# Patient Record
Sex: Male | Born: 1935
Health system: Southern US, Community
[De-identification: ages and names within clinical notes are randomized; demographics above are authoritative.]

## PROBLEM LIST (undated history)

## (undated) DIAGNOSIS — E785 Hyperlipidemia, unspecified: Secondary | ICD-10-CM

## (undated) DIAGNOSIS — Z87891 Personal history of nicotine dependence: Secondary | ICD-10-CM

## (undated) DIAGNOSIS — I10 Essential (primary) hypertension: Secondary | ICD-10-CM

## (undated) DIAGNOSIS — I2510 Atherosclerotic heart disease of native coronary artery without angina pectoris: Secondary | ICD-10-CM

## (undated) DIAGNOSIS — Z8719 Personal history of other diseases of the digestive system: Secondary | ICD-10-CM

## (undated) DIAGNOSIS — E663 Overweight: Secondary | ICD-10-CM

## (undated) HISTORY — DX: Hyperlipidemia, unspecified: E78.5

## (undated) HISTORY — DX: Personal history of nicotine dependence: Z87.891

## (undated) HISTORY — PX: CATARACT EXTRACTION: SUR2

## (undated) HISTORY — DX: Atherosclerotic heart disease of native coronary artery without angina pectoris: I25.10

## (undated) HISTORY — DX: Essential (primary) hypertension: I10

## (undated) HISTORY — DX: Personal history of other diseases of the digestive system: Z87.19

## (undated) HISTORY — DX: Overweight: E66.3

---

## 1989-06-23 HISTORY — PX: TENDON REPAIR: SHX5111

## 2002-09-12 HISTORY — PX: OTHER SURGICAL HISTORY: SHX169

## 2006-06-23 DIAGNOSIS — Z8719 Personal history of other diseases of the digestive system: Secondary | ICD-10-CM

## 2006-06-23 HISTORY — DX: Personal history of other diseases of the digestive system: Z87.19

## 2007-04-28 ENCOUNTER — Observation Stay (HOSPITAL_COMMUNITY): Admission: EM | Admit: 2007-04-28 | Discharge: 2007-04-29 | Payer: Self-pay | Admitting: Emergency Medicine

## 2007-04-30 ENCOUNTER — Ambulatory Visit: Payer: Self-pay | Admitting: Internal Medicine

## 2007-06-11 ENCOUNTER — Ambulatory Visit: Payer: Self-pay | Admitting: Cardiology

## 2008-06-23 HISTORY — PX: CAROTID ENDARTERECTOMY: SUR193

## 2008-07-04 ENCOUNTER — Ambulatory Visit: Payer: Self-pay | Admitting: Cardiology

## 2008-08-08 ENCOUNTER — Ambulatory Visit: Payer: Self-pay | Admitting: Vascular Surgery

## 2008-08-16 ENCOUNTER — Ambulatory Visit: Payer: Self-pay | Admitting: Vascular Surgery

## 2008-08-16 ENCOUNTER — Ambulatory Visit: Payer: Self-pay | Admitting: Internal Medicine

## 2008-08-23 ENCOUNTER — Inpatient Hospital Stay: Payer: Self-pay | Admitting: Vascular Surgery

## 2011-04-01 LAB — DIFFERENTIAL
Basophils Absolute: 0.2 — ABNORMAL HIGH
Basophils Relative: 2 — ABNORMAL HIGH
Eosinophils Relative: 0
Lymphocytes Relative: 5 — ABNORMAL LOW
Lymphs Abs: 0.6 — ABNORMAL LOW
Monocytes Absolute: 0.3
Monocytes Relative: 3

## 2011-04-01 LAB — COMPREHENSIVE METABOLIC PANEL
AST: 24
Alkaline Phosphatase: 49
Chloride: 109
GFR calc Af Amer: >60
Sodium: 142
Total Protein: 5.9 — ABNORMAL LOW

## 2011-04-01 LAB — TYPE AND SCREEN
ABO/RH(D): A NEG
Antibody Screen: NEGATIVE

## 2011-04-01 LAB — CBC
HCT: 32.7 — ABNORMAL LOW
HCT: 39.4
Hemoglobin: 11.4 — ABNORMAL LOW
Hemoglobin: 13.6
MCHC: 34.4
MCHC: 34.9
Platelets: 232
RDW: 12.4
WBC: 12.3 — ABNORMAL HIGH
WBC: 9.2

## 2011-04-01 LAB — HEMOGLOBIN AND HEMATOCRIT, BLOOD
HCT: 31.6 — ABNORMAL LOW
HCT: 37.1 — ABNORMAL LOW
Hemoglobin: 11.1 — ABNORMAL LOW
Hemoglobin: 12.8 — ABNORMAL LOW

## 2011-04-01 LAB — PROTIME-INR: INR: 1.2

## 2014-06-12 DIAGNOSIS — Z9889 Other specified postprocedural states: Secondary | ICD-10-CM | POA: Insufficient documentation

## 2014-07-06 DIAGNOSIS — Z961 Presence of intraocular lens: Secondary | ICD-10-CM | POA: Diagnosis not present

## 2014-08-21 DIAGNOSIS — H3532 Exudative age-related macular degeneration: Secondary | ICD-10-CM | POA: Diagnosis not present

## 2014-11-27 DIAGNOSIS — H3532 Exudative age-related macular degeneration: Secondary | ICD-10-CM | POA: Diagnosis not present

## 2015-01-05 DIAGNOSIS — H3532 Exudative age-related macular degeneration: Secondary | ICD-10-CM | POA: Diagnosis not present

## 2015-01-29 DIAGNOSIS — E663 Overweight: Secondary | ICD-10-CM | POA: Insufficient documentation

## 2015-01-29 DIAGNOSIS — I10 Essential (primary) hypertension: Secondary | ICD-10-CM | POA: Insufficient documentation

## 2015-01-29 DIAGNOSIS — I2510 Atherosclerotic heart disease of native coronary artery without angina pectoris: Secondary | ICD-10-CM | POA: Insufficient documentation

## 2015-02-12 ENCOUNTER — Encounter: Payer: Self-pay | Admitting: Family Medicine

## 2015-02-12 ENCOUNTER — Ambulatory Visit (INDEPENDENT_AMBULATORY_CARE_PROVIDER_SITE_OTHER): Payer: Medicare Other | Admitting: Family Medicine

## 2015-02-12 VITALS — BP 166/81 | HR 59 | Temp 97.1°F | Ht 69.5 in | Wt 175.0 lb

## 2015-02-12 DIAGNOSIS — Z7189 Other specified counseling: Secondary | ICD-10-CM | POA: Insufficient documentation

## 2015-02-12 DIAGNOSIS — W57XXXA Bitten or stung by nonvenomous insect and other nonvenomous arthropods, initial encounter: Secondary | ICD-10-CM

## 2015-02-12 DIAGNOSIS — Z5181 Encounter for therapeutic drug level monitoring: Secondary | ICD-10-CM | POA: Insufficient documentation

## 2015-02-12 DIAGNOSIS — T148 Other injury of unspecified body region: Secondary | ICD-10-CM | POA: Diagnosis not present

## 2015-02-12 DIAGNOSIS — J301 Allergic rhinitis due to pollen: Secondary | ICD-10-CM | POA: Diagnosis not present

## 2015-02-12 DIAGNOSIS — I2510 Atherosclerotic heart disease of native coronary artery without angina pectoris: Secondary | ICD-10-CM

## 2015-02-12 DIAGNOSIS — I10 Essential (primary) hypertension: Secondary | ICD-10-CM

## 2015-02-12 NOTE — Assessment & Plan Note (Signed)
Patient had stent placed in 2004, one artery blocked; on statin and aspirin; pulse today 59; sees cardiologist yearly, Dr. Darrold Junker

## 2015-02-12 NOTE — Patient Instructions (Addendum)
Keep up the good work that you are doing  We'll check some labs today and let you know those results  Try to use PLAIN allergy medicine without the decongestant if needed Avoid: phenylephrine, phenylpropanolamine, and pseudoephredine  Try the DASH guidelines  Return in 6 months for follow-up DASH Eating Plan DASH stands for "Dietary Approaches to Stop Hypertension." The DASH eating plan is a healthy eating plan that has been shown to reduce high blood pressure (hypertension). Additional health benefits may include reducing the risk of type 2 diabetes mellitus, heart disease, and stroke. The DASH eating plan may also help with weight loss. WHAT DO I NEED TO KNOW ABOUT THE DASH EATING PLAN? For the DASH eating plan, you will follow these general guidelines:  Choose foods with a percent daily value for sodium of less than 5% (as listed on the food label).  Use salt-free seasonings or herbs instead of table salt or sea salt.  Check with your health care provider or pharmacist before using salt substitutes.  Eat lower-sodium products, often labeled as "lower sodium" or "no salt added."  Eat fresh foods.  Eat more vegetables, fruits, and low-fat dairy products.  Choose whole grains. Look for the word "whole" as the first word in the ingredient list.  Choose fish and skinless chicken or [place] more often than red meat. Limit fish, poultry, and meat to 6 oz (170 g) each day.  Limit sweets, desserts, sugars, and sugary drinks.  Choose heart-healthy fats.  Limit cheese to 1 oz (28 g) per day.  Eat more home-cooked food and less restaurant, buffet, and fast food.  Limit fried foods.  Cook foods using methods other than frying.  Limit canned vegetables. If you do use them, rinse them well to decrease the sodium.  When eating at a restaurant, ask that your food be prepared with less salt, or no salt if possible. WHAT FOODS CAN I EAT? Seek help from a dietitian for individual calorie  needs. Grains Whole grain or whole wheat bread. Brown rice. Whole grain or whole wheat pasta. Quinoa, bulgur, and whole grain cereals. Low-sodium cereals. Corn or whole wheat flour tortillas. Whole grain cornbread. Whole grain crackers. Low-sodium crackers. Vegetables Fresh or frozen vegetables (raw, steamed, roasted, or grilled). Low-sodium or reduced-sodium tomato and vegetable juices. Low-sodium or reduced-sodium tomato sauce and paste. Low-sodium or reduced-sodium canned vegetables.  Fruits All fresh, canned (in natural juice), or frozen fruits. Meat and Other Protein Products Ground beef (85% or leaner), grass-fed beef, or beef trimmed of fat. Skinless chicken or [place]. Ground chicken or [place]. Pork trimmed of fat. All fish and seafood. Eggs. Dried beans, peas, or lentils. Unsalted nuts and seeds. Unsalted canned beans. Dairy Low-fat dairy products, such as skim or 1% milk, 2% or reduced-fat cheeses, low-fat ricotta or cottage cheese, or plain low-fat yogurt. Low-sodium or reduced-sodium cheeses. Fats and Oils Tub margarines without trans fats. Light or reduced-fat mayonnaise and salad dressings (reduced sodium). Avocado. Safflower, olive, or canola oils. Natural peanut or almond butter. Other Unsalted popcorn and pretzels. The items listed above may not be a complete list of recommended foods or beverages. Contact your dietitian for more options. WHAT FOODS ARE NOT RECOMMENDED? Grains White bread. White pasta. White rice. Refined cornbread. Bagels and croissants. Crackers that contain trans fat. Vegetables Creamed or fried vegetables. Vegetables in a cheese sauce. Regular canned vegetables. Regular canned tomato sauce and paste. Regular tomato and vegetable juices. Fruits Dried fruits. Canned fruit in light or heavy syrup.  Fruit juice. Meat and Other Protein Products Fatty cuts of meat. Ribs, chicken wings, bacon, sausage, bologna, salami, chitterlings, fatback, hot dogs, bratwurst,  and packaged luncheon meats. Salted nuts and seeds. Canned beans with salt. Dairy Whole or 2% milk, cream, half-and-half, and cream cheese. Whole-fat or sweetened yogurt. Full-fat cheeses or blue cheese. Nondairy creamers and whipped toppings. Processed cheese, cheese spreads, or cheese curds. Condiments Onion and garlic salt, seasoned salt, table salt, and sea salt. Canned and packaged gravies. Worcestershire sauce. Tartar sauce. Barbecue sauce. Teriyaki sauce. Soy sauce, including reduced sodium. Steak sauce. Fish sauce. Oyster sauce. Cocktail sauce. Horseradish. Ketchup and mustard. Meat flavorings and tenderizers. Bouillon cubes. Hot sauce. Tabasco sauce. Marinades. Taco seasonings. Relishes. Fats and Oils Butter, stick margarine, lard, shortening, ghee, and bacon fat. Coconut, palm kernel, or palm oils. Regular salad dressings. Other Pickles and olives. Salted popcorn and pretzels. The items listed above may not be a complete list of foods and beverages to avoid. Contact your dietitian for more information. WHERE CAN I FIND MORE INFORMATION? National Heart, Lung, and Blood Institute: travelstabloid.com Document Released: 05/29/2011 Document Revised: 10/24/2013 Document Reviewed: 04/13/2013 [facility] Patient Information 2015 [place], [place]. This information is not intended to replace advice given to you by your health care provider. Make sure you discuss any questions you have with your health care provider.

## 2015-02-12 NOTE — Assessment & Plan Note (Addendum)
Well-controlled at home; continue current plan; monitor BP away from doctor's office, goes up at the doctor; limit salt; follow DASH guidelines

## 2015-02-12 NOTE — Progress Notes (Signed)
BP 166/81 mmHg  Pulse 59  Temp(Src) 97.1 F (36.2 C)  Ht 5' 9.5" (1.765 m)  Wt 175 lb (79.379 kg)  BMI 25.48 kg/m2  SpO2 99%   Subjective:    Patient ID: Nicholas Gross, male    DOB: 01-18-1928, 81 y.o.   MRN: [id number]  HPI: Nicholas Gross is a 81 y.o. male  Chief Complaint  Patient presents with  . Hyperlipidemia  . Hypertension   He has allergies; doesn't take anything; allergic to pollen, [place]  High blood pressure; he checks it at home every morning; 124/68 on Saturday; usually in the 110's over 60's; he feels like it is well-controlled; does push-ups and squats every morning; he puts light salt on his food; on nothing for blood pressure  High cholesterol; on statin; eating sausage in the mornings, only meat he eats except for a steak biscuit; eating good vegetables, "about all I eat" are veggies  Coronary artery disease; sees Dr. Darrold Junker yearly; on statin, fish oil, aspirin; no chest pain; no leg edema  He has lost some muscle mass; some occasional arthritis in hips; grandfather had crippling arthritis; father had arthritis in knees and hips; sister has had both knees operated on; another sister had a knee that is worn out  Relevant past medical, surgical, family and social history reviewed and updated as indicated. Interim medical history since our last visit reviewed. Allergies and medications reviewed and updated.  Review of Systems  Constitutional: Negative for fever.  HENT: Positive for postnasal drip and rhinorrhea. Negative for nosebleeds.   Respiratory: Negative for wheezing.   Cardiovascular: Negative for chest pain and leg swelling.  Gastrointestinal: Negative for blood in stool.  Endocrine: Negative for polydipsia.  Genitourinary: Negative for hematuria.  Musculoskeletal: Positive for arthralgias.  Skin:       Bites on his arms yesterday, got into something  Neurological: Negative for tremors.  Hematological: Bruises/bleeds easily  (on aspirin and fish oil).  Psychiatric/Behavioral: Negative for sleep disturbance.   Per HPI unless specifically indicated above     Objective:    BP 166/81 mmHg  Pulse 59  Temp(Src) 97.1 F (36.2 C)  Ht 5' 9.5" (1.765 m)  Wt 175 lb (79.379 kg)  BMI 25.48 kg/m2  SpO2 99%  Wt Readings from Last 3 Encounters:  02/12/15 175 lb (79.379 kg)  06/19/14 179 lb (81.194 kg)    Physical Exam  Constitutional: He appears well-developed and well-nourished. No distress.  HENT:  Head: Normocephalic and atraumatic.  Eyes: EOM are normal. No scleral icterus.  Neck: No thyromegaly present.  Cardiovascular: Normal rate and regular rhythm.   Pulmonary/Chest: Effort normal and breath sounds normal.  Abdominal: Soft. Bowel sounds are normal. He exhibits no distension.  Musculoskeletal: He exhibits no edema.  Neurological: Coordination normal.  Skin: Skin is warm and dry. No pallor.  Multiple tiny pinpoint erythematous lesions on the arms consistent with insect bites; no drainage  Psychiatric: He has a normal mood and affect. His behavior is normal. Judgment and thought content normal.      Assessment & Plan:   Problem List Items Addressed This Visit      Cardiovascular and Mediastinum   Hypertension - Primary    Well-controlled at home; continue current plan; monitor BP away from doctor's office, goes up at the doctor; limit salt; follow DASH guidelines      CAD (coronary artery disease)    Patient had stent placed in 2004, one artery blocked;  on statin and aspirin; pulse today 59; sees cardiologist yearly, Dr. Darrold Junker      Relevant Orders   Lipid Panel w/o Chol/HDL Ratio     Respiratory   Allergic rhinitis due to pollen    Not using anything; if he does need something, avoid decongestants which can raise BP        Other   Medication monitoring encounter    Check SGPT on statin      Relevant Orders   Comprehensive metabolic panel    Other Visit Diagnoses    Multiple  insect bites        recommended insect repellent; since he [place] the woods and is outdoors, repel ticks and mosquitos       Follow up plan: Return in about 6 months (around 08/15/2015) for high cholesterol, blood pressure.  Orders Placed This Encounter  Procedures  . Comprehensive metabolic panel  . Lipid Panel w/o Chol/HDL Ratio

## 2015-02-12 NOTE — Assessment & Plan Note (Signed)
Check SGPT on statin 

## 2015-02-12 NOTE — Assessment & Plan Note (Signed)
Not using anything; if he does need something, avoid decongestants which can raise BP

## 2015-02-13 LAB — COMPREHENSIVE METABOLIC PANEL
ALT: 18 IU/L (ref 0–44)
AST: 23 IU/L (ref 0–40)
Albumin/Globulin Ratio: 2.8 — ABNORMAL HIGH (ref 1.1–2.5)
Albumin: 4.5 g/dL (ref 3.5–4.7)
Alkaline Phosphatase: 61 IU/L (ref 39–117)
BILIRUBIN TOTAL: 0.8 mg/dL (ref 0.0–1.2)
BUN/Creatinine Ratio: 9 — ABNORMAL LOW (ref 10–22)
BUN: 7 mg/dL — AB (ref 8–27)
CHLORIDE: 95 mmol/L — AB (ref 97–108)
CO2: 24 mmol/L (ref 18–29)
Calcium: 8.8 mg/dL (ref 8.6–10.2)
Creatinine, Ser: 0.8 mg/dL (ref 0.76–1.27)
GFR calc non Af Amer: 81 mL/min/{1.73_m2} (ref >59)
GFR, EST AFRICAN AMERICAN: 94 mL/min/{1.73_m2} (ref >59)
GLUCOSE: 96 mg/dL (ref 65–99)
Globulin, Total: 1.6 g/dL (ref 1.5–4.5)
Potassium: 4.3 mmol/L (ref 3.5–5.2)
Sodium: 134 mmol/L (ref 134–144)
TOTAL PROTEIN: 6.1 g/dL (ref 6.0–8.5)

## 2015-02-13 LAB — LIPID PANEL W/O CHOL/HDL RATIO
CHOLESTEROL TOTAL: 116 mg/dL (ref 100–199)
HDL: 44 mg/dL (ref >39)
LDL Calculated: 55 mg/dL (ref 0–99)
TRIGLYCERIDES: 87 mg/dL (ref 0–149)
VLDL CHOLESTEROL CAL: 17 mg/dL (ref 5–40)

## 2015-02-19 ENCOUNTER — Encounter: Payer: Self-pay | Admitting: Family Medicine

## 2015-04-16 DIAGNOSIS — H353222 Exudative age-related macular degeneration, left eye, with inactive choroidal neovascularization: Secondary | ICD-10-CM | POA: Diagnosis not present

## 2015-04-18 DIAGNOSIS — Z23 Encounter for immunization: Secondary | ICD-10-CM | POA: Diagnosis not present

## 2015-05-29 DIAGNOSIS — E7800 Pure hypercholesterolemia, unspecified: Secondary | ICD-10-CM | POA: Diagnosis not present

## 2015-05-29 DIAGNOSIS — I10 Essential (primary) hypertension: Secondary | ICD-10-CM | POA: Diagnosis not present

## 2015-05-29 DIAGNOSIS — I2510 Atherosclerotic heart disease of native coronary artery without angina pectoris: Secondary | ICD-10-CM | POA: Diagnosis not present

## 2015-05-29 DIAGNOSIS — Z9889 Other specified postprocedural states: Secondary | ICD-10-CM | POA: Diagnosis not present

## 2015-08-15 ENCOUNTER — Encounter: Payer: Self-pay | Admitting: Family Medicine

## 2015-08-15 ENCOUNTER — Ambulatory Visit (INDEPENDENT_AMBULATORY_CARE_PROVIDER_SITE_OTHER): Payer: Medicare Other | Admitting: Family Medicine

## 2015-08-15 ENCOUNTER — Telehealth: Payer: Self-pay | Admitting: Family Medicine

## 2015-08-15 VITALS — BP 149/79 | HR 60 | Temp 97.2°F | Ht 69.5 in | Wt 181.6 lb

## 2015-08-15 DIAGNOSIS — I6529 Occlusion and stenosis of unspecified carotid artery: Secondary | ICD-10-CM

## 2015-08-15 DIAGNOSIS — E663 Overweight: Secondary | ICD-10-CM | POA: Diagnosis not present

## 2015-08-15 DIAGNOSIS — I10 Essential (primary) hypertension: Secondary | ICD-10-CM

## 2015-08-15 DIAGNOSIS — I2510 Atherosclerotic heart disease of native coronary artery without angina pectoris: Secondary | ICD-10-CM | POA: Diagnosis not present

## 2015-08-15 DIAGNOSIS — H3530 Unspecified macular degeneration: Secondary | ICD-10-CM | POA: Diagnosis not present

## 2015-08-15 DIAGNOSIS — J301 Allergic rhinitis due to pollen: Secondary | ICD-10-CM | POA: Diagnosis not present

## 2015-08-15 NOTE — Assessment & Plan Note (Addendum)
Controlled today, goal systolic under 150 mmHg on no antihypertensives; pulse is 60 so not adding beta-blocker; follows with cardiologist

## 2015-08-15 NOTE — Assessment & Plan Note (Signed)
Typical seasonal allergies, trees and flowers already blooming discussed; no change in therapy

## 2015-08-15 NOTE — Patient Instructions (Addendum)
Try to limit saturated fats in your diet (bologna, hot dogs, barbeque, cheeseburgers, hamburgers, steak, bacon, sausage, cheese, etc.) and get more fresh fruits, vegetables, and whole grains We'll see you back in late August for fasting labs and a visit, and either Gabriel Cirri, NP or Dr. Olevia Perches will be happy to see you

## 2015-08-15 NOTE — Progress Notes (Signed)
BP 149/79 mmHg  Pulse 60  Temp(Src) 97.2 F (36.2 C)  Ht 5' 9.5" (1.765 m)  Wt 181 lb 9.6 oz (82.373 kg)  BMI 26.44 kg/m2  SpO2 100%   Subjective:    Patient ID: Nicholas Gross, male    DOB: 1927/12/01, 82 y.o.   MRN: [id number]  HPI: Nicholas Gross is a 82 y.o. male  Chief Complaint  Patient presents with  . Follow-up    Patient stated he doesn't have any problems besides his eyes  . Eye Problem    Occular Degeneration   Patient is here for follow-up No complaints today He would prefer to not get any bloodwork today; last set of labs reviewed and cholesterol panel was wonderful; Dr. Darrold Junker reviewed labs and agreed, things look great  High cholesterol; on statin; no problems; reviewed by his cardiologist in December who was pleased; Rx comes from cardiologist; he does eat eggs, heard they were good, once a day was recommended by doctor on TV Lab Results  Component Value Date   CHOL 116 02/12/2015   Lab Results  Component Value Date   HDL 44 02/12/2015   Lab Results  Component Value Date   LDLCALC 55 02/12/2015   Lab Results  Component Value Date   TRIG 87 02/12/2015   No results found for: CHOLHDL No results found for: LDLDIRECT  Macular degeneration; sees eye doctor next week; he does not drive to appointments, friend drives him; that is really his only limiting health problem  Hx of left carotid endarterectomy; on aspirin and statin  He's gained a little weight, eating too many sweets at times  Relevant past medical, surgical, family and social history reviewed and updated as indicated Past Medical History  Diagnosis Date  . Hypertension   . CAD (coronary artery disease)   . Hyperlipidemia   . Overweight   . History of tobacco use     17 pack years, quit around 1970  . History of GI bleed 2008  tells me a story of how he almost died at age 77 from urinary blockage  Past Surgical History  Procedure Laterality Date  . Cataract extraction     . Tendon repair  1991    finger and arm  . Carotid endarterectomy Left 2010  . Cypher stent  09/12/02    s/p cypher stent mid-LAD   Family History  Problem Relation Age of Onset  . Heart disease Father     possibly  . Cancer Sister     breast  . AAA (abdominal aortic aneurysm) Brother   . Cancer Sister     lung   Social History  Substance Use Topics  . Smoking status: Former Smoker -- 1.00 packs/day for 17 years    Types: Cigarettes    Quit date: 06/23/1968  . Smokeless tobacco: Never Used  . Alcohol Use: No   Interim medical history since our last visit reviewed. Saw cardiologist in December; Dr. Darrold Junker; put in his coronary artery stent back in 2004  Allergies and medications reviewed and updated.  Review of Systems  Constitutional: Positive for unexpected weight change (little bit of gain; not sure why, too many sweets).  Eyes: Positive for visual disturbance (can't see like he used to; Dr. Duke Salvia).  Respiratory: Positive for cough (just little bit with this time of year with sneezing and runny nose, allergies; has always had this).   Cardiovascular: Negative for chest pain and leg swelling.  Gastrointestinal: Negative for blood in  stool.  Genitourinary: Negative for hematuria.  Musculoskeletal: Positive for arthralgias (arthritis).  Hematological: Bruises/bleeds easily (bruises easily from meds; no easy bleeding).   Per HPI unless specifically indicated above     Objective:    BP 149/79 mmHg  Pulse 60  Temp(Src) 97.2 F (36.2 C)  Ht 5' 9.5" (1.765 m)  Wt 181 lb 9.6 oz (82.373 kg)  BMI 26.44 kg/m2  SpO2 100%  Wt Readings from Last 3 Encounters:  08/15/15 181 lb 9.6 oz (82.373 kg)  02/12/15 175 lb (79.379 kg)  06/19/14 179 lb (81.194 kg)    Physical Exam  Constitutional: He appears well-developed and well-nourished. No distress.  Weight gain 6+ pounds over last 6 months  HENT:  Head: Normocephalic and atraumatic.  Eyes: EOM are normal. No scleral  icterus.  Neck: No JVD present. Carotid bruit is not present. No thyromegaly present.  Cardiovascular: Normal rate and regular rhythm.   Pulmonary/Chest: Effort normal and breath sounds normal.  Abdominal: Soft. He exhibits no distension.  Musculoskeletal: He exhibits no edema.  Neurological: Coordination normal.  Skin: Skin is warm and dry. No bruising and no ecchymosis noted. No pallor.  Psychiatric: He has a normal mood and affect. His behavior is normal. Judgment and thought content normal.   Results for orders placed or performed in visit on 02/12/15  Comprehensive metabolic panel  Result Value Ref Range   Glucose 96 65 - 99 mg/dL   BUN 7 (L) 8 - 27 mg/dL   Creatinine, Ser 0.98 0.76 - 1.27 mg/dL   GFR calc non Af Amer 81 >59 mL/min/1.73   GFR calc Af Amer 94 >59 mL/min/1.73   BUN/Creatinine Ratio 9 (L) 10 - 22   Sodium 134 134 - 144 mmol/L   Potassium 4.3 3.5 - 5.2 mmol/L   Chloride 95 (L) 97 - 108 mmol/L   CO2 24 18 - 29 mmol/L   Calcium 8.8 8.6 - 10.2 mg/dL   Total Protein 6.1 6.0 - 8.5 g/dL   Albumin 4.5 3.5 - 4.7 g/dL   Globulin, Total 1.6 1.5 - 4.5 g/dL   Albumin/Globulin Ratio 2.8 (H) 1.1 - 2.5   Bilirubin Total 0.8 0.0 - 1.2 mg/dL   Alkaline Phosphatase 61 39 - 117 IU/L   AST 23 0 - 40 IU/L   ALT 18 0 - 44 IU/L  Lipid Panel w/o Chol/HDL Ratio  Result Value Ref Range   Cholesterol, Total 116 100 - 199 mg/dL   Triglycerides 87 0 - 149 mg/dL   HDL 44 >11 mg/dL   VLDL Cholesterol Cal 17 5 - 40 mg/dL   LDL Calculated 55 0 - 99 mg/dL      Assessment & Plan:   Problem List Items Addressed This Visit      Cardiovascular and Mediastinum   Hypertension - Primary    Controlled today, goal systolic under 150 mmHg on no antihypertensives; pulse is 60 so not adding beta-blocker; follows with cardiologist      CAD (coronary artery disease)    Last seen by Dr. Darrold Junker 2 months ago; reviewed last LDL, 55 (excellent control); continue aspirin; asymptomatic       Carotid atherosclerosis    Left CEA in 2010; no bruits heard today but will order carotid US for monitoring      Relevant Orders   US Carotid Duplex Bilateral     Respiratory   Allergic rhinitis due to pollen    Typical seasonal allergies, trees and flowers already blooming discussed; no  change in therapy        Other   Overweight    Very modest weight gain; not enough to suspect thyroid dysfunction; previous weights reviewed and he appears to gain a little in the winter and lose a little in the summer      Macular degeneration    Continue f/u with eye doctor (next week); on ocular vitamins; nonsmoker         Follow up plan: Return in about 6 months (around 02/13/2016) for thirty minute follow-up with fasting labs.   Orders Placed This Encounter  Procedures  . US Carotid Duplex Bilateral   An after-visit summary was printed and given to the patient at check-out.  Please see the patient instructions which may contain other information and recommendations beyond what is mentioned above in the assessment and plan.

## 2015-08-15 NOTE — Assessment & Plan Note (Signed)
Very modest weight gain; not enough to suspect thyroid dysfunction; previous weights reviewed and he appears to gain a little in the winter and lose a little in the summer

## 2015-08-15 NOTE — Telephone Encounter (Signed)
Please let patient know that I'd like to get an ultrasound of his carotids for monitoring to make sure there are no blockages coming back on the left or developing on the right Thank you

## 2015-08-15 NOTE — Assessment & Plan Note (Signed)
Continue f/u with eye doctor (next week); on ocular vitamins; nonsmoker

## 2015-08-15 NOTE — Assessment & Plan Note (Signed)
Left CEA in 2010; no bruits heard today but will order carotid US for monitoring

## 2015-08-15 NOTE — Assessment & Plan Note (Signed)
Last seen by Dr. Darrold Junker 2 months ago; reviewed last LDL, 55 (excellent control); continue aspirin; asymptomatic

## 2015-08-16 NOTE — Telephone Encounter (Signed)
Patient states that he doesn't think he needs one, he states that he follows with Dr.Dew and will wait for Dr.Dew to order it

## 2015-08-16 NOTE — Telephone Encounter (Signed)
That's fine; I'll work with Laurelyn Sickle to cancel the order I added Dr. Wyn Quaker to the care team and will add info to overview in problem list

## 2015-08-20 DIAGNOSIS — H353222 Exudative age-related macular degeneration, left eye, with inactive choroidal neovascularization: Secondary | ICD-10-CM | POA: Diagnosis not present

## 2015-11-23 DIAGNOSIS — H353222 Exudative age-related macular degeneration, left eye, with inactive choroidal neovascularization: Secondary | ICD-10-CM | POA: Diagnosis not present

## 2016-02-13 ENCOUNTER — Ambulatory Visit (INDEPENDENT_AMBULATORY_CARE_PROVIDER_SITE_OTHER): Payer: Medicare Other | Admitting: Family Medicine

## 2016-02-13 ENCOUNTER — Encounter: Payer: Self-pay | Admitting: Family Medicine

## 2016-02-13 VITALS — BP 135/63 | HR 56 | Temp 97.9°F | Wt 180.0 lb

## 2016-02-13 DIAGNOSIS — E785 Hyperlipidemia, unspecified: Secondary | ICD-10-CM

## 2016-02-13 DIAGNOSIS — E663 Overweight: Secondary | ICD-10-CM | POA: Diagnosis not present

## 2016-02-13 DIAGNOSIS — I10 Essential (primary) hypertension: Secondary | ICD-10-CM

## 2016-02-13 DIAGNOSIS — I2510 Atherosclerotic heart disease of native coronary artery without angina pectoris: Secondary | ICD-10-CM

## 2016-02-13 LAB — LIPID PANEL PICCOLO, WAIVED
Chol/HDL Ratio Piccolo,Waive: 3 mg/dL
Cholesterol Piccolo, Waived: 123 mg/dL (ref <200)
HDL CHOL PICCOLO, WAIVED: 41 mg/dL — AB (ref >59)
LDL CHOL CALC PICCOLO WAIVED: 59 mg/dL (ref <100)
Triglycerides Piccolo,Waived: 114 mg/dL (ref <150)
VLDL CHOL CALC PICCOLO,WAIVE: 23 mg/dL (ref <30)

## 2016-02-13 LAB — MICROALBUMIN, URINE WAIVED
Creatinine, Urine Waived: 10 mg/dL (ref 10–300)
MICROALB, UR WAIVED: 10 mg/L (ref 0–19)

## 2016-02-13 LAB — BAYER DCA HB A1C WAIVED: HB A1C (BAYER DCA - WAIVED): 5.4 % (ref <7.0)

## 2016-02-13 MED ORDER — SIMVASTATIN 20 MG PO TABS: 10.0000 mg | ORAL_TABLET | Freq: Every day | ORAL | 3 refills | Status: DC

## 2016-02-13 MED ORDER — ASPIRIN EC 81 MG PO TBEC: 81.0000 mg | DELAYED_RELEASE_TABLET | Freq: Every day | ORAL | 12 refills | Status: DC

## 2016-02-13 NOTE — Assessment & Plan Note (Signed)
Under good control on recheck Continue current regimen. Continue to monitor. Recheck 6 months at physical.

## 2016-02-13 NOTE — Assessment & Plan Note (Signed)
Checking labs today. Await results.  

## 2016-02-13 NOTE — Progress Notes (Signed)
BP 135/63   Pulse (!) 56   Temp 97.9 F (36.6 C)   Wt 180 lb (81.6 kg)   SpO2 97%   BMI 26.20 kg/m    Subjective:    Patient ID: Nicholas Gross, male    DOB: 1928-02-15, 82 y.o.   MRN: [id number]  HPI: Nicholas Gross is a 82 y.o. male  Chief Complaint  Patient presents with  . Hyperlipidemia   HYPERLIPIDEMIA Hyperlipidemia status: excellent compliance Satisfied with current treatment?  yes Side effects:  no Medication compliance: excellent compliance Past cholesterol meds: simvastatin Supplements: fish oil Aspirin:  yes Chest pain:  no Coronary artery disease:  yes Family history CAD: yes  Relevant past medical, surgical, family and social history reviewed and updated as indicated. Interim medical history since our last visit reviewed. Allergies and medications reviewed and updated.  Review of Systems  Constitutional: Negative.   Respiratory: Negative.   Cardiovascular: Negative.   Musculoskeletal: Positive for myalgias (tight neck and shoulder with some tingling in arm).  Psychiatric/Behavioral: Negative.     Per HPI unless specifically indicated above     Objective:    BP 135/63   Pulse (!) 56   Temp 97.9 F (36.6 C)   Wt 180 lb (81.6 kg)   SpO2 97%   BMI 26.20 kg/m   Wt Readings from Last 3 Encounters:  02/13/16 180 lb (81.6 kg)  08/15/15 181 lb 9.6 oz (82.4 kg)  02/12/15 175 lb (79.4 kg)    Physical Exam  Constitutional: He is oriented to person, place, and time. He appears well-developed and well-nourished. No distress.  HENT:  Head: Normocephalic and atraumatic.  Right Ear: Hearing and external ear normal.  Left Ear: Hearing and external ear normal.  Nose: Nose normal.  Eyes: Conjunctivae and lids are normal. Right eye exhibits no discharge. Left eye exhibits no discharge. No scleral icterus.  Cardiovascular: Normal rate, regular rhythm, normal heart sounds and intact distal pulses.  Exam reveals no gallop and no friction rub.   No  murmur heard. Pulmonary/Chest: Effort normal and breath sounds normal. No respiratory distress. He has no wheezes. He has no rales. He exhibits no tenderness.  Musculoskeletal: Normal range of motion.  + Spurlings on the L, trap spasm on the L  Neurological: He is alert and oriented to person, place, and time.  Skin: Skin is warm, dry and intact. No rash noted. He is not diaphoretic. No erythema. No pallor.  Psychiatric: He has a normal mood and affect. His speech is normal and behavior is normal. Judgment and thought content normal. Cognition and memory are normal.  Nursing note and vitals reviewed.   Results for orders placed or performed in visit on 02/12/15  Comprehensive metabolic panel  Result Value Ref Range   Glucose 96 65 - 99 mg/dL   BUN 7 (L) 8 - 27 mg/dL   Creatinine, Ser 1.190.80 0.76 - 1.27 mg/dL   GFR calc non Af Amer 81 >59 mL/min/1.73   GFR calc Af Amer 94 >59 mL/min/1.73   BUN/Creatinine Ratio 9 (L) 10 - 22   Sodium 134 134 - 144 mmol/L   Potassium 4.3 3.5 - 5.2 mmol/L   Chloride 95 (L) 97 - 108 mmol/L   CO2 24 18 - 29 mmol/L   Calcium 8.8 8.6 - 10.2 mg/dL   Total Protein 6.1 6.0 - 8.5 g/dL   Albumin 4.5 3.5 - 4.7 g/dL   Globulin, Total 1.6 1.5 - 4.5 g/dL  Albumin/Globulin Ratio 2.8 (H) 1.1 - 2.5   Bilirubin Total 0.8 0.0 - 1.2 mg/dL   Alkaline Phosphatase 61 39 - 117 IU/L   AST 23 0 - 40 IU/L   ALT 18 0 - 44 IU/L  Lipid Panel w/o Chol/HDL Ratio  Result Value Ref Range   Cholesterol, Total 116 100 - 199 mg/dL   Triglycerides 87 0 - 149 mg/dL   HDL 44 >16>39 mg/dL   VLDL Cholesterol Cal 17 5 - 40 mg/dL   LDL Calculated 55 0 - 99 mg/dL      Assessment & Plan:   Problem List Items Addressed This Visit      Cardiovascular and Mediastinum   Hypertension - Primary    Under good control on recheck Continue current regimen. Continue to monitor. Recheck 6 months at physical.       Relevant Medications   simvastatin (ZOCOR) 20 MG tablet   aspirin EC 81 MG tablet     Other Relevant Orders   Comprehensive metabolic panel   Microalbumin, Urine Waived   CAD (coronary artery disease)    No pain. Follows with Dr. Cassie FreerParachos. Continue to follow with him. Continue current regimen.       Relevant Medications   simvastatin (ZOCOR) 20 MG tablet   aspirin EC 81 MG tablet   Other Relevant Orders   Bayer DCA Hb A1c Waived   Comprehensive metabolic panel   Lipid Panel Piccolo, Waived     Other   Overweight    Checking labs today. Await results.       Relevant Orders   Bayer DCA Hb A1c Waived   Comprehensive metabolic panel   TSH   Hyperlipemia    Under good control. Continue current regimen. Continue to monitor. Recheck 6 months at physical.       Relevant Medications   simvastatin (ZOCOR) 20 MG tablet   aspirin EC 81 MG tablet    Other Visit Diagnoses   None.      Follow up plan: Return in about 6 months (around 08/15/2016) for Wellness.

## 2016-02-13 NOTE — Assessment & Plan Note (Signed)
No pain. Follows with Dr. Cassie FreerParachos. Continue to follow with him. Continue current regimen.

## 2016-02-13 NOTE — Assessment & Plan Note (Signed)
Under good control. Continue current regimen. Continue to monitor. Recheck 6 months at physical. 

## 2016-02-14 ENCOUNTER — Encounter: Payer: Self-pay | Admitting: Family Medicine

## 2016-02-14 LAB — COMPREHENSIVE METABOLIC PANEL
A/G RATIO: 2.2 (ref 1.2–2.2)
ALBUMIN: 4.3 g/dL (ref 3.5–4.7)
ALT: 16 IU/L (ref 0–44)
AST: 26 IU/L (ref 0–40)
Alkaline Phosphatase: 57 IU/L (ref 39–117)
BILIRUBIN TOTAL: 1 mg/dL (ref 0.0–1.2)
BUN / CREAT RATIO: 7 — AB (ref 10–24)
BUN: 6 mg/dL — ABNORMAL LOW (ref 8–27)
CHLORIDE: 97 mmol/L (ref 96–106)
CO2: 22 mmol/L (ref 18–29)
Calcium: 9.2 mg/dL (ref 8.6–10.2)
Creatinine, Ser: 0.87 mg/dL (ref 0.76–1.27)
GFR calc non Af Amer: 78 mL/min/{1.73_m2} (ref >59)
GFR, EST AFRICAN AMERICAN: 90 mL/min/{1.73_m2} (ref >59)
Globulin, Total: 2 g/dL (ref 1.5–4.5)
Glucose: 93 mg/dL (ref 65–99)
POTASSIUM: 4.5 mmol/L (ref 3.5–5.2)
Sodium: 133 mmol/L — ABNORMAL LOW (ref 134–144)
TOTAL PROTEIN: 6.3 g/dL (ref 6.0–8.5)

## 2016-02-14 LAB — TSH: TSH: 2.37 u[IU]/mL (ref 0.450–4.500)

## 2016-03-28 DIAGNOSIS — H353114 Nonexudative age-related macular degeneration, right eye, advanced atrophic with subfoveal involvement: Secondary | ICD-10-CM | POA: Diagnosis not present

## 2016-05-29 DIAGNOSIS — I10 Essential (primary) hypertension: Secondary | ICD-10-CM | POA: Diagnosis not present

## 2016-05-29 DIAGNOSIS — E7800 Pure hypercholesterolemia, unspecified: Secondary | ICD-10-CM | POA: Diagnosis not present

## 2016-05-29 DIAGNOSIS — Z9889 Other specified postprocedural states: Secondary | ICD-10-CM | POA: Diagnosis not present

## 2016-08-15 ENCOUNTER — Encounter: Payer: Self-pay | Admitting: Family Medicine

## 2016-08-21 ENCOUNTER — Telehealth: Payer: Self-pay | Admitting: Family Medicine

## 2016-08-21 ENCOUNTER — Encounter: Payer: Self-pay | Admitting: Family Medicine

## 2016-08-21 ENCOUNTER — Ambulatory Visit (INDEPENDENT_AMBULATORY_CARE_PROVIDER_SITE_OTHER): Payer: Medicare Other | Admitting: Family Medicine

## 2016-08-21 VITALS — BP 138/68 | HR 57 | Temp 98.6°F | Resp 17 | Ht 69.1 in | Wt 176.0 lb

## 2016-08-21 DIAGNOSIS — E782 Mixed hyperlipidemia: Secondary | ICD-10-CM | POA: Diagnosis not present

## 2016-08-21 DIAGNOSIS — Z0000 Encounter for general adult medical examination without abnormal findings: Secondary | ICD-10-CM

## 2016-08-21 DIAGNOSIS — I2510 Atherosclerotic heart disease of native coronary artery without angina pectoris: Secondary | ICD-10-CM | POA: Diagnosis not present

## 2016-08-21 DIAGNOSIS — H3530 Unspecified macular degeneration: Secondary | ICD-10-CM | POA: Diagnosis not present

## 2016-08-21 DIAGNOSIS — I10 Essential (primary) hypertension: Secondary | ICD-10-CM

## 2016-08-21 MED ORDER — SIMVASTATIN 20 MG PO TABS: 10.0000 mg | ORAL_TABLET | Freq: Every day | ORAL | 3 refills | Status: DC

## 2016-08-21 NOTE — Assessment & Plan Note (Signed)
Continue to follow with opthalmology. Call with any concerns.  

## 2016-08-21 NOTE — Assessment & Plan Note (Signed)
Better on recheck. Risks of falls outweigh benefits. Call with any concerns. Recheck 3 months.  

## 2016-08-21 NOTE — Telephone Encounter (Signed)
[facility] pharmacist needs clarification on the simvastatin.  He just needs to make sure that he should be taking the 10mg  tab daily instead of 20mg  daily  Homero FellersFrank  [phone]  Thanks

## 2016-08-21 NOTE — Assessment & Plan Note (Signed)
Rechecking levels today. Continue every other day simvastatin. Call with any concerns.

## 2016-08-21 NOTE — Assessment & Plan Note (Signed)
Continue to follow with cardiology. Stable. Call with any concerns.  ?

## 2016-08-21 NOTE — Progress Notes (Signed)
BP 138/68   Pulse (!) 57   Temp 98.6 F (37 C) (Oral)   Resp 17   Ht 5' 9.1" (1.755 m)   Wt 176 lb (79.8 kg)   SpO2 100%   BMI 25.92 kg/m    Subjective:    Patient ID: Nicholas Gross, male    DOB: 11-18-1927, 83 y.o.   MRN: [id number]  HPI: Nicholas Gross is a 83 y.o. male presenting on 08/21/2016 for comprehensive medical examination. Current medical complaints include:  HYPERTENSION / HYPERLIPIDEMIA Satisfied with current treatment? yes Duration of hypertension: chronic BP monitoring frequency: not checking BP medication side effects: no Duration of hyperlipidemia: chronic Cholesterol medication side effects: yes- every other day Cholesterol supplements: fish oil Past cholesterol medications: simvastatin Medication compliance: excellent compliance Aspirin: yes Recent stressors: no Recurrent headaches: no Visual changes: no Palpitations: no Dyspnea: no Chest pain: no Lower extremity edema: no Dizzy/lightheaded: no  He currently lives with: alone Interim Problems from his last visit: no  Functional Status Survey: Is the patient deaf or have difficulty hearing?: Yes Does the patient have difficulty seeing, even when wearing glasses/contacts?: Yes Does the patient have difficulty concentrating, remembering, or making decisions?: Yes Does the patient have difficulty walking or climbing stairs?: No Does the patient have difficulty dressing or bathing?: No Does the patient have difficulty doing errands alone such as visiting a doctor's office or shopping?: Yes  FALL RISK: Fall Risk  08/21/2016 08/15/2015  Falls in the past year? No No    Depression Screen Depression screen [facility] 2/9 08/21/2016 08/15/2015  Decreased Interest 0 0  Down, Depressed, Hopeless 0 0  PHQ - 2 Score 0 0   Advanced Directives See appropriate area of the chart  Past Medical History:  Past Medical History:  Diagnosis Date  . CAD (coronary artery disease)   . History of GI bleed 2008  .  History of tobacco use    17 pack years, quit around 1970  . Hyperlipidemia   . Hypertension   . Overweight     Surgical History:  Past Surgical History:  Procedure Laterality Date  . CAROTID ENDARTERECTOMY Left 2010  . CATARACT EXTRACTION    . cypher stent  09/12/02   s/p cypher stent mid-LAD  . TENDON REPAIR  1991   finger and arm    Medications:  Current Outpatient Prescriptions on File Prior to Visit  Medication Sig  . aspirin EC 81 MG tablet Take 1 tablet (81 mg total) by mouth daily.  . Multiple Vitamins-Minerals (PRESERVISION AREDS PO) Take by mouth 2 (two) times daily.  . Omega-3 Fatty Acids (FISH OIL PO) Take by mouth daily.   No current facility-administered medications on file prior to visit.     Allergies:  Allergies  Allergen Reactions  . Other     Patient states that he is allergic to something that they place in the IV before they work on you    Social History:  Social History   Social History  . Marital status: Widowed    Spouse name: N/A  . Number of children: N/A  . Years of education: N/A   Occupational History  . Not on file.   Social History Main Topics  . Smoking status: Former Smoker    Packs/day: 1.00    Years: 17.00    Types: Cigarettes    Quit date: 06/23/1968  . Smokeless tobacco: Never Used  . Alcohol use No  . Drug use: No  . Sexual  activity: Not on file   Other Topics Concern  . Not on file   Social History Narrative  . No narrative on file   History  Smoking Status  . Former Smoker  . Packs/day: 1.00  . Years: 17.00  . Types: Cigarettes  . Quit date: 06/23/1968  Smokeless Tobacco  . Never Used   History  Alcohol Use No    Family History:  Family History  Problem Relation Age of Onset  . Heart disease Father     possibly  . Cancer Sister     breast  . AAA (abdominal aortic aneurysm) Brother   . Cancer Sister     lung    Past medical history, surgical history, medications, allergies, family history and  social history reviewed with patient today and changes made to appropriate areas of the chart.   Review of Systems  Constitutional: Negative.   HENT: Negative.   Eyes: Positive for blurred vision. Negative for double vision, photophobia, pain, discharge and redness.  Respiratory: Negative.   Cardiovascular: Negative.   Gastrointestinal: Negative.   Genitourinary: Negative.   Musculoskeletal: Positive for myalgias. Negative for back pain, falls, joint pain and neck pain.  Skin: Negative.   Neurological: Negative.   Endo/Heme/Allergies: Negative for environmental allergies and polydipsia. Bruises/bleeds easily.  Psychiatric/Behavioral: Negative.     All other ROS negative except what is listed above and in the HPI.      Objective:    BP 138/68   Pulse (!) 57   Temp 98.6 F (37 C) (Oral)   Resp 17   Ht 5' 9.1" (1.755 m)   Wt 176 lb (79.8 kg)   SpO2 100%   BMI 25.92 kg/m   Wt Readings from Last 3 Encounters:  08/21/16 176 lb (79.8 kg)  02/13/16 180 lb (81.6 kg)  08/15/15 181 lb 9.6 oz (82.4 kg)     Hearing Screening   125Hz  250Hz  500Hz  1000Hz  2000Hz  3000Hz  4000Hz  6000Hz  8000Hz   Right ear:   40 40   40    Left ear:   40 40 40  40    Vision Screening Comments: Done at Opthalmologist  Physical Exam  Constitutional: He is oriented to person, place, and time. He appears well-developed and well-nourished. No distress.  HENT:  Head: Normocephalic and atraumatic.  Right Ear: Hearing, tympanic membrane, external ear and ear canal normal.  Left Ear: Hearing, tympanic membrane, external ear and ear canal normal.  Nose: Nose normal.  Mouth/Throat: Uvula is midline, oropharynx is clear and moist and mucous membranes are normal. No oropharyngeal exudate.  Eyes: Conjunctivae, EOM and lids are normal. Pupils are equal, round, and reactive to light. Right eye exhibits no discharge. Left eye exhibits no discharge. No scleral icterus.  Neck: Normal range of motion. Neck supple. No JVD  present. No tracheal deviation present. No thyromegaly present.  Cardiovascular: Normal rate, regular rhythm, normal heart sounds and intact distal pulses.  Exam reveals no gallop and no friction rub.   No murmur heard. Pulmonary/Chest: Effort normal and breath sounds normal. No stridor. No respiratory distress. He has no wheezes. He has no rales. He exhibits no tenderness.  Abdominal: Soft. Bowel sounds are normal. He exhibits no distension and no mass. There is no tenderness. There is no rebound and no guarding.  Genitourinary:  Genitourinary Comments: Deferred with shared decision making  Musculoskeletal: Normal range of motion. He exhibits no edema, tenderness or deformity.  Lymphadenopathy:    He has no cervical adenopathy.  Neurological: He is alert and oriented to person, place, and time. He has normal reflexes. He displays normal reflexes. No cranial nerve deficit. He exhibits normal muscle tone. Coordination normal.  Skin: Skin is warm, dry and intact. No rash noted. He is not diaphoretic. No erythema. No pallor.  Psychiatric: He has a normal mood and affect. His speech is normal and behavior is normal. Judgment and thought content normal. Cognition and memory are normal.  Nursing note and vitals reviewed.   6CIT Screen 08/21/2016  What Year? 0 points  What month? 0 points  What time? 0 points  Count back from 20 0 points  Months in reverse 0 points  Repeat phrase 2 points  Total Score 2    Results for orders placed or performed in visit on 02/13/16  Bayer DCA Hb A1c Waived  Result Value Ref Range   Bayer DCA Hb A1c Waived 5.4 <7.0 %  Comprehensive metabolic panel  Result Value Ref Range   Glucose 93 65 - 99 mg/dL   BUN 6 (L) 8 - 27 mg/dL   Creatinine, Ser 3.08 0.76 - 1.27 mg/dL   GFR calc non Af Amer 78 >59 mL/min/1.73   GFR calc Af Amer 90 >59 mL/min/1.73   BUN/Creatinine Ratio 7 (L) 10 - 24   Sodium 133 (L) 134 - 144 mmol/L   Potassium 4.5 3.5 - 5.2 mmol/L   Chloride  97 96 - 106 mmol/L   CO2 22 18 - 29 mmol/L   Calcium 9.2 8.6 - 10.2 mg/dL   Total Protein 6.3 6.0 - 8.5 g/dL   Albumin 4.3 3.5 - 4.7 g/dL   Globulin, Total 2.0 1.5 - 4.5 g/dL   Albumin/Globulin Ratio 2.2 1.2 - 2.2   Bilirubin Total 1.0 0.0 - 1.2 mg/dL   Alkaline Phosphatase 57 39 - 117 IU/L   AST 26 0 - 40 IU/L   ALT 16 0 - 44 IU/L  Lipid Panel Piccolo, Waived  Result Value Ref Range   Cholesterol Piccolo, Waived 123 <200 mg/dL   HDL Chol Piccolo, Waived 41 (L) >59 mg/dL   Triglycerides Piccolo,Waived 114 <150 mg/dL   Chol/HDL Ratio Piccolo,Waive 3.0 mg/dL   LDL Chol Calc Piccolo Waived 59 <100 mg/dL   VLDL Chol Calc Piccolo,Waive 23 <30 mg/dL  Microalbumin, Urine Waived  Result Value Ref Range   Microalb, Ur Waived 10 0 - 19 mg/L   Creatinine, Urine Waived 10 10 - 300 mg/dL   Microalb/Creat Ratio <30 <30 mg/g  TSH  Result Value Ref Range   TSH 2.370 0.450 - 4.500 uIU/mL      Assessment & Plan:   Problem List Items Addressed This Visit      Cardiovascular and Mediastinum   Hypertension    Better on recheck. Risks of falls outweigh benefits. Call with any concerns. Recheck 3 months.       Relevant Medications   simvastatin (ZOCOR) 20 MG tablet   Other Relevant Orders   Comprehensive metabolic panel   CAD (coronary artery disease)    Continue to follow with cardiology. Stable. Call with any concerns.       Relevant Medications   simvastatin (ZOCOR) 20 MG tablet   Other Relevant Orders   Comprehensive metabolic panel     Other   Macular degeneration    Continue to follow with opthalmology. Call with any concerns.       Hyperlipemia    Rechecking levels today. Continue every other day simvastatin. Call with any concerns.  Relevant Medications   simvastatin (ZOCOR) 20 MG tablet   Other Relevant Orders   Comprehensive metabolic panel   Lipid Panel w/o Chol/HDL Ratio    Other Visit Diagnoses    Wellness examination    -  Primary   Preventative care  discussed as below.      Preventative Services:  Health Risk Assessment and Personalized Prevention Plan: Done today Bone Mass Measurements: N/A CVD Screening: Done today Colon Cancer Screening: N/A Depression Screening: Done today Diabetes Screening: Done today  Glaucoma Screening: See your eye doctor Hepatitis B vaccine: N/A Hepatitis C screening: N/A HIV Screening: N/A Flu Vaccine: Up to date Lung cancer Screening: N/A Obesity Screening: Done today Pneumonia Vaccines (2): Due for prevnar- declined STI Screening: N/A PSA screening: N/A  Discussed aspirin prophylaxis for myocardial infarction prevention and decision was made to continue ASA  LABORATORY TESTING:  Health maintenance labs ordered today as discussed above.   The natural history of prostate cancer and ongoing controversy regarding screening and potential treatment outcomes of prostate cancer has been discussed with the patient. The meaning of a false positive PSA and a false negative PSA has been discussed. He indicates understanding of the limitations of this screening test and wishes not to proceed with screening PSA testing.   IMMUNIZATIONS:   - Tdap: Tetanus vaccination status reviewed: Refused. - Influenza: Up to date - Pneumovax: Up to date - Prevnar: Refused - Zostavax vaccine: Not applicable  SCREENING: - Colonoscopy: Not applicable   PATIENT COUNSELING:    Sexuality: Discussed sexually transmitted diseases, partner selection, use of condoms, avoidance of unintended pregnancy  and contraceptive alternatives.   Advised to avoid cigarette smoking.  I discussed with the patient that most people either abstain from alcohol or drink within safe limits (<=14/week and <=4 drinks/occasion for males, <=7/weeks and <= 3 drinks/occasion for females) and that the risk for alcohol disorders and other health effects rises proportionally with the number of drinks per week and how often a drinker exceeds daily  limits.  Discussed cessation/primary prevention of drug use and availability of treatment for abuse.   Diet: Encouraged to adjust caloric intake to maintain  or achieve ideal body weight, to reduce intake of dietary saturated fat and total fat, to limit sodium intake by avoiding high sodium foods and not adding table salt, and to maintain adequate dietary potassium and calcium preferably from fresh fruits, vegetables, and low-fat dairy products.    stressed the importance of regular exercise  Injury prevention: Discussed safety belts, safety helmets, smoke detector, smoking near bedding or upholstery.   Dental health: Discussed importance of regular tooth brushing, flossing, and dental visits.   Follow up plan: NEXT PREVENTATIVE PHYSICAL DUE IN 1 YEAR. Return in about 3 months (around 11/21/2016) for Follow up.

## 2016-08-21 NOTE — Patient Instructions (Addendum)
Preventative Services:  Health Risk Assessment and Personalized Prevention Plan: Done today Bone Mass Measurements: N/A CVD Screening: Done today Colon Cancer Screening: N/A Depression Screening: Done today Diabetes Screening: Done today  Glaucoma Screening: See your eye doctor Hepatitis B vaccine: N/A Hepatitis C screening: N/A HIV Screening: N/A Flu Vaccine: Up to date Lung cancer Screening: N/A Obesity Screening: Done today Pneumonia Vaccines (2): Due for prevnar- declined STI Screening: N/A PSA screening: N/A   Health Maintenance, Male A healthy lifestyle and preventive care is important for your health and wellness. Ask your health care provider about what schedule of regular examinations is right for you. What should I know about weight and diet?  Eat a Healthy Diet  Eat plenty of vegetables, fruits, whole grains, low-fat dairy products, and lean protein.  Do not eat a lot of foods high in solid fats, added sugars, or salt. Maintain a Healthy Weight  Regular exercise can help you achieve or maintain a healthy weight. You should:  Do at least 150 minutes of exercise each week. The exercise should increase your heart rate and make you sweat (moderate-intensity exercise).  Do strength-training exercises at least twice a week. Watch Your Levels of Cholesterol and Blood Lipids  Have your blood tested for lipids and cholesterol every 5 years starting at 81 years of age. If you are at high risk for heart disease, you should start having your blood tested when you are 81 years old. You may need to have your cholesterol levels checked more often if:  Your lipid or cholesterol levels are high.  You are older than 81 years of age.  You are at high risk for heart disease. What should I know about cancer screening? Many types of cancers can be detected early and may often be prevented. Lung Cancer  You should be screened every year for lung cancer if:  You are a current  smoker who has smoked for at least 30 years.  You are a former smoker who has quit within the past 15 years.  Talk to your health care provider about your screening options, when you should start screening, and how often you should be screened. Colorectal Cancer  Routine colorectal cancer screening usually begins at 81 years of age and should be repeated every 5-10 years until you are 81 years old. You may need to be screened more often if early forms of precancerous polyps or small growths are found. Your health care provider may recommend screening at an earlier age if you have risk factors for colon cancer.  Your health care provider may recommend using home test kits to check for hidden blood in the stool.  A small camera at the end of a tube can be used to examine your colon (sigmoidoscopy or colonoscopy). This checks for the earliest forms of colorectal cancer. Prostate and Testicular Cancer  Depending on your age and overall health, your health care provider may do certain tests to screen for prostate and testicular cancer.  Talk to your health care provider about any symptoms or concerns you have about testicular or prostate cancer. Skin Cancer  Check your skin from head to toe regularly.  Tell your health care provider about any new moles or changes in moles, especially if:  There is a change in a mole's size, shape, or color.  You have a mole that is larger than a pencil eraser.  Always use sunscreen. Apply sunscreen liberally and repeat throughout the day.  Protect yourself by wearing  long sleeves, pants, a wide-brimmed hat, and sunglasses when outside. What should I know about heart disease, diabetes, and high blood pressure?  If you are 3518-81 years of age, have your blood pressure checked every 3-5 years. If you are 81 years of age or older, have your blood pressure checked every year. You should have your blood pressure measured twice-once when you are at a hospital or  clinic, and once when you are not at a hospital or clinic. Record the average of the two measurements. To check your blood pressure when you are not at a hospital or clinic, you can use:  An automated blood pressure machine at a pharmacy.  A home blood pressure monitor.  Talk to your health care provider about your target blood pressure.  If you are between 4545-81 years old, ask your health care provider if you should take aspirin to prevent heart disease.  Have regular diabetes screenings by checking your fasting blood sugar level.  If you are at a normal weight and have a low risk for diabetes, have this test once every three years after the age of 81.  If you are overweight and have a high risk for diabetes, consider being tested at a younger age or more often.  A one-time screening for abdominal aortic aneurysm (AAA) by ultrasound is recommended for men aged 65-75 years who are current or former smokers. What should I know about preventing infection? Hepatitis B  If you have a higher risk for hepatitis B, you should be screened for this virus. Talk with your health care provider to find out if you are at risk for hepatitis B infection. Hepatitis C  Blood testing is recommended for:  Everyone born from [id number] through 1963.  Anyone with known risk factors for hepatitis C. Sexually Transmitted Diseases (STDs)  You should be screened each year for STDs including gonorrhea and chlamydia if:  You are sexually active and are younger than 81 years of age.  You are older than 81 years of age and your health care provider tells you that you are at risk for this type of infection.  Your sexual activity has changed since you were last screened and you are at an increased risk for chlamydia or gonorrhea. Ask your health care provider if you are at risk.  Talk with your health care provider about whether you are at high risk of being infected with HIV. Your health care provider may recommend a  prescription medicine to help prevent HIV infection. What else can I do?  Schedule regular health, dental, and eye exams.  Stay current with your vaccines (immunizations).  Do not use any tobacco products, such as cigarettes, chewing tobacco, and e-cigarettes. If you need help quitting, ask your health care provider.  Limit alcohol intake to no more than 2 drinks per day. One drink equals 12 ounces of beer, 5 ounces of wine, or 1 ounces of hard liquor.  Do not use street drugs.  Do not share needles.  Ask your health care provider for help if you need support or information about quitting drugs.  Tell your health care provider if you often feel depressed.  Tell your health care provider if you have ever been abused or do not feel safe at home. This information is not intended to replace advice given to you by your health care provider. Make sure you discuss any questions you have with your health care provider. Document Released: 12/06/2007 Document Revised: 02/06/2016 Document Reviewed: 03/13/2015  Elsevier Interactive Patient Education  2017 [employer]  

## 2016-08-22 ENCOUNTER — Telehealth: Payer: Self-pay | Admitting: Family Medicine

## 2016-08-22 ENCOUNTER — Other Ambulatory Visit: Payer: Self-pay | Admitting: *Deleted

## 2016-08-22 LAB — COMPREHENSIVE METABOLIC PANEL
A/G RATIO: 2.6 — AB (ref 1.2–2.2)
ALK PHOS: 58 IU/L (ref 39–117)
ALT: 20 IU/L (ref 0–44)
AST: 22 IU/L (ref 0–40)
Albumin: 4.2 g/dL (ref 3.5–4.7)
BILIRUBIN TOTAL: 0.4 mg/dL (ref 0.0–1.2)
BUN / CREAT RATIO: 10 (ref 10–24)
BUN: 7 mg/dL — ABNORMAL LOW (ref 8–27)
CO2: 25 mmol/L (ref 18–29)
Calcium: 8.8 mg/dL (ref 8.6–10.2)
Chloride: 103 mmol/L (ref 96–106)
Creatinine, Ser: 0.68 mg/dL — ABNORMAL LOW (ref 0.76–1.27)
GFR calc Af Amer: 99 mL/min/{1.73_m2} (ref >59)
GFR calc non Af Amer: 86 mL/min/{1.73_m2} (ref >59)
GLOBULIN, TOTAL: 1.6 g/dL (ref 1.5–4.5)
Glucose: 84 mg/dL (ref 65–99)
POTASSIUM: 3.8 mmol/L (ref 3.5–5.2)
SODIUM: 143 mmol/L (ref 134–144)
Total Protein: 5.8 g/dL — ABNORMAL LOW (ref 6.0–8.5)

## 2016-08-22 LAB — LIPID PANEL W/O CHOL/HDL RATIO
CHOLESTEROL TOTAL: 124 mg/dL (ref 100–199)
HDL: 41 mg/dL (ref >39)
LDL Calculated: 43 mg/dL (ref 0–99)
TRIGLYCERIDES: 202 mg/dL — AB (ref 0–149)
VLDL Cholesterol Cal: 40 mg/dL (ref 5–40)

## 2016-08-22 MED ORDER — SIMVASTATIN 10 MG PO TABS: 10.0000 mg | ORAL_TABLET | Freq: Every day | ORAL | 3 refills | Status: DC

## 2016-08-22 MED ORDER — SIMVASTATIN 10 MG PO TABS: 10.0000 mg | ORAL_TABLET | ORAL | 1 refills | Status: DC

## 2016-08-22 NOTE — Telephone Encounter (Signed)
Please let him know that his labs came back nice and normal. Thanks! 

## 2016-08-22 NOTE — Addendum Note (Signed)
Addended by: Dorcas CarrowJOHNSON, MEGAN P on: 08/22/2016 09:11 AM   Modules accepted: Orders

## 2016-08-22 NOTE — Telephone Encounter (Signed)
Spoke with Dr. Laural BenesJohnson about simvastatin dosage. She clarified patient should be taking 10 mg every other day. Reordered medication and spoke with pharmacist to clarify new dosage.

## 2016-08-22 NOTE — Telephone Encounter (Signed)
Informed patient of normal lab work and that Dr. Laural BenesJohnson changed the Simvastatin from 20 mg to 10 so that he can take a whole pill every other day  Instead of having to cut them in half.

## 2016-09-26 DIAGNOSIS — H353222 Exudative age-related macular degeneration, left eye, with inactive choroidal neovascularization: Secondary | ICD-10-CM | POA: Diagnosis not present

## 2016-11-25 ENCOUNTER — Ambulatory Visit (INDEPENDENT_AMBULATORY_CARE_PROVIDER_SITE_OTHER): Payer: Medicare Other | Admitting: Family Medicine

## 2016-11-25 ENCOUNTER — Encounter: Payer: Self-pay | Admitting: Family Medicine

## 2016-11-25 VITALS — BP 146/70 | HR 50 | Temp 97.8°F | Wt 180.2 lb

## 2016-11-25 DIAGNOSIS — I10 Essential (primary) hypertension: Secondary | ICD-10-CM | POA: Diagnosis not present

## 2016-11-25 DIAGNOSIS — E782 Mixed hyperlipidemia: Secondary | ICD-10-CM

## 2016-11-25 LAB — MICROALBUMIN, URINE WAIVED
Creatinine, Urine Waived: 10 mg/dL (ref 10–300)
Microalb, Ur Waived: 10 mg/L (ref 0–19)
Microalb/Creat Ratio: <30 mg/g (ref <30)

## 2016-11-25 NOTE — Assessment & Plan Note (Signed)
Better on recheck. Risks of falls outweigh benefits. Call with any concerns. Recheck 3 months.  

## 2016-11-25 NOTE — Progress Notes (Signed)
BP (!) 146/70   Pulse (!) 50   Temp 97.8 F (36.6 C)   Wt 180 lb 3.2 oz (81.7 kg)   SpO2 99%   BMI 26.53 kg/m    Subjective:    Patient ID: Nicholas Gross, male    DOB: Mar 25, 1928, 83 y.o.   MRN: [id number]  HPI: Nicholas Gross is a 83 y.o. male  Chief Complaint  Patient presents with  . Hyperlipidemia  . Hypertension   HYPERTENSION / HYPERLIPIDEMIA Satisfied with current treatment? yes Duration of hypertension: chronic BP monitoring frequency: every morning BP medication side effects: no Duration of hyperlipidemia: chronic Cholesterol medication side effects: yes- has been aching Cholesterol supplements: none Past cholesterol medications: simvastatin Medication compliance: good compliance Aspirin: yes Recent stressors: no Recurrent headaches: no Visual changes: yes Palpitations: no Dyspnea: no Chest pain: no Lower extremity edema: no Dizzy/lightheaded: no   Relevant past medical, surgical, family and social history reviewed and updated as indicated. Interim medical history since our last visit reviewed. Allergies and medications reviewed and updated.  Review of Systems  Constitutional: Negative.   Respiratory: Negative.   Cardiovascular: Negative.   Psychiatric/Behavioral: Negative.     Per HPI unless specifically indicated above     Objective:    BP (!) 146/70   Pulse (!) 50   Temp 97.8 F (36.6 C)   Wt 180 lb 3.2 oz (81.7 kg)   SpO2 99%   BMI 26.53 kg/m   Wt Readings from Last 3 Encounters:  11/25/16 180 lb 3.2 oz (81.7 kg)  08/21/16 176 lb (79.8 kg)  02/13/16 180 lb (81.6 kg)    Physical Exam  Constitutional: He is oriented to person, place, and time. He appears well-developed and well-nourished. No distress.  HENT:  Head: Normocephalic and atraumatic.  Right Ear: Hearing normal.  Left Ear: Hearing normal.  Nose: Nose normal.  Eyes: Conjunctivae and lids are normal. Right eye exhibits no discharge. Left eye exhibits no  discharge. No scleral icterus.  Cardiovascular: Normal rate, regular rhythm, normal heart sounds and intact distal pulses.  Exam reveals no gallop and no friction rub.   No murmur heard. Pulmonary/Chest: Effort normal and breath sounds normal. No respiratory distress. He has no wheezes. He has no rales. He exhibits no tenderness.  Musculoskeletal: Normal range of motion.  Neurological: He is alert and oriented to person, place, and time.  Skin: Skin is warm, dry and intact. No rash noted. He is not diaphoretic. No erythema. No pallor.  Psychiatric: He has a normal mood and affect. His speech is normal and behavior is normal. Judgment and thought content normal. Cognition and memory are normal.  Nursing note and vitals reviewed.   Results for orders placed or performed in visit on 08/21/16  Comprehensive metabolic panel  Result Value Ref Range   Glucose 84 65 - 99 mg/dL   BUN 7 (L) 8 - 27 mg/dL   Creatinine, Ser 7.820.68 (L) 0.76 - 1.27 mg/dL   GFR calc non Af Amer 86 >59 mL/min/1.73   GFR calc Af Amer 99 >59 mL/min/1.73   BUN/Creatinine Ratio 10 10 - 24   Sodium 143 134 - 144 mmol/L   Potassium 3.8 3.5 - 5.2 mmol/L   Chloride 103 96 - 106 mmol/L   CO2 25 18 - 29 mmol/L   Calcium 8.8 8.6 - 10.2 mg/dL   Total Protein 5.8 (L) 6.0 - 8.5 g/dL   Albumin 4.2 3.5 - 4.7 g/dL   Globulin, Total 1.6  1.5 - 4.5 g/dL   Albumin/Globulin Ratio 2.6 (H) 1.2 - 2.2   Bilirubin Total 0.4 0.0 - 1.2 mg/dL   Alkaline Phosphatase 58 39 - 117 IU/L   AST 22 0 - 40 IU/L   ALT 20 0 - 44 IU/L  Lipid Panel w/o Chol/HDL Ratio  Result Value Ref Range   Cholesterol, Total 124 100 - 199 mg/dL   Triglycerides 086 (H) 0 - 149 mg/dL   HDL 41 >57 mg/dL   VLDL Cholesterol Cal 40 5 - 40 mg/dL   LDL Calculated 43 0 - 99 mg/dL      Assessment & Plan:   Problem List Items Addressed This Visit      Cardiovascular and Mediastinum   Hypertension - Primary    Better on recheck. Risks of falls outweigh benefits. Call with  any concerns. Recheck 3 months.       Relevant Orders   Comprehensive metabolic panel   Microalbumin, Urine Waived     Other   Hyperlipemia    Rechecking levels today. Continue every other day simvastatin. Call with any concerns.       Relevant Orders   Comprehensive metabolic panel   Lipid Panel w/o Chol/HDL Ratio       Follow up plan: Return in about 3 months (around 02/25/2017).

## 2016-11-25 NOTE — Assessment & Plan Note (Signed)
Rechecking levels today. Continue every other day simvastatin. Call with any concerns.

## 2016-11-26 ENCOUNTER — Encounter: Payer: Self-pay | Admitting: Family Medicine

## 2016-11-26 LAB — COMPREHENSIVE METABOLIC PANEL
A/G RATIO: 2 (ref 1.2–2.2)
ALT: 17 IU/L (ref 0–44)
AST: 22 IU/L (ref 0–40)
Albumin: 4.3 g/dL (ref 3.5–4.7)
Alkaline Phosphatase: 60 IU/L (ref 39–117)
BILIRUBIN TOTAL: 0.8 mg/dL (ref 0.0–1.2)
BUN/Creatinine Ratio: 7 — ABNORMAL LOW (ref 10–24)
BUN: 5 mg/dL — ABNORMAL LOW (ref 8–27)
CHLORIDE: 101 mmol/L (ref 96–106)
CO2: 24 mmol/L (ref 18–29)
Calcium: 8.9 mg/dL (ref 8.6–10.2)
Creatinine, Ser: 0.68 mg/dL — ABNORMAL LOW (ref 0.76–1.27)
GFR calc non Af Amer: 86 mL/min/{1.73_m2} (ref >59)
GFR, EST AFRICAN AMERICAN: 99 mL/min/{1.73_m2} (ref >59)
GLOBULIN, TOTAL: 2.2 g/dL (ref 1.5–4.5)
Glucose: 106 mg/dL — ABNORMAL HIGH (ref 65–99)
POTASSIUM: 3.9 mmol/L (ref 3.5–5.2)
SODIUM: 136 mmol/L (ref 134–144)
Total Protein: 6.5 g/dL (ref 6.0–8.5)

## 2016-11-26 LAB — LIPID PANEL W/O CHOL/HDL RATIO
Cholesterol, Total: 136 mg/dL (ref 100–199)
HDL: 41 mg/dL (ref >39)
LDL Calculated: 66 mg/dL (ref 0–99)
TRIGLYCERIDES: 147 mg/dL (ref 0–149)
VLDL Cholesterol Cal: 29 mg/dL (ref 5–40)

## 2017-02-25 ENCOUNTER — Ambulatory Visit (INDEPENDENT_AMBULATORY_CARE_PROVIDER_SITE_OTHER): Payer: Medicare Other | Admitting: Family Medicine

## 2017-02-25 ENCOUNTER — Encounter: Payer: Self-pay | Admitting: Family Medicine

## 2017-02-25 VITALS — BP 122/60 | HR 54 | Temp 98.3°F | Wt 180.6 lb

## 2017-02-25 DIAGNOSIS — E782 Mixed hyperlipidemia: Secondary | ICD-10-CM | POA: Diagnosis not present

## 2017-02-25 DIAGNOSIS — I10 Essential (primary) hypertension: Secondary | ICD-10-CM

## 2017-02-25 MED ORDER — SIMVASTATIN 10 MG PO TABS: 10.0000 mg | ORAL_TABLET | ORAL | 1 refills | Status: DC

## 2017-02-25 NOTE — Assessment & Plan Note (Signed)
Having myalgias on his simvastatin. Offered to change to crestor. He's not interested until he talks to his cardiologist. Will discuss it with him in December.

## 2017-02-25 NOTE — Progress Notes (Signed)
BP 122/60   Pulse (!) 54   Temp 98.3 F (36.8 C)   Wt 180 lb 9 oz (81.9 kg)   SpO2 99%   BMI 26.59 kg/m    Subjective:    Patient ID: Nicholas Gross, male    DOB: 1927-08-30, 83 y.o.   MRN: [id number]  HPI: Nicholas Gross is a 83 y.o. male  Chief Complaint  Patient presents with  . Hypertension   HYPERTENSION Hypertension status: stable  Satisfied with current treatment? yes Duration of hypertension: chronic BP monitoring frequency:  daily BP range: 140s/70s BP medication side effects:  not on anything Aspirin: no Recurrent headaches: no Visual changes: no Palpitations: no Dyspnea: no Chest pain: no Lower extremity edema: no Dizzy/lightheaded: no   Relevant past medical, surgical, family and social history reviewed and updated as indicated. Interim medical history since our last visit reviewed. Allergies and medications reviewed and updated.  Review of Systems  Constitutional: Negative.   Respiratory: Negative.   Cardiovascular: Negative.   Musculoskeletal: Positive for arthralgias and myalgias. Negative for back pain, gait problem, joint swelling, neck pain and neck stiffness.  Psychiatric/Behavioral: Negative.     Per HPI unless specifically indicated above     Objective:    BP 122/60   Pulse (!) 54   Temp 98.3 F (36.8 C)   Wt 180 lb 9 oz (81.9 kg)   SpO2 99%   BMI 26.59 kg/m   Wt Readings from Last 3 Encounters:  02/25/17 180 lb 9 oz (81.9 kg)  11/25/16 180 lb 3.2 oz (81.7 kg)  08/21/16 176 lb (79.8 kg)    Physical Exam  Constitutional: He is oriented to person, place, and time. He appears well-developed and well-nourished. No distress.  HENT:  Head: Normocephalic and atraumatic.  Right Ear: Hearing normal.  Left Ear: Hearing normal.  Nose: Nose normal.  Eyes: Conjunctivae and lids are normal. Right eye exhibits no discharge. Left eye exhibits no discharge. No scleral icterus.  Cardiovascular: Normal rate, regular rhythm, normal  heart sounds and intact distal pulses.  Exam reveals no gallop and no friction rub.   No murmur heard. Pulmonary/Chest: Effort normal and breath sounds normal. No respiratory distress. He has no wheezes. He has no rales. He exhibits no tenderness.  Musculoskeletal: Normal range of motion.  Neurological: He is alert and oriented to person, place, and time.  Skin: Skin is warm, dry and intact. No rash noted. He is not diaphoretic. No erythema. No pallor.  Psychiatric: He has a normal mood and affect. His speech is normal and behavior is normal. Judgment and thought content normal. Cognition and memory are normal.  Nursing note and vitals reviewed.   Results for orders placed or performed in visit on 11/25/16  Comprehensive metabolic panel  Result Value Ref Range   Glucose 106 (H) 65 - 99 mg/dL   BUN 5 (L) 8 - 27 mg/dL   Creatinine, Ser 4.78 (L) 0.76 - 1.27 mg/dL   GFR calc non Af Amer 86 >59 mL/min/1.73   GFR calc Af Amer 99 >59 mL/min/1.73   BUN/Creatinine Ratio 7 (L) 10 - 24   Sodium 136 134 - 144 mmol/L   Potassium 3.9 3.5 - 5.2 mmol/L   Chloride 101 96 - 106 mmol/L   CO2 24 18 - 29 mmol/L   Calcium 8.9 8.6 - 10.2 mg/dL   Total Protein 6.5 6.0 - 8.5 g/dL   Albumin 4.3 3.5 - 4.7 g/dL   Globulin, Total 2.2  1.5 - 4.5 g/dL   Albumin/Globulin Ratio 2.0 1.2 - 2.2   Bilirubin Total 0.8 0.0 - 1.2 mg/dL   Alkaline Phosphatase 60 39 - 117 IU/L   AST 22 0 - 40 IU/L   ALT 17 0 - 44 IU/L  Lipid Panel w/o Chol/HDL Ratio  Result Value Ref Range   Cholesterol, Total 136 100 - 199 mg/dL   Triglycerides 188147 0 - 149 mg/dL   HDL 41 >41>39 mg/dL   VLDL Cholesterol Cal 29 5 - 40 mg/dL   LDL Calculated 66 0 - 99 mg/dL  Microalbumin, Urine Waived  Result Value Ref Range   Microalb, Ur Waived 10 0 - 19 mg/L   Creatinine, Urine Waived 10 10 - 300 mg/dL   Microalb/Creat Ratio <30 <30 mg/g      Assessment & Plan:   Problem List Items Addressed This Visit      Cardiovascular and Mediastinum    Hypertension - Primary    Better on recheck. Risks of falls outweigh benefits. Call with any concerns. Recheck 3 months.       Relevant Medications   simvastatin (ZOCOR) 10 MG tablet     Other   Hyperlipemia    Having myalgias on his simvastatin. Offered to change to crestor. He's not interested until he talks to his cardiologist. Will discuss it with him in December.       Relevant Medications   simvastatin (ZOCOR) 10 MG tablet       Follow up plan: Return in about 3 months (around 05/27/2017) for Follow up.

## 2017-02-25 NOTE — Assessment & Plan Note (Signed)
Better on recheck. Risks of falls outweigh benefits. Call with any concerns. Recheck 3 months.

## 2017-03-26 DIAGNOSIS — H353114 Nonexudative age-related macular degeneration, right eye, advanced atrophic with subfoveal involvement: Secondary | ICD-10-CM | POA: Diagnosis not present

## 2017-05-27 ENCOUNTER — Encounter: Payer: Self-pay | Admitting: Family Medicine

## 2017-05-27 ENCOUNTER — Ambulatory Visit (INDEPENDENT_AMBULATORY_CARE_PROVIDER_SITE_OTHER): Payer: Medicare Other | Admitting: Family Medicine

## 2017-05-27 VITALS — BP 158/63 | HR 54 | Wt 178.3 lb

## 2017-05-27 DIAGNOSIS — M791 Myalgia, unspecified site: Secondary | ICD-10-CM

## 2017-05-27 DIAGNOSIS — T466X5A Adverse effect of antihyperlipidemic and antiarteriosclerotic drugs, initial encounter: Secondary | ICD-10-CM | POA: Diagnosis not present

## 2017-05-27 DIAGNOSIS — I10 Essential (primary) hypertension: Secondary | ICD-10-CM

## 2017-05-27 DIAGNOSIS — E782 Mixed hyperlipidemia: Secondary | ICD-10-CM

## 2017-05-27 MED ORDER — ROSUVASTATIN CALCIUM 10 MG PO TABS: 10.0000 mg | ORAL_TABLET | Freq: Every day | ORAL | 1 refills | Status: DC

## 2017-05-27 NOTE — Assessment & Plan Note (Addendum)
Better on recheck. Stable at home. Risks of falls outweigh the benefits. Call with any concerns. Recheck 3 months.

## 2017-05-27 NOTE — Assessment & Plan Note (Signed)
Bad myalgias on simvastatin. Will change to crestor and recheck in 3 months. Call with any concerns.

## 2017-05-27 NOTE — Progress Notes (Signed)
BP (!) 158/63 (BP Location: Left Arm, Cuff Size: Normal)   Pulse (!) 54   Wt 178 lb 5 oz (80.9 kg)   SpO2 99%   BMI 26.26 kg/m    Subjective:    Patient ID: Nicholas Gross, male    DOB: 1927/10/15, 83 y.o.   MRN: [id number]  HPI: Nicholas Gross is a 83 y.o. male  Chief Complaint  Patient presents with  . Hyperlipidemia   HYPERTENSION / HYPERLIPIDEMIA Satisfied with current treatment? yes Duration of hypertension: chronic BP monitoring frequency: a few times a week BP range: 140s/70s BP medication side effects: Not on anything Past BP meds: none Duration of hyperlipidemia: chronic Cholesterol medication side effects: yes- severe myalgias on simvastatin. Stopped it in October. Would like to try crestor Cholesterol supplements: fish oil Past cholesterol medications: simvastatin (zocor) Medication compliance: poor compliance Aspirin: yes Recent stressors: no Recurrent headaches: no Visual changes: no Palpitations: no Dyspnea: no Chest pain: no Lower extremity edema: no Dizzy/lightheaded: no  Relevant past medical, surgical, family and social history reviewed and updated as indicated. Interim medical history since our last visit reviewed. Allergies and medications reviewed and updated.  Review of Systems  Constitutional: Negative.   Respiratory: Negative.   Cardiovascular: Negative.   Musculoskeletal: Negative for arthralgias, back pain, gait problem, joint swelling, myalgias, neck pain and neck stiffness.  Psychiatric/Behavioral: Negative.     Per HPI unless specifically indicated above     Objective:    BP (!) 158/63 (BP Location: Left Arm, Cuff Size: Normal)   Pulse (!) 54   Wt 178 lb 5 oz (80.9 kg)   SpO2 99%   BMI 26.26 kg/m   Wt Readings from Last 3 Encounters:  05/27/17 178 lb 5 oz (80.9 kg)  02/25/17 180 lb 9 oz (81.9 kg)  11/25/16 180 lb 3.2 oz (81.7 kg)    Physical Exam  Constitutional: He is oriented to person, place, and time. He  appears well-developed and well-nourished. No distress.  HENT:  Head: Normocephalic and atraumatic.  Right Ear: Hearing normal.  Left Ear: Hearing normal.  Nose: Nose normal.  Eyes: Conjunctivae and lids are normal. Right eye exhibits no discharge. Left eye exhibits no discharge. No scleral icterus.  Pulmonary/Chest: Effort normal. No respiratory distress.  Musculoskeletal: Normal range of motion.  Neurological: He is alert and oriented to person, place, and time.  Skin: Skin is intact. No rash noted.  Psychiatric: He has a normal mood and affect. His speech is normal and behavior is normal. Judgment and thought content normal. Cognition and memory are normal.    Results for orders placed or performed in visit on 11/25/16  Comprehensive metabolic panel  Result Value Ref Range   Glucose 106 (H) 65 - 99 mg/dL   BUN 5 (L) 8 - 27 mg/dL   Creatinine, Ser 4.090.68 (L) 0.76 - 1.27 mg/dL   GFR calc non Af Amer 86 >59 mL/min/1.73   GFR calc Af Amer 99 >59 mL/min/1.73   BUN/Creatinine Ratio 7 (L) 10 - 24   Sodium 136 134 - 144 mmol/L   Potassium 3.9 3.5 - 5.2 mmol/L   Chloride 101 96 - 106 mmol/L   CO2 24 18 - 29 mmol/L   Calcium 8.9 8.6 - 10.2 mg/dL   Total Protein 6.5 6.0 - 8.5 g/dL   Albumin 4.3 3.5 - 4.7 g/dL   Globulin, Total 2.2 1.5 - 4.5 g/dL   Albumin/Globulin Ratio 2.0 1.2 - 2.2   Bilirubin Total  0.8 0.0 - 1.2 mg/dL   Alkaline Phosphatase 60 39 - 117 IU/L   AST 22 0 - 40 IU/L   ALT 17 0 - 44 IU/L  Lipid Panel w/o Chol/HDL Ratio  Result Value Ref Range   Cholesterol, Total 136 100 - 199 mg/dL   Triglycerides 960147 0 - 149 mg/dL   HDL 41 >45>39 mg/dL   VLDL Cholesterol Cal 29 5 - 40 mg/dL   LDL Calculated 66 0 - 99 mg/dL  Microalbumin, Urine Waived  Result Value Ref Range   Microalb, Ur Waived 10 0 - 19 mg/L   Creatinine, Urine Waived 10 10 - 300 mg/dL   Microalb/Creat Ratio <30 <30 mg/g      Assessment & Plan:   Problem List Items Addressed This Visit      Cardiovascular  and Mediastinum   Hypertension    Better on recheck. Stable at home. Risks of falls outweigh the benefits. Call with any concerns. Recheck 3 months.       Relevant Medications   rosuvastatin (CRESTOR) 10 MG tablet   Other Relevant Orders   Comprehensive metabolic panel     Other   Hyperlipemia - Primary    Bad myalgias on simvastatin. Will change to crestor and recheck in 3 months. Call with any concerns.       Relevant Medications   rosuvastatin (CRESTOR) 10 MG tablet   Other Relevant Orders   Comprehensive metabolic panel   Lipid Panel w/o Chol/HDL Ratio    Other Visit Diagnoses    Myalgia due to statin       Changing from simvastatin to crestor. Will check vitamin D and replete if needed given recent studys linking this with statin-induced myalgias.    Relevant Orders   VITAMIN D 25 Hydroxy (Vit-D Deficiency, Fractures)       Follow up plan: Return in about 3 months (around 08/25/2017) for wellness/physical.

## 2017-05-27 NOTE — Patient Instructions (Addendum)
Protein Content in Foods Generally, most healthy people need around 50 grams of protein each day. Depending on your overall health, you may need more or less protein in your diet. Talk to your health care provider or dietitian about how much protein you need. See the following list for the protein content of some common foods. High-protein foods High-protein foods contain 4 grams (4 g) or more of protein per serving. They include:  Beef, ground sirloin (cooked) - 3 oz have 24 g of protein.  Cheese (hard) - 1 oz has 7 g of protein.  Chicken breast, boneless and skinless (cooked) - 3 oz have 13.4 g of protein.  Cottage cheese - 1/2 cup has 13.4 g of protein.  Egg - 1 egg has 6 g of protein.  Fish, filet (cooked) - 1 oz has 6-7 g of protein.  Garbanzo beans (canned or cooked) - 1/2 cup has 6-7 g of protein.  Kidney beans (canned or cooked) - 1/2 cup has 6-7 g of protein.  Lamb (cooked) - 3 oz has 24 g of protein.  Milk - 1 cup (8 oz) has 8 g of protein.  Nuts (peanuts, pistachios, almonds) - 1 oz has 6 g of protein.  Peanut butter - 1 oz has 7-8 g of protein.  Pork tenderloin (cooked) - 3 oz has 18.4 g of protein.  Pumpkin seeds - 1 oz has 8.5 g of protein.  Soybeans (roasted) - 1 oz has 8 g of protein.  Soybeans (cooked) - 1/2 cup has 11 g of protein.  Soy milk - 1 cup (8 oz) has 5-10 g of protein.  Soy or vegetable patty - 1 patty has 11 g of protein.  Sunflower seeds - 1 oz has 5.5 g of protein.  Tofu (firm) - 1/2 cup has 20 g of protein.  Tuna (canned in water) - 3 oz has 20 g of protein.  Yogurt - 6 oz has 8 g of protein.  Low-protein foods Low-protein foods contain 3 grams (3 g) or less of protein per serving. They include:  Beets (raw or cooked) - 1/2 cup has 1.5 g of protein.  Bran cereal - 1/2 cup has 2-3 g of protein.  Bread - 1 slice has 2.5 g of protein.  Broccoli (raw or cooked) - 1/2 cup has 2 g of protein.  Collard greens (raw or cooked) - 1/2  cup has 2 g of protein.  Corn (fresh or cooked) - 1/2 cup has 2 g of protein.  Cream cheese - 1 oz has 2 g of protein.  Creamer (half-and-half) - 1 oz has 1 g of protein.  Flour tortilla - 1 tortilla has 2.5 g of protein  Frozen yogurt - 1/2 cup has 3 g of protein.  Fruit or vegetable juice - 1/2 cup has 1 g of protein.  Green beans (raw or cooked) - 1/2 cup has 1 g of protein.  Green peas (canned) - 1/2 cup has 3.5 g of protein.  Muffins - 1 small muffin (2 oz) has 3 g of protein.  Oatmeal (cooked) - 1/2 cup has 3 g of protein.  Potato (baked with skin) - 1 medium potato has 3 g of protein.  Rice (cooked) - 1/2 cup has 2.5-3.5 g of protein.  Sour cream - 1/2 cup has 2.5 g of protein.  Spinach (cooked) - 1/2 cup has 3 g of protein.  Squash (cooked) - 1/2 cup has 1.5 g of protein.  Actual amounts of protein   may be different depending on processing. Talk with your health care provider or dietitian about what foods are recommended for you. This information is not intended to replace advice given to you by your health care provider. Make sure you discuss any questions you have with your health care provider. Document Released: 09/08/2015 Document Revised: 02/18/2016 Document Reviewed: 02/18/2016 Elsevier Interactive Patient Education  2018 [employer]  

## 2017-05-28 LAB — COMPREHENSIVE METABOLIC PANEL
A/G RATIO: 2.2 (ref 1.2–2.2)
ALBUMIN: 4.3 g/dL (ref 3.5–4.7)
ALK PHOS: 64 IU/L (ref 39–117)
ALT: 23 IU/L (ref 0–44)
AST: 34 IU/L (ref 0–40)
BILIRUBIN TOTAL: 0.7 mg/dL (ref 0.0–1.2)
BUN / CREAT RATIO: 9 — AB (ref 10–24)
BUN: 7 mg/dL — AB (ref 8–27)
CHLORIDE: 100 mmol/L (ref 96–106)
CO2: 25 mmol/L (ref 20–29)
Calcium: 9 mg/dL (ref 8.6–10.2)
Creatinine, Ser: 0.8 mg/dL (ref 0.76–1.27)
GFR, EST AFRICAN AMERICAN: 93 mL/min/{1.73_m2} (ref >59)
GFR, EST NON AFRICAN AMERICAN: 80 mL/min/{1.73_m2} (ref >59)
GLOBULIN, TOTAL: 2 g/dL (ref 1.5–4.5)
Glucose: 87 mg/dL (ref 65–99)
Potassium: 4.4 mmol/L (ref 3.5–5.2)
Sodium: 139 mmol/L (ref 134–144)
TOTAL PROTEIN: 6.3 g/dL (ref 6.0–8.5)

## 2017-05-28 LAB — LIPID PANEL W/O CHOL/HDL RATIO
Cholesterol, Total: 161 mg/dL (ref 100–199)
HDL: 40 mg/dL (ref >39)
LDL Calculated: 90 mg/dL (ref 0–99)
Triglycerides: 157 mg/dL — ABNORMAL HIGH (ref 0–149)
VLDL CHOLESTEROL CAL: 31 mg/dL (ref 5–40)

## 2017-05-28 LAB — VITAMIN D 25 HYDROXY (VIT D DEFICIENCY, FRACTURES): Vit D, 25-Hydroxy: 19.5 ng/mL — ABNORMAL LOW (ref 30.0–100.0)

## 2017-05-29 ENCOUNTER — Telehealth: Payer: Self-pay | Admitting: Family Medicine

## 2017-05-29 MED ORDER — VITAMIN D (ERGOCALCIFEROL) 1.25 MG (50000 UNIT) PO CAPS: 50000.0000 [IU] | ORAL_CAPSULE | ORAL | 0 refills | Status: DC

## 2017-05-29 NOTE — Telephone Encounter (Signed)
Patient notified

## 2017-05-29 NOTE — Telephone Encounter (Signed)
Please let him know that his labs came back good, but his vitamin D is really low, and this may be making him hurt more with the statin. So I'm going to send him through a vitamin he takes 1x a week for 3 months and that should get him back where he needs to be. Thanks!

## 2017-06-22 DIAGNOSIS — I2510 Atherosclerotic heart disease of native coronary artery without angina pectoris: Secondary | ICD-10-CM | POA: Diagnosis not present

## 2017-06-22 DIAGNOSIS — E7800 Pure hypercholesterolemia, unspecified: Secondary | ICD-10-CM | POA: Diagnosis not present

## 2017-06-22 DIAGNOSIS — I10 Essential (primary) hypertension: Secondary | ICD-10-CM | POA: Diagnosis not present

## 2017-06-22 DIAGNOSIS — Z9889 Other specified postprocedural states: Secondary | ICD-10-CM | POA: Diagnosis not present

## 2017-08-26 ENCOUNTER — Encounter: Payer: Self-pay | Admitting: Family Medicine

## 2017-08-26 ENCOUNTER — Ambulatory Visit (INDEPENDENT_AMBULATORY_CARE_PROVIDER_SITE_OTHER): Payer: Medicare Other

## 2017-08-26 ENCOUNTER — Ambulatory Visit (INDEPENDENT_AMBULATORY_CARE_PROVIDER_SITE_OTHER): Payer: Medicare Other | Admitting: Family Medicine

## 2017-08-26 VITALS — BP 189/76 | HR 55 | Temp 97.8°F | Resp 15 | Ht 70.0 in | Wt 180.7 lb

## 2017-08-26 VITALS — BP 165/60 | HR 55 | Ht 70.0 in | Wt 180.4 lb

## 2017-08-26 DIAGNOSIS — L298 Other pruritus: Secondary | ICD-10-CM

## 2017-08-26 DIAGNOSIS — R202 Paresthesia of skin: Secondary | ICD-10-CM

## 2017-08-26 DIAGNOSIS — Z0000 Encounter for general adult medical examination without abnormal findings: Secondary | ICD-10-CM

## 2017-08-26 DIAGNOSIS — E782 Mixed hyperlipidemia: Secondary | ICD-10-CM | POA: Diagnosis not present

## 2017-08-26 DIAGNOSIS — I10 Essential (primary) hypertension: Secondary | ICD-10-CM

## 2017-08-26 DIAGNOSIS — Z0001 Encounter for general adult medical examination with abnormal findings: Secondary | ICD-10-CM | POA: Diagnosis not present

## 2017-08-26 DIAGNOSIS — H3530 Unspecified macular degeneration: Secondary | ICD-10-CM

## 2017-08-26 DIAGNOSIS — I6529 Occlusion and stenosis of unspecified carotid artery: Secondary | ICD-10-CM

## 2017-08-26 DIAGNOSIS — I2510 Atherosclerotic heart disease of native coronary artery without angina pectoris: Secondary | ICD-10-CM

## 2017-08-26 DIAGNOSIS — B356 Tinea cruris: Secondary | ICD-10-CM

## 2017-08-26 LAB — MICROALBUMIN, URINE WAIVED
CREATININE, URINE WAIVED: 50 mg/dL (ref 10–300)
MICROALB, UR WAIVED: 10 mg/L (ref 0–19)

## 2017-08-26 LAB — MICROSCOPIC EXAMINATION: BACTERIA UA: NONE SEEN

## 2017-08-26 LAB — UA/M W/RFLX CULTURE, ROUTINE
BILIRUBIN UA: NEGATIVE
Glucose, UA: NEGATIVE
Ketones, UA: NEGATIVE
LEUKOCYTES UA: NEGATIVE
Nitrite, UA: NEGATIVE
PROTEIN UA: NEGATIVE
Specific Gravity, UA: 1.01 (ref 1.005–1.030)
Urobilinogen, Ur: 0.2 mg/dL (ref 0.2–1.0)
pH, UA: 7.5 (ref 5.0–7.5)

## 2017-08-26 MED ORDER — ASPIRIN EC 81 MG PO TBEC: 81.0000 mg | DELAYED_RELEASE_TABLET | Freq: Every day | ORAL | 12 refills | Status: DC

## 2017-08-26 MED ORDER — CLOTRIMAZOLE-BETAMETHASONE 1-0.05 % EX CREA: 1.0000 "application " | TOPICAL_CREAM | Freq: Two times a day (BID) | CUTANEOUS | 1 refills | Status: DC

## 2017-08-26 MED ORDER — ROSUVASTATIN CALCIUM 10 MG PO TABS: 10.0000 mg | ORAL_TABLET | Freq: Every day | ORAL | 1 refills | Status: DC

## 2017-08-26 NOTE — Assessment & Plan Note (Signed)
Stable. Continue current regimen. Continue to monitor. Call with any concerns.  

## 2017-08-26 NOTE — Assessment & Plan Note (Signed)
Will keep cholesterol under control. Refuses BP medicine. Denies any chest pain. Call with any concerns. Continue to follow with vascular.

## 2017-08-26 NOTE — Assessment & Plan Note (Signed)
Will keep cholesterol under control. Refuses BP medicine. Denies any chest pain. Call with any concerns.

## 2017-08-26 NOTE — Patient Instructions (Addendum)
[name] , Thank you for taking time to come for your Medicare Wellness Visit. I appreciate your ongoing commitment to your health goals. Please review the following plan we discussed and let me know if I can assist you in the future.   Screening recommendations/referrals: Colonoscopy: no longer required Recommended yearly ophthalmology/optometry visit for glaucoma screening and checkup Recommended yearly dental visit for hygiene and checkup  Vaccinations: Influenza vaccine: up to date Pneumococcal vaccine: declined Tdap vaccine: declined Shingles vaccine: not indicated  Advanced directives:Advance directive discussed with you today. Even though you declined this today please call our office should you change your mind and we can give you the proper paperwork for you to fill out.  Conditions/risks identified: Recommend drinking at least 6-8 glasses of water a day  Next appointment: Follow up in one year for your annual wellness exam.   Preventive Care 65 Years and Older, Male Preventive care refers to lifestyle choices and visits with your health care provider that can promote health and wellness. What does preventive care include?  A yearly physical exam. This is also called an annual well check.  Dental exams once or twice a year.  Routine eye exams. Ask your health care provider how often you should have your eyes checked.  Personal lifestyle choices, including:  Daily care of your teeth and gums.  Regular physical activity.  Eating a healthy diet.  Avoiding tobacco and drug use.  Limiting alcohol use.  Practicing safe sex.  Taking low doses of aspirin every day.  Taking vitamin and mineral supplements as recommended by your health care provider. What happens during an annual well check? The services and screenings done by your health care provider during your annual well check will depend on your age, overall health, lifestyle risk factors, and family history  of disease. Counseling  Your health care provider may ask you questions about your:  Alcohol use.  Tobacco use.  Drug use.  Emotional well-being.  Home and relationship well-being.  Sexual activity.  Eating habits.  History of falls.  Memory and ability to understand (cognition).  Work and work Astronome[employer]vironment. Screening  You may have the following tests or measurements:  Height, weight, and BMI.  Blood pressure.  Lipid and cholesterol levels. These may be checked every 5 years, or more frequently if you are over 82 years old.  Skin check.  Lung cancer screening. You may have this screening every year starting at age 82 if you have a 30-pack-year history of smoking and currently smoke or have quit within the past 15 years.  Fecal occult blood test (FOBT) of the stool. You may have this test every year starting at age 82.  Flexible sigmoidoscopy or colonoscopy. You may have a sigmoidoscopy every 5 years or a colonoscopy every 10 years starting at age 82.  Prostate cancer screening. Recommendations will vary depending on your family history and other risks.  Hepatitis C blood test.  Hepatitis B blood test.  Sexually transmitted disease (STD) testing.  Diabetes screening. This is done by checking your blood sugar (glucose) after you have not eaten for a while (fasting). You may have this done every 1-3 years.  Abdominal aortic aneurysm (AAA) screening. You may need this if you are a current or former smoker.  Osteoporosis. You may be screened starting at age 82 if you are at high risk. Talk with your health care provider about your test results, treatment options, and if necessary, the need for more tests. Vaccines  Your health care provider may recommend certain vaccines, such as:  Influenza vaccine. This is recommended every year.  Tetanus, diphtheria, and acellular pertussis (Tdap, Td) vaccine. You may need a Td booster every 10 years.  Zoster vaccine. You may  need this after age 54.  Pneumococcal 13-valent conjugate (PCV13) vaccine. One dose is recommended after age 45.  Pneumococcal polysaccharide (PPSV23) vaccine. One dose is recommended after age 101. Talk to your health care provider about which screenings and vaccines you need and how often you need them. This information is not intended to replace advice given to you by your health care provider. Make sure you discuss any questions you have with your health care provider. Document Released: 07/06/2015 Document Revised: 02/27/2016 Document Reviewed: 04/10/2015 Elsevier Interactive Patient Education  2017 [place] Prevention in the Home Falls can cause injuries. They can happen to people of all ages. There are many things you can do to make your home safe and to help prevent falls. What can I do on the outside of my home?  Regularly fix the edges of walkways and driveways and fix any cracks.  Remove anything that might make you trip as you walk through a door, such as a raised step or threshold.  Trim any bushes or trees on the path to your home.  Use bright outdoor lighting.  Clear any walking paths of anything that might make someone trip, such as rocks or tools.  Regularly check to see if handrails are loose or broken. Make sure that both sides of any steps have handrails.  Any raised decks and porches should have guardrails on the edges.  Have any leaves, snow, or ice cleared regularly.  Use sand or salt on walking paths during winter.  Clean up any spills in your garage right away. This includes oil or grease spills. What can I do in the bathroom?  Use night lights.  Install grab bars by the toilet and in the tub and shower. Do not use towel bars as grab bars.  Use non-skid mats or decals in the tub or shower.  If you need to sit down in the shower, use a plastic, non-slip stool.  Keep the floor dry. Clean up any water that spills on the floor as soon as it  happens.  Remove soap buildup in the tub or shower regularly.  Attach bath mats securely with double-sided non-slip rug tape.  Do not have throw rugs and other things on the floor that can make you trip. What can I do in the bedroom?  Use night lights.  Make sure that you have a light by your bed that is easy to reach.  Do not use any sheets or blankets that are too big for your bed. They should not hang down onto the floor.  Have a firm chair that has side arms. You can use this for support while you get dressed.  Do not have throw rugs and other things on the floor that can make you trip. What can I do in the kitchen?  Clean up any spills right away.  Avoid walking on wet floors.  Keep items that you use a lot in easy-to-reach places.  If you need to reach something above you, use a strong step stool that has a grab bar.  Keep electrical cords out of the way.  Do not use floor polish or wax that makes floors slippery. If you must use wax, use non-skid floor wax.  Do  not have throw rugs and other things on the floor that can make you trip. What can I do with my stairs?  Do not leave any items on the stairs.  Make sure that there are handrails on both sides of the stairs and use them. Fix handrails that are broken or loose. Make sure that handrails are as long as the stairways.  Check any carpeting to make sure that it is firmly attached to the stairs. Fix any carpet that is loose or worn.  Avoid having throw rugs at the top or bottom of the stairs. If you do have throw rugs, attach them to the floor with carpet tape.  Make sure that you have a light switch at the top of the stairs and the bottom of the stairs. If you do not have them, ask someone to add them for you. What else can I do to help prevent falls?  Wear shoes that:  Do not have high heels.  Have rubber bottoms.  Are comfortable and fit you well.  Are closed at the toe. Do not wear sandals.  If you  use a stepladder:  Make sure that it is fully opened. Do not climb a closed stepladder.  Make sure that both sides of the stepladder are locked into place.  Ask someone to hold it for you, if possible.  Clearly mark and make sure that you can see:  Any grab bars or handrails.  First and last steps.  Where the edge of each step is.  Use tools that help you move around (mobility aids) if they are needed. These include:  Canes.  Walkers.  Scooters.  Crutches.  Turn on the lights when you go into a dark area. Replace any light bulbs as soon as they burn out.  Set up your furniture so you have a clear path. Avoid moving your furniture around.  If any of your floors are uneven, fix them.  If there are any pets around you, be aware of where they are.  Review your medicines with your doctor. Some medicines can make you feel dizzy. This can increase your chance of falling. Ask your doctor what other things that you can do to help prevent falls. This information is not intended to replace advice given to you by your health care provider. Make sure you discuss any questions you have with your health care provider. Document Released: 04/05/2009 Document Revised: 11/15/2015 Document Reviewed: 07/14/2014 Elsevier Interactive Patient Education  2017 [employer].

## 2017-08-26 NOTE — Progress Notes (Signed)
BP (!) 165/60 (BP Location: Left Arm, Cuff Size: Normal)   Pulse (!) 55   Ht 5\' 10"  (1.778 m)   Wt 180 lb 7 oz (81.8 kg)   BMI 25.89 kg/m    Subjective:    Patient ID: ORSON RHO, male    DOB: 11/10/1927, 84 y.o.   MRN: [id number]  HPI: Nicholas Gross is a 84 y.o. male presenting on 08/26/2017 for comprehensive medical examination. Current medical complaints include:  HYPERTENSION / HYPERLIPIDEMIA Satisfied with current treatment? yes Duration of hypertension: chronic BP monitoring frequency: not checking BP medication side effects: Not on anything Past BP meds: not on anything Duration of hyperlipidemia: chronic Cholesterol medication side effects: no Cholesterol supplements: fish oil Past cholesterol medications: rosuvastatin (crestor) Medication compliance: excellent compliance Aspirin: yes Recent stressors: no Recurrent headaches: no Visual changes: no Palpitations: no Dyspnea: no Chest pain: no Lower extremity edema: no Dizzy/lightheaded: no  He currently lives with: alone Interim Problems from his last visit: no  Depression Screen done today and results listed below:  Depression screen [facility] 2/9 08/26/2017 08/21/2016 08/15/2015  Decreased Interest 0 0 0  Down, Depressed, Hopeless 0 0 0  PHQ - 2 Score 0 0 0  Altered sleeping 0 - -  Tired, decreased energy 0 - -  Change in appetite 0 - -  Feeling bad or failure about yourself  0 - -  Trouble concentrating 1 - -  Moving slowly or fidgety/restless 0 - -  Suicidal thoughts 0 - -  PHQ-9 Score 1 - -  Difficult doing work/chores Not difficult at all - -   Past Medical History:  Past Medical History:  Diagnosis Date  . CAD (coronary artery disease)   . History of GI bleed 2008  . History of tobacco use    17 pack years, quit around 1970  . Hyperlipidemia   . Hypertension   . Overweight     Surgical History:  Past Surgical History:  Procedure Laterality Date  . CAROTID ENDARTERECTOMY Left 2010  .  CATARACT EXTRACTION    . cypher stent  09/12/02   s/p cypher stent mid-LAD  . TENDON REPAIR  1991   finger and arm    Medications:  Current Outpatient Medications on File Prior to Visit  Medication Sig  . Multiple Vitamins-Minerals (PRESERVISION AREDS PO) Take by mouth 2 (two) times daily.  . Omega-3 Fatty Acids (FISH OIL PO) Take by mouth daily.  . Vitamin D, Ergocalciferol, (DRISDOL) 50000 units CAPS capsule Take 1 capsule (50,000 Units total) by mouth every 7 (seven) days.   No current facility-administered medications on file prior to visit.     Allergies:  Allergies  Allergen Reactions  . Other     Patient states that he is allergic to something that they place in the IV before they work on you  . Simvastatin Other (See Comments)    Myalgia     Social History:  Social History   Socioeconomic History  . Marital status: Widowed    Spouse name: Not on file  . Number of children: Not on file  . Years of education: Not on file  . Highest education level: Not on file  Social Needs  . Financial resource strain: Not hard at all  . Food insecurity - worry: Never true  . Food insecurity - inability: Never true  . Transportation needs - medical: No  . Transportation needs - non-medical: No  Occupational History  . Not on file  Tobacco Use  . Smoking status: Former Smoker    Packs/day: 1.00    Years: 17.00    Pack years: 17.00    Types: Cigarettes    Last attempt to quit: 06/23/1968    Years since quitting: 49.2  . Smokeless tobacco: Never Used  Substance and Sexual Activity  . Alcohol use: No  . Drug use: No  . Sexual activity: Not on file  Other Topics Concern  . Not on file  Social History Narrative  . Not on file   Social History   Tobacco Use  Smoking Status Former Smoker  . Packs/day: 1.00  . Years: 17.00  . Pack years: 17.00  . Types: Cigarettes  . Last attempt to quit: 06/23/1968  . Years since quitting: 49.2  Smokeless Tobacco Never Used   Social  History   Substance and Sexual Activity  Alcohol Use No    Family History:  Family History  Problem Relation Age of Onset  . Heart disease Father        possibly  . Cancer Sister        breast  . AAA (abdominal aortic aneurysm) Brother   . Cancer Sister        lung    Past medical history, surgical history, medications, allergies, family history and social history reviewed with patient today and changes made to appropriate areas of the chart.   Review of Systems  Constitutional: Negative.   HENT: Negative.   Eyes: Negative for blurred vision, double vision, photophobia, pain, discharge and redness.       Trouble with spots due to his macular degeneration  Respiratory: Negative.   Cardiovascular: Negative.   Gastrointestinal: Positive for heartburn (better with tums). Negative for abdominal pain, blood in stool, constipation, diarrhea, melena, nausea and vomiting.  Genitourinary: Negative.   Musculoskeletal: Negative.   Skin: Positive for rash. Negative for itching.  Neurological: Positive for dizziness and tingling. Negative for tremors, sensory change, speech change, focal weakness, seizures, loss of consciousness and headaches.  Endo/Heme/Allergies: Positive for environmental allergies. Negative for polydipsia. Bruises/bleeds easily.  Psychiatric/Behavioral: Negative.     All other ROS negative except what is listed above and in the HPI.      Objective:    BP (!) 165/60 (BP Location: Left Arm, Cuff Size: Normal)   Pulse (!) 55   Ht 5\' 10"  (1.778 m)   Wt 180 lb 7 oz (81.8 kg)   BMI 25.89 kg/m   Wt Readings from Last 3 Encounters:  08/26/17 180 lb 7 oz (81.8 kg)  08/26/17 180 lb 11.2 oz (82 kg)  05/27/17 178 lb 5 oz (80.9 kg)    Physical Exam  Constitutional: He is oriented to person, place, and time. He appears well-developed and well-nourished. No distress.  HENT:  Head: Normocephalic and atraumatic.  Right Ear: Hearing, tympanic membrane, external ear and  ear canal normal.  Left Ear: Hearing, tympanic membrane, external ear and ear canal normal.  Nose: Nose normal.  Mouth/Throat: Uvula is midline, oropharynx is clear and moist and mucous membranes are normal. No oropharyngeal exudate.  Eyes: Conjunctivae, EOM and lids are normal. Pupils are equal, round, and reactive to light. Right eye exhibits no discharge. Left eye exhibits no discharge. No scleral icterus.  Neck: Normal range of motion. Neck supple. No JVD present. No tracheal deviation present. No thyromegaly present.  Cardiovascular: Normal rate, regular rhythm, normal heart sounds and intact distal pulses. Exam reveals no gallop and no friction rub.  No murmur heard. Pulmonary/Chest: Effort normal and breath sounds normal. No stridor. No respiratory distress. He has no wheezes. He has no rales. He exhibits no tenderness.  Abdominal: Soft. Bowel sounds are normal. He exhibits no distension and no mass. There is no tenderness. There is no rebound and no guarding.  Musculoskeletal: Normal range of motion. He exhibits no edema, tenderness or deformity.  Lymphadenopathy:    He has no cervical adenopathy.  Neurological: He is alert and oriented to person, place, and time. He has normal reflexes. He displays normal reflexes. No cranial nerve deficit. He exhibits normal muscle tone. Coordination normal.  Skin: Skin is warm, dry and intact. Rash (jock itch on inner thighs) noted. He is not diaphoretic. No erythema. No pallor.  Psychiatric: He has a normal mood and affect. His speech is normal and behavior is normal. Judgment and thought content normal. Cognition and memory are normal.  Nursing note and vitals reviewed.   Results for orders placed or performed in visit on 05/27/17  VITAMIN D 25 Hydroxy (Vit-D Deficiency, Fractures)  Result Value Ref Range   Vit D, 25-Hydroxy 19.5 (L) 30.0 - 100.0 ng/mL  Comprehensive metabolic panel  Result Value Ref Range   Glucose 87 65 - 99 mg/dL   BUN 7 (L)  8 - 27 mg/dL   Creatinine, Ser 9.60 0.76 - 1.27 mg/dL   GFR calc non Af Amer 80 >59 mL/min/1.73   GFR calc Af Amer 93 >59 mL/min/1.73   BUN/Creatinine Ratio 9 (L) 10 - 24   Sodium 139 134 - 144 mmol/L   Potassium 4.4 3.5 - 5.2 mmol/L   Chloride 100 96 - 106 mmol/L   CO2 25 20 - 29 mmol/L   Calcium 9.0 8.6 - 10.2 mg/dL   Total Protein 6.3 6.0 - 8.5 g/dL   Albumin 4.3 3.5 - 4.7 g/dL   Globulin, Total 2.0 1.5 - 4.5 g/dL   Albumin/Globulin Ratio 2.2 1.2 - 2.2   Bilirubin Total 0.7 0.0 - 1.2 mg/dL   Alkaline Phosphatase 64 39 - 117 IU/L   AST 34 0 - 40 IU/L   ALT 23 0 - 44 IU/L  Lipid Panel w/o Chol/HDL Ratio  Result Value Ref Range   Cholesterol, Total 161 100 - 199 mg/dL   Triglycerides 454 (H) 0 - 149 mg/dL   HDL 40 >09 mg/dL   VLDL Cholesterol Cal 31 5 - 40 mg/dL   LDL Calculated 90 0 - 99 mg/dL      Assessment & Plan:   Problem List Items Addressed This Visit      Cardiovascular and Mediastinum   Hypertension    Not under good control. Discussed low dose lisinopril. Patient is not interested. He is aware of the risks, but doesn't want to take it.       Relevant Medications   rosuvastatin (CRESTOR) 10 MG tablet   aspirin EC 81 MG tablet   Other Relevant Orders   CBC with Differential/Platelet   Comprehensive metabolic panel   Microalbumin, Urine Waived   UA/M w/rflx Culture, Routine   CAD (coronary artery disease)    Will keep cholesterol under control. Refuses BP medicine. Denies any chest pain. Call with any concerns.       Relevant Medications   rosuvastatin (CRESTOR) 10 MG tablet   aspirin EC 81 MG tablet   Carotid atherosclerosis    Will keep cholesterol under control. Refuses BP medicine. Denies any chest pain. Call with any concerns. Continue to follow with  vascular.      Relevant Medications   rosuvastatin (CRESTOR) 10 MG tablet   aspirin EC 81 MG tablet     Other   Macular degeneration    Continue to follow with opthalmology. Call with any  concerns.       Hyperlipemia    Stable. Continue current regimen. Continue to monitor. Call with any concerns.       Relevant Medications   rosuvastatin (CRESTOR) 10 MG tablet   aspirin EC 81 MG tablet   Other Relevant Orders   CBC with Differential/Platelet   Comprehensive metabolic panel   Lipid Panel w/o Chol/HDL Ratio   UA/M w/rflx Culture, Routine    Other Visit Diagnoses    Routine general medical examination at a health care facility    -  Primary   Vaccines declined/up to date. Continue diet and exercise. Screening labs checked today. Call with any concerns.    Paresthesias       Likely due to neck arthritis. Would not like to further work up. Continue to monitor.    Relevant Orders   CBC with Differential/Platelet   Comprehensive metabolic panel   TSH   UA/M w/rflx Culture, Routine   Jock itch       Will treat with lotrisone. Call with any concerns or if not getting better or getting worse.        Discussed aspirin prophylaxis for myocardial infarction prevention and decision was made to continue ASA  LABORATORY TESTING:  Health maintenance labs ordered today as discussed above.   IMMUNIZATIONS:   - Tdap: Tetanus vaccination status reviewed: Refused. - Influenza: Up to date - Pneumovax: Up to date - Prevnar: Refused  SCREENING: - Colonoscopy: Not applicable  Discussed with patient purpose of the colonoscopy is to detect colon cancer at curable precancerous or early stages   PATIENT COUNSELING:    Sexuality: Discussed sexually transmitted diseases, partner selection, use of condoms, avoidance of unintended pregnancy  and contraceptive alternatives.   Advised to avoid cigarette smoking.  I discussed with the patient that most people either abstain from alcohol or drink within safe limits (<=14/week and <=4 drinks/occasion for males, <=7/weeks and <= 3 drinks/occasion for females) and that the risk for alcohol disorders and other health effects rises  proportionally with the number of drinks per week and how often a drinker exceeds daily limits.  Discussed cessation/primary prevention of drug use and availability of treatment for abuse.   Diet: Encouraged to adjust caloric intake to maintain  or achieve ideal body weight, to reduce intake of dietary saturated fat and total fat, to limit sodium intake by avoiding high sodium foods and not adding table salt, and to maintain adequate dietary potassium and calcium preferably from fresh fruits, vegetables, and low-fat dairy products.    stressed the importance of regular exercise  Injury prevention: Discussed safety belts, safety helmets, smoke detector, smoking near bedding or upholstery.   Dental health: Discussed importance of regular tooth brushing, flossing, and dental visits.   Follow up plan: NEXT PREVENTATIVE PHYSICAL DUE IN 1 YEAR. Return in about 6 months (around 02/26/2018).

## 2017-08-26 NOTE — Patient Instructions (Addendum)
Cervical Strain and Sprain Rehab Ask your health care provider which exercises are safe for you. Do exercises exactly as told by your health care provider and adjust them as directed. It is normal to feel mild stretching, pulling, tightness, or discomfort as you do these exercises, but you should stop right away if you feel sudden pain or your pain gets worse.Do not begin these exercises until told by your health care provider. Stretching and range of motion exercises These exercises warm up your muscles and joints and improve the movement and flexibility of your neck. These exercises also help to relieve pain, numbness, and tingling. Exercise A: Cervical side bend  1. Using good posture, sit on a stable chair or stand up. 2. Without moving your shoulders, slowly tilt your left / right ear to your shoulder until you feel a stretch in your neck muscles. You should be looking straight ahead. 3. Hold for __________ seconds. 4. Repeat with the other side of your neck. Repeat __________ times. Complete this exercise __________ times a day. Exercise B: Cervical rotation  1. Using good posture, sit on a stable chair or stand up. 2. Slowly turn your head to the side as if you are looking over your left / right shoulder. ? Keep your eyes level with the ground. ? Stop when you feel a stretch along the side and the back of your neck. 3. Hold for __________ seconds. 4. Repeat this by turning to your other side. Repeat __________ times. Complete this exercise __________ times a day. Exercise C: Thoracic extension and pectoral stretch 1. Roll a towel or a small blanket so it is about 4 inches (10 cm) in diameter. 2. Lie down on your back on a firm surface. 3. Put the towel lengthwise, under your spine in the middle of your back. It should not be not under your shoulder blades. The towel should line up with your spine from your middle back to your lower back. 4. Put your hands behind your head and let your  elbows fall out to your sides. 5. Hold for __________ seconds. Repeat __________ times. Complete this exercise __________ times a day. Strengthening exercises These exercises build strength and endurance in your neck. Endurance is the ability to use your muscles for a long time, even after your muscles get tired. Exercise D: Upper cervical flexion, isometric 1. Lie on your back with a thin pillow behind your head and a small rolled-up towel under your neck. 2. Gently tuck your chin toward your chest and nod your head down to look toward your feet. Do not lift your head off the pillow. 3. Hold for __________ seconds. 4. Release the tension slowly. Relax your neck muscles completely before you repeat this exercise. Repeat __________ times. Complete this exercise __________ times a day. Exercise E: Cervical extension, isometric  1. Stand about 6 inches (15 cm) away from a wall, with your back facing the wall. 2. Place a soft object, about 6-8 inches (15-20 cm) in diameter, between the back of your head and the wall. A soft object could be a small pillow, a ball, or a folded towel. 3. Gently tilt your head back and press into the soft object. Keep your jaw and forehead relaxed. 4. Hold for __________ seconds. 5. Release the tension slowly. Relax your neck muscles completely before you repeat this exercise. Repeat __________ times. Complete this exercise __________ times a day. Posture and body mechanics  Body mechanics refers to the movements and positions of   your body while you do your daily activities. Posture is part of body mechanics. Good posture and healthy body mechanics can help to relieve stress in your body's tissues and joints. Good posture means that your spine is in its natural S-curve position (your spine is neutral), your shoulders are pulled back slightly, and your head is not tipped forward. The following are general guidelines for applying improved posture and body mechanics to  your everyday activities. Standing  When standing, keep your spine neutral and keep your feet about hip-width apart. Keep a slight bend in your knees. Your ears, shoulders, and hips should line up.  When you do a task in which you stand in one place for a long time, place one foot up on a stable object that is 2-4 inches (5-10 cm) high, such as a footstool. This helps keep your spine neutral. Sitting   When sitting, keep your spine neutral and your keep feet flat on the floor. Use a footrest, if necessary, and keep your thighs parallel to the floor. Avoid rounding your shoulders, and avoid tilting your head forward.  When working at a desk or a computer, keep your desk at a height where your hands are slightly lower than your elbows. Slide your chair under your desk so you are close enough to maintain good posture.  When working at a computer, place your monitor at a height where you are looking straight ahead and you do not have to tilt your head forward or downward to look at the screen. Resting When lying down and resting, avoid positions that are most painful for you. Try to support your neck in a neutral position. You can use a contour pillow or a small rolled-up towel. Your pillow should support your neck but not push on it. This information is not intended to replace advice given to you by your health care provider. Make sure you discuss any questions you have with your health care provider. Document Released: 06/09/2005 Document Revised: 02/14/2016 Document Reviewed: 05/16/2015 Elsevier Interactive Patient Education  2018 Elsevier Inc.  

## 2017-08-26 NOTE — Assessment & Plan Note (Signed)
Continue to follow with opthalmology. Call with any concerns.  

## 2017-08-26 NOTE — Progress Notes (Signed)
Subjective:   Nicholas Gross is a 84 y.o. male who presents for Medicare Annual/Subsequent preventive examination.  Review of Systems:   Cardiac Risk Factors include: male gender;hypertension;advanced age (>4men, >43 women);dyslipidemia     Objective:    Vitals: BP (!) 189/76 (BP Location: Left Arm, Patient Position: Sitting)   Pulse (!) 55   Temp 97.8 F (36.6 C) (Temporal)   Resp 15   Ht 5\' 10"  (1.778 m)   Wt 180 lb 11.2 oz (82 kg)   BMI 25.93 kg/m   Body mass index is 25.93 kg/m.  Advanced Directives 08/26/2017  Does Patient Have a Medical Advance Directive? No  Would patient like information on creating a medical advance directive? No - Patient declined    Tobacco Social History   Tobacco Use  Smoking Status Former Smoker  . Packs/day: 1.00  . Years: 17.00  . Pack years: 17.00  . Types: Cigarettes  . Last attempt to quit: 06/23/1968  . Years since quitting: 49.2  Smokeless Tobacco Never Used     Counseling given: Not Answered   Clinical Intake:  Pre-visit preparation completed: Yes  Pain : No/denies pain     Nutritional Status: BMI 25 -29 Overweight Nutritional Risks: None Diabetes: No  How often do you need to have someone help you when you read instructions, pamphlets, or other written materials from your doctor or pharmacy?: 1 - Never What is the last grade level you completed in school?: high school, some college   Interpreter Needed?: No  Information entered by :: Nicholas Hill,LPN   Past Medical History:  Diagnosis Date  . CAD (coronary artery disease)   . History of GI bleed 2008  . History of tobacco use    17 pack years, quit around 1970  . Hyperlipidemia   . Hypertension   . Overweight    Past Surgical History:  Procedure Laterality Date  . CAROTID ENDARTERECTOMY Left 2010  . CATARACT EXTRACTION    . cypher stent  09/12/02   s/p cypher stent mid-LAD  . TENDON REPAIR  1991   finger and arm   Family History  Problem  Relation Age of Onset  . Heart disease Father        possibly  . Cancer Sister        breast  . AAA (abdominal aortic aneurysm) Brother   . Cancer Sister        lung   Social History   Socioeconomic History  . Marital status: Widowed    Spouse name: None  . Number of children: None  . Years of education: None  . Highest education level: None  Social Needs  . Financial resource strain: Not hard at all  . Food insecurity - worry: Never true  . Food insecurity - inability: Never true  . Transportation needs - medical: No  . Transportation needs - non-medical: No  Occupational History  . None  Tobacco Use  . Smoking status: Former Smoker    Packs/day: 1.00    Years: 17.00    Pack years: 17.00    Types: Cigarettes    Last attempt to quit: 06/23/1968    Years since quitting: 49.2  . Smokeless tobacco: Never Used  Substance and Sexual Activity  . Alcohol use: No  . Drug use: No  . Sexual activity: None  Other Topics Concern  . None  Social History Narrative  . None    Outpatient Encounter Medications as of 08/26/2017  Medication Sig  .  aspirin EC 81 MG tablet Take 1 tablet (81 mg total) by mouth daily.  . Multiple Vitamins-Minerals (PRESERVISION AREDS PO) Take by mouth 2 (two) times daily.  . Omega-3 Fatty Acids (FISH OIL PO) Take by mouth daily.  . rosuvastatin (CRESTOR) 10 MG tablet Take 1 tablet (10 mg total) by mouth daily.  . Vitamin D, Ergocalciferol, (DRISDOL) 50000 units CAPS capsule Take 1 capsule (50,000 Units total) by mouth every 7 (seven) days.   No facility-administered encounter medications on file as of 08/26/2017.     Activities of Daily Living In your present state of health, do you have any difficulty performing the following activities: 08/26/2017  Hearing? N  Vision? Y  Comment macular degeneration  Difficulty concentrating or making decisions? Y  Walking or climbing stairs? Y  Dressing or bathing? N  Doing errands, shopping? Y  Comment has  assistance with Facilities managertransportation   Preparing Food and eating ? N  Using the Toilet? N  In the past six months, have you accidently leaked urine? N  Do you have problems with loss of bowel control? N  Managing your Medications? N  Managing your Finances? N  Housekeeping or managing your Housekeeping? N  Some recent data might be hidden    Patient Care Team: Dorcas CarrowJohnson, Megan P, DO as PCP - General (Family Medicine) Wyn Quakerew, Marlow BaarsJason S, MD as Referring Physician (Vascular Surgery)   Assessment:   This is a routine wellness examination for Nicholas FavreOscar.  Exercise Activities and Dietary recommendations Current Exercise Habits: Home exercise routine, Type of exercise: walking, Time (Minutes): 20, Frequency (Times/Week): 6, Weekly Exercise (Minutes/Week): 120, Intensity: Mild, Exercise limited by: None identified  Goals    . DIET - INCREASE WATER INTAKE     Recommend drinking at least 6-8 glasses of water a day       Fall Risk Fall Risk  08/26/2017 08/21/2016 08/15/2015  Falls in the past year? No No No   Is the patient's home free of loose throw rugs in walkways, pet beds, electrical cords, etc?   yes      Grab bars in the bathroom? no      Handrails on the stairs?   yes      Adequate lighting?   yes  Timed Get Up and Go Performed: Completed in 8 seconds with no use of assistive devices, steady gait. No intervention needed at this time.   Depression Screen PHQ 2/9 Scores 08/26/2017 08/21/2016 08/15/2015  PHQ - 2 Score 0 0 0  PHQ- 9 Score 1 - -    Cognitive Function     6CIT Screen 08/26/2017 08/21/2016  What Year? 0 points 0 points  What month? 0 points 0 points  What time? 0 points 0 points  Count back from 20 0 points 0 points  Months in reverse 0 points 0 points  Repeat phrase 4 points 2 points  Total Score 4 2    Immunization History  Administered Date(s) Administered  . Influenza-Unspecified 04/18/2015, 04/10/2016, 04/21/2017  . Pneumococcal Polysaccharide-23 06/23/1998    Qualifies for  Shingles Vaccine? no  Screening Tests Health Maintenance  Topic Date Due  . TETANUS/TDAP  07/10/1948  . PNA vac Low Risk Adult (2 of 2 - PCV13) 06/24/1999  . INFLUENZA VACCINE  Completed   Cancer Screenings: Lung: Low Dose CT Chest recommended if Age 39-80 years, 30 pack-year currently smoking OR have quit w/in 15years. Patient does not qualify. Colorectal: no longer required  Additional Screenings:  Hepatitis B/HIV/Syphillis: not indicated  Hepatitis C Screening: not indicated    Plan:    I have personally reviewed and addressed the Medicare Annual Wellness questionnaire and have noted the following in the patient's chart:  A. Medical and social history B. Use of alcohol, tobacco or illicit drugs  C. Current medications and supplements D. Functional ability and status E.  Nutritional status F.  Physical activity G. Advance directives H. List of other physicians I.  Hospitalizations, surgeries, and ER visits in previous 12 months J.  Vitals K. Screenings such as hearing and vision if needed, cognitive and depression L. Referrals and appointments   In addition, I have reviewed and discussed with patient certain preventive protocols, quality metrics, and best practice recommendations. A written personalized care plan for preventive services as well as general preventive health recommendations were provided to patient.   Signed,  Marin Roberts, LPN Nurse Health Advisor   Nurse Notes:none

## 2017-08-26 NOTE — Assessment & Plan Note (Signed)
Not under good control. Discussed low dose lisinopril. Patient is not interested. He is aware of the risks, but doesn't want to take it.

## 2017-08-27 ENCOUNTER — Encounter: Payer: Self-pay | Admitting: Family Medicine

## 2017-08-27 LAB — CBC WITH DIFFERENTIAL/PLATELET
BASOS ABS: 0 10*3/uL (ref 0.0–0.2)
Basos: 1 %
EOS (ABSOLUTE): 0.1 10*3/uL (ref 0.0–0.4)
EOS: 2 %
HEMATOCRIT: 43.7 % (ref 37.5–51.0)
Hemoglobin: 15.1 g/dL (ref 13.0–17.7)
IMMATURE GRANULOCYTES: 0 %
Immature Grans (Abs): 0 10*3/uL (ref 0.0–0.1)
Lymphocytes Absolute: 1.4 10*3/uL (ref 0.7–3.1)
Lymphs: 25 %
MCH: 32.5 pg (ref 26.6–33.0)
MCHC: 34.6 g/dL (ref 31.5–35.7)
MCV: 94 fL (ref 79–97)
Monocytes Absolute: 0.4 10*3/uL (ref 0.1–0.9)
Monocytes: 7 %
NEUTROS PCT: 65 %
Neutrophils Absolute: 3.7 10*3/uL (ref 1.4–7.0)
PLATELETS: 221 10*3/uL (ref 150–379)
RBC: 4.64 x10E6/uL (ref 4.14–5.80)
RDW: 12.9 % (ref 12.3–15.4)
WBC: 5.7 10*3/uL (ref 3.4–10.8)

## 2017-08-27 LAB — COMPREHENSIVE METABOLIC PANEL
ALK PHOS: 57 IU/L (ref 39–117)
ALT: 29 IU/L (ref 0–44)
AST: 31 IU/L (ref 0–40)
Albumin/Globulin Ratio: 2.4 — ABNORMAL HIGH (ref 1.2–2.2)
Albumin: 4.4 g/dL (ref 3.5–4.7)
BUN/Creatinine Ratio: 8 — ABNORMAL LOW (ref 10–24)
BUN: 7 mg/dL — ABNORMAL LOW (ref 8–27)
Bilirubin Total: 0.5 mg/dL (ref 0.0–1.2)
CALCIUM: 9.1 mg/dL (ref 8.6–10.2)
CO2: 24 mmol/L (ref 20–29)
CREATININE: 0.83 mg/dL (ref 0.76–1.27)
Chloride: 102 mmol/L (ref 96–106)
GFR calc Af Amer: 91 mL/min/{1.73_m2} (ref >59)
GFR calc non Af Amer: 79 mL/min/{1.73_m2} (ref >59)
GLUCOSE: 94 mg/dL (ref 65–99)
Globulin, Total: 1.8 g/dL (ref 1.5–4.5)
Potassium: 4.1 mmol/L (ref 3.5–5.2)
Sodium: 141 mmol/L (ref 134–144)
Total Protein: 6.2 g/dL (ref 6.0–8.5)

## 2017-08-27 LAB — LIPID PANEL W/O CHOL/HDL RATIO
CHOLESTEROL TOTAL: 116 mg/dL (ref 100–199)
HDL: 44 mg/dL (ref >39)
LDL Calculated: 41 mg/dL (ref 0–99)
TRIGLYCERIDES: 154 mg/dL — AB (ref 0–149)
VLDL Cholesterol Cal: 31 mg/dL (ref 5–40)

## 2017-08-27 LAB — TSH: TSH: 3.45 u[IU]/mL (ref 0.450–4.500)

## 2017-09-22 DIAGNOSIS — H353222 Exudative age-related macular degeneration, left eye, with inactive choroidal neovascularization: Secondary | ICD-10-CM | POA: Diagnosis not present

## 2017-09-22 DIAGNOSIS — H353114 Nonexudative age-related macular degeneration, right eye, advanced atrophic with subfoveal involvement: Secondary | ICD-10-CM | POA: Diagnosis not present

## 2018-02-26 ENCOUNTER — Ambulatory Visit: Payer: Medicare Other | Admitting: Family Medicine

## 2018-02-26 ENCOUNTER — Encounter: Payer: Self-pay | Admitting: Family Medicine

## 2018-02-26 ENCOUNTER — Other Ambulatory Visit: Payer: Self-pay

## 2018-02-26 VITALS — BP 164/71 | HR 58 | Temp 97.8°F | Ht 70.0 in | Wt 175.0 lb

## 2018-02-26 DIAGNOSIS — I2510 Atherosclerotic heart disease of native coronary artery without angina pectoris: Secondary | ICD-10-CM | POA: Diagnosis not present

## 2018-02-26 DIAGNOSIS — E782 Mixed hyperlipidemia: Secondary | ICD-10-CM | POA: Diagnosis not present

## 2018-02-26 DIAGNOSIS — Z23 Encounter for immunization: Secondary | ICD-10-CM

## 2018-02-26 DIAGNOSIS — I10 Essential (primary) hypertension: Secondary | ICD-10-CM | POA: Diagnosis not present

## 2018-02-26 NOTE — Progress Notes (Signed)
BP (!) 164/71   Pulse (!) 58   Temp 97.8 F (36.6 C) (Oral)   Ht 5\' 10"  (1.778 m)   Wt 175 lb (79.4 kg)   SpO2 98%   BMI 25.11 kg/m    Subjective:    Patient ID: Nicholas Gross, male    DOB: 1927/11/05, 84 y.o.   MRN: [id number]  HPI: Nicholas Gross is a 84 y.o. male  Chief Complaint  Patient presents with  . Hypertension    33m f/u  . Hyperlipidemia   HYPERTENSION / HYPERLIPIDEMIA Satisfied with current treatment? yes Duration of hypertension: chronic BP monitoring frequency: not checking BP medication side effects: Refuses BP medicine Past BP meds: none Duration of hyperlipidemia: chronic Cholesterol medication side effects: no Cholesterol supplements: fish oil Past cholesterol medications: crestor Medication compliance: excellent compliance Aspirin: yes Recent stressors: no Recurrent headaches: no Visual changes: no Palpitations: no Dyspnea: no Chest pain: no Lower extremity edema: no Dizzy/lightheaded: no  Relevant past medical, surgical, family and social history reviewed and updated as indicated. Interim medical history since our last visit reviewed. Allergies and medications reviewed and updated.  Review of Systems  Constitutional: Negative.   Respiratory: Negative.   Cardiovascular: Negative.   Musculoskeletal: Negative.   Psychiatric/Behavioral: Negative.     Per HPI unless specifically indicated above     Objective:    BP (!) 164/71   Pulse (!) 58   Temp 97.8 F (36.6 C) (Oral)   Ht 5\' 10"  (1.778 m)   Wt 175 lb (79.4 kg)   SpO2 98%   BMI 25.11 kg/m   Wt Readings from Last 3 Encounters:  02/26/18 175 lb (79.4 kg)  08/26/17 180 lb 7 oz (81.8 kg)  08/26/17 180 lb 11.2 oz (82 kg)    Physical Exam  Constitutional: He is oriented to person, place, and time. He appears well-developed and well-nourished. No distress.  HENT:  Head: Normocephalic and atraumatic.  Right Ear: Hearing normal.  Left Ear: Hearing normal.  Nose: Nose  normal.  Eyes: Conjunctivae and lids are normal. Right eye exhibits no discharge. Left eye exhibits no discharge. No scleral icterus.  Cardiovascular: Normal rate, regular rhythm, normal heart sounds and intact distal pulses. Exam reveals no gallop and no friction rub.  No murmur heard. Pulmonary/Chest: Effort normal and breath sounds normal. No stridor. No respiratory distress. He has no wheezes. He has no rales. He exhibits no tenderness.  Musculoskeletal: Normal range of motion.  Neurological: He is alert and oriented to person, place, and time.  Skin: Skin is warm, dry and intact. Capillary refill takes less than 2 seconds. No rash noted. He is not diaphoretic. No erythema. No pallor.  Psychiatric: He has a normal mood and affect. His speech is normal and behavior is normal. Judgment and thought content normal. Cognition and memory are normal.  Nursing note and vitals reviewed.   Results for orders placed or performed in visit on 08/26/17  Microscopic Examination  Result Value Ref Range   WBC, UA 0-5 0 - 5 /hpf   RBC, UA 0-2 0 - 2 /hpf   Epithelial Cells (non renal) CANCELED    Bacteria, UA None seen None seen/Few  CBC with Differential/Platelet  Result Value Ref Range   WBC 5.7 3.4 - 10.8 x10E3/uL   RBC 4.64 4.14 - 5.80 x10E6/uL   Hemoglobin 15.1 13.0 - 17.7 g/dL   Hematocrit 40.9 81.1 - 51.0 %   MCV 94 79 - 97 fL  MCH 32.5 26.6 - 33.0 pg   MCHC 34.6 31.5 - 35.7 g/dL   RDW 09.6 04.5 - 40.9 %   Platelets 221 150 - 379 x10E3/uL   Neutrophils 65 Not Estab. %   Lymphs 25 Not Estab. %   Monocytes 7 Not Estab. %   Eos 2 Not Estab. %   Basos 1 Not Estab. %   Neutrophils Absolute 3.7 1.4 - 7.0 x10E3/uL   Lymphocytes Absolute 1.4 0.7 - 3.1 x10E3/uL   Monocytes Absolute 0.4 0.1 - 0.9 x10E3/uL   EOS (ABSOLUTE) 0.1 0.0 - 0.4 x10E3/uL   Basophils Absolute 0.0 0.0 - 0.2 x10E3/uL   Immature Granulocytes 0 Not Estab. %   Immature Grans (Abs) 0.0 0.0 - 0.1 x10E3/uL  Comprehensive  metabolic panel  Result Value Ref Range   Glucose 94 65 - 99 mg/dL   BUN 7 (L) 8 - 27 mg/dL   Creatinine, Ser 8.11 0.76 - 1.27 mg/dL   GFR calc non Af Amer 79 >59 mL/min/1.73   GFR calc Af Amer 91 >59 mL/min/1.73   BUN/Creatinine Ratio 8 (L) 10 - 24   Sodium 141 134 - 144 mmol/L   Potassium 4.1 3.5 - 5.2 mmol/L   Chloride 102 96 - 106 mmol/L   CO2 24 20 - 29 mmol/L   Calcium 9.1 8.6 - 10.2 mg/dL   Total Protein 6.2 6.0 - 8.5 g/dL   Albumin 4.4 3.5 - 4.7 g/dL   Globulin, Total 1.8 1.5 - 4.5 g/dL   Albumin/Globulin Ratio 2.4 (H) 1.2 - 2.2   Bilirubin Total 0.5 0.0 - 1.2 mg/dL   Alkaline Phosphatase 57 39 - 117 IU/L   AST 31 0 - 40 IU/L   ALT 29 0 - 44 IU/L  Lipid Panel w/o Chol/HDL Ratio  Result Value Ref Range   Cholesterol, Total 116 100 - 199 mg/dL   Triglycerides 914 (H) 0 - 149 mg/dL   HDL 44 >78 mg/dL   VLDL Cholesterol Cal 31 5 - 40 mg/dL   LDL Calculated 41 0 - 99 mg/dL  Microalbumin, Urine Waived  Result Value Ref Range   Microalb, Ur Waived 10 0 - 19 mg/L   Creatinine, Urine Waived 50 10 - 300 mg/dL   Microalb/Creat Ratio 30-300 (H) <30 mg/g  TSH  Result Value Ref Range   TSH 3.450 0.450 - 4.500 uIU/mL  UA/M w/rflx Culture, Routine  Result Value Ref Range   Specific Gravity, UA 1.010 1.005 - 1.030   pH, UA 7.5 5.0 - 7.5   Color, UA Yellow Yellow   Appearance Ur Clear Clear   Leukocytes, UA Negative Negative   Protein, UA Negative Negative/Trace   Glucose, UA Negative Negative   Ketones, UA Negative Negative   RBC, UA Trace (A) Negative   Bilirubin, UA Negative Negative   Urobilinogen, Ur 0.2 0.2 - 1.0 mg/dL   Nitrite, UA Negative Negative   Microscopic Examination See below:       Assessment & Plan:   Problem List Items Addressed This Visit      Cardiovascular and Mediastinum   Hypertension - Primary    Refused blood pressure medicine. Will keep cholesterol under good control. He is aware of the risks.      Relevant Orders   CBC with  Differential/Platelet   Comprehensive metabolic panel   CAD (coronary artery disease)    Refused blood pressure medicine. Will keep cholesterol under good control. He is aware of the risks.  Relevant Orders   CBC with Differential/Platelet   Comprehensive metabolic panel     Other   Hyperlipemia    Under good control on current regimen. Continue current regimen. Continue to monitor. Call with any concerns. Refills given.        Relevant Orders   CBC with Differential/Platelet   Comprehensive metabolic panel   Lipid Panel w/o Chol/HDL Ratio    Other Visit Diagnoses    Flu vaccine need       Flu shot given today.   Relevant Orders   Flu vaccine HIGH DOSE PF (Completed)       Follow up plan: Return in about 6 months (around 08/27/2018) for Physical and wellness.

## 2018-02-26 NOTE — Assessment & Plan Note (Signed)
Under good control on current regimen. Continue current regimen. Continue to monitor. Call with any concerns. Refills given.   

## 2018-02-26 NOTE — Assessment & Plan Note (Signed)
Refused blood pressure medicine. Will keep cholesterol under good control. He is aware of the risks. 

## 2018-02-26 NOTE — Assessment & Plan Note (Signed)
Refused blood pressure medicine. Will keep cholesterol under good control. He is aware of the risks.

## 2018-02-27 LAB — COMPREHENSIVE METABOLIC PANEL
ALBUMIN: 4.4 g/dL (ref 3.5–4.7)
ALT: 25 IU/L (ref 0–44)
AST: 31 IU/L (ref 0–40)
Albumin/Globulin Ratio: 2.6 — ABNORMAL HIGH (ref 1.2–2.2)
Alkaline Phosphatase: 59 IU/L (ref 39–117)
BUN/Creatinine Ratio: 9 — ABNORMAL LOW (ref 10–24)
BUN: 7 mg/dL — AB (ref 8–27)
Bilirubin Total: 0.7 mg/dL (ref 0.0–1.2)
CO2: 24 mmol/L (ref 20–29)
CREATININE: 0.8 mg/dL (ref 0.76–1.27)
Calcium: 9 mg/dL (ref 8.6–10.2)
Chloride: 101 mmol/L (ref 96–106)
GFR calc non Af Amer: 80 mL/min/{1.73_m2} (ref >59)
GFR, EST AFRICAN AMERICAN: 92 mL/min/{1.73_m2} (ref >59)
Globulin, Total: 1.7 g/dL (ref 1.5–4.5)
Glucose: 90 mg/dL (ref 65–99)
Potassium: 4.5 mmol/L (ref 3.5–5.2)
Sodium: 139 mmol/L (ref 134–144)
TOTAL PROTEIN: 6.1 g/dL (ref 6.0–8.5)

## 2018-02-27 LAB — CBC WITH DIFFERENTIAL/PLATELET
BASOS ABS: 0.1 10*3/uL (ref 0.0–0.2)
Basos: 1 %
EOS (ABSOLUTE): 0.2 10*3/uL (ref 0.0–0.4)
Eos: 3 %
Hematocrit: 40.5 % (ref 37.5–51.0)
Hemoglobin: 14 g/dL (ref 13.0–17.7)
IMMATURE GRANS (ABS): 0 10*3/uL (ref 0.0–0.1)
Immature Granulocytes: 1 %
LYMPHS ABS: 1.4 10*3/uL (ref 0.7–3.1)
LYMPHS: 22 %
MCH: 31.9 pg (ref 26.6–33.0)
MCHC: 34.6 g/dL (ref 31.5–35.7)
MCV: 92 fL (ref 79–97)
MONOS ABS: 0.6 10*3/uL (ref 0.1–0.9)
Monocytes: 9 %
NEUTROS ABS: 4.2 10*3/uL (ref 1.4–7.0)
Neutrophils: 64 %
PLATELETS: 233 10*3/uL (ref 150–450)
RBC: 4.39 x10E6/uL (ref 4.14–5.80)
RDW: 12.4 % (ref 12.3–15.4)
WBC: 6.6 10*3/uL (ref 3.4–10.8)

## 2018-02-27 LAB — LIPID PANEL W/O CHOL/HDL RATIO
Cholesterol, Total: 112 mg/dL (ref 100–199)
HDL: 47 mg/dL (ref >39)
LDL CALC: 46 mg/dL (ref 0–99)
Triglycerides: 93 mg/dL (ref 0–149)
VLDL CHOLESTEROL CAL: 19 mg/dL (ref 5–40)

## 2018-03-01 ENCOUNTER — Encounter: Payer: Self-pay | Admitting: Family Medicine

## 2018-03-23 DIAGNOSIS — H353114 Nonexudative age-related macular degeneration, right eye, advanced atrophic with subfoveal involvement: Secondary | ICD-10-CM | POA: Diagnosis not present

## 2018-05-22 ENCOUNTER — Other Ambulatory Visit: Payer: Self-pay | Admitting: Family Medicine

## 2018-05-24 NOTE — Telephone Encounter (Signed)
Requested medication (s) are due for refill today: yes  Requested medication (s) are on the active medication list: yes  Last refill:  08/26/17 for 90 and 1 refill  Future visit scheduled: yes  Notes to clinic:  none  Requested Prescriptions  Pending Prescriptions Disp Refills   rosuvastatin (CRESTOR) 10 MG tablet [Pharmacy Med Name: ROSUVASTATIN CALCIUM 10 MG TAB] 90 tablet 1    Sig: TAKE 1 TABLET BY MOUTH ONCE DAILY.     Cardiovascular:  Antilipid - Statins Passed - 05/22/2018  8:45 AM      Passed - Total Cholesterol in normal range and within 360 days    Cholesterol, Total  Date Value Ref Range Status  02/26/2018 112 100 - 199 mg/dL Final   Cholesterol Piccolo, Waived  Date Value Ref Range Status  02/13/2016 123 <200 mg/dL Final    Comment:                            Desirable                <200                         Borderline High      200- 239                         High                     >239          Passed - LDL in normal range and within 360 days    LDL Calculated  Date Value Ref Range Status  02/26/2018 46 0 - 99 mg/dL Final         Passed - HDL in normal range and within 360 days    HDL  Date Value Ref Range Status  02/26/2018 47 >39 mg/dL Final         Passed - Triglycerides in normal range and within 360 days    Triglycerides  Date Value Ref Range Status  02/26/2018 93 0 - 149 mg/dL Final   Triglycerides Piccolo,Waived  Date Value Ref Range Status  02/13/2016 114 <150 mg/dL Final    Comment:                            Normal                   <150                         Borderline High     150 - 199                         High                200 - 499                         Very High                >499          Passed - Patient is not pregnant      Passed - Valid encounter within last 12 months    Recent Outpatient Visits  2 months ago Essential hypertension   [facility] [place], Megan P, DO   9 months  ago Routine general medical examination at a health care facility   [facility], [place], [place]   12 months ago Mixed hyperlipidemia   [facility] [place], [place]ley, DO   1 year ago Essential hypertension   [facility] [place], [place], DO   1 year ago Essential hypertension   [facility] [place], Oralia Rud, DO      Future Appointments            In 3 months Laural Benes, Oralia Rud, DO [facility]   In 3 months  [facility], [facility]

## 2018-08-29 NOTE — Progress Notes (Signed)
BP (!) 173/77 (BP Location: Left Arm, Cuff Size: Normal)   Pulse (!) 56   Temp 98 F (36.7 C) (Oral)   Ht 5' 9.7" (1.77 m)   Wt 183 lb 8 oz (83.2 kg)   SpO2 100%   BMI 26.56 kg/m    Subjective:    Patient ID: Nicholas Gross, male    DOB: 26-Nov-1927, 85 y.o.   MRN: [id number]  HPI: Nicholas Gross is a 85 y.o. male presenting on 08/30/2018 for comprehensive medical examination. Current medical complaints include:  HYPERTENSION / HYPERLIPIDEMIA Satisfied with current treatment? yes Duration of hypertension: chronic BP monitoring frequency: not checking BP medication side effects: not on anything Past BP meds: none- refuses Duration of hyperlipidemia: chronic Cholesterol medication side effects: no Cholesterol supplements: fish oil Past cholesterol medications: crestor Medication compliance: excellent compliance Aspirin: yes Recent stressors: no Recurrent headaches: no Visual changes: no Palpitations: no Dyspnea: no Chest pain: no Lower extremity edema: no Dizzy/lightheaded: no  Interim Problems from his last visit: no  Depression Screen done today and results listed below:  Depression screen [facility] 2/9 08/30/2018 08/26/2017 08/21/2016 08/15/2015  Decreased Interest 0 0 0 0  Down, Depressed, Hopeless 0 0 0 0  PHQ - 2 Score 0 0 0 0  Altered sleeping 0 0 - -  Tired, decreased energy 0 0 - -  Change in appetite 0 0 - -  Feeling bad or failure about yourself  0 0 - -  Trouble concentrating 0 1 - -  Moving slowly or fidgety/restless 0 0 - -  Suicidal thoughts 0 0 - -  PHQ-9 Score 0 1 - -  Difficult doing work/chores Not difficult at all Not difficult at all - -    Past Medical History:  Past Medical History:  Diagnosis Date  . CAD (coronary artery disease)   . History of GI bleed 2008  . History of tobacco use    17 pack years, quit around 1970  . Hyperlipidemia   . Hypertension   . Overweight     Surgical History:  Past Surgical History:  Procedure Laterality  Date  . CAROTID ENDARTERECTOMY Left 2010  . CATARACT EXTRACTION    . cypher stent  09/12/02   s/p cypher stent mid-LAD  . TENDON REPAIR  1991   finger and arm    Medications:  Current Outpatient Medications on File Prior to Visit  Medication Sig  . Multiple Vitamins-Minerals (PRESERVISION AREDS PO) Take by mouth 2 (two) times daily.  . Omega-3 Fatty Acids (FISH OIL PO) Take by mouth daily.   No current facility-administered medications on file prior to visit.     Allergies:  Allergies  Allergen Reactions  . Other     Patient states that he is allergic to something that they place in the IV before they work on you  . Simvastatin Other (See Comments)    Myalgia     Social History:  Social History   Socioeconomic History  . Marital status: Widowed    Spouse name: Not on file  . Number of children: Not on file  . Years of education: some college in [employer]   . Highest education level: High school graduate  Occupational History  . Occupation: retired   Engineer, production  . Financial resource strain: Not hard at all  . Food insecurity:    Worry: Never true    Inability: Never true  . Transportation needs:    Medical: No  Non-medical: No  Tobacco Use  . Smoking status: Former Smoker    Packs/day: 1.00    Years: 17.00    Pack years: 17.00    Types: Cigarettes    Last attempt to quit: 06/23/1968    Years since quitting: 50.2  . Smokeless tobacco: Never Used  Substance and Sexual Activity  . Alcohol use: No  . Drug use: No  . Sexual activity: Not on file  Lifestyle  . Physical activity:    Days per week: 0 days    Minutes per session: 0 min  . Stress: Not at all  Relationships  . Social connections:    Talks on phone: Once a week    Gets together: Once a week    Attends religious service: More than 4 times per year    Active member of club or organization: No    Attends meetings of clubs or organizations: Never    Relationship status: Widowed  . Intimate partner  violence:    Fear of current or ex partner: No    Emotionally abused: No    Physically abused: No    Forced sexual activity: No  Other Topics Concern  . Not on file  Social History Narrative   Attends church, neighbor take him    Social History   Tobacco Use  Smoking Status Former Smoker  . Packs/day: 1.00  . Years: 17.00  . Pack years: 17.00  . Types: Cigarettes  . Last attempt to quit: 06/23/1968  . Years since quitting: 50.2  Smokeless Tobacco Never Used   Social History   Substance and Sexual Activity  Alcohol Use No    Family History:  Family History  Problem Relation Age of Onset  . Heart disease Father        possibly  . Cancer Sister        breast  . AAA (abdominal aortic aneurysm) Brother   . Cancer Sister        lung    Past medical history, surgical history, medications, allergies, family history and social history reviewed with patient today and changes made to appropriate areas of the chart.   Review of Systems  Constitutional: Negative.   HENT: Negative.   Eyes: Positive for blurred vision. Negative for double vision, photophobia, pain, discharge and redness.  Respiratory: Negative.   Cardiovascular: Negative.   Gastrointestinal: Negative.   Genitourinary: Negative.   Musculoskeletal: Positive for joint pain. Negative for back pain, falls, myalgias and neck pain.  Skin: Positive for rash. Negative for itching.  Neurological: Negative.   Endo/Heme/Allergies: Negative.   Psychiatric/Behavioral: Negative.     All other ROS negative except what is listed above and in the HPI.      Objective:    BP (!) 173/77 (BP Location: Left Arm, Cuff Size: Normal)   Pulse (!) 56   Temp 98 F (36.7 C) (Oral)   Ht 5' 9.7" (1.77 m)   Wt 183 lb 8 oz (83.2 kg)   SpO2 100%   BMI 26.56 kg/m   Wt Readings from Last 3 Encounters:  08/30/18 183 lb 12.8 oz (83.4 kg)  08/30/18 183 lb 8 oz (83.2 kg)  02/26/18 175 lb (79.4 kg)    Physical Exam Vitals signs and  nursing note reviewed.  Constitutional:      General: He is not in acute distress.    Appearance: Normal appearance. He is obese. He is not ill-appearing, toxic-appearing or diaphoretic.  HENT:     Head:  Normocephalic and atraumatic.     Right Ear: Tympanic membrane, ear canal and external ear normal. There is no impacted cerumen.     Left Ear: Tympanic membrane, ear canal and external ear normal. There is no impacted cerumen.     Nose: Nose normal. No congestion or rhinorrhea.     Mouth/Throat:     Mouth: Mucous membranes are moist.     Pharynx: Oropharynx is clear. No oropharyngeal exudate or posterior oropharyngeal erythema.  Eyes:     General: No scleral icterus.       Right eye: No discharge.        Left eye: No discharge.     Extraocular Movements: Extraocular movements intact.     Conjunctiva/sclera: Conjunctivae normal.     Pupils: Pupils are equal, round, and reactive to light.  Neck:     Musculoskeletal: Normal range of motion and neck supple. No neck rigidity or muscular tenderness.     Vascular: No carotid bruit.  Cardiovascular:     Rate and Rhythm: Normal rate and regular rhythm.     Pulses: Normal pulses.     Heart sounds: No murmur. No friction rub. No gallop.   Pulmonary:     Effort: Pulmonary effort is normal. No respiratory distress.     Breath sounds: Normal breath sounds. No stridor. No wheezing, rhonchi or rales.  Chest:     Chest wall: No tenderness.  Abdominal:     General: Abdomen is flat. Bowel sounds are normal. There is no distension.     Palpations: Abdomen is soft. There is no mass.     Tenderness: There is no abdominal tenderness. There is no right CVA tenderness, left CVA tenderness, guarding or rebound.     Hernia: No hernia is present.  Genitourinary:    Comments: Genital exam deferred with shared decision making Musculoskeletal:        General: No swelling, tenderness, deformity or signs of injury.     Right lower leg: No edema.     Left  lower leg: No edema.  Lymphadenopathy:     Cervical: No cervical adenopathy.  Skin:    General: Skin is warm and dry.     Capillary Refill: Capillary refill takes less than 2 seconds.     Coloration: Skin is not jaundiced or pale.     Findings: No bruising, erythema, lesion or rash.  Neurological:     General: No focal deficit present.     Mental Status: He is alert and oriented to person, place, and time.     Cranial Nerves: No cranial nerve deficit.     Sensory: No sensory deficit.     Motor: No weakness.     Coordination: Coordination normal.     Gait: Gait normal.     Deep Tendon Reflexes: Reflexes normal.  Psychiatric:        Mood and Affect: Mood normal.        Behavior: Behavior normal.        Thought Content: Thought content normal.        Judgment: Judgment normal.     Results for orders placed or performed in visit on 02/26/18  CBC with Differential/Platelet  Result Value Ref Range   WBC 6.6 3.4 - 10.8 x10E3/uL   RBC 4.39 4.14 - 5.80 x10E6/uL   Hemoglobin 14.0 13.0 - 17.7 g/dL   Hematocrit 11.9 14.7 - 51.0 %   MCV 92 79 - 97 fL   MCH 31.9 26.6 -  33.0 pg   MCHC 34.6 31.5 - 35.7 g/dL   RDW 16.112.4 09.612.3 - 04.515.4 %   Platelets 233 150 - 450 x10E3/uL   Neutrophils 64 Not Estab. %   Lymphs 22 Not Estab. %   Monocytes 9 Not Estab. %   Eos 3 Not Estab. %   Basos 1 Not Estab. %   Neutrophils Absolute 4.2 1.4 - 7.0 x10E3/uL   Lymphocytes Absolute 1.4 0.7 - 3.1 x10E3/uL   Monocytes Absolute 0.6 0.1 - 0.9 x10E3/uL   EOS (ABSOLUTE) 0.2 0.0 - 0.4 x10E3/uL   Basophils Absolute 0.1 0.0 - 0.2 x10E3/uL   Immature Granulocytes 1 Not Estab. %   Immature Grans (Abs) 0.0 0.0 - 0.1 x10E3/uL  Comprehensive metabolic panel  Result Value Ref Range   Glucose 90 65 - 99 mg/dL   BUN 7 (L) 8 - 27 mg/dL   Creatinine, Ser 4.090.80 0.76 - 1.27 mg/dL   GFR calc non Af Amer 80 >59 mL/min/1.73   GFR calc Af Amer 92 >59 mL/min/1.73   BUN/Creatinine Ratio 9 (L) 10 - 24   Sodium 139 134 - 144  mmol/L   Potassium 4.5 3.5 - 5.2 mmol/L   Chloride 101 96 - 106 mmol/L   CO2 24 20 - 29 mmol/L   Calcium 9.0 8.6 - 10.2 mg/dL   Total Protein 6.1 6.0 - 8.5 g/dL   Albumin 4.4 3.5 - 4.7 g/dL   Globulin, Total 1.7 1.5 - 4.5 g/dL   Albumin/Globulin Ratio 2.6 (H) 1.2 - 2.2   Bilirubin Total 0.7 0.0 - 1.2 mg/dL   Alkaline Phosphatase 59 39 - 117 IU/L   AST 31 0 - 40 IU/L   ALT 25 0 - 44 IU/L  Lipid Panel w/o Chol/HDL Ratio  Result Value Ref Range   Cholesterol, Total 112 100 - 199 mg/dL   Triglycerides 93 0 - 149 mg/dL   HDL 47 >81>39 mg/dL   VLDL Cholesterol Cal 19 5 - 40 mg/dL   LDL Calculated 46 0 - 99 mg/dL      Assessment & Plan:   Problem List Items Addressed This Visit      Cardiovascular and Mediastinum   Hypertension    Not under good control. Refuses BP medicine. Will continue aspirin and keep cholesterol under good control. He is aware of the risks.       Relevant Medications   aspirin EC 81 MG tablet   rosuvastatin (CRESTOR) 10 MG tablet   Other Relevant Orders   CBC with Differential/Platelet   Comprehensive metabolic panel   Microalbumin, Urine Waived   TSH   UA/M w/rflx Culture, Routine   CAD (coronary artery disease)    Will continue aspirin and keep cholesterol under good control. Continue to follow with vascular as needed. Call with any concerns.       Relevant Medications   aspirin EC 81 MG tablet   rosuvastatin (CRESTOR) 10 MG tablet   Other Relevant Orders   CBC with Differential/Platelet   Comprehensive metabolic panel   TSH   UA/M w/rflx Culture, Routine   Carotid atherosclerosis    Will continue aspirin and keep cholesterol under good control. Continue to follow with vascular as needed. Call with any concerns.       Relevant Medications   aspirin EC 81 MG tablet   rosuvastatin (CRESTOR) 10 MG tablet   Other Relevant Orders   CBC with Differential/Platelet   Comprehensive metabolic panel   TSH   UA/M w/rflx Culture,  Routine     Other    Macular degeneration    Continue to follow with ophthalmology. Call with any concerns.       Relevant Orders   CBC with Differential/Platelet   Comprehensive metabolic panel   TSH   UA/M w/rflx Culture, Routine   Hyperlipemia    Under good control on current regimen. Continue current regimen. Continue to monitor. Call with any concerns. Refills given. Labs drawn today.       Relevant Medications   aspirin EC 81 MG tablet   rosuvastatin (CRESTOR) 10 MG tablet   Other Relevant Orders   CBC with Differential/Platelet   Comprehensive metabolic panel   Lipid Panel w/o Chol/HDL Ratio   TSH   UA/M w/rflx Culture, Routine    Other Visit Diagnoses    Routine general medical examination at a health care facility    -  Primary   Refuses vaccines. Screening labs checked today. Colonoscopy N/A. Continue diet and exercise. Call with any concerns.        Discussed aspirin prophylaxis for myocardial infarction prevention and decision was made to continue ASA  LABORATORY TESTING:  Health maintenance labs ordered today as discussed above.    IMMUNIZATIONS:   - Tdap: Tetanus vaccination status reviewed: Refused. - Influenza: Up to date - Pneumovax: Refused - Prevnar: Refused  SCREENING: - Colonoscopy: Not applicable   PATIENT COUNSELING:    Sexuality: Discussed sexually transmitted diseases, partner selection, use of condoms, avoidance of unintended pregnancy  and contraceptive alternatives.   Advised to avoid cigarette smoking.  I discussed with the patient that most people either abstain from alcohol or drink within safe limits (<=14/week and <=4 drinks/occasion for males, <=7/weeks and <= 3 drinks/occasion for females) and that the risk for alcohol disorders and other health effects rises proportionally with the number of drinks per week and how often a drinker exceeds daily limits.  Discussed cessation/primary prevention of drug use and availability of treatment for abuse.    Diet: Encouraged to adjust caloric intake to maintain  or achieve ideal body weight, to reduce intake of dietary saturated fat and total fat, to limit sodium intake by avoiding high sodium foods and not adding table salt, and to maintain adequate dietary potassium and calcium preferably from fresh fruits, vegetables, and low-fat dairy products.    stressed the importance of regular exercise  Injury prevention: Discussed safety belts, safety helmets, smoke detector, smoking near bedding or upholstery.   Dental health: Discussed importance of regular tooth brushing, flossing, and dental visits.   Follow up plan: NEXT PREVENTATIVE PHYSICAL DUE IN 1 YEAR. Return in about 6 months (around 03/02/2019) for 6 month follow up.

## 2018-08-30 ENCOUNTER — Encounter: Payer: Self-pay | Admitting: Family Medicine

## 2018-08-30 ENCOUNTER — Ambulatory Visit (INDEPENDENT_AMBULATORY_CARE_PROVIDER_SITE_OTHER): Payer: Medicare Other | Admitting: Family Medicine

## 2018-08-30 ENCOUNTER — Ambulatory Visit: Payer: Self-pay

## 2018-08-30 ENCOUNTER — Ambulatory Visit (INDEPENDENT_AMBULATORY_CARE_PROVIDER_SITE_OTHER): Payer: Medicare Other

## 2018-08-30 VITALS — BP 173/77 | HR 56 | Temp 98.0°F | Ht 69.7 in | Wt 183.5 lb

## 2018-08-30 VITALS — BP 173/77 | HR 56 | Temp 98.0°F | Resp 16 | Ht 69.7 in | Wt 183.8 lb

## 2018-08-30 DIAGNOSIS — I10 Essential (primary) hypertension: Secondary | ICD-10-CM

## 2018-08-30 DIAGNOSIS — H3530 Unspecified macular degeneration: Secondary | ICD-10-CM

## 2018-08-30 DIAGNOSIS — I2510 Atherosclerotic heart disease of native coronary artery without angina pectoris: Secondary | ICD-10-CM | POA: Diagnosis not present

## 2018-08-30 DIAGNOSIS — I6529 Occlusion and stenosis of unspecified carotid artery: Secondary | ICD-10-CM | POA: Diagnosis not present

## 2018-08-30 DIAGNOSIS — Z0000 Encounter for general adult medical examination without abnormal findings: Secondary | ICD-10-CM

## 2018-08-30 DIAGNOSIS — E782 Mixed hyperlipidemia: Secondary | ICD-10-CM | POA: Diagnosis not present

## 2018-08-30 LAB — MICROALBUMIN, URINE WAIVED
Creatinine, Urine Waived: 50 mg/dL (ref 10–300)
Microalb, Ur Waived: 10 mg/L (ref 0–19)
Microalb/Creat Ratio: <30 mg/g (ref <30)

## 2018-08-30 LAB — UA/M W/RFLX CULTURE, ROUTINE
Bilirubin, UA: NEGATIVE
Glucose, UA: NEGATIVE
Ketones, UA: NEGATIVE
Leukocytes, UA: NEGATIVE
NITRITE UA: NEGATIVE
Protein, UA: NEGATIVE
RBC, UA: NEGATIVE
Specific Gravity, UA: 1.01 (ref 1.005–1.030)
Urobilinogen, Ur: 0.2 mg/dL (ref 0.2–1.0)
pH, UA: 7 (ref 5.0–7.5)

## 2018-08-30 MED ORDER — ROSUVASTATIN CALCIUM 10 MG PO TABS: 10.0000 mg | ORAL_TABLET | Freq: Every day | ORAL | 1 refills | Status: DC

## 2018-08-30 MED ORDER — ASPIRIN EC 81 MG PO TBEC: 81.0000 mg | DELAYED_RELEASE_TABLET | Freq: Every day | ORAL | 12 refills | Status: DC

## 2018-08-30 NOTE — Progress Notes (Signed)
Subjective:   Nicholas HewsOscar C Gross is a 85 y.o. male who presents for Medicare Annual/Subsequent preventive examination.  Review of Systems:    Cardiac Risk Factors include: advanced age (>5755men, 2>65 women);hypertension;male gender;dyslipidemia     Objective:    Vitals: BP (!) 173/77 (BP Location: Left Arm, Patient Position: Sitting, Cuff Size: Normal)   Pulse (!) 56   Temp 98 F (36.7 C) (Oral)   Resp 16   Ht 5' 9.7" (1.77 m)   Wt 183 lb 12.8 oz (83.4 kg)   SpO2 100%   BMI 26.60 kg/m   Body mass index is 26.6 kg/m.  Advanced Directives 08/30/2018 08/26/2017  Does Patient Have a Medical Advance Directive? No No  Would patient like information on creating a medical advance directive? No - Patient declined No - Patient declined    Tobacco Social History   Tobacco Use  Smoking Status Former Smoker  . Packs/day: 1.00  . Years: 17.00  . Pack years: 17.00  . Types: Cigarettes  . Last attempt to quit: 06/23/1968  . Years since quitting: 50.2  Smokeless Tobacco Never Used     Counseling given: Not Answered   Clinical Intake:  Pre-visit preparation completed: Yes  Pain : No/denies pain     Nutritional Status: BMI 25 -29 Overweight Nutritional Risks: None Diabetes: No  How often do you need to have someone help you when you read instructions, pamphlets, or other written materials from your doctor or pharmacy?: 1 - Never What is the last grade level you completed in school?: high school   Interpreter Needed?: No  Information entered by :: Nicholas Hill,LPN   Past Medical History:  Diagnosis Date  . CAD (coronary artery disease)   . History of GI bleed 2008  . History of tobacco use    17 pack years, quit around 1970  . Hyperlipidemia   . Hypertension   . Overweight    Past Surgical History:  Procedure Laterality Date  . CAROTID ENDARTERECTOMY Left 2010  . CATARACT EXTRACTION    . cypher stent  09/12/02   s/p cypher stent mid-LAD  . TENDON REPAIR  1991   finger and arm   Family History  Problem Relation Age of Onset  . Heart disease Father        possibly  . Cancer Sister        breast  . AAA (abdominal aortic aneurysm) Brother   . Cancer Sister        lung   Social History   Socioeconomic History  . Marital status: Widowed    Spouse name: Not on file  . Number of children: Not on file  . Years of education: some college in [place]   . Highest education level: High school graduate  Occupational History  . Occupation: retired   Engineer, productionocial Needs  . Financial resource strain: Not hard at all  . Food insecurity:    Worry: Never true    Inability: Never true  . Transportation needs:    Medical: No    Non-medical: No  Tobacco Use  . Smoking status: Former Smoker    Packs/day: 1.00    Years: 17.00    Pack years: 17.00    Types: Cigarettes    Last attempt to quit: 06/23/1968    Years since quitting: 50.2  . Smokeless tobacco: Never Used  Substance and Sexual Activity  . Alcohol use: No  . Drug use: No  . Sexual activity: Not on file  Lifestyle  . Physical activity:    Days per week: 0 days    Minutes per session: 0 min  . Stress: Not at all  Relationships  . Social connections:    Talks on phone: Once a week    Gets together: Once a week    Attends religious service: More than 4 times per year    Active member of club or organization: No    Attends meetings of clubs or organizations: Never    Relationship status: Widowed  Other Topics Concern  . Not on file  Social History Narrative   Attends church, neighbor take him     Outpatient Encounter Medications as of 08/30/2018  Medication Sig  . Multiple Vitamins-Minerals (PRESERVISION AREDS PO) Take by mouth 2 (two) times daily.  . Omega-3 Fatty Acids (FISH OIL PO) Take by mouth daily.  . [DISCONTINUED] aspirin EC 81 MG tablet Take 1 tablet (81 mg total) by mouth daily.  . [DISCONTINUED] rosuvastatin (CRESTOR) 10 MG tablet TAKE 1 TABLET BY MOUTH ONCE DAILY.   No  facility-administered encounter medications on file as of 08/30/2018.     Activities of Daily Living In your present state of health, do you have any difficulty performing the following activities: 08/30/2018 08/30/2018  Hearing? N N  Vision? Y Y  Comment macular degeneration  -  Difficulty concentrating or making decisions? Malvin Johns  Comment eventually comes back  -  Walking or climbing stairs? N N  Dressing or bathing? N N  Doing errands, shopping? N N  Preparing Food and eating ? N -  Using the Toilet? N -  In the past six months, have you accidently leaked urine? N -  Do you have problems with loss of bowel control? N -  Managing your Medications? N -  Managing your Finances? N -  Housekeeping or managing your Housekeeping? N -  Some recent data might be hidden    Patient Care Team: Dorcas Carrow, DO as PCP - General (Family Medicine) Wyn Quaker Marlow Baars, MD as Referring Physician (Vascular Surgery)   Assessment:   This is a routine wellness examination for Nicholas Gross.  Exercise Activities and Dietary recommendations Current Exercise Habits: Home exercise routine, Type of exercise: walking;strength training/weights(walk when weather permits,40  push ups daily ), Time (Minutes): 60, Frequency (Times/Week): 3, Weekly Exercise (Minutes/Week): 180, Intensity: Mild, Exercise limited by: None identified  Goals    . DIET - INCREASE WATER INTAKE     Recommend drinking at least 6-8 glasses of water a day       Fall Risk: Fall Risk  08/30/2018 08/26/2017 08/21/2016 08/15/2015  Falls in the past year? 0 No No No  Number falls in past yr: 0 - - -  Injury with Fall? 0 - - -  Follow up Falls evaluation completed - - -    FALL RISK PREVENTION PERTAINING TO THE HOME:  Any stairs in or around the home? Yes  If so, are there any without handrails? No   Home free of loose throw rugs in walkways, pet beds, electrical cords, etc? Yes  Adequate lighting in your home to reduce risk of falls? Yes   ASSISTIVE  DEVICES UTILIZED TO PREVENT FALLS:  Life alert? No  Use of a cane, walker or w/c? No  Grab bars in the bathroom? No  Shower chair or bench in shower? No  Elevated toilet seat or a handicapped toilet? No   DME ORDERS:  DME order needed?  No  TIMED UP AND GO:  Was the test performed? Yes .   Length of time to ambulate 10 feet: 11 sec.   GAIT:  Appearance of gait: Gait steady and fast without the use of an assistive device.   Education: Fall risk prevention has been discussed.  Intervention(s) required? No    Depression Screen PHQ 2/9 Scores 08/30/2018 08/26/2017 08/21/2016 08/15/2015  PHQ - 2 Score 0 0 0 0  PHQ- 9 Score 0 1 - -    Cognitive Function     6CIT Screen 08/30/2018 08/26/2017 08/21/2016  What Year? 0 points 0 points 0 points  What month? 0 points 0 points 0 points  What time? 0 points 0 points 0 points  Count back from 20 0 points 0 points 0 points  Months in reverse 0 points 0 points 0 points  Repeat phrase 2 points 4 points 2 points  Total Score Immunization History  Administered Date(s) Administered  . Influenza, High Dose Seasonal PF 02/26/2018, 04/16/2018  . Influenza-Unspecified 04/18/2015, 04/10/2016, 04/21/2017  . Pneumococcal Polysaccharide-23 06/23/1998    Qualifies for Shingles Vaccine? Yes  Zostavax completed n/a. Due for Shingrix. Education has been provided regarding the importance of this vaccine. Pt has been advised to call insurance company to determine out of pocket expense. Advised may also receive vaccine at local pharmacy or Health Dept. Verbalized acceptance and understanding.  Tdap: Although this vaccine is not a covered service during a Wellness Exam, does the patient still wish to receive this vaccine today?  No .  Education has been provided regarding the importance of this vaccine. Advised may receive this vaccine at local pharmacy or Health Dept. Aware to provide a copy of the vaccination record if obtained from local pharmacy or  Health Dept. Verbalized acceptance and understanding.  Flu Vaccine: Dup to date   Pneumococcal Vaccine: Due for Pneumococcal vaccine. Does the patient want to receive this vaccine today?  No . Education has been provided regarding the importance of this vaccine but still declined. Advised may receive this vaccine at local pharmacy or Health Dept. Aware to provide a copy of the vaccination record if obtained from local pharmacy or Health Dept. Verbalized acceptance and understanding.   Screening Tests Health Maintenance  Topic Date Due  . TETANUS/TDAP  08/30/2019 (Originally 07/10/1946)  . PNA vac Low Risk Adult (2 of 2 - PCV13) 08/30/2019 (Originally 06/24/1999)  . INFLUENZA VACCINE  Completed   Cancer Screenings:  Colorectal Screening: no longer required  Lung Cancer Screening: (Low Dose CT Chest recommended if Age 73-80 years, 30 pack-year currently smoking OR have quit w/in 15years.) does not qualify.     Additional Screening:  Hepatitis C Screening: does not qualify  Vision Screening: Recommended annual ophthalmology exams for early detection of glaucoma and other disorders of the eye. Is the patient up to date with their annual eye exam?  Yes  Who is the provider or what is the name of the office in which the pt attends annual eye exams? [facility]    Dental Screening: Recommended annual dental exams for proper oral hygiene  Community Resource Referral:  CRR required this visit?  No        Plan:  I have personally reviewed and addressed the Medicare Annual Wellness questionnaire and have noted the following in the patient's chart:  A. Medical and social history B. Use of alcohol, tobacco or illicit drugs  C. Current medications and supplements D. Functional  ability and status E.  Nutritional status F.  Physical activity G. Advance directives H. List of other physicians I.  Hospitalizations, surgeries, and ER visits in previous 12 months J.   Vitals K. Screenings such as hearing and vision if needed, cognitive and depression  L. Referrals and appointments   In addition, I have reviewed and discussed with patient certain preventive protocols, quality metrics, and best practice recommendations. A written personalized care plan for preventive services as well as general preventive health recommendations were provided to patient.   Signed,   Collene Schlichter, LPN  01/23/5052 Nurse Health Advisor   Nurse Notes: none

## 2018-08-30 NOTE — Assessment & Plan Note (Signed)
Will continue aspirin and keep cholesterol under good control. Continue to follow with vascular as needed. Call with any concerns.

## 2018-08-30 NOTE — Patient Instructions (Signed)
Mr. Paladino , Thank you for taking time to come for your Medicare Wellness Visit. I appreciate your ongoing commitment to your health goals. Please review the following plan we discussed and let me know if I can assist you in the future.   Screening recommendations/referrals: Colonoscopy: no longer required Recommended yearly ophthalmology/optometry visit for glaucoma screening and checkup Recommended yearly dental visit for hygiene and checkup  Vaccinations: Influenza vaccine: up to date  Pneumococcal vaccine: declined Tdap vaccine: declined Shingles vaccine: shingrix eligible, check with your insurance company for coverage   Advanced directives: Advance directive discussed with you today. Even though you declined this today please call our office should you change your mind and we can give you the proper paperwork for you to fill out.  Conditions/risks identified: keep an eye on your blood pressure at home.   Next appointment: Follow up in one year for your annual wellness exam.   Preventive Care 65 Years and Older, Male Preventive care refers to lifestyle choices and visits with your health care provider that can promote health and wellness. What does preventive care include?  A yearly physical exam. This is also called an annual well check.  Dental exams once or twice a year.  Routine eye exams. Ask your health care provider how often you should have your eyes checked.  Personal lifestyle choices, including:  Daily care of your teeth and gums.  Regular physical activity.  Eating a healthy diet.  Avoiding tobacco and drug use.  Limiting alcohol use.  Practicing safe sex.  Taking low doses of aspirin every day.  Taking vitamin and mineral supplements as recommended by your health care provider. What happens during an annual well check? The services and screenings done by your health care provider during your annual well check will depend on your age, overall  health, lifestyle risk factors, and family history of disease. Counseling  Your health care provider may ask you questions about your:  Alcohol use.  Tobacco use.  Drug use.  Emotional well-being.  Home and relationship well-being.  Sexual activity.  Eating habits.  History of falls.  Memory and ability to understand (cognition).  Work and work Astronomer. Screening  You may have the following tests or measurements:  Height, weight, and BMI.  Blood pressure.  Lipid and cholesterol levels. These may be checked every 5 years, or more frequently if you are over 9 years old.  Skin check.  Lung cancer screening. You may have this screening every year starting at age 42 if you have a 30-pack-year history of smoking and currently smoke or have quit within the past 15 years.  Fecal occult blood test (FOBT) of the stool. You may have this test every year starting at age 10.  Flexible sigmoidoscopy or colonoscopy. You may have a sigmoidoscopy every 5 years or a colonoscopy every 10 years starting at age 23.  Prostate cancer screening. Recommendations will vary depending on your family history and other risks.  Hepatitis C blood test.  Hepatitis B blood test.  Sexually transmitted disease (STD) testing.  Diabetes screening. This is done by checking your blood sugar (glucose) after you have not eaten for a while (fasting). You may have this done every 1-3 years.  Abdominal aortic aneurysm (AAA) screening. You may need this if you are a current or former smoker.  Osteoporosis. You may be screened starting at age 52 if you are at high risk. Talk with your health care provider about your test results, treatment options,  and if necessary, the need for more tests. Vaccines  Your health care provider may recommend certain vaccines, such as:  Influenza vaccine. This is recommended every year.  Tetanus, diphtheria, and acellular pertussis (Tdap, Td) vaccine. You may need a Td  booster every 10 years.  Zoster vaccine. You may need this after age 66.  Pneumococcal 13-valent conjugate (PCV13) vaccine. One dose is recommended after age 55.  Pneumococcal polysaccharide (PPSV23) vaccine. One dose is recommended after age 13. Talk to your health care provider about which screenings and vaccines you need and how often you need them. This information is not intended to replace advice given to you by your health care provider. Make sure you discuss any questions you have with your health care provider. Document Released: 07/06/2015 Document Revised: 02/27/2016 Document Reviewed: 04/10/2015 Elsevier Interactive Patient Education  2017 [place] Prevention in the Home Falls can cause injuries. They can happen to people of all ages. There are many things you can do to make your home safe and to help prevent falls. What can I do on the outside of my home?  Regularly fix the edges of walkways and driveways and fix any cracks.  Remove anything that might make you trip as you walk through a door, such as a raised step or threshold.  Trim any bushes or trees on the path to your home.  Use bright outdoor lighting.  Clear any walking paths of anything that might make someone trip, such as rocks or tools.  Regularly check to see if handrails are loose or broken. Make sure that both sides of any steps have handrails.  Any raised decks and porches should have guardrails on the edges.  Have any leaves, snow, or ice cleared regularly.  Use sand or salt on walking paths during winter.  Clean up any spills in your garage right away. This includes oil or grease spills. What can I do in the bathroom?  Use night lights.  Install grab bars by the toilet and in the tub and shower. Do not use towel bars as grab bars.  Use non-skid mats or decals in the tub or shower.  If you need to sit down in the shower, use a plastic, non-slip stool.  Keep the floor dry. Clean up  any water that spills on the floor as soon as it happens.  Remove soap buildup in the tub or shower regularly.  Attach bath mats securely with double-sided non-slip rug tape.  Do not have throw rugs and other things on the floor that can make you trip. What can I do in the bedroom?  Use night lights.  Make sure that you have a light by your bed that is easy to reach.  Do not use any sheets or blankets that are too big for your bed. They should not hang down onto the floor.  Have a firm chair that has side arms. You can use this for support while you get dressed.  Do not have throw rugs and other things on the floor that can make you trip. What can I do in the kitchen?  Clean up any spills right away.  Avoid walking on wet floors.  Keep items that you use a lot in easy-to-reach places.  If you need to reach something above you, use a strong step stool that has a grab bar.  Keep electrical cords out of the way.  Do not use floor polish or wax that makes floors slippery. If  you must use wax, use non-skid floor wax.  Do not have throw rugs and other things on the floor that can make you trip. What can I do with my stairs?  Do not leave any items on the stairs.  Make sure that there are handrails on both sides of the stairs and use them. Fix handrails that are broken or loose. Make sure that handrails are as long as the stairways.  Check any carpeting to make sure that it is firmly attached to the stairs. Fix any carpet that is loose or worn.  Avoid having throw rugs at the top or bottom of the stairs. If you do have throw rugs, attach them to the floor with carpet tape.  Make sure that you have a light switch at the top of the stairs and the bottom of the stairs. If you do not have them, ask someone to add them for you. What else can I do to help prevent falls?  Wear shoes that:  Do not have high heels.  Have rubber bottoms.  Are comfortable and fit you well.  Are  closed at the toe. Do not wear sandals.  If you use a stepladder:  Make sure that it is fully opened. Do not climb a closed stepladder.  Make sure that both sides of the stepladder are locked into place.  Ask someone to hold it for you, if possible.  Clearly mark and make sure that you can see:  Any grab bars or handrails.  First and last steps.  Where the edge of each step is.  Use tools that help you move around (mobility aids) if they are needed. These include:  Canes.  Walkers.  Scooters.  Crutches.  Turn on the lights when you go into a dark area. Replace any light bulbs as soon as they burn out.  Set up your furniture so you have a clear path. Avoid moving your furniture around.  If any of your floors are uneven, fix them.  If there are any pets around you, be aware of where they are.  Review your medicines with your doctor. Some medicines can make you feel dizzy. This can increase your chance of falling. Ask your doctor what other things that you can do to help prevent falls. This information is not intended to replace advice given to you by your health care provider. Make sure you discuss any questions you have with your health care provider. Document Released: 04/05/2009 Document Revised: 11/15/2015 Document Reviewed: 07/14/2014 Elsevier Interactive Patient Education  2017 [employer].

## 2018-08-30 NOTE — Assessment & Plan Note (Signed)
Will continue aspirin and keep cholesterol under good control. Continue to follow with vascular as needed. Call with any concerns.  

## 2018-08-30 NOTE — Patient Instructions (Signed)
Health Maintenance After Age 83 After age 83, you are at a higher risk for certain long-term diseases and infections as well as injuries from falls. Falls are a major cause of broken bones and head injuries in people who are older than age 83. Getting regular preventive care can help to keep you healthy and well. Preventive care includes getting regular testing and making lifestyle changes as recommended by your health care provider. Talk with your health care provider about:  Which screenings and tests you should have. A screening is a test that checks for a disease when you have no symptoms.  A diet and exercise plan that is right for you. What should I know about screenings and tests to prevent falls? Screening and testing are the best ways to find a health problem early. Early diagnosis and treatment give you the best chance of managing medical conditions that are common after age 83. Certain conditions and lifestyle choices may make you more likely to have a fall. Your health care provider may recommend:  Regular vision checks. Poor vision and conditions such as cataracts can make you more likely to have a fall. If you wear glasses, make sure to get your prescription updated if your vision changes.  Medicine review. Work with your health care provider to regularly review all of the medicines you are taking, including over-the-counter medicines. Ask your health care provider about any side effects that may make you more likely to have a fall. Tell your health care provider if any medicines that you take make you feel dizzy or sleepy.  Osteoporosis screening. Osteoporosis is a condition that causes the bones to get weaker. This can make the bones weak and cause them to break more easily.  Blood pressure screening. Blood pressure changes and medicines to control blood pressure can make you feel dizzy.  Strength and balance checks. Your health care provider may recommend certain tests to check your  strength and balance while standing, walking, or changing positions.  Foot health exam. Foot pain and numbness, as well as not wearing proper footwear, can make you more likely to have a fall.  Depression screening. You may be more likely to have a fall if you have a fear of falling, feel emotionally low, or feel unable to do activities that you used to do.  Alcohol use screening. Using too much alcohol can affect your balance and may make you more likely to have a fall. What actions can I take to lower my risk of falls? General instructions  Talk with your health care provider about your risks for falling. Tell your health care provider if: ? You fall. Be sure to tell your health care provider about all falls, even ones that seem minor. ? You feel dizzy, sleepy, or off-balance.  Take over-the-counter and prescription medicines only as told by your health care provider. These include any supplements.  Eat a healthy diet and maintain a healthy weight. A healthy diet includes low-fat dairy products, low-fat (lean) meats, and fiber from whole grains, beans, and lots of fruits and vegetables. Home safety  Remove any tripping hazards, such as rugs, cords, and clutter.  Install safety equipment such as grab bars in bathrooms and safety rails on stairs.  Keep rooms and walkways well-lit. Activity   Follow a regular exercise program to stay fit. This will help you maintain your balance. Ask your health care provider what types of exercise are appropriate for you.  If you need a cane or   walker, use it as recommended by your health care provider.  Wear supportive shoes that have nonskid soles. Lifestyle  Do not drink alcohol if your health care provider tells you not to drink.  If you drink alcohol, limit how much you have: ? 0-1 drink a day for women. ? 0-2 drinks a day for men.  Be aware of how much alcohol is in your drink. In the [place] one drink equals one typical bottle of beer (12  oz), one-half glass of wine (5 oz), or one shot of hard liquor (1 oz).  Do not use any products that contain nicotine or tobacco, such as cigarettes and e-cigarettes. If you need help quitting, ask your health care provider. Summary  Having a healthy lifestyle and getting preventive care can help to protect your health and wellness after age 83.  Screening and testing are the best way to find a health problem early and help you avoid having a fall. Early diagnosis and treatment give you the best chance for managing medical conditions that are more common for people who are older than age 83.  Falls are a major cause of broken bones and head injuries in people who are older than age 83. Take precautions to prevent a fall at home.  Work with your health care provider to learn what changes you can make to improve your health and wellness and to prevent falls. This information is not intended to replace advice given to you by your health care provider. Make sure you discuss any questions you have with your health care provider. Document Released: 04/22/2017 Document Revised: 04/22/2017 Document Reviewed: 04/22/2017 Elsevier Interactive Patient Education  2019 [employer]  

## 2018-08-30 NOTE — Assessment & Plan Note (Signed)
Under good control on current regimen. Continue current regimen. Continue to monitor. Call with any concerns. Refills given. Labs drawn today.   

## 2018-08-30 NOTE — Assessment & Plan Note (Signed)
Continue to follow with ophthalmology. Call with any concerns.  

## 2018-08-30 NOTE — Assessment & Plan Note (Signed)
Not under good control. Refuses BP medicine. Will continue aspirin and keep cholesterol under good control. He is aware of the risks.

## 2018-08-31 ENCOUNTER — Encounter: Payer: Self-pay | Admitting: Family Medicine

## 2018-08-31 LAB — COMPREHENSIVE METABOLIC PANEL
ALT: 19 IU/L (ref 0–44)
AST: 26 IU/L (ref 0–40)
Albumin/Globulin Ratio: 2 (ref 1.2–2.2)
Albumin: 4.2 g/dL (ref 3.6–4.6)
Alkaline Phosphatase: 72 IU/L (ref 39–117)
BUN/Creatinine Ratio: 14 (ref 10–24)
BUN: 11 mg/dL (ref 8–27)
Bilirubin Total: 0.5 mg/dL (ref 0.0–1.2)
CHLORIDE: 101 mmol/L (ref 96–106)
CO2: 24 mmol/L (ref 20–29)
Calcium: 9 mg/dL (ref 8.6–10.2)
Creatinine, Ser: 0.81 mg/dL (ref 0.76–1.27)
GFR calc Af Amer: 91 mL/min/{1.73_m2} (ref >59)
GFR calc non Af Amer: 79 mL/min/{1.73_m2} (ref >59)
GLOBULIN, TOTAL: 2.1 g/dL (ref 1.5–4.5)
Glucose: 101 mg/dL — ABNORMAL HIGH (ref 65–99)
Potassium: 4.3 mmol/L (ref 3.5–5.2)
SODIUM: 139 mmol/L (ref 134–144)
Total Protein: 6.3 g/dL (ref 6.0–8.5)

## 2018-08-31 LAB — CBC WITH DIFFERENTIAL/PLATELET
BASOS ABS: 0.1 10*3/uL (ref 0.0–0.2)
Basos: 1 %
EOS (ABSOLUTE): 0.2 10*3/uL (ref 0.0–0.4)
Eos: 3 %
HEMOGLOBIN: 14.2 g/dL (ref 13.0–17.7)
Hematocrit: 41.7 % (ref 37.5–51.0)
IMMATURE GRANULOCYTES: 1 %
Immature Grans (Abs): 0 10*3/uL (ref 0.0–0.1)
LYMPHS ABS: 1.4 10*3/uL (ref 0.7–3.1)
Lymphs: 23 %
MCH: 31.7 pg (ref 26.6–33.0)
MCHC: 34.1 g/dL (ref 31.5–35.7)
MCV: 93 fL (ref 79–97)
MONOCYTES: 10 %
Monocytes Absolute: 0.6 10*3/uL (ref 0.1–0.9)
NEUTROS PCT: 62 %
Neutrophils Absolute: 3.8 10*3/uL (ref 1.4–7.0)
Platelets: 227 10*3/uL (ref 150–450)
RBC: 4.48 x10E6/uL (ref 4.14–5.80)
RDW: 12.3 % (ref 11.6–15.4)
WBC: 6.2 10*3/uL (ref 3.4–10.8)

## 2018-08-31 LAB — TSH: TSH: 2.82 u[IU]/mL (ref 0.450–4.500)

## 2018-08-31 LAB — LIPID PANEL W/O CHOL/HDL RATIO
Cholesterol, Total: 141 mg/dL (ref 100–199)
HDL: 45 mg/dL (ref >39)
LDL Calculated: 64 mg/dL (ref 0–99)
Triglycerides: 162 mg/dL — ABNORMAL HIGH (ref 0–149)
VLDL Cholesterol Cal: 32 mg/dL (ref 5–40)

## 2019-03-02 ENCOUNTER — Encounter: Payer: Self-pay | Admitting: Family Medicine

## 2019-03-02 ENCOUNTER — Other Ambulatory Visit: Payer: Self-pay

## 2019-03-02 ENCOUNTER — Ambulatory Visit (INDEPENDENT_AMBULATORY_CARE_PROVIDER_SITE_OTHER): Payer: Medicare Other | Admitting: Family Medicine

## 2019-03-02 VITALS — BP 125/62 | HR 69

## 2019-03-02 DIAGNOSIS — L089 Local infection of the skin and subcutaneous tissue, unspecified: Secondary | ICD-10-CM

## 2019-03-02 DIAGNOSIS — E782 Mixed hyperlipidemia: Secondary | ICD-10-CM

## 2019-03-02 DIAGNOSIS — I10 Essential (primary) hypertension: Secondary | ICD-10-CM | POA: Diagnosis not present

## 2019-03-02 DIAGNOSIS — S80812A Abrasion, left lower leg, initial encounter: Secondary | ICD-10-CM

## 2019-03-02 MED ORDER — ROSUVASTATIN CALCIUM 10 MG PO TABS: 10.0000 mg | ORAL_TABLET | Freq: Every day | ORAL | 1 refills | Status: DC

## 2019-03-02 NOTE — Assessment & Plan Note (Signed)
Under good control off medicine. Continue to monitor. Call with any concerns.  

## 2019-03-02 NOTE — Progress Notes (Signed)
BP (!) 179/75   Pulse 61   Temp 98.4 F (36.9 C)   SpO2 99%    Subjective:    Patient ID: Nicholas Gross, male    DOB: 1928/01/25, 85 y.o.   MRN: [id number]  HPI: Nicholas Gross is a 85 y.o. male  Chief Complaint  Patient presents with  . Edema   SKIN INFECTION Duration: couple of weeks Location: R foot and leg History of trauma in area: no Pain: yes Quality: aching Severity: moderateH3 Redness: yes Swelling: yes Oozing: no Pus: no Fevers: no Nausea/vomiting: no Status: worse Treatments attempted:none  Tetanus: Refused  Relevant past medical, surgical, family and social history reviewed and updated as indicated. Interim medical history since our last visit reviewed. Allergies and medications reviewed and updated.  Review of Systems  Constitutional: Negative.   HENT: Negative.   Respiratory: Negative.   Cardiovascular: Positive for leg swelling. Negative for chest pain and palpitations.  Gastrointestinal: Negative.   Skin: Positive for color change. Negative for pallor, rash and wound.  Psychiatric/Behavioral: Negative.     Per HPI unless specifically indicated above     Objective:    BP (!) 179/75   Pulse 61   Temp 98.4 F (36.9 C)   SpO2 99%   Wt Readings from Last 3 Encounters:  08/30/18 183 lb 12.8 oz (83.4 kg)  08/30/18 183 lb 8 oz (83.2 kg)  02/26/18 175 lb (79.4 kg)    Physical Exam Vitals signs and nursing note reviewed.  Constitutional:      General: He is not in acute distress.    Appearance: Normal appearance. He is not ill-appearing, toxic-appearing or diaphoretic.  HENT:     Head: Normocephalic and atraumatic.     Right Ear: External ear normal.     Left Ear: External ear normal.     Nose: Nose normal.     Mouth/Throat:     Mouth: Mucous membranes are moist.     Pharynx: Oropharynx is clear.  Eyes:     General: No scleral icterus.       Right eye: No discharge.        Left eye: No discharge.     Extraocular Movements:  Extraocular movements intact.     Conjunctiva/sclera: Conjunctivae normal.     Pupils: Pupils are equal, round, and reactive to light.  Neck:     Musculoskeletal: Normal range of motion and neck supple.  Cardiovascular:     Rate and Rhythm: Normal rate and regular rhythm.     Pulses: Normal pulses.     Heart sounds: Normal heart sounds. No murmur. No friction rub. No gallop.   Pulmonary:     Effort: Pulmonary effort is normal. No respiratory distress.     Breath sounds: Normal breath sounds. No stridor. No wheezing, rhonchi or rales.  Chest:     Chest wall: No tenderness.  Musculoskeletal: Normal range of motion.     Right lower leg: Edema (3+ edema R leg) present.  Skin:    General: Skin is warm and dry.     Capillary Refill: Capillary refill takes less than 2 seconds.     Coloration: Skin is not jaundiced or pale.     Findings: Erythema (redness to the ankle on his foot with heat) present. No bruising, lesion or rash.  Neurological:     General: No focal deficit present.     Mental Status: He is alert and oriented to person, place, and time. Mental status  is at baseline.  Psychiatric:        Mood and Affect: Mood normal.        Behavior: Behavior normal.        Thought Content: Thought content normal.        Judgment: Judgment normal.     Results for orders placed or performed in visit on 08/30/18  CBC with Differential/Platelet  Result Value Ref Range   WBC 6.2 3.4 - 10.8 x10E3/uL   RBC 4.48 4.14 - 5.80 x10E6/uL   Hemoglobin 14.2 13.0 - 17.7 g/dL   Hematocrit 41.7 37.5 - 51.0 %   MCV 93 79 - 97 fL   MCH 31.7 26.6 - 33.0 pg   MCHC 34.1 31.5 - 35.7 g/dL   RDW 12.3 11.6 - 15.4 %   Platelets 227 150 - 450 x10E3/uL   Neutrophils 62 Not Estab. %   Lymphs 23 Not Estab. %   Monocytes 10 Not Estab. %   Eos 3 Not Estab. %   Basos 1 Not Estab. %   Neutrophils Absolute 3.8 1.4 - 7.0 x10E3/uL   Lymphocytes Absolute 1.4 0.7 - 3.1 x10E3/uL   Monocytes Absolute 0.6 0.1 - 0.9  x10E3/uL   EOS (ABSOLUTE) 0.2 0.0 - 0.4 x10E3/uL   Basophils Absolute 0.1 0.0 - 0.2 x10E3/uL   Immature Granulocytes 1 Not Estab. %   Immature Grans (Abs) 0.0 0.0 - 0.1 x10E3/uL  Comprehensive metabolic panel  Result Value Ref Range   Glucose 101 (H) 65 - 99 mg/dL   BUN 11 8 - 27 mg/dL   Creatinine, Ser 0.81 0.76 - 1.27 mg/dL   GFR calc non Af Amer 79 >59 mL/min/1.73   GFR calc Af Amer 91 >59 mL/min/1.73   BUN/Creatinine Ratio 14 10 - 24   Sodium 139 134 - 144 mmol/L   Potassium 4.3 3.5 - 5.2 mmol/L   Chloride 101 96 - 106 mmol/L   CO2 24 20 - 29 mmol/L   Calcium 9.0 8.6 - 10.2 mg/dL   Total Protein 6.3 6.0 - 8.5 g/dL   Albumin 4.2 3.6 - 4.6 g/dL   Globulin, Total 2.1 1.5 - 4.5 g/dL   Albumin/Globulin Ratio 2.0 1.2 - 2.2   Bilirubin Total 0.5 0.0 - 1.2 mg/dL   Alkaline Phosphatase 72 39 - 117 IU/L   AST 26 0 - 40 IU/L   ALT 19 0 - 44 IU/L  Lipid Panel w/o Chol/HDL Ratio  Result Value Ref Range   Cholesterol, Total 141 100 - 199 mg/dL   Triglycerides 162 (H) 0 - 149 mg/dL   HDL 45 >39 mg/dL   VLDL Cholesterol Cal 32 5 - 40 mg/dL   LDL Calculated 64 0 - 99 mg/dL  Microalbumin, Urine Waived  Result Value Ref Range   Microalb, Ur Waived 10 0 - 19 mg/L   Creatinine, Urine Waived 50 10 - 300 mg/dL   Microalb/Creat Ratio <30 <30 mg/g  TSH  Result Value Ref Range   TSH 2.820 0.450 - 4.500 uIU/mL  UA/M w/rflx Culture, Routine   Specimen: Urine   URINE  Result Value Ref Range   Specific Gravity, UA 1.010 1.005 - 1.030   pH, UA 7.0 5.0 - 7.5   Color, UA Yellow Yellow   Appearance Ur Clear Clear   Leukocytes, UA Negative Negative   Protein, UA Negative Negative/Trace   Glucose, UA Negative Negative   Ketones, UA Negative Negative   RBC, UA Negative Negative   Bilirubin, UA Negative Negative  Urobilinogen, Ur 0.2 0.2 - 1.0 mg/dL   Nitrite, UA Negative Negative      Assessment & Plan:   Problem List Items Addressed This Visit      Cardiovascular and Mediastinum    Hypertension    Due for labs today. Await results.       Relevant Medications   furosemide (LASIX) 20 MG tablet     Other   Hyperlipemia    Due for labs today. Await results.       Relevant Medications   furosemide (LASIX) 20 MG tablet    Other Visit Diagnoses    Cellulitis of right foot    -  Primary   Will treat with bactrim. Recheck with a week. Call with any concerns.    Peripheral edema       Refuses ultrasound. Will start lasix and recheck in 1 week. Call with any concerns.        Follow up plan: Return within a week.

## 2019-03-02 NOTE — Progress Notes (Signed)
BP 125/62   Pulse 69    Subjective:    Patient ID: Nicholas Gross, male    DOB: [birth year], 85 y.o.   MRN: [id number]  HPI: Nicholas Gross is a 85 y.o. male  Chief Complaint  Patient presents with  . Hyperlipidemia   HYPERTENSION / HYPERLIPIDEMIA Satisfied with current treatment? yes Duration of hypertension: chronic BP monitoring frequency: occasionally BP medication side effects: not on any Duration of hyperlipidemia: chronic Cholesterol medication side effects: no Cholesterol supplements: none Past cholesterol medications: crestor Medication compliance: excellent compliance Aspirin: yes Recent stressors: no Recurrent headaches: no Visual changes: no Palpitations: no Dyspnea: no Chest pain: no Lower extremity edema: no Dizzy/lightheaded: no   Relevant past medical, surgical, family and social history reviewed and updated as indicated. Interim medical history since our last visit reviewed. Allergies and medications reviewed and updated.  Review of Systems  Constitutional: Negative.   Respiratory: Negative.   Cardiovascular: Negative.   Skin: Positive for rash and wound. Negative for color change and pallor.  Neurological: Negative.   Psychiatric/Behavioral: Negative.     Per HPI unless specifically indicated above     Objective:    BP 125/62   Pulse 69   Wt Readings from Last 3 Encounters:  08/30/18 183 lb 12.8 oz (83.4 kg)  08/30/18 183 lb 8 oz (83.2 kg)  02/26/18 175 lb (79.4 kg)    Physical Exam Vitals signs and nursing note reviewed.  Constitutional:      General: He is not in acute distress.    Appearance: Normal appearance. He is not ill-appearing, toxic-appearing or diaphoretic.  HENT:     Head: Normocephalic and atraumatic.     Right Ear: External ear normal.     Left Ear: External ear normal.     Nose: Nose normal.     Mouth/Throat:     Mouth: Mucous membranes are moist.     Pharynx: Oropharynx is clear.  Eyes:     General: No  scleral icterus.       Right eye: No discharge.        Left eye: No discharge.     Conjunctiva/sclera: Conjunctivae normal.     Pupils: Pupils are equal, round, and reactive to light.  Neck:     Musculoskeletal: Normal range of motion.  Pulmonary:     Effort: Pulmonary effort is normal. No respiratory distress.     Comments: Speaking in full sentences Musculoskeletal: Normal range of motion.  Skin:    Coloration: Skin is not jaundiced or pale.     Findings: No bruising, erythema, lesion or rash.  Neurological:     Mental Status: He is alert and oriented to person, place, and time. Mental status is at baseline.  Psychiatric:        Mood and Affect: Mood normal.        Behavior: Behavior normal.        Thought Content: Thought content normal.        Judgment: Judgment normal.     Results for orders placed or performed in visit on 08/30/18  CBC with Differential/Platelet  Result Value Ref Range   WBC 6.2 3.4 - 10.8 x10E3/uL   RBC 4.48 4.14 - 5.80 x10E6/uL   Hemoglobin 14.2 13.0 - 17.7 g/dL   Hematocrit 41.7 37.5 - 51.0 %   MCV 93 79 - 97 fL   MCH 31.7 26.6 - 33.0 pg   MCHC 34.1 31.5 - 35.7 g/dL   RDW 12.3  11.6 - 15.4 %   Platelets 227 150 - 450 x10E3/uL   Neutrophils 62 Not Estab. %   Lymphs 23 Not Estab. %   Monocytes 10 Not Estab. %   Eos 3 Not Estab. %   Basos 1 Not Estab. %   Neutrophils Absolute 3.8 1.4 - 7.0 x10E3/uL   Lymphocytes Absolute 1.4 0.7 - 3.1 x10E3/uL   Monocytes Absolute 0.6 0.1 - 0.9 x10E3/uL   EOS (ABSOLUTE) 0.2 0.0 - 0.4 x10E3/uL   Basophils Absolute 0.1 0.0 - 0.2 x10E3/uL   Immature Granulocytes 1 Not Estab. %   Immature Grans (Abs) 0.0 0.0 - 0.1 x10E3/uL  Comprehensive metabolic panel  Result Value Ref Range   Glucose 101 (H) 65 - 99 mg/dL   BUN 11 8 - 27 mg/dL   Creatinine, Ser 3.82 0.76 - 1.27 mg/dL   GFR calc non Af Amer 79 >59 mL/min/1.73   GFR calc Af Amer 91 >59 mL/min/1.73   BUN/Creatinine Ratio 14 10 - 24   Sodium 139 134 - 144  mmol/L   Potassium 4.3 3.5 - 5.2 mmol/L   Chloride 101 96 - 106 mmol/L   CO2 24 20 - 29 mmol/L   Calcium 9.0 8.6 - 10.2 mg/dL   Total Protein 6.3 6.0 - 8.5 g/dL   Albumin 4.2 3.6 - 4.6 g/dL   Globulin, Total 2.1 1.5 - 4.5 g/dL   Albumin/Globulin Ratio 2.0 1.2 - 2.2   Bilirubin Total 0.5 0.0 - 1.2 mg/dL   Alkaline Phosphatase 72 39 - 117 IU/L   AST 26 0 - 40 IU/L   ALT 19 0 - 44 IU/L  Lipid Panel w/o Chol/HDL Ratio  Result Value Ref Range   Cholesterol, Total 141 100 - 199 mg/dL   Triglycerides 505 (H) 0 - 149 mg/dL   HDL 45 >39 mg/dL   VLDL Cholesterol Cal 32 5 - 40 mg/dL   LDL Calculated 64 0 - 99 mg/dL  Microalbumin, Urine Waived  Result Value Ref Range   Microalb, Ur Waived 10 0 - 19 mg/L   Creatinine, Urine Waived 50 10 - 300 mg/dL   Microalb/Creat Ratio <30 <30 mg/g  TSH  Result Value Ref Range   TSH 2.820 0.450 - 4.500 uIU/mL  UA/M w/rflx Culture, Routine   Specimen: Urine   URINE  Result Value Ref Range   Specific Gravity, UA 1.010 1.005 - 1.030   pH, UA 7.0 5.0 - 7.5   Color, UA Yellow Yellow   Appearance Ur Clear Clear   Leukocytes, UA Negative Negative   Protein, UA Negative Negative/Trace   Glucose, UA Negative Negative   Ketones, UA Negative Negative   RBC, UA Negative Negative   Bilirubin, UA Negative Negative   Urobilinogen, Ur 0.2 0.2 - 1.0 mg/dL   Nitrite, UA Negative Negative      Assessment & Plan:   Problem List Items Addressed This Visit      Cardiovascular and Mediastinum   Hypertension - Primary    Under good control off medicine. Continue to monitor. Call with any concerns.       Relevant Medications   rosuvastatin (CRESTOR) 10 MG tablet   Other Relevant Orders   Comprehensive metabolic panel     Other   Hyperlipemia    Under good control on current regimen. Continue current regimen. Continue to monitor. Call with any concerns. Refills given. Labs to be drawn today.       Relevant Medications   rosuvastatin (CRESTOR) 10 MG  tablet   Other Relevant Orders   Comprehensive metabolic panel   Lipid Panel w/o Chol/HDL Ratio    Other Visit Diagnoses    Abrasion of left lower leg with infection, initial encounter       States his leg is broken out and weeping- will get him in tomorrow for in person evaluation.       Follow up plan: Return Tomorrow- see leg.    . This visit was completed via telephone due to the restrictions of the COVID-19 pandemic. All issues as above were discussed and addressed but no physical exam was performed. If it was felt that the patient should be evaluated in the office, they were directed there. The patient verbally consented to this visit. Patient was unable to complete an audio/visual visit due to Lack of equipment. Due to the catastrophic nature of the COVID-19 pandemic, this visit was done through audio contact only. . Location of the patient: home . Location of the provider: home . Those involved with this call:  . Provider: Olevia PerchesMegan Johnson, DO . CMA: Tiffany Reel, CMA . Front Desk/Registration: Adela Portshristan Williamson  . Time spent on call: 25 minutes with patient face to face via video conference. More than 50% of this time was spent in counseling and coordination of care. 40 minutes total spent in review of patient's record and preparation of their chart.

## 2019-03-02 NOTE — Assessment & Plan Note (Signed)
Under good control on current regimen. Continue current regimen. Continue to monitor. Call with any concerns. Refills given. Labs to be drawn today.  

## 2019-03-03 ENCOUNTER — Encounter: Payer: Self-pay | Admitting: Family Medicine

## 2019-03-03 ENCOUNTER — Ambulatory Visit (INDEPENDENT_AMBULATORY_CARE_PROVIDER_SITE_OTHER): Payer: Medicare Other | Admitting: Family Medicine

## 2019-03-03 VITALS — BP 179/75 | HR 61 | Temp 98.4°F

## 2019-03-03 DIAGNOSIS — L03115 Cellulitis of right lower limb: Secondary | ICD-10-CM | POA: Diagnosis not present

## 2019-03-03 DIAGNOSIS — I10 Essential (primary) hypertension: Secondary | ICD-10-CM | POA: Diagnosis not present

## 2019-03-03 DIAGNOSIS — R609 Edema, unspecified: Secondary | ICD-10-CM | POA: Diagnosis not present

## 2019-03-03 DIAGNOSIS — E782 Mixed hyperlipidemia: Secondary | ICD-10-CM | POA: Diagnosis not present

## 2019-03-03 MED ORDER — SULFAMETHOXAZOLE-TRIMETHOPRIM 800-160 MG PO TABS: 1.0000 | ORAL_TABLET | Freq: Two times a day (BID) | ORAL | 0 refills | Status: DC

## 2019-03-03 MED ORDER — FUROSEMIDE 20 MG PO TABS: 20.0000 mg | ORAL_TABLET | Freq: Every day | ORAL | 3 refills | Status: DC

## 2019-03-03 NOTE — Assessment & Plan Note (Signed)
Due for labs today. Await results.  

## 2019-03-03 NOTE — Assessment & Plan Note (Signed)
Due for labs today. Await results.

## 2019-03-04 LAB — COMPREHENSIVE METABOLIC PANEL
ALT: 19 IU/L (ref 0–44)
AST: 24 IU/L (ref 0–40)
Albumin/Globulin Ratio: 2 (ref 1.2–2.2)
Albumin: 4.2 g/dL (ref 3.6–4.6)
Alkaline Phosphatase: 56 IU/L (ref 39–117)
BUN/Creatinine Ratio: 9 — ABNORMAL LOW (ref 10–24)
BUN: 7 mg/dL — ABNORMAL LOW (ref 8–27)
Bilirubin Total: 0.7 mg/dL (ref 0.0–1.2)
CO2: 22 mmol/L (ref 20–29)
Calcium: 9 mg/dL (ref 8.6–10.2)
Chloride: 103 mmol/L (ref 96–106)
Creatinine, Ser: 0.75 mg/dL — ABNORMAL LOW (ref 0.76–1.27)
GFR calc Af Amer: 94 mL/min/{1.73_m2} (ref >59)
GFR calc non Af Amer: 81 mL/min/{1.73_m2} (ref >59)
Globulin, Total: 2.1 g/dL (ref 1.5–4.5)
Glucose: 88 mg/dL (ref 65–99)
Potassium: 3.9 mmol/L (ref 3.5–5.2)
Sodium: 139 mmol/L (ref 134–144)
Total Protein: 6.3 g/dL (ref 6.0–8.5)

## 2019-03-04 LAB — LIPID PANEL W/O CHOL/HDL RATIO
Cholesterol, Total: 99 mg/dL — ABNORMAL LOW (ref 100–199)
HDL: 47 mg/dL (ref >39)
LDL Chol Calc (NIH): 36 mg/dL (ref 0–99)
Triglycerides: 74 mg/dL (ref 0–149)
VLDL Cholesterol Cal: 16 mg/dL (ref 5–40)

## 2019-03-06 ENCOUNTER — Encounter: Payer: Self-pay | Admitting: Family Medicine

## 2019-03-10 ENCOUNTER — Other Ambulatory Visit: Payer: Self-pay

## 2019-03-10 ENCOUNTER — Encounter: Payer: Self-pay | Admitting: Family Medicine

## 2019-03-10 ENCOUNTER — Ambulatory Visit (INDEPENDENT_AMBULATORY_CARE_PROVIDER_SITE_OTHER): Payer: Medicare Other | Admitting: Family Medicine

## 2019-03-10 VITALS — BP 164/71 | HR 61 | Temp 98.5°F

## 2019-03-10 DIAGNOSIS — L03115 Cellulitis of right lower limb: Secondary | ICD-10-CM

## 2019-03-10 DIAGNOSIS — R609 Edema, unspecified: Secondary | ICD-10-CM

## 2019-03-10 MED ORDER — SULFAMETHOXAZOLE-TRIMETHOPRIM 800-160 MG PO TABS: 1.0000 | ORAL_TABLET | Freq: Two times a day (BID) | ORAL | 0 refills | Status: DC

## 2019-03-10 NOTE — Progress Notes (Signed)
BP (!) 164/71   Pulse 61   Temp 98.5 F (36.9 C)   SpO2 98%    Subjective:    Patient ID: Nicholas Gross, male    DOB: 03/27/1928, 85 y.o.   MRN: [id number]  HPI: Nicholas Gross is a 85 y.o. male  Chief Complaint  Patient presents with  . Cellulitis   Anvay notes that he is feeling well. He is tolerating the medication well. He has not been dizzy. No fevers. No SOB. Continues with swelling in his R leg and with redness and some discomfort in his R foot. He is otherwise doing well with no other concerns or complaints at this time.   Relevant past medical, surgical, family and social history reviewed and updated as indicated. Interim medical history since our last visit reviewed. Allergies and medications reviewed and updated.  Review of Systems  Constitutional: Negative.   Respiratory: Negative.   Cardiovascular: Positive for leg swelling. Negative for chest pain and palpitations.  Musculoskeletal: Negative.   Skin: Positive for color change and rash. Negative for pallor and wound.  Neurological: Negative.   Psychiatric/Behavioral: Negative.     Per HPI unless specifically indicated above     Objective:    BP (!) 164/71   Pulse 61   Temp 98.5 F (36.9 C)   SpO2 98%   Wt Readings from Last 3 Encounters:  08/30/18 183 lb 12.8 oz (83.4 kg)  08/30/18 183 lb 8 oz (83.2 kg)  02/26/18 175 lb (79.4 kg)    Physical Exam Vitals signs and nursing note reviewed.  Constitutional:      General: He is not in acute distress.    Appearance: Normal appearance. He is not ill-appearing, toxic-appearing or diaphoretic.  HENT:     Head: Normocephalic and atraumatic.     Right Ear: External ear normal.     Left Ear: External ear normal.     Nose: Nose normal.     Mouth/Throat:     Mouth: Mucous membranes are moist.     Pharynx: Oropharynx is clear.  Eyes:     General: No scleral icterus.       Right eye: No discharge.        Left eye: No discharge.     Extraocular  Movements: Extraocular movements intact.     Conjunctiva/sclera: Conjunctivae normal.     Pupils: Pupils are equal, round, and reactive to light.  Neck:     Musculoskeletal: Normal range of motion and neck supple.  Cardiovascular:     Rate and Rhythm: Normal rate and regular rhythm.     Pulses: Normal pulses.     Heart sounds: Normal heart sounds. No murmur. No friction rub. No gallop.   Pulmonary:     Effort: Pulmonary effort is normal. No respiratory distress.     Breath sounds: Normal breath sounds. No stridor. No wheezing, rhonchi or rales.  Chest:     Chest wall: No tenderness.  Musculoskeletal: Normal range of motion.  Skin:    General: Skin is warm and dry.     Capillary Refill: Capillary refill takes less than 2 seconds.     Coloration: Skin is not jaundiced or pale.     Findings: No bruising, erythema, lesion or rash.  Neurological:     General: No focal deficit present.     Mental Status: He is alert and oriented to person, place, and time. Mental status is at baseline.  Psychiatric:  Mood and Affect: Mood normal.        Behavior: Behavior normal.        Thought Content: Thought content normal.        Judgment: Judgment normal.     Results for orders placed or performed in visit on 03/03/19  Lipid Panel w/o Chol/HDL Ratio  Result Value Ref Range   Cholesterol, Total 99 (L) 100 - 199 mg/dL   Triglycerides 74 0 - 149 mg/dL   HDL 47 >16>39 mg/dL   VLDL Cholesterol Cal 16 5 - 40 mg/dL   LDL Chol Calc (NIH) 36 0 - 99 mg/dL  Comprehensive metabolic panel  Result Value Ref Range   Glucose 88 65 - 99 mg/dL   BUN 7 (L) 8 - 27 mg/dL   Creatinine, Ser 1.090.75 (L) 0.76 - 1.27 mg/dL   GFR calc non Af Amer 81 >59 mL/min/1.73   GFR calc Af Amer 94 >59 mL/min/1.73   BUN/Creatinine Ratio 9 (L) 10 - 24   Sodium 139 134 - 144 mmol/L   Potassium 3.9 3.5 - 5.2 mmol/L   Chloride 103 96 - 106 mmol/L   CO2 22 20 - 29 mmol/L   Calcium 9.0 8.6 - 10.2 mg/dL   Total Protein 6.3 6.0  - 8.5 g/dL   Albumin 4.2 3.6 - 4.6 g/dL   Globulin, Total 2.1 1.5 - 4.5 g/dL   Albumin/Globulin Ratio 2.0 1.2 - 2.2   Bilirubin Total 0.7 0.0 - 1.2 mg/dL   Alkaline Phosphatase 56 39 - 117 IU/L   AST 24 0 - 40 IU/L   ALT 19 0 - 44 IU/L      Assessment & Plan:   Problem List Items Addressed This Visit    None    Visit Diagnoses    Cellulitis of right foot    -  Primary   Continues with redness and heat of foot- but improved. We will do another week of bactrim and recheck in 1 week.    Peripheral edema       Continues with 3+ edema. Continues to refuse US- will continue lasix, check BMP and recheck 1 week.    Relevant Orders   Basic metabolic panel       Follow up plan: Return in about 1 week (around 03/17/2019).

## 2019-03-11 ENCOUNTER — Encounter: Payer: Self-pay | Admitting: Family Medicine

## 2019-03-11 LAB — BASIC METABOLIC PANEL
BUN/Creatinine Ratio: 9 — ABNORMAL LOW (ref 10–24)
BUN: 9 mg/dL (ref 8–27)
CO2: 23 mmol/L (ref 20–29)
Calcium: 9 mg/dL (ref 8.6–10.2)
Chloride: 98 mmol/L (ref 96–106)
Creatinine, Ser: 1 mg/dL (ref 0.76–1.27)
GFR calc Af Amer: 77 mL/min/{1.73_m2} (ref >59)
GFR calc non Af Amer: 66 mL/min/{1.73_m2} (ref >59)
Glucose: 98 mg/dL (ref 65–99)
Potassium: 4 mmol/L (ref 3.5–5.2)
Sodium: 134 mmol/L (ref 134–144)

## 2019-03-18 ENCOUNTER — Encounter: Payer: Self-pay | Admitting: Family Medicine

## 2019-03-18 ENCOUNTER — Ambulatory Visit (INDEPENDENT_AMBULATORY_CARE_PROVIDER_SITE_OTHER): Payer: Medicare Other | Admitting: Family Medicine

## 2019-03-18 ENCOUNTER — Other Ambulatory Visit: Payer: Self-pay

## 2019-03-18 ENCOUNTER — Ambulatory Visit: Payer: Self-pay | Admitting: *Deleted

## 2019-03-18 ENCOUNTER — Other Ambulatory Visit (INDEPENDENT_AMBULATORY_CARE_PROVIDER_SITE_OTHER): Payer: Self-pay | Admitting: Nurse Practitioner

## 2019-03-18 VITALS — BP 154/77 | HR 64 | Temp 98.2°F | Wt 175.0 lb

## 2019-03-18 DIAGNOSIS — Z23 Encounter for immunization: Secondary | ICD-10-CM

## 2019-03-18 DIAGNOSIS — M7989 Other specified soft tissue disorders: Secondary | ICD-10-CM

## 2019-03-18 NOTE — Progress Notes (Signed)
BP (!) 154/77   Pulse 64   Temp 98.2 F (36.8 C) (Oral)   Wt 175 lb (79.4 kg)   SpO2 98%   BMI 25.33 kg/m    Subjective:    Patient ID: Nicholas Gross, male    DOB: 04-30-1928, 85 y.o.   MRN: [id number]  HPI: Nicholas Gross is a 85 y.o. male  Chief Complaint  Patient presents with  . Cellulitis    1w f/u    Redness is gone. No more pain, continues with significant swelling of R leg. No pain, No numbness or tingling. Otherwise doing well. No other concerns.   Relevant past medical, surgical, family and social history reviewed and updated as indicated. Interim medical history since our last visit reviewed. Allergies and medications reviewed and updated.  Review of Systems  Constitutional: Negative.   Respiratory: Negative.   Cardiovascular: Positive for leg swelling. Negative for chest pain and palpitations.  Gastrointestinal: Negative.   Musculoskeletal: Negative.   Psychiatric/Behavioral: Negative.     Per HPI unless specifically indicated above     Objective:    BP (!) 154/77   Pulse 64   Temp 98.2 F (36.8 C) (Oral)   Wt 175 lb (79.4 kg)   SpO2 98%   BMI 25.33 kg/m   Wt Readings from Last 3 Encounters:  03/18/19 175 lb (79.4 kg)  08/30/18 183 lb 12.8 oz (83.4 kg)  08/30/18 183 lb 8 oz (83.2 kg)    Physical Exam Vitals signs and nursing note reviewed.  Constitutional:      General: He is not in acute distress.    Appearance: Normal appearance. He is not ill-appearing, toxic-appearing or diaphoretic.  HENT:     Head: Normocephalic and atraumatic.     Right Ear: External ear normal.     Left Ear: External ear normal.     Nose: Nose normal.     Mouth/Throat:     Mouth: Mucous membranes are moist.     Pharynx: Oropharynx is clear.  Eyes:     General: No scleral icterus.       Right eye: No discharge.        Left eye: No discharge.     Extraocular Movements: Extraocular movements intact.     Conjunctiva/sclera: Conjunctivae normal.   Pupils: Pupils are equal, round, and reactive to light.  Neck:     Musculoskeletal: Normal range of motion and neck supple.  Cardiovascular:     Rate and Rhythm: Normal rate and regular rhythm.     Pulses: Normal pulses.     Heart sounds: Normal heart sounds. No murmur. No friction rub. No gallop.   Pulmonary:     Effort: Pulmonary effort is normal. No respiratory distress.     Breath sounds: Normal breath sounds. No stridor. No wheezing, rhonchi or rales.  Chest:     Chest wall: No tenderness.  Musculoskeletal: Normal range of motion.     Right lower leg: Edema (3++ edema, no redness ) present.  Skin:    General: Skin is warm and dry.     Capillary Refill: Capillary refill takes less than 2 seconds.     Coloration: Skin is not jaundiced or pale.     Findings: No bruising, erythema, lesion or rash.  Neurological:     General: No focal deficit present.     Mental Status: He is alert and oriented to person, place, and time. Mental status is at baseline.  Psychiatric:  Mood and Affect: Mood normal.        Behavior: Behavior normal.        Thought Content: Thought content normal.        Judgment: Judgment normal.     Results for orders placed or performed in visit on 03/10/19  Basic metabolic panel  Result Value Ref Range   Glucose 98 65 - 99 mg/dL   BUN 9 8 - 27 mg/dL   Creatinine, Ser 8.56 0.76 - 1.27 mg/dL   GFR calc non Af Amer 66 >59 mL/min/1.73   GFR calc Af Amer 77 >59 mL/min/1.73   BUN/Creatinine Ratio 9 (L) 10 - 24   Sodium 134 134 - 144 mmol/L   Potassium 4.0 3.5 - 5.2 mmol/L   Chloride 98 96 - 106 mmol/L   CO2 23 20 - 29 mmol/L   Calcium 9.0 8.6 - 10.2 mg/dL      Assessment & Plan:   Problem List Items Addressed This Visit    None    Visit Diagnoses    Right leg swelling    -  Primary   Declines [place]- significant concern for clot. Cellulitis resolved. Will get urgent referral to vascular. Call with any concerns. Continue to monitor.    Relevant Orders    Ambulatory referral to Vascular Surgery   Referral to Chronic Care Management Services   Flu vaccine need       Flu shot given today.   Relevant Orders   Flu Vaccine QUAD High Dose(Fluad) (Completed)       Follow up plan: Return in about 3 months (around 06/17/2019).

## 2019-03-18 NOTE — Chronic Care Management (AMB) (Signed)
Chronic Care Management   Initial Visit Note  03/18/2019 Name: Nicholas Gross MRN: [id number] DOB: 1927-11-18  Referred by: [name], DO Reason for referral : Chronic Care Management (Cellulitis ) and Care Coordination (Vein appt. )   Nicholas Gross is a 85 y.o. year old male who is a primary care patient of [name], DO. The CCM team was consulted for assistance with chronic disease management and care coordination needs.   Review of patient status, including review of consultants reports, relevant laboratory and other test results, and collaboration with appropriate care team members and the patient's provider was performed as part of comprehensive patient evaluation and provision of chronic care management services.    SDOH (Social Determinants of Health) screening performed today: Transportation. See Care Plan for related entries.   Advanced Directives Status: N See Care Plan and Vynca application for related entries.   Medications: Outpatient Encounter Medications as of 03/18/2019  Medication Sig  . aspirin EC 81 MG tablet Take 1 tablet (81 mg total) by mouth daily.  . furosemide (LASIX) 20 MG tablet Take 1 tablet (20 mg total) by mouth daily.  . Multiple Vitamins-Minerals (PRESERVISION AREDS PO) Take by mouth 2 (two) times daily.  . rosuvastatin (CRESTOR) 10 MG tablet Take 1 tablet (10 mg total) by mouth daily.   No facility-administered encounter medications on file as of 03/18/2019.      Objective:   Goals Addressed            This Visit's Progress   . RN-I need help with my Vein doctor appoinment (pt-stated)       Current Barriers:  [name] Knowledge Deficits related to obtaining vein and vascular appointment for swelling of RLE  . Lacks caregiver support.  . Transportation barriers . Low-vision/unable to write appts down related to Macular degeneration  Nurse Case Manager Clinical Goal(s):  [name] Over the next 90 days, patient will work with [facility] to  address needs related to needed appointments for swelling to right lower extremity.   Interventions:  . Evaluation of current treatment plan related to RLE swelling and patient's adherence to plan as established by provider. Nash Dimmer with PCP and [facility] regarding patient's need for an appointment to evaluate swelling to RLE . Discussed plans with patient for ongoing care management follow up and provided patient with direct contact information for care management team . Reviewed scheduled/upcoming provider appointments including: Monday 9/28 @ 1pm with [facility] for RLE Ultra-sound.  [name] Spoke with patient and patient's neighbor Lynford Humphrey to discuss patient's need for V&V Appt. Neighbor Hassan Rowan willing to assist in getting patient to appt.  Aliene Beams once appt was set and made her aware of appt date and time and address of the office. [name] [phone] . Ms. Hassell Done stated please let her know if patient had any further needs.   Patient Self Care Activities:  . Currently UNABLE TO independently write down appts or transport self to appts related to low vision.   Initial goal documentation         Mr. Mackert was given information about Chronic Care Management services today including:  1. CCM service includes personalized support from designated clinical staff supervised by his physician, including individualized plan of care and coordination with other care providers 2. 24/7 contact phone numbers for assistance for urgent and routine care needs. 3. Service will only be billed when office clinical staff spend 20 minutes or more in  a month to coordinate care. 4. Only one practitioner may furnish and bill the service in a calendar month. 5. The patient may stop CCM services at any time (effective at the end of the month) by phone call to the office staff. 6. The patient will be responsible for cost sharing (co-pay) of up to 20% of the service fee (after  annual deductible is met).  Patient agreed to services and verbal consent obtained.   Plan:   The care management team will reach out to the patient again over the next 30 days.  The patient has been provided with contact information for the care management team and has been advised to call with any health related questions or concerns.   [name] Ladarrian Asencio RN, BSN Nurse Case Editor, commissioning Family Practice/[facility] Care Management  [phone]) Business Mobile

## 2019-03-20 NOTE — Patient Instructions (Signed)
Thank you allowing the Chronic Care Management Team to be a part of your care! It was a pleasure speaking with you today!   CCM (Chronic Care Management) Team   Mkayla Steele RN, BSN Nurse Care Coordinator  [phone]  Catie [facility] PharmD  Clinical Pharmacist  [phone]  Eula Fried LCSW Clinical Social Worker [phone]  Goals Addressed            This Visit's Progress   . RN-I need help with my Vein doctor appoinment (pt-stated)       Current Barriers:  [name] Knowledge Deficits related to obtaining vein and vascular appointment for swelling of RLE  . Lacks caregiver support.  . Transportation barriers . Low-vision/unable to write appts down related to Macular degeneration  Nurse Case Manager Clinical Goal(s):  [name] Over the next 90 days, patient will work with [facility] to address needs related to needed appointments for swelling to right lower extremity.   Interventions:  . Evaluation of current treatment plan related to RLE swelling and patient's adherence to plan as established by provider. Nash Dimmer with PCP and [facility] regarding patient's need for an appointment to evaluate swelling to RLE . Discussed plans with patient for ongoing care management follow up and provided patient with direct contact information for care management team . Reviewed scheduled/upcoming provider appointments including: Monday 9/28 @ 1pm with [facility] for RLE Ultra-sound.  [name] Spoke with patient and patient's neighbor Lynford Humphrey to discuss patient's need for V&V Appt. Neighbor Hassan Rowan willing to assist in getting patient to appt.  Aliene Beams once appt was set and made her aware of appt date and time and address of the office. [name] [phone] . Ms. Hassell Done stated please let her know if patient had any further needs.   Patient Self Care Activities:  . Currently UNABLE TO independently write down appts or transport self to appts related to low vision.    Initial goal documentation        The patient verbalized understanding of instructions provided today and declined a print copy of patient instruction materials.   The patient has been provided with contact information for the care management team and has been advised to call with any health related questions or concerns.

## 2019-03-21 ENCOUNTER — Other Ambulatory Visit: Payer: Self-pay

## 2019-03-21 ENCOUNTER — Other Ambulatory Visit (INDEPENDENT_AMBULATORY_CARE_PROVIDER_SITE_OTHER): Payer: Self-pay | Admitting: Family Medicine

## 2019-03-21 ENCOUNTER — Ambulatory Visit (INDEPENDENT_AMBULATORY_CARE_PROVIDER_SITE_OTHER): Payer: Medicare Other

## 2019-03-21 ENCOUNTER — Other Ambulatory Visit (INDEPENDENT_AMBULATORY_CARE_PROVIDER_SITE_OTHER): Payer: Self-pay | Admitting: Nurse Practitioner

## 2019-03-21 DIAGNOSIS — M7989 Other specified soft tissue disorders: Secondary | ICD-10-CM

## 2019-03-21 DIAGNOSIS — R600 Localized edema: Secondary | ICD-10-CM

## 2019-03-21 DIAGNOSIS — M79661 Pain in right lower leg: Secondary | ICD-10-CM

## 2019-03-22 ENCOUNTER — Ambulatory Visit: Payer: Self-pay | Admitting: *Deleted

## 2019-03-22 DIAGNOSIS — M7989 Other specified soft tissue disorders: Secondary | ICD-10-CM

## 2019-03-22 DIAGNOSIS — R609 Edema, unspecified: Secondary | ICD-10-CM

## 2019-03-22 NOTE — Chronic Care Management (AMB) (Signed)
Chronic Care Management   Follow Up Note   03/22/2019 Name: Nicholas Gross MRN: [id number] DOB: [birth year]  Referred by: Dorcas Carrow, DO Reason for referral : No chief complaint on file.   Nicholas Gross is a 85 y.o. year old male who is a primary care patient of Dorcas Carrow, DO. The CCM team was consulted for assistance with chronic disease management and care coordination needs.    Review of patient status, including review of consultants reports, relevant laboratory and other test results, and collaboration with appropriate care team members and the patient's provider was performed as part of comprehensive patient evaluation and provision of chronic care management services.    SDOH (Social Determinants of Health) screening performed today: Transportation. See Care Plan for related entries.   Advanced Directives Status: N See Care Plan and Vynca application for related entries.  Outpatient Encounter Medications as of 03/22/2019  Medication Sig  . aspirin EC 81 MG tablet Take 1 tablet (81 mg total) by mouth daily.  . furosemide (LASIX) 20 MG tablet Take 1 tablet (20 mg total) by mouth daily.  . Multiple Vitamins-Minerals (PRESERVISION AREDS PO) Take by mouth 2 (two) times daily.  . rosuvastatin (CRESTOR) 10 MG tablet Take 1 tablet (10 mg total) by mouth daily.   No facility-administered encounter medications on file as of 03/22/2019.      Goals Addressed            This Visit's Progress   . RN-I need help with my Vein doctor appoinment (pt-stated)       Current Barriers:  [name] Knowledge Deficits related to obtaining vein and vascular appointment for swelling of RLE  . Lacks caregiver support.  . Transportation barriers . Low-vision/unable to write appts down related to Macular degeneration  Nurse Case Manager Clinical Goal(s):  [name] Over the next 90 days, patient will work with [facility] to address needs related to needed appointments for swelling to right lower  extremity.   Interventions:  . Evaluation of current treatment plan related to RLE swelling and patient's adherence to plan as established by provider. . Reviewed medications with patient and discussed need to continue fluid medication daily-Neighbor reported patient had skipped fluid pill x2 days related to he did not want to be running to the bathroom during church or while at MD appt yessterday.  [name] with PCP  regarding patient's needs for follow up. PCP recommends f/u with Dr. Wyn Quaker and a 3 month f/u with PCP . Discussed plans with patient for ongoing care management follow up and provided patient with direct contact information for care management team- Ms Caryn Section given [facility] contact number also.  [name] Spoke with patient and patient's neighbor Holley Raring to discuss patient's need for V&V Appt. Neighbor Gavin Pound willing to assist in getting patient an appt. And providing transportation. Holley Raring [phone] Dew 10/2 . Ms. Caryn Section stated please let her know if patient had any further needs.  . Requested front office clinical staff make a 3 month appt for patient and mail the information to his home.   Patient Self Care Activities:  . Currently UNABLE TO independently write down appts or transport self to appts related to low vision.   Initial goal documentation         The care management team will reach out to the patient again over the next 30 days.  The patient has been provided with contact information for the care management team and has been advised to  call with any health related questions or concerns.   [name] Annamarie Yamaguchi RN, BSN Nurse Case Editor, commissioning Family Practice/[facility] Care Management  [phone]) Business Mobile

## 2019-03-22 NOTE — Patient Instructions (Signed)
Thank you allowing the Chronic Care Management Team to be a part of your care! It was a pleasure speaking with you today!  CCM (Chronic Care Management) Team   Pricila Bridge RN, BSN Nurse Care Coordinator  [phone]  Catie [facility] PharmD  Clinical Pharmacist  [phone]  Eula Fried LCSW Clinical Social Worker [phone]  Goals Addressed            This Visit's Progress   . RN-I need help with my Vein doctor appoinment (pt-stated)       Current Barriers:  [name] Knowledge Deficits related to obtaining vein and vascular appointment for swelling of RLE  . Lacks caregiver support.  . Transportation barriers . Low-vision/unable to write appts down related to Macular degeneration  Nurse Case Manager Clinical Goal(s):  [name] Over the next 90 days, patient will work with [facility] to address needs related to needed appointments for swelling to right lower extremity.   Interventions:  . Evaluation of current treatment plan related to RLE swelling and patient's adherence to plan as established by provider. . Reviewed medications with patient and discussed need to continue fluid medication daily-Neighbor reported patient had skipped fluid pill x2 days related to he did not want to be running to the bathroom during church or while at MD appt yessterday.  Nash Dimmer with PCP  regarding patient's needs for follow up. PCP recommends f/u with [name] and a 3 month f/u with PCP . Discussed plans with patient for ongoing care management follow up and provided patient with direct contact information for care management team- Ms Hassell Done given [facility] contact number also.  [name] Spoke with patient and patient's neighbor [name] to discuss patient's need for V&V Appt. Neighbor [name] willing to assist in getting patient an appt. And providing transportation. [name] [phone] Dew 10/2 . Ms. Hassell Done stated please let her know if patient had any further needs.  . Requested front office clinical staff  make a 3 month appt for patient and mail the information to his home.   Patient Self Care Activities:  . Currently UNABLE TO independently write down appts or transport self to appts related to low vision.   Initial goal documentation        The patient verbalized understanding of instructions provided today and declined a print copy of patient instruction materials.   The patient has been provided with contact information for the care management team and has been advised to call with any health related questions or concerns.

## 2019-03-22 NOTE — Patient Instructions (Signed)
Thank you allowing the Chronic Care Management Team to be a part of your care! It was a pleasure speaking with you today!   CCM (Chronic Care Management) Team   Alanis Clift RN, BSN Nurse Care Coordinator  [phone]  Catie [facility] PharmD  Clinical Pharmacist  [phone]  Eula Fried LCSW Clinical Social Worker [phone]  Goals Addressed            This Visit's Progress   . RN-I need help with my Vein doctor appoinment (pt-stated)       Current Barriers:  [name] Knowledge Deficits related to obtaining vein and vascular appointment for swelling of RLE  . Lacks caregiver support.  . Transportation barriers . Low-vision/unable to write appts down related to Macular degeneration  Nurse Case Manager Clinical Goal(s):  [name] Over the next 90 days, patient will work with [facility] to address needs related to needed appointments for swelling to right lower extremity.   Interventions:  . Evaluation of current treatment plan related to RLE swelling and patient's adherence to plan as established by provider. . Reviewed medications with patient and discussed need to continue fluid medication daily-Neighbor reported patient had skipped fluid pill x2 days related to he did not want to be running to the bathroom during church or while at MD appt yessterday.  Nash Dimmer with PCP  regarding patient's needs for follow up. PCP recommends f/u with [name] and a 3 month f/u with PCP . Discussed plans with patient for ongoing care management follow up and provided patient with direct contact information for care management team- Ms Hassell Done given [facility] contact number also.  [name] Spoke with patient and patient's neighbor [name] to discuss patient's need for V&V Appt. Neighbor [name] willing to assist in getting patient an appt. And providing transportation. [name] [phone] . Ms. Hassell Done stated please let her know if patient had any further needs.  . Requested front office clinical staff make a 3  month appt for patient and mail the information to his home.   Patient Self Care Activities:  . Currently UNABLE TO independently write down appts or transport self to appts related to low vision.   Initial goal documentation        The patient verbalized understanding of instructions provided today and declined a print copy of patient instruction materials.   The patient has been provided with contact information for the care management team and has been advised to call with any health related questions or concerns.

## 2019-03-22 NOTE — Chronic Care Management (AMB) (Signed)
Chronic Care Management   Follow Up Note   03/22/2019 Name: Nicholas Gross MRN: [id number] DOB: 1927-12-10  Referred by: [name], DO Reason for referral : No chief complaint on file.   Nicholas Gross is a 85 y.o. year old male who is a primary care patient of [name], DO. The CCM team was consulted for assistance with chronic disease management and care coordination needs.    Review of patient status, including review of consultants reports, relevant laboratory and other test results, and collaboration with appropriate care team members and the patient's provider was performed as part of comprehensive patient evaluation and provision of chronic care management services.    SDOH (Social Determinants of Health) screening performed today: Transportation. See Care Plan for related entries.   Advanced Directives Status: N See Care Plan and Vynca application for related entries.  Outpatient Encounter Medications as of 03/22/2019  Medication Sig  . aspirin EC 81 MG tablet Take 1 tablet (81 mg total) by mouth daily.  . furosemide (LASIX) 20 MG tablet Take 1 tablet (20 mg total) by mouth daily.  . Multiple Vitamins-Minerals (PRESERVISION AREDS PO) Take by mouth 2 (two) times daily.  . rosuvastatin (CRESTOR) 10 MG tablet Take 1 tablet (10 mg total) by mouth daily.   No facility-administered encounter medications on file as of 03/22/2019.      Goals Addressed            This Visit's Progress   . RN-I need help with my Vein doctor appoinment (pt-stated)       Current Barriers:  [name] Knowledge Deficits related to obtaining vein and vascular appointment for swelling of RLE  . Lacks caregiver support.  . Transportation barriers . Low-vision/unable to write appts down related to Macular degeneration  Nurse Case Manager Clinical Goal(s):  [name] Over the next 90 days, patient will work with [facility] to address needs related to needed appointments for swelling to right lower  extremity.   Interventions:  . Evaluation of current treatment plan related to RLE swelling and patient's adherence to plan as established by provider. . Reviewed medications with patient and discussed need to continue fluid medication daily-Neighbor reported patient had skipped fluid pill x2 days related to he did not want to be running to the bathroom during church or while at MD appt yessterday.  Nash Dimmer with PCP  regarding patient's needs for follow up. PCP recommends f/u with [name] and a 3 month f/u with PCP . Discussed plans with patient for ongoing care management follow up and provided patient with direct contact information for care management team- Ms Hassell Done given [facility] contact number also.  [name] Spoke with patient and patient's neighbor [name] to discuss patient's need for V&V Appt. Neighbor [name] willing to assist in getting patient an appt. And providing transportation. [name] [phone] . Ms. Hassell Done stated please let her know if patient had any further needs.  . Requested front office clinical staff make a 3 month appt for patient and mail the information to his home.   Patient Self Care Activities:  . Currently UNABLE TO independently write down appts or transport self to appts related to low vision.   Initial goal documentation         The care management team will reach out to the patient again over the next 30 days.  The patient has been provided with contact information for the care management team and has been advised to call with  any health related questions or concerns.    [name] [name] RN, BSN Nurse Case Editor, commissioning Family Practice/[facility] Care Management  [phone]) Business Mobile

## 2019-03-25 ENCOUNTER — Ambulatory Visit (INDEPENDENT_AMBULATORY_CARE_PROVIDER_SITE_OTHER): Payer: Medicare Other | Admitting: Vascular Surgery

## 2019-03-25 ENCOUNTER — Encounter (INDEPENDENT_AMBULATORY_CARE_PROVIDER_SITE_OTHER): Payer: Self-pay | Admitting: Vascular Surgery

## 2019-03-25 ENCOUNTER — Other Ambulatory Visit: Payer: Self-pay

## 2019-03-25 VITALS — BP 163/73 | HR 58 | Resp 16 | Wt 175.0 lb

## 2019-03-25 DIAGNOSIS — M7989 Other specified soft tissue disorders: Secondary | ICD-10-CM | POA: Insufficient documentation

## 2019-03-25 DIAGNOSIS — I6529 Occlusion and stenosis of unspecified carotid artery: Secondary | ICD-10-CM

## 2019-03-25 DIAGNOSIS — I10 Essential (primary) hypertension: Secondary | ICD-10-CM

## 2019-03-25 NOTE — Assessment & Plan Note (Signed)
he underwent a duplex which showed no evidence of DVT or superficial thrombophlebitis.  Some reflux was seen in the right popliteal vein. I suspect he has a significant component of lymphedema from chronic scarring and lymphatic channels.  I prescribed him compression stockings and recommend he wear these daily.  We will see him back in 4 to 6 weeks and follow-up to see his response to conservative therapy and determine whether or not a lymphedema pump would be a good adjuvant therapy.

## 2019-03-25 NOTE — Progress Notes (Signed)
MRN : [id number]  Nicholas Gross is a 85 y.o. (02-01-1928) male who presents with chief complaint of  Chief Complaint  Patient presents with  . Follow-up    ultrasound results  .  History of Present Illness: Patient returns today after not being seen in many years for other vascular issues.  He has been having pain and swelling in the right leg.  He got a 2-week course of antibiotics for presumed cellulitis.  This is helped the swelling pain somewhat but not resolved.  He is beginning to get some swelling and discomfort in the left leg.  He also has cramps many nights that wake him from sleep in both legs.  He is many years status post an endarterectomy for carotid disease and has not had this checked in many years.  The antibiotics improve the redness but the swelling in the right leg persists.  Earlier this week, he underwent a duplex which showed no evidence of DVT or superficial thrombophlebitis.  Some reflux was seen in the right popliteal vein.  Current Outpatient Medications  Medication Sig Dispense Refill  . aspirin EC 81 MG tablet Take 1 tablet (81 mg total) by mouth daily. 100 tablet 12  . furosemide (LASIX) 20 MG tablet Take 1 tablet (20 mg total) by mouth daily. 30 tablet 3  . Multiple Vitamins-Minerals (PRESERVISION AREDS PO) Take by mouth 2 (two) times daily.    . rosuvastatin (CRESTOR) 10 MG tablet Take 1 tablet (10 mg total) by mouth daily. 90 tablet 1   No current facility-administered medications for this visit.     Past Medical History:  Diagnosis Date  . CAD (coronary artery disease)   . History of GI bleed 2008  . History of tobacco use    17 pack years, quit around 1970  . Hyperlipidemia   . Hypertension   . Overweight     Past Surgical History:  Procedure Laterality Date  . CAROTID ENDARTERECTOMY Left 2010  . CATARACT EXTRACTION    . cypher stent  09/12/02   s/p cypher stent mid-LAD  . TENDON REPAIR  1991   finger and arm    Social History  Social History   Tobacco Use  . Smoking status: Former Smoker    Packs/day: 1.00    Years: 17.00    Pack years: 17.00    Types: Cigarettes    Quit date: 06/23/1968    Years since quitting: 50.7  . Smokeless tobacco: Never Used  Substance Use Topics  . Alcohol use: No  . Drug use: No     Family History Family History  Problem Relation Age of Onset  . Heart disease Father        possibly  . Cancer Sister        breast  . AAA (abdominal aortic aneurysm) Brother   . Cancer Sister        lung     Allergies  Allergen Reactions  . Other     Patient states that he is allergic to something that they place in the IV before they work on you  . Simvastatin Other (See Comments)    Myalgia      REVIEW OF SYSTEMS (Negative unless checked)  Constitutional: [] Weight loss  [] Fever  [] Chills Cardiac: [] Chest pain   [] Chest pressure   [] Palpitations   [] Shortness of breath when laying flat   [] Shortness of breath at rest   [] Shortness of breath with exertion. Vascular:  [] Pain in legs  with walking   Pain in legs at rest   Pain in legs when laying flat   Claudication   Pain in feet when walking  Pain in feet at rest  Pain in feet when laying flat   History of DVT   Phlebitis   Swelling in legs   Varicose veins   Non-healing ulcers Pulmonary:   Uses home oxygen   Productive cough   Hemoptysis   Wheeze  COPD   Asthma Neurologic:  Dizziness  Blackouts   Seizures   History of stroke   History of TIA  Aphasia   Temporary blindness   Dysphagia   Weakness or numbness in arms   Weakness or numbness in legs Musculoskeletal:  Arthritis   Joint swelling   Joint pain   Low back pain Hematologic:  Easy bruising  Easy bleeding   Hypercoagulable state   Anemic   Gastrointestinal:  Blood in stool   Vomiting blood  Gastroesophageal reflux/heartburn   Abdominal pain Genitourinary:  Chronic kidney disease   Difficult  urination  Frequent urination  Burning with urination   Hematuria Skin:  Rashes   Ulcers   Wounds Psychological:  History of anxiety    History of major depression.  Physical Examination  BP (!) 163/73 (BP Location: Left Arm)   Pulse (!) 58   Resp 16   Wt 175 lb (79.4 kg)   BMI 25.33 kg/m  Gen:  WD/WN, NAD. Appears younger than stated age.  Head: [place]/AT, No temporalis wasting. Ear/Nose/Throat: Hearing grossly intact, nares w/o erythema or drainage Eyes: Conjunctiva clear. Sclera non-icteric Neck: Supple.  Trachea midline Pulmonary:  Good air movement, no use of accessory muscles.  Cardiac: RRR, no JVD Vascular:  Vessel Right Left  Radial Palpable Palpable                          PT  trace palpable  1+ palpable  DP Palpable Palpable   Gastrointestinal: soft, non-tender/non-distended. No guarding/reflex.  Musculoskeletal: M/S 5/5 throughout.  No deformity or atrophy.  1-2+ right lower extremity edema, trace left lower extremity edema.  Stasis changes are present bilaterally worse on the right than the left with some mild erythema Neurologic: Sensation grossly intact in extremities.  Symmetrical.  Speech is fluent.  Psychiatric: Judgment intact, Mood & affect appropriate for pt's clinical situation. Dermatologic: No rashes or ulcers noted.  No cellulitis or open wounds.       Labs Recent Results (from the past 2160 hour(s))  Lipid Panel w/o Chol/HDL Ratio     Status: Abnormal   Collection Time: 03/03/19  9:25 AM  Result Value Ref Range   Cholesterol, Total 99 (L) 100 - 199 mg/dL   Triglycerides 74 0 - 149 mg/dL   HDL 47 >41 mg/dL   VLDL Cholesterol Cal 16 5 - 40 mg/dL   LDL Chol Calc (NIH) 36 0 - 99 mg/dL  Comprehensive metabolic panel     Status: Abnormal   Collection Time: 03/03/19  9:25 AM  Result Value Ref Range   Glucose 88 65 - 99 mg/dL   BUN 7 (L) 8 - 27 mg/dL   Creatinine, Ser 9.37 (L) 0.76 - 1.27 mg/dL   GFR calc non Af Amer 81 >59  mL/min/1.73   GFR calc Af Amer 94 >59 mL/min/1.73   BUN/Creatinine Ratio 9 (L) 10 - 24   Sodium 139 134 - 144 mmol/L   Potassium 3.9 3.5 - 5.2 mmol/L  Chloride 103 96 - 106 mmol/L   CO2 22 20 - 29 mmol/L   Calcium 9.0 8.6 - 10.2 mg/dL   Total Protein 6.3 6.0 - 8.5 g/dL   Albumin 4.2 3.6 - 4.6 g/dL   Globulin, Total 2.1 1.5 - 4.5 g/dL   Albumin/Globulin Ratio 2.0 1.2 - 2.2   Bilirubin Total 0.7 0.0 - 1.2 mg/dL   Alkaline Phosphatase 56 39 - 117 IU/L   AST 24 0 - 40 IU/L   ALT 19 0 - 44 IU/L  Basic metabolic panel     Status: Abnormal   Collection Time: 03/10/19  9:07 AM  Result Value Ref Range   Glucose 98 65 - 99 mg/dL   BUN 9 8 - 27 mg/dL   Creatinine, Ser 1.00 0.76 - 1.27 mg/dL   GFR calc non Af Amer 66 >59 mL/min/1.73   GFR calc Af Amer 77 >59 mL/min/1.73   BUN/Creatinine Ratio 9 (L) 10 - 24   Sodium 134 134 - 144 mmol/L   Potassium 4.0 3.5 - 5.2 mmol/L   Chloride 98 96 - 106 mmol/L   CO2 23 20 - 29 mmol/L   Calcium 9.0 8.6 - 10.2 mg/dL    Radiology Vas [place] Lower Extremity Venous (dvt)  Result Date: 03/21/2019  Lower Venous Study Performing Technologist: Charlane Ferretti RT (R)(VS)  Examination Guidelines: A complete evaluation includes B-mode imaging, spectral Doppler, color Doppler, and power Doppler as needed of all accessible portions of each vessel. Bilateral testing is considered an integral part of a complete examination. Limited examinations for reoccurring indications may be performed as noted.  +---------+---------------+---------+-----------+----------+--------------+ RIGHT    CompressibilityPhasicitySpontaneityPropertiesThrombus Aging +---------+---------------+---------+-----------+----------+--------------+ CFV      Full                                                        +---------+---------------+---------+-----------+----------+--------------+ SFJ      Full                                                         +---------+---------------+---------+-----------+----------+--------------+ FV Prox  Full                                                        +---------+---------------+---------+-----------+----------+--------------+ FV Mid   Full                                                        +---------+---------------+---------+-----------+----------+--------------+ FV DistalFull                                                        +---------+---------------+---------+-----------+----------+--------------+ PFV      Full                                                        +---------+---------------+---------+-----------+----------+--------------+  POP      Full                                                        +---------+---------------+---------+-----------+----------+--------------+ PTV      Full                                                        +---------+---------------+---------+-----------+----------+--------------+ PERO     Full                                                        +---------+---------------+---------+-----------+----------+--------------+ Gastroc  Full                                                        +---------+---------------+---------+-----------+----------+--------------+ GSV      Full                                                        +---------+---------------+---------+-----------+----------+--------------+ SSV      Full                                                        +---------+---------------+---------+-----------+----------+--------------+ Mild reflux in the right popliteal vein.  Summary: Right: There is no evidence of deep vein thrombosis in the lower extremity.There is no evidence of superficial venous thrombosis.  *See table(s) above for measurements and observations. Electronically signed by Levora Dredge MD on 03/21/2019 at 5:05:34 PM.    Final     Assessment/Plan  Carotid  atherosclerosis Has not been seen in several years and at some point should have a carotid duplex.  His endarterectomy was about 10 years ago.  Hypertension blood pressure control important in reducing the progression of atherosclerotic disease. On appropriate oral medications.   Swelling of limb he underwent a duplex which showed no evidence of DVT or superficial thrombophlebitis.  Some reflux was seen in the right popliteal vein. I suspect he has a significant component of lymphedema from chronic scarring and lymphatic channels.  I prescribed him compression stockings and recommend he wear these daily.  We will see him back in 4 to 6 weeks and follow-up to see his response to conservative therapy and determine whether or not a lymphedema pump would be a good adjuvant therapy.    Festus Barren, MD  03/25/2019 11:59 AM    This note was created with Dragon medical transcription system.  Any errors from dictation are purely unintentional

## 2019-03-25 NOTE — Assessment & Plan Note (Signed)
Has not been seen in several years and at some point should have a carotid duplex.  His endarterectomy was about 10 years ago.

## 2019-03-25 NOTE — Patient Instructions (Signed)

## 2019-03-25 NOTE — Assessment & Plan Note (Signed)
blood pressure control important in reducing the progression of atherosclerotic disease. On appropriate oral medications.  

## 2019-04-13 ENCOUNTER — Telehealth: Payer: Self-pay

## 2019-04-13 ENCOUNTER — Ambulatory Visit: Payer: Self-pay | Admitting: *Deleted

## 2019-04-13 DIAGNOSIS — M7989 Other specified soft tissue disorders: Secondary | ICD-10-CM

## 2019-04-13 NOTE — Chronic Care Management (AMB) (Signed)
  Chronic Care Management   Outreach Note  04/13/2019 Name: Nicholas Gross MRN: [id number] DOB: December 17, 1927  Referred by: [name], DO Reason for referral : Chronic Care Management (Unsuccessful outreah x1 )   An unsuccessful telephone outreach was attempted today. The patient was referred to the case management team by for assistance with care management and care coordination.  Call placed to patient and neighbor.   Follow Up Plan: A HIPPA compliant phone message was left for the patient providing contact information and requesting a return call.   [name] Chloe Miyoshi RN, BSN Nurse Case Editor, [facility]  [phone]) Business Mobile

## 2019-05-06 ENCOUNTER — Ambulatory Visit (INDEPENDENT_AMBULATORY_CARE_PROVIDER_SITE_OTHER): Payer: Medicare Other | Admitting: Vascular Surgery

## 2019-05-06 ENCOUNTER — Other Ambulatory Visit: Payer: Self-pay

## 2019-05-06 ENCOUNTER — Encounter (INDEPENDENT_AMBULATORY_CARE_PROVIDER_SITE_OTHER): Payer: Self-pay | Admitting: Vascular Surgery

## 2019-05-06 VITALS — BP 200/83 | HR 55 | Resp 16 | Wt 175.0 lb

## 2019-05-06 DIAGNOSIS — I890 Lymphedema, not elsewhere classified: Secondary | ICD-10-CM | POA: Insufficient documentation

## 2019-05-06 DIAGNOSIS — R600 Localized edema: Secondary | ICD-10-CM

## 2019-05-06 DIAGNOSIS — I10 Essential (primary) hypertension: Secondary | ICD-10-CM | POA: Diagnosis not present

## 2019-05-06 DIAGNOSIS — M7989 Other specified soft tissue disorders: Secondary | ICD-10-CM

## 2019-05-06 NOTE — Patient Instructions (Signed)

## 2019-05-06 NOTE — Assessment & Plan Note (Signed)
Symptoms not a lot improved with conservative measures.  Would recommend adding a lymphedema pump at this point.

## 2019-05-06 NOTE — Assessment & Plan Note (Signed)
blood pressure control important in reducing the progression of atherosclerotic disease. On appropriate oral medications.  

## 2019-05-06 NOTE — Progress Notes (Signed)
MRN : [id number]  Nicholas Gross is a 85 y.o. (08-Sep-1927) male who presents with chief complaint of  Chief Complaint  Patient presents with  . Follow-up    6week follow up  .  History of Present Illness: Patient returns today in follow up of his leg swelling.  Compression stockings did little to improve his right leg swelling and he says they actually hurt more than they help.  He has been trying to elevate them as much as possible as well as walk more and increase his activity.  This has resulted in minimal improvement in his right leg which remains fairly swollen and significant stasis dermatitis changes are present in the right lower leg.  The skin is very dry and scaly.  No open wounds or infections currently.  No fevers or chills.  Current Outpatient Medications  Medication Sig Dispense Refill  . aspirin EC 81 MG tablet Take 1 tablet (81 mg total) by mouth daily. 100 tablet 12  . furosemide (LASIX) 20 MG tablet Take 1 tablet (20 mg total) by mouth daily. 30 tablet 3  . Multiple Vitamins-Minerals (PRESERVISION AREDS PO) Take by mouth 2 (two) times daily.    . rosuvastatin (CRESTOR) 10 MG tablet Take 1 tablet (10 mg total) by mouth daily. 90 tablet 1   No current facility-administered medications for this visit.     Past Medical History:  Diagnosis Date  . CAD (coronary artery disease)   . History of GI bleed 2008  . History of tobacco use    17 pack years, quit around 1970  . Hyperlipidemia   . Hypertension   . Overweight     Past Surgical History:  Procedure Laterality Date  . CAROTID ENDARTERECTOMY Left 2010  . CATARACT EXTRACTION    . cypher stent  09/12/02   s/p cypher stent mid-LAD  . TENDON REPAIR  1991   finger and arm     Social History   Tobacco Use  . Smoking status: Former Smoker    Packs/day: 1.00    Years: 17.00    Pack years: 17.00    Types: Cigarettes    Quit date: 06/23/1968    Years since quitting: 50.9  . Smokeless tobacco: Never Used   Substance Use Topics  . Alcohol use: No  . Drug use: No    Family History  Problem Relation Age of Onset  . Heart disease Father        possibly  . Cancer Sister        breast  . AAA (abdominal aortic aneurysm) Brother   . Cancer Sister        lung     Allergies  Allergen Reactions  . Other     Patient states that he is allergic to something that they place in the IV before they work on you  . Simvastatin Other (See Comments)    Myalgia      REVIEW OF SYSTEMS (Negative unless checked)  Constitutional: [] Weight loss  [] Fever  [] Chills Cardiac: [] Chest pain   [] Chest pressure   [] Palpitations   [] Shortness of breath when laying flat   [] Shortness of breath at rest   [] Shortness of breath with exertion. Vascular:  [] Pain in legs with walking   [] Pain in legs at rest   [] Pain in legs when laying flat   [] Claudication   [] Pain in feet when walking  [] Pain in feet at rest  [] Pain in feet when laying flat   [] History  of DVT   [] Phlebitis   [] Swelling in legs   [] Varicose veins   [] Non-healing ulcers Pulmonary:   [] Uses home oxygen   [] Productive cough   [] Hemoptysis   [] Wheeze  [] COPD   [] Asthma Neurologic:  [] Dizziness  [] Blackouts   [] Seizures   [] History of stroke   [] History of TIA  [] Aphasia   [] Temporary blindness   [] Dysphagia   [] Weakness or numbness in arms   [] Weakness or numbness in legs Musculoskeletal:  [] Arthritis   [] Joint swelling   [] Joint pain   [] Low back pain Hematologic:  [] Easy bruising  [] Easy bleeding   [] Hypercoagulable state   [] Anemic   Gastrointestinal:  [] Blood in stool   [] Vomiting blood  [] Gastroesophageal reflux/heartburn   [] Abdominal pain Genitourinary:  [] Chronic kidney disease   [] Difficult urination  [] Frequent urination  [] Burning with urination   [] Hematuria Skin:  [] Rashes   [] Ulcers   [] Wounds Psychological:  [] History of anxiety   []  History of major depression.  Physical Examination  BP (!) 200/83 (BP Location: Left Arm)   Pulse (!) 55    Resp 16   Wt 175 lb (79.4 kg)   BMI 25.33 kg/m  Gen:  WD/WN, NAD. Appears younger than stated age. Head: [place]/AT, No temporalis wasting. Ear/Nose/Throat: Hearing grossly intact, nares w/o erythema or drainage Eyes: Conjunctiva clear. Sclera non-icteric Neck: Supple.  Trachea midline Pulmonary:  Good air movement, no use of accessory muscles.  Cardiac: RRR, no JVD Vascular:  Vessel Right Left  Radial Palpable Palpable                          PT Trace Palpable 1+ Palpable  DP 1+ Palpable 2+ Palpable    Musculoskeletal: M/S 5/5 throughout.  No deformity or atrophy. 1-2+ RLE edema. Neurologic: Sensation grossly intact in extremities.  Symmetrical.  Speech is fluent.  Psychiatric: Judgment intact, Mood & affect appropriate for pt's clinical situation. Dermatologic: No rashes or ulcers noted.  No cellulitis or open wounds.       Labs Recent Results (from the past 2160 hour(s))  Lipid Panel w/o Chol/HDL Ratio     Status: Abnormal   Collection Time: 03/03/19  9:25 AM  Result Value Ref Range   Cholesterol, Total 99 (L) 100 - 199 mg/dL   Triglycerides 74 0 - 149 mg/dL   HDL 47 >16>39 mg/dL   VLDL Cholesterol Cal 16 5 - 40 mg/dL   LDL Chol Calc (NIH) 36 0 - 99 mg/dL  Comprehensive metabolic panel     Status: Abnormal   Collection Time: 03/03/19  9:25 AM  Result Value Ref Range   Glucose 88 65 - 99 mg/dL   BUN 7 (L) 8 - 27 mg/dL   Creatinine, Ser 1.090.75 (L) 0.76 - 1.27 mg/dL   GFR calc non Af Amer 81 >59 mL/min/1.73   GFR calc Af Amer 94 >59 mL/min/1.73   BUN/Creatinine Ratio 9 (L) 10 - 24   Sodium 139 134 - 144 mmol/L   Potassium 3.9 3.5 - 5.2 mmol/L   Chloride 103 96 - 106 mmol/L   CO2 22 20 - 29 mmol/L   Calcium 9.0 8.6 - 10.2 mg/dL   Total Protein 6.3 6.0 - 8.5 g/dL   Albumin 4.2 3.6 - 4.6 g/dL   Globulin, Total 2.1 1.5 - 4.5 g/dL   Albumin/Globulin Ratio 2.0 1.2 - 2.2   Bilirubin Total 0.7 0.0 - 1.2 mg/dL   Alkaline Phosphatase 56 39 - 117 IU/L  AST 24 0 - 40  IU/L   ALT 19 0 - 44 IU/L  Basic metabolic panel     Status: Abnormal   Collection Time: 03/10/19  9:07 AM  Result Value Ref Range   Glucose 98 65 - 99 mg/dL   BUN 9 8 - 27 mg/dL   Creatinine, Ser 1.00 0.76 - 1.27 mg/dL   GFR calc non Af Amer 66 >59 mL/min/1.73   GFR calc Af Amer 77 >59 mL/min/1.73   BUN/Creatinine Ratio 9 (L) 10 - 24   Sodium 134 134 - 144 mmol/L   Potassium 4.0 3.5 - 5.2 mmol/L   Chloride 98 96 - 106 mmol/L   CO2 23 20 - 29 mmol/L   Calcium 9.0 8.6 - 10.2 mg/dL    Radiology No results found.  Assessment/Plan  Hypertension blood pressure control important in reducing the progression of atherosclerotic disease. On appropriate oral medications.   Swelling of limb Symptoms not a lot improved with conservative measures.  Would recommend adding a lymphedema pump at this point.  Lymphedema At this point, the patient would have stage II lymphedema with tissue changes present including edema refractory to elevation, dermal thickening and some scaling skin.  The addition of a lymphedema pump would be an excellent adjuvant therapy at this point.  We will try to obtain this and see the patient back in several months to assess his response to conservative therapy with the addition of a lymphedema pump.    Leotis Pain, MD  05/06/2019 11:03 AM    This note was created with Dragon medical transcription system.  Any errors from dictation are purely unintentional

## 2019-05-06 NOTE — Assessment & Plan Note (Signed)
At this point, the patient would have stage II lymphedema with tissue changes present including edema refractory to elevation, dermal thickening and some scaling skin.  The addition of a lymphedema pump would be an excellent adjuvant therapy at this point.  We will try to obtain this and see the patient back in several months to assess his response to conservative therapy with the addition of a lymphedema pump.

## 2019-05-16 ENCOUNTER — Telehealth (INDEPENDENT_AMBULATORY_CARE_PROVIDER_SITE_OTHER): Payer: Self-pay

## 2019-05-16 NOTE — Telephone Encounter (Signed)
  This was received via email from [place] [facility] [place]  Patient MRN [id number] Nicholas Gross all is well. I wanted to let you know that we saw this patient this morning at his home regarding his pump. He stated he did not need nor want the pump. Just wanted to make you aware. Thank you.   [place]

## 2019-05-18 ENCOUNTER — Other Ambulatory Visit: Payer: Self-pay

## 2019-05-18 DIAGNOSIS — Z20822 Contact with and (suspected) exposure to covid-19: Secondary | ICD-10-CM

## 2019-05-20 LAB — NOVEL CORONAVIRUS, NAA: SARS-CoV-2, NAA: NOT DETECTED

## 2019-05-22 ENCOUNTER — Telehealth: Payer: Self-pay | Admitting: Family Medicine

## 2019-05-22 NOTE — Telephone Encounter (Signed)
Covid results given, negative 

## 2019-05-24 ENCOUNTER — Telehealth: Payer: Self-pay

## 2019-06-09 ENCOUNTER — Other Ambulatory Visit: Payer: Self-pay | Admitting: Family Medicine

## 2019-07-12 ENCOUNTER — Telehealth: Payer: Self-pay

## 2019-08-09 ENCOUNTER — Ambulatory Visit (INDEPENDENT_AMBULATORY_CARE_PROVIDER_SITE_OTHER): Payer: Medicare Other | Admitting: General Practice

## 2019-08-09 ENCOUNTER — Telehealth: Payer: Self-pay | Admitting: General Practice

## 2019-08-09 DIAGNOSIS — I2510 Atherosclerotic heart disease of native coronary artery without angina pectoris: Secondary | ICD-10-CM | POA: Diagnosis not present

## 2019-08-09 DIAGNOSIS — E782 Mixed hyperlipidemia: Secondary | ICD-10-CM | POA: Diagnosis not present

## 2019-08-09 DIAGNOSIS — M7989 Other specified soft tissue disorders: Secondary | ICD-10-CM

## 2019-08-09 DIAGNOSIS — I890 Lymphedema, not elsewhere classified: Secondary | ICD-10-CM

## 2019-08-09 DIAGNOSIS — I10 Essential (primary) hypertension: Secondary | ICD-10-CM | POA: Diagnosis not present

## 2019-08-09 NOTE — Patient Instructions (Signed)
Visit Information  Goals Addressed            This Visit's Progress    RN-Swelling is about the same but I am doing good (pt-stated)       Current Barriers:   Knowledge Deficits related to  swelling of RLE   Lacks caregiver support.   Transportation barriers  Low-vision/unable to write appts down related to Macular degeneration  Nurse Case Manager Clinical Goal(s):   Over the next 90 days, patient will work with [facility] to address needs related to needed appointments for follow up of Chronic health conditions including swelling of his right leg  Interventions:   Evaluation of current treatment plan related to RLE swelling and patient's adherence to plan as established by provider.  Reviewed medications with patient and discussed need to continue fluid medication daily-the patient states he no longer takes the Lasix. Denies any issues related to the swelling  Collaborated with PCP  regarding patient's needs for follow up. PCP recommends f/u with Dr. Wyn Quaker and a 3 month f/u with PCP- message to front office staff to get an appointment for the patient to come in to be seen  Discussed plans with patient for ongoing care management follow up and provided patient with direct contact information for care management team  Requested front office clinical staff make an appt for patient to come in to be seen, last visit in September of 2020  Patient Self Care Activities:   Currently UNABLE TO independently write down appts or transport self to appts related to low vision.   Please see past updates related to this goal by clicking on the "Past Updates" button in the selected goal       RNCM: I have a rash pretty much all over       Current Barriers:   Knowledge Deficits related to cause of the rash he has had "for a while" and what to do for it  Lacks caregiver support.   Transportation barriers  Vision issues- low vision due to macular degeneration   Nurse Case Manager Clinical  Goal(s):   Over the next 90 days, patient will work with [facility] and pcp to address needs related to rash the patient has had for some time now  Over the next 90 days, patient will attend all scheduled medical appointments: front office staff to call and get an appointment for the patient to see the pcp  Over the next 90 days, patient will demonstrate improved adherence to prescribed treatment plan for rash as evidenced byfollow up with pcp and recommendations for treatment of the rash  Interventions:   Evaluation of current treatment plan related to rash and patient's adherence to plan as established by provider.  Provided education to patient re: related to coming into the office to be seen concerning the rash and need for follow up with pcp  Reviewed medications with patient and discussed compliance- the patient verbalized he is no longer taking the Lasix  Collaborated with front office staff regarding securing an appointment for the patient to come in to be evaluated by pcp for rash and elevated blood pressures  Patient Self Care Activities:   Patient verbalizes understanding of plan to have the office staff call and set up an appointment to see pcp concerning rash and elevated blood pressures  Attends all scheduled provider appointments  Calls provider office for new concerns or questions  Unable to independently manage care related to rash the patient has had for a while  and elevated blood pressures  Lacks social connections  Initial goal documentation      RNCM: I have cut back on my salt intake       Current Barriers:   Chronic Disease Management support, education, and care coordination needs related to CAD, HTN, HLD, and Lymphedema   Clinical Goal(s) related to CAD, HTN, HLD, and Lymphedema :  Over the next 90 days, patient will:   Work with the care management team to address educational, disease management, and care coordination needs   Begin or continue self  health monitoring activities as directed today Measure and record blood pressure 5 times per week  Call provider office for new or worsened signs and symptoms Blood pressure findings outside established parameters and New or worsened symptom related to chronic health conditions  Call care management team with questions or concerns  Verbalize basic understanding of patient centered plan of care established today  Interventions related to CAD, HTN, HLD, and Lymphedema  :   Evaluation of current treatment plans and patient's adherence to plan as established by provider  Discussed [place] healthy diet- the patient is cutting back on salt.  Advised the patient to rinse canned vegetables and try to stay away from processed foods. The patient cooks for himself and sometimes eats food that his neighbor prepares for him.  Assessed patient understanding of disease states  Assessed patient's education and care coordination needs  Provided disease specific education to patient   Collaborated with appropriate clinical care team members regarding patient needs  Contacted the front desk to secure an appointment for the patient to come in and be evaluated by the pcp for rash and elevated  BP  Patient Self Care Activities related to CAD, HTN, HLD, and Lymphedema :   Patient is unable to independently self-manage chronic health conditions  Initial goal documentation        The patient verbalized understanding of instructions provided today and declined a print copy of patient instruction materials.   The care management team will reach out to the patient again over the next 30 to 60 days.    Noreene Larsson RN, MSN, [facility] Mobile: [phone]

## 2019-08-09 NOTE — Chronic Care Management (AMB) (Signed)
Chronic Care Management   Follow Up Note   08/09/2019 Name: Nicholas Gross MRN: [id number] DOB: 05/28/1928  Referred by: [name], DO Reason for referral : Chronic Care Management (F/U Right leg swelling/HTN/HLD/ Lymphedema )   Nicholas Gross is a 86 y.o. year old male who is a primary care patient of [name], DO. The CCM team was consulted for assistance with chronic disease management and care coordination needs.    Review of patient status, including review of consultants reports, relevant laboratory and other test results, and collaboration with appropriate care team members and the patient's provider was performed as part of comprehensive patient evaluation and provision of chronic care management services.    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 See Care Plan for related entries.   Outpatient Encounter Medications as of 08/09/2019  Medication Sig   aspirin EC 81 MG tablet Take 1 tablet (81 mg total) by mouth daily.   Multiple Vitamins-Minerals (PRESERVISION AREDS PO) Take by mouth 2 (two) times daily.   rosuvastatin (CRESTOR) 10 MG tablet TAKE 1 TABLET BY MOUTH ONCE DAILY   furosemide (LASIX) 20 MG tablet Take 1 tablet (20 mg total) by mouth daily. (Patient not taking: Reported on 08/09/2019)   No facility-administered encounter medications on file as of 08/09/2019.     Objective:  BP Readings from Last 3 Encounters:  08/05/19 (!) 145/75  05/06/19 (!) 200/83  03/25/19 (!) 163/73    Goals Addressed            This Visit's Progress    RN-Swelling is about the same but I am doing good (pt-stated)       Current Barriers:   Knowledge Deficits related to  swelling of RLE   Lacks caregiver support.   Transportation barriers  Low-vision/unable to write appts down related to Macular degeneration  Nurse Case  Manager Clinical Goal(s):   Over the next 90 days, patient will work with [facility] to address needs related to needed appointments for follow up of Chronic health conditions including swelling of his right leg  Interventions:   Evaluation of current treatment plan related to RLE swelling and patient's adherence to plan as established by provider.  Reviewed medications with patient and discussed need to continue fluid medication daily-the patient states he no longer takes the Lasix. Denies any issues related to the swelling  Collaborated with PCP  regarding patient's needs for follow up. PCP recommends f/u with [name] and a 3 month f/u with PCP- message to front office staff to get an appointment for the patient to come in to be seen  Discussed plans with patient for ongoing care management follow up and provided patient with direct contact information for care management team  Requested front office clinical staff make an appt for patient to come in to be seen, last visit in September of 2020  Patient Self Care Activities:   Currently UNABLE TO independently write down appts or transport self to appts related to low vision.   Please see past updates related to this goal by clicking on the "Past Updates" button in the selected goal       RNCM: I have a rash pretty much all over       Current Barriers:   Knowledge Deficits related to cause of the rash he has had "for a while" and what to do for it  Lacks caregiver support.   Transportation barriers  Vision issues- low vision due to macular degeneration   Nurse Case Manager Clinical Goal(s):   Over the next 90 days, patient will work with [name] and pcp to address needs related to rash the patient has had for some time now  Over the next 90 days, patient will attend all scheduled medical appointments: front office staff to call and get an appointment for the patient to see the pcp  Over the next 90 days, patient will demonstrate  improved adherence to prescribed treatment plan for rash as evidenced byfollow up with pcp and recommendations for treatment of the rash  Interventions:   Evaluation of current treatment plan related to rash and patient's adherence to plan as established by provider.  Provided education to patient re: related to coming into the office to be seen concerning the rash and need for follow up with pcp  Reviewed medications with patient and discussed compliance- the patient verbalized he is no longer taking the Lasix  Collaborated with front office staff regarding securing an appointment for the patient to come in to be evaluated by pcp for rash and elevated blood pressures  Patient Self Care Activities:   Patient verbalizes understanding of plan to have the office staff call and set up an appointment to see pcp concerning rash and elevated blood pressures  Attends all scheduled provider appointments  Calls provider office for new concerns or questions  Unable to independently manage care related to rash the patient has had for a while and elevated blood pressures  Lacks social connections  Initial goal documentation      RNCM: I have cut back on my salt intake       Current Barriers:   Chronic Disease Management support, education, and care coordination needs related to CAD, HTN, HLD, and Lymphedema   Clinical Goal(s) related to CAD, HTN, HLD, and Lymphedema :  Over the next 90 days, patient will:   Work with the care management team to address educational, disease management, and care coordination needs   Begin or continue self health monitoring activities as directed today Measure and record blood pressure 5 times per week  Call provider office for new or worsened signs and symptoms Blood pressure findings outside established parameters and New or worsened symptom related to chronic health conditions  Call care management team with questions or concerns  Verbalize basic  understanding of patient centered plan of care established today  Interventions related to CAD, HTN, HLD, and Lymphedema  :   Evaluation of current treatment plans and patient's adherence to plan as established by provider  Discussed [place] healthy diet- the patient is cutting back on salt.  Advised the patient to rinse canned vegetables and try to stay away from processed foods. The patient cooks for himself and sometimes eats food that his neighbor prepares for him.  Assessed patient understanding of disease states  Assessed patient's education and care coordination needs  Provided disease specific education to patient   Collaborated with appropriate clinical care team members regarding patient needs  Contacted the front desk to secure an appointment for the patient to come in and be evaluated by the pcp for rash and elevated  BP  Patient Self Care Activities related to CAD, HTN, HLD, and Lymphedema :   Patient is unable to independently self-manage chronic health conditions  Initial goal documentation         Plan:   The care management team will reach  out to the patient again over the next 30 to 60 days.    [name] RN, MSN, CCM Community Care Coordinator [place]   [facility] Mobile: [phone]

## 2019-09-30 ENCOUNTER — Ambulatory Visit (INDEPENDENT_AMBULATORY_CARE_PROVIDER_SITE_OTHER): Payer: Medicare Other | Admitting: General Practice

## 2019-09-30 ENCOUNTER — Telehealth: Payer: Self-pay | Admitting: General Practice

## 2019-09-30 DIAGNOSIS — I2510 Atherosclerotic heart disease of native coronary artery without angina pectoris: Secondary | ICD-10-CM

## 2019-09-30 DIAGNOSIS — R21 Rash and other nonspecific skin eruption: Secondary | ICD-10-CM

## 2019-09-30 DIAGNOSIS — I10 Essential (primary) hypertension: Secondary | ICD-10-CM

## 2019-09-30 DIAGNOSIS — E782 Mixed hyperlipidemia: Secondary | ICD-10-CM

## 2019-09-30 NOTE — Chronic Care Management (AMB) (Signed)
Chronic Care Management   Follow Up Note   09/30/2019 Name: Nicholas Gross MRN: [id number] DOB: 16-Jun-1928  Referred by: Dorcas Carrow, DO Reason for referral : Chronic Care Management (Follow up HTN/HLD/CAD- follow up on rash/Low sodium diet)   Nicholas Gross is a 86 y.o. year old male who is a primary care patient of Dorcas Carrow, DO. The CCM team was consulted for assistance with chronic disease management and care coordination needs.    Review of patient status, including review of consultants reports, relevant laboratory and other test results, and collaboration with appropriate care team members and the patient's provider was performed as part of comprehensive patient evaluation and provision of chronic care management services.    SDOH (Social Determinants of Health) assessments performed: Yes- transportation- the patient does not drive due to his vision; however he has neighbors that help him See Care Plan activities for detailed interventions related to [name])     Outpatient Encounter Medications as of 09/30/2019  Medication Sig  . aspirin EC 81 MG tablet Take 1 tablet (81 mg total) by mouth daily.  . furosemide (LASIX) 20 MG tablet Take 1 tablet (20 mg total) by mouth daily. (Patient not taking: Reported on 08/09/2019)  . Multiple Vitamins-Minerals (PRESERVISION AREDS PO) Take by mouth 2 (two) times daily.  . rosuvastatin (CRESTOR) 10 MG tablet TAKE 1 TABLET BY MOUTH ONCE DAILY   No facility-administered encounter medications on file as of 09/30/2019.     Objective:  BP Readings from Last 3 Encounters:  09/29/19 133/63  08/05/19 (!) 145/75  05/06/19 (!) 200/83    Goals Addressed            This Visit's Progress   . COMPLETED: RN-Swelling is about the same but I am doing good (pt-stated)       Current Barriers:  [name] Knowledge Deficits related to  swelling of RLE  . Lacks caregiver support.  . Transportation barriers . Low-vision/unable to write appts down  related to Macular degeneration  Nurse Case Manager Clinical Goal(s):  [name] Over the next 90 days, patient will work with [name] to address needs related to needed appointments for follow up of Chronic health conditions including swelling of his right leg  Interventions:  . Evaluation of current treatment plan related to RLE swelling and patient's adherence to plan as established by provider. . Reviewed medications with patient and discussed need to continue fluid medication daily-the patient states he no longer takes the Lasix. Denies any issues related to the swelling . Collaborated with PCP  regarding patient's needs for follow up. PCP recommends f/u with Dr. Wyn Quaker and a 3 month f/u with PCP- message to front office staff to get an appointment for the patient to come in to be seen . Discussed plans with patient for ongoing care management follow up and provided patient with direct contact information for care management team . Requested front office clinical staff make an appt for patient to come in to be seen, last visit in September of 2020  Patient Self Care Activities:  . Currently UNABLE TO independently write down appts or transport self to appts related to low vision.   Please see past updates related to this goal by clicking on the "Past Updates" button in the selected goal      . RNCM: I have a rash pretty much all over       Current Barriers:  [name] Knowledge Deficits related to cause of the rash he  has had "for a while" and what to do for it . Lacks caregiver support.  . Transportation barriers . Vision issues- low vision due to macular degeneration   Nurse Case Manager Clinical Goal(s):  [name] Over the next 90 days, patient will work with [name] and pcp to address needs related to rash the patient has had for some time now . Over the next 90 days, patient will attend all scheduled medical appointments: front office staff to call and get an appointment for the patient to see the pcp- reminder  sent today 09/30/2019 . Over the next 90 days, patient will demonstrate improved adherence to prescribed treatment plan for rash as evidenced byfollow up with pcp and recommendations for treatment of the rash- per the patient the rash has gone and the patient is only having itching "every now and then"  Interventions:  . Evaluation of current treatment plan related to rash and patient's adherence to plan as established by provider. . Provided education to patient re: related to coming into the office to be seen concerning the rash and need for follow up with pcp.  Per the patient he feels the rash came about because of the Covid 19 vaccination. He had the second one on 2/11.  States the rash is "almost" completely gone now. He does experience itching at times but it is not often at all.  . Reviewed medications with patient and discussed compliance- the patient verbalized he is no longer taking the Lasix . Collaborated with front office staff regarding securing an appointment for the patient to come in to be evaluated by pcp for post rash and elevated blood pressures.  Blood pressures have stabilized.   Patient Self Care Activities:  . Patient verbalizes understanding of plan to have the office staff call and set up an appointment to see pcp concerning post rash and period of elevated blood pressures . Attends all scheduled provider appointments . Calls provider office for new concerns or questions . Unable to independently manage care related to rash the patient has had for a while and elevated blood pressures . Lacks social connections  Please see past updates related to this goal by clicking on the "Past Updates" button in the selected goal      . RNCM: I have cut back on my salt intake       Current Barriers:  . Chronic Disease Management support, education, and care coordination needs related to CAD, HTN, HLD, and Lymphedema   Clinical Goal(s) related to CAD, HTN, HLD, and Lymphedema :  Over  the next 90 days, patient will:  . Work with the care management team to address educational, disease management, and care coordination needs  . Begin or continue self health monitoring activities as directed today Measure and record blood pressure 5 times per week . Call provider office for new or worsened signs and symptoms Blood pressure findings outside established parameters and New or worsened symptom related to chronic health conditions . Call care management team with questions or concerns . Verbalize basic understanding of patient centered plan of care established today  Interventions related to CAD, HTN, HLD, and Lymphedema  :  . Evaluation of current treatment plans and patient's adherence to plan as established by provider . Discussed [place] healthy diet- the patient is cutting back on salt.  Advised the patient to rinse canned vegetables and try to stay away from processed foods. The patient cooks for himself and sometimes eats food that his neighbor prepares for  him. The patient verbalized that he is doing well with watching his sodium intake and has lost weight because he has noted that his pants are "too big".  He has a scales but does not feel they are accurate because they are very old.  . Assessed patient understanding of disease states . Assessed patient's education and care coordination needs.  The patient has not seen the pcp since September of 2020.  Agrees to have office staff come call and set up an appointment  . Provided disease specific education to patient. Praised for watching sodium intake in diet and improved blood pressures. Education and support . Collaborated with appropriate clinical care team members regarding patient needs . Contacted the front desk to secure an appointment for the patient to come in and be evaluated by the pcp for post rash and elevated  BP  Patient Self Care Activities related to CAD, HTN, HLD, and Lymphedema :  . Patient is unable to  independently self-manage chronic health conditions  Please see past updates related to this goal by clicking on the "Past Updates" button in the selected goal          Plan:   The care management team will reach out to the patient again over the next 60 to 90 days.    [name] RN, MSN, CCM Community Care Coordinator [place]  [facility] Mobile: [phone]

## 2019-09-30 NOTE — Patient Instructions (Signed)
Visit Information  Goals Addressed            This Visit's Progress    COMPLETED: RN-Swelling is about the same but I am doing good (pt-stated)       Current Barriers:   Knowledge Deficits related to  swelling of RLE   Lacks caregiver support.   Transportation barriers  Low-vision/unable to write appts down related to Macular degeneration  Nurse Case Manager Clinical Goal(s):   Over the next 90 days, patient will work with [facility] to address needs related to needed appointments for follow up of Chronic health conditions including swelling of his right leg  Interventions:   Evaluation of current treatment plan related to RLE swelling and patient's adherence to plan as established by provider.  Reviewed medications with patient and discussed need to continue fluid medication daily-the patient states he no longer takes the Lasix. Denies any issues related to the swelling  Collaborated with PCP  regarding patient's needs for follow up. PCP recommends f/u with Dr. Wyn Quaker and a 3 month f/u with PCP- message to front office staff to get an appointment for the patient to come in to be seen  Discussed plans with patient for ongoing care management follow up and provided patient with direct contact information for care management team  Requested front office clinical staff make an appt for patient to come in to be seen, last visit in September of 2020  Patient Self Care Activities:   Currently UNABLE TO independently write down appts or transport self to appts related to low vision.   Please see past updates related to this goal by clicking on the "Past Updates" button in the selected goal       RNCM: I have a rash pretty much all over       Current Barriers:   Knowledge Deficits related to cause of the rash he has had "for a while" and what to do for it  Lacks caregiver support.   Transportation barriers  Vision issues- low vision due to macular degeneration   Nurse Case Manager  Clinical Goal(s):   Over the next 90 days, patient will work with [facility] and pcp to address needs related to rash the patient has had for some time now  Over the next 90 days, patient will attend all scheduled medical appointments: front office staff to call and get an appointment for the patient to see the pcp- reminder sent today 09/30/2019  Over the next 90 days, patient will demonstrate improved adherence to prescribed treatment plan for rash as evidenced byfollow up with pcp and recommendations for treatment of the rash- per the patient the rash has gone and the patient is only having itching "every now and then"  Interventions:   Evaluation of current treatment plan related to rash and patient's adherence to plan as established by provider.  Provided education to patient re: related to coming into the office to be seen concerning the rash and need for follow up with pcp.  Per the patient he feels the rash came about because of the Covid 19 vaccination. He had the second one on 2/11.  States the rash is "almost" completely gone now. He does experience itching at times but it is not often at all.   Reviewed medications with patient and discussed compliance- the patient verbalized he is no longer taking the Lasix  Collaborated with front office staff regarding securing an appointment for the patient to come in to be evaluated by pcp  for post rash and elevated blood pressures.  Blood pressures have stabilized.   Patient Self Care Activities:   Patient verbalizes understanding of plan to have the office staff call and set up an appointment to see pcp concerning post rash and period of elevated blood pressures  Attends all scheduled provider appointments  Calls provider office for new concerns or questions  Unable to independently manage care related to rash the patient has had for a while and elevated blood pressures  Lacks social connections  Please see past updates related to this goal  by clicking on the "Past Updates" button in the selected goal       RNCM: I have cut back on my salt intake       Current Barriers:   Chronic Disease Management support, education, and care coordination needs related to CAD, HTN, HLD, and Lymphedema   Clinical Goal(s) related to CAD, HTN, HLD, and Lymphedema :  Over the next 90 days, patient will:   Work with the care management team to address educational, disease management, and care coordination needs   Begin or continue self health monitoring activities as directed today Measure and record blood pressure 5 times per week  Call provider office for new or worsened signs and symptoms Blood pressure findings outside established parameters and New or worsened symptom related to chronic health conditions  Call care management team with questions or concerns  Verbalize basic understanding of patient centered plan of care established today  Interventions related to CAD, HTN, HLD, and Lymphedema  :   Evaluation of current treatment plans and patient's adherence to plan as established by provider  Discussed [place] healthy diet- the patient is cutting back on salt.  Advised the patient to rinse canned vegetables and try to stay away from processed foods. The patient cooks for himself and sometimes eats food that his neighbor prepares for him. The patient verbalized that he is doing well with watching his sodium intake and has lost weight because he has noted that his pants are "too big".  He has a scales but does not feel they are accurate because they are very old.   Assessed patient understanding of disease states  Assessed patient's education and care coordination needs.  The patient has not seen the pcp since September of 2020.  Agrees to have office staff come call and set up an appointment   Provided disease specific education to patient. Praised for watching sodium intake in diet and improved blood pressures. Education and  support  Collaborated with appropriate clinical care team members regarding patient needs  Contacted the front desk to secure an appointment for the patient to come in and be evaluated by the pcp for post rash and elevated  BP  Patient Self Care Activities related to CAD, HTN, HLD, and Lymphedema :   Patient is unable to independently self-manage chronic health conditions  Please see past updates related to this goal by clicking on the "Past Updates" button in the selected goal         Patient verbalizes understanding of instructions provided today.   The care management team will reach out to the patient again over the next 60 to 90 days.   Noreene Larsson RN, MSN, [facility] Mobile: [phone]

## 2019-10-03 ENCOUNTER — Telehealth: Payer: Self-pay | Admitting: Family Medicine

## 2019-10-03 NOTE — Telephone Encounter (Signed)
-----   Message from Marlowe Sax sent at 09/30/2019  9:14 AM EDT ----- Claudie Revering patient has not seen [name] since Sept of 2020.  Could you please call him and set up an appointment? Nothing urgent but he has experienced a rash that is almost gone and some blood pressure elevations.Thanks,Pam

## 2019-10-03 NOTE — Telephone Encounter (Signed)
Pt has apt on 11/03/2019.

## 2019-10-19 ENCOUNTER — Ambulatory Visit (INDEPENDENT_AMBULATORY_CARE_PROVIDER_SITE_OTHER): Payer: Medicare Other

## 2019-10-19 DIAGNOSIS — Z0000 Encounter for general adult medical examination without abnormal findings: Secondary | ICD-10-CM | POA: Diagnosis not present

## 2019-10-19 NOTE — Patient Instructions (Signed)
Nicholas Gross , Thank you for taking time to come for your Medicare Wellness Visit. I appreciate your ongoing commitment to your health goals. Please review the following plan we discussed and let me know if I can assist you in the future.   Screening recommendations/referrals: Colonoscopy: no longer required  Recommended yearly ophthalmology/optometry visit for glaucoma screening and checkup Recommended yearly dental visit for hygiene and checkup  Vaccinations: Influenza vaccine: due 02/2020 Pneumococcal vaccine: up to date, eligible for booster  Tdap vaccine: due now  Shingles vaccine: shingrix eligible    Covid-19: completed   Advanced directives: Advance directive discussed with you today.    Conditions/risks identified: none   Next appointment: Follow up in one year for your annual wellness visit   Preventive Care 65 Years and Older, Male Preventive care refers to lifestyle choices and visits with your health care provider that can promote health and wellness. What does preventive care include?  A yearly physical exam. This is also called an annual well check.  Dental exams once or twice a year.  Routine eye exams. Ask your health care provider how often you should have your eyes checked.  Personal lifestyle choices, including:  Daily care of your teeth and gums.  Regular physical activity.  Eating a healthy diet.  Avoiding tobacco and drug use.  Limiting alcohol use.  Practicing safe sex.  Taking low doses of aspirin every day.  Taking vitamin and mineral supplements as recommended by your health care provider. What happens during an annual well check? The services and screenings done by your health care provider during your annual well check will depend on your age, overall health, lifestyle risk factors, and family history of disease. Counseling  Your health care provider may ask you questions about your:  Alcohol use.  Tobacco use.  Drug  use.  Emotional well-being.  Home and relationship well-being.  Sexual activity.  Eating habits.  History of falls.  Memory and ability to understand (cognition).  Work and work Astronomer. Screening  You may have the following tests or measurements:  Height, weight, and BMI.  Blood pressure.  Lipid and cholesterol levels. These may be checked every 5 years, or more frequently if you are over 62 years old.  Skin check.  Lung cancer screening. You may have this screening every year starting at age 4 if you have a 30-pack-year history of smoking and currently smoke or have quit within the past 15 years.  Fecal occult blood test (FOBT) of the stool. You may have this test every year starting at age 81.  Flexible sigmoidoscopy or colonoscopy. You may have a sigmoidoscopy every 5 years or a colonoscopy every 10 years starting at age 52.  Prostate cancer screening. Recommendations will vary depending on your family history and other risks.  Hepatitis C blood test.  Hepatitis B blood test.  Sexually transmitted disease (STD) testing.  Diabetes screening. This is done by checking your blood sugar (glucose) after you have not eaten for a while (fasting). You may have this done every 1-3 years.  Abdominal aortic aneurysm (AAA) screening. You may need this if you are a current or former smoker.  Osteoporosis. You may be screened starting at age 74 if you are at high risk. Talk with your health care provider about your test results, treatment options, and if necessary, the need for more tests. Vaccines  Your health care provider may recommend certain vaccines, such as:  Influenza vaccine. This is recommended every year.  Tetanus, diphtheria, and acellular pertussis (Tdap, Td) vaccine. You may need a Td booster every 10 years.  Zoster vaccine. You may need this after age 11.  Pneumococcal 13-valent conjugate (PCV13) vaccine. One dose is recommended after age  6.  Pneumococcal polysaccharide (PPSV23) vaccine. One dose is recommended after age 69. Talk to your health care provider about which screenings and vaccines you need and how often you need them. This information is not intended to replace advice given to you by your health care provider. Make sure you discuss any questions you have with your health care provider. Document Released: 07/06/2015 Document Revised: 02/27/2016 Document Reviewed: 04/10/2015 Elsevier Interactive Patient Education  2017 [place] Prevention in the Home Falls can cause injuries. They can happen to people of all ages. There are many things you can do to make your home safe and to help prevent falls. What can I do on the outside of my home?  Regularly fix the edges of walkways and driveways and fix any cracks.  Remove anything that might make you trip as you walk through a door, such as a raised step or threshold.  Trim any bushes or trees on the path to your home.  Use bright outdoor lighting.  Clear any walking paths of anything that might make someone trip, such as rocks or tools.  Regularly check to see if handrails are loose or broken. Make sure that both sides of any steps have handrails.  Any raised decks and porches should have guardrails on the edges.  Have any leaves, snow, or ice cleared regularly.  Use sand or salt on walking paths during winter.  Clean up any spills in your garage right away. This includes oil or grease spills. What can I do in the bathroom?  Use night lights.  Install grab bars by the toilet and in the tub and shower. Do not use towel bars as grab bars.  Use non-skid mats or decals in the tub or shower.  If you need to sit down in the shower, use a plastic, non-slip stool.  Keep the floor dry. Clean up any water that spills on the floor as soon as it happens.  Remove soap buildup in the tub or shower regularly.  Attach bath mats securely with double-sided  non-slip rug tape.  Do not have throw rugs and other things on the floor that can make you trip. What can I do in the bedroom?  Use night lights.  Make sure that you have a light by your bed that is easy to reach.  Do not use any sheets or blankets that are too big for your bed. They should not hang down onto the floor.  Have a firm chair that has side arms. You can use this for support while you get dressed.  Do not have throw rugs and other things on the floor that can make you trip. What can I do in the kitchen?  Clean up any spills right away.  Avoid walking on wet floors.  Keep items that you use a lot in easy-to-reach places.  If you need to reach something above you, use a strong step stool that has a grab bar.  Keep electrical cords out of the way.  Do not use floor polish or wax that makes floors slippery. If you must use wax, use non-skid floor wax.  Do not have throw rugs and other things on the floor that can make you trip. What can I do  with my stairs?  Do not leave any items on the stairs.  Make sure that there are handrails on both sides of the stairs and use them. Fix handrails that are broken or loose. Make sure that handrails are as long as the stairways.  Check any carpeting to make sure that it is firmly attached to the stairs. Fix any carpet that is loose or worn.  Avoid having throw rugs at the top or bottom of the stairs. If you do have throw rugs, attach them to the floor with carpet tape.  Make sure that you have a light switch at the top of the stairs and the bottom of the stairs. If you do not have them, ask someone to add them for you. What else can I do to help prevent falls?  Wear shoes that:  Do not have high heels.  Have rubber bottoms.  Are comfortable and fit you well.  Are closed at the toe. Do not wear sandals.  If you use a stepladder:  Make sure that it is fully opened. Do not climb a closed stepladder.  Make sure that both  sides of the stepladder are locked into place.  Ask someone to hold it for you, if possible.  Clearly mark and make sure that you can see:  Any grab bars or handrails.  First and last steps.  Where the edge of each step is.  Use tools that help you move around (mobility aids) if they are needed. These include:  Canes.  Walkers.  Scooters.  Crutches.  Turn on the lights when you go into a dark area. Replace any light bulbs as soon as they burn out.  Set up your furniture so you have a clear path. Avoid moving your furniture around.  If any of your floors are uneven, fix them.  If there are any pets around you, be aware of where they are.  Review your medicines with your doctor. Some medicines can make you feel dizzy. This can increase your chance of falling. Ask your doctor what other things that you can do to help prevent falls. This information is not intended to replace advice given to you by your health care provider. Make sure you discuss any questions you have with your health care provider. Document Released: 04/05/2009 Document Revised: 11/15/2015 Document Reviewed: 07/14/2014 Elsevier Interactive Patient Education  2017 [employer].

## 2019-10-19 NOTE — Progress Notes (Signed)
Subjective:   Nicholas Gross is a 86 y.o. male who presents for Medicare Annual/Subsequent preventive examination.  This visit is being conducted via phone call  - after an attmept to do on video chat - due to the COVID-19 pandemic. This patient has given me verbal consent via phone to conduct this visit, patient states they are participating from their home address. Some vital signs may be absent or patient reported.   Patient identification: identified by name, DOB, and current address.    Review of Systems:  Cardiac Risk Factors include: advanced age (>86men, >7 women);male gender;hypertension     Objective:    Vitals: There were no vitals taken for this visit.  There is no height or weight on file to calculate BMI.  Advanced Directives 10/19/2019 08/30/2018 08/26/2017  Does Patient Have a Medical Advance Directive? No No No  Would patient like information on creating a medical advance directive? - No - Patient declined No - Patient declined    Tobacco Social History   Tobacco Use  Smoking Status Former Smoker  . Packs/day: 1.00  . Years: 17.00  . Pack years: 17.00  . Types: Cigarettes  . Quit date: 06/23/1968  . Years since quitting: 51.3  Smokeless Tobacco Never Used     Counseling given: Not Answered   Clinical Intake:  Pre-visit preparation completed: Yes  Pain : No/denies pain     Nutritional Risks: None Diabetes: No  How often do you need to have someone help you when you read instructions, pamphlets, or other written materials from your doctor or pharmacy?: 1 - Never  Interpreter Needed?: No  Information entered by :: Jakeia Carreras,LPN  Past Medical History:  Diagnosis Date  . CAD (coronary artery disease)   . History of GI bleed 2008  . History of tobacco use    17 pack years, quit around 1970  . Hyperlipidemia   . Hypertension   . Overweight    Past Surgical History:  Procedure Laterality Date  . CAROTID ENDARTERECTOMY Left 2010  .  CATARACT EXTRACTION    . cypher stent  09/12/02   s/p cypher stent mid-LAD  . TENDON REPAIR  1991   finger and arm   Family History  Problem Relation Age of Onset  . Heart disease Father        possibly  . Cancer Sister        breast  . AAA (abdominal aortic aneurysm) Brother   . Cancer Sister        lung   Social History   Socioeconomic History  . Marital status: Widowed    Spouse name: Not on file  . Number of children: Not on file  . Years of education: some college in [employer]   . Highest education level: High school graduate  Occupational History  . Occupation: retired   Tobacco Use  . Smoking status: Former Smoker    Packs/day: 1.00    Years: 17.00    Pack years: 17.00    Types: Cigarettes    Quit date: 06/23/1968    Years since quitting: 51.3  . Smokeless tobacco: Never Used  Substance and Sexual Activity  . Alcohol use: No  . Drug use: No  . Sexual activity: Not on file  Other Topics Concern  . Not on file  Social History Narrative   Attends church, neighbor take him    Social Determinants of Health   Financial Resource Strain: Low Risk   . Difficulty of  Paying Living Expenses: Not hard at all  Food Insecurity: No Food Insecurity  . Worried About Charity fundraiser in the Last Year: Never true  . Ran Out of Food in the Last Year: Never true  Transportation Needs: No Transportation Needs  . Lack of Transportation (Medical): No  . Lack of Transportation (Non-Medical): No  Physical Activity: Insufficiently Active  . Days of Exercise per Week: 5 days  . Minutes of Exercise per Session: 20 min  Stress: No Stress Concern Present  . Feeling of Stress : Not at all  Social Connections: Moderately Isolated  . Frequency of Communication with Friends and Family: Once a week  . Frequency of Social Gatherings with Friends and Family: Once a week  . Attends Religious Services: More than 4 times per year  . Active Member of Clubs or Organizations: No  . Attends English as a second language teacher Meetings: Never  . Marital Status: Widowed    Outpatient Encounter Medications as of 10/19/2019  Medication Sig  . aspirin EC 81 MG tablet Take 1 tablet (81 mg total) by mouth daily.  . Multiple Vitamins-Minerals (PRESERVISION AREDS PO) Take by mouth 2 (two) times daily.  . rosuvastatin (CRESTOR) 10 MG tablet TAKE 1 TABLET BY MOUTH ONCE DAILY  . [DISCONTINUED] furosemide (LASIX) 20 MG tablet Take 1 tablet (20 mg total) by mouth daily. (Patient not taking: Reported on 08/09/2019)   No facility-administered encounter medications on file as of 10/19/2019.    Activities of Daily Living In your present state of health, do you have any difficulty performing the following activities: 10/19/2019  Hearing? N  Comment no hearing aids  Vision? Y  Comment eyeglasses, [facility]. macular degeneration  Difficulty concentrating or making decisions? N  Walking or climbing stairs? N  Dressing or bathing? N  Doing errands, shopping? Y  Comment friend helps with Copywriter, advertising and eating ? N  Using the Toilet? N  In the past six months, have you accidently leaked urine? N  Do you have problems with loss of bowel control? N  Managing your Medications? N  Managing your Finances? N  Housekeeping or managing your Housekeeping? N  Some recent data might be hidden    Patient Care Team: [name], DO as PCP - General (Family Medicine) Lucky Cowboy, Erskine Squibb, MD as Referring Physician (Vascular Surgery) Vanita Ingles, RN as Case Manager (General Practice)   Assessment:   This is a routine wellness examination for Nicholas Gross.  Exercise Activities and Dietary recommendations Current Exercise Habits: Home exercise routine, Type of exercise: walking;strength training/weights(push ups, walking), Time (Minutes): 35, Frequency (Times/Week): 6, Weekly Exercise (Minutes/Week): 210, Intensity: Mild, Exercise limited by: None identified  Goals Addressed   None     Fall  Risk: Fall Risk  10/19/2019 08/30/2018 08/26/2017 08/21/2016 08/15/2015  Falls in the past year? 0 0 No No No  Number falls in past yr: 0 0 - - -  Injury with Fall? 0 0 - - -  Follow up - Falls evaluation completed - - -    FALL RISK PREVENTION PERTAINING TO THE HOME:  Any stairs in or around the home? No  If so, are there any without handrails? No   Home free of loose throw rugs in walkways, pet beds, electrical cords, etc? Yes  Adequate lighting in your home to reduce risk of falls? Yes   ASSISTIVE DEVICES UTILIZED TO PREVENT FALLS:  Life alert? No  Use of a cane, walker  or w/c? No  Grab bars in the bathroom? No  Shower chair or bench in shower? No  Elevated toilet seat or a handicapped toilet? No   TIMED UP AND GO:  Unable to perform   Depression Screen PHQ 2/9 Scores 10/19/2019 08/09/2019 08/30/2018 08/26/2017  PHQ - 2 Score 0 0 0 0  PHQ- 9 Score - - 0 1    Cognitive Function     6CIT Screen 08/30/2018 08/26/2017 08/21/2016  What Year? 0 points 0 points 0 points  What month? 0 points 0 points 0 points  What time? 0 points 0 points 0 points  Count back from 20 0 points 0 points 0 points  Months in reverse 0 points 0 points 0 points  Repeat phrase 2 points 4 points 2 points  Total Score 2 4 2     Immunization History  Administered Date(s) Administered  . Fluad Quad(high Dose 65+) 03/18/2019  . Influenza, High Dose Seasonal PF 02/26/2018, 04/16/2018  . Influenza-Unspecified 04/18/2015, 04/10/2016, 04/21/2017  . PFIZER SARS-COV-2 Vaccination 07/14/2019, 08/04/2019  . Pneumococcal Polysaccharide-23 06/23/1998    Qualifies for Shingles Vaccine? Yes  Zostavax completed n/a Due for Shingrix. Education has been provided regarding the importance of this vaccine. Pt has been advised to call insurance company to determine out of pocket expense. Advised may also receive vaccine at local pharmacy or Health Dept. Verbalized acceptance and understanding.  Tdap: Discussed need for TD/TDAP  vaccine, patient verbalized understanding that this is not covered as a preventative with there insurance and to call the office if he develops any new skin injuries, ie: cuts, scrapes, bug bites, or open wounds.  Flu Vaccine: up to date   Pneumococcal Vaccine: completed, declined booster   Covid-19 Vaccine:  Completed vaccines  Screening Tests Health Maintenance  Topic Date Due  . TETANUS/TDAP  10/18/2020 (Originally 07/10/1946)  . PNA vac Low Risk Adult (2 of 2 - PCV13) 10/18/2020 (Originally 06/24/1999)  . INFLUENZA VACCINE  01/22/2020  . COVID-19 Vaccine  Completed   Cancer Screenings:  Colorectal Screening: no longer required   Lung Cancer Screening: (Low Dose CT Chest recommended if Age 32-80 years, 30 pack-year currently smoking OR have quit w/in 15years.) does not qualify.     Additional Screening:  Hepatitis C Screening: does not qualify  Vision Screening: Recommended annual ophthalmology exams for early detection of glaucoma and other disorders of the eye. Is the patient up to date with their annual eye exam?  Yes  Who is the provider or what is the name of the office in which the pt attends annual eye exams? [facility]   Dental Screening: Recommended annual dental exams for proper oral hygiene  Community Resource Referral:  CRR required this visit?  No        Plan:  I have personally reviewed and addressed the Medicare Annual Wellness questionnaire and have noted the following in the patient's chart:  A. Medical and social history B. Use of alcohol, tobacco or illicit drugs  C. Current medications and supplements D. Functional ability and status E.  Nutritional status F.  Physical activity G. Advance directives H. List of other physicians I.  Hospitalizations, surgeries, and ER visits in previous 12 months J.  Vitals K. Screenings such as hearing and vision if needed, cognitive and depression L. Referrals and appointments   In addition, I have  reviewed and discussed with patient certain preventive protocols, quality metrics, and best practice recommendations. A written personalized care plan for preventive services  as well as general preventive health recommendations were provided to patient.   Signed,   Collene Schlichter, LPN  [id number] Nurse Health Advisor   Nurse Notes: none

## 2019-11-03 ENCOUNTER — Ambulatory Visit (INDEPENDENT_AMBULATORY_CARE_PROVIDER_SITE_OTHER): Payer: Medicare Other | Admitting: Family Medicine

## 2019-11-03 ENCOUNTER — Other Ambulatory Visit: Payer: Self-pay

## 2019-11-03 ENCOUNTER — Encounter: Payer: Self-pay | Admitting: Family Medicine

## 2019-11-03 VITALS — BP 166/66 | HR 55 | Temp 97.9°F | Ht 69.19 in | Wt 175.6 lb

## 2019-11-03 DIAGNOSIS — E782 Mixed hyperlipidemia: Secondary | ICD-10-CM | POA: Diagnosis not present

## 2019-11-03 DIAGNOSIS — I10 Essential (primary) hypertension: Secondary | ICD-10-CM

## 2019-11-03 DIAGNOSIS — R21 Rash and other nonspecific skin eruption: Secondary | ICD-10-CM | POA: Diagnosis not present

## 2019-11-03 LAB — MICROALBUMIN, URINE WAIVED
Creatinine, Urine Waived: 10 mg/dL (ref 10–300)
Microalb, Ur Waived: 10 mg/L (ref 0–19)
Microalb/Creat Ratio: <30 mg/g (ref <30)

## 2019-11-03 MED ORDER — LISINOPRIL 5 MG PO TABS: 5.0000 mg | ORAL_TABLET | Freq: Every day | ORAL | 3 refills | Status: DC

## 2019-11-03 NOTE — Progress Notes (Signed)
BP (!) 166/66 (BP Location: Left Arm, Patient Position: Sitting, Cuff Size: Normal)   Pulse (!) 55   Temp 97.9 F (36.6 C) (Oral)   Ht 5' 9.19" (1.757 m)   Wt 175 lb 9.6 oz (79.7 kg)   SpO2 99%   BMI 25.79 kg/m    Subjective:    Patient ID: Nicholas Gross, male    DOB: 09/21/1927, 86 y.o.   MRN: [id number]  HPI: Nicholas Gross is a 86 y.o. male  Chief Complaint  Patient presents with  . Rash  . Hypertension   HYPERTENSION Hypertension status: uncontrolled  Satisfied with current treatment? yes Duration of hypertension: chronic BP monitoring frequency:  not checking BP range: 160s/90 BP medication side effects:  no Medication compliance: not on anything Previous BP meds: none Aspirin: no Recurrent headaches: no Visual changes: no Palpitations: no Dyspnea: no Chest pain: no Lower extremity edema: no Dizzy/lightheaded: no  HYPERLIPIDEMIA Hyperlipidemia status: excellent compliance Satisfied with current treatment?  yes Side effects:  no Medication compliance: good compliance Past cholesterol meds: crestor Supplements: none Aspirin:  no The ASCVD Risk score Denman George DC Jr., et al., 2013) failed to calculate for the following reasons:   The 2013 ASCVD risk score is only valid for ages 37 to 59 Chest pain:  no  RASH Duration:  chronic  Location: R leg   Itching: no Burning: no Redness: yes Oozing: no Scaling: no Blisters: no Painful: no Fevers: no Change in detergents/soaps/personal care products: no Recent illness: no Recent travel:no History of same: yes Context: stable Alleviating factors: hydrocortisone cream, benadryl and lotion/moisturizer Treatments attempted:hydrocortisone cream, benadryl, OTC anit-fungal and lotion/moisturizer Shortness of breath: no  Throat/tongue swelling: no Myalgias/arthralgias: no  Relevant past medical, surgical, family and social history reviewed and updated as indicated. Interim medical history since our last  visit reviewed. Allergies and medications reviewed and updated.  Review of Systems  Constitutional: Negative.   Respiratory: Negative.   Cardiovascular: Negative.   Gastrointestinal: Negative.   Musculoskeletal: Negative.   Psychiatric/Behavioral: Negative.     Per HPI unless specifically indicated above     Objective:    BP (!) 166/66 (BP Location: Left Arm, Patient Position: Sitting, Cuff Size: Normal)   Pulse (!) 55   Temp 97.9 F (36.6 C) (Oral)   Ht 5' 9.19" (1.757 m)   Wt 175 lb 9.6 oz (79.7 kg)   SpO2 99%   BMI 25.79 kg/m   Wt Readings from Last 3 Encounters:  11/03/19 175 lb 9.6 oz (79.7 kg)  05/06/19 175 lb (79.4 kg)  03/25/19 175 lb (79.4 kg)    Physical Exam Vitals and nursing note reviewed.  Constitutional:      General: He is not in acute distress.    Appearance: Normal appearance. He is not ill-appearing, toxic-appearing or diaphoretic.  HENT:     Head: Normocephalic and atraumatic.     Right Ear: External ear normal.     Left Ear: External ear normal.     Nose: Nose normal.     Mouth/Throat:     Mouth: Mucous membranes are moist.     Pharynx: Oropharynx is clear.  Eyes:     General: No scleral icterus.       Right eye: No discharge.        Left eye: No discharge.     Extraocular Movements: Extraocular movements intact.     Conjunctiva/sclera: Conjunctivae normal.     Pupils: Pupils are equal, round, and reactive to  light.  Cardiovascular:     Rate and Rhythm: Normal rate and regular rhythm.     Pulses: Normal pulses.     Heart sounds: Normal heart sounds. No murmur. No friction rub. No gallop.   Pulmonary:     Effort: Pulmonary effort is normal. No respiratory distress.     Breath sounds: Normal breath sounds. No stridor. No wheezing, rhonchi or rales.  Chest:     Chest wall: No tenderness.  Musculoskeletal:        General: Normal range of motion.     Cervical back: Normal range of motion and neck supple.  Skin:    General: Skin is warm  and dry.     Capillary Refill: Capillary refill takes less than 2 seconds.     Coloration: Skin is not jaundiced or pale.     Findings: Erythema (erythematous pearly skin on R ankle) present. No bruising, lesion or rash.  Neurological:     General: No focal deficit present.     Mental Status: He is alert and oriented to person, place, and time. Mental status is at baseline.  Psychiatric:        Mood and Affect: Mood normal.        Behavior: Behavior normal.        Thought Content: Thought content normal.        Judgment: Judgment normal.     Results for orders placed or performed in visit on 11/03/19  CBC with Differential/Platelet  Result Value Ref Range   WBC 6.2 3.4 - 10.8 x10E3/uL   RBC 4.39 4.14 - 5.80 x10E6/uL   Hemoglobin 13.9 13.0 - 17.7 g/dL   Hematocrit 79.0 24.0 - 51.0 %   MCV 96 79 - 97 fL   MCH 31.7 26.6 - 33.0 pg   MCHC 33.1 31.5 - 35.7 g/dL   RDW 97.3 53.2 - 99.2 %   Platelets 212 150 - 450 x10E3/uL   Neutrophils 66 Not Estab. %   Lymphs 22 Not Estab. %   Monocytes 9 Not Estab. %   Eos 2 Not Estab. %   Basos 1 Not Estab. %   Neutrophils Absolute 4.1 1.4 - 7.0 x10E3/uL   Lymphocytes Absolute 1.4 0.7 - 3.1 x10E3/uL   Monocytes Absolute 0.6 0.1 - 0.9 x10E3/uL   EOS (ABSOLUTE) 0.2 0.0 - 0.4 x10E3/uL   Basophils Absolute 0.1 0.0 - 0.2 x10E3/uL   Immature Granulocytes 0 Not Estab. %   Immature Grans (Abs) 0.0 0.0 - 0.1 x10E3/uL  Comprehensive metabolic panel  Result Value Ref Range   Glucose 98 65 - 99 mg/dL   BUN 10 10 - 36 mg/dL   Creatinine, Ser 4.26 0.76 - 1.27 mg/dL   GFR calc non Af Amer 76 >59 mL/min/1.73   GFR calc Af Amer 88 >59 mL/min/1.73   BUN/Creatinine Ratio 12 10 - 24   Sodium 138 134 - 144 mmol/L   Potassium 4.1 3.5 - 5.2 mmol/L   Chloride 102 96 - 106 mmol/L   CO2 24 20 - 29 mmol/L   Calcium 9.3 8.6 - 10.2 mg/dL   Total Protein 6.1 6.0 - 8.5 g/dL   Albumin 4.5 3.5 - 4.6 g/dL   Globulin, Total 1.6 1.5 - 4.5 g/dL   Albumin/Globulin Ratio  2.8 (H) 1.2 - 2.2   Bilirubin Total 0.5 0.0 - 1.2 mg/dL   Alkaline Phosphatase 63 39 - 117 IU/L   AST 27 0 - 40 IU/L   ALT 19 0 -  44 IU/L  Lipid Panel w/o Chol/HDL Ratio  Result Value Ref Range   Cholesterol, Total 108 100 - 199 mg/dL   Triglycerides 82 0 - 149 mg/dL   HDL 49 >39 mg/dL   VLDL Cholesterol Cal 16 5 - 40 mg/dL   LDL Chol Calc (NIH) 43 0 - 99 mg/dL  Microalbumin, Urine Waived  Result Value Ref Range   Microalb, Ur Waived 10 0 - 19 mg/L   Creatinine, Urine Waived 10 10 - 300 mg/dL   Microalb/Creat Ratio <30 <30 mg/g      Assessment & Plan:   Problem List Items Addressed This Visit      Cardiovascular and Mediastinum   Hypertension    BP continues to run high. He is not interested in medication. Discussed that I'll send him through very low dose lisinopril and he will decide if he would like to start it or not. Labs drawn today. Continue to monitor.       Relevant Medications   lisinopril (ZESTRIL) 5 MG tablet   Other Relevant Orders   CBC with Differential/Platelet (Completed)   Comprehensive metabolic panel (Completed)   Microalbumin, Urine Waived (Completed)     Other   Hyperlipemia - Primary    Under good control on current regimen. Continue current regimen. Continue to monitor. Call with any concerns. Refills given. Labs drawn today.       Relevant Medications   lisinopril (ZESTRIL) 5 MG tablet   Other Relevant Orders   Comprehensive metabolic panel (Completed)   Lipid Panel w/o Chol/HDL Ratio (Completed)    Other Visit Diagnoses    Rash       Healing well. Continue to monitor.        Follow up plan: Return in about 3 months (around 02/03/2020).

## 2019-11-04 ENCOUNTER — Encounter: Payer: Self-pay | Admitting: Family Medicine

## 2019-11-04 ENCOUNTER — Ambulatory Visit (INDEPENDENT_AMBULATORY_CARE_PROVIDER_SITE_OTHER): Payer: Medicare Other | Admitting: Vascular Surgery

## 2019-11-04 LAB — CBC WITH DIFFERENTIAL/PLATELET
Basophils Absolute: 0.1 10*3/uL (ref 0.0–0.2)
Basos: 1 %
EOS (ABSOLUTE): 0.2 10*3/uL (ref 0.0–0.4)
Eos: 2 %
Hematocrit: 42 % (ref 37.5–51.0)
Hemoglobin: 13.9 g/dL (ref 13.0–17.7)
Immature Grans (Abs): 0 10*3/uL (ref 0.0–0.1)
Immature Granulocytes: 0 %
Lymphocytes Absolute: 1.4 10*3/uL (ref 0.7–3.1)
Lymphs: 22 %
MCH: 31.7 pg (ref 26.6–33.0)
MCHC: 33.1 g/dL (ref 31.5–35.7)
MCV: 96 fL (ref 79–97)
Monocytes Absolute: 0.6 10*3/uL (ref 0.1–0.9)
Monocytes: 9 %
Neutrophils Absolute: 4.1 10*3/uL (ref 1.4–7.0)
Neutrophils: 66 %
Platelets: 212 10*3/uL (ref 150–450)
RBC: 4.39 x10E6/uL (ref 4.14–5.80)
RDW: 11.9 % (ref 11.6–15.4)
WBC: 6.2 10*3/uL (ref 3.4–10.8)

## 2019-11-04 LAB — LIPID PANEL W/O CHOL/HDL RATIO
Cholesterol, Total: 108 mg/dL (ref 100–199)
HDL: 49 mg/dL (ref >39)
LDL Chol Calc (NIH): 43 mg/dL (ref 0–99)
Triglycerides: 82 mg/dL (ref 0–149)
VLDL Cholesterol Cal: 16 mg/dL (ref 5–40)

## 2019-11-04 LAB — COMPREHENSIVE METABOLIC PANEL
ALT: 19 IU/L (ref 0–44)
AST: 27 IU/L (ref 0–40)
Albumin/Globulin Ratio: 2.8 — ABNORMAL HIGH (ref 1.2–2.2)
Albumin: 4.5 g/dL (ref 3.5–4.6)
Alkaline Phosphatase: 63 IU/L (ref 39–117)
BUN/Creatinine Ratio: 12 (ref 10–24)
BUN: 10 mg/dL (ref 10–36)
Bilirubin Total: 0.5 mg/dL (ref 0.0–1.2)
CO2: 24 mmol/L (ref 20–29)
Calcium: 9.3 mg/dL (ref 8.6–10.2)
Chloride: 102 mmol/L (ref 96–106)
Creatinine, Ser: 0.86 mg/dL (ref 0.76–1.27)
GFR calc Af Amer: 88 mL/min/{1.73_m2} (ref >59)
GFR calc non Af Amer: 76 mL/min/{1.73_m2} (ref >59)
Globulin, Total: 1.6 g/dL (ref 1.5–4.5)
Glucose: 98 mg/dL (ref 65–99)
Potassium: 4.1 mmol/L (ref 3.5–5.2)
Sodium: 138 mmol/L (ref 134–144)
Total Protein: 6.1 g/dL (ref 6.0–8.5)

## 2019-11-06 ENCOUNTER — Encounter: Payer: Self-pay | Admitting: Family Medicine

## 2019-11-06 NOTE — Assessment & Plan Note (Signed)
BP continues to run high. He is not interested in medication. Discussed that I'll send him through very low dose lisinopril and he will decide if he would like to start it or not. Labs drawn today. Continue to monitor.

## 2019-11-06 NOTE — Assessment & Plan Note (Signed)
Under good control on current regimen. Continue current regimen. Continue to monitor. Call with any concerns. Refills given. Labs drawn today.   

## 2019-12-08 ENCOUNTER — Other Ambulatory Visit: Payer: Self-pay | Admitting: Family Medicine

## 2019-12-14 ENCOUNTER — Ambulatory Visit (INDEPENDENT_AMBULATORY_CARE_PROVIDER_SITE_OTHER): Payer: Medicare Other | Admitting: General Practice

## 2019-12-14 ENCOUNTER — Telehealth: Payer: Self-pay | Admitting: General Practice

## 2019-12-14 DIAGNOSIS — I2510 Atherosclerotic heart disease of native coronary artery without angina pectoris: Secondary | ICD-10-CM | POA: Diagnosis not present

## 2019-12-14 DIAGNOSIS — I10 Essential (primary) hypertension: Secondary | ICD-10-CM

## 2019-12-14 DIAGNOSIS — E782 Mixed hyperlipidemia: Secondary | ICD-10-CM | POA: Diagnosis not present

## 2019-12-14 DIAGNOSIS — R21 Rash and other nonspecific skin eruption: Secondary | ICD-10-CM

## 2019-12-14 NOTE — Patient Instructions (Signed)
Visit Information  Goals Addressed            This Visit's Progress   . COMPLETED: RNCM: I have a rash pretty much all over       Current Barriers:  [name] Knowledge Deficits related to cause of the rash he has had "for a while" and what to do for it . Lacks caregiver support.  . Transportation barriers . Vision issues- low vision due to macular degeneration   Nurse Case Manager Clinical Goal(s):  [name] Over the next 90 days, patient will work with [facility] and pcp to address needs related to rash the patient has had for some time now . Over the next 90 days, patient will attend all scheduled medical appointments: front office staff to call and get an appointment for the patient to see the pcp- reminder sent today 09/30/2019 . Over the next 90 days, patient will demonstrate improved adherence to prescribed treatment plan for rash as evidenced byfollow up with pcp and recommendations for treatment of the rash- per the patient the rash has gone and the patient is only having itching "every now and then"  Interventions:  . Evaluation of current treatment plan related to rash and patient's adherence to plan as established by provider. . Provided education to patient re: related to coming into the office to be seen concerning the rash and need for follow up with pcp.  Per the patient he feels the rash came about because of the Covid 19 vaccination. He had the second one on 2/11.  States the rash is "almost" completely gone now. He does experience itching at times but it is not often at all.  . Reviewed medications with patient and discussed compliance- the patient verbalized he is no longer taking the Lasix . Collaborated with front office staff regarding securing an appointment for the patient to come in to be evaluated by pcp for post rash and elevated blood pressures.  Blood pressures have stabilized.  . The patient believes the rash came from the [place] vaccines he had. He states the rash is completely  resolved now.   Patient Self Care Activities:  . Patient verbalizes understanding of plan to have the office staff call and set up an appointment to see pcp concerning post rash and period of elevated blood pressures . Attends all scheduled provider appointments . Calls provider office for new concerns or questions . Unable to independently manage care related to rash the patient has had for a while and elevated blood pressures . Lacks social connections  Please see past updates related to this goal by clicking on the "Past Updates" button in the selected goal      . RNCM: I have cut back on my salt intake       Current Barriers:  . Chronic Disease Management support, education, and care coordination needs related to CAD, HTN, HLD, and Lymphedema   Clinical Goal(s) related to CAD, HTN, HLD, and Lymphedema :  Over the next 90 days, patient will:  . Work with the care management team to address educational, disease management, and care coordination needs  . Begin or continue self health monitoring activities as directed today Measure and record blood pressure 5 times per week . Call provider office for new or worsened signs and symptoms Blood pressure findings outside established parameters and New or worsened symptom related to chronic health conditions . Call care management team with questions or concerns . Verbalize basic understanding of patient centered plan of  care established today  Interventions related to CAD, HTN, HLD, and Lymphedema  :  . Evaluation of current treatment plans and patient's adherence to plan as established by provider . Discussed [place] healthy diet- the patient is cutting back on salt.  Advised the patient to rinse canned vegetables and try to stay away from processed foods. The patient cooks for himself and sometimes eats food that his neighbor prepares for him. The patient verbalized that he is doing well with watching his sodium intake and has lost weight  because he has noted that his pants are "too big".  He has a scales but does not feel they are accurate because they are very old. 12-14-2019: The patient endorses a heart healthy diet. Is eating fresh vegetables and fruits. Denies any concerns with dietary restrictions.  . Assessed patient understanding of disease states.  The patient has a good working knowledge of his chronic conditions  . Assessed patient's education and care coordination needs.  The patient has not seen the pcp since September of 2020.  Agrees to have office staff come call and set up an appointment.  12-14-2019: the patient saw the pcp on 11-03-2019 and will have a follow up on 02-03-2020. [name] Provided disease specific education to patient. Praised for watching sodium intake in diet and improved blood pressures. Education and support given . Collaborated with appropriate clinical care team members regarding patient needs . Contacted the front desk to secure an appointment for the patient to come in and be evaluated by the pcp for post rash and elevated  BP- completed . Evaluation of blood pressure readings: The patient states his blood pressure has been doing well. Yesterday his blood pressure was 123/65.  Will continue to monitor at home. The patient has also increased his activity level. He is walking 2 times a day 15 mins each time and doing 40 push ups almost daily. Praise the patient for increasing activity level.   Patient Self Care Activities related to CAD, HTN, HLD, and Lymphedema :  . Patient is unable to independently self-manage chronic health conditions  Please see past updates related to this goal by clicking on the "Past Updates" button in the selected goal         Patient verbalizes understanding of instructions provided today.   The care management team will reach out to the patient again over the next 60 to 90 days.   [name] RN, MSN, CCM Community Care Coordinator [place]  [facility] Mobile: [phone]

## 2019-12-14 NOTE — Chronic Care Management (AMB) (Signed)
Chronic Care Management   Follow Up Note   12/14/2019 Name: [name] MRN: [id number] DOB: [birth year]  Referred by: Dorcas Carrow, DO Reason for referral : Chronic Care Management (Follow up: RNCM Chronic Disease Management and Care Coordination)   Nicholas Gross is a 86 y.o. year old male who is a primary care patient of Dorcas Carrow, DO. The CCM team was consulted for assistance with chronic disease management and care coordination needs.    Review of patient status, including review of consultants reports, relevant laboratory and other test results, and collaboration with appropriate care team members and the patient's provider was performed as part of comprehensive patient evaluation and provision of chronic care management services.    SDOH (Social Determinants of Health) assessments performed: Yes See Care Plan activities for detailed interventions related to [facility])     Outpatient Encounter Medications as of 12/14/2019  Medication Sig  . aspirin EC 81 MG tablet Take 1 tablet (81 mg total) by mouth daily.  [name] lisinopril (ZESTRIL) 5 MG tablet Take 1 tablet (5 mg total) by mouth daily.  . Multiple Vitamins-Minerals (PRESERVISION AREDS PO) Take by mouth 2 (two) times daily.  . rosuvastatin (CRESTOR) 10 MG tablet TAKE 1 TABLET BY MOUTH ONCE DAILY   No facility-administered encounter medications on file as of 12/14/2019.     Objective:  BP Readings from Last 3 Encounters:  12/13/19 123/65  11/03/19 (!) 166/66  09/29/19 133/63    Goals Addressed            This Visit's Progress   . COMPLETED: RNCM: I have a rash pretty much all over       Current Barriers:  [name] Knowledge Deficits related to cause of the rash he has had "for a while" and what to do for it . Lacks caregiver support.  . Transportation barriers . Vision issues- low vision due to macular degeneration   Nurse Case Manager Clinical Goal(s):  [name] Over the next 90 days, patient will work with [facility]  and pcp to address needs related to rash the patient has had for some time now . Over the next 90 days, patient will attend all scheduled medical appointments: front office staff to call and get an appointment for the patient to see the pcp- reminder sent today 09/30/2019 . Over the next 90 days, patient will demonstrate improved adherence to prescribed treatment plan for rash as evidenced byfollow up with pcp and recommendations for treatment of the rash- per the patient the rash has gone and the patient is only having itching "every now and then"  Interventions:  . Evaluation of current treatment plan related to rash and patient's adherence to plan as established by provider. . Provided education to patient re: related to coming into the office to be seen concerning the rash and need for follow up with pcp.  Per the patient he feels the rash came about because of the Covid 19 vaccination. He had the second one on 2/11.  States the rash is "almost" completely gone now. He does experience itching at times but it is not often at all.  . Reviewed medications with patient and discussed compliance- the patient verbalized he is no longer taking the Lasix . Collaborated with front office staff regarding securing an appointment for the patient to come in to be evaluated by pcp for post rash and elevated blood pressures.  Blood pressures have stabilized.  . The patient believes the rash came from the  COVID19 vaccines he had. He states the rash is completely resolved now.   Patient Self Care Activities:  . Patient verbalizes understanding of plan to have the office staff call and set up an appointment to see pcp concerning post rash and period of elevated blood pressures . Attends all scheduled provider appointments . Calls provider office for new concerns or questions . Unable to independently manage care related to rash the patient has had for a while and elevated blood pressures . Lacks social  connections  Please see past updates related to this goal by clicking on the "Past Updates" button in the selected goal      . RNCM: I have cut back on my salt intake       Current Barriers:  . Chronic Disease Management support, education, and care coordination needs related to CAD, HTN, HLD, and Lymphedema   Clinical Goal(s) related to CAD, HTN, HLD, and Lymphedema :  Over the next 90 days, patient will:  . Work with the care management team to address educational, disease management, and care coordination needs  . Begin or continue self health monitoring activities as directed today Measure and record blood pressure 5 times per week . Call provider office for new or worsened signs and symptoms Blood pressure findings outside established parameters and New or worsened symptom related to chronic health conditions . Call care management team with questions or concerns . Verbalize basic understanding of patient centered plan of care established today  Interventions related to CAD, HTN, HLD, and Lymphedema  :  . Evaluation of current treatment plans and patient's adherence to plan as established by provider . Discussed [place] healthy diet- the patient is cutting back on salt.  Advised the patient to rinse canned vegetables and try to stay away from processed foods. The patient cooks for himself and sometimes eats food that his neighbor prepares for him. The patient verbalized that he is doing well with watching his sodium intake and has lost weight because he has noted that his pants are "too big".  He has a scales but does not feel they are accurate because they are very old. 12-14-2019: The patient endorses a heart healthy diet. Is eating fresh vegetables and fruits. Denies any concerns with dietary restrictions.  . Assessed patient understanding of disease states.  The patient has a good working knowledge of his chronic conditions  . Assessed patient's education and care coordination needs.  The  patient has not seen the pcp since September of 2020.  Agrees to have office staff come call and set up an appointment.  12-14-2019: the patient saw the pcp on 11-03-2019 and will have a follow up on 02-03-2020. [name] Provided disease specific education to patient. Praised for watching sodium intake in diet and improved blood pressures. Education and support given . Collaborated with appropriate clinical care team members regarding patient needs . Contacted the front desk to secure an appointment for the patient to come in and be evaluated by the pcp for post rash and elevated  BP- completed . Evaluation of blood pressure readings: The patient states his blood pressure has been doing well. Yesterday his blood pressure was 123/65.  Will continue to monitor at home. The patient has also increased his activity level. He is walking 2 times a day 15 mins each time and doing 40 push ups almost daily. Praise the patient for increasing activity level.   Patient Self Care Activities related to CAD, HTN, HLD, and Lymphedema :  .  Patient is unable to independently self-manage chronic health conditions  Please see past updates related to this goal by clicking on the "Past Updates" button in the selected goal          Plan:   The care management team will reach out to the patient again over the next 60 to 90 days.    [name] RN, MSN, CCM Community Care Coordinator [place]  [facility] Mobile: [phone]

## 2020-02-03 ENCOUNTER — Ambulatory Visit (INDEPENDENT_AMBULATORY_CARE_PROVIDER_SITE_OTHER): Payer: Medicare Other | Admitting: Family Medicine

## 2020-02-03 ENCOUNTER — Other Ambulatory Visit: Payer: Self-pay

## 2020-02-03 ENCOUNTER — Encounter: Payer: Self-pay | Admitting: Family Medicine

## 2020-02-03 VITALS — BP 170/66 | HR 50 | Temp 98.0°F | Wt 173.4 lb

## 2020-02-03 DIAGNOSIS — E782 Mixed hyperlipidemia: Secondary | ICD-10-CM | POA: Diagnosis not present

## 2020-02-03 DIAGNOSIS — I10 Essential (primary) hypertension: Secondary | ICD-10-CM

## 2020-02-03 NOTE — Progress Notes (Signed)
BP (!) 170/66 (BP Location: Left Arm, Cuff Size: Normal)   Pulse (!) 50   Temp 98 F (36.7 C) (Oral)   Wt 173 lb 6.4 oz (78.7 kg)   SpO2 98%   BMI 25.46 kg/m    Subjective:    Patient ID: Nicholas Gross, male    DOB: Mar 15, 1928, 86 y.o.   MRN: [id number]  HPI: ALVEY BROCKEL is a 86 y.o. male  Chief Complaint  Patient presents with  . Hypertension  . Hyperlipidemia   HYPERTENSION / HYPERLIPIDEMIA Satisfied with current treatment? yes Duration of hypertension: chronic BP monitoring frequency: a few times a week BP range: 130s/60s BP medication side effects: no Past BP meds: lisinopril- but stopped it Duration of hyperlipidemia: chronic Cholesterol medication side effects: no Cholesterol supplements: none Past cholesterol medications: crestor Medication compliance: good compliance Aspirin: yes Recent stressors: no Recurrent headaches: no Visual changes: no Palpitations: no Dyspnea: no Chest pain: no Lower extremity edema: no Dizzy/lightheaded: no  Relevant past medical, surgical, family and social history reviewed and updated as indicated. Interim medical history since our last visit reviewed. Allergies and medications reviewed and updated.  Review of Systems  Constitutional: Negative.   Respiratory: Negative.   Cardiovascular: Negative.   Gastrointestinal: Negative.   Psychiatric/Behavioral: Negative.     Per HPI unless specifically indicated above     Objective:    BP (!) 170/66 (BP Location: Left Arm, Cuff Size: Normal)   Pulse (!) 50   Temp 98 F (36.7 C) (Oral)   Wt 173 lb 6.4 oz (78.7 kg)   SpO2 98%   BMI 25.46 kg/m   Wt Readings from Last 3 Encounters:  02/03/20 173 lb 6.4 oz (78.7 kg)  11/03/19 175 lb 9.6 oz (79.7 kg)  05/06/19 175 lb (79.4 kg)    Physical Exam Vitals and nursing note reviewed.  Constitutional:      General: He is not in acute distress.    Appearance: Normal appearance. He is not ill-appearing, toxic-appearing  or diaphoretic.  HENT:     Head: Normocephalic and atraumatic.     Right Ear: External ear normal.     Left Ear: External ear normal.     Nose: Nose normal.     Mouth/Throat:     Mouth: Mucous membranes are moist.     Pharynx: Oropharynx is clear.  Eyes:     General: No scleral icterus.       Right eye: No discharge.        Left eye: No discharge.     Extraocular Movements: Extraocular movements intact.     Conjunctiva/sclera: Conjunctivae normal.     Pupils: Pupils are equal, round, and reactive to light.  Cardiovascular:     Rate and Rhythm: Normal rate and regular rhythm.     Pulses: Normal pulses.     Heart sounds: Normal heart sounds. No murmur heard.  No friction rub. No gallop.   Pulmonary:     Effort: Pulmonary effort is normal. No respiratory distress.     Breath sounds: Normal breath sounds. No stridor. No wheezing, rhonchi or rales.  Chest:     Chest wall: No tenderness.  Musculoskeletal:        General: Normal range of motion.     Cervical back: Normal range of motion and neck supple.  Skin:    General: Skin is warm and dry.     Capillary Refill: Capillary refill takes less than 2 seconds.  Coloration: Skin is not jaundiced or pale.     Findings: No bruising, erythema, lesion or rash.  Neurological:     General: No focal deficit present.     Mental Status: He is alert and oriented to person, place, and time. Mental status is at baseline.  Psychiatric:        Mood and Affect: Mood normal.        Behavior: Behavior normal.        Thought Content: Thought content normal.        Judgment: Judgment normal.     Results for orders placed or performed in visit on 02/03/20  Comprehensive metabolic panel  Result Value Ref Range   Glucose 105 (H) 65 - 99 mg/dL   BUN 7 (L) 10 - 36 mg/dL   Creatinine, Ser 1.51 0.76 - 1.27 mg/dL   GFR calc non Af Amer 79 >59 mL/min/1.73   GFR calc Af Amer 91 >59 mL/min/1.73   BUN/Creatinine Ratio 9 (L) 10 - 24   Sodium 134 134 -  144 mmol/L   Potassium 4.3 3.5 - 5.2 mmol/L   Chloride 98 96 - 106 mmol/L   CO2 22 20 - 29 mmol/L   Calcium 9.1 8.6 - 10.2 mg/dL   Total Protein 6.4 6.0 - 8.5 g/dL   Albumin 4.4 3.5 - 4.6 g/dL   Globulin, Total 2.0 1.5 - 4.5 g/dL   Albumin/Globulin Ratio 2.2 1.2 - 2.2   Bilirubin Total 0.7 0.0 - 1.2 mg/dL   Alkaline Phosphatase 63 48 - 121 IU/L   AST 26 0 - 40 IU/L   ALT 18 0 - 44 IU/L  Lipid Panel w/o Chol/HDL Ratio  Result Value Ref Range   Cholesterol, Total 105 100 - 199 mg/dL   Triglycerides 761 0 - 149 mg/dL   HDL 45 >60 mg/dL   VLDL Cholesterol Cal 21 5 - 40 mg/dL   LDL Chol Calc (NIH) 39 0 - 99 mg/dL      Assessment & Plan:   Problem List Items Addressed This Visit      Cardiovascular and Mediastinum   Hypertension - Primary    Running better at home, still high in the office. Refuses medication. Continue to monitor. Call with any concerns.       Relevant Orders   Comprehensive metabolic panel (Completed)     Other   Hyperlipemia    Under good control on current regimen. Continue current regimen. Continue to monitor. Call with any concerns. Refills given. Labs drawn today.       Relevant Orders   Comprehensive metabolic panel (Completed)   Lipid Panel w/o Chol/HDL Ratio (Completed)       Follow up plan: No follow-ups on file.

## 2020-02-04 ENCOUNTER — Encounter: Payer: Self-pay | Admitting: Family Medicine

## 2020-02-04 LAB — LIPID PANEL W/O CHOL/HDL RATIO
Cholesterol, Total: 105 mg/dL (ref 100–199)
HDL: 45 mg/dL (ref >39)
LDL Chol Calc (NIH): 39 mg/dL (ref 0–99)
Triglycerides: 118 mg/dL (ref 0–149)
VLDL Cholesterol Cal: 21 mg/dL (ref 5–40)

## 2020-02-04 LAB — COMPREHENSIVE METABOLIC PANEL
ALT: 18 IU/L (ref 0–44)
AST: 26 IU/L (ref 0–40)
Albumin/Globulin Ratio: 2.2 (ref 1.2–2.2)
Albumin: 4.4 g/dL (ref 3.5–4.6)
Alkaline Phosphatase: 63 IU/L (ref 48–121)
BUN/Creatinine Ratio: 9 — ABNORMAL LOW (ref 10–24)
BUN: 7 mg/dL — ABNORMAL LOW (ref 10–36)
Bilirubin Total: 0.7 mg/dL (ref 0.0–1.2)
CO2: 22 mmol/L (ref 20–29)
Calcium: 9.1 mg/dL (ref 8.6–10.2)
Chloride: 98 mmol/L (ref 96–106)
Creatinine, Ser: 0.8 mg/dL (ref 0.76–1.27)
GFR calc Af Amer: 91 mL/min/{1.73_m2} (ref >59)
GFR calc non Af Amer: 79 mL/min/{1.73_m2} (ref >59)
Globulin, Total: 2 g/dL (ref 1.5–4.5)
Glucose: 105 mg/dL — ABNORMAL HIGH (ref 65–99)
Potassium: 4.3 mmol/L (ref 3.5–5.2)
Sodium: 134 mmol/L (ref 134–144)
Total Protein: 6.4 g/dL (ref 6.0–8.5)

## 2020-02-04 NOTE — Assessment & Plan Note (Signed)
Under good control on current regimen. Continue current regimen. Continue to monitor. Call with any concerns. Refills given. Labs drawn today.   

## 2020-02-04 NOTE — Assessment & Plan Note (Signed)
Running better at home, still high in the office. Refuses medication. Continue to monitor. Call with any concerns.

## 2020-02-07 ENCOUNTER — Encounter: Payer: Self-pay | Admitting: Family Medicine

## 2020-02-22 ENCOUNTER — Ambulatory Visit: Payer: Self-pay | Admitting: General Practice

## 2020-02-22 ENCOUNTER — Telehealth: Payer: Self-pay | Admitting: General Practice

## 2020-02-22 DIAGNOSIS — I890 Lymphedema, not elsewhere classified: Secondary | ICD-10-CM

## 2020-02-22 DIAGNOSIS — I2510 Atherosclerotic heart disease of native coronary artery without angina pectoris: Secondary | ICD-10-CM

## 2020-02-22 DIAGNOSIS — H3530 Unspecified macular degeneration: Secondary | ICD-10-CM

## 2020-02-22 DIAGNOSIS — I10 Essential (primary) hypertension: Secondary | ICD-10-CM

## 2020-02-22 DIAGNOSIS — E782 Mixed hyperlipidemia: Secondary | ICD-10-CM

## 2020-02-22 NOTE — Chronic Care Management (AMB) (Signed)
Chronic Care Management   Follow Up Note   02/22/2020 Name: Nicholas Gross MRN: [id number] DOB: 1927/12/20  Referred by: Dorcas Carrow, DO Reason for referral : Chronic Care Management (RNCM Follow up for Chronic Disease Management and Care Coordination  Needs )   Nicholas Gross is a 86 y.o. year old male who is a primary care patient of Dorcas Carrow, DO. The CCM team was consulted for assistance with chronic disease management and care coordination needs.    Review of patient status, including review of consultants reports, relevant laboratory and other test results, and collaboration with appropriate care team members and the patient's provider was performed as part of comprehensive patient evaluation and provision of chronic care management services.    SDOH (Social Determinants of Health) assessments performed: Yes See Care Plan activities for detailed interventions related to [facility])     Outpatient Encounter Medications as of 02/22/2020  Medication Sig   aspirin EC 81 MG tablet Take 1 tablet (81 mg total) by mouth daily.   lisinopril (ZESTRIL) 5 MG tablet Take 1 tablet (5 mg total) by mouth daily. (Patient not taking: Reported on 02/03/2020)   Multiple Vitamins-Minerals (PRESERVISION AREDS PO) Take by mouth 2 (two) times daily.   rosuvastatin (CRESTOR) 10 MG tablet TAKE 1 TABLET BY MOUTH ONCE DAILY   No facility-administered encounter medications on file as of 02/22/2020.     Objective:   Goals Addressed            This Visit's Progress    RNCM: I have cut back on my salt intake       Current Barriers:   Chronic Disease Management support, education, and care coordination needs related to CAD, HTN, HLD, and Lymphedema   Clinical Goal(s) related to CAD, HTN, HLD, and Lymphedema :  Over the next 90 days, patient will:   Work with the care management team to address educational, disease management, and care coordination needs   Begin or continue self health  monitoring activities as directed today Measure and record blood pressure 5 times per week  Call provider office for new or worsened signs and symptoms Blood pressure findings outside established parameters and New or worsened symptom related to chronic health conditions  Call care management team with questions or concerns  Verbalize basic understanding of patient centered plan of care established today  Interventions related to CAD, HTN, HLD, and Lymphedema  :   Evaluation of current treatment plans and patient's adherence to plan as established by provider.  02-22-2020: The patient verbalized that he is doing well. Blood pressure was a little high in the office at last visit but states its always like that. At home it is lower.   Discussed [place] healthy diet- the patient is cutting back on salt.  Advised the patient to rinse canned vegetables and try to stay away from processed foods. The patient cooks for himself and sometimes eats food that his neighbor prepares for him. The patient verbalized that he is doing well with watching his sodium intake and has lost weight because he has noted that his pants are "too big".  He has a scales but does not feel they are accurate because they are very old. 02-22-2020: The patient endorses a heart healthy diet. Is eating fresh vegetables and fruits. Denies any concerns with dietary restrictions.   Assessed patient understanding of disease states.  The patient has a good working knowledge of his chronic conditions   Assessed patient's education  and care coordination needs.  The patient has not seen the pcp since September of 2020.  Agrees to have office staff come call and set up an appointment.  12-14-2019: the patient saw the pcp on 11-03-2019 and will have a follow up on 02-03-2020. 02-22-2020 saw the pcp on 02-03-2020- next appointment on 05-08-2020 at 11 am. The RNCM will attempt to have a face to face visit with the patient on the same day.   Provided disease  specific education to patient. Praised for watching sodium intake in diet and improved blood pressures. Education and support given. 02-22-2020: The patient is eating fresh vegetables and has a good appetite. Is monitoring his sodium intake.   Collaborated with appropriate clinical care team members regarding patient needs.  Knows about LCSW and pharmacist. Denies any needs at this time.   Evaluation of blood pressure readings: The patient states his blood pressure has been doing well. Yesterday his blood pressure was 123/65.  Will continue to monitor at home. The patient has also increased his activity level. He is walking 2 times a day 15 mins each time and doing 40 push ups almost daily. Praise the patient for increasing activity level. 02-22-2020: States his pressures at home are good. Last reading was Monday and his blood pressure was 139/69. o BP Readings from Last 3 Encounters: o  02/20/20 o 139/69 o  02/03/20 o (!) 170/66 o  12/13/19 o 123/65 o     Evaluation of macular degeneration. The patient states he has gotten to where he can not read and that is a little frustrating. The patient enjoys reading his bible but even finds that hard to do now as his vision is declining. He states that he has a good memory from reading over the years. He also listens to preachers on tv and the radio and gets a lot out of this. The patient talked about his strong faith and how he feels blessed.   Patient Self Care Activities related to CAD, HTN, HLD, and Lymphedema :   Patient is unable to independently self-manage chronic health conditions  Please see past updates related to this goal by clicking on the "Past Updates" button in the selected goal          Plan:   Face to Face appointment with care management team member scheduled for:  05-08-2020 at 10:15. Plan on having face to face visit when the patient comes in to see the provider for his 3 month follow up visit.    [name] RN, MSN, CCM Community  Care Coordinator [place]   [facility] Mobile: [phone]

## 2020-02-22 NOTE — Patient Instructions (Signed)
Visit Information  Goals Addressed            This Visit's Progress    RNCM: I have cut back on my salt intake       Current Barriers:   Chronic Disease Management support, education, and care coordination needs related to CAD, HTN, HLD, and Lymphedema   Clinical Goal(s) related to CAD, HTN, HLD, and Lymphedema :  Over the next 90 days, patient will:   Work with the care management team to address educational, disease management, and care coordination needs   Begin or continue self health monitoring activities as directed today Measure and record blood pressure 5 times per week  Call provider office for new or worsened signs and symptoms Blood pressure findings outside established parameters and New or worsened symptom related to chronic health conditions  Call care management team with questions or concerns  Verbalize basic understanding of patient centered plan of care established today  Interventions related to CAD, HTN, HLD, and Lymphedema  :   Evaluation of current treatment plans and patient's adherence to plan as established by provider.  02-22-2020: The patient verbalized that he is doing well. Blood pressure was a little high in the office at last visit but states its always like that. At home it is lower.   Discussed [place] healthy diet- the patient is cutting back on salt.  Advised the patient to rinse canned vegetables and try to stay away from processed foods. The patient cooks for himself and sometimes eats food that his neighbor prepares for him. The patient verbalized that he is doing well with watching his sodium intake and has lost weight because he has noted that his pants are "too big".  He has a scales but does not feel they are accurate because they are very old. 02-22-2020: The patient endorses a heart healthy diet. Is eating fresh vegetables and fruits. Denies any concerns with dietary restrictions.   Assessed patient understanding of disease states.  The patient  has a good working knowledge of his chronic conditions   Assessed patient's education and care coordination needs.  The patient has not seen the pcp since September of 2020.  Agrees to have office staff come call and set up an appointment.  12-14-2019: the patient saw the pcp on 11-03-2019 and will have a follow up on 02-03-2020. 02-22-2020 saw the pcp on 02-03-2020- next appointment on 05-08-2020 at 11 am. The RNCM will attempt to have a face to face visit with the patient on the same day.   Provided disease specific education to patient. Praised for watching sodium intake in diet and improved blood pressures. Education and support given. 02-22-2020: The patient is eating fresh vegetables and has a good appetite. Is monitoring his sodium intake.   Collaborated with appropriate clinical care team members regarding patient needs.  Knows about LCSW and pharmacist. Denies any needs at this time.   Evaluation of blood pressure readings: The patient states his blood pressure has been doing well. Yesterday his blood pressure was 123/65.  Will continue to monitor at home. The patient has also increased his activity level. He is walking 2 times a day 15 mins each time and doing 40 push ups almost daily. Praise the patient for increasing activity level. 02-22-2020: States his pressures at home are good. Last reading was Monday and his blood pressure was 139/69. o BP Readings from Last 3 Encounters: o  02/20/20 o 139/69 o  02/03/20 o (!) 170/66 o  12/13/19  o 123/65 o     Evaluation of macular degeneration. The patient states he has gotten to where he can not read and that is a little frustrating. The patient enjoys reading his bible but even finds that hard to do now as his vision is declining. He states that he has a good memory from reading over the years. He also listens to preachers on tv and the radio and gets a lot out of this. The patient talked about his strong faith and how he feels blessed.   Patient Self  Care Activities related to CAD, HTN, HLD, and Lymphedema :   Patient is unable to independently self-manage chronic health conditions  Please see past updates related to this goal by clicking on the "Past Updates" button in the selected goal         Patient verbalizes understanding of instructions provided today.   Face to Face appointment with care management team member scheduled for: 05-08-2020 at 10:15 am  [name] RN, MSN, CCM Community Care Coordinator [place]   [facility] Mobile: [phone]

## 2020-05-08 ENCOUNTER — Ambulatory Visit: Payer: Self-pay | Admitting: General Practice

## 2020-05-08 ENCOUNTER — Other Ambulatory Visit: Payer: Self-pay

## 2020-05-08 ENCOUNTER — Encounter: Payer: Self-pay | Admitting: Family Medicine

## 2020-05-08 ENCOUNTER — Ambulatory Visit (INDEPENDENT_AMBULATORY_CARE_PROVIDER_SITE_OTHER): Payer: Medicare Other | Admitting: Family Medicine

## 2020-05-08 ENCOUNTER — Telehealth: Payer: Self-pay

## 2020-05-08 VITALS — BP 170/76 | HR 55 | Temp 98.7°F | Wt 173.6 lb

## 2020-05-08 DIAGNOSIS — H3530 Unspecified macular degeneration: Secondary | ICD-10-CM

## 2020-05-08 DIAGNOSIS — I890 Lymphedema, not elsewhere classified: Secondary | ICD-10-CM

## 2020-05-08 DIAGNOSIS — I2510 Atherosclerotic heart disease of native coronary artery without angina pectoris: Secondary | ICD-10-CM

## 2020-05-08 DIAGNOSIS — E782 Mixed hyperlipidemia: Secondary | ICD-10-CM

## 2020-05-08 DIAGNOSIS — Z23 Encounter for immunization: Secondary | ICD-10-CM

## 2020-05-08 DIAGNOSIS — I10 Essential (primary) hypertension: Secondary | ICD-10-CM | POA: Diagnosis not present

## 2020-05-08 MED ORDER — LISINOPRIL 5 MG PO TABS
5.0000 mg | ORAL_TABLET | Freq: Every day | ORAL | 1 refills | Status: DC
Start: 2020-05-08 — End: 2020-07-16

## 2020-05-08 MED ORDER — ROSUVASTATIN CALCIUM 10 MG PO TABS
10.0000 mg | ORAL_TABLET | Freq: Every day | ORAL | 1 refills | Status: DC
Start: 2020-05-08 — End: 2020-11-05

## 2020-05-08 NOTE — Assessment & Plan Note (Signed)
Running high today, but under good control at home. Encouraged him to take his lisinopril for kidney protection. He is not sure if he'll pick it up. No other concerns or complaints at this time.

## 2020-05-08 NOTE — Progress Notes (Signed)
BP (!) 170/76   Pulse (!) 55   Temp 98.7 F (37.1 C) (Oral)   Wt 173 lb 9.6 oz (78.7 kg)   SpO2 100%   BMI 25.49 kg/m    Subjective:    Patient ID: Nicholas Gross, male    DOB: 08/03/1927, 86 y.o.   MRN: [id number]  HPI: Nicholas Gross is a 86 y.o. male  Chief Complaint  Patient presents with  . Hypertension  . Pruritis    B/L legs since after  booster covid vacc.   HYPERTENSION Hypertension status: stable- doing well at home Satisfied with current treatment? yes Duration of hypertension: chronic BP monitoring frequency:  a few times a week BP range: 130s/60s, 125/70s BP medication side effects:  no Medication compliance: fair compliance Previous BP meds:lisinopril Aspirin: no Recurrent headaches: no Visual changes: no Palpitations: no Dyspnea: no Chest pain: no Lower extremity edema: no Dizzy/lightheaded: no   Relevant past medical, surgical, family and social history reviewed and updated as indicated. Interim medical history since our last visit reviewed. Allergies and medications reviewed and updated.  Review of Systems  Constitutional: Negative.   Respiratory: Negative.   Cardiovascular: Negative.   Gastrointestinal: Negative.   Genitourinary: Negative.   Musculoskeletal: Negative.   Neurological: Negative.   Psychiatric/Behavioral: Negative.     Per HPI unless specifically indicated above     Objective:    BP (!) 170/76   Pulse (!) 55   Temp 98.7 F (37.1 C) (Oral)   Wt 173 lb 9.6 oz (78.7 kg)   SpO2 100%   BMI 25.49 kg/m   Wt Readings from Last 3 Encounters:  05/08/20 173 lb 9.6 oz (78.7 kg)  02/03/20 173 lb 6.4 oz (78.7 kg)  11/03/19 175 lb 9.6 oz (79.7 kg)    Physical Exam Vitals and nursing note reviewed.  Constitutional:      General: He is not in acute distress.    Appearance: Normal appearance. He is not ill-appearing, toxic-appearing or diaphoretic.  HENT:     Head: Normocephalic and atraumatic.     Right Ear:  External ear normal.     Left Ear: External ear normal.     Nose: Nose normal.     Mouth/Throat:     Mouth: Mucous membranes are moist.     Pharynx: Oropharynx is clear.  Eyes:     General: No scleral icterus.       Right eye: No discharge.        Left eye: No discharge.     Extraocular Movements: Extraocular movements intact.     Conjunctiva/sclera: Conjunctivae normal.     Pupils: Pupils are equal, round, and reactive to light.  Cardiovascular:     Rate and Rhythm: Normal rate and regular rhythm.     Pulses: Normal pulses.     Heart sounds: Normal heart sounds. No murmur heard.  No friction rub. No gallop.   Pulmonary:     Effort: Pulmonary effort is normal. No respiratory distress.     Breath sounds: Normal breath sounds. No stridor. No wheezing, rhonchi or rales.  Chest:     Chest wall: No tenderness.  Musculoskeletal:        General: Normal range of motion.     Cervical back: Normal range of motion and neck supple.  Skin:    General: Skin is warm and dry.     Capillary Refill: Capillary refill takes less than 2 seconds.     Coloration: Skin is  not jaundiced or pale.     Findings: No bruising, erythema, lesion or rash.  Neurological:     General: No focal deficit present.     Mental Status: He is alert and oriented to person, place, and time. Mental status is at baseline.  Psychiatric:        Mood and Affect: Mood normal.        Behavior: Behavior normal.        Thought Content: Thought content normal.        Judgment: Judgment normal.     Results for orders placed or performed in visit on 02/03/20  Comprehensive metabolic panel  Result Value Ref Range   Glucose 105 (H) 65 - 99 mg/dL   BUN 7 (L) 10 - 36 mg/dL   Creatinine, Ser 8.67 0.76 - 1.27 mg/dL   GFR calc non Af Amer 79 >59 mL/min/1.73   GFR calc Af Amer 91 >59 mL/min/1.73   BUN/Creatinine Ratio 9 (L) 10 - 24   Sodium 134 134 - 144 mmol/L   Potassium 4.3 3.5 - 5.2 mmol/L   Chloride 98 96 - 106 mmol/L    CO2 22 20 - 29 mmol/L   Calcium 9.1 8.6 - 10.2 mg/dL   Total Protein 6.4 6.0 - 8.5 g/dL   Albumin 4.4 3.5 - 4.6 g/dL   Globulin, Total 2.0 1.5 - 4.5 g/dL   Albumin/Globulin Ratio 2.2 1.2 - 2.2   Bilirubin Total 0.7 0.0 - 1.2 mg/dL   Alkaline Phosphatase 63 48 - 121 IU/L   AST 26 0 - 40 IU/L   ALT 18 0 - 44 IU/L  Lipid Panel w/o Chol/HDL Ratio  Result Value Ref Range   Cholesterol, Total 105 100 - 199 mg/dL   Triglycerides 544 0 - 149 mg/dL   HDL 45 >92 mg/dL   VLDL Cholesterol Cal 21 5 - 40 mg/dL   LDL Chol Calc (NIH) 39 0 - 99 mg/dL      Assessment & Plan:   Problem List Items Addressed This Visit      Cardiovascular and Mediastinum   Hypertension    Running high today, but under good control at home. Encouraged him to take his lisinopril for kidney protection. He is not sure if he'll pick it up. No other concerns or complaints at this time.       Relevant Medications   rosuvastatin (CRESTOR) 10 MG tablet   lisinopril (ZESTRIL) 5 MG tablet    Other Visit Diagnoses    Need for influenza vaccination    -  Primary   Flu shot given today.    Relevant Orders   Flu Vaccine QUAD High Dose(Fluad) (Completed)       Follow up plan: Return in about 6 months (around 11/05/2020).

## 2020-05-08 NOTE — Patient Instructions (Signed)
Visit Information  Goals Addressed            This Visit's Progress   . RNCM: I have cut back on my salt intake       Current Barriers:  . Chronic Disease Management support, education, and care coordination needs related to CAD, HTN, HLD, and Lymphedema   Clinical Goal(s) related to CAD, HTN, HLD, and Lymphedema :  Over the next 90 days, patient will:  . Work with the care management team to address educational, disease management, and care coordination needs  . Begin or continue self health monitoring activities as directed today Measure and record blood pressure 5 times per week . Call provider office for new or worsened signs and symptoms Blood pressure findings outside established parameters and New or worsened symptom related to chronic health conditions . Call care management team with questions or concerns . Verbalize basic understanding of patient centered plan of care established today  Interventions related to CAD, HTN, HLD, and Lymphedema  :  . Evaluation of current treatment plans and patient's adherence to plan as established by provider.  05-08-2020: The patient verbalized that he is doing well. Blood pressure was a little high in the office, 165/77 and 170/76, but he states at home it is usually around 125/60.  He denies any issues with headache or any other abnormal sx/sx. . Discussed [place] healthy diet- the patient is cutting back on salt.  Advised the patient to rinse canned vegetables and try to stay away from processed foods. The patient cooks for himself and sometimes eats food that his neighbor prepares for him. The patient verbalized that he is doing well with watching his sodium intake and has lost weight because he has noted that his pants are "too big".  He has a scales but does not feel they are accurate because they are very old. 05-08-2020: The patient endorses a heart healthy diet. Is eating fresh vegetables and fruits. Denies any concerns with dietary  restrictions. States he eats very little salt. States his father eat a lot of salt and lived til he was 84.  Education and support given. Denies any issues with following sodium restricted diet.  . Assessed patient understanding of disease states.  The patient has a good working knowledge of his chronic conditions  . Assessed patient's education and care coordination needs.  SDOH reviewed. The patient has no needs at this time. Saw pcp today on 05-08-2020. Next appointment with the pcp on 11-06-2019.  Will continue to monitor for changes.  . Provided disease specific education to patient. Praised for watching sodium intake in diet and improved blood pressures. Education and support given. 05-08-2020: The patient is eating fresh vegetables and has a good appetite. Is monitoring his sodium intake. Review of exercise regimen. The patient walks a mile a day and does anywhere from 25 to 50 pushups. The patient states he doesn't look, act, or feel 86 years old. Nicholas Gross with appropriate clinical care team members regarding patient needs.  Knows about LCSW and pharmacist. Denies any needs at this time.  . Evaluation of blood pressure readings: The patient states his blood pressure has been doing well. Yesterday his blood pressure was 125/60  Will continue to monitor at home. The patient has also increased his activity level.  o BP Readings from Last 3 Encounters: o  05/08/20 o (!) 170/76 o  02/20/20 o 139/69 o  02/03/20 o (!) 170/66 o     . Evaluation of macular  degeneration. The patient states he has gotten to where he can not read and that is a little frustrating. The patient enjoys reading his bible but even finds that hard to do now as his vision is declining. He states that he has a good memory from reading over the years. He also listens to preachers on tv and the radio and gets a lot out of this. The patient talked about his strong faith and how he feels blessed.   Patient Self Care Activities  related to CAD, HTN, HLD, and Lymphedema :  . Patient is unable to independently self-manage chronic health conditions  Please see past updates related to this goal by clicking on the "Past Updates" button in the selected goal         The patient verbalized understanding of instructions, educational materials, and care plan provided today and declined offer to receive copy of patient instructions, educational materials, and care plan.   Telephone follow up appointment with care management team member scheduled for: 07-04-2020 at 11:30 am  [name] RN, MSN, CCM Community Care Coordinator [place]  [facility] Mobile: [phone]

## 2020-05-08 NOTE — Chronic Care Management (AMB) (Signed)
Chronic Care Management   Follow Up Note   05/08/2020 Name: Nicholas Gross MRN: [id number] DOB: 1927/11/15  Referred by: Dorcas Carrow, DO Reason for referral : Chronic Care Management (RNCM Face to Face Appointment with the patient. for Chronic Disease Management and Care Coordination Needs)   Nicholas Gross is a 86 y.o. year old male who is a primary care patient of Dorcas Carrow, DO. The CCM team was consulted for assistance with chronic disease management and care coordination needs.    Review of patient status, including review of consultants reports, relevant laboratory and other test results, and collaboration with appropriate care team members and the patient's provider was performed as part of comprehensive patient evaluation and provision of chronic care management services.    SDOH (Social Determinants of Health) assessments performed: Yes See Care Plan activities for detailed interventions related to [name])     Outpatient Encounter Medications as of 05/08/2020  Medication Sig  . aspirin EC 81 MG tablet Take 1 tablet (81 mg total) by mouth daily.  [name] lisinopril (ZESTRIL) 5 MG tablet Take 1 tablet (5 mg total) by mouth daily.  . Multiple Vitamins-Minerals (PRESERVISION AREDS PO) Take by mouth 2 (two) times daily.  . rosuvastatin (CRESTOR) 10 MG tablet Take 1 tablet (10 mg total) by mouth daily.   No facility-administered encounter medications on file as of 05/08/2020.     Objective:   Goals Addressed            This Visit's Progress   . RNCM: I have cut back on my salt intake       Current Barriers:  . Chronic Disease Management support, education, and care coordination needs related to CAD, HTN, HLD, and Lymphedema   Clinical Goal(s) related to CAD, HTN, HLD, and Lymphedema :  Over the next 90 days, patient will:  . Work with the care management team to address educational, disease management, and care coordination needs  . Begin or continue self  health monitoring activities as directed today Measure and record blood pressure 5 times per week . Call provider office for new or worsened signs and symptoms Blood pressure findings outside established parameters and New or worsened symptom related to chronic health conditions . Call care management team with questions or concerns . Verbalize basic understanding of patient centered plan of care established today  Interventions related to CAD, HTN, HLD, and Lymphedema  :  . Evaluation of current treatment plans and patient's adherence to plan as established by provider.  05-08-2020: The patient verbalized that he is doing well. Blood pressure was a little high in the office, 165/77 and 170/76, but he states at home it is usually around 125/60.  He denies any issues with headache or any other abnormal sx/sx. . Discussed [place] healthy diet- the patient is cutting back on salt.  Advised the patient to rinse canned vegetables and try to stay away from processed foods. The patient cooks for himself and sometimes eats food that his neighbor prepares for him. The patient verbalized that he is doing well with watching his sodium intake and has lost weight because he has noted that his pants are "too big".  He has a scales but does not feel they are accurate because they are very old. 05-08-2020: The patient endorses a heart healthy diet. Is eating fresh vegetables and fruits. Denies any concerns with dietary restrictions. States he eats very little salt. States his father eat a lot of salt and lived  til he was 62.  Education and support given. Denies any issues with following sodium restricted diet.  . Assessed patient understanding of disease states.  The patient has a good working knowledge of his chronic conditions  . Assessed patient's education and care coordination needs.  SDOH reviewed. The patient has no needs at this time. Saw pcp today on 05-08-2020. Next appointment with the pcp on 11-06-2019.  Will  continue to monitor for changes.  . Provided disease specific education to patient. Praised for watching sodium intake in diet and improved blood pressures. Education and support given. 05-08-2020: The patient is eating fresh vegetables and has a good appetite. Is monitoring his sodium intake. Review of exercise regimen. The patient walks a mile a day and does anywhere from 25 to 50 pushups. The patient states he doesn't look, act, or feel 86 years old. [name] with appropriate clinical care team members regarding patient needs.  Knows about LCSW and pharmacist. Denies any needs at this time.  . Evaluation of blood pressure readings: The patient states his blood pressure has been doing well. Yesterday his blood pressure was 125/60  Will continue to monitor at home. The patient has also increased his activity level.  o BP Readings from Last 3 Encounters: o  05/08/20 o (!) 170/76 o  02/20/20 o 139/69 o  02/03/20 o (!) 170/66 o     . Evaluation of macular degeneration. The patient states he has gotten to where he can not read and that is a little frustrating. The patient enjoys reading his bible but even finds that hard to do now as his vision is declining. He states that he has a good memory from reading over the years. He also listens to preachers on tv and the radio and gets a lot out of this. The patient talked about his strong faith and how he feels blessed.   Patient Self Care Activities related to CAD, HTN, HLD, and Lymphedema :  . Patient is unable to independently self-manage chronic health conditions  Please see past updates related to this goal by clicking on the "Past Updates" button in the selected goal          Plan:   Telephone follow up appointment with care management team member scheduled for: 07-04-2020 at 11:30 am   [name] RN, MSN, CCM Community Care Coordinator [place]  [facility] Mobile: [phone]

## 2020-07-04 ENCOUNTER — Telehealth: Payer: Self-pay | Admitting: General Practice

## 2020-07-04 ENCOUNTER — Telehealth: Payer: Self-pay

## 2020-07-04 NOTE — Telephone Encounter (Signed)
  Chronic Care Management   Outreach Note  07/04/2020 Name: Nicholas Gross MRN: [id number] DOB: March 22, 1928  Referred by: Dorcas Carrow, DO Reason for referral : Appointment (RNCM: Follow up Call for Chronic Disease Management and Care Coordination Needs)   An unsuccessful telephone outreach was attempted today. The patient was referred to the case management team for assistance with care management and care coordination.   Follow Up Plan: A HIPAA compliant phone message was left for the patient providing contact information and requesting a return call.   [name] RN, MSN, CCM Community Care Coordinator   [facility] Mobile: [phone]

## 2020-07-15 DIAGNOSIS — I739 Peripheral vascular disease, unspecified: Secondary | ICD-10-CM | POA: Insufficient documentation

## 2020-07-15 NOTE — Progress Notes (Signed)
Cardiology Office Note  Date:  07/16/2020   ID:  Nicholas Gross, DOB 1927-11-06, MRN [id number]  PCP:  Dorcas Carrow, DO   Chief Complaint  Patient presents with  . New Patient (Initial Visit)    Self referral - Patient c.o SOB and HTN. Meds reviewed verbally with patient.     HPI:  Nicholas Gross is a 87 y.o.male patient with PMH of   1. Cypher stent mid LAD 09/12/2002, "95 percent blocked" 2. Essential hypertension 3. Hyperlipidemia 4. Left carotid endarterectomy 07/10/2008 5. Former smoker 1952 - 11/69 Who presents for new patient evaluation for CAD, PAD  [facility]  Active Tries to walk 1 mile a day, has a dog, walks to the [employer] care of his property No chest pain, no angina, denies SOB  No recent carotid u/s, or LE arterial u/s Denies claudication  Itching after vaccine booster (shot x3) Might be dry skin  Lab Results  Component Value Date   CHOL 105 02/03/2020   HDL 45 02/03/2020   LDLCALC 39 02/03/2020   TRIG 118 02/03/2020     PMH:   has a past medical history of CAD (coronary artery disease), History of GI bleed (2008), History of tobacco use, Hyperlipidemia, Hypertension, and Overweight.  PSH:    Past Surgical History:  Procedure Laterality Date  . CAROTID ENDARTERECTOMY Left 2010  . CATARACT EXTRACTION    . cypher stent  09/12/02   s/p cypher stent mid-LAD  . TENDON REPAIR  1991   finger and arm    Current Outpatient Medications  Medication Sig Dispense Refill  . aspirin EC 81 MG tablet Take 1 tablet (81 mg total) by mouth daily. 100 tablet 12  . Multiple Vitamins-Minerals (PRESERVISION AREDS PO) Take by mouth 2 (two) times daily.    . rosuvastatin (CRESTOR) 10 MG tablet Take 1 tablet (10 mg total) by mouth daily. 90 tablet 1   No current facility-administered medications for this visit.     Allergies:   Other and Simvastatin   Social History:  The patient  reports that he quit smoking about 52  years ago. His smoking use included cigarettes. He has a 17.00 pack-year smoking history. He has never used smokeless tobacco. He reports that he does not drink alcohol and does not use drugs.   Family History:   family history includes AAA (abdominal aortic aneurysm) in his brother; Cancer in his sister and sister; Heart disease in his father.    Review of Systems: Review of Systems  Constitutional: Negative.   HENT: Negative.   Respiratory: Negative.   Cardiovascular: Negative.   Gastrointestinal: Negative.   Musculoskeletal: Negative.   Neurological: Negative.   Psychiatric/Behavioral: Negative.   All other systems reviewed and are negative.    PHYSICAL EXAM: VS:  BP 140/88 (BP Location: Right Arm, Patient Position: Sitting, Cuff Size: Normal)   Pulse (!) 58   Ht 5\' 9"  (1.753 m)   Wt 178 lb (80.7 kg)   SpO2 97%   BMI 26.29 kg/m  , BMI Body mass index is 26.29 kg/m. GEN: Well nourished, well developed, in no acute distress HEENT: normal Neck: no JVD, minimal carotid bruit on the left carotid bruits Cardiac: RRR; no murmurs, rubs, or gallops,no edema  Decreased but appreciable pulses bilateral lower extremities PT Respiratory:  clear to auscultation bilaterally, normal work of breathing GI: soft, nontender, nondistended, + BS MS: no deformity or atrophy Skin: warm and dry, no rash Neuro:  Strength and sensation are intact Psych: euthymic mood, full affect   Recent Labs: 11/03/2019: Hemoglobin 13.9; Platelets 212 02/03/2020: ALT 18; BUN 7; Creatinine, Ser 0.80; Potassium 4.3; Sodium 134    Lipid Panel Lab Results  Component Value Date   CHOL 105 02/03/2020   HDL 45 02/03/2020   LDLCALC 39 02/03/2020   TRIG 118 02/03/2020      Wt Readings from Last 3 Encounters:  07/16/20 178 lb (80.7 kg)  05/08/20 173 lb 9.6 oz (78.7 kg)  02/03/20 173 lb 6.4 oz (78.7 kg)       ASSESSMENT AND PLAN:  Problem List Items Addressed This Visit      Cardiology Problems    PAD (peripheral artery disease) (HCC)   Hypertension   Relevant Orders   EKG 12-Lead   Hyperlipemia   Carotid atherosclerosis   CAD (coronary artery disease) - Primary   Relevant Orders   EKG 12-Lead     Other   Lymphedema     Coronary disease with stable angina Currently with no symptoms of angina. No further workup at this time. Continue current medication regimen. Declining any testing  PAD/carotid stenosis Prior carotid endarterectomy on the left Cholesterol at goal, non-smoker, no diabetes Does not want ultrasound -Unclear if he has worsening arterial disease, declining lower extremity ABIs and ultrasound  Leg swelling Likely component of dependent edema Minimal on exam, recommended rest socks Previously declined lymphedema, do not think this is needed as symptoms are minimal  Hyperlipidemia Numbers at goal Recommend he stay on the Crestor  Skin itching Suspect dry skin, heat rash, recommended moisturizer, humidifier   Disposition:   F/U  12 months   Total encounter time more than 45 minutes  Greater than 50% was spent in counseling and coordination of care with the patient    Signed, Dossie Arbour, M.D., Ph.D. [facility] [place], [place] [phone]

## 2020-07-16 ENCOUNTER — Ambulatory Visit: Payer: Medicare Other | Admitting: Cardiovascular Disease

## 2020-07-16 ENCOUNTER — Other Ambulatory Visit: Payer: Self-pay

## 2020-07-16 ENCOUNTER — Encounter: Payer: Self-pay | Admitting: Cardiovascular Disease

## 2020-07-16 VITALS — BP 140/88 | HR 58 | Ht 69.0 in | Wt 178.0 lb

## 2020-07-16 DIAGNOSIS — I25118 Atherosclerotic heart disease of native coronary artery with other forms of angina pectoris: Secondary | ICD-10-CM

## 2020-07-16 DIAGNOSIS — I6529 Occlusion and stenosis of unspecified carotid artery: Secondary | ICD-10-CM | POA: Diagnosis not present

## 2020-07-16 DIAGNOSIS — I890 Lymphedema, not elsewhere classified: Secondary | ICD-10-CM | POA: Diagnosis not present

## 2020-07-16 DIAGNOSIS — I10 Essential (primary) hypertension: Secondary | ICD-10-CM | POA: Diagnosis not present

## 2020-07-16 DIAGNOSIS — E782 Mixed hyperlipidemia: Secondary | ICD-10-CM

## 2020-07-16 DIAGNOSIS — I739 Peripheral vascular disease, unspecified: Secondary | ICD-10-CM

## 2020-07-16 NOTE — Patient Instructions (Signed)
Medication Instructions:  No changes  If you need a refill on your cardiac medications before your next appointment, please call your pharmacy.    Lab work: No new labs needed   If you have labs (blood work) drawn today and your tests are completely normal, you will receive your results only by: . MyChart Message (if you have MyChart) OR . A paper copy in the mail If you have any lab test that is abnormal or we need to change your treatment, we will call you to review the results.   Testing/Procedures: No new testing needed   Follow-Up: At [facility], you and your health needs are our priority.  As part of our continuing mission to provide you with exceptional heart care, we have created designated Provider Care Teams.  These Care Teams include your primary Cardiologist (physician) and Advanced Practice Providers (APPs -  Physician Assistants and Nurse Practitioners) who all work together to provide you with the care you need, when you need it.  . You will need a follow up appointment in 12 months  . Providers on your designated Care Team:   . Christopher Berge, NP . Ryan Dunn, PA-C . Jacquelyn Visser, PA-C  Any Other Special Instructions Will Be Listed Below (If Applicable).  COVID-19 Vaccine Information can be found at: [email or web address] For questions related to vaccine distribution or appointments, please email [email or web address] or call [phone].     

## 2020-07-30 NOTE — Telephone Encounter (Signed)
Patient has been rescheduled.

## 2020-09-04 ENCOUNTER — Telehealth: Payer: Medicare Other

## 2020-09-04 ENCOUNTER — Telehealth: Payer: Self-pay

## 2020-09-04 NOTE — Chronic Care Management (AMB) (Signed)
  Care Management   Note  09/04/2020 Name: Nicholas Gross MRN: [id number] DOB: [birth year]  Nicholas Gross is a 87 y.o. year old male who is a primary care patient of Dorcas Carrow, DO and is actively engaged with the care management team. I reached out to [name] by phone today to assist with re-scheduling a follow up visit with the RN Case Manager  Follow up plan: Unsuccessful telephone outreach attempt made. A HIPAA compliant phone message was left for the patient providing contact information and requesting a return call.  The care management team will reach out to the patient again over the next 7 days.  If patient returns call to provider office, please advise to call Embedded Care Management Care Guide Penne Lash  at [phone]  Penne Lash, RMA Care Guide, Embedded Care Coordination [facility]  [place], [place] [address] Direct Dial: [phone] [email or web address]: [email or web address]

## 2020-09-19 NOTE — Chronic Care Management (AMB) (Signed)
  Care Management   Note  09/19/2020 Name: YUJI WALTH MRN: [id number] DOB: June 02, 1928  RAHN LACUESTA is a 87 y.o. year old male who is a primary care patient of Dorcas Carrow, DO and is actively engaged with the care management team. I reached out to [name] by phone today to assist with re-scheduling a follow up visit with the RN Case Manager  Follow up plan: Telephone appointment with care management team member scheduled for:10/30/2020  Penne Lash, RMA Care Guide, Embedded Care Coordination [facility]  [place], [place] [address] Direct Dial: [phone] [email or web address]: [email or web address]

## 2020-10-22 ENCOUNTER — Ambulatory Visit: Payer: Medicare Other

## 2020-10-22 ENCOUNTER — Telehealth: Payer: Self-pay

## 2020-10-22 NOTE — Telephone Encounter (Signed)
This nurse attempted to call patient in order to perform scheduled telephonic AWV. Message was left that I would call back in five minutes. In the mean time I received a message from the office that the patient had arrived in house for his visit. He is unable to get home in order to have his visit and does not appear to have a cell phone.

## 2020-10-30 ENCOUNTER — Ambulatory Visit (INDEPENDENT_AMBULATORY_CARE_PROVIDER_SITE_OTHER): Payer: Medicare Other | Admitting: General Practice

## 2020-10-30 ENCOUNTER — Telehealth: Payer: Medicare Other | Admitting: General Practice

## 2020-10-30 DIAGNOSIS — I10 Essential (primary) hypertension: Secondary | ICD-10-CM | POA: Diagnosis not present

## 2020-10-30 DIAGNOSIS — E782 Mixed hyperlipidemia: Secondary | ICD-10-CM

## 2020-10-30 DIAGNOSIS — I2510 Atherosclerotic heart disease of native coronary artery without angina pectoris: Secondary | ICD-10-CM

## 2020-10-30 NOTE — Chronic Care Management (AMB) (Signed)
Chronic Care Management   CCM RN Visit Note  10/30/2020 Name: Nicholas Gross MRN: [id number] DOB: [birth year]  Subjective: Nicholas Gross is a 87 y.o. year old male who is a primary care patient of [name], DO. The care management team was consulted for assistance with disease management and care coordination needs.    Engaged with patient by telephone for follow up visit in response to provider referral for case management and/or care coordination services.   Consent to Services:  The patient was given information about Chronic Care Management services, agreed to services, and gave verbal consent prior to initiation of services.  Please see initial visit note for detailed documentation.   Patient agreed to services and verbal consent obtained.   Assessment: Review of patient past medical history, allergies, medications, health status, including review of consultants reports, laboratory and other test data, was performed as part of comprehensive evaluation and provision of chronic care management services.   SDOH (Social Determinants of Health) assessments and interventions performed:  SDOH Interventions   Flowsheet Row Most Recent Value  SDOH Interventions   Social Connections Interventions Other (Comment)  [has good support system and neighbors that check on him on a regular basis]       CCM Care Plan  Allergies  Allergen Reactions  . Other     Patient states that he is allergic to something that they place in the IV before they work on you  . Simvastatin Other (See Comments)    Myalgia     Outpatient Encounter Medications as of 10/30/2020  Medication Sig  . aspirin EC 81 MG tablet Take 1 tablet (81 mg total) by mouth daily.  . Multiple Vitamins-Minerals (PRESERVISION AREDS PO) Take by mouth 2 (two) times daily.  . rosuvastatin (CRESTOR) 10 MG tablet Take 1 tablet (10 mg total) by mouth daily.   No facility-administered encounter medications on file as of  10/30/2020.    Patient Active Problem List   Diagnosis Date Noted  . PAD (peripheral artery disease) ([place]) 07/15/2020  . Lymphedema 05/06/2019  . Swelling of limb 03/25/2019  . Hyperlipemia 02/13/2016  . Macular degeneration 08/15/2015  . Carotid atherosclerosis 08/15/2015  . Allergic rhinitis due to pollen 02/12/2015  . Hypertension   . CAD (coronary artery disease)   . Overweight   . H/O cardiac catheterization 06/12/2014  . History of left-sided carotid endarterectomy 06/12/2014    Conditions to be addressed/monitored:CAD, HTN and HLD  Care Plan : RNCM: Hypertension (Adult)  Updates made by Vanita Ingles since 10/30/2020 12:00 AM    Problem: RNCM; Hypertension (Hypertension)   Priority: Medium    Long-Range Goal: RNCM: Hypertension Monitored   Priority: Medium  Note:   Objective:  . Last practice recorded BP readings:  BP Readings from Last 3 Encounters:  07/16/20 140/88  05/08/20 (!) 170/76  02/20/20 139/69 .   [name] Most recent eGFR/CrCl: No results found for: EGFR  No components found for: CRCL Current Barriers:  [name] Knowledge Deficits related to basic understanding of hypertension pathophysiology and self care management . Knowledge Deficits related to understanding of medications prescribed for management of hypertension . Limited Social Support . Lacks social connections . Does not contact provider office for questions/concerns Case Manager Clinical Goal(s):  . patient will verbalize understanding of plan for hypertension management . patient will attend all scheduled medical appointments: 11-05-2020 at 1020 am . patient will demonstrate improved adherence to prescribed treatment plan for hypertension as evidenced by taking  all medications as prescribed, monitoring and recording blood pressure as directed, adhering to low sodium/DASH diet . patient will demonstrate improved health management independence as evidenced by checking blood pressure as directed and  notifying PCP if SBP>160 or DBP > 90, taking all medications as prescribe, and adhering to a low sodium diet as discussed. . patient will verbalize basic understanding of hypertension disease process and self health management plan as evidenced by compliance with medications, compliance with heart healthy diet, and working with the CCM team to manage  health and well being.  Interventions:  . Collaboration with [name], DO regarding development and update of comprehensive plan of care as evidenced by provider attestation and co-signature . Inter-disciplinary care team collaboration (see longitudinal plan of care) . Evaluation of current treatment plan related to hypertension self management and patient's adherence to plan as established by provider. . Provided education to patient re: stroke prevention, s/s of heart attack and stroke, DASH diet, complications of uncontrolled blood pressure . Reviewed medications with patient and discussed importance of compliance . Discussed plans with patient for ongoing care management follow up and provided patient with direct contact information for care management team . Advised patient, providing education and rationale, to monitor blood pressure daily and record, calling PCP for findings outside established parameters.  . Reviewed scheduled/upcoming provider appointments including: 11-05-2020 at 1020 am Self-Care Activities: - Self administers medications as prescribed Attends all scheduled provider appointments Calls provider office for new concerns, questions, or BP outside discussed parameters Checks BP and records as discussed Follows a low sodium diet/DASH diet Patient Goals: - check blood pressure weekly - choose a place to take my blood pressure (home, clinic or office, retail store) - write blood pressure results in a log or diary - agree on reward when goals are met - agree to work together to make changes - ask questions to  understand - have a family meeting to talk about healthy habits - learn about high blood pressure - blood pressure trends reviewed - depression screen reviewed - home or ambulatory blood pressure monitoring encouraged Follow Up Plan: Telephone follow up appointment with care management team member scheduled for: 01-01-2021 at 0900 am   Task: RNCM; Identify and Monitor Blood Pressure Elevation   Note:   Care Management Activities:    - blood pressure trends reviewed - depression screen reviewed - home or ambulatory blood pressure monitoring encouraged       Care Plan : RNCM; Coronary Artery Disease (Adult) and HLD  Updates made by Vanita Ingles since 10/30/2020 12:00 AM    Problem: RNCM: Disease Progression (Coronary Artery Disease) and HLD   Priority: Medium    Long-Range Goal: RNCM: Disease Progression Prevented or Minimized and HLD   Priority: Medium  Note:   Current Barriers:  . Poorly controlled hyperlipidemia, complicated by HTN . Current antihyperlipidemic regimen: crestor 10 mg QD . Most recent lipid panel:     Component Value Date/Time   CHOL 105 02/03/2020 1341   CHOL 123 02/13/2016 0834   TRIG 118 02/03/2020 1341   TRIG 114 02/13/2016 0834   HDL 45 02/03/2020 1341   VLDL 23 02/13/2016 0834   LDLCALC 39 02/03/2020 1341 .   [name] ASCVD risk enhancing conditions: age >91, HTN,  former smoker . Lacks Microbiologist Clinical Goal(s):  . patient will work with Consulting civil engineer, providers, and care team towards execution of optimized self-health management plan . patient will verbalize understanding  of plan for effective management of HLD and CAD . patient will work with [facility] and pcp to address needs related to management of HLD and CAD . patient will attend all scheduled medical appointments: 11-05-2020 at 10:20 am Interventions: . Collaboration with [name], DO regarding development and update of comprehensive plan of care as evidenced by  provider attestation and co-signature . Inter-disciplinary care team collaboration (see longitudinal plan of care) . Medication review performed; medication list updated in electronic medical record.  Bertram Savin care team collaboration (see longitudinal plan of care) . Referred to pharmacy team for assistance with HLD medication management . Evaluation of current treatment plan related to HLD and CAD and patient's adherence to plan as established by provider. . Advised patient to call the office for changes or questions, also reminded the patient of his appointment on 11-05-2020 at 10:20 am with Dr. Wynetta Emery.  . Provided education to patient re: to continue with heart healthy diet, current exercise regimen, and healthy habits.  . Reviewed scheduled/upcoming provider appointments including: 11-05-2020 at 10:20 am . Discussed plans with patient for ongoing care management follow up and provided patient with direct contact information for care management team Patient Goals/Self-Care Activities: - call for medicine refill 2 or 3 days before it runs out - call if I am sick and can't take my medicine - keep a list of all the medicines I take; vitamins and herbals too - learn to read medicine labels - use a pillbox to sort medicine - use an alarm clock or phone to remind me to take my medicine - change to whole grain breads, cereal, pasta - drink 6 to 8 glasses of water each day - eat 3 to 5 servings of fruits and vegetables each day - eat 5 or 6 small meals each day - eat fish at least once per week - fill half the plate with nonstarchy vegetables - limit fast food meals to no more than 1 per week - manage portion size - prepare main meal at home 3 to 5 days each week - read food labels for fat, fiber, carbohydrates and portion size - be open to making changes - I can manage, know and watch for signs of a heart attack - if I have chest pain, call for help - learn about small changes  that will make a big difference - learn my personal risk factors  - barriers to treatment adherence reviewed and addressed - functional limitation screening reviewed - healthy lifestyle promoted - medication-adherence assessment completed - rescue (action) plan developed - response to pharmacologic therapy monitored - self-awareness of signs/symptoms of worsening disease encouraged Follow Up Plan:  Telephone follow up appointment with care management team member scheduled for: 01-01-2021 at 0900 am      Task: RNCM: Alleviate Barriers to Coronary Artery Disease Therapy and HLD   Note:   Care Management Activities:    - barriers to treatment adherence reviewed and addressed - functional limitation screening reviewed - healthy lifestyle promoted - medication-adherence assessment completed - rescue (action) plan developed - response to pharmacologic therapy monitored - self-awareness of signs/symptoms of worsening disease encouraged          Plan:Telephone follow up appointment with care management team member scheduled for:  01-01-2021 at 0900 am  Noreene Larsson RN, MSN, [facility] Mobile: [phone]

## 2020-10-30 NOTE — Patient Instructions (Signed)
Visit Information  PATIENT GOALS: Goals Addressed            This Visit's Progress   . COMPLETED: RNCM: I have cut back on my salt intake       Current Barriers: Closing this goal and opening new ELS . Chronic Disease Management support, education, and care coordination needs related to CAD, HTN, HLD, and Lymphedema   Clinical Goal(s) related to CAD, HTN, HLD, and Lymphedema :  Over the next 90 days, patient will:  . Work with the care management team to address educational, disease management, and care coordination needs  . Begin or continue self health monitoring activities as directed today Measure and record blood pressure 5 times per week . Call provider office for new or worsened signs and symptoms Blood pressure findings outside established parameters and New or worsened symptom related to chronic health conditions . Call care management team with questions or concerns . Verbalize basic understanding of patient centered plan of care established today  Interventions related to CAD, HTN, HLD, and Lymphedema  :  . Evaluation of current treatment plans and patient's adherence to plan as established by provider.  05-08-2020: The patient verbalized that he is doing well. Blood pressure was a little high in the office, 165/77 and 170/76, but he states at home it is usually around 125/60.  He denies any issues with headache or any other abnormal sx/sx. . Discussed [place] healthy diet- the patient is cutting back on salt.  Advised the patient to rinse canned vegetables and try to stay away from processed foods. The patient cooks for himself and sometimes eats food that his neighbor prepares for him. The patient verbalized that he is doing well with watching his sodium intake and has lost weight because he has noted that his pants are "too big".  He has a scales but does not feel they are accurate because they are very old. 05-08-2020: The patient endorses a heart healthy diet. Is eating fresh  vegetables and fruits. Denies any concerns with dietary restrictions. States he eats very little salt. States his father eat a lot of salt and lived til he was 71.  Education and support given. Denies any issues with following sodium restricted diet.  . Assessed patient understanding of disease states.  The patient has a good working knowledge of his chronic conditions  . Assessed patient's education and care coordination needs.  SDOH reviewed. The patient has no needs at this time. Saw pcp today on 05-08-2020. Next appointment with the pcp on 11-06-2019.  Will continue to monitor for changes.  . Provided disease specific education to patient. Praised for watching sodium intake in diet and improved blood pressures. Education and support given. 05-08-2020: The patient is eating fresh vegetables and has a good appetite. Is monitoring his sodium intake. Review of exercise regimen. The patient walks a mile a day and does anywhere from 25 to 50 pushups. The patient states he doesn't look, act, or feel 87 years old. Nash Dimmer with appropriate clinical care team members regarding patient needs.  Knows about LCSW and pharmacist. Denies any needs at this time.  . Evaluation of blood pressure readings: The patient states his blood pressure has been doing well. Yesterday his blood pressure was 125/60  Will continue to monitor at home. The patient has also increased his activity level.  o BP Readings from Last 3 Encounters: o  05/08/20 o (!) 170/76 o  02/20/20 o 139/69 o  02/03/20 o (!) 170/66  o     . Evaluation of macular degeneration. The patient states he has gotten to where he can not read and that is a little frustrating. The patient enjoys reading his bible but even finds that hard to do now as his vision is declining. He states that he has a good memory from reading over the years. He also listens to preachers on tv and the radio and gets a lot out of this. The patient talked about his strong faith and how  he feels blessed.   Patient Self Care Activities related to CAD, HTN, HLD, and Lymphedema :  . Patient is unable to independently self-manage chronic health conditions  Please see past updates related to this goal by clicking on the "Past Updates" button in the selected goal        Patient Care Plan: RNCM: Hypertension (Adult)    Problem Identified: RNCM; Hypertension (Hypertension)   Priority: Medium    Long-Range Goal: RNCM: Hypertension Monitored   Priority: Medium  Note:   Objective:  . Last practice recorded BP readings:  BP Readings from Last 3 Encounters:  07/16/20 140/88  05/08/20 (!) 170/76  02/20/20 139/69 .   [name] Most recent eGFR/CrCl: No results found for: EGFR  No components found for: CRCL Current Barriers:  [name] Knowledge Deficits related to basic understanding of hypertension pathophysiology and self care management . Knowledge Deficits related to understanding of medications prescribed for management of hypertension . Limited Social Support . Lacks social connections . Does not contact provider office for questions/concerns Case Manager Clinical Goal(s):  . patient will verbalize understanding of plan for hypertension management . patient will attend all scheduled medical appointments: 11-05-2020 at 1020 am . patient will demonstrate improved adherence to prescribed treatment plan for hypertension as evidenced by taking all medications as prescribed, monitoring and recording blood pressure as directed, adhering to low sodium/DASH diet . patient will demonstrate improved health management independence as evidenced by checking blood pressure as directed and notifying PCP if SBP>160 or DBP > 90, taking all medications as prescribe, and adhering to a low sodium diet as discussed. . patient will verbalize basic understanding of hypertension disease process and self health management plan as evidenced by compliance with medications, compliance with heart healthy diet, and  working with the CCM team to manage  health and well being.  Interventions:  . Collaboration with [name], DO regarding development and update of comprehensive plan of care as evidenced by provider attestation and co-signature . Inter-disciplinary care team collaboration (see longitudinal plan of care) . Evaluation of current treatment plan related to hypertension self management and patient's adherence to plan as established by provider. . Provided education to patient re: stroke prevention, s/s of heart attack and stroke, DASH diet, complications of uncontrolled blood pressure . Reviewed medications with patient and discussed importance of compliance . Discussed plans with patient for ongoing care management follow up and provided patient with direct contact information for care management team . Advised patient, providing education and rationale, to monitor blood pressure daily and record, calling PCP for findings outside established parameters.  . Reviewed scheduled/upcoming provider appointments including: 11-05-2020 at 1020 am Self-Care Activities: - Self administers medications as prescribed Attends all scheduled provider appointments Calls provider office for new concerns, questions, or BP outside discussed parameters Checks BP and records as discussed Follows a low sodium diet/DASH diet Patient Goals: - check blood pressure weekly - choose a place to take my blood pressure (home, clinic or  office, retail store) - write blood pressure results in a log or diary - agree on reward when goals are met - agree to work together to make changes - ask questions to understand - have a family meeting to talk about healthy habits - learn about high blood pressure - blood pressure trends reviewed - depression screen reviewed - home or ambulatory blood pressure monitoring encouraged Follow Up Plan: Telephone follow up appointment with care management team member scheduled for: 01-01-2021  at 0900 am   Task: RNCM; Identify and Monitor Blood Pressure Elevation   Note:   Care Management Activities:    - blood pressure trends reviewed - depression screen reviewed - home or ambulatory blood pressure monitoring encouraged       Patient Care Plan: RNCM; Coronary Artery Disease (Adult) and HLD    Problem Identified: RNCM: Disease Progression (Coronary Artery Disease) and HLD   Priority: Medium    Long-Range Goal: RNCM: Disease Progression Prevented or Minimized and HLD   Priority: Medium  Note:   Current Barriers:  . Poorly controlled hyperlipidemia, complicated by HTN . Current antihyperlipidemic regimen: crestor 10 mg QD . Most recent lipid panel:     Component Value Date/Time   CHOL 105 02/03/2020 1341   CHOL 123 02/13/2016 0834   TRIG 118 02/03/2020 1341   TRIG 114 02/13/2016 0834   HDL 45 02/03/2020 1341   VLDL 23 02/13/2016 0834   LDLCALC 39 02/03/2020 1341 .   [name] ASCVD risk enhancing conditions: age >58, HTN,  former smoker . Lacks Microbiologist Clinical Goal(s):  . patient will work with Consulting civil engineer, providers, and care team towards execution of optimized self-health management plan . patient will verbalize understanding of plan for effective management of HLD and CAD . patient will work with [facility] and pcp to address needs related to management of HLD and CAD . patient will attend all scheduled medical appointments: 11-05-2020 at 10:20 am Interventions: . Collaboration with [name], DO regarding development and update of comprehensive plan of care as evidenced by provider attestation and co-signature . Inter-disciplinary care team collaboration (see longitudinal plan of care) . Medication review performed; medication list updated in electronic medical record.  Bertram Savin care team collaboration (see longitudinal plan of care) . Referred to pharmacy team for assistance with HLD medication management . Evaluation of  current treatment plan related to HLD and CAD and patient's adherence to plan as established by provider. . Advised patient to call the office for changes or questions, also reminded the patient of his appointment on 11-05-2020 at 10:20 am with Dr. Wynetta Emery.  . Provided education to patient re: to continue with heart healthy diet, current exercise regimen, and healthy habits.  . Reviewed scheduled/upcoming provider appointments including: 11-05-2020 at 10:20 am . Discussed plans with patient for ongoing care management follow up and provided patient with direct contact information for care management team Patient Goals/Self-Care Activities: - call for medicine refill 2 or 3 days before it runs out - call if I am sick and can't take my medicine - keep a list of all the medicines I take; vitamins and herbals too - learn to read medicine labels - use a pillbox to sort medicine - use an alarm clock or phone to remind me to take my medicine - change to whole grain breads, cereal, pasta - drink 6 to 8 glasses of water each day - eat 3 to 5 servings of fruits and vegetables each  day - eat 5 or 6 small meals each day - eat fish at least once per week - fill half the plate with nonstarchy vegetables - limit fast food meals to no more than 1 per week - manage portion size - prepare main meal at home 3 to 5 days each week - read food labels for fat, fiber, carbohydrates and portion size - be open to making changes - I can manage, know and watch for signs of a heart attack - if I have chest pain, call for help - learn about small changes that will make a big difference - learn my personal risk factors  - barriers to treatment adherence reviewed and addressed - functional limitation screening reviewed - healthy lifestyle promoted - medication-adherence assessment completed - rescue (action) plan developed - response to pharmacologic therapy monitored - self-awareness of signs/symptoms of worsening  disease encouraged Follow Up Plan:  Telephone follow up appointment with care management team member scheduled for: 01-01-2021 at 0900 am      Task: RNCM: Alleviate Barriers to Coronary Artery Disease Therapy and HLD   Note:   Care Management Activities:    - barriers to treatment adherence reviewed and addressed - functional limitation screening reviewed - healthy lifestyle promoted - medication-adherence assessment completed - rescue (action) plan developed - response to pharmacologic therapy monitored - self-awareness of signs/symptoms of worsening disease encouraged          The patient verbalized understanding of instructions, educational materials, and care plan provided today and declined offer to receive copy of patient instructions, educational materials, and care plan.   Telephone follow up appointment with care management team member scheduled for: 01-01-2021 at 0900 am  Noreene Larsson RN, MSN, [facility] Mobile: [phone]

## 2020-11-05 ENCOUNTER — Encounter: Payer: Self-pay | Admitting: Family Medicine

## 2020-11-05 ENCOUNTER — Other Ambulatory Visit: Payer: Self-pay

## 2020-11-05 ENCOUNTER — Ambulatory Visit (INDEPENDENT_AMBULATORY_CARE_PROVIDER_SITE_OTHER): Payer: Medicare Other | Admitting: Family Medicine

## 2020-11-05 VITALS — BP 169/77 | HR 57 | Temp 98.1°F | Wt 173.0 lb

## 2020-11-05 DIAGNOSIS — Z0000 Encounter for general adult medical examination without abnormal findings: Secondary | ICD-10-CM | POA: Diagnosis not present

## 2020-11-05 DIAGNOSIS — E782 Mixed hyperlipidemia: Secondary | ICD-10-CM | POA: Diagnosis not present

## 2020-11-05 DIAGNOSIS — I10 Essential (primary) hypertension: Secondary | ICD-10-CM

## 2020-11-05 LAB — MICROALBUMIN, URINE WAIVED
Creatinine, Urine Waived: 50 mg/dL (ref 10–300)
Microalb, Ur Waived: 10 mg/L (ref 0–19)
Microalb/Creat Ratio: <30 mg/g (ref <30)

## 2020-11-05 MED ORDER — ROSUVASTATIN CALCIUM 10 MG PO TABS
10.0000 mg | ORAL_TABLET | Freq: Every day | ORAL | 1 refills | Status: DC
Start: 2020-11-05 — End: 2021-05-10

## 2020-11-05 MED ORDER — ASPIRIN EC 81 MG PO TBEC
81.0000 mg | DELAYED_RELEASE_TABLET | Freq: Every day | ORAL | 12 refills | Status: DC
Start: 2020-11-05 — End: 2021-11-08

## 2020-11-05 NOTE — Assessment & Plan Note (Signed)
Runs good at home. Declines medication. Continue to monitor.

## 2020-11-05 NOTE — Assessment & Plan Note (Signed)
Under good control on current regimen. Continue current regimen. Continue to monitor. Call with any concerns. Refills given. Labs drawn today.   

## 2020-11-05 NOTE — Progress Notes (Signed)
BP (!) 169/77 (BP Location: Right Arm, Cuff Size: Normal)   Pulse (!) 57   Temp 98.1 F (36.7 C) (Oral)   Wt 173 lb (78.5 kg)   SpO2 99%   BMI 25.55 kg/m    Subjective:    Patient ID: Nicholas Gross, male    DOB: 07/29/1927, 87 y.o.   MRN: [id number]  HPI: Nicholas Gross is a 87 y.o. male presenting on 11/05/2020 for comprehensive medical examination. Current medical complaints include:  HYPERTENSION / HYPERLIPIDEMIA Satisfied with current treatment? yes Duration of hypertension: chronic BP monitoring frequency: not checking BP medication side effects: declines medication Past BP meds: lisinopril Duration of hyperlipidemia: chronic Cholesterol medication side effects: no Cholesterol supplements: none Past cholesterol medications: crestor Medication compliance: excellent compliance Aspirin: yes Recent stressors: no Recurrent headaches: no Visual changes: no Palpitations: no Dyspnea: no Chest pain: no Lower extremity edema: no Dizzy/lightheaded: no  He currently lives with: alone Interim Problems from his last visit: no  Depression Screen done today and results listed below:  Depression screen [facility] 2/9 10/30/2020 10/19/2019 08/09/2019 08/30/2018 08/26/2017  Decreased Interest 0 0 0 0 0  Down, Depressed, Hopeless 0 0 0 0 0  PHQ - 2 Score 0 0 0 0 0  Altered sleeping - - - 0 0  Tired, decreased energy - - - 0 0  Change in appetite - - - 0 0  Feeling bad or failure about yourself  - - - 0 0  Trouble concentrating - - - 0 1  Moving slowly or fidgety/restless - - - 0 0  Suicidal thoughts - - - 0 0  PHQ-9 Score - - - 0 1  Difficult doing work/chores - - - Not difficult at all Not difficult at all    Past Medical History:  Past Medical History:  Diagnosis Date  . CAD (coronary artery disease)   . History of GI bleed 2008  . History of tobacco use    17 pack years, quit around 1970  . Hyperlipidemia   . Hypertension   . Overweight     Surgical History:  Past  Surgical History:  Procedure Laterality Date  . CAROTID ENDARTERECTOMY Left 2010  . CATARACT EXTRACTION    . cypher stent  09/12/02   s/p cypher stent mid-LAD  . TENDON REPAIR  1991   finger and arm    Medications:  Current Outpatient Medications on File Prior to Visit  Medication Sig  . Multiple Vitamins-Minerals (PRESERVISION AREDS PO) Take by mouth 2 (two) times daily.   No current facility-administered medications on file prior to visit.    Allergies:  Allergies  Allergen Reactions  . Other     Patient states that he is allergic to something that they place in the IV before they work on you  . Simvastatin Other (See Comments)    Myalgia     Social History:  Social History   Socioeconomic History  . Marital status: Widowed    Spouse name: Not on file  . Number of children: Not on file  . Years of education: some college in [employer]   . Highest education level: High school graduate  Occupational History  . Occupation: retired   Tobacco Use  . Smoking status: Former Smoker    Packs/day: 1.00    Years: 17.00    Pack years: 17.00    Types: Cigarettes    Quit date: 06/23/1968    Years since quitting: 52.4  . Smokeless tobacco:  Never Used  Vaping Use  . Vaping Use: Never used  Substance and Sexual Activity  . Alcohol use: No  . Drug use: No  . Sexual activity: Not on file  Other Topics Concern  . Not on file  Social History Narrative   Attends church, neighbor take him    Social Determinants of Health   Financial Resource Strain: Low Risk   . Difficulty of Paying Living Expenses: Not hard at all  Food Insecurity: No Food Insecurity  . Worried About Programme researcher, broadcasting/film/video in the Last Year: Never true  . Ran Out of Food in the Last Year: Never true  Transportation Needs: No Transportation Needs  . Lack of Transportation (Medical): No  . Lack of Transportation (Non-Medical): No  Physical Activity: Sufficiently Active  . Days of Exercise per Week: 6 days  .  Minutes of Exercise per Session: 40 min  Stress: No Stress Concern Present  . Feeling of Stress : Not at all  Social Connections: Socially Isolated  . Frequency of Communication with Friends and Family: Once a week  . Frequency of Social Gatherings with Friends and Family: Once a week  . Attends Religious Services: More than 4 times per year  . Active Member of Clubs or Organizations: No  . Attends Banker Meetings: Never  . Marital Status: Widowed  Intimate Partner Violence: Not At Risk  . Fear of Current or Ex-Partner: No  . Emotionally Abused: No  . Physically Abused: No  . Sexually Abused: No   Social History   Tobacco Use  Smoking Status Former Smoker  . Packs/day: 1.00  . Years: 17.00  . Pack years: 17.00  . Types: Cigarettes  . Quit date: 06/23/1968  . Years since quitting: 52.4  Smokeless Tobacco Never Used   Social History   Substance and Sexual Activity  Alcohol Use No    Family History:  Family History  Problem Relation Age of Onset  . Heart disease Father        possibly  . Cancer Sister        breast  . AAA (abdominal aortic aneurysm) Brother   . Cancer Sister        lung    Past medical history, surgical history, medications, allergies, family history and social history reviewed with patient today and changes made to appropriate areas of the chart.   Review of Systems  Constitutional: Negative.   HENT: Negative.   Eyes: Positive for blurred vision. Negative for double vision, photophobia, pain, discharge and redness.  Respiratory: Negative.   Cardiovascular: Negative.   Gastrointestinal: Negative.   Genitourinary: Negative.   Musculoskeletal: Negative.   Skin: Negative.   Neurological: Negative.   Endo/Heme/Allergies: Positive for environmental allergies. Negative for polydipsia. Does not bruise/bleed easily.  Psychiatric/Behavioral: Negative.    All other ROS negative except what is listed above and in the HPI.      Objective:     BP (!) 169/77 (BP Location: Right Arm, Cuff Size: Normal)   Pulse (!) 57   Temp 98.1 F (36.7 C) (Oral)   Wt 173 lb (78.5 kg)   SpO2 99%   BMI 25.55 kg/m   Wt Readings from Last 3 Encounters:  11/05/20 173 lb (78.5 kg)  07/16/20 178 lb (80.7 kg)  05/08/20 173 lb 9.6 oz (78.7 kg)    Physical Exam Vitals and nursing note reviewed.  Constitutional:      General: He is not in acute distress.  Appearance: Normal appearance. He is obese. He is not ill-appearing, toxic-appearing or diaphoretic.  HENT:     Head: Normocephalic and atraumatic.     Right Ear: Tympanic membrane, ear canal and external ear normal. There is no impacted cerumen.     Left Ear: Tympanic membrane, ear canal and external ear normal. There is no impacted cerumen.     Nose: Nose normal. No congestion or rhinorrhea.     Mouth/Throat:     Mouth: Mucous membranes are moist.     Pharynx: Oropharynx is clear. No oropharyngeal exudate or posterior oropharyngeal erythema.  Eyes:     General: No scleral icterus.       Right eye: No discharge.        Left eye: No discharge.     Extraocular Movements: Extraocular movements intact.     Conjunctiva/sclera: Conjunctivae normal.     Pupils: Pupils are equal, round, and reactive to light.  Neck:     Vascular: No carotid bruit.  Cardiovascular:     Rate and Rhythm: Normal rate and regular rhythm.     Pulses: Normal pulses.     Heart sounds: No murmur heard. No friction rub. No gallop.   Pulmonary:     Effort: Pulmonary effort is normal. No respiratory distress.     Breath sounds: Normal breath sounds. No stridor. No wheezing, rhonchi or rales.  Chest:     Chest wall: No tenderness.  Abdominal:     General: Abdomen is flat. Bowel sounds are normal. There is no distension.     Palpations: Abdomen is soft. There is no mass.     Tenderness: There is no abdominal tenderness. There is no right CVA tenderness, left CVA tenderness, guarding or rebound.     Hernia: No  hernia is present.  Genitourinary:    Comments: Genital exam deferred with shared decision making Musculoskeletal:        General: No swelling, tenderness, deformity or signs of injury.     Cervical back: Normal range of motion and neck supple. No rigidity. No muscular tenderness.     Right lower leg: No edema.     Left lower leg: No edema.  Lymphadenopathy:     Cervical: No cervical adenopathy.  Skin:    General: Skin is warm and dry.     Capillary Refill: Capillary refill takes less than 2 seconds.     Coloration: Skin is not jaundiced or pale.     Findings: No bruising, erythema, lesion or rash.  Neurological:     General: No focal deficit present.     Mental Status: He is alert and oriented to person, place, and time.     Cranial Nerves: No cranial nerve deficit.     Sensory: No sensory deficit.     Motor: No weakness.     Coordination: Coordination normal.     Gait: Gait normal.     Deep Tendon Reflexes: Reflexes normal.  Psychiatric:        Mood and Affect: Mood normal.        Behavior: Behavior normal.        Thought Content: Thought content normal.        Judgment: Judgment normal.     Results for orders placed or performed in visit on 02/03/20  Comprehensive metabolic panel  Result Value Ref Range   Glucose 105 (H) 65 - 99 mg/dL   BUN 7 (L) 10 - 36 mg/dL   Creatinine, Ser 1.61 0.76 -  1.27 mg/dL   GFR calc non Af Amer 79 >59 mL/min/1.73   GFR calc Af Amer 91 >59 mL/min/1.73   BUN/Creatinine Ratio 9 (L) 10 - 24   Sodium 134 134 - 144 mmol/L   Potassium 4.3 3.5 - 5.2 mmol/L   Chloride 98 96 - 106 mmol/L   CO2 22 20 - 29 mmol/L   Calcium 9.1 8.6 - 10.2 mg/dL   Total Protein 6.4 6.0 - 8.5 g/dL   Albumin 4.4 3.5 - 4.6 g/dL   Globulin, Total 2.0 1.5 - 4.5 g/dL   Albumin/Globulin Ratio 2.2 1.2 - 2.2   Bilirubin Total 0.7 0.0 - 1.2 mg/dL   Alkaline Phosphatase 63 48 - 121 IU/L   AST 26 0 - 40 IU/L   ALT 18 0 - 44 IU/L  Lipid Panel w/o Chol/HDL Ratio  Result  Value Ref Range   Cholesterol, Total 105 100 - 199 mg/dL   Triglycerides 854 0 - 149 mg/dL   HDL 45 >62 mg/dL   VLDL Cholesterol Cal 21 5 - 40 mg/dL   LDL Chol Calc (NIH) 39 0 - 99 mg/dL      Assessment & Plan:   Problem List Items Addressed This Visit      Cardiovascular and Mediastinum   Hypertension    Runs good at home. Declines medication. Continue to monitor.       Relevant Medications   rosuvastatin (CRESTOR) 10 MG tablet   aspirin EC 81 MG tablet   Other Relevant Orders   Comprehensive metabolic panel   CBC with Differential/Platelet   Microalbumin, Urine Waived   TSH     Other   Hyperlipemia    Under good control on current regimen. Continue current regimen. Continue to monitor. Call with any concerns. Refills given. Labs drawn today.       Relevant Medications   rosuvastatin (CRESTOR) 10 MG tablet   aspirin EC 81 MG tablet   Other Relevant Orders   Comprehensive metabolic panel   Lipid Panel w/o Chol/HDL Ratio    Other Visit Diagnoses    Routine general medical examination at a health care facility    -  Primary   Vaccines declined. Screening labs checked today. Continue diet and exercise. Call with any concerns.        Discussed aspirin prophylaxis for myocardial infarction prevention and decision was made to continue ASA  LABORATORY TESTING:  Health maintenance labs ordered today as discussed above.   IMMUNIZATIONS:   - Tdap: Tetanus vaccination status reviewed: Declined. - Influenza: Postponed to flu season - Pneumovax: Refused - Prevnar: Refused - COVID: Up to date  PATIENT COUNSELING:    Sexuality: Discussed sexually transmitted diseases, partner selection, use of condoms, avoidance of unintended pregnancy  and contraceptive alternatives.   Advised to avoid cigarette smoking.  I discussed with the patient that most people either abstain from alcohol or drink within safe limits (<=14/week and <=4 drinks/occasion for males, <=7/weeks and <=  3 drinks/occasion for females) and that the risk for alcohol disorders and other health effects rises proportionally with the number of drinks per week and how often a drinker exceeds daily limits.  Discussed cessation/primary prevention of drug use and availability of treatment for abuse.   Diet: Encouraged to adjust caloric intake to maintain  or achieve ideal body weight, to reduce intake of dietary saturated fat and total fat, to limit sodium intake by avoiding high sodium foods and not adding table salt, and to maintain adequate dietary  potassium and calcium preferably from fresh fruits, vegetables, and low-fat dairy products.    stressed the importance of regular exercise  Injury prevention: Discussed safety belts, safety helmets, smoke detector, smoking near bedding or upholstery.   Dental health: Discussed importance of regular tooth brushing, flossing, and dental visits.   Follow up plan: NEXT PREVENTATIVE PHYSICAL DUE IN 1 YEAR. Return in about 6 months (around 05/08/2021).

## 2020-11-06 LAB — CBC WITH DIFFERENTIAL/PLATELET
Basophils Absolute: 0.1 10*3/uL (ref 0.0–0.2)
Basos: 1 %
EOS (ABSOLUTE): 0.1 10*3/uL (ref 0.0–0.4)
Eos: 2 %
Hematocrit: 40.4 % (ref 37.5–51.0)
Hemoglobin: 13.7 g/dL (ref 13.0–17.7)
Immature Grans (Abs): 0 10*3/uL (ref 0.0–0.1)
Immature Granulocytes: 0 %
Lymphocytes Absolute: 1.4 10*3/uL (ref 0.7–3.1)
Lymphs: 21 %
MCH: 32.2 pg (ref 26.6–33.0)
MCHC: 33.9 g/dL (ref 31.5–35.7)
MCV: 95 fL (ref 79–97)
Monocytes Absolute: 0.6 10*3/uL (ref 0.1–0.9)
Monocytes: 9 %
Neutrophils Absolute: 4.5 10*3/uL (ref 1.4–7.0)
Neutrophils: 67 %
Platelets: 187 10*3/uL (ref 150–450)
RBC: 4.26 x10E6/uL (ref 4.14–5.80)
RDW: 12.6 % (ref 11.6–15.4)
WBC: 6.7 10*3/uL (ref 3.4–10.8)

## 2020-11-06 LAB — COMPREHENSIVE METABOLIC PANEL
ALT: 17 IU/L (ref 0–44)
AST: 21 IU/L (ref 0–40)
Albumin/Globulin Ratio: 3 — ABNORMAL HIGH (ref 1.2–2.2)
Albumin: 4.2 g/dL (ref 3.5–4.6)
Alkaline Phosphatase: 55 IU/L (ref 44–121)
BUN/Creatinine Ratio: 8 — ABNORMAL LOW (ref 10–24)
BUN: 7 mg/dL — ABNORMAL LOW (ref 10–36)
Bilirubin Total: 0.7 mg/dL (ref 0.0–1.2)
CO2: 17 mmol/L — ABNORMAL LOW (ref 20–29)
Calcium: 8.6 mg/dL (ref 8.6–10.2)
Chloride: 100 mmol/L (ref 96–106)
Creatinine, Ser: 0.84 mg/dL (ref 0.76–1.27)
Globulin, Total: 1.4 g/dL — ABNORMAL LOW (ref 1.5–4.5)
Glucose: 94 mg/dL (ref 65–99)
Potassium: 4.1 mmol/L (ref 3.5–5.2)
Sodium: 136 mmol/L (ref 134–144)
Total Protein: 5.6 g/dL — ABNORMAL LOW (ref 6.0–8.5)
eGFR: 82 mL/min/{1.73_m2} (ref >59)

## 2020-11-06 LAB — LIPID PANEL W/O CHOL/HDL RATIO
Cholesterol, Total: 107 mg/dL (ref 100–199)
HDL: 44 mg/dL (ref >39)
LDL Chol Calc (NIH): 42 mg/dL (ref 0–99)
Triglycerides: 119 mg/dL (ref 0–149)
VLDL Cholesterol Cal: 21 mg/dL (ref 5–40)

## 2020-11-06 LAB — TSH: TSH: 2.14 u[IU]/mL (ref 0.450–4.500)

## 2020-11-09 ENCOUNTER — Encounter: Payer: Self-pay | Admitting: Family Medicine

## 2021-01-01 ENCOUNTER — Telehealth: Payer: Self-pay | Admitting: General Practice

## 2021-01-01 ENCOUNTER — Ambulatory Visit (INDEPENDENT_AMBULATORY_CARE_PROVIDER_SITE_OTHER): Payer: Medicare Other | Admitting: General Practice

## 2021-01-01 DIAGNOSIS — I10 Essential (primary) hypertension: Secondary | ICD-10-CM | POA: Diagnosis not present

## 2021-01-01 DIAGNOSIS — E782 Mixed hyperlipidemia: Secondary | ICD-10-CM | POA: Diagnosis not present

## 2021-01-01 DIAGNOSIS — I2510 Atherosclerotic heart disease of native coronary artery without angina pectoris: Secondary | ICD-10-CM

## 2021-01-01 NOTE — Chronic Care Management (AMB) (Signed)
Chronic Care Management   CCM RN Visit Note  01/01/2021 Name: Nicholas Gross MRN: [id number] DOB: April 26, 1928  Subjective: Nicholas Gross is a 87 y.o. year old male who is a primary care patient of [name], DO. The care management team was consulted for assistance with disease management and care coordination needs.    Engaged with patient by telephone for follow up visit in response to provider referral for case management and/or care coordination services.   Consent to Services:  The patient was given information about Chronic Care Management services, agreed to services, and gave verbal consent prior to initiation of services.  Please see initial visit note for detailed documentation.   Patient agreed to services and verbal consent obtained.   Assessment: Review of patient past medical history, allergies, medications, health status, including review of consultants reports, laboratory and other test data, was performed as part of comprehensive evaluation and provision of chronic care management services.   SDOH (Social Determinants of Health) assessments and interventions performed:    CCM Care Plan  Allergies  Allergen Reactions   Other     Patient states that he is allergic to something that they place in the IV before they work on you   Simvastatin Other (See Comments)    Myalgia     Outpatient Encounter Medications as of 01/01/2021  Medication Sig   aspirin EC 81 MG tablet Take 1 tablet (81 mg total) by mouth daily.   Multiple Vitamins-Minerals (PRESERVISION AREDS PO) Take by mouth 2 (two) times daily.   rosuvastatin (CRESTOR) 10 MG tablet Take 1 tablet (10 mg total) by mouth daily.   No facility-administered encounter medications on file as of 01/01/2021.    Patient Active Problem List   Diagnosis Date Noted   PAD (peripheral artery disease) ([place]) 07/15/2020   Lymphedema 05/06/2019   Hyperlipemia 02/13/2016   Macular degeneration 08/15/2015   Carotid  atherosclerosis 08/15/2015   Allergic rhinitis due to pollen 02/12/2015   Hypertension    CAD (coronary artery disease)    H/O cardiac catheterization 06/12/2014   History of left-sided carotid endarterectomy 06/12/2014    Conditions to be addressed/monitored:CAD, HTN, and HLD  Care Plan : RNCM: Hypertension (Adult)  Updates made by Vanita Ingles since 01/01/2021 12:00 AM     Problem: RNCM; Hypertension (Hypertension)   Priority: Medium     Long-Range Goal: RNCM: Hypertension Monitored   Start Date: 10/30/2020  Expected End Date: 10/28/2021  This Visit's Progress: On track  Priority: High  Note:   Objective:  Last practice recorded BP readings:  BP Readings from Last 3 Encounters:  11/05/20 (!) 169/77  07/16/20 140/88  05/08/20 (!) 170/76    Most recent eGFR/CrCl: No results found for: EGFR  No components found for: CRCL Current Barriers:  Knowledge Deficits related to basic understanding of hypertension pathophysiology and self care management Knowledge Deficits related to understanding of medications prescribed for management of hypertension Limited Social Support Lacks social connections Does not contact provider office for questions/concerns Case Manager Clinical Goal(s):  patient will verbalize understanding of plan for hypertension management patient will attend all scheduled medical appointments: 05-08-2021 patient will demonstrate improved adherence to prescribed treatment plan for hypertension as evidenced by taking all medications as prescribed, monitoring and recording blood pressure as directed, adhering to low sodium/DASH diet patient will demonstrate improved health management independence as evidenced by checking blood pressure as directed and notifying PCP if SBP>160 or DBP > 90, taking all medications  as prescribe, and adhering to a low sodium diet as discussed. patient will verbalize basic understanding of hypertension disease process and self health  management plan as evidenced by compliance with medications, compliance with heart healthy diet, and working with the CCM team to manage  health and well being.  Interventions:  Collaboration with [name], DO regarding development and update of comprehensive plan of care as evidenced by provider attestation and co-signature Inter-disciplinary care team collaboration (see longitudinal plan of care) Evaluation of current treatment plan related to hypertension self management and patient's adherence to plan as established by provider.01-01-2021: The patients pressure was elevated in the office but the patient states that his pressures are always elevated in the office. He takes it at home and it is normal range. He denies any issues with his blood pressure and does not take medications for it. Does exercise regularly. Walks several miles a day and does 40 push ups daily. Had already completed his push ups at the time of the call.  Provided education to patient re: stroke prevention, s/s of heart attack and stroke, DASH diet, complications of uncontrolled blood pressure Reviewed medications with patient and discussed importance of compliance. 01-01-2021: Takes no medications for blood pressure but is compliant with medications Discussed plans with patient for ongoing care management follow up and provided patient with direct contact information for care management team Advised patient, providing education and rationale, to monitor blood pressure daily and record, calling PCP for findings outside established parameters.  Reviewed scheduled/upcoming provider appointments including: 05-08-2021 Self-Care Activities: - Self administers medications as prescribed Attends all scheduled provider appointments Calls provider office for new concerns, questions, or BP outside discussed parameters Checks BP and records as discussed Follows a low sodium diet/DASH diet Patient Goals: - check blood pressure  weekly - choose a place to take my blood pressure (home, clinic or office, retail store) - write blood pressure results in a log or diary - agree on reward when goals are met - agree to work together to make changes - ask questions to understand - have a family meeting to talk about healthy habits - learn about high blood pressure - blood pressure trends reviewed - depression screen reviewed - home or ambulatory blood pressure monitoring encouraged Follow Up Plan: Telephone follow up appointment with care management team member scheduled for: 03-12-2021 at 0945 am    Task: [facility]; Identify and Monitor Blood Pressure Elevation Completed 01/01/2021  Outcome: Positive  Note:   Care Management Activities:    - blood pressure trends reviewed - depression screen reviewed - home or ambulatory blood pressure monitoring encouraged        Care Plan : RNCM; Coronary Artery Disease (Adult) and HLD  Updates made by Vanita Ingles since 01/01/2021 12:00 AM     Problem: RNCM: Disease Progression (Coronary Artery Disease) and HLD   Priority: Medium     Long-Range Goal: RNCM: Disease Progression Prevented or Minimized and HLD   Start Date: 10/30/2020  Expected End Date: 10/28/2021  This Visit's Progress: On track  Priority: Medium  Note:   Current Barriers:  Poorly controlled hyperlipidemia, complicated by HTN Current antihyperlipidemic regimen: crestor 10 mg QD Most recent lipid panel:     Component Value Date/Time   CHOL 105 02/03/2020 1341   CHOL 123 02/13/2016 0834   TRIG 118 02/03/2020 1341   TRIG 114 02/13/2016 0834   HDL 45 02/03/2020 1341   VLDL 23 02/13/2016 0834   LDLCALC 39 02/03/2020  1341   ASCVD risk enhancing conditions: age >60, HTN,  former smoker Lacks Microbiologist Clinical Goal(s):  patient will work with Consulting civil engineer, providers, and care team towards execution of optimized self-health management plan patient will verbalize understanding of  plan for effective management of HLD and CAD patient will work with [facility] and pcp to address needs related to management of HLD and CAD patient will attend all scheduled medical appointments: 05-08-2021 Interventions: Collaboration with [name], DO regarding development and update of comprehensive plan of care as evidenced by provider attestation and co-signature Inter-disciplinary care team collaboration (see longitudinal plan of care) Medication review performed; medication list updated in electronic medical record.  Inter-disciplinary care team collaboration (see longitudinal plan of care) Referred to pharmacy team for assistance with HLD medication management Evaluation of current treatment plan related to HLD and CAD and patient's adherence to plan as established by provider. 01-01-2021: The patient is adherent to a heart healthy diet, is mindful of sodium in foods and gets the canned vegetables with no salt added. States he feels great. Says he is working on improving his memory. He can remember things about his childhood but he cannot remember things current.  He states he is not forgetting to take his medications but he feels his memory should be better. Reflective listening and support given. Only sleeps about 4 hours a night but naps during the day. Denies any new concerns at this time.  Advised patient to call the office for changes or questions, also reminded the patient of his appointment on 05-08-2021 with Dr. Wynetta Emery.  Provided education to patient re: to continue with heart healthy diet, current exercise regimen, and healthy habits.  Reviewed scheduled/upcoming provider appointments including: 05-08-2021 Discussed plans with patient for ongoing care management follow up and provided patient with direct contact information for care management team Patient Goals/Self-Care Activities: - call for medicine refill 2 or 3 days before it runs out - call if I am sick and can't take my  medicine - keep a list of all the medicines I take; vitamins and herbals too - learn to read medicine labels - use a pillbox to sort medicine - use an alarm clock or phone to remind me to take my medicine - change to whole grain breads, cereal, pasta - drink 6 to 8 glasses of water each day - eat 3 to 5 servings of fruits and vegetables each day - eat 5 or 6 small meals each day - eat fish at least once per week - fill half the plate with nonstarchy vegetables - limit fast food meals to no more than 1 per week - manage portion size - prepare main meal at home 3 to 5 days each week - read food labels for fat, fiber, carbohydrates and portion size - be open to making changes - I can manage, know and watch for signs of a heart attack - if I have chest pain, call for help - learn about small changes that will make a big difference - learn my personal risk factors  - barriers to treatment adherence reviewed and addressed - functional limitation screening reviewed - healthy lifestyle promoted - medication-adherence assessment completed - rescue (action) plan developed - response to pharmacologic therapy monitored - self-awareness of signs/symptoms of worsening disease encouraged Follow Up Plan:  Telephone follow up appointment with care management team member scheduled for: 03-12-2021 at 0945 am       Task: RNCM: Alleviate Barriers  to Coronary Artery Disease Therapy and HLD Completed 01/01/2021  Outcome: Positive  Note:   Care Management Activities:    - barriers to treatment adherence reviewed and addressed - functional limitation screening reviewed - healthy lifestyle promoted - medication-adherence assessment completed - rescue (action) plan developed - response to pharmacologic therapy monitored - self-awareness of signs/symptoms of worsening disease encouraged          Plan:Telephone follow up appointment with care management team member scheduled for:  03-12-2021 at [place]  am  [place], MSN, [facility] Mobile: [phone]

## 2021-01-01 NOTE — Patient Instructions (Signed)
Visit Information  PATIENT GOALS:  Goals Addressed             This Visit's Progress    RNCM: Improve My Heart Health-Coronary Artery Disease       Timeframe:  Long-Range Goal Priority:  Medium Start Date:    01-01-2021                         Expected End Date:    01-01-2022                   Follow Up Date 03-12-2021   - be open to making changes - I can manage, know and watch for signs of a heart attack - if I have chest pain, call for help - learn about small changes that will make a big difference - learn my personal risk factors    Why is this important?   Lifestyle changes are key to improving the blood flow to your heart. Think about the things you can change and set a goal to live healthy.  Remember, when the blood vessels to your heart start to get clogged you may not have any symptoms.  Over time, they can get worse.  Don't ignore the signs, like chest pain, and get help right away.     Notes: Monitor fats and sodium in diet, follow a heart healthy diet, does well with reading labels. The patient walks daily and does push ups. Had done 40 push ups at the time of the call this am.       RNCM: Lifestyle Change-Hypertension       Timeframe:  Long-Range Goal Priority:  Medium Start Date:   01-01-2021                          Expected End Date:   01-01-2022                    Follow Up Date 9-/20/2022    - agree on reward when goals are met - agree to work together to make changes - ask questions to understand - have a family meeting to talk about healthy habits - learn about high blood pressure    Why is this important?   The changes that you are asked to make may be hard to do.  This is especially true when the changes are life-long.  Knowing why it is important to you is the first step.  Working on the change with your family or support person helps you not feel alone.  Reward yourself and family or support person when goals are met. This can be an activity you  choose like bowling, hiking, biking, swimming or shooting hoops.     Notes: takes blood pressures at home, states he always has high blood pressures in the office, talked about white coat syndrome. The patient takes his blood pressures at home and states they are WNL.          The patient verbalized understanding of instructions, educational materials, and care plan provided today and declined offer to receive copy of patient instructions, educational materials, and care plan.   Telephone follow up appointment with care management team member scheduled for: 03-12-2021  Noreene Larsson RN, MSN, [facility] Mobile: [phone]

## 2021-01-01 NOTE — Telephone Encounter (Signed)
  Chronic Care Management   Note  01/01/2021 Name: Nicholas Gross MRN: [id number] DOB: 01-17-1928  Called the patient back and completed new encounter. See new encounter for details.   Follow up plan: Telephone follow up appointment with care management team member scheduled for: 03-12-2021 at 0945 am  [name] RN, MSN, CCM Community Care Coordinator [place]  [facility] Mobile: [phone]

## 2021-01-03 ENCOUNTER — Other Ambulatory Visit: Payer: Self-pay | Admitting: Family Medicine

## 2021-03-12 ENCOUNTER — Ambulatory Visit (INDEPENDENT_AMBULATORY_CARE_PROVIDER_SITE_OTHER): Payer: Medicare Other

## 2021-03-12 ENCOUNTER — Telehealth: Payer: Medicare Other | Admitting: General Practice

## 2021-03-12 DIAGNOSIS — I10 Essential (primary) hypertension: Secondary | ICD-10-CM

## 2021-03-12 DIAGNOSIS — R413 Other amnesia: Secondary | ICD-10-CM

## 2021-03-12 DIAGNOSIS — E782 Mixed hyperlipidemia: Secondary | ICD-10-CM

## 2021-03-12 NOTE — Chronic Care Management (AMB) (Signed)
Chronic Care Management   CCM RN Visit Note  03/12/2021 Name: Nicholas Gross MRN: [id number] DOB: 1928-06-01  Subjective: MAANAV KASSABIAN is a 87 y.o. year old male who is a primary care patient of [name], DO. The care management team was consulted for assistance with disease management and care coordination needs.    Engaged with patient by telephone for follow up visit in response to provider referral for case management and/or care coordination services.   Consent to Services:  The patient was given information about Chronic Care Management services, agreed to services, and gave verbal consent prior to initiation of services.  Please see initial visit note for detailed documentation.   Patient agreed to services and verbal consent obtained.   Assessment: Review of patient past medical history, allergies, medications, health status, including review of consultants reports, laboratory and other test data, was performed as part of comprehensive evaluation and provision of chronic care management services.   SDOH (Social Determinants of Health) assessments and interventions performed:    CCM Care Plan  Allergies  Allergen Reactions   Other     Patient states that he is allergic to something that they place in the IV before they work on you   Simvastatin Other (See Comments)    Myalgia     Outpatient Encounter Medications as of 03/12/2021  Medication Sig   aspirin EC 81 MG tablet Take 1 tablet (81 mg total) by mouth daily.   Multiple Vitamins-Minerals (PRESERVISION AREDS PO) Take by mouth 2 (two) times daily.   rosuvastatin (CRESTOR) 10 MG tablet Take 1 tablet (10 mg total) by mouth daily.   No facility-administered encounter medications on file as of 03/12/2021.    Patient Active Problem List   Diagnosis Date Noted   PAD (peripheral artery disease) ([place]) 07/15/2020   Lymphedema 05/06/2019   Hyperlipemia 02/13/2016   Macular degeneration 08/15/2015   Carotid  atherosclerosis 08/15/2015   Allergic rhinitis due to pollen 02/12/2015   Hypertension    CAD (coronary artery disease)    H/O cardiac catheterization 06/12/2014   History of left-sided carotid endarterectomy 06/12/2014    Conditions to be addressed/monitored:HTN, HLD, and Memory changes   Care Plan : RNCM: Hypertension (Adult)  Updates made by Vanita Ingles, RN since 03/12/2021 12:00 AM     Problem: RNCM; Hypertension (Hypertension)   Priority: Medium     Long-Range Goal: RNCM: Hypertension Monitored   Start Date: 10/30/2020  Expected End Date: 10/28/2021  This Visit's Progress: On track  Recent Progress: On track  Priority: High  Note:   Objective:  Last practice recorded BP readings:  BP Readings from Last 3 Encounters:  11/05/20 (!) 169/77  07/16/20 140/88  05/08/20 (!) 170/76    Most recent eGFR/CrCl: No results found for: EGFR  No components found for: CRCL Current Barriers:  Knowledge Deficits related to basic understanding of hypertension pathophysiology and self care management Knowledge Deficits related to understanding of medications prescribed for management of hypertension Limited Social Support Lacks social connections Does not contact provider office for questions/concerns Case Manager Clinical Goal(s):  patient will verbalize understanding of plan for hypertension management patient will attend all scheduled medical appointments: 05-08-2021 at 0920 am patient will demonstrate improved adherence to prescribed treatment plan for hypertension as evidenced by taking all medications as prescribed, monitoring and recording blood pressure as directed, adhering to low sodium/DASH diet patient will demonstrate improved health management independence as evidenced by checking blood pressure as directed and  notifying PCP if SBP>160 or DBP > 90, taking all medications as prescribe, and adhering to a low sodium diet as discussed. patient will verbalize basic understanding  of hypertension disease process and self health management plan as evidenced by compliance with medications, compliance with heart healthy diet, and working with the CCM team to manage  health and well being.  Interventions:  Collaboration with [name], DO regarding development and update of comprehensive plan of care as evidenced by provider attestation and co-signature Inter-disciplinary care team collaboration (see longitudinal plan of care) Evaluation of current treatment plan related to hypertension self management and patient's adherence to plan as established by provider.01-01-2021: The patients pressure was elevated in the office but the patient states that his pressures are always elevated in the office. He takes it at home and it is normal range. He denies any issues with his blood pressure and does not take medications for it. Does exercise regularly. Walks several miles a day and does 40 push ups daily. Had already completed his push ups at the time of the call. 03-12-2021: The patient continues to do well. Has walked a mile today and done 45 push ups today. States he will walk more today and feels great, he is having a good day. Denies any new concerns with his cardiac health. States that he is taking a "memory pill" but does not see where that is helping with his memory. The patient states he can remember a lot from when he was younger but not so much now. He could recall dates of service in the [employer] and other things like that. He denies any acute distress.  Provided education to patient re: stroke prevention, s/s of heart attack and stroke, DASH diet, complications of uncontrolled blood pressure Reviewed medications with patient and discussed importance of compliance. 03-12-2021: Takes no medications for blood pressure but is compliant with medications Discussed plans with patient for ongoing care management follow up and provided patient with direct contact information for care  management team Advised patient, providing education and rationale, to monitor blood pressure daily and record, calling PCP for findings outside established parameters.  Reviewed scheduled/upcoming provider appointments including: 05-08-2021 0920 am  Self-Care Activities: - Self administers medications as prescribed Attends all scheduled provider appointments Calls provider office for new concerns, questions, or BP outside discussed parameters Checks BP and records as discussed Follows a low sodium diet/DASH diet Patient Goals: - check blood pressure weekly - choose a place to take my blood pressure (home, clinic or office, retail store) - write blood pressure results in a log or diary - agree on reward when goals are met - agree to work together to make changes - ask questions to understand - have a family meeting to talk about healthy habits - learn about high blood pressure - blood pressure trends reviewed - depression screen reviewed - home or ambulatory blood pressure monitoring encouraged Follow Up Plan: Telephone follow up appointment with care management team member scheduled for: 05-14-2021 at 1030 am    Care Plan : [facility]; Coronary Artery Disease (Adult) and HLD  Updates made by Vanita Ingles, RN since 03/12/2021 12:00 AM     Problem: RNCM: Disease Progression (Coronary Artery Disease) and HLD   Priority: Medium     Long-Range Goal: RNCM: Disease Progression Prevented or Minimized and HLD   Start Date: 10/30/2020  Expected End Date: 10/28/2021  This Visit's Progress: On track  Recent Progress: On track  Priority: Medium  Note:  Current Barriers:  Poorly controlled hyperlipidemia, complicated by HTN Current antihyperlipidemic regimen: crestor 10 mg QD Most recent lipid panel:     Component Value Date/Time   CHOL 107 11/05/2020 1010   CHOL 123 02/13/2016 0834   TRIG 119 11/05/2020 1010   TRIG 114 02/13/2016 0834   HDL 44 11/05/2020 1010   VLDL 23 02/13/2016 0834    LDLCALC 42 11/05/2020 1010   ASCVD risk enhancing conditions: age >73, HTN,  former smoker Lacks Microbiologist Clinical Goal(s):  patient will work with Consulting civil engineer, providers, and care team towards execution of optimized self-health management plan patient will verbalize understanding of plan for effective management of HLD and CAD patient will work with [facility] and pcp to address needs related to management of HLD and CAD patient will attend all scheduled medical appointments: 05-08-2021 at 0920 am Interventions: Collaboration with [name], DO regarding development and update of comprehensive plan of care as evidenced by provider attestation and co-signature Inter-disciplinary care team collaboration (see longitudinal plan of care) Medication review performed; medication list updated in electronic medical record.  Inter-disciplinary care team collaboration (see longitudinal plan of care) Referred to pharmacy team for assistance with HLD medication management Evaluation of current treatment plan related to HLD and CAD and patient's adherence to plan as established by provider. 03-12-2021: The patient is adherent to a heart healthy diet, is mindful of sodium in foods and gets the canned vegetables with no salt added. States he feels great. Says he is working on improving his memory. He can remember things about his childhood but he cannot remember things current.  He states he is not forgetting to take his medications but he feels his memory should be better. Reflective listening and support given. Only sleeps about 4 hours a night but naps during the day. Denies any new concerns at this time. Usual bedtime is 0930 and gets up at 330 am each morning. Breakfast is usually about 4 am, lunch at 11 am, and supper about 4 pm. States he is doing well and denies any new concerns. Will continue to monitor.  Advised patient to call the office for changes or questions, also  reminded the patient of his appointment on 05-08-2021 with Dr. Wynetta Emery.  Provided education to patient re: to continue with heart healthy diet, current exercise regimen, and healthy habits.  Reviewed scheduled/upcoming provider appointments including: 05-08-2021 at 0920 am Discussed plans with patient for ongoing care management follow up and provided patient with direct contact information for care management team Patient Goals/Self-Care Activities: - call for medicine refill 2 or 3 days before it runs out - call if I am sick and can't take my medicine - keep a list of all the medicines I take; vitamins and herbals too - learn to read medicine labels - use a pillbox to sort medicine - use an alarm clock or phone to remind me to take my medicine - change to whole grain breads, cereal, pasta - drink 6 to 8 glasses of water each day - eat 3 to 5 servings of fruits and vegetables each day - eat 5 or 6 small meals each day - eat fish at least once per week - fill half the plate with nonstarchy vegetables - limit fast food meals to no more than 1 per week - manage portion size - prepare main meal at home 3 to 5 days each week - read food labels for fat, fiber, carbohydrates and portion size -  be open to making changes - I can manage, know and watch for signs of a heart attack - if I have chest pain, call for help - learn about small changes that will make a big difference - learn my personal risk factors  - barriers to treatment adherence reviewed and addressed - functional limitation screening reviewed - healthy lifestyle promoted - medication-adherence assessment completed - rescue (action) plan developed - response to pharmacologic therapy monitored - self-awareness of signs/symptoms of worsening disease encouraged Follow Up Plan:  Telephone follow up appointment with care management team member scheduled for: 05-14-2021 at 1030 am        Plan:Telephone follow up appointment with  care management team member scheduled for:  05-14-2021 at 29 am  [place], MSN, [facility] Mobile: [phone]

## 2021-03-12 NOTE — Patient Instructions (Signed)
Visit Information  PATIENT GOALS:  Goals Addressed             This Visit's Progress    RNCM: Improve My Heart Health-Coronary Artery Disease       Timeframe:  Long-Range Goal Priority:  Medium Start Date:    01-01-2021                         Expected End Date:    01-01-2022                   Follow Up Date 05-14-2021   - be open to making changes - I can manage, know and watch for signs of a heart attack - if I have chest pain, call for help - learn about small changes that will make a big difference - learn my personal risk factors    Why is this important?   Lifestyle changes are key to improving the blood flow to your heart. Think about the things you can change and set a goal to live healthy.  Remember, when the blood vessels to your heart start to get clogged you may not have any symptoms.  Over time, they can get worse.  Don't ignore the signs, like chest pain, and get help right away.     Notes: Monitor fats and sodium in diet, follow a heart healthy diet, does well with reading labels. The patient walks daily and does push ups. Had done 40 push ups at the time of the call this am. 03-12-2021: The patient is doing well. States that has done his push ups and already got in a mile. The patient states that he is feeling good and doing well.      RNCM: Lifestyle Change-Hypertension       Timeframe:  Long-Range Goal Priority:  Medium Start Date:   01-01-2021                          Expected End Date:   01-01-2022                    Follow Up Date 05-14-2021    - agree on reward when goals are met - agree to work together to make changes - ask questions to understand - have a family meeting to talk about healthy habits - learn about high blood pressure    Why is this important?   The changes that you are asked to make may be hard to do.  This is especially true when the changes are life-long.  Knowing why it is important to you is the first step.  Working on the  change with your family or support person helps you not feel alone.  Reward yourself and family or support person when goals are met. This can be an activity you choose like bowling, hiking, biking, swimming or shooting hoops.     Notes: takes blood pressures at home, states he always has high blood pressures in the office, talked about white coat syndrome. The patient takes his blood pressures at home and states they are WNL. 03-12-2021: The patient is doing well. States his blood pressures are good at home. Denies any acute distress. Will continue to monitor.         Patient verbalizes understanding of instructions provided today and agrees to view in [place].   Telephone follow up appointment with care management team member scheduled for:  05-14-2021 at 1030 am  [place], MSN, [facility] Mobile: [phone]

## 2021-03-22 DIAGNOSIS — I10 Essential (primary) hypertension: Secondary | ICD-10-CM

## 2021-03-22 DIAGNOSIS — E782 Mixed hyperlipidemia: Secondary | ICD-10-CM

## 2021-05-08 ENCOUNTER — Ambulatory Visit: Payer: Medicare Other | Admitting: Family Medicine

## 2021-05-10 ENCOUNTER — Ambulatory Visit (INDEPENDENT_AMBULATORY_CARE_PROVIDER_SITE_OTHER): Payer: Medicare Other | Admitting: Family Medicine

## 2021-05-10 ENCOUNTER — Encounter: Payer: Self-pay | Admitting: Family Medicine

## 2021-05-10 ENCOUNTER — Other Ambulatory Visit: Payer: Self-pay

## 2021-05-10 VITALS — BP 168/72 | HR 58 | Ht 69.0 in | Wt 166.0 lb

## 2021-05-10 DIAGNOSIS — I10 Essential (primary) hypertension: Secondary | ICD-10-CM

## 2021-05-10 DIAGNOSIS — E782 Mixed hyperlipidemia: Secondary | ICD-10-CM | POA: Diagnosis not present

## 2021-05-10 MED ORDER — LISINOPRIL 5 MG PO TABS: 5.0000 mg | ORAL_TABLET | Freq: Every day | ORAL | 1 refills | Status: DC

## 2021-05-10 MED ORDER — ROSUVASTATIN CALCIUM 10 MG PO TABS: 10.0000 mg | ORAL_TABLET | Freq: Every day | ORAL | 1 refills | Status: DC

## 2021-05-10 NOTE — Assessment & Plan Note (Signed)
Known white coat htn. Tolerating 5mg  lisinopril at home. Continue current regimen. Continue to monitor. Call with any concerns.

## 2021-05-10 NOTE — Assessment & Plan Note (Signed)
Under good control on current regimen. Continue current regimen. Continue to monitor. Call with any concerns. Refills given. Labs drawn today.   

## 2021-05-10 NOTE — Progress Notes (Signed)
BP (!) 168/72   Pulse (!) 58   Ht _0  (1.753 m)   Wt 166 lb (75.3 kg)   SpO2 100%   BMI 24.51 kg/m    Subjective:    Patient ID: Nicholas Gross, male    DOB: [birth year], 87 y.o.   MRN: [id number]  HPI: Nicholas Gross is a 87 y.o. male  Chief Complaint  Patient presents with   Hypertension   Hyperlipidemia   HYPERTENSION / [place] Satisfied with current treatment? yes Duration of hypertension: chronic BP monitoring frequency: a few times a month BP range: 120s/70s at home BP medication side effects: no Past BP meds: lisinopril Duration of hyperlipidemia: chronic Cholesterol medication side effects: no Cholesterol supplements: none Past cholesterol medications: crestor Medication compliance: excellent compliance Aspirin: no Recent stressors: no Recurrent headaches: no Visual changes: no Palpitations: no Dyspnea: no Chest pain: no Lower extremity edema: no Dizzy/lightheaded: yes  Relevant past medical, surgical, family and social history reviewed and updated as indicated. Interim medical history since our last visit reviewed. Allergies and medications reviewed and updated.  Review of Systems  Constitutional: Negative.   Respiratory: Negative.    Cardiovascular: Negative.   Gastrointestinal: Negative.   Musculoskeletal: Negative.   Psychiatric/Behavioral: Negative.     Per HPI unless specifically indicated above     Objective:    BP (!) 168/72   Pulse (!) 58   Ht _1  (1.753 m)   Wt 166 lb (75.3 kg)   SpO2 100%   BMI 24.51 kg/m   Wt Readings from Last 3 Encounters:  05/10/21 166 lb (75.3 kg)  11/05/20 173 lb (78.5 kg)  07/16/20 178 lb (80.7 kg)    Physical Exam Vitals and nursing note reviewed.  Constitutional:      General: He is not in acute distress.    Appearance: Normal appearance. He is not ill-appearing, toxic-appearing or diaphoretic.  HENT:     Head: Normocephalic and atraumatic.     Right Ear: External ear normal.      Left Ear: External ear normal.     Nose: Nose normal.     Mouth/Throat:     Mouth: Mucous membranes are moist.     Pharynx: Oropharynx is clear.  Eyes:     General: No scleral icterus.       Right eye: No discharge.        Left eye: No discharge.     Extraocular Movements: Extraocular movements intact.     Conjunctiva/sclera: Conjunctivae normal.     Pupils: Pupils are equal, round, and reactive to light.  Cardiovascular:     Rate and Rhythm: Normal rate and regular rhythm.     Pulses: Normal pulses.     Heart sounds: Normal heart sounds. No murmur heard.   No friction rub. No gallop.  Pulmonary:     Effort: Pulmonary effort is normal. No respiratory distress.     Breath sounds: Normal breath sounds. No stridor. No wheezing, rhonchi or rales.  Chest:     Chest wall: No tenderness.  Musculoskeletal:        General: Normal range of motion.     Cervical back: Normal range of motion and neck supple.  Skin:    General: Skin is warm and dry.     Capillary Refill: Capillary refill takes less than 2 seconds.     Coloration: Skin is not jaundiced or pale.     Findings: No bruising, erythema, lesion or rash.  Neurological:  General: No focal deficit present.     Mental Status: He is alert and oriented to person, place, and time. Mental status is at baseline.  Psychiatric:        Mood and Affect: Mood normal.        Behavior: Behavior normal.        Thought Content: Thought content normal.        Judgment: Judgment normal.    Results for orders placed or performed in visit on 11/05/20  Comprehensive metabolic panel  Result Value Ref Range   Glucose 94 65 - 99 mg/dL   BUN 7 (L) 10 - 36 mg/dL   Creatinine, Ser 0.84 0.76 - 1.27 mg/dL   eGFR 82 >59 mL/min/1.73   BUN/Creatinine Ratio 8 (L) 10 - 24   Sodium 136 134 - 144 mmol/L   Potassium 4.1 3.5 - 5.2 mmol/L   Chloride 100 96 - 106 mmol/L   CO2 17 (L) 20 - 29 mmol/L   Calcium 8.6 8.6 - 10.2 mg/dL   Total Protein 5.6 (L) 6.0  - 8.5 g/dL   Albumin 4.2 3.5 - 4.6 g/dL   Globulin, Total 1.4 (L) 1.5 - 4.5 g/dL   Albumin/Globulin Ratio 3.0 (H) 1.2 - 2.2   Bilirubin Total 0.7 0.0 - 1.2 mg/dL   Alkaline Phosphatase 55 44 - 121 IU/L   AST 21 0 - 40 IU/L   ALT 17 0 - 44 IU/L  CBC with Differential/Platelet  Result Value Ref Range   WBC 6.7 3.4 - 10.8 x10E3/uL   RBC 4.26 4.14 - 5.80 x10E6/uL   Hemoglobin 13.7 13.0 - 17.7 g/dL   Hematocrit 40.4 37.5 - 51.0 %   MCV 95 79 - 97 fL   MCH 32.2 26.6 - 33.0 pg   MCHC 33.9 31.5 - 35.7 g/dL   RDW 12.6 11.6 - 15.4 %   Platelets 187 150 - 450 x10E3/uL   Neutrophils 67 Not Estab. %   Lymphs 21 Not Estab. %   Monocytes 9 Not Estab. %   Eos 2 Not Estab. %   Basos 1 Not Estab. %   Neutrophils Absolute 4.5 1.4 - 7.0 x10E3/uL   Lymphocytes Absolute 1.4 0.7 - 3.1 x10E3/uL   Monocytes Absolute 0.6 0.1 - 0.9 x10E3/uL   EOS (ABSOLUTE) 0.1 0.0 - 0.4 x10E3/uL   Basophils Absolute 0.1 0.0 - 0.2 x10E3/uL   Immature Granulocytes 0 Not Estab. %   Immature Grans (Abs) 0.0 0.0 - 0.1 x10E3/uL  Lipid Panel w/o Chol/HDL Ratio  Result Value Ref Range   Cholesterol, Total 107 100 - 199 mg/dL   Triglycerides 119 0 - 149 mg/dL   HDL 44 >39 mg/dL   VLDL Cholesterol Cal 21 5 - 40 mg/dL   LDL Chol Calc (NIH) 42 0 - 99 mg/dL  Microalbumin, Urine Waived  Result Value Ref Range   Microalb, Ur Waived 10 0 - 19 mg/L   Creatinine, Urine Waived 50 10 - 300 mg/dL   Microalb/Creat Ratio <30 <30 mg/g  TSH  Result Value Ref Range   TSH 2.140 0.450 - 4.500 uIU/mL      Assessment & Plan:   Problem List Items Addressed This Visit       Cardiovascular and Mediastinum   Hypertension    Known white coat htn. Tolerating 86m lisinopril at home. Continue current regimen. Continue to monitor. Call with any concerns.       Relevant Medications   lisinopril (ZESTRIL) 5 MG tablet  rosuvastatin (CRESTOR) 10 MG tablet   Other Relevant Orders   Comprehensive metabolic panel     Other   Hyperlipemia  - Primary    Under good control on current regimen. Continue current regimen. Continue to monitor. Call with any concerns. Refills given. Labs drawn today.       Relevant Medications   lisinopril (ZESTRIL) 5 MG tablet   rosuvastatin (CRESTOR) 10 MG tablet   Other Relevant Orders   Comprehensive metabolic panel   Lipid Panel w/o Chol/HDL Ratio     Follow up plan: Return in about 6 months (around 11/07/2021), or physical.

## 2021-05-11 ENCOUNTER — Encounter: Payer: Self-pay | Admitting: Family Medicine

## 2021-05-11 LAB — COMPREHENSIVE METABOLIC PANEL WITH GFR
ALT: 21 IU/L (ref 0–44)
AST: 31 IU/L (ref 0–40)
Albumin/Globulin Ratio: 2.5 — ABNORMAL HIGH (ref 1.2–2.2)
Albumin: 4.7 g/dL — ABNORMAL HIGH (ref 3.5–4.6)
Alkaline Phosphatase: 64 IU/L (ref 44–121)
BUN/Creatinine Ratio: 12 (ref 10–24)
BUN: 9 mg/dL — ABNORMAL LOW (ref 10–36)
Bilirubin Total: 0.7 mg/dL (ref 0.0–1.2)
CO2: 24 mmol/L (ref 20–29)
Calcium: 9.2 mg/dL (ref 8.6–10.2)
Chloride: 99 mmol/L (ref 96–106)
Creatinine, Ser: 0.78 mg/dL (ref 0.76–1.27)
Globulin, Total: 1.9 g/dL (ref 1.5–4.5)
Glucose: 99 mg/dL (ref 70–99)
Potassium: 4.1 mmol/L (ref 3.5–5.2)
Sodium: 137 mmol/L (ref 134–144)
Total Protein: 6.6 g/dL (ref 6.0–8.5)
eGFR: 84 mL/min/1.73

## 2021-05-11 LAB — LIPID PANEL W/O CHOL/HDL RATIO
Cholesterol, Total: 116 mg/dL (ref 100–199)
HDL: 54 mg/dL
LDL Chol Calc (NIH): 46 mg/dL (ref 0–99)
Triglycerides: 79 mg/dL (ref 0–149)
VLDL Cholesterol Cal: 16 mg/dL (ref 5–40)

## 2021-05-13 ENCOUNTER — Ambulatory Visit (INDEPENDENT_AMBULATORY_CARE_PROVIDER_SITE_OTHER): Payer: Medicare Other | Admitting: *Deleted

## 2021-05-13 DIAGNOSIS — Z0000 Encounter for general adult medical examination without abnormal findings: Secondary | ICD-10-CM

## 2021-05-13 NOTE — Patient Instructions (Signed)
Nicholas Gross , Thank you for taking time to come for your Medicare Wellness Visit. I appreciate your ongoing commitment to your health goals. Please review the following plan we discussed and let me know if I can assist you in the future.   Screening recommendations/referrals: Colonoscopy: no longer indicated Recommended yearly ophthalmology/optometry visit for glaucoma screening and checkup Recommended yearly dental visit for hygiene and checkup  Vaccinations: Influenza vaccine: up to date Pneumococcal vaccine: Education provided Tdap vaccine: Education provided Shingles vaccine: Education provided    Advanced directives: Education provided  Conditions/risks identified:   Next appointment: Case manager telephone 05-14-2021     11-08-2021 @ 10:00 [facility]  Preventive Care 85 Years and Older, Male Preventive care refers to lifestyle choices and visits with your health care provider that can promote health and wellness. What does preventive care include? A yearly physical exam. This is also called an annual well check. Dental exams once or twice a year. Routine eye exams. Ask your health care provider how often you should have your eyes checked. Personal lifestyle choices, including: Daily care of your teeth and gums. Regular physical activity. Eating a healthy diet. Avoiding tobacco and drug use. Limiting alcohol use. Practicing safe sex. Taking low doses of aspirin every day. Taking vitamin and mineral supplements as recommended by your health care provider. What happens during an annual well check? The services and screenings done by your health care provider during your annual well check will depend on your age, overall health, lifestyle risk factors, and family history of disease. Counseling  Your health care provider may ask you questions about your: Alcohol use. Tobacco use. Drug use. Emotional well-being. Home and relationship well-being. Sexual activity. Eating  habits. History of falls. Memory and ability to understand (cognition). Work and work Astronomer. Screening  You may have the following tests or measurements: Height, weight, and BMI. Blood pressure. Lipid and cholesterol levels. These may be checked every 5 years, or more frequently if you are over 85 years old. Skin check. Lung cancer screening. You may have this screening every year starting at age 85 if you have a 30-pack-year history of smoking and currently smoke or have quit within the past 15 years. Fecal occult blood test (FOBT) of the stool. You may have this test every year starting at age 85. Flexible sigmoidoscopy or colonoscopy. You may have a sigmoidoscopy every 5 years or a colonoscopy every 10 years starting at age 85. Prostate cancer screening. Recommendations will vary depending on your family history and other risks. Hepatitis C blood test. Hepatitis B blood test. Sexually transmitted disease (STD) testing. Diabetes screening. This is done by checking your blood sugar (glucose) after you have not eaten for a while (fasting). You may have this done every 1-3 years. Abdominal aortic aneurysm (AAA) screening. You may need this if you are a current or former smoker. Osteoporosis. You may be screened starting at age 85 if you are at high risk. Talk with your health care provider about your test results, treatment options, and if necessary, the need for more tests. Vaccines  Your health care provider may recommend certain vaccines, such as: Influenza vaccine. This is recommended every year. Tetanus, diphtheria, and acellular pertussis (Tdap, Td) vaccine. You may need a Td booster every 10 years. Zoster vaccine. You may need this after age 85. Pneumococcal 13-valent conjugate (PCV13) vaccine. One dose is recommended after age 85. Pneumococcal polysaccharide (PPSV23) vaccine. One dose is recommended after age 85. Talk to your health care  provider about which screenings and  vaccines you need and how often you need them. This information is not intended to replace advice given to you by your health care provider. Make sure you discuss any questions you have with your health care provider. Document Released: 07/06/2015 Document Revised: 02/27/2016 Document Reviewed: 04/10/2015 Elsevier Interactive Patient Education  2017 [place] Prevention in the Home Falls can cause injuries. They can happen to people of all ages. There are many things you can do to make your home safe and to help prevent falls. What can I do on the outside of my home? Regularly fix the edges of walkways and driveways and fix any cracks. Remove anything that might make you trip as you walk through a door, such as a raised step or threshold. Trim any bushes or trees on the path to your home. Use bright outdoor lighting. Clear any walking paths of anything that might make someone trip, such as rocks or tools. Regularly check to see if handrails are loose or broken. Make sure that both sides of any steps have handrails. Any raised decks and porches should have guardrails on the edges. Have any leaves, snow, or ice cleared regularly. Use sand or salt on walking paths during winter. Clean up any spills in your garage right away. This includes oil or grease spills. What can I do in the bathroom? Use night lights. Install grab bars by the toilet and in the tub and shower. Do not use towel bars as grab bars. Use non-skid mats or decals in the tub or shower. If you need to sit down in the shower, use a plastic, non-slip stool. Keep the floor dry. Clean up any water that spills on the floor as soon as it happens. Remove soap buildup in the tub or shower regularly. Attach bath mats securely with double-sided non-slip rug tape. Do not have throw rugs and other things on the floor that can make you trip. What can I do in the bedroom? Use night lights. Make sure that you have a light by your  bed that is easy to reach. Do not use any sheets or blankets that are too big for your bed. They should not hang down onto the floor. Have a firm chair that has side arms. You can use this for support while you get dressed. Do not have throw rugs and other things on the floor that can make you trip. What can I do in the kitchen? Clean up any spills right away. Avoid walking on wet floors. Keep items that you use a lot in easy-to-reach places. If you need to reach something above you, use a strong step stool that has a grab bar. Keep electrical cords out of the way. Do not use floor polish or wax that makes floors slippery. If you must use wax, use non-skid floor wax. Do not have throw rugs and other things on the floor that can make you trip. What can I do with my stairs? Do not leave any items on the stairs. Make sure that there are handrails on both sides of the stairs and use them. Fix handrails that are broken or loose. Make sure that handrails are as long as the stairways. Check any carpeting to make sure that it is firmly attached to the stairs. Fix any carpet that is loose or worn. Avoid having throw rugs at the top or bottom of the stairs. If you do have throw rugs, attach them to the  floor with carpet tape. Make sure that you have a light switch at the top of the stairs and the bottom of the stairs. If you do not have them, ask someone to add them for you. What else can I do to help prevent falls? Wear shoes that: Do not have high heels. Have rubber bottoms. Are comfortable and fit you well. Are closed at the toe. Do not wear sandals. If you use a stepladder: Make sure that it is fully opened. Do not climb a closed stepladder. Make sure that both sides of the stepladder are locked into place. Ask someone to hold it for you, if possible. Clearly mark and make sure that you can see: Any grab bars or handrails. First and last steps. Where the edge of each step is. Use tools that  help you move around (mobility aids) if they are needed. These include: Canes. Walkers. Scooters. Crutches. Turn on the lights when you go into a dark area. Replace any light bulbs as soon as they burn out. Set up your furniture so you have a clear path. Avoid moving your furniture around. If any of your floors are uneven, fix them. If there are any pets around you, be aware of where they are. Review your medicines with your doctor. Some medicines can make you feel dizzy. This can increase your chance of falling. Ask your doctor what other things that you can do to help prevent falls. This information is not intended to replace advice given to you by your health care provider. Make sure you discuss any questions you have with your health care provider. Document Released: 04/05/2009 Document Revised: 11/15/2015 Document Reviewed: 07/14/2014 Elsevier Interactive Patient Education  2017 [employer].

## 2021-05-13 NOTE — Progress Notes (Signed)
Subjective:   Nicholas Gross is a 87 y.o. male who presents for Medicare Annual/Subsequent preventive examination.  I connected with  Nicholas Gross on 05/13/21 by a telephone enabled telemedicine application and verified that I am speaking with the correct person using two identifiers.   I discussed the limitations of evaluation and management by telemedicine. The patient expressed understanding and agreed to proceed.  Patient location: home  Provider location: Tele- Health not in office    Review of Systems     Cardiac Risk Factors include: advanced age (>32men, >59 women);hypertension;male gender     Objective:    Today's Vitals   05/13/21 0951  PainSc: 2    There is no height or weight on file to calculate BMI.  Advanced Directives 05/13/2021 10/19/2019 08/30/2018 08/26/2017  Does Patient Have a Medical Advance Directive? No No No No  Would patient like information on creating a medical advance directive? No - Patient declined - No - Patient declined No - Patient declined    Current Medications (verified) Outpatient Encounter Medications as of 05/13/2021  Medication Sig   aspirin EC 81 MG tablet Take 1 tablet (81 mg total) by mouth daily.   lisinopril (ZESTRIL) 5 MG tablet Take 1 tablet (5 mg total) by mouth daily.   Multiple Vitamins-Minerals (PRESERVISION AREDS PO) Take by mouth 2 (two) times daily.   rosuvastatin (CRESTOR) 10 MG tablet Take 1 tablet (10 mg total) by mouth daily.   No facility-administered encounter medications on file as of 05/13/2021.    Allergies (verified) Other and Simvastatin   History: Past Medical History:  Diagnosis Date   CAD (coronary artery disease)    History of GI bleed 2008   History of tobacco use    17 pack years, quit around 1970   Hyperlipidemia    Hypertension    Overweight    Past Surgical History:  Procedure Laterality Date   CAROTID ENDARTERECTOMY Left 2010   CATARACT EXTRACTION     cypher stent  09/12/02    s/p cypher stent mid-LAD   TENDON REPAIR  1991   finger and arm   Family History  Problem Relation Age of Onset   Heart disease Father        possibly   Cancer Sister        breast   AAA (abdominal aortic aneurysm) Brother    Cancer Sister        lung   Social History   Socioeconomic History   Marital status: Widowed    Spouse name: Not on file   Number of children: Not on file   Years of education: some college in [employer]    Highest education level: High school graduate  Occupational History   Occupation: retired   Tobacco Use   Smoking status: Former    Packs/day: 1.00    Years: 17.00    Pack years: 17.00    Types: Cigarettes    Quit date: 06/23/1968    Years since quitting: 52.9   Smokeless tobacco: Never  Vaping Use   Vaping Use: Never used  Substance and Sexual Activity   Alcohol use: No   Drug use: No   Sexual activity: Not Currently  Other Topics Concern   Not on file  Social History Narrative   Attends church, neighbor take him    Social Determinants of Health   Financial Resource Strain: Low Risk    Difficulty of Paying Living Expenses: Not hard at all  Food  Insecurity: No Food Insecurity   Worried About Programme researcher, broadcasting/film/video in the Last Year: Never true   Ran Out of Food in the Last Year: Never true  Transportation Needs: No Transportation Needs   Lack of Transportation (Medical): No   Lack of Transportation (Non-Medical): No  Physical Activity: Insufficiently Active   Days of Exercise per Week: 2 days   Minutes of Exercise per Session: 40 min  Stress: No Stress Concern Present   Feeling of Stress : Not at all  Social Connections: Moderately Isolated   Frequency of Communication with Friends and Family: Once a week   Frequency of Social Gatherings with Friends and Family: Twice a week   Attends Religious Services: More than 4 times per year   Active Member of [employer] or Organizations: No   Attends Banker Meetings: Never   Marital Status:  Widowed    Tobacco Counseling Counseling given: Not Answered   Clinical Intake:  Pre-visit preparation completed: Yes  Pain : 0-10 Pain Score: 2  Pain Type: Chronic pain Pain Location: Back Pain Descriptors / Indicators: Burning, Aching Pain Onset: More than a month ago     Nutritional Risks: None Diabetes: No  How often do you need to have someone help you when you read instructions, pamphlets, or other written materials from your doctor or pharmacy?: 1 - Never  Diabetic?no  Interpreter Needed?: No  Information entered by :: Remi Haggard LPN   Activities of Daily Living In your present state of health, do you have any difficulty performing the following activities: 05/13/2021 05/10/2021  Hearing? N N  Vision? Y N  Difficulty concentrating or making decisions? N N  Walking or climbing stairs? N N  Dressing or bathing? N N  Doing errands, shopping? Y N  Comment nees rides -  Quarry manager and eating ? N -  Using the Toilet? N -  In the past six months, have you accidently leaked urine? N -  Do you have problems with loss of bowel control? N -  Managing your Medications? N -  Managing your Finances? N -  Housekeeping or managing your Housekeeping? N -  Some recent data might be hidden    Patient Care Team: Dorcas Carrow, DO as PCP - General (Family Medicine) Wyn Quaker, Marlow Baars, MD as Referring Physician (Vascular Surgery) Marlowe Sax, RN as Case Manager (General Practice)  Indicate any recent Medical Services you may have received from other than [facility] providers in the past year (date may be approximate).     Assessment:   This is a routine wellness examination for Nicholas Gross.  Hearing/Vision screen Hearing Screening - Comments:: No trouble hearing Vision Screening - Comments:: Not up to date [facility]  Dietary issues and exercise activities discussed: Current Exercise Habits: Home exercise routine, Type of exercise: strength training/weights;walking  (push ups), Time (Minutes): 20, Frequency (Times/Week): 3, Weekly Exercise (Minutes/Week): 60, Intensity: Mild   Goals Addressed             This Visit's Progress    Patient Stated       No goals        Depression Screen PHQ 2/9 Scores 05/13/2021 10/30/2020 10/19/2019 08/09/2019 08/30/2018 08/26/2017 08/21/2016  PHQ - 2 Score 0 0 0 0 0 0 0  PHQ- 9 Score - - - - 0 1 -    Fall Risk Fall Risk  05/13/2021 05/10/2021 10/19/2019 08/30/2018 08/26/2017  Falls in the past year? 0 0 0 0  No  Number falls in past yr: 0 0 0 0 -  Injury with Fall? 0 0 0 0 -  Risk for fall due to : - No Fall Risks - - -  Follow up Falls evaluation completed;Falls prevention discussed Falls evaluation completed - Falls evaluation completed -    FALL RISK PREVENTION PERTAINING TO THE HOME:  Any stairs in or around the home? No  If so, are there any without handrails? No  Home free of loose throw rugs in walkways, pet beds, electrical cords, etc? Yes  Adequate lighting in your home to reduce risk of falls? Yes   ASSISTIVE DEVICES UTILIZED TO PREVENT FALLS:  Life alert? No  Use of a cane, walker or w/c? No  Grab bars in the bathroom? No  Shower chair or bench in shower? No  Elevated toilet seat or a handicapped toilet? No   TIMED UP AND GO:  Was the test performed? No .    Cognitive Function:     6CIT Screen 05/13/2021 08/30/2018 08/26/2017 08/21/2016  What Year? 0 points 0 points 0 points 0 points  What month? 0 points 0 points 0 points 0 points  What time? 0 points 0 points 0 points 0 points  Count back from 20 0 points 0 points 0 points 0 points  Months in reverse 2 points 0 points 0 points 0 points  Repeat phrase 2 points 2 points 4 points 2 points  Total Score 4 2 4 2     Immunizations Immunization History  Administered Date(s) Administered   Fluad Quad(high Dose 65+) 03/18/2019, 05/08/2020   Influenza, High Dose Seasonal PF 02/26/2018, 04/16/2018   Influenza-Unspecified 04/18/2015, 04/10/2016,  04/21/2017, 05/02/2021   PFIZER(Purple Top)SARS-COV-2 Vaccination 07/14/2019, 08/04/2019, 04/02/2020   Pneumococcal Polysaccharide-23 06/23/1998    TDAP status: Due, Education has been provided regarding the importance of this vaccine. Advised may receive this vaccine at local pharmacy or Health Dept. Aware to provide a copy of the vaccination record if obtained from local pharmacy or Health Dept. Verbalized acceptance and understanding.  Flu Vaccine status: Up to date  Pneumococcal vaccine status: Declined,  Education has been provided regarding the importance of this vaccine but patient still declined. Advised may receive this vaccine at local pharmacy or Health Dept. Aware to provide a copy of the vaccination record if obtained from local pharmacy or Health Dept. Verbalized acceptance and understanding.   Covid-19 vaccine status: Information provided on how to obtain vaccines.   Qualifies for Shingles Vaccine? Yes   Zostavax completed No   Shingrix Completed?: No.    Education has been provided regarding the importance of this vaccine. Patient has been advised to call insurance company to determine out of pocket expense if they have not yet received this vaccine. Advised may also receive vaccine at local pharmacy or Health Dept. Verbalized acceptance and understanding.  Screening Tests Health Maintenance  Topic Date Due   COVID-19 Vaccine (4 - Booster for Pfizer series) 05/28/2020   Zoster Vaccines- Shingrix (1 of 2) 08/13/2021 (Originally 07/11/1977)   TETANUS/TDAP  11/05/2021 (Originally 07/10/1946)   Pneumonia Vaccine 81+ Years old (2 - PCV) 05/10/2022 (Originally 06/24/1999)   INFLUENZA VACCINE  Completed   HPV VACCINES  Aged Out    Health Maintenance  Health Maintenance Due  Topic Date Due   COVID-19 Vaccine (4 - Booster for Pfizer series) 05/28/2020    Colorectal cancer screening: No longer required.   Lung Cancer Screening: (Low Dose CT Chest recommended if Age 46-80 years,  30 pack-year currently smoking OR have quit w/in 15years.) does not qualify.   Lung Cancer Screening Referral:   Additional Screening:  Hepatitis C Screening: does not qualify;   Vision Screening: Recommended annual ophthalmology exams for early detection of glaucoma and other disorders of the eye. Is the patient up to date with their annual eye exam?  No  Who is the provider or what is the name of the office in which the patient attends annual eye exams? [facility] If pt is not established with a provider, would they like to be referred to a provider to establish care? No .   Dental Screening: Recommended annual dental exams for proper oral hygiene  Community Resource Referral / Chronic Care Management: CRR required this visit?  No   CCM required this visit?  No      Plan:     I have personally reviewed and noted the following in the patient's chart:   Medical and social history Use of alcohol, tobacco or illicit drugs  Current medications and supplements including opioid prescriptions. Patient is not currently taking opioid prescriptions. Functional ability and status Nutritional status Physical activity Advanced directives List of other physicians Hospitalizations, surgeries, and ER visits in previous 12 months Vitals Screenings to include cognitive, depression, and falls Referrals and appointments  In addition, I have reviewed and discussed with patient certain preventive protocols, quality metrics, and best practice recommendations. A written personalized care plan for preventive services as well as general preventive health recommendations were provided to patient.     Remi Haggard, LPN   [id number]   Nurse Notes:

## 2021-05-14 ENCOUNTER — Ambulatory Visit (INDEPENDENT_AMBULATORY_CARE_PROVIDER_SITE_OTHER): Payer: Medicare Other

## 2021-05-14 ENCOUNTER — Telehealth: Payer: Self-pay

## 2021-05-14 ENCOUNTER — Telehealth: Payer: Medicare Other

## 2021-05-14 DIAGNOSIS — I2510 Atherosclerotic heart disease of native coronary artery without angina pectoris: Secondary | ICD-10-CM

## 2021-05-14 DIAGNOSIS — E782 Mixed hyperlipidemia: Secondary | ICD-10-CM

## 2021-05-14 DIAGNOSIS — I10 Essential (primary) hypertension: Secondary | ICD-10-CM

## 2021-05-14 NOTE — Patient Instructions (Signed)
Visit Information  Thank you for taking time to visit with me today. Please don't hesitate to contact me if I can be of assistance to you before our next scheduled telephone appointment.  Following are the goals we discussed today:  RNCM Clinical Goal(s):  Patient will verbalize basic understanding of CAD, HTN, and HLD disease process and self health management plan as evidenced by keeping appointments, following plan of care, and working with the CCM team to effectively manage health and well being.  take all medications exactly as prescribed and will call provider for medication related questions as evidenced by taking medications as prescribed and calling for refills before running out of medications     attend all scheduled medical appointments: 11-08-2021 at 10 am as evidenced by keeping appointments and calling the office for any schedule change needs         demonstrate improved and ongoing adherence to prescribed treatment plan for CAD, HTN, and HLD as evidenced by stable conditions, no acute changes in health and well being, and working with the CCM team for new needs or concerns demonstrate ongoing self health care management ability for effective management of chronic conditions as evidenced by working with the CCM team through collaboration with Consulting civil engineer, provider, and care team.    Interventions: 1:1 collaboration with primary care provider regarding development and update of comprehensive plan of care as evidenced by provider attestation and co-signature Inter-disciplinary care team collaboration (see longitudinal plan of care) Evaluation of current treatment plan related to  self management and patient's adherence to plan as established by provider     CAD  (Status: Goal on Track (progressing): YES.) Long Term Goal  Assessed understanding of CAD diagnosis Medications reviewed including medications utilized in CAD treatment plan Provided education on importance of blood  pressure control in management of CAD; Provided education on Importance of limiting foods high in cholesterol; Counseled on importance of regular laboratory monitoring as prescribed; Counseled on the importance of exercise goals with target of 150 minutes per week Reviewed Importance of taking all medications as prescribed Reviewed Importance of attending all scheduled provider appointments Advised to report any changes in symptoms or exercise tolerance Screening for signs and symptoms of depression related to chronic disease state;  Assessed social determinant of health barriers;    Hyperlipidemia:  (Status: Goal on Track (progressing): YES.) Long Term Goal       Lab Results  Component Value Date    CHOL 116 05/10/2021    HDL 54 05/10/2021    LDLCALC 46 05/10/2021    TRIG 79 05/10/2021      Medication review performed; medication list updated in electronic medical record.  Provider established cholesterol goals reviewed; Counseled on importance of regular laboratory monitoring as prescribed; Provided HLD educational materials; Reviewed role and benefits of statin for ASCVD risk reduction; Discussed strategies to manage statin-induced myalgias; Reviewed importance of limiting foods high in cholesterol; Reviewed exercise goals and target of 150 minutes per week;   Hypertension: (Status: Goal on Track (progressing): YES.) Last practice recorded BP readings:     BP Readings from Last 3 Encounters:  05/10/21 (!) 168/72  11/05/20 (!) 169/77  07/16/20 140/88  Most recent eGFR/CrCl:       Lab Results  Component Value Date    EGFR 84 05/10/2021    No components found for: CRCL   Evaluation of current treatment plan related to hypertension self management and patient's adherence to plan as established by provider.  05-14-2021: The patient is doing well. Saw pcp recently. The patient has usual elevated blood pressure readings in the office. The patient states that he is still very  active and does his push-ups and walking daily. The patient states he does not sleep a lot at night but he goes to bed early and sometimes gets up and sleeps in the recliner the rest of the night, sometimes takes naps during the day. Is looking forward to being with his family for Thanksgiving. States they will come and get him and take him to their house and then bring him back home;   Provided education to patient re: stroke prevention, s/s of heart attack and stroke; Reviewed prescribed diet heart healthy Reviewed medications with patient and discussed importance of compliance;  Discussed plans with patient for ongoing care management follow up and provided patient with direct contact information for care management team; Advised patient, providing education and rationale, to monitor blood pressure daily and record, calling PCP for findings outside established parameters;  Advised patient to discuss blood pressure trends with provider; Provided education on prescribed diet heart healthy;  Discussed complications of poorly controlled blood pressure such as heart disease, stroke, circulatory complications, vision complications, kidney impairment, sexual dysfunction;  Evaluation of white coat syndrome, the patient has known history of white coat syndrome. Denies any issues with blood pressures at home.    Patient Goals/Self-Care Activities: Take medications as prescribed   Attend all scheduled provider appointments Call pharmacy for medication refills 3-7 days in advance of running out of medications Attend church or other social activities Perform all self care activities independently  Perform IADL's (shopping, preparing meals, housekeeping, managing finances) independently Call provider office for new concerns or questions  Work with the social worker to address care coordination needs and will continue to work with the clinical team to address health care and disease management related  needs call 911 call the Suicide and Crisis Lifeline: 988 call the [place] National Suicide Prevention Lifeline: (973)140-2302 call 1-800-273-TALK (toll free, 24 hour hotline) if experiencing a Mental Health or [place]  check blood pressure weekly choose a place to take my blood pressure (home, clinic or office, retail store) write blood pressure results in a log or diary learn about high blood pressure keep a blood pressure log take blood pressure log to all doctor appointments call doctor for signs and symptoms of high blood pressure develop an action plan for high blood pressure keep all doctor appointments take medications for blood pressure exactly as prescribed report new symptoms to your doctor eat more whole grains, fruits and vegetables, lean meats and healthy fats - call for medicine refill 2 or 3 days before it runs out - take all medications exactly as prescribed - call doctor with any symptoms you believe are related to your medicine - call doctor when you experience any new symptoms - go to all doctor appointments as scheduled - adhere to prescribed diet: heart healthy diet       Our next appointment is by telephone on 07-09-2021 at 1030 am   Please call the care guide team at [phone] if you need to cancel or reschedule your appointment.   Please call 911 call the Suicide and Crisis Lifeline: 988 call the [place] National Suicide Prevention Lifeline: (267)338-0497 call 1-800-273-TALK (toll free, 24 hour hotline) if you are experiencing a Mental Health or [place] or need someone to talk to.  The patient verbalized understanding of instructions, educational materials,  and care plan provided today and declined offer to receive copy of patient instructions, educational materials, and care plan.   Noreene Larsson RN, MSN, [facility] Mobile: [phone]

## 2021-05-14 NOTE — Telephone Encounter (Signed)
  Care Management   Follow Up Note   05/14/2021 Name: Nicholas Gross MRN: [id number] DOB: 1927/08/15   Referred by: Dorcas Carrow, DO Reason for referral : Chronic Care Management (RNCM: Follow up for Chronic Disease Management and Care Coordination Needs )   The RNCM called the patient back and the call was completed. See new encounter for information.   Follow Up Plan: Telephone follow up appointment with care management team member scheduled for: 07-09-2021 at 1030 am  [name] RN, MSN, CCM Community Care Coordinator [place]  [facility] Mobile: [phone]

## 2021-05-14 NOTE — Chronic Care Management (AMB) (Signed)
Chronic Care Management   CCM RN Visit Note  05/14/2021 Name: Nicholas Gross MRN: [id number] DOB: 04/28/1928  Subjective: Nicholas Gross is a 87 y.o. year old male who is a primary care patient of [name], DO. The care management team was consulted for assistance with disease management and care coordination needs.    Engaged with patient by telephone for follow up visit in response to provider referral for case management and/or care coordination services.   Consent to Services:  The patient was given information about Chronic Care Management services, agreed to services, and gave verbal consent prior to initiation of services.  Please see initial visit note for detailed documentation.   Patient agreed to services and verbal consent obtained.   Assessment: Review of patient past medical history, allergies, medications, health status, including review of consultants reports, laboratory and other test data, was performed as part of comprehensive evaluation and provision of chronic care management services.   SDOH (Social Determinants of Health) assessments and interventions performed:    CCM Care Plan  Allergies  Allergen Reactions   Other     Patient states that he is allergic to something that they place in the IV before they work on you   Simvastatin Other (See Comments)    Myalgia     Outpatient Encounter Medications as of 05/14/2021  Medication Sig   aspirin EC 81 MG tablet Take 1 tablet (81 mg total) by mouth daily.   lisinopril (ZESTRIL) 5 MG tablet Take 1 tablet (5 mg total) by mouth daily.   Multiple Vitamins-Minerals (PRESERVISION AREDS PO) Take by mouth 2 (two) times daily.   rosuvastatin (CRESTOR) 10 MG tablet Take 1 tablet (10 mg total) by mouth daily.   No facility-administered encounter medications on file as of 05/14/2021.    Patient Active Problem List   Diagnosis Date Noted   PAD (peripheral artery disease) ([place]) 07/15/2020   Lymphedema  05/06/2019   Hyperlipemia 02/13/2016   Macular degeneration 08/15/2015   Carotid atherosclerosis 08/15/2015   Allergic rhinitis due to pollen 02/12/2015   Hypertension    CAD (coronary artery disease)    H/O cardiac catheterization 06/12/2014   History of left-sided carotid endarterectomy 06/12/2014    Conditions to be addressed/monitored:CAD, HTN, and HLD  Care Plan : RNCM: Hypertension (Adult)  Updates made by Vanita Ingles, RN since 05/14/2021 12:00 AM  Completed 05/14/2021   Problem: RNCM; Hypertension (Hypertension) Resolved 05/14/2021  Priority: Medium     Long-Range Goal: RNCM: Hypertension Monitored Completed 05/14/2021  Start Date: 10/30/2020  Expected End Date: 10/28/2021  Recent Progress: On track  Priority: High  Note:   Objective: Resolving, duplicate goal Last practice recorded BP readings:  BP Readings from Last 3 Encounters:  11/05/20 (!) 169/77  07/16/20 140/88  05/08/20 (!) 170/76    Most recent eGFR/CrCl: No results found for: EGFR  No components found for: CRCL Current Barriers:  Knowledge Deficits related to basic understanding of hypertension pathophysiology and self care management Knowledge Deficits related to understanding of medications prescribed for management of hypertension Limited Social Support Lacks social connections Does not contact provider office for questions/concerns Case Manager Clinical Goal(s):  patient will verbalize understanding of plan for hypertension management patient will attend all scheduled medical appointments: 05-08-2021 at 0920 am patient will demonstrate improved adherence to prescribed treatment plan for hypertension as evidenced by taking all medications as prescribed, monitoring and recording blood pressure as directed, adhering to low sodium/DASH diet patient will demonstrate  improved health management independence as evidenced by checking blood pressure as directed and notifying PCP if SBP>160 or DBP > 90, taking  all medications as prescribe, and adhering to a low sodium diet as discussed. patient will verbalize basic understanding of hypertension disease process and self health management plan as evidenced by compliance with medications, compliance with heart healthy diet, and working with the CCM team to manage  health and well being.  Interventions:  Collaboration with [name], DO regarding development and update of comprehensive plan of care as evidenced by provider attestation and co-signature Inter-disciplinary care team collaboration (see longitudinal plan of care) Evaluation of current treatment plan related to hypertension self management and patient's adherence to plan as established by provider.01-01-2021: The patients pressure was elevated in the office but the patient states that his pressures are always elevated in the office. He takes it at home and it is normal range. He denies any issues with his blood pressure and does not take medications for it. Does exercise regularly. Walks several miles a day and does 40 push ups daily. Had already completed his push ups at the time of the call. 03-12-2021: The patient continues to do well. Has walked a mile today and done 45 push ups today. States he will walk more today and feels great, he is having a good day. Denies any new concerns with his cardiac health. States that he is taking a "memory pill" but does not see where that is helping with his memory. The patient states he can remember a lot from when he was younger but not so much now. He could recall dates of service in the [employer] and other things like that. He denies any acute distress.  Provided education to patient re: stroke prevention, s/s of heart attack and stroke, DASH diet, complications of uncontrolled blood pressure Reviewed medications with patient and discussed importance of compliance. 03-12-2021: Takes no medications for blood pressure but is compliant with medications Discussed plans  with patient for ongoing care management follow up and provided patient with direct contact information for care management team Advised patient, providing education and rationale, to monitor blood pressure daily and record, calling PCP for findings outside established parameters.  Reviewed scheduled/upcoming provider appointments including: 05-08-2021 0920 am  Self-Care Activities: - Self administers medications as prescribed Attends all scheduled provider appointments Calls provider office for new concerns, questions, or BP outside discussed parameters Checks BP and records as discussed Follows a low sodium diet/DASH diet Patient Goals: - check blood pressure weekly - choose a place to take my blood pressure (home, clinic or office, retail store) - write blood pressure results in a log or diary - agree on reward when goals are met - agree to work together to make changes - ask questions to understand - have a family meeting to talk about healthy habits - learn about high blood pressure - blood pressure trends reviewed - depression screen reviewed - home or ambulatory blood pressure monitoring encouraged Follow Up Plan: Telephone follow up appointment with care management team member scheduled for: 05-14-2021 at 1030 am    Care Plan : [facility]; Coronary Artery Disease (Adult) and HLD  Updates made by Vanita Ingles, RN since 05/14/2021 12:00 AM  Completed 05/14/2021   Problem: RNCM: Disease Progression (Coronary Artery Disease) and HLD Resolved 05/14/2021  Priority: Medium     Long-Range Goal: RNCM: Disease Progression Prevented or Minimized and HLD Completed 05/14/2021  Start Date: 10/30/2020  Expected End Date: 10/28/2021  Recent Progress: On track  Priority: Medium  Note:   Current Barriers: Resolving, duplicate goal  Poorly controlled hyperlipidemia, complicated by HTN Current antihyperlipidemic regimen: crestor 10 mg QD Most recent lipid panel:     Component Value Date/Time    CHOL 107 11/05/2020 1010   CHOL 123 02/13/2016 0834   TRIG 119 11/05/2020 1010   TRIG 114 02/13/2016 0834   HDL 44 11/05/2020 1010   VLDL 23 02/13/2016 0834   LDLCALC 42 11/05/2020 1010   ASCVD risk enhancing conditions: age >61, HTN,  former smoker Lacks Microbiologist Clinical Goal(s):  patient will work with Consulting civil engineer, providers, and care team towards execution of optimized self-health management plan patient will verbalize understanding of plan for effective management of HLD and CAD patient will work with [facility] and pcp to address needs related to management of HLD and CAD patient will attend all scheduled medical appointments: 05-08-2021 at 0920 am Interventions: Collaboration with [name], DO regarding development and update of comprehensive plan of care as evidenced by provider attestation and co-signature Inter-disciplinary care team collaboration (see longitudinal plan of care) Medication review performed; medication list updated in electronic medical record.  Inter-disciplinary care team collaboration (see longitudinal plan of care) Referred to pharmacy team for assistance with HLD medication management Evaluation of current treatment plan related to HLD and CAD and patient's adherence to plan as established by provider. 03-12-2021: The patient is adherent to a heart healthy diet, is mindful of sodium in foods and gets the canned vegetables with no salt added. States he feels great. Says he is working on improving his memory. He can remember things about his childhood but he cannot remember things current.  He states he is not forgetting to take his medications but he feels his memory should be better. Reflective listening and support given. Only sleeps about 4 hours a night but naps during the day. Denies any new concerns at this time. Usual bedtime is 0930 and gets up at 330 am each morning. Breakfast is usually about 4 am, lunch at 11 am, and  supper about 4 pm. States he is doing well and denies any new concerns. Will continue to monitor.  Advised patient to call the office for changes or questions, also reminded the patient of his appointment on 05-08-2021 with Dr. Wynetta Emery.  Provided education to patient re: to continue with heart healthy diet, current exercise regimen, and healthy habits.  Reviewed scheduled/upcoming provider appointments including: 05-08-2021 at 0920 am Discussed plans with patient for ongoing care management follow up and provided patient with direct contact information for care management team Patient Goals/Self-Care Activities: - call for medicine refill 2 or 3 days before it runs out - call if I am sick and can't take my medicine - keep a list of all the medicines I take; vitamins and herbals too - learn to read medicine labels - use a pillbox to sort medicine - use an alarm clock or phone to remind me to take my medicine - change to whole grain breads, cereal, pasta - drink 6 to 8 glasses of water each day - eat 3 to 5 servings of fruits and vegetables each day - eat 5 or 6 small meals each day - eat fish at least once per week - fill half the plate with nonstarchy vegetables - limit fast food meals to no more than 1 per week - manage portion size - prepare main meal at home 3 to 5  days each week - read food labels for fat, fiber, carbohydrates and portion size - be open to making changes - I can manage, know and watch for signs of a heart attack - if I have chest pain, call for help - learn about small changes that will make a big difference - learn my personal risk factors  - barriers to treatment adherence reviewed and addressed - functional limitation screening reviewed - healthy lifestyle promoted - medication-adherence assessment completed - rescue (action) plan developed - response to pharmacologic therapy monitored - self-awareness of signs/symptoms of worsening disease encouraged Follow  Up Plan:  Telephone follow up appointment with care management team member scheduled for: 05-14-2021 at 81 am       Care Plan : RNCM: General Plan of Care (Adult) for Chronic Disease Management and Care Coordination Needs  Updates made by Vanita Ingles, RN since 05/14/2021 12:00 AM     Problem: RNCM: Development of Plan of Care for Chronic Disease Management (HTN, HLD, CAD)   Priority: High     Long-Range Goal: RNCM: Effective Management  of Plan of Care for Chronic Disease Management (HTN, HLD, CAD)   Start Date: 05/14/2021  Expected End Date: 05/14/2022  Priority: High  Note:   Current Barriers:  Knowledge Deficits related to plan of care for management of CAD, HTN, and HLD  Chronic Disease Management support and education needs related to CAD, HTN, and HLD  RNCM Clinical Goal(s):  Patient will verbalize basic understanding of CAD, HTN, and HLD disease process and self health management plan as evidenced by keeping appointments, following plan of care, and working with the CCM team to effectively manage health and well being.  take all medications exactly as prescribed and will call provider for medication related questions as evidenced by taking medications as prescribed and calling for refills before running out of medications     attend all scheduled medical appointments: 11-08-2021 at 10 am as evidenced by keeping appointments and calling the office for any schedule change needs         demonstrate improved and ongoing adherence to prescribed treatment plan for CAD, HTN, and HLD as evidenced by stable conditions, no acute changes in health and well being, and working with the CCM team for new needs or concerns demonstrate ongoing self health care management ability for effective management of chronic conditions as evidenced by working with the CCM team through collaboration with Consulting civil engineer, provider, and care team.   Interventions: 1:1 collaboration with primary care  provider regarding development and update of comprehensive plan of care as evidenced by provider attestation and co-signature Inter-disciplinary care team collaboration (see longitudinal plan of care) Evaluation of current treatment plan related to  self management and patient's adherence to plan as established by provider   CAD  (Status: Goal on Track (progressing): YES.) Long Term Goal  Assessed understanding of CAD diagnosis Medications reviewed including medications utilized in CAD treatment plan Provided education on importance of blood pressure control in management of CAD; Provided education on Importance of limiting foods high in cholesterol; Counseled on importance of regular laboratory monitoring as prescribed; Counseled on the importance of exercise goals with target of 150 minutes per week Reviewed Importance of taking all medications as prescribed Reviewed Importance of attending all scheduled provider appointments Advised to report any changes in symptoms or exercise tolerance Screening for signs and symptoms of depression related to chronic disease state;  Assessed social determinant of health barriers;   Hyperlipidemia:  (  Status: Goal on Track (progressing): YES.) Long Term Goal  Lab Results  Component Value Date   CHOL 116 05/10/2021   HDL 54 05/10/2021   LDLCALC 46 05/10/2021   TRIG 79 05/10/2021     Medication review performed; medication list updated in electronic medical record.  Provider established cholesterol goals reviewed; Counseled on importance of regular laboratory monitoring as prescribed; Provided HLD educational materials; Reviewed role and benefits of statin for ASCVD risk reduction; Discussed strategies to manage statin-induced myalgias; Reviewed importance of limiting foods high in cholesterol; Reviewed exercise goals and target of 150 minutes per week;  Hypertension: (Status: Goal on Track (progressing): YES.) Last practice recorded BP  readings:  BP Readings from Last 3 Encounters:  05/10/21 (!) 168/72  11/05/20 (!) 169/77  07/16/20 140/88  Most recent eGFR/CrCl:  Lab Results  Component Value Date   EGFR 84 05/10/2021    No components found for: CRCL  Evaluation of current treatment plan related to hypertension self management and patient's adherence to plan as established by provider.  05-14-2021: The patient is doing well. Saw pcp recently. The patient has usual elevated blood pressure readings in the office. The patient states that he is still very active and does his push-ups and walking daily. The patient states he does not sleep a lot at night but he goes to bed early and sometimes gets up and sleeps in the recliner the rest of the night, sometimes takes naps during the day. Is looking forward to being with his family for Thanksgiving. States they will come and get him and take him to their house and then bring him back home;   Provided education to patient re: stroke prevention, s/s of heart attack and stroke; Reviewed prescribed diet heart healthy Reviewed medications with patient and discussed importance of compliance;  Discussed plans with patient for ongoing care management follow up and provided patient with direct contact information for care management team; Advised patient, providing education and rationale, to monitor blood pressure daily and record, calling PCP for findings outside established parameters;  Advised patient to discuss blood pressure trends with provider; Provided education on prescribed diet heart healthy;  Discussed complications of poorly controlled blood pressure such as heart disease, stroke, circulatory complications, vision complications, kidney impairment, sexual dysfunction;  Evaluation of white coat syndrome, the patient has known history of white coat syndrome. Denies any issues with blood pressures at home.   Patient Goals/Self-Care Activities: Take medications as prescribed    Attend all scheduled provider appointments Call pharmacy for medication refills 3-7 days in advance of running out of medications Attend church or other social activities Perform all self care activities independently  Perform IADL's (shopping, preparing meals, housekeeping, managing finances) independently Call provider office for new concerns or questions  Work with the social worker to address care coordination needs and will continue to work with the clinical team to address health care and disease management related needs call 911 call the Suicide and Crisis Lifeline: 988 call the [place] National Suicide Prevention Lifeline: (831)149-4234 call 1-800-273-TALK (toll free, 24 hour hotline) if experiencing a Mental Health or [place]  check blood pressure weekly choose a place to take my blood pressure (home, clinic or office, retail store) write blood pressure results in a log or diary learn about high blood pressure keep a blood pressure log take blood pressure log to all doctor appointments call doctor for signs and symptoms of high blood pressure develop an action plan for high blood  pressure keep all doctor appointments take medications for blood pressure exactly as prescribed report new symptoms to your doctor eat more whole grains, fruits and vegetables, lean meats and healthy fats - call for medicine refill 2 or 3 days before it runs out - take all medications exactly as prescribed - call doctor with any symptoms you believe are related to your medicine - call doctor when you experience any new symptoms - go to all doctor appointments as scheduled - adhere to prescribed diet: heart healthy diet        Plan:Telephone follow up appointment with care management team member scheduled for:  07-09-2021 at 9 am  [place], MSN, [facility] Mobile: [phone]

## 2021-05-22 DIAGNOSIS — I2510 Atherosclerotic heart disease of native coronary artery without angina pectoris: Secondary | ICD-10-CM | POA: Diagnosis not present

## 2021-05-22 DIAGNOSIS — E782 Mixed hyperlipidemia: Secondary | ICD-10-CM

## 2021-05-22 DIAGNOSIS — I10 Essential (primary) hypertension: Secondary | ICD-10-CM

## 2021-07-09 ENCOUNTER — Telehealth: Payer: Self-pay

## 2021-07-09 ENCOUNTER — Telehealth: Payer: Medicare Other

## 2021-07-09 NOTE — Telephone Encounter (Signed)
°  Care Management   Follow Up Note   07/09/2021 Name: Nicholas Gross MRN: [id number] DOB: September 01, 1927   Referred by: Dorcas Carrow, DO Reason for referral : Chronic Care Management (RNCM: Follow up for Chronic Disease Management and Care Coordination Needs )   An unsuccessful telephone outreach was attempted today. The patient was referred to the case management team for assistance with care management and care coordination.   Follow Up Plan: A HIPPA compliant phone message was left for the patient providing contact information and requesting a return call.   [name] RN, MSN, CCM Community Care Coordinator [place]   [facility] Mobile: [phone]

## 2021-07-10 ENCOUNTER — Telehealth: Payer: Self-pay

## 2021-07-10 ENCOUNTER — Ambulatory Visit (INDEPENDENT_AMBULATORY_CARE_PROVIDER_SITE_OTHER): Payer: Medicare Other

## 2021-07-10 ENCOUNTER — Telehealth: Payer: Medicare Other

## 2021-07-10 DIAGNOSIS — E782 Mixed hyperlipidemia: Secondary | ICD-10-CM

## 2021-07-10 DIAGNOSIS — I2510 Atherosclerotic heart disease of native coronary artery without angina pectoris: Secondary | ICD-10-CM

## 2021-07-10 DIAGNOSIS — Z9181 History of falling: Secondary | ICD-10-CM

## 2021-07-10 DIAGNOSIS — I10 Essential (primary) hypertension: Secondary | ICD-10-CM

## 2021-07-10 NOTE — Telephone Encounter (Signed)
°  Care Management   Follow Up Note   07/10/2021 Name: Nicholas Gross MRN: [id number] DOB: 08-08-1927   Referred by: Dorcas Carrow, DO Reason for referral : Chronic Care Management (RNCM: Follow up for Chronic Disease Management and Care Coordination Needs)   RNCM made a call back to the patient and completed the call. See new encounter.   Follow Up Plan: Telephone follow up appointment with care management team member scheduled for: 08-28-2021 at 0900 am  [name] RN, MSN, CCM Community Care Coordinator [place]   [facility] Mobile: [phone]

## 2021-07-10 NOTE — Chronic Care Management (AMB) (Signed)
Chronic Care Management   CCM RN Visit Note  07/10/2021 Name: Nicholas Gross MRN: [id number] DOB: 05-25-1928  Subjective: Nicholas Gross is a 88 y.o. year old male who is a primary care patient of [name], DO. The care management team was consulted for assistance with disease management and care coordination needs.    Engaged with patient by telephone for follow up visit in response to provider referral for case management and/or care coordination services.   Consent to Services:  The patient was given information about Chronic Care Management services, agreed to services, and gave verbal consent prior to initiation of services.  Please see initial visit note for detailed documentation.   Patient agreed to services and verbal consent obtained.   Assessment: Review of patient past medical history, allergies, medications, health status, including review of consultants reports, laboratory and other test data, was performed as part of comprehensive evaluation and provision of chronic care management services.   SDOH (Social Determinants of Health) assessments and interventions performed:    CCM Care Plan  Allergies  Allergen Reactions   Other     Patient states that he is allergic to something that they place in the IV before they work on you   Simvastatin Other (See Comments)    Myalgia     Outpatient Encounter Medications as of 07/10/2021  Medication Sig   aspirin EC 81 MG tablet Take 1 tablet (81 mg total) by mouth daily.   lisinopril (ZESTRIL) 5 MG tablet Take 1 tablet (5 mg total) by mouth daily.   Multiple Vitamins-Minerals (PRESERVISION AREDS PO) Take by mouth 2 (two) times daily.   rosuvastatin (CRESTOR) 10 MG tablet Take 1 tablet (10 mg total) by mouth daily.   No facility-administered encounter medications on file as of 07/10/2021.    Patient Active Problem List   Diagnosis Date Noted   PAD (peripheral artery disease) ([place]) 07/15/2020   Lymphedema  05/06/2019   Hyperlipemia 02/13/2016   Macular degeneration 08/15/2015   Carotid atherosclerosis 08/15/2015   Allergic rhinitis due to pollen 02/12/2015   Hypertension    CAD (coronary artery disease)    H/O cardiac catheterization 06/12/2014   History of left-sided carotid endarterectomy 06/12/2014    Conditions to be addressed/monitored:CAD, HTN, HLD, and Falls  Care Plan : RNCM: General Plan of Care (Adult) for Chronic Disease Management and Care Coordination Needs  Updates made by Vanita Ingles, RN since 07/10/2021 12:00 AM     Problem: RNCM: Development of Plan of Care for Chronic Disease Management (HTN, HLD, CAD, Falls)   Priority: High     Long-Range Goal: RNCM: Effective Management  of Plan of Care for Chronic Disease Management (HTN, HLD, CAD, Falls)   Start Date: 05/14/2021  Expected End Date: 05/14/2022  Priority: High  Note:   Current Barriers:  Knowledge Deficits related to plan of care for management of CAD, HTN, and HLD, Falls  Chronic Disease Management support and education needs related to CAD, HTN, and HLD, Falls  RNCM Clinical Goal(s):  Patient will verbalize basic understanding of CAD, HTN, and HLD, Falls,  disease process and self health management plan as evidenced by keeping appointments, following plan of care, and working with the CCM team to effectively manage health and well being.  take all medications exactly as prescribed and will call provider for medication related questions as evidenced by taking medications as prescribed and calling for refills before running out of medications     attend  all scheduled medical appointments: 11-08-2021 at 10 am as evidenced by keeping appointments and calling the office for any schedule change needs         demonstrate improved and ongoing adherence to prescribed treatment plan for CAD, HTN, and HLD, falls, as evidenced by stable conditions, no acute changes in health and well being, and working with the CCM team  for new needs or concerns demonstrate ongoing self health care management ability for effective management of chronic conditions as evidenced by working with the CCM team through collaboration with Consulting civil engineer, provider, and care team.   Interventions: 1:1 collaboration with primary care provider regarding development and update of comprehensive plan of care as evidenced by provider attestation and co-signature Inter-disciplinary care team collaboration (see longitudinal plan of care) Evaluation of current treatment plan related to  self management and patient's adherence to plan as established by provider   CAD  (Status: Goal on Track (progressing): YES.) Long Term Goal  Assessed understanding of CAD diagnosis Medications reviewed including medications utilized in CAD treatment plan Provided education on importance of blood pressure control in management of CAD; Provided education on Importance of limiting foods high in cholesterol. 07-10-2021: The patient is doing well and eating good.  Counseled on importance of regular laboratory monitoring as prescribed; Counseled on the importance of exercise goals with target of 150 minutes per week Reviewed Importance of taking all medications as prescribed Reviewed Importance of attending all scheduled provider appointments. Next appointment in May of 2023 Advised to report any changes in symptoms or exercise tolerance Screening for signs and symptoms of depression related to chronic disease state;  Assessed social determinant of health barriers;   Hyperlipidemia:  (Status: Goal on Track (progressing): YES.) Long Term Goal  Lab Results  Component Value Date   CHOL 116 05/10/2021   HDL 54 05/10/2021   LDLCALC 46 05/10/2021   TRIG 79 05/10/2021     Medication review performed; medication list updated in electronic medical record.  Provider established cholesterol goals reviewed. 07-10-2021: The patient within goals. Praised for good results.   Counseled on importance of regular laboratory monitoring as prescribed. 07-10-2021: The patient gets regular lab testing  Provided HLD educational materials; Reviewed role and benefits of statin for ASCVD risk reduction; Discussed strategies to manage statin-induced myalgias; Reviewed importance of limiting foods high in cholesterol; Reviewed exercise goals and target of 150 minutes per week;  Hypertension: (Status: Goal on Track (progressing): YES.) Last practice recorded BP readings:  BP Readings from Last 3 Encounters:  05/10/21 (!) 168/72  11/05/20 (!) 169/77  07/16/20 140/88  Known white coat syndrome Most recent eGFR/CrCl:  Lab Results  Component Value Date   EGFR 84 05/10/2021    No components found for: CRCL  Evaluation of current treatment plan related to hypertension self management and patient's adherence to plan as established by provider.  05-14-2021: The patient is doing well. Saw pcp recently. The patient has usual elevated blood pressure readings in the office. The patient states that he is still very active and does his push-ups and walking daily. The patient states he does not sleep a lot at night but he goes to bed early and sometimes gets up and sleeps in the recliner the rest of the night, sometimes takes naps during the day. Is looking forward to being with his family for Thanksgiving. States they will come and get him and take him to their house and then bring him back home;  07-10-2021: The patient  states he is doing well. He is celebrating his 89nd birthday today. He denies any issues with his blood pressures. Did loose his balance day before yesterday and had a fall. Denies dizziness, light headedness or headaches. Will continue to monitor for changes.  Provided education to patient re: stroke prevention, s/s of heart attack and stroke; Reviewed prescribed diet heart healthy Reviewed medications with patient and discussed importance of compliance. 07-10-2021: The  patient states that he is compliant with medications;  Discussed plans with patient for ongoing care management follow up and provided patient with direct contact information for care management team; Advised patient, providing education and rationale, to monitor blood pressure daily and record, calling PCP for findings outside established parameters;  Advised patient to discuss blood pressure trends with provider; Provided education on prescribed diet heart healthy;  Discussed complications of poorly controlled blood pressure such as heart disease, stroke, circulatory complications, vision complications, kidney impairment, sexual dysfunction;  Evaluation of white coat syndrome, the patient has known history of white coat syndrome. Denies any issues with blood pressures at home.    Falls:  (Status: Goal on Track (progressing): YES.) Long Term Goal  Provided written and verbal education re: potential causes of falls and Fall prevention strategies Reviewed medications and discussed potential side effects of medications such as dizziness and frequent urination Advised patient of importance of notifying provider of falls. 07-10-2021: The patient had a fall day before yesterday and landed on his right hip. The patient states that it was a little sore but not bad. He denies hitting his head. Education on falls prevention and safety. States he lost his balance and fell.  Assessed for signs and symptoms of orthostatic hypotension Assessed for falls since last encounter. 07-10-2021: Last fall on 07-08-2021. States he was "feeding Lily" and lost his balance. He has a bruise on his right hip. States it is sore but nothing he can't handle. Education and support given.  Assessed patients knowledge of fall risk prevention secondary to previously provided education Provided patient information for fall alert systems Assessed working status of life alert bracelet and patient adherence Advised patient to discuss falls  and safety  with provider Screening for signs and symptoms of depression related to chronic disease state Assessed social determinant of health barriers   Patient Goals/Self-Care Activities: Take medications as prescribed   Attend all scheduled provider appointments Call pharmacy for medication refills 3-7 days in advance of running out of medications Attend church or other social activities Perform all self care activities independently  Perform IADL's (shopping, preparing meals, housekeeping, managing finances) independently Call provider office for new concerns or questions  Work with the social worker to address care coordination needs and will continue to work with the clinical team to address health care and disease management related needs call 911 call the Suicide and Crisis Lifeline: 988 call the [place] National Suicide Prevention Lifeline: 417-188-4728 call 1-800-273-TALK (toll free, 24 hour hotline) if experiencing a Mental Health or [place]  check blood pressure weekly choose a place to take my blood pressure (home, clinic or office, retail store) write blood pressure results in a log or diary learn about high blood pressure keep a blood pressure log take blood pressure log to all doctor appointments call doctor for signs and symptoms of high blood pressure develop an action plan for high blood pressure keep all doctor appointments take medications for blood pressure exactly as prescribed report new symptoms to your doctor eat more whole grains,  fruits and vegetables, lean meats and healthy fats - call for medicine refill 2 or 3 days before it runs out - take all medications exactly as prescribed - call doctor with any symptoms you believe are related to your medicine - call doctor when you experience any new symptoms - go to all doctor appointments as scheduled - adhere to prescribed diet: heart healthy diet        Plan:Telephone follow up  appointment with care management team member scheduled for:  08-28-2021 at 0900 am  [place], MSN, [facility] Mobile: [phone]

## 2021-07-10 NOTE — Patient Instructions (Signed)
Visit Information  Thank you for taking time to visit with me today. Please don't hesitate to contact me if I can be of assistance to you before our next scheduled telephone appointment.  Following are the goals we discussed today:  RNCM Clinical Goal(s):  Patient will verbalize basic understanding of CAD, HTN, and HLD, Falls,  disease process and self health management plan as evidenced by keeping appointments, following plan of care, and working with the CCM team to effectively manage health and well being.  take all medications exactly as prescribed and will call provider for medication related questions as evidenced by taking medications as prescribed and calling for refills before running out of medications     attend all scheduled medical appointments: 11-08-2021 at 10 am as evidenced by keeping appointments and calling the office for any schedule change needs         demonstrate improved and ongoing adherence to prescribed treatment plan for CAD, HTN, and HLD, falls, as evidenced by stable conditions, no acute changes in health and well being, and working with the CCM team for new needs or concerns demonstrate ongoing self health care management ability for effective management of chronic conditions as evidenced by working with the CCM team through collaboration with Consulting civil engineer, provider, and care team.    Interventions: 1:1 collaboration with primary care provider regarding development and update of comprehensive plan of care as evidenced by provider attestation and co-signature Inter-disciplinary care team collaboration (see longitudinal plan of care) Evaluation of current treatment plan related to  self management and patient's adherence to plan as established by provider     CAD  (Status: Goal on Track (progressing): YES.) Long Term Goal  Assessed understanding of CAD diagnosis Medications reviewed including medications utilized in CAD treatment plan Provided education on  importance of blood pressure control in management of CAD; Provided education on Importance of limiting foods high in cholesterol. 07-10-2021: The patient is doing well and eating good.  Counseled on importance of regular laboratory monitoring as prescribed; Counseled on the importance of exercise goals with target of 150 minutes per week Reviewed Importance of taking all medications as prescribed Reviewed Importance of attending all scheduled provider appointments. Next appointment in May of 2023 Advised to report any changes in symptoms or exercise tolerance Screening for signs and symptoms of depression related to chronic disease state;  Assessed social determinant of health barriers;    Hyperlipidemia:  (Status: Goal on Track (progressing): YES.) Long Term Goal       Lab Results  Component Value Date    CHOL 116 05/10/2021    HDL 54 05/10/2021    LDLCALC 46 05/10/2021    TRIG 79 05/10/2021      Medication review performed; medication list updated in electronic medical record.  Provider established cholesterol goals reviewed. 07-10-2021: The patient within goals. Praised for good results.  Counseled on importance of regular laboratory monitoring as prescribed. 07-10-2021: The patient gets regular lab testing  Provided HLD educational materials; Reviewed role and benefits of statin for ASCVD risk reduction; Discussed strategies to manage statin-induced myalgias; Reviewed importance of limiting foods high in cholesterol; Reviewed exercise goals and target of 150 minutes per week;   Hypertension: (Status: Goal on Track (progressing): YES.) Last practice recorded BP readings:     BP Readings from Last 3 Encounters:  05/10/21 (!) 168/72  11/05/20 (!) 169/77  07/16/20 140/88  Known white coat syndrome Most recent eGFR/CrCl:       Lab  Results  Component Value Date    EGFR 84 05/10/2021    No components found for: CRCL   Evaluation of current treatment plan related to hypertension  self management and patient's adherence to plan as established by provider.  05-14-2021: The patient is doing well. Saw pcp recently. The patient has usual elevated blood pressure readings in the office. The patient states that he is still very active and does his push-ups and walking daily. The patient states he does not sleep a lot at night but he goes to bed early and sometimes gets up and sleeps in the recliner the rest of the night, sometimes takes naps during the day. Is looking forward to being with his family for Thanksgiving. States they will come and get him and take him to their house and then bring him back home;  07-10-2021: The patient states he is doing well. He is celebrating his 89nd birthday today. He denies any issues with his blood pressures. Did loose his balance day before yesterday and had a fall. Denies dizziness, light headedness or headaches. Will continue to monitor for changes.  Provided education to patient re: stroke prevention, s/s of heart attack and stroke; Reviewed prescribed diet heart healthy Reviewed medications with patient and discussed importance of compliance. 07-10-2021: The patient states that he is compliant with medications;  Discussed plans with patient for ongoing care management follow up and provided patient with direct contact information for care management team; Advised patient, providing education and rationale, to monitor blood pressure daily and record, calling PCP for findings outside established parameters;  Advised patient to discuss blood pressure trends with provider; Provided education on prescribed diet heart healthy;  Discussed complications of poorly controlled blood pressure such as heart disease, stroke, circulatory complications, vision complications, kidney impairment, sexual dysfunction;  Evaluation of white coat syndrome, the patient has known history of white coat syndrome. Denies any issues with blood pressures at home.      Falls:   (Status: Goal on Track (progressing): YES.) Long Term Goal  Provided written and verbal education re: potential causes of falls and Fall prevention strategies Reviewed medications and discussed potential side effects of medications such as dizziness and frequent urination Advised patient of importance of notifying provider of falls. 07-10-2021: The patient had a fall day before yesterday and landed on his right hip. The patient states that it was a little sore but not bad. He denies hitting his head. Education on falls prevention and safety. States he lost his balance and fell.  Assessed for signs and symptoms of orthostatic hypotension Assessed for falls since last encounter. 07-10-2021: Last fall on 07-08-2021. States he was "feeding Lily" and lost his balance. He has a bruise on his right hip. States it is sore but nothing he can't handle. Education and support given.  Assessed patients knowledge of fall risk prevention secondary to previously provided education Provided patient information for fall alert systems Assessed working status of life alert bracelet and patient adherence Advised patient to discuss falls and safety  with provider Screening for signs and symptoms of depression related to chronic disease state Assessed social determinant of health barriers    Patient Goals/Self-Care Activities: Take medications as prescribed   Attend all scheduled provider appointments Call pharmacy for medication refills 3-7 days in advance of running out of medications Attend church or other social activities Perform all self care activities independently  Perform IADL's (shopping, preparing meals, housekeeping, managing finances) independently Call provider office for new concerns  or questions  Work with the Education officer, museum to address care coordination needs and will continue to work with the clinical team to address health care and disease management related needs call 911 call the Suicide and Crisis  Lifeline: 988 call the [place] National Suicide Prevention Lifeline: (212)319-6629 call 1-800-273-TALK (toll free, 24 hour hotline) if experiencing a Mental Health or [place]  check blood pressure weekly choose a place to take my blood pressure (home, clinic or office, retail store) write blood pressure results in a log or diary learn about high blood pressure keep a blood pressure log take blood pressure log to all doctor appointments call doctor for signs and symptoms of high blood pressure develop an action plan for high blood pressure keep all doctor appointments take medications for blood pressure exactly as prescribed report new symptoms to your doctor eat more whole grains, fruits and vegetables, lean meats and healthy fats - call for medicine refill 2 or 3 days before it runs out - take all medications exactly as prescribed - call doctor with any symptoms you believe are related to your medicine - call doctor when you experience any new symptoms - go to all doctor appointments as scheduled - adhere to prescribed diet: heart healthy diet       Our next appointment is by telephone on 08-28-2021  at 0900 am  Please call the care guide team at [phone] if you need to cancel or reschedule your appointment.   If you are experiencing a Mental Health or [place] or need someone to talk to, please call the Suicide and Crisis Lifeline: 988 call the [place] National Suicide Prevention Lifeline: 708 704 4847 or TTY: 743-205-0705 TTY 321-723-7912) to talk to a trained counselor call 1-800-273-TALK (toll free, 24 hour hotline)   The patient verbalized understanding of instructions, educational materials, and care plan provided today and declined offer to receive copy of patient instructions, educational materials, and care plan.   Noreene Larsson RN, MSN, [facility] Mobile:  [phone]

## 2021-07-23 DIAGNOSIS — E782 Mixed hyperlipidemia: Secondary | ICD-10-CM

## 2021-07-23 DIAGNOSIS — I10 Essential (primary) hypertension: Secondary | ICD-10-CM

## 2021-07-23 DIAGNOSIS — I2510 Atherosclerotic heart disease of native coronary artery without angina pectoris: Secondary | ICD-10-CM | POA: Diagnosis not present

## 2021-08-05 NOTE — Progress Notes (Signed)
Cardiology Office Note  Date:  08/06/2021   ID:  Nicholas Gross, DOB 12/25/1927, MRN [id number]  PCP:  Dorcas Carrow, DO   Chief Complaint  Patient presents with   12 month follow up     Patient c/o falling more frequently and balance is off when bending over to feed the dog. Medications reviewed by the patient verbally.     HPI:  Nicholas Gross is a 88 y.o.male patient with PMH of  CAD Cypher stent mid LAD 09/12/2002, "95 percent blocked" Essential hypertension Hyperlipidemia  Left carotid endarterectomy 07/10/2008 Former smoker 1950 - 11[birth year]69 Who presents for f/u of his  CAD, PAD  Previously followed at [facility] in 2018 LOV 06/2020 On today's visit reports he feels well Lives by himself in a small mobile home Neighbors check him  Does not check his blood pressure at home Markedly elevated today Very reluctant to change his medication regiment, happy to stay on lisinopril 5 daily Systolic pressure 200 on my check Reports it is not a problem  EKG personally reviewed by myself on todays visit Normal sinus rhythm rate 62 bpm PVCs no significant ST-T wave changes  Lab Results  Component Value Date   CHOL 116 05/10/2021   HDL 54 05/10/2021   LDLCALC 46 05/10/2021   TRIG 79 05/10/2021     PMH:   has a past medical history of CAD (coronary artery disease), History of GI bleed (2008), History of tobacco use, Hyperlipidemia, Hypertension, and Overweight.  PSH:    Past Surgical History:  Procedure Laterality Date   CAROTID ENDARTERECTOMY Left 2010   CATARACT EXTRACTION     cypher stent  09/12/02   s/p cypher stent mid-LAD   TENDON REPAIR  1991   finger and arm    Current Outpatient Medications  Medication Sig Dispense Refill   aspirin EC 81 MG tablet Take 1 tablet (81 mg total) by mouth daily. 100 tablet 12   lisinopril (ZESTRIL) 5 MG tablet Take 1 tablet (5 mg total) by mouth daily. 90 tablet 1   Multiple Vitamins-Minerals (PRESERVISION AREDS PO) Take by mouth 2  (two) times daily.     rosuvastatin (CRESTOR) 10 MG tablet Take 1 tablet (10 mg total) by mouth daily. 90 tablet 1   No current facility-administered medications for this visit.     Allergies:   Other and Simvastatin   Social History:  The patient  reports that he quit smoking about 53 years ago. His smoking use included cigarettes. He has a 17.00 pack-year smoking history. He has never used smokeless tobacco. He reports that he does not drink alcohol and does not use drugs.   Family History:   family history includes AAA (abdominal aortic aneurysm) in his brother; Cancer in his sister and sister; Heart disease in his father.    Review of Systems: Review of Systems  Constitutional: Negative.   HENT: Negative.    Respiratory: Negative.    Cardiovascular: Negative.   Gastrointestinal: Negative.   Musculoskeletal: Negative.   Neurological: Negative.   Psychiatric/Behavioral: Negative.    All other systems reviewed and are negative.   PHYSICAL EXAM: VS:  BP (!) 178/80 (BP Location: Left Arm, Patient Position: Sitting, Cuff Size: Normal)    Pulse 62    Ht 5\' 9"  (1.753 m)    Wt 166 lb 4 oz (75.4 kg)    BMI 24.55 kg/m  , BMI Body mass index is 24.55 kg/m. Constitutional:  oriented to person, place, and time. No  distress.  HENT:  Head: Grossly normal Eyes:  no discharge. No scleral icterus.  Neck: No JVD, no carotid bruits  Cardiovascular: Regular rate and rhythm, 3/6 murmurs appreciated Trace lower extremity edema Pulmonary/Chest: Clear to auscultation bilaterally, no wheezes or rales Abdominal: Soft.  no distension.  no tenderness.  Musculoskeletal: Normal range of motion Neurological:  normal muscle tone. Coordination normal. No atrophy Skin: Skin warm and dry Psychiatric: normal affect, pleasant   Recent Labs: 11/05/2020: Hemoglobin 13.7; Platelets 187; TSH 2.140 05/10/2021: ALT 21; BUN 9; Creatinine, Ser 0.78; Potassium 4.1; Sodium 137    Lipid Panel Lab Results   Component Value Date   CHOL 116 05/10/2021   HDL 54 05/10/2021   LDLCALC 46 05/10/2021   TRIG 79 05/10/2021      Wt Readings from Last 3 Encounters:  08/06/21 166 lb 4 oz (75.4 kg)  05/10/21 166 lb (75.3 kg)  11/05/20 173 lb (78.5 kg)     ASSESSMENT AND PLAN:  Problem List Items Addressed This Visit       Cardiology Problems   Hypertension   Hyperlipemia   PAD (peripheral artery disease) (HCC) - Primary   Carotid atherosclerosis   CAD (coronary artery disease)     Other   Lymphedema  Coronary disease with stable angina Currently with no symptoms of angina. No further workup at this time. Continue current medication regimen. Declining any testing  Essential hypertension Poorly controlled on today's visit, He does not feel it is a problem, very reluctant to check pressures at home  our nurse lives near him and has volunteered to check his blood pressure at home and report back with numbers He is very reluctant to even change his medications in general let alone today We did discuss the importance of closely monitoring his blood pressure and the effect on cardiac health and stroke  PAD/carotid stenosis Prior carotid endarterectomy on the left Cholesterol at goal, non-smoker, no diabetes Previously declined ultrasound, he is willing to have repeat performed  Murmur Prominent murmur on exam, echocardiogram has been ordered  Leg swelling Likely component of dependent edema Recommend compression hose Echo ordered as above  Hyperlipidemia Numbers at goal Recommend he stay on the Crestor    Total encounter time more than 40 minutes  Greater than 50% was spent in counseling and coordination of care with the patient    Signed, Dossie Arbour, M.D., Ph.D. [facility] [place], [place] [phone]

## 2021-08-06 ENCOUNTER — Other Ambulatory Visit: Payer: Self-pay

## 2021-08-06 ENCOUNTER — Ambulatory Visit: Payer: Medicare Other | Admitting: Cardiovascular Disease

## 2021-08-06 ENCOUNTER — Encounter: Payer: Self-pay | Admitting: Cardiovascular Disease

## 2021-08-06 VITALS — BP 178/80 | HR 62 | Ht 69.0 in | Wt 166.2 lb

## 2021-08-06 DIAGNOSIS — I6529 Occlusion and stenosis of unspecified carotid artery: Secondary | ICD-10-CM | POA: Diagnosis not present

## 2021-08-06 DIAGNOSIS — E782 Mixed hyperlipidemia: Secondary | ICD-10-CM | POA: Diagnosis not present

## 2021-08-06 DIAGNOSIS — I25118 Atherosclerotic heart disease of native coronary artery with other forms of angina pectoris: Secondary | ICD-10-CM

## 2021-08-06 DIAGNOSIS — I739 Peripheral vascular disease, unspecified: Secondary | ICD-10-CM | POA: Diagnosis not present

## 2021-08-06 DIAGNOSIS — R011 Cardiac murmur, unspecified: Secondary | ICD-10-CM

## 2021-08-06 DIAGNOSIS — I10 Essential (primary) hypertension: Secondary | ICD-10-CM | POA: Diagnosis not present

## 2021-08-06 DIAGNOSIS — I890 Lymphedema, not elsewhere classified: Secondary | ICD-10-CM

## 2021-08-06 MED ORDER — LISINOPRIL 5 MG PO TABS: 5.0000 mg | ORAL_TABLET | Freq: Two times a day (BID) | ORAL | 3 refills | Status: DC

## 2021-08-06 NOTE — Patient Instructions (Addendum)
Please monitor blood pressure daily Call with numbers  Medication Instructions:  For pressure >150, take two of the lisinopril  If you need a refill on your cardiac medications before your next appointment, please call your pharmacy.   Lab work: No new labs needed  Testing/Procedures: Echocardiogram Caroid ultrasound   Follow-Up: At [facility], you and your health needs are our priority.  As part of our continuing mission to provide you with exceptional heart care, we have created designated Provider Care Teams.  These Care Teams include your primary Cardiologist (physician) and Advanced Practice Providers (APPs -  Physician Assistants and Nurse Practitioners) who all work together to provide you with the care you need, when you need it.  You will need a follow up appointment in 3 months with APP  Providers on your designated Care Team:   Nicholas Ducking, NP Nicholas Listen, PA-C Nicholas Gross, [place]  COVID-19 Vaccine Information can be found at: [email or web address] For questions related to vaccine distribution or appointments, please email [email or web address] or call [phone].

## 2021-08-07 ENCOUNTER — Other Ambulatory Visit: Payer: Self-pay | Admitting: Cardiovascular Disease

## 2021-08-07 DIAGNOSIS — R011 Cardiac murmur, unspecified: Secondary | ICD-10-CM

## 2021-08-08 ENCOUNTER — Other Ambulatory Visit: Payer: Self-pay

## 2021-08-08 ENCOUNTER — Ambulatory Visit (INDEPENDENT_AMBULATORY_CARE_PROVIDER_SITE_OTHER): Payer: Medicare Other

## 2021-08-08 DIAGNOSIS — R011 Cardiac murmur, unspecified: Secondary | ICD-10-CM

## 2021-08-08 DIAGNOSIS — I6523 Occlusion and stenosis of bilateral carotid arteries: Secondary | ICD-10-CM

## 2021-08-08 DIAGNOSIS — I6529 Occlusion and stenosis of unspecified carotid artery: Secondary | ICD-10-CM

## 2021-08-08 DIAGNOSIS — R0989 Other specified symptoms and signs involving the circulatory and respiratory systems: Secondary | ICD-10-CM | POA: Diagnosis not present

## 2021-08-08 DIAGNOSIS — I10 Essential (primary) hypertension: Secondary | ICD-10-CM

## 2021-08-08 LAB — ECHOCARDIOGRAM COMPLETE
AR max vel: 1.45 cm2
AV Area VTI: 1.27 cm2
AV Area mean vel: 1.31 cm2
AV Mean grad: 10.2 mmHg
AV Peak grad: 17 mmHg
Ao pk vel: 2.06 m/s
Area-P 1/2: 2.14 cm2
Calc EF: 53 %
MV VTI: 1.53 cm2
Single Plane A2C EF: 52 %
Single Plane A4C EF: 52.6 %

## 2021-08-12 ENCOUNTER — Telehealth: Payer: Self-pay

## 2021-08-12 NOTE — Telephone Encounter (Signed)
Able to reach pt regarding his recent ECHO & Carotid u/s Dr. [name] had a chance to review his results and advised   "Echocardiogram  Low normal ejection fraction  RV function normal  Mild to moderately dilated left atrium  Severely calcified aortic valve with mild stenosis, moderate by estimated valve area  Mildly dilated aorta  Everything above can be monitored with periodic echocardiogram   Carotid ultrasound  No significant plaque bilaterally, less than 39% disease "  Nicholas Gross very thankful for the phone call of his results, all questions and concerns were address with nothing further at this time. Will see at next schedule f/u appt.

## 2021-08-20 NOTE — Addendum Note (Signed)
Addended by: Anselm Pancoast on: 08/20/2021 12:14 PM   Modules accepted: Orders

## 2021-08-28 ENCOUNTER — Telehealth: Payer: Self-pay

## 2021-08-28 ENCOUNTER — Ambulatory Visit (INDEPENDENT_AMBULATORY_CARE_PROVIDER_SITE_OTHER): Payer: Medicare Other

## 2021-08-28 ENCOUNTER — Telehealth: Payer: Medicare Other

## 2021-08-28 DIAGNOSIS — Z9181 History of falling: Secondary | ICD-10-CM

## 2021-08-28 DIAGNOSIS — I2510 Atherosclerotic heart disease of native coronary artery without angina pectoris: Secondary | ICD-10-CM

## 2021-08-28 DIAGNOSIS — E782 Mixed hyperlipidemia: Secondary | ICD-10-CM

## 2021-08-28 DIAGNOSIS — I10 Essential (primary) hypertension: Secondary | ICD-10-CM

## 2021-08-28 NOTE — Patient Instructions (Signed)
Visit Information  Thank you for taking time to visit with me today. Please don't hesitate to contact me if I can be of assistance to you before our next scheduled telephone appointment.  Following are the goals we discussed today:  RNCM Clinical Goal(s):  Patient will verbalize basic understanding of CAD, HTN, and HLD, Falls,  disease process and self health management plan as evidenced by keeping appointments, following plan of care, and working with the CCM team to effectively manage health and well being.  take all medications exactly as prescribed and will call provider for medication related questions as evidenced by taking medications as prescribed and calling for refills before running out of medications     attend all scheduled medical appointments: 11-08-2021 at 10 am as evidenced by keeping appointments and calling the office for any schedule change needs         demonstrate improved and ongoing adherence to prescribed treatment plan for CAD, HTN, and HLD, falls, as evidenced by stable conditions, no acute changes in health and well being, and working with the CCM team for new needs or concerns demonstrate ongoing self health care management ability for effective management of chronic conditions as evidenced by working with the CCM team through collaboration with Consulting civil engineer, provider, and care team.    Interventions: 1:1 collaboration with primary care provider regarding development and update of comprehensive plan of care as evidenced by provider attestation and co-signature Inter-disciplinary care team collaboration (see longitudinal plan of care) Evaluation of current treatment plan related to  self management and patient's adherence to plan as established by provider     CAD  (Status: Goal on Track (progressing): YES.) Long Term Goal     BP Readings from Last 3 Encounters:  08/06/21 (!) 178/80  05/10/21 (!) 168/72  11/05/20 (!) 169/77         Lab Results  Component Value  Date    CHOL 116 05/10/2021    HDL 54 05/10/2021    [place] 46 05/10/2021    TRIG 79 05/10/2021    Assessed understanding of CAD diagnosis. 08-28-2021: The patient has a good understanding of his CAD and heart health. The patient saw the cardiologist in February but did not want to change his medications. The patient states his blood pressures are always elevated when he goes to provider visits. Education and support given.  Medications reviewed including medications utilized in CAD treatment plan. 08-28-2021: The patient is compliant with medications. Denies any issues.  Provided education on importance of blood pressure control in management of CAD; Provided education on Importance of limiting foods high in cholesterol. 08-28-2021: The patient is doing well and eating good.  Counseled on importance of regular laboratory monitoring as prescribed; Counseled on the importance of exercise goals with target of 150 minutes per week. 08-28-2021: The patient usually does 40 to 45 pushups each morning. He did 50 today and feels like he could do more. The patient had not walked today but will. He usually gets up between 330 and 4 am daily. He ate breakfast at 430 am. This is his daily routine. Denies any acute findings.  Reviewed Importance of taking all medications as prescribed. 08-28-2021: Is compliant with medications. Denies any issues with medication compliance.  Reviewed Importance of attending all scheduled provider appointments. Next appointment in May of 2023, reminder given today Advised to report any changes in symptoms or exercise tolerance. 08-28-2021: No issues with activity or his routine for exercising Screening for signs  and symptoms of depression related to chronic disease state;  Assessed social determinant of health barriers;    Hyperlipidemia:  (Status: Goal on Track (progressing): YES.) Long Term Goal       Lab Results  Component Value Date    CHOL 116 05/10/2021    HDL 54 05/10/2021     LDLCALC 46 05/10/2021    TRIG 79 05/10/2021      Medication review performed; medication list updated in electronic medical record. 08-28-2021: The patient takes Crestor 10 mg daily  Provider established cholesterol goals reviewed. 07-10-2021: The patient within goals. Praised for good results.  Counseled on importance of regular laboratory monitoring as prescribed. 08-28-2021: The patient gets regular lab testing  Provided HLD educational materials; Reviewed role and benefits of statin for ASCVD risk reduction; Discussed strategies to manage statin-induced myalgias. 08-28-2021: Denies any changes in condition related to statin use; Reviewed importance of limiting foods high in cholesterol. 08-28-2021: Review and education provided; Reviewed exercise goals and target of 150 minutes per week;   Hypertension: (Status: Goal on Track (progressing): YES.) Last practice recorded BP readings:     BP Readings from Last 3 Encounters:  08/06/21 (!) 178/80  05/10/21 (!) 168/72  11/05/20 (!) 169/77  Known white coat syndrome Most recent eGFR/CrCl:       Lab Results  Component Value Date    EGFR 84 05/10/2021    No components found for: CRCL   Evaluation of current treatment plan related to hypertension self management and patient's adherence to plan as established by provider.  05-14-2021: The patient is doing well. Saw pcp recently. The patient has usual elevated blood pressure readings in the office. The patient states that he is still very active and does his push-ups and walking daily. The patient states he does not sleep a lot at night but he goes to bed early and sometimes gets up and sleeps in the recliner the rest of the night, sometimes takes naps during the day. Is looking forward to being with his family for Thanksgiving. States they will come and get him and take him to their house and then bring him back home;  07-10-2021: The patient states he is doing well. He is celebrating his 89nd birthday  today. He denies any issues with his blood pressures. Did loose his balance day before yesterday and had a fall. Denies dizziness, light headedness or headaches. Will continue to monitor for changes. 08-28-2021: The patient states his blood pressures are always high when he goes to see the doctor because he gets nervous. The patient does not check at home but states he feels fine. Denied wanting to increase or change his blood pressure medications at the cardiologist in February. Will continue to monitor for changes or needs.  Provided education to patient re: stroke prevention, s/s of heart attack and stroke; Reviewed prescribed diet heart healthy Reviewed medications with patient and discussed importance of compliance. 08-28-2021: The patient states that he is compliant with medications;  Discussed plans with patient for ongoing care management follow up and provided patient with direct contact information for care management team; Advised patient, providing education and rationale, to monitor blood pressure daily and record, calling PCP for findings outside established parameters;  Advised patient to discuss blood pressure trends with provider; Provided education on prescribed diet heart healthy;  Discussed complications of poorly controlled blood pressure such as heart disease, stroke, circulatory complications, vision complications, kidney impairment, sexual dysfunction;  Evaluation of white coat syndrome, the patient  has known history of white coat syndrome. Denies any issues with blood pressures at home.      Falls:  (Status: Goal on Track (progressing): YES.) Long Term Goal  Provided written and verbal education re: potential causes of falls and Fall prevention strategies Reviewed medications and discussed potential side effects of medications such as dizziness and frequent urination Advised patient of importance of notifying provider of falls. 07-10-2021: The patient had a fall day before  yesterday and landed on his right hip. The patient states that it was a little sore but not bad. He denies hitting his head. Education on falls prevention and safety. States he lost his balance and fell.  Assessed for signs and symptoms of orthostatic hypotension Assessed for falls since last encounter. 07-10-2021: Last fall on 07-08-2021. States he was "feeding Lily" and lost his balance. He has a bruise on his right hip. States it is sore but nothing he can't handle. Education and support given. 08-28-2021: Denies any new falls. Is trying to be careful and mindful of high risk for falls. Assessed patients knowledge of fall risk prevention secondary to previously provided education Provided patient information for fall alert systems Assessed working status of life alert bracelet and patient adherence Advised patient to discuss falls and safety  with provider Screening for signs and symptoms of depression related to chronic disease state Assessed social determinant of health barriers    Patient Goals/Self-Care Activities: Take medications as prescribed   Attend all scheduled provider appointments Call pharmacy for medication refills 3-7 days in advance of running out of medications Attend church or other social activities Perform all self care activities independently  Perform IADL's (shopping, preparing meals, housekeeping, managing finances) independently Call provider office for new concerns or questions  Work with the social worker to address care coordination needs and will continue to work with the clinical team to address health care and disease management related needs call 911 call the Suicide and Crisis Lifeline: 988 call the [place] National Suicide Prevention Lifeline: 807-059-9931 call 1-800-273-TALK (toll free, 24 hour hotline) if experiencing a [place] or [place]  check blood pressure weekly choose a place to take my blood pressure (home, clinic or office,  retail store) write blood pressure results in a log or diary learn about high blood pressure keep a blood pressure log take blood pressure log to all doctor appointments call doctor for signs and symptoms of high blood pressure develop an action plan for high blood pressure keep all doctor appointments take medications for blood pressure exactly as prescribed report new symptoms to your doctor eat more whole grains, fruits and vegetables, lean meats and healthy fats - call for medicine refill 2 or 3 days before it runs out - take all medications exactly as prescribed - call doctor with any symptoms you believe are related to your medicine - call doctor when you experience any new symptoms - go to all doctor appointments as scheduled - adhere to prescribed diet: heart healthy diet           Our next appointment is by telephone on 10-30-2021 at 0900 am  Please call the care guide team at [phone] if you need to cancel or reschedule your appointment.   If you are experiencing a Mental Health or [place] or need someone to talk to, please call the Suicide and Crisis Lifeline: 988 call the [place] National Suicide Prevention Lifeline: 534-698-7962 or TTY: (605) 129-0148 TTY (432) 009-3518) to talk to a trained  counselor call 1-800-273-TALK (toll free, 24 hour hotline)   The patient verbalized understanding of instructions, educational materials, and care plan provided today and declined offer to receive copy of patient instructions, educational materials, and care plan.   Noreene Larsson RN, MSN, [facility] Mobile: [phone]

## 2021-08-28 NOTE — Telephone Encounter (Signed)
?  Care Management  ? ?Follow Up Note ? ? ?08/28/2021 ?Name: Nicholas Gross MRN: [id number] DOB: Jun 08, 1928 ? ? ?Referred by: Dorcas Carrow, DO ?Reason for referral : Chronic Care Management (RNCM: Follow up for Chronic Disease Management and Care Coordination Needs ) ? ? ?RNCM reached back out to the patient and completed the call. See new encounter. ? ?Follow Up Plan: Telephone follow up appointment with care management team member scheduled for: 10-30-2021 at 0900 am ? ?[name] RN, MSN, CCM ?Community Care Coordinator ?[place]  [facility] ?[facility] ?Mobile: [phone]  ?

## 2021-08-28 NOTE — Chronic Care Management (AMB) (Signed)
Chronic Care Management   CCM RN Visit Note  08/28/2021 Name: Nicholas Gross MRN: [id number] DOB: December 10, 1927  Subjective: Nicholas Gross is a 88 y.o. year old male who is a primary care patient of [name], DO. The care management team was consulted for assistance with disease management and care coordination needs.    Engaged with patient by telephone for follow up visit in response to provider referral for case management and/or care coordination services.   Consent to Services:  The patient was given information about Chronic Care Management services, agreed to services, and gave verbal consent prior to initiation of services.  Please see initial visit note for detailed documentation.   Patient agreed to services and verbal consent obtained.   Assessment: Review of patient past medical history, allergies, medications, health status, including review of consultants reports, laboratory and other test data, was performed as part of comprehensive evaluation and provision of chronic care management services.   SDOH (Social Determinants of Health) assessments and interventions performed:    CCM Care Plan  Allergies  Allergen Reactions   Other     Patient states that he is allergic to something that they place in the IV before they work on you   Simvastatin Other (See Comments)    Myalgia     Outpatient Encounter Medications as of 08/28/2021  Medication Sig   aspirin EC 81 MG tablet Take 1 tablet (81 mg total) by mouth daily.   lisinopril (ZESTRIL) 5 MG tablet Take 1 tablet (5 mg total) by mouth 2 (two) times daily.   Multiple Vitamins-Minerals (PRESERVISION AREDS PO) Take by mouth 2 (two) times daily.   rosuvastatin (CRESTOR) 10 MG tablet Take 1 tablet (10 mg total) by mouth daily.   No facility-administered encounter medications on file as of 08/28/2021.    Patient Active Problem List   Diagnosis Date Noted   PAD (peripheral artery disease) ([place]) 07/15/2020    Lymphedema 05/06/2019   Hyperlipemia 02/13/2016   Macular degeneration 08/15/2015   Carotid atherosclerosis 08/15/2015   Allergic rhinitis due to pollen 02/12/2015   Hypertension    CAD (coronary artery disease)    H/O cardiac catheterization 06/12/2014   History of left-sided carotid endarterectomy 06/12/2014    Conditions to be addressed/monitored:CAD, HTN, HLD, and Falls   Care Plan : RNCM: General Plan of Care (Adult) for Chronic Disease Management and Care Coordination Needs  Updates made by Vanita Ingles, RN since 08/28/2021 12:00 AM     Problem: RNCM: Development of Plan of Care for Chronic Disease Management (HTN, HLD, CAD, Falls)   Priority: High     Long-Range Goal: RNCM: Effective Management  of Plan of Care for Chronic Disease Management (HTN, HLD, CAD, Falls)   Start Date: 05/14/2021  Expected End Date: 05/14/2022  Priority: High  Note:   Current Barriers:  Knowledge Deficits related to plan of care for management of CAD, HTN, and HLD, Falls  Chronic Disease Management support and education needs related to CAD, HTN, and HLD, Falls  RNCM Clinical Goal(s):  Patient will verbalize basic understanding of CAD, HTN, and HLD, Falls,  disease process and self health management plan as evidenced by keeping appointments, following plan of care, and working with the CCM team to effectively manage health and well being.  take all medications exactly as prescribed and will call provider for medication related questions as evidenced by taking medications as prescribed and calling for refills before running out of medications  attend all scheduled medical appointments: 11-08-2021 at 10 am as evidenced by keeping appointments and calling the office for any schedule change needs         demonstrate improved and ongoing adherence to prescribed treatment plan for CAD, HTN, and HLD, falls, as evidenced by stable conditions, no acute changes in health and well being, and working with the  CCM team for new needs or concerns demonstrate ongoing self health care management ability for effective management of chronic conditions as evidenced by working with the CCM team through collaboration with Consulting civil engineer, provider, and care team.   Interventions: 1:1 collaboration with primary care provider regarding development and update of comprehensive plan of care as evidenced by provider attestation and co-signature Inter-disciplinary care team collaboration (see longitudinal plan of care) Evaluation of current treatment plan related to  self management and patient's adherence to plan as established by provider   CAD  (Status: Goal on Track (progressing): YES.) Long Term Goal  BP Readings from Last 3 Encounters:  08/06/21 (!) 178/80  05/10/21 (!) 168/72  11/05/20 (!) 169/77    Lab Results  Component Value Date   CHOL 116 05/10/2021   HDL 54 05/10/2021   [place] 46 05/10/2021   TRIG 79 05/10/2021    Assessed understanding of CAD diagnosis. 08-28-2021: The patient has a good understanding of his CAD and heart health. The patient saw the cardiologist in February but did not want to change his medications. The patient states his blood pressures are always elevated when he goes to provider visits. Education and support given.  Medications reviewed including medications utilized in CAD treatment plan. 08-28-2021: The patient is compliant with medications. Denies any issues.  Provided education on importance of blood pressure control in management of CAD; Provided education on Importance of limiting foods high in cholesterol. 08-28-2021: The patient is doing well and eating good.  Counseled on importance of regular laboratory monitoring as prescribed; Counseled on the importance of exercise goals with target of 150 minutes per week. 08-28-2021: The patient usually does 40 to 45 pushups each morning. He did 50 today and feels like he could do more. The patient had not walked today but will. He  usually gets up between 330 and 4 am daily. He ate breakfast at 430 am. This is his daily routine. Denies any acute findings.  Reviewed Importance of taking all medications as prescribed. 08-28-2021: Is compliant with medications. Denies any issues with medication compliance.  Reviewed Importance of attending all scheduled provider appointments. Next appointment in May of 2023, reminder given today Advised to report any changes in symptoms or exercise tolerance. 08-28-2021: No issues with activity or his routine for exercising Screening for signs and symptoms of depression related to chronic disease state;  Assessed social determinant of health barriers;   Hyperlipidemia:  (Status: Goal on Track (progressing): YES.) Long Term Goal  Lab Results  Component Value Date   CHOL 116 05/10/2021   HDL 54 05/10/2021   LDLCALC 46 05/10/2021   TRIG 79 05/10/2021     Medication review performed; medication list updated in electronic medical record. 08-28-2021: The patient takes Crestor 10 mg daily  Provider established cholesterol goals reviewed. 07-10-2021: The patient within goals. Praised for good results.  Counseled on importance of regular laboratory monitoring as prescribed. 08-28-2021: The patient gets regular lab testing  Provided HLD educational materials; Reviewed role and benefits of statin for ASCVD risk reduction; Discussed strategies to manage statin-induced myalgias. 08-28-2021: Denies any changes in  condition related to statin use; Reviewed importance of limiting foods high in cholesterol. 08-28-2021: Review and education provided; Reviewed exercise goals and target of 150 minutes per week;  Hypertension: (Status: Goal on Track (progressing): YES.) Last practice recorded BP readings:  BP Readings from Last 3 Encounters:  08/06/21 (!) 178/80  05/10/21 (!) 168/72  11/05/20 (!) 169/77  Known white coat syndrome Most recent eGFR/CrCl:  Lab Results  Component Value Date   EGFR 84 05/10/2021     No components found for: CRCL  Evaluation of current treatment plan related to hypertension self management and patient's adherence to plan as established by provider.  05-14-2021: The patient is doing well. Saw pcp recently. The patient has usual elevated blood pressure readings in the office. The patient states that he is still very active and does his push-ups and walking daily. The patient states he does not sleep a lot at night but he goes to bed early and sometimes gets up and sleeps in the recliner the rest of the night, sometimes takes naps during the day. Is looking forward to being with his family for Thanksgiving. States they will come and get him and take him to their house and then bring him back home;  07-10-2021: The patient states he is doing well. He is celebrating his 89nd birthday today. He denies any issues with his blood pressures. Did loose his balance day before yesterday and had a fall. Denies dizziness, light headedness or headaches. Will continue to monitor for changes. 08-28-2021: The patient states his blood pressures are always high when he goes to see the doctor because he gets nervous. The patient does not check at home but states he feels fine. Denied wanting to increase or change his blood pressure medications at the cardiologist in February. Will continue to monitor for changes or needs.  Provided education to patient re: stroke prevention, s/s of heart attack and stroke; Reviewed prescribed diet heart healthy Reviewed medications with patient and discussed importance of compliance. 08-28-2021: The patient states that he is compliant with medications;  Discussed plans with patient for ongoing care management follow up and provided patient with direct contact information for care management team; Advised patient, providing education and rationale, to monitor blood pressure daily and record, calling PCP for findings outside established parameters;  Advised patient to discuss blood  pressure trends with provider; Provided education on prescribed diet heart healthy;  Discussed complications of poorly controlled blood pressure such as heart disease, stroke, circulatory complications, vision complications, kidney impairment, sexual dysfunction;  Evaluation of white coat syndrome, the patient has known history of white coat syndrome. Denies any issues with blood pressures at home.    Falls:  (Status: Goal on Track (progressing): YES.) Long Term Goal  Provided written and verbal education re: potential causes of falls and Fall prevention strategies Reviewed medications and discussed potential side effects of medications such as dizziness and frequent urination Advised patient of importance of notifying provider of falls. 07-10-2021: The patient had a fall day before yesterday and landed on his right hip. The patient states that it was a little sore but not bad. He denies hitting his head. Education on falls prevention and safety. States he lost his balance and fell.  Assessed for signs and symptoms of orthostatic hypotension Assessed for falls since last encounter. 07-10-2021: Last fall on 07-08-2021. States he was "feeding Lily" and lost his balance. He has a bruise on his right hip. States it is sore but nothing he can't  handle. Education and support given. 08-28-2021: Denies any new falls. Is trying to be careful and mindful of high risk for falls. Assessed patients knowledge of fall risk prevention secondary to previously provided education Provided patient information for fall alert systems Assessed working status of life alert bracelet and patient adherence Advised patient to discuss falls and safety  with provider Screening for signs and symptoms of depression related to chronic disease state Assessed social determinant of health barriers   Patient Goals/Self-Care Activities: Take medications as prescribed   Attend all scheduled provider appointments Call pharmacy for  medication refills 3-7 days in advance of running out of medications Attend church or other social activities Perform all self care activities independently  Perform IADL's (shopping, preparing meals, housekeeping, managing finances) independently Call provider office for new concerns or questions  Work with the social worker to address care coordination needs and will continue to work with the clinical team to address health care and disease management related needs call 911 call the Suicide and Crisis Lifeline: 988 call the [place] National Suicide Prevention Lifeline: (743) 843-9699 call 1-800-273-TALK (toll free, 24 hour hotline) if experiencing a [place] or [place]  check blood pressure weekly choose a place to take my blood pressure (home, clinic or office, retail store) write blood pressure results in a log or diary learn about high blood pressure keep a blood pressure log take blood pressure log to all doctor appointments call doctor for signs and symptoms of high blood pressure develop an action plan for high blood pressure keep all doctor appointments take medications for blood pressure exactly as prescribed report new symptoms to your doctor eat more whole grains, fruits and vegetables, lean meats and healthy fats - call for medicine refill 2 or 3 days before it runs out - take all medications exactly as prescribed - call doctor with any symptoms you believe are related to your medicine - call doctor when you experience any new symptoms - go to all doctor appointments as scheduled - adhere to prescribed diet: heart healthy diet        Plan:Telephone follow up appointment with care management team member scheduled for:  10-30-2021 at 0900 am  [place], MSN, [facility] Mobile: [phone]

## 2021-09-20 DIAGNOSIS — I2510 Atherosclerotic heart disease of native coronary artery without angina pectoris: Secondary | ICD-10-CM | POA: Diagnosis not present

## 2021-09-20 DIAGNOSIS — I10 Essential (primary) hypertension: Secondary | ICD-10-CM | POA: Diagnosis not present

## 2021-09-20 DIAGNOSIS — E785 Hyperlipidemia, unspecified: Secondary | ICD-10-CM | POA: Diagnosis not present

## 2021-10-25 ENCOUNTER — Ambulatory Visit: Payer: Medicare Other

## 2021-10-30 ENCOUNTER — Telehealth: Payer: Medicare Other

## 2021-10-30 ENCOUNTER — Ambulatory Visit (INDEPENDENT_AMBULATORY_CARE_PROVIDER_SITE_OTHER): Payer: Medicare Other

## 2021-10-30 DIAGNOSIS — I2510 Atherosclerotic heart disease of native coronary artery without angina pectoris: Secondary | ICD-10-CM

## 2021-10-30 DIAGNOSIS — Z9181 History of falling: Secondary | ICD-10-CM

## 2021-10-30 DIAGNOSIS — E782 Mixed hyperlipidemia: Secondary | ICD-10-CM

## 2021-10-30 DIAGNOSIS — I10 Essential (primary) hypertension: Secondary | ICD-10-CM

## 2021-10-30 NOTE — Patient Instructions (Signed)
Visit Information ? ?Thank you for taking time to visit with me today. Please don't hesitate to contact me if I can be of assistance to you before our next scheduled telephone appointment. ? ?Following are the goals we discussed today:  ?CAD  (Status: Goal on Track (progressing): YES.) Long Term Goal  ?   ?BP Readings from Last 3 Encounters:  ?08/06/21 (!) 178/80  ?05/10/21 (!) 168/72  ?11/05/20 (!) 169/77  ?  ?     ?Lab Results  ?Component Value Date  ?  CHOL 116 05/10/2021  ?  HDL 54 05/10/2021  ?  Nicholas Gross 46 05/10/2021  ?  TRIG 79 05/10/2021  ?  ?Assessed understanding of CAD diagnosis. 08-28-2021: The patient has a good understanding of his CAD and heart health. The patient saw the cardiologist in February but did not want to change his medications. The patient states his blood pressures are always elevated when he goes to provider visits. Education and support given. 10-30-2021: The patient has a follow up with cardiologist on 5-15 and the pcp on 5-19. The patient states he does not feel he needs to see all this doctors so often. The patient states that he feels great. Education and support given. He does say his blood pressures are always high when he goes to the doctor but he thinks it is their equipment. ?Medications reviewed including medications utilized in CAD treatment plan. 10-30-2021: The patient is compliant with medications. Denies any issues. His niece helps him with his medications. Denies any new concerns. ?Provided education on importance of blood pressure control in management of CAD; ?Provided education on Importance of limiting foods high in cholesterol. 10-30-2021: The patient is doing well and eating good. His niece brings him food and he usually will take it out of the freezer to eat. He forgot to take out a meal last night and he was fixing some black-eyed peas and corn for him to eat. Denies any issues with dietary compliance. ?Counseled on importance of regular laboratory monitoring as  prescribed. 10-30-2021: The patient has blood work on a regular basis. The patient will likely have new blood work on 11-08-2021; ?Counseled on the importance of exercise goals with target of 150 minutes per week. 08-28-2021: The patient usually does 40 to 45 pushups each morning. He did 50 today and feels like he could do more. The patient had not walked today but will. He usually gets up between 330 and 4 am daily. He ate breakfast at 430 am. This is his daily routine. Denies any acute findings. 10-30-2021: The patient has not done his exercises this am. The patient states he forgets sometimes but he will get to it. He doesn't always sleep well but states sometimes he has to get in his recliner and then will go back to sleep. The patient denies any new concerns at this time. Will continue to monitor.  ?Reviewed Importance of taking all medications as prescribed. 10-30-2021: Is compliant with medications. Denies any issues with medication compliance.  ?Reviewed Importance of attending all scheduled provider appointments. Next appointment in May of 2023, reminder given today. Has appointment on 11-04-2021 with the cardiologist and 11-08-2021 with the pcp. His niece takes him to his appointments.  ?Advised to report any changes in symptoms or exercise tolerance. 11-08-2021: No issues with activity or his routine for exercising ?Screening for signs and symptoms of depression related to chronic disease state;  ?Assessed social determinant of health barriers;  ?  ?Hyperlipidemia:  (Status: Goal on  Track (progressing): YES.) Long Term Goal  ?     ?Lab Results  ?Component Value Date  ?  CHOL 116 05/10/2021  ?  HDL 54 05/10/2021  ?  [place] 46 05/10/2021  ?  TRIG 79 05/10/2021  ?  ?  ?Medication review performed; medication list updated in electronic medical record. 10-30-2021: The patient takes Crestor 10 mg daily. Is compliant with medications.  ?Provider established cholesterol goals reviewed. 10-30-2021: The patient within goals.  Praised for good results.  ?Counseled on importance of regular laboratory monitoring as prescribed. 10-30-2021: The patient gets regular lab testing. Will likely have new testing on 11-08-2021 ?Provided HLD educational materials; ?Reviewed role and benefits of statin for ASCVD risk reduction; ?Discussed strategies to manage statin-induced myalgias. 08-28-2021: Denies any changes in condition related to statin use; ?Reviewed importance of limiting foods high in cholesterol. 10-30-2021: Review and education provided. His niece brings him already prepared freezer meals and he usually takes one out of the freezer each night; ?Reviewed exercise goals and target of 150 minutes per week; ?  ?Hypertension: (Status: Goal on Track (progressing): YES.) ?Last practice recorded BP readings:  ?   ?BP Readings from Last 3 Encounters:  ?08/06/21 (!) 178/80  ?05/10/21 (!) 168/72  ?11/05/20 (!) 169/77  ?Known white coat syndrome ?Most recent eGFR/CrCl:  ?     ?Lab Results  ?Component Value Date  ?  EGFR 84 05/10/2021  ?  No components found for: CRCL ?  ?Evaluation of current treatment plan related to hypertension self management and patient's adherence to plan as established by provider.  05-14-2021: The patient is doing well. Saw pcp recently. The patient has usual elevated blood pressure readings in the office. The patient states that he is still very active and does his push-ups and walking daily. The patient states he does not sleep a lot at night but he goes to bed early and sometimes gets up and sleeps in the recliner the rest of the night, sometimes takes naps during the day. Is looking forward to being with his family for Thanksgiving. States they will come and get him and take him to their house and then bring him back home;  07-10-2021: The patient states he is doing well. He is celebrating his 89nd birthday today. He denies any issues with his blood pressures. Did loose his balance day before yesterday and had a fall. Denies  dizziness, light headedness or headaches. Will continue to monitor for changes. 08-28-2021: The patient states his blood pressures are always high when he goes to see the doctor because he gets nervous. The patient does not check at home but states he feels fine. Denied wanting to increase or change his blood pressure medications at the cardiologist in February. Will continue to monitor for changes or needs. 10-30-2021: The patient states he is doing well and denies any new concerns with his HTN or heart health. Does not take at home. States he is continuing with his regular routine. Will continue to monitor for changes.  ?Provided education to patient re: stroke prevention, s/s of heart attack and stroke; ?Reviewed prescribed diet heart healthy ?Reviewed medications with patient and discussed importance of compliance. 10-30-2021: The patient states that he is compliant with medications;  ?Discussed plans with patient for ongoing care management follow up and provided patient with direct contact information for care management team; ?Advised patient, providing education and rationale, to monitor blood pressure daily and record, calling PCP for findings outside established parameters;  ?Advised patient  to discuss blood pressure trends with provider; ?Provided education on prescribed diet heart healthy;  ?Discussed complications of poorly controlled blood pressure such as heart disease, stroke, circulatory complications, vision complications, kidney impairment, sexual dysfunction;  ?Evaluation of white coat syndrome, the patient has known history of white coat syndrome. Denies any issues with blood pressures at home.  ?  ?  ?Falls:  (Status: Goal on Track (progressing): YES.) Long Term Goal  ?Provided written and verbal education re: potential causes of falls and Fall prevention strategies ?Reviewed medications and discussed potential side effects of medications such as dizziness and frequent urination ?Advised patient of  importance of notifying provider of falls. 07-10-2021: The patient had a fall day before yesterday and landed on his right hip. The patient states that it was a little sore but not bad. He denies hitting his

## 2021-10-30 NOTE — Chronic Care Management (AMB) (Signed)
?Chronic Care Management  ? ?CCM RN Visit Note ? ?10/30/2021 ?Name: Nicholas Gross MRN: [id number] DOB: 04-Dec-1927 ? ?Subjective: ?Nicholas Gross is a 88 y.o. year old male who is a primary care patient of [name], DO. The care management team was consulted for assistance with disease management and care coordination needs.   ? ?Engaged with patient by telephone for follow up visit in response to provider referral for case management and/or care coordination services.  ? ?Consent to Services:  ?The patient was given information about Chronic Care Management services, agreed to services, and gave verbal consent prior to initiation of services.  Please see initial visit note for detailed documentation.  ? ?Patient agreed to services and verbal consent obtained.  ? ?Assessment: Review of patient past medical history, allergies, medications, health status, including review of consultants reports, laboratory and other test data, was performed as part of comprehensive evaluation and provision of chronic care management services.  ? ?SDOH (Social Determinants of Health) assessments and interventions performed:   ? ?CCM Care Plan ? ?Allergies  ?Allergen Reactions  ? Other   ?  Patient states that he is allergic to something that they place in the IV before they work on you  ? Simvastatin Other (See Comments)  ?  Myalgia ?  ? ? ?Outpatient Encounter Medications as of 10/30/2021  ?Medication Sig  ? aspirin EC 81 MG tablet Take 1 tablet (81 mg total) by mouth daily.  ? lisinopril (ZESTRIL) 5 MG tablet Take 1 tablet (5 mg total) by mouth 2 (two) times daily.  ? Multiple Vitamins-Minerals (PRESERVISION AREDS PO) Take by mouth 2 (two) times daily.  ? rosuvastatin (CRESTOR) 10 MG tablet Take 1 tablet (10 mg total) by mouth daily.  ? ?No facility-administered encounter medications on file as of 10/30/2021.  ? ? ?Patient Active Problem List  ? Diagnosis Date Noted  ? PAD (peripheral artery disease) ([place]) 07/15/2020  ?  Lymphedema 05/06/2019  ? Hyperlipemia 02/13/2016  ? Macular degeneration 08/15/2015  ? Carotid atherosclerosis 08/15/2015  ? Allergic rhinitis due to pollen 02/12/2015  ? Hypertension   ? CAD (coronary artery disease)   ? H/O cardiac catheterization 06/12/2014  ? History of left-sided carotid endarterectomy 06/12/2014  ? ? ?Conditions to be addressed/monitored:CAD, HTN, HLD, and history of falls ? ?Care Plan : RNCM: General Plan of Care (Adult) for Chronic Disease Management and Care Coordination Needs  ?Updates made by Vanita Ingles, RN since 10/30/2021 12:00 AM  ?  ? ?Problem: RNCM: Development of Plan of Care for Chronic Disease Management (HTN, HLD, CAD, Falls)   ?Priority: High  ?  ? ?Long-Range Goal: RNCM: Effective Management  of Plan of Care for Chronic Disease Management (HTN, HLD, CAD, Falls)   ?Start Date: 05/14/2021  ?Expected End Date: 05/14/2022  ?Priority: High  ?Note:   ?Current Barriers:  ?Knowledge Deficits related to plan of care for management of CAD, HTN, and HLD, Falls  ?Chronic Disease Management support and education needs related to CAD, HTN, and HLD, Falls ? ?RNCM Clinical Goal(s):  ?Patient will verbalize basic understanding of CAD, HTN, and HLD, Falls,  disease process and self health management plan as evidenced by keeping appointments, following plan of care, and working with the CCM team to effectively manage health and well being.  ?take all medications exactly as prescribed and will call provider for medication related questions as evidenced by taking medications as prescribed and calling for refills before running out of medications     ?  attend all scheduled medical appointments: 11-08-2021 at 10 am as evidenced by keeping appointments and calling the office for any schedule change needs         ?demonstrate improved and ongoing adherence to prescribed treatment plan for CAD, HTN, and HLD, falls, as evidenced by stable conditions, no acute changes in health and well being, and  working with the CCM team for new needs or concerns ?demonstrate ongoing self health care management ability for effective management of chronic conditions as evidenced by working with the CCM team through collaboration with Consulting civil engineer, provider, and care team.  ? ?Interventions: ?1:1 collaboration with primary care provider regarding development and update of comprehensive plan of care as evidenced by provider attestation and co-signature ?Inter-disciplinary care team collaboration (see longitudinal plan of care) ?Evaluation of current treatment plan related to  self management and patient's adherence to plan as established by provider ? ? ?CAD  (Status: Goal on Track (progressing): YES.) Long Term Goal  ?BP Readings from Last 3 Encounters:  ?08/06/21 (!) 178/80  ?05/10/21 (!) 168/72  ?11/05/20 (!) 169/77  ?  ?Lab Results  ?Component Value Date  ? CHOL 116 05/10/2021  ? HDL 54 05/10/2021  ? [place] 46 05/10/2021  ? TRIG 79 05/10/2021  ?  ?Assessed understanding of CAD diagnosis. 08-28-2021: The patient has a good understanding of his CAD and heart health. The patient saw the cardiologist in February but did not want to change his medications. The patient states his blood pressures are always elevated when he goes to provider visits. Education and support given. 10-30-2021: The patient has a follow up with cardiologist on 5-15 and the pcp on 5-19. The patient states he does not feel he needs to see all this doctors so often. The patient states that he feels great. Education and support given. He does say his blood pressures are always high when he goes to the doctor but he thinks it is their equipment. ?Medications reviewed including medications utilized in CAD treatment plan. 10-30-2021: The patient is compliant with medications. Denies any issues. His niece helps him with his medications. Denies any new concerns. ?Provided education on importance of blood pressure control in management of CAD; ?Provided education  on Importance of limiting foods high in cholesterol. 10-30-2021: The patient is doing well and eating good. His niece brings him food and he usually will take it out of the freezer to eat. He forgot to take out a meal last night and he was fixing some black-eyed peas and corn for him to eat. Denies any issues with dietary compliance. ?Counseled on importance of regular laboratory monitoring as prescribed. 10-30-2021: The patient has blood work on a regular basis. The patient will likely have new blood work on 11-08-2021; ?Counseled on the importance of exercise goals with target of 150 minutes per week. 08-28-2021: The patient usually does 40 to 45 pushups each morning. He did 50 today and feels like he could do more. The patient had not walked today but will. He usually gets up between 330 and 4 am daily. He ate breakfast at 430 am. This is his daily routine. Denies any acute findings. 10-30-2021: The patient has not done his exercises this am. The patient states he forgets sometimes but he will get to it. He doesn't always sleep well but states sometimes he has to get in his recliner and then will go back to sleep. The patient denies any new concerns at this time. Will continue to monitor.  ?Reviewed  Importance of taking all medications as prescribed. 10-30-2021: Is compliant with medications. Denies any issues with medication compliance.  ?Reviewed Importance of attending all scheduled provider appointments. Next appointment in May of 2023, reminder given today. Has appointment on 11-04-2021 with the cardiologist and 11-08-2021 with the pcp. His niece takes him to his appointments.  ?Advised to report any changes in symptoms or exercise tolerance. 11-08-2021: No issues with activity or his routine for exercising ?Screening for signs and symptoms of depression related to chronic disease state;  ?Assessed social determinant of health barriers;  ? ?Hyperlipidemia:  (Status: Goal on Track (progressing): YES.) Long Term Goal   ?Lab Results  ?Component Value Date  ? CHOL 116 05/10/2021  ? HDL 54 05/10/2021  ? [place] 46 05/10/2021  ? TRIG 79 05/10/2021  ?  ? ?Medication review performed; medication list updated in electronic medical

## 2021-10-30 NOTE — Progress Notes (Deleted)
NO SHOW

## 2021-11-04 ENCOUNTER — Ambulatory Visit: Payer: Medicare Other | Admitting: Cardiovascular Disease

## 2021-11-04 DIAGNOSIS — I739 Peripheral vascular disease, unspecified: Secondary | ICD-10-CM

## 2021-11-04 DIAGNOSIS — I6529 Occlusion and stenosis of unspecified carotid artery: Secondary | ICD-10-CM

## 2021-11-04 DIAGNOSIS — I890 Lymphedema, not elsewhere classified: Secondary | ICD-10-CM

## 2021-11-04 DIAGNOSIS — I10 Essential (primary) hypertension: Secondary | ICD-10-CM

## 2021-11-04 DIAGNOSIS — I25118 Atherosclerotic heart disease of native coronary artery with other forms of angina pectoris: Secondary | ICD-10-CM

## 2021-11-04 DIAGNOSIS — E782 Mixed hyperlipidemia: Secondary | ICD-10-CM

## 2021-11-05 ENCOUNTER — Encounter: Payer: Self-pay | Admitting: Cardiovascular Disease

## 2021-11-08 ENCOUNTER — Ambulatory Visit (INDEPENDENT_AMBULATORY_CARE_PROVIDER_SITE_OTHER): Payer: Medicare Other | Admitting: Family Medicine

## 2021-11-08 ENCOUNTER — Encounter: Payer: Self-pay | Admitting: Family Medicine

## 2021-11-08 VITALS — BP 156/68 | HR 50 | Temp 97.9°F | Wt 167.0 lb

## 2021-11-08 DIAGNOSIS — I739 Peripheral vascular disease, unspecified: Secondary | ICD-10-CM | POA: Diagnosis not present

## 2021-11-08 DIAGNOSIS — I10 Essential (primary) hypertension: Secondary | ICD-10-CM

## 2021-11-08 DIAGNOSIS — Z0000 Encounter for general adult medical examination without abnormal findings: Secondary | ICD-10-CM

## 2021-11-08 DIAGNOSIS — I6529 Occlusion and stenosis of unspecified carotid artery: Secondary | ICD-10-CM | POA: Diagnosis not present

## 2021-11-08 DIAGNOSIS — D692 Other nonthrombocytopenic purpura: Secondary | ICD-10-CM | POA: Insufficient documentation

## 2021-11-08 DIAGNOSIS — I2510 Atherosclerotic heart disease of native coronary artery without angina pectoris: Secondary | ICD-10-CM | POA: Diagnosis not present

## 2021-11-08 DIAGNOSIS — E782 Mixed hyperlipidemia: Secondary | ICD-10-CM | POA: Diagnosis not present

## 2021-11-08 LAB — URINALYSIS, ROUTINE W REFLEX MICROSCOPIC
Bilirubin, UA: NEGATIVE
Glucose, UA: NEGATIVE
Ketones, UA: NEGATIVE
Leukocytes,UA: NEGATIVE
Nitrite, UA: NEGATIVE
Protein,UA: NEGATIVE
RBC, UA: NEGATIVE
Specific Gravity, UA: 1.01 (ref 1.005–1.030)
Urobilinogen, Ur: 0.2 mg/dL (ref 0.2–1.0)
pH, UA: 7 (ref 5.0–7.5)

## 2021-11-08 LAB — MICROALBUMIN, URINE WAIVED
Creatinine, Urine Waived: 50 mg/dL (ref 10–300)
Microalb, Ur Waived: 10 mg/L (ref 0–19)
Microalb/Creat Ratio: <30 mg/g (ref <30)

## 2021-11-08 MED ORDER — LISINOPRIL 5 MG PO TABS: 5.0000 mg | ORAL_TABLET | Freq: Two times a day (BID) | ORAL | 1 refills | Status: DC

## 2021-11-08 MED ORDER — ASPIRIN 81 MG PO TBEC: 81.0000 mg | DELAYED_RELEASE_TABLET | Freq: Every day | ORAL | 4 refills | Status: DC

## 2021-11-08 MED ORDER — ROSUVASTATIN CALCIUM 10 MG PO TABS
10.0000 mg | ORAL_TABLET | Freq: Every day | ORAL | 1 refills | Status: DC
Start: 2021-11-08 — End: 2022-05-28

## 2021-11-08 NOTE — Assessment & Plan Note (Signed)
Stable. Continue to monitor. Call with any concerns.  ?

## 2021-11-08 NOTE — Assessment & Plan Note (Signed)
Will keep BP and cholesterol under good control. Continue to monitor. Call with any concerns.  

## 2021-11-08 NOTE — Assessment & Plan Note (Signed)
Running slightly high here, but better at home. Continue current regimen. Continue to monitor. Call with any concerns.

## 2021-11-08 NOTE — Progress Notes (Signed)
BP (!) 156/68   Pulse (!) 50   Temp 97.9 F (36.6 C)   Wt 167 lb (75.8 kg)   SpO2 96%   BMI 24.66 kg/m    Subjective:    Patient ID: Nicholas Gross, male    DOB: 1928-01-11, 88 y.o.   MRN: [id number]  HPI: Nicholas Gross is a 88 y.o. male presenting on 11/08/2021 for comprehensive medical examination. Current medical complaints include:  HYPERTENSION / HYPERLIPIDEMIA Satisfied with current treatment? yes Duration of hypertension: chronic BP monitoring frequency: a few times a month BP medication side effects: no Past BP meds: lisinopril Duration of hyperlipidemia: chronic Cholesterol medication side effects: no Cholesterol supplements: none Past cholesterol medications: crestor Medication compliance: excellent compliance Aspirin: yes Recent stressors: no Recurrent headaches: no Visual changes: no Palpitations: no Dyspnea: no Chest pain: no Lower extremity edema: no Dizzy/lightheaded: yes  Interim Problems from his last visit: no  Depression Screen done today and results listed below:     05/13/2021   10:12 AM 10/30/2020   10:46 AM 10/19/2019    8:20 AM 08/09/2019    9:15 AM 08/30/2018    9:07 AM  Depression screen PHQ 2/9  Decreased Interest 0 0 0 0 0  Down, Depressed, Hopeless 0 0 0 0 0  PHQ - 2 Score 0 0 0 0 0  Altered sleeping     0  Tired, decreased energy     0  Change in appetite     0  Feeling bad or failure about yourself      0  Trouble concentrating     0  Moving slowly or fidgety/restless     0  Suicidal thoughts     0  PHQ-9 Score     0  Difficult doing work/chores     Not difficult at all    Past Medical History:  Past Medical History:  Diagnosis Date   CAD (coronary artery disease)    History of GI bleed 2008   History of tobacco use    17 pack years, quit around 1970   Hyperlipidemia    Hypertension    Overweight     Surgical History:  Past Surgical History:  Procedure Laterality Date   CAROTID ENDARTERECTOMY Left 2010    CATARACT EXTRACTION     cypher stent  09/12/02   s/p cypher stent mid-LAD   TENDON REPAIR  1991   finger and arm    Medications:  Current Outpatient Medications on File Prior to Visit  Medication Sig   Multiple Vitamins-Minerals (PRESERVISION AREDS PO) Take by mouth 2 (two) times daily.   No current facility-administered medications on file prior to visit.    Allergies:  Allergies  Allergen Reactions   Other     Patient states that he is allergic to something that they place in the IV before they work on you   Simvastatin Other (See Comments)    Myalgia     Social History:  Social History   Socioeconomic History   Marital status: Widowed    Spouse name: Not on file   Number of children: Not on file   Years of education: some college in [place]    Highest education level: High school graduate  Occupational History   Occupation: retired   Tobacco Use   Smoking status: Former    Packs/day: 1.00    Years: 17.00    Pack years: 17.00    Types: Cigarettes    Quit date:  06/23/1968    Years since quitting: 53.4   Smokeless tobacco: Never  Vaping Use   Vaping Use: Never used  Substance and Sexual Activity   Alcohol use: No   Drug use: No   Sexual activity: Not Currently  Other Topics Concern   Not on file  Social History Narrative   Attends church, neighbor take him    Social Determinants of Health   Financial Resource Strain: Low Risk    Difficulty of Paying Living Expenses: Not hard at all  Food Insecurity: No Food Insecurity   Worried About Charity fundraiser in the Last Year: Never true   Arboriculturist in the Last Year: Never true  Transportation Needs: No Transportation Needs   Lack of Transportation (Medical): No   Lack of Transportation (Non-Medical): No  Physical Activity: Unknown   Days of Exercise per Week: 2 days   Minutes of Exercise per Session: Not on file  Stress: No Stress Concern Present   Feeling of Stress : Not at all  Social Connections:  Moderately Isolated   Frequency of Communication with Friends and Family: Once a week   Frequency of Social Gatherings with Friends and Family: Twice a week   Attends Religious Services: More than 4 times per year   Active Member of [employer] or Organizations: No   Attends Archivist Meetings: Never   Marital Status: Widowed  Human resources officer Violence: Not At Risk   Fear of Current or Ex-Partner: No   Emotionally Abused: No   Physically Abused: No   Sexually Abused: No   Social History   Tobacco Use  Smoking Status Former   Packs/day: 1.00   Years: 17.00   Pack years: 17.00   Types: Cigarettes   Quit date: 06/23/1968   Years since quitting: 53.4  Smokeless Tobacco Never   Social History   Substance and Sexual Activity  Alcohol Use No    Family History:  Family History  Problem Relation Age of Onset   Heart disease Father        possibly   Cancer Sister        breast   AAA (abdominal aortic aneurysm) Brother    Cancer Sister        lung    Past medical history, surgical history, medications, allergies, family history and social history reviewed with patient today and changes made to appropriate areas of the chart.   Review of Systems  Constitutional: Negative.   HENT:  Positive for congestion. Negative for ear discharge, ear pain, hearing loss, nosebleeds, sinus pain, sore throat and tinnitus.   Eyes:  Positive for blurred vision. Negative for double vision, photophobia, pain, discharge and redness.  Respiratory: Negative.  Negative for stridor.   Cardiovascular: Negative.   Gastrointestinal: Negative.   Genitourinary: Negative.   Musculoskeletal: Negative.   Skin: Negative.   Neurological: Negative.   Endo/Heme/Allergies:  Positive for polydipsia. Negative for environmental allergies. Bruises/bleeds easily.  Psychiatric/Behavioral: Negative.    All other ROS negative except what is listed above and in the HPI.      Objective:    BP (!) 156/68    Pulse (!) 50   Temp 97.9 F (36.6 C)   Wt 167 lb (75.8 kg)   SpO2 96%   BMI 24.66 kg/m   Wt Readings from Last 3 Encounters:  11/08/21 167 lb (75.8 kg)  08/06/21 166 lb 4 oz (75.4 kg)  05/10/21 166 lb (75.3 kg)  Physical Exam Vitals and nursing note reviewed.  Constitutional:      General: He is not in acute distress.    Appearance: Normal appearance. He is normal weight. He is not ill-appearing, toxic-appearing or diaphoretic.  HENT:     Head: Normocephalic and atraumatic.     Right Ear: Tympanic membrane, ear canal and external ear normal. There is no impacted cerumen.     Left Ear: Tympanic membrane, ear canal and external ear normal. There is no impacted cerumen.     Nose: Nose normal. No congestion or rhinorrhea.     Mouth/Throat:     Mouth: Mucous membranes are moist.     Pharynx: Oropharynx is clear. No oropharyngeal exudate or posterior oropharyngeal erythema.  Eyes:     General: No scleral icterus.       Right eye: No discharge.        Left eye: No discharge.     Extraocular Movements: Extraocular movements intact.     Conjunctiva/sclera: Conjunctivae normal.     Pupils: Pupils are equal, round, and reactive to light.  Neck:     Vascular: No carotid bruit.  Cardiovascular:     Rate and Rhythm: Normal rate and regular rhythm.     Pulses: Normal pulses.     Heart sounds: No murmur heard.   No friction rub. No gallop.  Pulmonary:     Effort: Pulmonary effort is normal. No respiratory distress.     Breath sounds: Normal breath sounds. No stridor. No wheezing, rhonchi or rales.  Chest:     Chest wall: No tenderness.  Abdominal:     General: Abdomen is flat. Bowel sounds are normal. There is no distension.     Palpations: Abdomen is soft. There is no mass.     Tenderness: There is no abdominal tenderness. There is no right CVA tenderness, left CVA tenderness, guarding or rebound.     Hernia: No hernia is present.  Genitourinary:    Comments: Genital exam  deferred with shared decision making Musculoskeletal:        General: No swelling, tenderness, deformity or signs of injury.     Cervical back: Normal range of motion and neck supple. No rigidity. No muscular tenderness.     Right lower leg: No edema.     Left lower leg: No edema.  Lymphadenopathy:     Cervical: No cervical adenopathy.  Skin:    General: Skin is warm and dry.     Capillary Refill: Capillary refill takes less than 2 seconds.     Coloration: Skin is not jaundiced or pale.     Findings: No bruising, erythema, lesion or rash.  Neurological:     General: No focal deficit present.     Mental Status: He is alert and oriented to person, place, and time.     Cranial Nerves: No cranial nerve deficit.     Sensory: No sensory deficit.     Motor: No weakness.     Coordination: Coordination normal.     Gait: Gait normal.     Deep Tendon Reflexes: Reflexes normal.  Psychiatric:        Mood and Affect: Mood normal.        Behavior: Behavior normal.        Thought Content: Thought content normal.        Judgment: Judgment normal.    Results for orders placed or performed in visit on 08/08/21  ECHOCARDIOGRAM COMPLETE  Result Value Ref Range  AR max vel 1.45 cm2   AV Peak grad 17.0 mmHg   Ao pk vel 2.06 m/s   Area-P 1/2 2.14 cm2   AV Area VTI 1.27 cm2   AV Mean grad 10.2 mmHg   Single Plane A4C EF 52.6 %   Single Plane A2C EF 52.0 %   Calc EF 53.0 %   AV Area mean vel 1.31 cm2   MV VTI 1.53 cm2      Assessment & Plan:   Problem List Items Addressed This Visit       Cardiovascular and Mediastinum   Hypertension    Running slightly high here, but better at home. Continue current regimen. Continue to monitor. Call with any concerns.        Relevant Medications   lisinopril (ZESTRIL) 5 MG tablet   rosuvastatin (CRESTOR) 10 MG tablet   aspirin EC 81 MG tablet   Other Relevant Orders   Comprehensive metabolic panel   TSH   Urinalysis, Routine w reflex  microscopic   Microalbumin, Urine Waived   CAD (coronary artery disease)    Will keep BP and cholesterol under good control. Continue to monitor. Call with any concerns.       Relevant Medications   lisinopril (ZESTRIL) 5 MG tablet   rosuvastatin (CRESTOR) 10 MG tablet   aspirin EC 81 MG tablet   Other Relevant Orders   Comprehensive metabolic panel   TSH   Carotid atherosclerosis    Will keep BP and cholesterol under good control. Continue to monitor. Call with any concerns.       Relevant Medications   lisinopril (ZESTRIL) 5 MG tablet   rosuvastatin (CRESTOR) 10 MG tablet   aspirin EC 81 MG tablet   PAD (peripheral artery disease) ([facility])    Will keep BP and cholesterol under good control. Continue to monitor. Call with any concerns.        Relevant Medications   lisinopril (ZESTRIL) 5 MG tablet   rosuvastatin (CRESTOR) 10 MG tablet   aspirin EC 81 MG tablet   Senile purpura ([facility])    Stable. Continue to monitor. Call with any concerns.        Relevant Medications   lisinopril (ZESTRIL) 5 MG tablet   rosuvastatin (CRESTOR) 10 MG tablet   aspirin EC 81 MG tablet   Other Relevant Orders   Comprehensive metabolic panel   CBC with Differential/Platelet     Other   Hyperlipemia    Under good control on current regimen. Continue current regimen. Continue to monitor. Call with any concerns. Refills given. Labs drawn today.        Relevant Medications   lisinopril (ZESTRIL) 5 MG tablet   rosuvastatin (CRESTOR) 10 MG tablet   aspirin EC 81 MG tablet   Other Relevant Orders   Comprehensive metabolic panel   Lipid Panel w/o Chol/HDL Ratio   Other Visit Diagnoses     Routine general medical examination at a health care facility    -  Primary   Vaccines up to date. Screening labs checked today. Continue diet and exercise. Call with any concerns. Continue to montior.         Discussed aspirin prophylaxis for myocardial infarction prevention and decision was made to  continue ASA  LABORATORY TESTING:  Health maintenance labs ordered today as discussed above.   IMMUNIZATIONS:   - Tdap: Tetanus vaccination status reviewed: Declined. - Influenza: Postponed to flu season - Pneumovax: Up to date - Prevnar: Up to date -  COVID: Refused - HPV: Not applicable - Shingrix vaccine: Not applicable  SCREENING: - Colonoscopy: Not applicable  Discussed with patient purpose of the colonoscopy is to detect colon cancer at curable precancerous or early stages    PATIENT COUNSELING:    Sexuality: Discussed sexually transmitted diseases, partner selection, use of condoms, avoidance of unintended pregnancy  and contraceptive alternatives.   Advised to avoid cigarette smoking.  I discussed with the patient that most people either abstain from alcohol or drink within safe limits (<=14/week and <=4 drinks/occasion for males, <=7/weeks and <= 3 drinks/occasion for females) and that the risk for alcohol disorders and other health effects rises proportionally with the number of drinks per week and how often a drinker exceeds daily limits.  Discussed cessation/primary prevention of drug use and availability of treatment for abuse.   Diet: Encouraged to adjust caloric intake to maintain  or achieve ideal body weight, to reduce intake of dietary saturated fat and total fat, to limit sodium intake by avoiding high sodium foods and not adding table salt, and to maintain adequate dietary potassium and calcium preferably from fresh fruits, vegetables, and low-fat dairy products.    stressed the importance of regular exercise  Injury prevention: Discussed safety belts, safety helmets, smoke detector, smoking near bedding or upholstery.   Dental health: Discussed importance of regular tooth brushing, flossing, and dental visits.   Follow up plan: NEXT PREVENTATIVE PHYSICAL DUE IN 1 YEAR. Return in about 6 months (around 05/11/2022).

## 2021-11-08 NOTE — Assessment & Plan Note (Signed)
Under good control on current regimen. Continue current regimen. Continue to monitor. Call with any concerns. Refills given. Labs drawn today.   

## 2021-11-09 LAB — LIPID PANEL W/O CHOL/HDL RATIO
Cholesterol, Total: 107 mg/dL (ref 100–199)
HDL: 51 mg/dL (ref >39)
LDL Chol Calc (NIH): 41 mg/dL (ref 0–99)
Triglycerides: 73 mg/dL (ref 0–149)
VLDL Cholesterol Cal: 15 mg/dL (ref 5–40)

## 2021-11-09 LAB — COMPREHENSIVE METABOLIC PANEL
ALT: 28 IU/L (ref 0–44)
AST: 29 IU/L (ref 0–40)
Albumin/Globulin Ratio: 2.6 — ABNORMAL HIGH (ref 1.2–2.2)
Albumin: 4.4 g/dL (ref 3.5–4.6)
Alkaline Phosphatase: 61 IU/L (ref 44–121)
BUN/Creatinine Ratio: 10 (ref 10–24)
BUN: 8 mg/dL — ABNORMAL LOW (ref 10–36)
Bilirubin Total: 0.6 mg/dL (ref 0.0–1.2)
CO2: 22 mmol/L (ref 20–29)
Calcium: 8.7 mg/dL (ref 8.6–10.2)
Chloride: 100 mmol/L (ref 96–106)
Creatinine, Ser: 0.79 mg/dL (ref 0.76–1.27)
Globulin, Total: 1.7 g/dL (ref 1.5–4.5)
Glucose: 79 mg/dL (ref 70–99)
Potassium: 4.1 mmol/L (ref 3.5–5.2)
Sodium: 138 mmol/L (ref 134–144)
Total Protein: 6.1 g/dL (ref 6.0–8.5)
eGFR: 83 mL/min/{1.73_m2} (ref >59)

## 2021-11-09 LAB — CBC WITH DIFFERENTIAL/PLATELET
Basophils Absolute: 0.1 10*3/uL (ref 0.0–0.2)
Basos: 1 %
EOS (ABSOLUTE): 0.2 10*3/uL (ref 0.0–0.4)
Eos: 3 %
Hematocrit: 39.2 % (ref 37.5–51.0)
Hemoglobin: 13.4 g/dL (ref 13.0–17.7)
Immature Grans (Abs): 0 10*3/uL (ref 0.0–0.1)
Immature Granulocytes: 1 %
Lymphocytes Absolute: 1.3 10*3/uL (ref 0.7–3.1)
Lymphs: 22 %
MCH: 32.1 pg (ref 26.6–33.0)
MCHC: 34.2 g/dL (ref 31.5–35.7)
MCV: 94 fL (ref 79–97)
Monocytes Absolute: 0.6 10*3/uL (ref 0.1–0.9)
Monocytes: 10 %
Neutrophils Absolute: 3.7 10*3/uL (ref 1.4–7.0)
Neutrophils: 63 %
Platelets: 215 10*3/uL (ref 150–450)
RBC: 4.17 x10E6/uL (ref 4.14–5.80)
RDW: 11.9 % (ref 11.6–15.4)
WBC: 5.8 10*3/uL (ref 3.4–10.8)

## 2021-11-09 LAB — TSH: TSH: 3.01 u[IU]/mL (ref 0.450–4.500)

## 2021-11-12 ENCOUNTER — Encounter: Payer: Self-pay | Admitting: Family Medicine

## 2021-11-20 DIAGNOSIS — I2510 Atherosclerotic heart disease of native coronary artery without angina pectoris: Secondary | ICD-10-CM | POA: Diagnosis not present

## 2021-11-20 DIAGNOSIS — Z87891 Personal history of nicotine dependence: Secondary | ICD-10-CM

## 2021-11-20 DIAGNOSIS — E785 Hyperlipidemia, unspecified: Secondary | ICD-10-CM | POA: Diagnosis not present

## 2021-11-20 DIAGNOSIS — I10 Essential (primary) hypertension: Secondary | ICD-10-CM

## 2021-12-09 ENCOUNTER — Telehealth: Payer: Self-pay | Admitting: Cardiovascular Disease

## 2021-12-09 NOTE — Telephone Encounter (Signed)
Pt c/o medication issue:  1. Name of Medication:  lisinopril (ZESTRIL) 5 MG tablet  2. How are you currently taking this medication (dosage and times per day)?   3. Are you having a reaction (difficulty breathing--STAT)?   4. What is your medication issue?   Payal with [employer] states the patient informed them that he decreased his Lisinopril from taking 1 tablet twice daily to taking 1tablet once daily. She states the patient reported his BP has gone down and he assumes it is unnecessary for him to continue taking it twice daily. Please advise.  Phone#: [phone] (ext#: [phone])

## 2021-12-10 NOTE — Telephone Encounter (Signed)
Called and spoke with patient.   Asked patient how his blood pressure has been since decreasing his lisinopil to once daily. Patient reports that he hasn't checked it today and last checked it last week and he thinks it was 144/63. Reports that on twice daily lisinopril his BP was too low but cannot give me specific numbers.   Advised patient that I will forward this to MD for review and let him know if he wants patient to begin taking it twice a day again.

## 2021-12-12 ENCOUNTER — Observation Stay (HOSPITAL_BASED_OUTPATIENT_CLINIC_OR_DEPARTMENT_OTHER)
Admit: 2021-12-12 | Discharge: 2021-12-12 | Disposition: A | Discharge status: Home / Self Care | Payer: Medicare Other | Attending: Internal Medicine | Admitting: Internal Medicine

## 2021-12-12 ENCOUNTER — Emergency Department: Payer: Medicare Other

## 2021-12-12 ENCOUNTER — Observation Stay: Payer: Medicare Other

## 2021-12-12 ENCOUNTER — Other Ambulatory Visit: Payer: Self-pay

## 2021-12-12 ENCOUNTER — Observation Stay
Admission: EM | Admit: 2021-12-12 | Discharge: 2021-12-14 | Disposition: A | Discharge status: Home Health | Payer: Medicare Other | Attending: Internal Medicine | Admitting: Internal Medicine

## 2021-12-12 DIAGNOSIS — S2232XA Fracture of one rib, left side, initial encounter for closed fracture: Secondary | ICD-10-CM | POA: Diagnosis not present

## 2021-12-12 DIAGNOSIS — R296 Repeated falls: Secondary | ICD-10-CM | POA: Diagnosis not present

## 2021-12-12 DIAGNOSIS — I10 Essential (primary) hypertension: Secondary | ICD-10-CM

## 2021-12-12 DIAGNOSIS — I739 Peripheral vascular disease, unspecified: Secondary | ICD-10-CM | POA: Diagnosis present

## 2021-12-12 DIAGNOSIS — W19XXXA Unspecified fall, initial encounter: Secondary | ICD-10-CM | POA: Diagnosis not present

## 2021-12-12 DIAGNOSIS — D692 Other nonthrombocytopenic purpura: Secondary | ICD-10-CM | POA: Diagnosis present

## 2021-12-12 DIAGNOSIS — M4312 Spondylolisthesis, cervical region: Secondary | ICD-10-CM | POA: Diagnosis not present

## 2021-12-12 DIAGNOSIS — G459 Transient cerebral ischemic attack, unspecified: Secondary | ICD-10-CM | POA: Diagnosis not present

## 2021-12-12 DIAGNOSIS — R410 Disorientation, unspecified: Secondary | ICD-10-CM

## 2021-12-12 DIAGNOSIS — G319 Degenerative disease of nervous system, unspecified: Secondary | ICD-10-CM | POA: Diagnosis not present

## 2021-12-12 DIAGNOSIS — R531 Weakness: Secondary | ICD-10-CM | POA: Insufficient documentation

## 2021-12-12 DIAGNOSIS — Z87891 Personal history of nicotine dependence: Secondary | ICD-10-CM | POA: Diagnosis not present

## 2021-12-12 DIAGNOSIS — Z79899 Other long term (current) drug therapy: Secondary | ICD-10-CM | POA: Insufficient documentation

## 2021-12-12 DIAGNOSIS — R509 Fever, unspecified: Secondary | ICD-10-CM | POA: Diagnosis not present

## 2021-12-12 DIAGNOSIS — R001 Bradycardia, unspecified: Secondary | ICD-10-CM | POA: Diagnosis not present

## 2021-12-12 DIAGNOSIS — I2510 Atherosclerotic heart disease of native coronary artery without angina pectoris: Secondary | ICD-10-CM | POA: Insufficient documentation

## 2021-12-12 DIAGNOSIS — Z7982 Long term (current) use of aspirin: Secondary | ICD-10-CM | POA: Diagnosis not present

## 2021-12-12 DIAGNOSIS — E785 Hyperlipidemia, unspecified: Secondary | ICD-10-CM | POA: Diagnosis present

## 2021-12-12 DIAGNOSIS — R079 Chest pain, unspecified: Secondary | ICD-10-CM | POA: Diagnosis not present

## 2021-12-12 DIAGNOSIS — S0990XA Unspecified injury of head, initial encounter: Secondary | ICD-10-CM | POA: Diagnosis not present

## 2021-12-12 DIAGNOSIS — Z20822 Contact with and (suspected) exposure to covid-19: Secondary | ICD-10-CM | POA: Insufficient documentation

## 2021-12-12 DIAGNOSIS — R2681 Unsteadiness on feet: Secondary | ICD-10-CM | POA: Diagnosis present

## 2021-12-12 DIAGNOSIS — Z9189 Other specified personal risk factors, not elsewhere classified: Secondary | ICD-10-CM

## 2021-12-12 DIAGNOSIS — M1611 Unilateral primary osteoarthritis, right hip: Secondary | ICD-10-CM | POA: Diagnosis not present

## 2021-12-12 DIAGNOSIS — I890 Lymphedema, not elsewhere classified: Secondary | ICD-10-CM

## 2021-12-12 DIAGNOSIS — I700 Atherosclerosis of aorta: Secondary | ICD-10-CM | POA: Diagnosis not present

## 2021-12-12 DIAGNOSIS — M4602 Spinal enthesopathy, cervical region: Secondary | ICD-10-CM | POA: Diagnosis not present

## 2021-12-12 DIAGNOSIS — M25551 Pain in right hip: Secondary | ICD-10-CM | POA: Diagnosis not present

## 2021-12-12 LAB — ECHOCARDIOGRAM COMPLETE
AR max vel: 0.99 cm2
AV Area VTI: 1.05 cm2
AV Area mean vel: 1.03 cm2
AV Mean grad: 8.3 mmHg
AV Peak grad: 14.5 mmHg
Ao pk vel: 1.9 m/s
Area-P 1/2: 2.19 cm2
Height: 69 in
MV VTI: 1.13 cm2
S' Lateral: 2.9 cm
Weight: 2672 oz

## 2021-12-12 LAB — URINE DRUG SCREEN, QUALITATIVE (ARMC ONLY)
Amphetamines, Ur Screen: NOT DETECTED
Barbiturates, Ur Screen: NOT DETECTED
Benzodiazepine, Ur Scrn: NOT DETECTED
Cannabinoid 50 Ng, Ur ~~LOC~~: NOT DETECTED
Cocaine Metabolite,Ur ~~LOC~~: NOT DETECTED
MDMA (Ecstasy)Ur Screen: NOT DETECTED
Methadone Scn, Ur: NOT DETECTED
Opiate, Ur Screen: NOT DETECTED
Phencyclidine (PCP) Ur S: NOT DETECTED
Tricyclic, Ur Screen: NOT DETECTED

## 2021-12-12 LAB — URINALYSIS, ROUTINE W REFLEX MICROSCOPIC
Bilirubin Urine: NEGATIVE
Glucose, UA: NEGATIVE mg/dL
Hgb urine dipstick: NEGATIVE
Ketones, ur: NEGATIVE mg/dL
Leukocytes,Ua: NEGATIVE
Nitrite: NEGATIVE
Protein, ur: NEGATIVE mg/dL
Specific Gravity, Urine: 1.006 (ref 1.005–1.030)
pH: 7 (ref 5.0–8.0)

## 2021-12-12 LAB — CBC
HCT: 37 % — ABNORMAL LOW (ref 39.0–52.0)
Hemoglobin: 12.5 g/dL — ABNORMAL LOW (ref 13.0–17.0)
MCH: 31.5 pg (ref 26.0–34.0)
MCHC: 33.8 g/dL (ref 30.0–36.0)
MCV: 93.2 fL (ref 80.0–100.0)
Platelets: 209 10*3/uL (ref 150–400)
RBC: 3.97 MIL/uL — ABNORMAL LOW (ref 4.22–5.81)
RDW: 12.2 % (ref 11.5–15.5)
WBC: 6.9 10*3/uL (ref 4.0–10.5)
nRBC: 0 % (ref 0.0–0.2)

## 2021-12-12 LAB — TROPONIN I (HIGH SENSITIVITY)
Troponin I (High Sensitivity): 6 ng/L (ref <18)
Troponin I (High Sensitivity): 6 ng/L (ref <18)

## 2021-12-12 LAB — VITAMIN B12: Vitamin B-12: 310 pg/mL (ref 180–914)

## 2021-12-12 LAB — HEPATIC FUNCTION PANEL
ALT: 24 U/L (ref 0–44)
AST: 32 U/L (ref 15–41)
Albumin: 3.9 g/dL (ref 3.5–5.0)
Alkaline Phosphatase: 58 U/L (ref 38–126)
Bilirubin, Direct: 0.2 mg/dL (ref 0.0–0.2)
Indirect Bilirubin: 0.6 mg/dL (ref 0.3–0.9)
Total Bilirubin: 0.8 mg/dL (ref 0.3–1.2)
Total Protein: 6.3 g/dL — ABNORMAL LOW (ref 6.5–8.1)

## 2021-12-12 LAB — BASIC METABOLIC PANEL
Anion gap: 7 (ref 5–15)
BUN: 11 mg/dL (ref 8–23)
CO2: 24 mmol/L (ref 22–32)
Calcium: 9.1 mg/dL (ref 8.9–10.3)
Chloride: 102 mmol/L (ref 98–111)
Creatinine, Ser: 0.69 mg/dL (ref 0.61–1.24)
GFR, Estimated: >60 mL/min (ref >60)
Glucose, Bld: 119 mg/dL — ABNORMAL HIGH (ref 70–99)
Potassium: 4 mmol/L (ref 3.5–5.1)
Sodium: 133 mmol/L — ABNORMAL LOW (ref 135–145)

## 2021-12-12 LAB — SARS CORONAVIRUS 2 BY RT PCR: SARS Coronavirus 2 by RT PCR: NEGATIVE

## 2021-12-12 MED ORDER — HEPARIN SODIUM (PORCINE) 5000 UNIT/ML IJ SOLN
5000.0000 [IU] | Freq: Three times a day (TID) | INTRAMUSCULAR | Status: DC
  Administered 2021-12-12 – 2021-12-13 (×3): 5000 [IU] via SUBCUTANEOUS
  Filled 2021-12-12 (×4): qty 1

## 2021-12-12 MED ORDER — ASPIRIN 81 MG PO TBEC
81.0000 mg | DELAYED_RELEASE_TABLET | Freq: Every day | ORAL | Status: DC
  Administered 2021-12-13 – 2021-12-14 (×2): 81 mg via ORAL
  Filled 2021-12-12 (×2): qty 1

## 2021-12-12 MED ORDER — ACETAMINOPHEN 160 MG/5ML PO SOLN: 650.0000 mg | ORAL | Status: DC | PRN

## 2021-12-12 MED ORDER — ACETAMINOPHEN 500 MG PO TABS
1000.0000 mg | ORAL_TABLET | Freq: Four times a day (QID) | ORAL | Status: DC | PRN
  Administered 2021-12-12: 1000 mg via ORAL
  Filled 2021-12-12: qty 2

## 2021-12-12 MED ORDER — ACETAMINOPHEN 325 MG PO TABS
650.0000 mg | ORAL_TABLET | ORAL | Status: DC | PRN
  Administered 2021-12-13: 650 mg via ORAL
  Filled 2021-12-12: qty 2

## 2021-12-12 MED ORDER — LIDOCAINE 5 % EX PTCH
1.0000 | MEDICATED_PATCH | Freq: Every day | CUTANEOUS | Status: DC | PRN
  Administered 2021-12-13 – 2021-12-14 (×2): 1 via TRANSDERMAL
  Filled 2021-12-12 (×3): qty 1

## 2021-12-12 MED ORDER — LISINOPRIL 5 MG PO TABS: 5.0000 mg | ORAL_TABLET | Freq: Two times a day (BID) | ORAL | Status: DC

## 2021-12-12 MED ORDER — OXYCODONE HCL 5 MG PO TABS: 5.0000 mg | ORAL_TABLET | Freq: Four times a day (QID) | ORAL | Status: DC | PRN

## 2021-12-12 MED ORDER — POLYETHYLENE GLYCOL 3350 17 G PO PACK
17.0000 g | PACK | Freq: Two times a day (BID) | ORAL | Status: DC | PRN
Start: 2021-12-12 — End: 2021-12-14

## 2021-12-12 MED ORDER — ACETAMINOPHEN 650 MG RE SUPP: 650.0000 mg | RECTAL | Status: DC | PRN

## 2021-12-12 MED ORDER — ROSUVASTATIN CALCIUM 10 MG PO TABS
10.0000 mg | ORAL_TABLET | Freq: Every day | ORAL | Status: DC
  Administered 2021-12-12 – 2021-12-14 (×3): 10 mg via ORAL
  Filled 2021-12-12 (×4): qty 1

## 2021-12-12 MED ORDER — ASPIRIN 81 MG PO TBEC
243.0000 mg | DELAYED_RELEASE_TABLET | Freq: Once | ORAL | Status: AC
  Administered 2021-12-12: 243 mg via ORAL
  Filled 2021-12-12: qty 3

## 2021-12-12 MED ORDER — HYDRALAZINE HCL 20 MG/ML IJ SOLN
5.0000 mg | Freq: Four times a day (QID) | INTRAMUSCULAR | Status: DC | PRN
  Administered 2021-12-12: 5 mg via INTRAVENOUS
  Filled 2021-12-12: qty 1

## 2021-12-12 MED ORDER — SENNOSIDES-DOCUSATE SODIUM 8.6-50 MG PO TABS: 1.0000 | ORAL_TABLET | Freq: Every evening | ORAL | Status: DC | PRN

## 2021-12-12 MED ORDER — STROKE: EARLY STAGES OF RECOVERY BOOK: Freq: Once | Status: DC

## 2021-12-12 MED ORDER — OXYCODONE HCL 5 MG PO TABS
2.5000 mg | ORAL_TABLET | Freq: Once | ORAL | Status: AC
  Administered 2021-12-12: 2.5 mg via ORAL
  Filled 2021-12-12: qty 1

## 2021-12-12 MED ORDER — MORPHINE SULFATE (PF) 2 MG/ML IV SOLN: 2.0000 mg | INTRAVENOUS | Status: DC | PRN

## 2021-12-12 MED ORDER — HYDRALAZINE HCL 20 MG/ML IJ SOLN: 5.0000 mg | Freq: Four times a day (QID) | INTRAMUSCULAR | Status: DC | PRN

## 2021-12-12 NOTE — ED Triage Notes (Signed)
Pt brought in by neighbor, states he lives by himself, states she is concerned he has had mulitple falls in the past week. States he has memory issues at baseline, pt states he lost his balance Saturday or Sunday and is having BL hip pain, left rib pain that wraps around to his back since. Pt arrives to the ED with a cane but neighbor reports he does not normally use a cane

## 2021-12-12 NOTE — Hospital Course (Addendum)
[name] is a 88 year old male with history of hypertension, hyperlipidemia, CAD, carotid atherosclerosis, PAD, senile purpura, who presents emergency department from home via neighbor for chief concerns of frequent falls over the last 2 weeks.  Initial vitals in the emergency department showed temperature of 98.4, respiration rate of 17, heart rate 62, blood pressure 166/75, SPO2 100% on room air.  Serum sodium is 133, potassium 4.0, chloride 102, bicarb 24, BUN of 11, serum creatinine of 0.62, GFR greater than 60, nonfasting blood glucose 119, WBC 6.9, hemoglobin 12.5, platelets of 209.  High since troponin was 6. UA was negative for leukocytes and nitrates.  ED treatment: oxycodone 2.5 mg p.o. one-time dose.

## 2021-12-12 NOTE — ED Notes (Signed)
Pt taken for scans 

## 2021-12-12 NOTE — ED Notes (Signed)
Informed RN bed assigned 

## 2021-12-12 NOTE — Assessment & Plan Note (Addendum)
-   Rosuvastatin 10 mg daily resumed - Aspirin 364 mg p.o. one-time dose in the ED - Resumed home aspirin 81 mg daily - Staff message sent to neurology

## 2021-12-12 NOTE — Progress Notes (Signed)
*  PRELIMINARY RESULTS* Echocardiogram 2D Echocardiogram has been performed.  Nicholas Gross 12/12/2021, 2:21 PM

## 2021-12-12 NOTE — Progress Notes (Signed)
PT Cancellation Note  Patient Details Name: Nicholas Gross MRN: [id number] DOB: 10-13-1927   Cancelled Treatment:    Reason Eval/Treat Not Completed: Medical issues which prohibited therapy (CT negative for acute findings, MRI pending. Recent BP 180s systoilic. Will defer evaluation to later date/time once medically ready.)  2:39 PM, 12/12/21 Rosamaria Lints, PT, DPT Physical Therapist - [facility]  [phone] (ASCOM)    [place] [facility] 12/12/2021, 2:39 PM

## 2021-12-12 NOTE — Assessment & Plan Note (Addendum)
Stroke like symptoms - This is a deviation from patient's baseline of walking at least 1 mile per day and 40 push-ups per day - Ordered aspirin loading dose to total 364 mg one-time dose; have resumed aspirin 81 mg daily for 12/13/2021 - Etiology work-up in progress - Check B12, complete echo, MRI of the brain ordered per EDP - TSH has not been ordered due to value on 11/08/2021 was within normal limits - Neurology, staff message sent to Dr. Amada Jupiter - Check A1c in the a.m. - Fall precautions, aspiration precautions - Admit to telemetry medical, observation

## 2021-12-12 NOTE — Assessment & Plan Note (Addendum)
Left anterior seventh rib displaced fracture - Presumed secondary to frequent falls - Lidocaine patch as needed ordered for rib pain - Pain control with acetaminophen 650 mg, p.o./per tube/rectal as needed for mild pain or fever; oxycodone 5 mg every 6 hours as needed for moderate pain, 4 doses ordered; morphine 2 mg IV every 4 hours as needed for breakthrough pain, 2 doses ordered - Incentive spirometry and flutter valve every 2 hours while awake ordered

## 2021-12-12 NOTE — H&P (Addendum)
History and Physical   SADIK PIASCIK ZTI:[id number] DOB: April 05, 1928 DOA: 12/12/2021  PCP: Dorcas Carrow, DO  Outpatient Specialists: Dr. Mariah Milling Patient coming from: home via POV  I have personally briefly reviewed patient's old medical records in [facility] EMR.  Chief Concern: Unsteadiness with ambulation and frequent falls  HPI: [name] is a 88 year old male with history of hypertension, hyperlipidemia, CAD, carotid atherosclerosis, PAD, senile purpura, who presents emergency department from home via neighbor for chief concerns of frequent falls over the last 2 weeks.  Initial vitals in the emergency department showed temperature of 98.4, respiration rate of 17, heart rate 62, blood pressure 166/75, SPO2 100% on room air.  Serum sodium is 133, potassium 4.0, chloride 102, bicarb 24, BUN of 11, serum creatinine of 0.62, GFR greater than 60, nonfasting blood glucose 119, WBC 6.9, hemoglobin 12.5, platelets of 209.  High since troponin was 6. UA was negative for leukocytes and nitrates.  ED treatment: oxycodone 2.5 mg p.o. one-time dose.  At bedside patient is able to tell me his full name, his age, he knows he is in the hospital he is able to identify his neighbor, Gavin Pound and Talbot Grumbling at bedside.  He does not appear to be in acute distress at bedside.  Prior to 2 weeks ago, he walked at least 1 mile per day and did 40 push-ups daily.  He reports weakness over the last 2 weeks.  Neighbor reports that he has had increased falls and that the last couple days she has noticed difficulty speaking, specifically getting his words out.  He reports he is never felt this way before.  He denies trauma to his person.  He does endorse that he fell last weekend onto his left side and its been hurting him.  He denies chest pain, shortness of breath, abdominal pain, dysuria, hematuria, diarrhea.  Social history: He lives at home by himself.  He denies tobacco, EtOH,  recreational drug use.  He is retired and formerly worked in [employer].  ROS: Constitutional: no weight change, no fever ENT/Mouth: no sore throat, no rhinorrhea Eyes: no eye pain, no vision changes Cardiovascular: no chest pain, no dyspnea,  no edema, no palpitations Respiratory: no cough, no sputum, no wheezing Gastrointestinal: no nausea, no vomiting, no diarrhea, no constipation Genitourinary: no urinary incontinence, no dysuria, no hematuria Musculoskeletal: no arthralgias, no myalgias Skin: no skin lesions, no pruritus, Neuro: + weakness, no loss of consciousness, no syncope Psych: no anxiety, no depression, + decrease appetite Heme/Lymph: no bruising, no bleeding  ED Course: Discussed with emergency medicine provider, patient requiring hospitalization for chief concerns of unsteadiness on his feet and frequent falls over the last 2 weeks.  Assessment/Plan  Principal Problem:   Frequent falls Active Problems:   Hypertension   CAD (coronary artery disease)   Hyperlipemia   Lymphedema   PAD (peripheral artery disease) (HCC)   Senile purpura (HCC)   Left rib fracture   At risk for constipation   Assessment and Plan:  * Frequent falls Stroke like symptoms - This is a deviation from patient's baseline of walking at least 1 mile per day and 40 push-ups per day - Ordered aspirin loading dose to total 364 mg one-time dose; have resumed aspirin 81 mg daily for 12/13/2021 - Etiology work-up in progress - Check B12, complete echo, MRI of the brain ordered per EDP - TSH has not been ordered due to value on 11/08/2021 was within normal limits - Neurology,  staff message sent to Dr. Amada Jupiter - Check A1c in the a.m. - Fall precautions, aspiration precautions - Admit to telemetry medical, observation  At risk for constipation - Due to opioid as needed pain medication ordered - Senna docusate 1 tablet daily nightly as needed for mild constipation; GlycoLax twice daily  as needed for moderate constipation  Left rib fracture Left anterior seventh rib displaced fracture - Presumed secondary to frequent falls - Lidocaine patch as needed ordered for rib pain - Pain control with acetaminophen 650 mg, p.o./per tube/rectal as needed for mild pain or fever; oxycodone 5 mg every 6 hours as needed for moderate pain, 4 doses ordered; morphine 2 mg IV every 4 hours as needed for breakthrough pain, 2 doses ordered - Incentive spirometry and flutter valve every 2 hours while awake ordered  PAD (peripheral artery disease) (HCC) - Aspirin resumed  Hyperlipemia - Resumed home rosuvastatin 10 mg daily  CAD (coronary artery disease) - Rosuvastatin 10 mg daily resumed - Aspirin 364 mg p.o. one-time dose in the ED - Resumed home aspirin 81 mg daily - Staff message sent to neurology  Hypertension - Home lisinopril 5 mg p.o. twice daily resumed for 12/13/2021 at 2200 - Hydralazine 5 mg IV every 6 hours as needed for SBP greater than 190, 24 hours ordered  Chart reviewed.   DVT prophylaxis: Heparin 5000 units subcutaneous every 8 hours Code Status: Full code Diet: Heart healthy Family Communication: Updated sister and neighbor at bedside with patient's permission Disposition Plan: Pending clinical course Consults called: Neurology via staff message Admission status: Telemetry medical, observation  Past Medical History:  Diagnosis Date   CAD (coronary artery disease)    History of GI bleed 2008   History of tobacco use    17 pack years, quit around 1970   Hyperlipidemia    Hypertension    Overweight    Past Surgical History:  Procedure Laterality Date   CAROTID ENDARTERECTOMY Left 2010   CATARACT EXTRACTION     cypher stent  09/12/02   s/p cypher stent mid-LAD   TENDON REPAIR  1991   finger and arm   Social History:  reports that he quit smoking about 53 years ago. His smoking use included cigarettes. He has a 17.00 pack-year smoking history. He has never  used smokeless tobacco. He reports that he does not drink alcohol and does not use drugs.  Allergies  Allergen Reactions   Other     Patient states that he is allergic to something that they place in the IV before they work on you   Simvastatin Other (See Comments)    Myalgia    Family History  Problem Relation Age of Onset   Heart disease Father        possibly   Cancer Sister        breast   AAA (abdominal aortic aneurysm) Brother    Cancer Sister        lung   Family history: Family history reviewed and not pertinent  Prior to Admission medications   Medication Sig Start Date End Date Taking? Authorizing Provider  aspirin EC 81 MG tablet Take 1 tablet (81 mg total) by mouth daily. Swallow whole. 11/08/21  Yes Johnson, Megan P, DO  lisinopril (ZESTRIL) 5 MG tablet Take 1 tablet (5 mg total) by mouth 2 (two) times daily. 11/08/21  Yes Johnson, Megan P, DO  Multiple Vitamins-Minerals (PRESERVISION AREDS PO) Take 1 tablet by mouth 2 (two) times daily.  Yes [provider]  rosuvastatin (CRESTOR) 10 MG tablet Take 1 tablet (10 mg total) by mouth daily. 11/08/21  Yes Dorcas Carrow, DO   Physical Exam: Vitals:   12/12/21 1225 12/12/21 1600 12/12/21 1727 12/12/21 1730  BP: (!) 180/83 (!) 176/83 (!) 192/83 (!) 150/70  Pulse: (!) 55 (!) 51  (!) 54  Resp: 14 17  20   Temp:      TempSrc:      SpO2: 100% 100%  98%  Weight:      Height:       Constitutional: appears age-appropriate, frail, NAD, calm, comfortable Eyes: PERRL, lids and conjunctivae normal ENMT: Mucous membranes are moist. Posterior pharynx clear of any exudate or lesions. Age-appropriate dentition. Hearing appropriate Neck: normal, supple, no masses, no thyromegaly Respiratory: clear to auscultation bilaterally, no wheezing, no crackles. Normal respiratory effort. No accessory muscle use.  Cardiovascular: Regular rate and rhythm, no murmurs / rubs / gallops. No extremity edema. 2+ pedal pulses. No carotid  bruits.  Abdomen: no tenderness, no masses palpated, no hepatosplenomegaly. Bowel sounds positive.  Musculoskeletal: no clubbing / cyanosis. No joint deformity upper and lower extremities. Good ROM, no contractures, no atrophy. Normal muscle tone.  Skin: no rashes, lesions, ulcers. No induration Neurologic: Sensation intact. Strength 5/5 in all 4.  Psychiatric: Normal judgment and insight. Alert and oriented x 3. Normal mood.   EKG: independently reviewed, showing sinus bradycardia with rate of 58, QTc 396  x-rays on Admission: I personally reviewed and I agree with radiologist reading as below.  ECHOCARDIOGRAM COMPLETE  Result Date: 12/12/2021    ECHOCARDIOGRAM REPORT   Patient Name:   Nicholas Gross Date of Exam: 12/12/2021 Medical Rec #:  12/14/2021         Height:       69.0 in Accession #:    [id number]        Weight:       167.0 lb Date of Birth:  [birth year]         BSA:          1.914 m Patient Age:    89 years          BP:           180/83 mmHg Patient Gender: M                 HR:           55 bpm. Exam Location:  [facility] Procedure: 2D Echo, Cardiac Doppler and Color Doppler Indications:     TIA G45.9  History:         Patient has prior history of Echocardiogram examinations, most                  recent 08/08/2021. CAD; Risk Factors:Hypertension. Tobacco use.  Sonographer:     08/10/2021 Referring Phys:  Cristela Blue Zaneta Lightcap N Floriene Jeschke Diagnosing Phys: [id number] MD IMPRESSIONS  1. Left ventricular ejection fraction, by estimation, is 60 to 65%. The left ventricle has normal function. The left ventricle has no regional wall motion abnormalities. There is mild left ventricular hypertrophy. Left ventricular diastolic parameters are consistent with Grade I diastolic dysfunction (impaired relaxation).  2. Right ventricular systolic function is normal. The right ventricular size is normal.  3. The mitral valve is normal in structure. No evidence of mitral valve regurgitation. No evidence of mitral stenosis.  Moderate mitral annular calcification.  4. The aortic valve is normal in structure. There is severe calcifcation  of the aortic valve. Aortic valve regurgitation is not visualized. Mild aortic valve stenosis. Aortic valve area, by VTI measures 1.05 cm. Aortic valve mean gradient measures 8.3 mmHg. Aortic valve Vmax measures 1.90 m/s.  5. The inferior vena cava is normal in size with greater than 50% respiratory variability, suggesting right atrial pressure of 3 mmHg. FINDINGS  Left Ventricle: Left ventricular ejection fraction, by estimation, is 60 to 65%. The left ventricle has normal function. The left ventricle has no regional wall motion abnormalities. The left ventricular internal cavity size was normal in size. There is  mild left ventricular hypertrophy. Left ventricular diastolic parameters are consistent with Grade I diastolic dysfunction (impaired relaxation). Right Ventricle: The right ventricular size is normal. No increase in right ventricular wall thickness. Right ventricular systolic function is normal. Left Atrium: Left atrial size was normal in size. Right Atrium: Right atrial size was normal in size. Pericardium: There is no evidence of pericardial effusion. Mitral Valve: The mitral valve is normal in structure. Moderate mitral annular calcification. No evidence of mitral valve regurgitation. No evidence of mitral valve stenosis. MV peak gradient, 10.5 mmHg. The mean mitral valve gradient is 3.0 mmHg. Tricuspid Valve: The tricuspid valve is normal in structure. Tricuspid valve regurgitation is not demonstrated. No evidence of tricuspid stenosis. Aortic Valve: The aortic valve is normal in structure. There is severe calcifcation of the aortic valve. Aortic valve regurgitation is not visualized. Mild aortic stenosis is present. Aortic valve mean gradient measures 8.3 mmHg. Aortic valve peak gradient measures 14.5 mmHg. Aortic valve area, by VTI measures 1.05 cm. Pulmonic Valve: The pulmonic valve  was normal in structure. Pulmonic valve regurgitation is not visualized. No evidence of pulmonic stenosis. Aorta: The aortic root is normal in size and structure. Ascending aorta measurements are within normal limits for age when indexed to body surface area. Venous: The inferior vena cava is normal in size with greater than 50% respiratory variability, suggesting right atrial pressure of 3 mmHg. IAS/Shunts: No atrial level shunt detected by color flow Doppler.  LEFT VENTRICLE PLAX 2D LVIDd:         4.10 cm   Diastology LVIDs:         2.90 cm   LV e' medial:    4.24 cm/s LV PW:         1.10 cm   LV E/e' medial:  20.7 LV IVS:        1.30 cm   LV e' lateral:   6.20 cm/s LVOT diam:     2.00 cm   LV E/e' lateral: 14.2 LV SV:         51 LV SV Index:   27 LVOT Area:     3.14 cm  RIGHT VENTRICLE RV Basal diam:  3.60 cm RV S prime:     9.90 cm/s TAPSE (M-mode): 3.0 cm LEFT ATRIUM             Index        RIGHT ATRIUM           Index LA diam:        3.70 cm 1.93 cm/m   RA Area:     13.70 cm LA Vol (A2C):   90.6 ml 47.35 ml/m  RA Volume:   32.10 ml  16.77 ml/m LA Vol (A4C):   59.2 ml 30.94 ml/m LA Biplane Vol: 78.9 ml 41.23 ml/m  AORTIC VALVE AV Area (Vmax):    0.99 cm AV Area (Vmean):   1.03  cm AV Area (VTI):     1.05 cm AV Vmax:           190.33 cm/s AV Vmean:          132.333 cm/s AV VTI:            0.487 m AV Peak Grad:      14.5 mmHg AV Mean Grad:      8.3 mmHg LVOT Vmax:         60.10 cm/s LVOT Vmean:        43.500 cm/s LVOT VTI:          0.163 m LVOT/AV VTI ratio: 0.33  AORTA Ao Root diam: 3.47 cm MITRAL VALVE                TRICUSPID VALVE MV Area (PHT): 2.19 cm     TR Peak grad:   25.2 mmHg MV Area VTI:   1.13 cm     TR Vmax:        251.00 cm/s MV Peak grad:  10.5 mmHg MV Mean grad:  3.0 mmHg     SHUNTS MV Vmax:       1.62 m/s     Systemic VTI:  0.16 m MV Vmean:      84.4 cm/s    Systemic Diam: 2.00 cm MV Decel Time: 347 msec MV E velocity: 87.80 cm/s MV A velocity: 132.00 cm/s MV E/A ratio:  0.67 Julien Nordmann MD Electronically signed by Julien Nordmann MD Signature Date/Time: 12/12/2021/4:02:23 PM    Final    CT Chest Wo Contrast  Result Date: 12/12/2021 CLINICAL DATA:  Post fall with LEFT chest pain. EXAM: CT CHEST WITHOUT CONTRAST TECHNIQUE: Multidetector CT imaging of the chest was performed following the standard protocol without IV contrast. RADIATION DOSE REDUCTION: This exam was performed according to the departmental dose-optimization program which includes automated exposure control, adjustment of the mA and/or kV according to patient size and/or use of iterative reconstruction technique. COMPARISON:  Rib series from December 12, 2021. FINDINGS: Cardiovascular: Calcified aortic atherosclerosis. 4.6 cm transverse caliber of the ascending thoracic aorta. 4.4 cm in the coronal plane calcifications of the aortic valve. Calcifications of the mitral valve. Three-vessel coronary artery disease. No pericardial effusion or sign of pericardial thickening. Central pulmonary vasculature is of normal caliber. Limited assessment of cardiovascular structures given lack of intravenous contrast. Mediastinum/Nodes: No thoracic inlet, axillary, mediastinal or hilar adenopathy. Esophagus grossly normal. Lungs/Pleura: No consolidation or sign of pleural effusion. No pneumothorax. Mild basilar atelectasis. Subtle ground-glass in the LEFT chest with bandlike appearance may reflect mild pneumonitis or component of atelectasis. Airways are patent. Small RIGHT upper lobe pulmonary nodule (image 67/3) 4 mm. Pleural and parenchymal scarring at the apices. Upper Abdomen: Incidental imaging of upper abdominal contents without acute process related to imaged portions the liver, gallbladder, pancreas, spleen, adrenal glands, kidneys, stomach, small and large bowel. There are calcifications within the jejunal mesentery which are incompletely imaged. Concomitant nodal enlargement may be present up to 12 mm in the jejunal mesentery in there  is some mild stranding as well. No retroperitoneal adenopathy in the upper abdomen. Calcified aortic atherosclerosis tracking into the abdominal aorta. Musculoskeletal: No destructive bone process. Spinal degenerative changes. Osteopenia with moderate degenerative changes of the spine. 40% loss of height of the L1 vertebral body. IMPRESSION: 1. Subtle ground-glass in the LEFT chest with bandlike appearance may reflect mild pneumonitis or component of atelectasis. 2. 4 mm right solid pulmonary nodule within the  upper lobe. If patient is low risk for malignancy, no routine follow-up imaging is recommended; if patient is high risk for malignancy, a non-contrast Chest CT at 12 months is optional. If performed and the nodule is stable at 12 months, no further follow-up is recommended. These guidelines do not apply to immunocompromised patients and patients with cancer. Follow up in patients with significant comorbidities as clinically warranted. For lung cancer screening, adhere to Lung-RADS guidelines. Reference: Radiology. 2017; 284(1):228-43. 3. There are calcifications within the jejunal mesentery which are incompletely imaged. Concomitant nodal enlargement may be present up to 12 mm in the jejunal mesentery in there is some mild stranding as well. Findings are nonspecific but can be seen in the setting of sclerosing mesenteritis, less likely lymphoproliferative disorder post treatment. Consider comparison with prior imaging or short interval follow-up with abdominal imaging. This does not appear to represent an acute finding. 4. Three-vessel coronary artery disease. 5. 4.6 cm transverse caliber, 4.4 cm in the coronal plane of the ascending thoracic aorta. Could consider the following, recommend annual imaging followup by CTA or MRA. This recommendation follows 2010 ACCF/AHA/AATS/ACR/ASA/SCA/SCAI/SIR/STS/SVM Guidelines for the Diagnosis and Management of Patients with Thoracic Aortic Disease. Circulation. 2010; 121:  G269-S854. Aortic aneurysm NOS (ICD10-I71.9) also, given the calcifications of the aortic valve and concomitant aneurysmal change in the thoracic aorta could consider aortic stenosis. Correlate clinically. 6. Age-indeterminate compression fracture at L1 is without surrounding stranding. Correlate with point tenderness in this area. 7. Aortic atherosclerosis. Aortic Atherosclerosis (ICD10-I70.0). Electronically Signed   By: Donzetta Kohut M.D.   On: 12/12/2021 13:09   CT Hip Right Wo Contrast  Result Date: 12/12/2021 CLINICAL DATA:  Hip pain, stress fracture suspected, neg xray EXAM: CT OF THE RIGHT HIP WITHOUT CONTRAST TECHNIQUE: Multidetector CT imaging of the right hip was performed according to the standard protocol. Multiplanar CT image reconstructions were also generated. RADIATION DOSE REDUCTION: This exam was performed according to the departmental dose-optimization program which includes automated exposure control, adjustment of the mA and/or kV according to patient size and/or use of iterative reconstruction technique. COMPARISON:  Hip radiograph 12/12/2020 FINDINGS: Bones/Joint/Cartilage There is no evidence of acute fracture. Alignment is normal. There is moderate right hip osteoarthritis. Ligaments Suboptimally assessed by CT. Muscles and Tendons Right gluteus minimus muscle atrophy. No acute myotendinous abnormality by CT. Soft tissues No focal fluid collection. No lymphadenopathy. Enlarged prostate gland with distension of the urinary bladder. Small right fat containing inguinal hernia. IMPRESSION: No evidence of acute right hip fracture. Moderate right hip osteoarthritis. Electronically Signed   By: Caprice Renshaw M.D.   On: 12/12/2021 13:03   CT HEAD WO CONTRAST ( )  Result Date: 12/12/2021 CLINICAL DATA:  Multiple falls.  Minor head trauma EXAM: CT HEAD WITHOUT CONTRAST CT CERVICAL SPINE WITHOUT CONTRAST TECHNIQUE: Multidetector CT imaging of the head and cervical spine was performed  following the standard protocol without intravenous contrast. Multiplanar CT image reconstructions of the cervical spine were also generated. RADIATION DOSE REDUCTION: This exam was performed according to the departmental dose-optimization program which includes automated exposure control, adjustment of the mA and/or kV according to patient size and/or use of iterative reconstruction technique. COMPARISON:  None Available. FINDINGS: CT HEAD FINDINGS Brain: No evidence of acute infarction, hemorrhage, hydrocephalus, extra-axial collection or mass lesion/mass effect. Mild generalized atrophy. Vascular: Negative for hyperdense vessel Skull: Negative for skull lesion or fracture Sinuses/Orbits: Mild mucosal edema paranasal sinuses. No orbital lesion. Other: None CT CERVICAL SPINE FINDINGS Alignment: 3 mm anterolisthesis C4-5  Skull base and vertebrae: Negative for fracture or mass. Soft tissues and spinal canal: Negative for mass or soft tissue swelling Disc levels: Multilevel disc degeneration and facet degeneration. Foraminal narrowing bilaterally due to spurring at C4-5, C5-6, C6-7 Upper chest: Lung apices clear Other: None IMPRESSION: 1. Negative CT head 2. Negative for cervical spine fracture. Electronically Signed   By: Marlan Palau M.D.   On: 12/12/2021 10:54   CT Cervical Spine Wo Contrast  Result Date: 12/12/2021 CLINICAL DATA:  Multiple falls.  Minor head trauma EXAM: CT HEAD WITHOUT CONTRAST CT CERVICAL SPINE WITHOUT CONTRAST TECHNIQUE: Multidetector CT imaging of the head and cervical spine was performed following the standard protocol without intravenous contrast. Multiplanar CT image reconstructions of the cervical spine were also generated. RADIATION DOSE REDUCTION: This exam was performed according to the departmental dose-optimization program which includes automated exposure control, adjustment of the mA and/or kV according to patient size and/or use of iterative reconstruction technique.  COMPARISON:  None Available. FINDINGS: CT HEAD FINDINGS Brain: No evidence of acute infarction, hemorrhage, hydrocephalus, extra-axial collection or mass lesion/mass effect. Mild generalized atrophy. Vascular: Negative for hyperdense vessel Skull: Negative for skull lesion or fracture Sinuses/Orbits: Mild mucosal edema paranasal sinuses. No orbital lesion. Other: None CT CERVICAL SPINE FINDINGS Alignment: 3 mm anterolisthesis C4-5 Skull base and vertebrae: Negative for fracture or mass. Soft tissues and spinal canal: Negative for mass or soft tissue swelling Disc levels: Multilevel disc degeneration and facet degeneration. Foraminal narrowing bilaterally due to spurring at C4-5, C5-6, C6-7 Upper chest: Lung apices clear Other: None IMPRESSION: 1. Negative CT head 2. Negative for cervical spine fracture. Electronically Signed   By: Marlan Palau M.D.   On: 12/12/2021 10:54   DG Hip Unilat W or Wo Pelvis 2-3 Views Right  Result Date: 12/12/2021 CLINICAL DATA:  Right hip pain after multiple recent falls. EXAM: DG HIP (WITH OR WITHOUT PELVIS) 2-3V RIGHT COMPARISON:  None Available. FINDINGS: There is no evidence of hip fracture or dislocation. There is no evidence of arthropathy or other focal bone abnormality. Osteopenia. IMPRESSION: Negative. Electronically Signed   By: Obie Dredge M.D.   On: 12/12/2021 10:50   DG Ribs Unilateral W/Chest Left  Result Date: 12/12/2021 CLINICAL DATA:  Left-sided rib pain after multiple recent falls. EXAM: LEFT RIBS AND CHEST - 3+ VIEW COMPARISON:  Chest x-ray dated August 16, 2008. FINDINGS: Subacute appearing minimally displaced fracture of the left anterior seventh rib. No additional rib fracture. There is no evidence of pneumothorax or pleural effusion. Both lungs are clear. Heart size and mediastinal contours are within normal limits. IMPRESSION: 1. Subacute appearing minimally displaced fracture of the left anterior seventh rib. 2. No acute cardiopulmonary disease.  Electronically Signed   By: Obie Dredge M.D.   On: 12/12/2021 10:45    Labs on Admission: I have personally reviewed following labs  CBC: Recent Labs  Lab 12/12/21 0929  WBC 6.9  HGB 12.5*  HCT 37.0*  MCV 93.2  PLT 209   Basic Metabolic Panel: Recent Labs  Lab 12/12/21 0929  NA 133*  K 4.0  CL 102  CO2 24  GLUCOSE 119*  BUN 11  CREATININE 0.69  CALCIUM 9.1   GFR: Estimated Creatinine Clearance: 58.9 mL/min (by C-G formula based on SCr of 0.69 mg/dL).  Urine analysis:    Component Value Date/Time   COLORURINE YELLOW (A) 12/12/2021 1114   APPEARANCEUR CLEAR (A) 12/12/2021 1114   APPEARANCEUR Clear 11/08/2021 1007   LABSPEC 1.006 12/12/2021  1114   PHURINE 7.0 12/12/2021 1114   GLUCOSEU NEGATIVE 12/12/2021 1114   HGBUR NEGATIVE 12/12/2021 1114   BILIRUBINUR NEGATIVE 12/12/2021 1114   BILIRUBINUR Negative 11/08/2021 1007   KETONESUR NEGATIVE 12/12/2021 1114   PROTEINUR NEGATIVE 12/12/2021 1114   NITRITE NEGATIVE 12/12/2021 1114   LEUKOCYTESUR NEGATIVE 12/12/2021 1114   Dr. Sedalia Mutaox [facility]  If 7PM-7AM, please contact overnight-coverage provider If 7AM-7PM, please contact day coverage provider [email or web address]  12/12/2021, 5:45 PM

## 2021-12-12 NOTE — Progress Notes (Signed)
Admission profile updated. ?

## 2021-12-12 NOTE — Assessment & Plan Note (Signed)
-   Resumed home rosuvastatin 10 mg daily ?

## 2021-12-13 DIAGNOSIS — R296 Repeated falls: Secondary | ICD-10-CM | POA: Diagnosis not present

## 2021-12-13 DIAGNOSIS — I6529 Occlusion and stenosis of unspecified carotid artery: Secondary | ICD-10-CM | POA: Diagnosis not present

## 2021-12-13 DIAGNOSIS — H3530 Unspecified macular degeneration: Secondary | ICD-10-CM | POA: Diagnosis not present

## 2021-12-13 DIAGNOSIS — S2232XA Fracture of one rib, left side, initial encounter for closed fracture: Secondary | ICD-10-CM | POA: Diagnosis not present

## 2021-12-13 LAB — HEMOGLOBIN A1C
Hgb A1c MFr Bld: 5.1 % (ref 4.8–5.6)
Mean Plasma Glucose: 99.67 mg/dL

## 2021-12-13 LAB — LIPID PANEL
Cholesterol: 105 mg/dL (ref 0–200)
HDL: 54 mg/dL (ref >40)
LDL Cholesterol: 41 mg/dL (ref 0–99)
Total CHOL/HDL Ratio: 1.9 RATIO
Triglycerides: 51 mg/dL (ref <150)
VLDL: 10 mg/dL (ref 0–40)

## 2021-12-13 LAB — AMMONIA: Ammonia: 32 umol/L (ref 9–35)

## 2021-12-13 MED ORDER — LISINOPRIL 5 MG PO TABS
5.0000 mg | ORAL_TABLET | Freq: Two times a day (BID) | ORAL | Status: DC
  Administered 2021-12-13 – 2021-12-14 (×3): 5 mg via ORAL
  Filled 2021-12-13 (×3): qty 1

## 2021-12-13 MED ORDER — VITAMIN B-12 1000 MCG PO TABS
1000.0000 ug | ORAL_TABLET | Freq: Every day | ORAL | Status: DC
  Administered 2021-12-13 – 2021-12-14 (×2): 1000 ug via ORAL
  Filled 2021-12-13 (×2): qty 1

## 2021-12-13 MED ORDER — VALACYCLOVIR HCL 500 MG PO TABS
500.0000 mg | ORAL_TABLET | Freq: Two times a day (BID) | ORAL | Status: DC
Start: 2021-12-13 — End: 2021-12-14
  Administered 2021-12-13 – 2021-12-14 (×2): 500 mg via ORAL
  Filled 2021-12-13 (×2): qty 1

## 2021-12-13 MED ORDER — HYDRALAZINE HCL 20 MG/ML IJ SOLN: 10.0000 mg | Freq: Four times a day (QID) | INTRAMUSCULAR | Status: AC | PRN

## 2021-12-13 NOTE — TOC Progression Note (Signed)
Transition of Care [facility]) - Progression Note    Patient Details  Name: Nicholas Gross MRN: [id number] Date of Birth: Nov 15, 1927  Transition [facility] Care [facility]) CM/SW Contact  Marlowe Sax, RN Phone Number: 12/13/2021, 8:54 AM  Clinical Narrative:    Patient is from home alone, his neighbor brought him in after he had several falls and is feeling weak, he has memory issues at baseline, is otherwise independent generally, he came in with a cane but doe snot usually use one, he normally walks more than a mile a day and does 40 pushups daily, PT to eval,  and make recommendations, MRI is pending, TOC to follow and assist with DC planning, May need [facility] services   Expected Discharge Plan: Home w Home Health Services Barriers to Discharge: Continued Medical Work up  Expected Discharge Plan and Services Expected Discharge Plan: Home w Home Health Services   Discharge Planning Services: CM Consult   Living arrangements for the past 2 months: Single Family Home                                       Social Determinants of Health (SDOH) Interventions    Readmission Risk Interventions     No data to display

## 2021-12-13 NOTE — Progress Notes (Signed)
Talked with the patient  He lives alone His neighbor and sister help him He has a cane but is agreeable to a RW, [facility] to deliver to the bedside [facility] is checking to see if they can accept the patient For [facility] SW, PT an OT

## 2021-12-13 NOTE — TOC Progression Note (Addendum)
Transition of Care [facility]) - Progression Note    Patient Details  Name: Nicholas Gross MRN: [id number] Date of Birth: [birth year]  Transition of Care [facility]) CM/SW Contact  Marlowe Sax, RN Phone Number: 12/13/2021, 2:55 PM  Clinical Narrative:     [facility] is not able to take the patient, Looking for other options Requested Meg with [facility] to accept him, I requested Elnita Maxwell with [facility] and Adelina Mings with [facility] to accept him They will check and let me know Kandee Keen with [facility] said they are pushed out too far with PT [facility] said they do not have staffing  Expected Discharge Plan: Home w Home Health Services Barriers to Discharge: Continued Medical Work up  Expected Discharge Plan and Services Expected Discharge Plan: Home w Home Health Services   Discharge Planning Services: CM Consult   Living arrangements for the past 2 months: Single Family Home                 DME Arranged: Dan Humphreys rolling DME Agency: [facility] Date DME Agency Contacted: 12/20/21 Time DME Agency Contacted: [phone] Representative spoke with at DME Agency: Bjorn Loser HH Arranged: PT, OT, Social Work [facility] Agency: [facility] Date HH Agency Contacted: 12/13/21 Time HH Agency Contacted: [phone] Representative spoke with at [facility] Agency: Barbara Cower   Social Determinants of Health (SDOH) Interventions    Readmission Risk Interventions     No data to display

## 2021-12-13 NOTE — Progress Notes (Signed)
PROGRESS NOTE    DANTHONY STONEBREAKER  AYT:[id number] DOB: [birth year] DOA: 12/12/2021 PCP: Dorcas Carrow, DO    Brief Narrative:  [name] is a 88 year old male with history of hypertension, hyperlipidemia, CAD, carotid atherosclerosis, PAD, senile purpura, who presents emergency department from home via neighbor for chief concerns of frequent falls over the last 2 weeks  6/23 neurology consulted pending.  MRI negative for acute abnormality  Consultants:  Neurology  Procedures:   Antimicrobials:      Subjective: C/o Lt rib pain. No sob or cp  Objective: Vitals:   12/13/21 1053 12/13/21 1057 12/13/21 1101 12/13/21 1127  BP: (!) 173/70 (!) 176/76 (!) 160/83 (!) 165/74  Pulse: (!) 53 65 66 60  Resp:    16  Temp:    97.8 F (36.6 C)  TempSrc:    Oral  SpO2:    100%  Weight:      Height:        Intake/Output Summary (Last 24 hours) at 12/13/2021 1500 Last data filed at 12/13/2021 1344 Gross per 24 hour  Intake 480 ml  Output 550 ml  Net -70 ml   Filed Weights   12/12/21 0914  Weight: 75.8 kg    Examination: Calm, NAD Cta no w/r Reg s1/s2 no gallop Soft benign +bs No edema Awake and alert, grossly intact, 5 out of 5 strength x4 Mood and affect appropriate in current setting     Data Reviewed: I have personally reviewed following labs and imaging studies  CBC: Recent Labs  Lab 12/12/21 0929  WBC 6.9  HGB 12.5*  HCT 37.0*  MCV 93.2  PLT 209   Basic Metabolic Panel: Recent Labs  Lab 12/12/21 0929  NA 133*  K 4.0  CL 102  CO2 24  GLUCOSE 119*  BUN 11  CREATININE 0.69  CALCIUM 9.1   GFR: Estimated Creatinine Clearance: 58.9 mL/min (by C-G formula based on SCr of 0.69 mg/dL). Liver Function Tests: Recent Labs  Lab 12/12/21 0929  AST 32  ALT 24  ALKPHOS 58  BILITOT 0.8  PROT 6.3*  ALBUMIN 3.9   No results for input(s): "LIPASE", "AMYLASE" in the last 168 hours. No results for input(s): "AMMONIA" in the last 168  hours. Coagulation Profile: No results for input(s): "INR", "PROTIME" in the last 168 hours. Cardiac Enzymes: No results for input(s): "CKTOTAL", "CKMB", "CKMBINDEX", "TROPONINI" in the last 168 hours. BNP (last 3 results) No results for input(s): "PROBNP" in the last 8760 hours. HbA1C: Recent Labs    12/13/21 0428  HGBA1C 5.1   CBG: No results for input(s): "GLUCAP" in the last 168 hours. Lipid Profile: Recent Labs    12/13/21 0428  CHOL 105  HDL 54  LDLCALC 41  TRIG 51  CHOLHDL 1.9   Thyroid Function Tests: No results for input(s): "TSH", "T4TOTAL", "FREET4", "T3FREE", "THYROIDAB" in the last 72 hours. Anemia Panel: Recent Labs    12/12/21 1900  VITAMINB12 310   Sepsis Labs: No results for input(s): "PROCALCITON", "LATICACIDVEN" in the last 168 hours.  Recent Results (from the past 240 hour(s))  SARS Coronavirus 2 by RT PCR (hospital order, performed in [facility] hospital lab) *cepheid single result test* Anterior Nasal Swab     Status: None   Collection Time: 12/12/21  1:02 PM   Specimen: Anterior Nasal Swab  Result Value Ref Range Status   SARS Coronavirus 2 by RT PCR NEGATIVE NEGATIVE Final    Comment: (NOTE) SARS-CoV-2 target nucleic acids are  NOT DETECTED.  The SARS-CoV-2 RNA is generally detectable in upper and lower respiratory specimens during the acute phase of infection. The lowest concentration of SARS-CoV-2 viral copies this assay can detect is 250 copies / mL. A negative result does not preclude SARS-CoV-2 infection and should not be used as the sole basis for treatment or other patient management decisions.  A negative result may occur with improper specimen collection / handling, submission of specimen other than nasopharyngeal swab, presence of viral mutation(s) within the areas targeted by this assay, and inadequate number of viral copies (<250 copies / mL). A negative result must be combined with clinical observations, patient history, and  epidemiological information.  Fact Sheet for Patients:   RoadLapTop.co.za  Fact Sheet for Healthcare Providers: http://kim-miller.com/  This test is not yet approved or  cleared by the [place] FDA and has been authorized for detection and/or diagnosis of SARS-CoV-2 by FDA under an Emergency Use Authorization (EUA).  This EUA will remain in effect (meaning this test can be used) for the duration of the COVID-19 declaration under Section 564(b)(1) of the Act, 21 U.S.C. section 360bbb-3(b)(1), unless the authorization is terminated or revoked sooner.  Performed at [facility], [address] [place], [place] [address]          Radiology Studies: MR BRAIN WO CONTRAST  Result Date: 12/12/2021 CLINICAL DATA:  Altered mental status EXAM: MRI HEAD WITHOUT CONTRAST TECHNIQUE: Multiplanar, multiecho pulse sequences of the brain and surrounding structures were obtained without intravenous contrast. COMPARISON:  None Available. FINDINGS: Brain: No acute infarct, mass effect or extra-axial collection. No acute or chronic hemorrhage. There is multifocal hyperintense T2-weighted signal within the white matter. Parenchymal volume and CSF spaces are normal. The midline structures are normal. Vascular: Major flow voids are preserved. Skull and upper cervical spine: Normal calvarium and skull base. Visualized upper cervical spine and soft tissues are normal. Sinuses/Orbits:No paranasal sinus fluid levels or advanced mucosal thickening. No mastoid or middle ear effusion. Normal orbits. IMPRESSION: 1. No acute intracranial abnormality. 2. Findings of chronic small vessel ischemia. Electronically Signed   By: Deatra Robinson M.D.   On: 12/12/2021 21:41   ECHOCARDIOGRAM COMPLETE  Result Date: 12/12/2021    ECHOCARDIOGRAM REPORT   Patient Name:   Nicholas Gross Date of Exam: 12/12/2021 Medical Rec #:  [id number]         Height:       69.0 in  Accession #:    [id number]        Weight:       167.0 lb Date of Birth:  07/15/1927         BSA:          1.914 m Patient Age:    89 years          BP:           180/83 mmHg Patient Gender: M                 HR:           55 bpm. Exam Location:  [facility] Procedure: 2D Echo, Cardiac Doppler and Color Doppler Indications:     TIA G45.9  History:         Patient has prior history of Echocardiogram examinations, most                  recent 08/08/2021. CAD; Risk Factors:Hypertension. Tobacco use.  Sonographer:     Cristela Blue Referring Phys:  [id number] AMY N COX Diagnosing Phys: Julien Nordmann MD IMPRESSIONS  1. Left ventricular ejection fraction, by estimation, is 60 to 65%. The left ventricle has normal function. The left ventricle has no regional wall motion abnormalities. There is mild left ventricular hypertrophy. Left ventricular diastolic parameters are consistent with Grade I diastolic dysfunction (impaired relaxation).  2. Right ventricular systolic function is normal. The right ventricular size is normal.  3. The mitral valve is normal in structure. No evidence of mitral valve regurgitation. No evidence of mitral stenosis. Moderate mitral annular calcification.  4. The aortic valve is normal in structure. There is severe calcifcation of the aortic valve. Aortic valve regurgitation is not visualized. Mild aortic valve stenosis. Aortic valve area, by VTI measures 1.05 cm. Aortic valve mean gradient measures 8.3 mmHg. Aortic valve Vmax measures 1.90 m/s.  5. The inferior vena cava is normal in size with greater than 50% respiratory variability, suggesting right atrial pressure of 3 mmHg. FINDINGS  Left Ventricle: Left ventricular ejection fraction, by estimation, is 60 to 65%. The left ventricle has normal function. The left ventricle has no regional wall motion abnormalities. The left ventricular internal cavity size was normal in size. There is  mild left ventricular hypertrophy. Left ventricular diastolic parameters  are consistent with Grade I diastolic dysfunction (impaired relaxation). Right Ventricle: The right ventricular size is normal. No increase in right ventricular wall thickness. Right ventricular systolic function is normal. Left Atrium: Left atrial size was normal in size. Right Atrium: Right atrial size was normal in size. Pericardium: There is no evidence of pericardial effusion. Mitral Valve: The mitral valve is normal in structure. Moderate mitral annular calcification. No evidence of mitral valve regurgitation. No evidence of mitral valve stenosis. MV peak gradient, 10.5 mmHg. The mean mitral valve gradient is 3.0 mmHg. Tricuspid Valve: The tricuspid valve is normal in structure. Tricuspid valve regurgitation is not demonstrated. No evidence of tricuspid stenosis. Aortic Valve: The aortic valve is normal in structure. There is severe calcifcation of the aortic valve. Aortic valve regurgitation is not visualized. Mild aortic stenosis is present. Aortic valve mean gradient measures 8.3 mmHg. Aortic valve peak gradient measures 14.5 mmHg. Aortic valve area, by VTI measures 1.05 cm. Pulmonic Valve: The pulmonic valve was normal in structure. Pulmonic valve regurgitation is not visualized. No evidence of pulmonic stenosis. Aorta: The aortic root is normal in size and structure. Ascending aorta measurements are within normal limits for age when indexed to body surface area. Venous: The inferior vena cava is normal in size with greater than 50% respiratory variability, suggesting right atrial pressure of 3 mmHg. IAS/Shunts: No atrial level shunt detected by color flow Doppler.  LEFT VENTRICLE PLAX 2D LVIDd:         4.10 cm   Diastology LVIDs:         2.90 cm   LV e' medial:    4.24 cm/s LV PW:         1.10 cm   LV E/e' medial:  20.7 LV IVS:        1.30 cm   LV e' lateral:   6.20 cm/s LVOT diam:     2.00 cm   LV E/e' lateral: 14.2 LV SV:         51 LV SV Index:   27 LVOT Area:     3.14 cm  RIGHT VENTRICLE RV Basal  diam:  3.60 cm RV S prime:     9.90 cm/s TAPSE (M-mode): 3.0 cm LEFT ATRIUM  Index        RIGHT ATRIUM           Index LA diam:        3.70 cm 1.93 cm/m   RA Area:     13.70 cm LA Vol (A2C):   90.6 ml 47.35 ml/m  RA Volume:   32.10 ml  16.77 ml/m LA Vol (A4C):   59.2 ml 30.94 ml/m LA Biplane Vol: 78.9 ml 41.23 ml/m  AORTIC VALVE AV Area (Vmax):    0.99 cm AV Area (Vmean):   1.03 cm AV Area (VTI):     1.05 cm AV Vmax:           190.33 cm/s AV Vmean:          132.333 cm/s AV VTI:            0.487 m AV Peak Grad:      14.5 mmHg AV Mean Grad:      8.3 mmHg LVOT Vmax:         60.10 cm/s LVOT Vmean:        43.500 cm/s LVOT VTI:          0.163 m LVOT/AV VTI ratio: 0.33  AORTA Ao Root diam: 3.47 cm MITRAL VALVE                TRICUSPID VALVE MV Area (PHT): 2.19 cm     TR Peak grad:   25.2 mmHg MV Area VTI:   1.13 cm     TR Vmax:        251.00 cm/s MV Peak grad:  10.5 mmHg MV Mean grad:  3.0 mmHg     SHUNTS MV Vmax:       1.62 m/s     Systemic VTI:  0.16 m MV Vmean:      84.4 cm/s    Systemic Diam: 2.00 cm MV Decel Time: 347 msec MV E velocity: 87.80 cm/s MV A velocity: 132.00 cm/s MV E/A ratio:  0.67 Julien Nordmann MD Electronically signed by Julien Nordmann MD Signature Date/Time: 12/12/2021/4:02:23 PM    Final    CT Chest Wo Contrast  Result Date: 12/12/2021 CLINICAL DATA:  Post fall with LEFT chest pain. EXAM: CT CHEST WITHOUT CONTRAST TECHNIQUE: Multidetector CT imaging of the chest was performed following the standard protocol without IV contrast. RADIATION DOSE REDUCTION: This exam was performed according to the departmental dose-optimization program which includes automated exposure control, adjustment of the mA and/or kV according to patient size and/or use of iterative reconstruction technique. COMPARISON:  Rib series from December 12, 2021. FINDINGS: Cardiovascular: Calcified aortic atherosclerosis. 4.6 cm transverse caliber of the ascending thoracic aorta. 4.4 cm in the coronal plane  calcifications of the aortic valve. Calcifications of the mitral valve. Three-vessel coronary artery disease. No pericardial effusion or sign of pericardial thickening. Central pulmonary vasculature is of normal caliber. Limited assessment of cardiovascular structures given lack of intravenous contrast. Mediastinum/Nodes: No thoracic inlet, axillary, mediastinal or hilar adenopathy. Esophagus grossly normal. Lungs/Pleura: No consolidation or sign of pleural effusion. No pneumothorax. Mild basilar atelectasis. Subtle ground-glass in the LEFT chest with bandlike appearance may reflect mild pneumonitis or component of atelectasis. Airways are patent. Small RIGHT upper lobe pulmonary nodule (image 67/3) 4 mm. Pleural and parenchymal scarring at the apices. Upper Abdomen: Incidental imaging of upper abdominal contents without acute process related to imaged portions the liver, gallbladder, pancreas, spleen, adrenal glands, kidneys, stomach, small and large bowel. There are calcifications within the jejunal  mesentery which are incompletely imaged. Concomitant nodal enlargement may be present up to 12 mm in the jejunal mesentery in there is some mild stranding as well. No retroperitoneal adenopathy in the upper abdomen. Calcified aortic atherosclerosis tracking into the abdominal aorta. Musculoskeletal: No destructive bone process. Spinal degenerative changes. Osteopenia with moderate degenerative changes of the spine. 40% loss of height of the L1 vertebral body. IMPRESSION: 1. Subtle ground-glass in the LEFT chest with bandlike appearance may reflect mild pneumonitis or component of atelectasis. 2. 4 mm right solid pulmonary nodule within the upper lobe. If patient is low risk for malignancy, no routine follow-up imaging is recommended; if patient is high risk for malignancy, a non-contrast Chest CT at 12 months is optional. If performed and the nodule is stable at 12 months, no further follow-up is recommended. These  guidelines do not apply to immunocompromised patients and patients with cancer. Follow up in patients with significant comorbidities as clinically warranted. For lung cancer screening, adhere to Lung-RADS guidelines. Reference: Radiology. 2017; 284(1):228-43. 3. There are calcifications within the jejunal mesentery which are incompletely imaged. Concomitant nodal enlargement may be present up to 12 mm in the jejunal mesentery in there is some mild stranding as well. Findings are nonspecific but can be seen in the setting of sclerosing mesenteritis, less likely lymphoproliferative disorder post treatment. Consider comparison with prior imaging or short interval follow-up with abdominal imaging. This does not appear to represent an acute finding. 4. Three-vessel coronary artery disease. 5. 4.6 cm transverse caliber, 4.4 cm in the coronal plane of the ascending thoracic aorta. Could consider the following, recommend annual imaging followup by CTA or MRA. This recommendation follows 2010 ACCF/AHA/AATS/ACR/ASA/SCA/SCAI/SIR/STS/SVM Guidelines for the Diagnosis and Management of Patients with Thoracic Aortic Disease. Circulation. 2010; 121: Z610-R604. Aortic aneurysm NOS (ICD10-I71.9) also, given the calcifications of the aortic valve and concomitant aneurysmal change in the thoracic aorta could consider aortic stenosis. Correlate clinically. 6. Age-indeterminate compression fracture at L1 is without surrounding stranding. Correlate with point tenderness in this area. 7. Aortic atherosclerosis. Aortic Atherosclerosis (ICD10-I70.0). Electronically Signed   By: Donzetta Kohut M.D.   On: 12/12/2021 13:09   CT Hip Right Wo Contrast  Result Date: 12/12/2021 CLINICAL DATA:  Hip pain, stress fracture suspected, neg xray EXAM: CT OF THE RIGHT HIP WITHOUT CONTRAST TECHNIQUE: Multidetector CT imaging of the right hip was performed according to the standard protocol. Multiplanar CT image reconstructions were also generated.  RADIATION DOSE REDUCTION: This exam was performed according to the departmental dose-optimization program which includes automated exposure control, adjustment of the mA and/or kV according to patient size and/or use of iterative reconstruction technique. COMPARISON:  Hip radiograph 12/12/2020 FINDINGS: Bones/Joint/Cartilage There is no evidence of acute fracture. Alignment is normal. There is moderate right hip osteoarthritis. Ligaments Suboptimally assessed by CT. Muscles and Tendons Right gluteus minimus muscle atrophy. No acute myotendinous abnormality by CT. Soft tissues No focal fluid collection. No lymphadenopathy. Enlarged prostate gland with distension of the urinary bladder. Small right fat containing inguinal hernia. IMPRESSION: No evidence of acute right hip fracture. Moderate right hip osteoarthritis. Electronically Signed   By: Caprice Renshaw M.D.   On: 12/12/2021 13:03   CT HEAD WO CONTRAST ( )  Result Date: 12/12/2021 CLINICAL DATA:  Multiple falls.  Minor head trauma EXAM: CT HEAD WITHOUT CONTRAST CT CERVICAL SPINE WITHOUT CONTRAST TECHNIQUE: Multidetector CT imaging of the head and cervical spine was performed following the standard protocol without intravenous contrast. Multiplanar CT image reconstructions of the cervical  spine were also generated. RADIATION DOSE REDUCTION: This exam was performed according to the departmental dose-optimization program which includes automated exposure control, adjustment of the mA and/or kV according to patient size and/or use of iterative reconstruction technique. COMPARISON:  None Available. FINDINGS: CT HEAD FINDINGS Brain: No evidence of acute infarction, hemorrhage, hydrocephalus, extra-axial collection or mass lesion/mass effect. Mild generalized atrophy. Vascular: Negative for hyperdense vessel Skull: Negative for skull lesion or fracture Sinuses/Orbits: Mild mucosal edema paranasal sinuses. No orbital lesion. Other: None CT CERVICAL SPINE FINDINGS  Alignment: 3 mm anterolisthesis C4-5 Skull base and vertebrae: Negative for fracture or mass. Soft tissues and spinal canal: Negative for mass or soft tissue swelling Disc levels: Multilevel disc degeneration and facet degeneration. Foraminal narrowing bilaterally due to spurring at C4-5, C5-6, C6-7 Upper chest: Lung apices clear Other: None IMPRESSION: 1. Negative CT head 2. Negative for cervical spine fracture. Electronically Signed   By: Marlan Palau M.D.   On: 12/12/2021 10:54   CT Cervical Spine Wo Contrast  Result Date: 12/12/2021 CLINICAL DATA:  Multiple falls.  Minor head trauma EXAM: CT HEAD WITHOUT CONTRAST CT CERVICAL SPINE WITHOUT CONTRAST TECHNIQUE: Multidetector CT imaging of the head and cervical spine was performed following the standard protocol without intravenous contrast. Multiplanar CT image reconstructions of the cervical spine were also generated. RADIATION DOSE REDUCTION: This exam was performed according to the departmental dose-optimization program which includes automated exposure control, adjustment of the mA and/or kV according to patient size and/or use of iterative reconstruction technique. COMPARISON:  None Available. FINDINGS: CT HEAD FINDINGS Brain: No evidence of acute infarction, hemorrhage, hydrocephalus, extra-axial collection or mass lesion/mass effect. Mild generalized atrophy. Vascular: Negative for hyperdense vessel Skull: Negative for skull lesion or fracture Sinuses/Orbits: Mild mucosal edema paranasal sinuses. No orbital lesion. Other: None CT CERVICAL SPINE FINDINGS Alignment: 3 mm anterolisthesis C4-5 Skull base and vertebrae: Negative for fracture or mass. Soft tissues and spinal canal: Negative for mass or soft tissue swelling Disc levels: Multilevel disc degeneration and facet degeneration. Foraminal narrowing bilaterally due to spurring at C4-5, C5-6, C6-7 Upper chest: Lung apices clear Other: None IMPRESSION: 1. Negative CT head 2. Negative for cervical spine  fracture. Electronically Signed   By: Marlan Palau M.D.   On: 12/12/2021 10:54   DG Hip Unilat W or Wo Pelvis 2-3 Views Right  Result Date: 12/12/2021 CLINICAL DATA:  Right hip pain after multiple recent falls. EXAM: DG HIP (WITH OR WITHOUT PELVIS) 2-3V RIGHT COMPARISON:  None Available. FINDINGS: There is no evidence of hip fracture or dislocation. There is no evidence of arthropathy or other focal bone abnormality. Osteopenia. IMPRESSION: Negative. Electronically Signed   By: Obie Dredge M.D.   On: 12/12/2021 10:50   DG Ribs Unilateral W/Chest Left  Result Date: 12/12/2021 CLINICAL DATA:  Left-sided rib pain after multiple recent falls. EXAM: LEFT RIBS AND CHEST - 3+ VIEW COMPARISON:  Chest x-ray dated August 16, 2008. FINDINGS: Subacute appearing minimally displaced fracture of the left anterior seventh rib. No additional rib fracture. There is no evidence of pneumothorax or pleural effusion. Both lungs are clear. Heart size and mediastinal contours are within normal limits. IMPRESSION: 1. Subacute appearing minimally displaced fracture of the left anterior seventh rib. 2. No acute cardiopulmonary disease. Electronically Signed   By: Obie Dredge M.D.   On: 12/12/2021 10:45        Scheduled Meds:   stroke: early stages of recovery book   Does not apply Once   aspirin  EC  81 mg Oral Daily   heparin  5,000 Units Subcutaneous Q8H   lisinopril  5 mg Oral BID   rosuvastatin  10 mg Oral Daily   Continuous Infusions:  Assessment & Plan:   Principal Problem:   Frequent falls Active Problems:   Hypertension   CAD (coronary artery disease)   Hyperlipemia   Lymphedema   PAD (peripheral artery disease) (HCC)   Senile purpura (HCC)   Left rib fracture   At risk for constipation   Frequent falls Stroke like symptoms 6/23 mri no acute dz Echo no severe AS. Nml ef Neurology consult pending PT/OT-HH    Left rib fracture C/o pain Pain mx Add lidocaine   PAD (peripheral  artery disease) (HCC) On asa   Hyperlipemia On statin   CAD (coronary artery disease) - Rosuvastatin 10 mg daily resumed - Aspirin 364 mg p.o. one-time dose in the ED - Resumed home aspirin 81 mg daily    Hypertension Resume lisinopril Hydralazine prn   Was a mixup with the day  DVT prophylaxis: Heparin Code Status:full Family Communication: sister at bedside Disposition Plan: home Status is: Observation The patient remains OBS appropriate and will d/c before 2 midnights.   Awaiting neurology consult     LOS: 0 days   Time spent: 35 min    Lynn Ito, MD [facility] Pager [phone]  If 7PM-7AM, please contact night-coverage 12/13/2021, 3:00 PM

## 2021-12-14 DIAGNOSIS — R296 Repeated falls: Secondary | ICD-10-CM | POA: Diagnosis not present

## 2021-12-14 DIAGNOSIS — R531 Weakness: Secondary | ICD-10-CM

## 2021-12-14 MED ORDER — LIDOCAINE 5 % EX PTCH: 1.0000 | MEDICATED_PATCH | Freq: Every day | CUTANEOUS | 0 refills | Status: AC | PRN

## 2021-12-14 MED ORDER — VALACYCLOVIR HCL 500 MG PO TABS: 500.0000 mg | ORAL_TABLET | Freq: Two times a day (BID) | ORAL | 0 refills | Status: DC

## 2021-12-14 MED ORDER — CYANOCOBALAMIN 1000 MCG PO TABS: 1000.0000 ug | ORAL_TABLET | Freq: Every day | ORAL | 0 refills | Status: AC

## 2021-12-16 ENCOUNTER — Telehealth: Payer: Self-pay | Admitting: *Deleted

## 2021-12-23 ENCOUNTER — Encounter: Payer: Self-pay | Admitting: Family Medicine

## 2021-12-23 ENCOUNTER — Ambulatory Visit (INDEPENDENT_AMBULATORY_CARE_PROVIDER_SITE_OTHER): Payer: Medicare Other | Admitting: Family Medicine

## 2021-12-23 VITALS — BP 156/74 | HR 58 | Temp 98.2°F | Ht 69.02 in | Wt 165.8 lb

## 2021-12-23 DIAGNOSIS — I739 Peripheral vascular disease, unspecified: Secondary | ICD-10-CM

## 2021-12-23 DIAGNOSIS — I2510 Atherosclerotic heart disease of native coronary artery without angina pectoris: Secondary | ICD-10-CM | POA: Diagnosis not present

## 2021-12-23 DIAGNOSIS — M1611 Unilateral primary osteoarthritis, right hip: Secondary | ICD-10-CM | POA: Insufficient documentation

## 2021-12-23 DIAGNOSIS — R911 Solitary pulmonary nodule: Secondary | ICD-10-CM

## 2021-12-23 DIAGNOSIS — I6529 Occlusion and stenosis of unspecified carotid artery: Secondary | ICD-10-CM

## 2021-12-23 DIAGNOSIS — K4090 Unilateral inguinal hernia, without obstruction or gangrene, not specified as recurrent: Secondary | ICD-10-CM

## 2021-12-23 DIAGNOSIS — I700 Atherosclerosis of aorta: Secondary | ICD-10-CM | POA: Diagnosis not present

## 2021-12-23 DIAGNOSIS — I7121 Aneurysm of the ascending aorta, without rupture: Secondary | ICD-10-CM | POA: Diagnosis not present

## 2021-12-23 DIAGNOSIS — D692 Other nonthrombocytopenic purpura: Secondary | ICD-10-CM | POA: Diagnosis not present

## 2021-12-23 DIAGNOSIS — R296 Repeated falls: Secondary | ICD-10-CM | POA: Diagnosis not present

## 2021-12-23 DIAGNOSIS — S32010A Wedge compression fracture of first lumbar vertebra, initial encounter for closed fracture: Secondary | ICD-10-CM | POA: Diagnosis not present

## 2021-12-23 MED ORDER — MELOXICAM 15 MG PO TABS: 15.0000 mg | ORAL_TABLET | Freq: Every day | ORAL | 1 refills | Status: DC

## 2021-12-23 NOTE — Assessment & Plan Note (Signed)
Continue to follow with cardiology. Encouraged him to take his aspirin and crestor. Call with any concerns. Continue to monitor.  

## 2021-12-23 NOTE — Assessment & Plan Note (Signed)
Does not want further imaging at this time. Continue to monitor.

## 2021-12-23 NOTE — Assessment & Plan Note (Signed)
Continue to follow with cardiology. Encouraged him to take his aspirin and crestor. Call with any concerns. Continue to monitor.

## 2021-12-23 NOTE — Assessment & Plan Note (Signed)
Likely due to his hip. Will confirm home health PT is coming out and get him into ortho. Call with any concerns.

## 2021-12-23 NOTE — Patient Instructions (Signed)
[facility] July 5, 2PM Address: [address] [place], [place], [place] [address]

## 2021-12-23 NOTE — Assessment & Plan Note (Signed)
No pain at this time. Does not want surgery. Continue to monitor.

## 2021-12-23 NOTE — Assessment & Plan Note (Signed)
Reassured patient. Discussed aspirin, which he would like to continue. Continue to monitor.

## 2021-12-23 NOTE — Assessment & Plan Note (Signed)
Will refer to ortho. Continue lidoderm patches. Call with any concerns.

## 2021-12-23 NOTE — Progress Notes (Signed)
BP (!) 156/74   Pulse (!) 58   Temp 98.2 F (36.8 C) (Oral)   Ht 5' 9.02" (1.753 m)   Wt 165 lb 12.8 oz (75.2 kg)   SpO2 99%   BMI 24.47 kg/m    Subjective:    Patient ID: Nicholas Gross, male    DOB: 23-May-1928, 88 y.o.   MRN: [id number]  HPI: Nicholas Gross is a 88 y.o. male  Chief Complaint  Patient presents with   Hospitalization Follow-up    For Fall and has a broken Left side rib.  d/c's 6/24   Leg Pain    Right leg pain started prior to fall about 3 weeks ago   Transition of Care Hospital Follow up.   Hospital/Facility: [facility] D/C Physician: Dr. Lynn Ito D/C Date: 12/14/21  Records Requested: 12/23/21 Records Received: 12/23/21 Records Reviewed: 12/23/21  Diagnoses on Discharge:   Frequent falls   Hypertension   CAD (coronary artery disease)   Hyperlipemia   Lymphedema   PAD (peripheral artery disease) (HCC)   Senile purpura (HCC)   Left rib fracture   At risk for constipation  Date of interactive Contact within 48 hours of discharge: 11/22/21 Contact was through: phone  Date of 7 day or 14 day face-to-face visit: 12/23/21  within 14 days  Outpatient Encounter Medications as of 12/23/2021  Medication Sig   aspirin EC 81 MG tablet Take 1 tablet (81 mg total) by mouth daily. Swallow whole.   lidocaine (LIDODERM) 5 % Place 1 patch onto the skin daily as needed (rib pain). Remove & Discard patch within 12 hours or as directed by MD   lisinopril (ZESTRIL) 5 MG tablet Take 1 tablet (5 mg total) by mouth 2 (two) times daily.   meloxicam (MOBIC) 15 MG tablet Take 1 tablet (15 mg total) by mouth daily.   Multiple Vitamins-Minerals (PRESERVISION AREDS PO) Take 1 tablet by mouth 2 (two) times daily.   vitamin B-12 1000 MCG tablet Take 1 tablet (1,000 mcg total) by mouth daily.   rosuvastatin (CRESTOR) 10 MG tablet Take 1 tablet (10 mg total) by mouth daily. (Patient not taking: Reported on 12/23/2021)   [DISCONTINUED] valACYclovir (VALTREX) 500 MG tablet Take 1 tablet  (500 mg total) by mouth 2 (two) times daily for 10 days. (Patient not taking: Reported on 12/23/2021)   No facility-administered encounter medications on file as of 12/23/2021.  Per Hospitalist: "Brief/Interim Summary: Per HPI:Nicholas Gross is a 88 year old male with history of hypertension, hyperlipidemia, CAD, carotid atherosclerosis, PAD, senile purpura, who presents emergency department from home via neighbor for chief concerns of frequent falls over the last 2 weeks.  Initial vitals in the emergency department showed temperature of 98.4, respiration rate of 17, heart rate 62, blood pressure 166/75, SPO2 100% on room air.  Serum sodium is 133, potassium 4.0, chloride 102, bicarb 24, BUN of 11, serum creatinine of 0.62, GFR greater than 60, nonfasting blood glucose 119, WBC 6.9, hemoglobin 12.5, platelets of 209.  High since troponin was 6. UA was negative for leukocytes and nitrates Prior to 2 weeks ago, he walked at least 1 mile per day and did 40 push-ups daily.   He reports weakness over the last 2 weeks.  Neighbor reports that he has had increased falls and that the last couple days she has noticed difficulty speaking, specifically getting his words out    Frequent falls Does have macular degeneration Had some stroke like sx, but MRI negative for acute  abn. Ammonia nml  neurology was consulted- suspect mild delirium Echo no severe AS, nml EF PT/OT HH   Left rib fracture Controlled with lidocaine patch   Fever blister Started on valtrex   PAD (peripheral artery disease) (HCC) On asa   Hyperlipemia On statin  CAD (coronary artery disease) On statin and asa   Hypertension Continue home meds"  Diagnostic Tests Reviewed:  CLINICAL DATA:  Left-sided rib pain after multiple recent falls.   EXAM: LEFT RIBS AND CHEST - 3+ VIEW   COMPARISON:  Chest x-ray dated August 16, 2008.   FINDINGS: Subacute appearing minimally displaced fracture of the left anterior seventh  rib. No additional rib fracture. There is no evidence of pneumothorax or pleural effusion. Both lungs are clear. Heart size and mediastinal contours are within normal limits.   IMPRESSION: 1. Subacute appearing minimally displaced fracture of the left anterior seventh rib. 2. No acute cardiopulmonary disease.  CLINICAL DATA:  Right hip pain after multiple recent falls.   EXAM: DG HIP (WITH OR WITHOUT PELVIS) 2-3V RIGHT   COMPARISON:  None Available.   FINDINGS: There is no evidence of hip fracture or dislocation. There is no evidence of arthropathy or other focal bone abnormality. Osteopenia.   IMPRESSION: Negative.  CLINICAL DATA:  Multiple falls.  Minor head trauma   EXAM: CT HEAD WITHOUT CONTRAST   CT CERVICAL SPINE WITHOUT CONTRAST   TECHNIQUE: Multidetector CT imaging of the head and cervical spine was performed following the standard protocol without intravenous contrast. Multiplanar CT image reconstructions of the cervical spine were also generated.   RADIATION DOSE REDUCTION: This exam was performed according to the departmental dose-optimization program which includes automated exposure control, adjustment of the mA and/or kV according to patient size and/or use of iterative reconstruction technique.   COMPARISON:  None Available.   FINDINGS: CT HEAD FINDINGS   Brain: No evidence of acute infarction, hemorrhage, hydrocephalus, extra-axial collection or mass lesion/mass effect. Mild generalized atrophy.   Vascular: Negative for hyperdense vessel   Skull: Negative for skull lesion or fracture   Sinuses/Orbits: Mild mucosal edema paranasal sinuses. No orbital lesion.   Other: None   CT CERVICAL SPINE FINDINGS   Alignment: 3 mm anterolisthesis C4-5   Skull base and vertebrae: Negative for fracture or mass.   Soft tissues and spinal canal: Negative for mass or soft tissue swelling   Disc levels: Multilevel disc degeneration and facet  degeneration. Foraminal narrowing bilaterally due to spurring at C4-5, C5-6, C6-7   Upper chest: Lung apices clear   Other: None   IMPRESSION: 1. Negative CT head 2. Negative for cervical spine fracture.  CLINICAL DATA:  Post fall with LEFT chest pain.   EXAM: CT CHEST WITHOUT CONTRAST   TECHNIQUE: Multidetector CT imaging of the chest was performed following the standard protocol without IV contrast.   RADIATION DOSE REDUCTION: This exam was performed according to the departmental dose-optimization program which includes automated exposure control, adjustment of the mA and/or kV according to patient size and/or use of iterative reconstruction technique.   COMPARISON:  Rib series from December 12, 2021.   FINDINGS: Cardiovascular: Calcified aortic atherosclerosis. 4.6 cm transverse caliber of the ascending thoracic aorta. 4.4 cm in the coronal plane calcifications of the aortic valve. Calcifications of the mitral valve. Three-vessel coronary artery disease. No pericardial effusion or sign of pericardial thickening. Central pulmonary vasculature is of normal caliber. Limited assessment of cardiovascular structures given lack of intravenous contrast.   Mediastinum/Nodes: No thoracic inlet,  axillary, mediastinal or hilar adenopathy. Esophagus grossly normal.   Lungs/Pleura: No consolidation or sign of pleural effusion. No pneumothorax. Mild basilar atelectasis. Subtle ground-glass in the LEFT chest with bandlike appearance may reflect mild pneumonitis or component of atelectasis. Airways are patent.   Small RIGHT upper lobe pulmonary nodule (image 67/3) 4 mm.   Pleural and parenchymal scarring at the apices.   Upper Abdomen: Incidental imaging of upper abdominal contents without acute process related to imaged portions the liver, gallbladder, pancreas, spleen, adrenal glands, kidneys, stomach, small and large bowel. There are calcifications within the jejunal mesentery  which are incompletely imaged. Concomitant nodal enlargement may be present up to 12 mm in the jejunal mesentery in there is some mild stranding as well. No retroperitoneal adenopathy in the upper abdomen. Calcified aortic atherosclerosis tracking into the abdominal aorta.   Musculoskeletal: No destructive bone process. Spinal degenerative changes. Osteopenia with moderate degenerative changes of the spine. 40% loss of height of the L1 vertebral body.   IMPRESSION: 1. Subtle ground-glass in the LEFT chest with bandlike appearance may reflect mild pneumonitis or component of atelectasis. 2. 4 mm right solid pulmonary nodule within the upper lobe. If patient is low risk for malignancy, no routine follow-up imaging is recommended; if patient is high risk for malignancy, a non-contrast Chest CT at 12 months is optional. If performed and the nodule is stable at 12 months, no further follow-up is recommended. These guidelines do not apply to immunocompromised patients and patients with cancer. Follow up in patients with significant comorbidities as clinically warranted. For lung cancer screening, adhere to Lung-RADS guidelines. Reference: Radiology. 2017; 284(1):228-43. 3. There are calcifications within the jejunal mesentery which are incompletely imaged. Concomitant nodal enlargement may be present up to 12 mm in the jejunal mesentery in there is some mild stranding as well. Findings are nonspecific but can be seen in the setting of sclerosing mesenteritis, less likely lymphoproliferative disorder post treatment. Consider comparison with prior imaging or short interval follow-up with abdominal imaging. This does not appear to represent an acute finding. 4. Three-vessel coronary artery disease. 5. 4.6 cm transverse caliber, 4.4 cm in the coronal plane of the ascending thoracic aorta. Could consider the following, recommend annual imaging followup by CTA or MRA. This recommendation  follows 2010 ACCF/AHA/AATS/ACR/ASA/SCA/SCAI/SIR/STS/SVM Guidelines for the Diagnosis and Management of Patients with Thoracic Aortic Disease. Circulation. 2010; 121: Q259-D638. Aortic aneurysm NOS (ICD10-I71.9) also, given the calcifications of the aortic valve and concomitant aneurysmal change in the thoracic aorta could consider aortic stenosis. Correlate clinically. 6. Age-indeterminate compression fracture at L1 is without surrounding stranding. Correlate with point tenderness in this area. 7. Aortic atherosclerosis.   Aortic Atherosclerosis (ICD10-I70.0).  CLINICAL DATA:  Hip pain, stress fracture suspected, neg xray   EXAM: CT OF THE RIGHT HIP WITHOUT CONTRAST   TECHNIQUE: Multidetector CT imaging of the right hip was performed according to the standard protocol. Multiplanar CT image reconstructions were also generated.   RADIATION DOSE REDUCTION: This exam was performed according to the departmental dose-optimization program which includes automated exposure control, adjustment of the mA and/or kV according to patient size and/or use of iterative reconstruction technique.   COMPARISON:  Hip radiograph 12/12/2020   FINDINGS: Bones/Joint/Cartilage   There is no evidence of acute fracture. Alignment is normal. There is moderate right hip osteoarthritis.   Ligaments   Suboptimally assessed by CT.   Muscles and Tendons   Right gluteus minimus muscle atrophy. No acute myotendinous abnormality by CT.   Soft  tissues   No focal fluid collection. No lymphadenopathy. Enlarged prostate gland with distension of the urinary bladder. Small right fat containing inguinal hernia.   IMPRESSION: No evidence of acute right hip fracture. Moderate right hip osteoarthritis.  CLINICAL DATA:  Altered mental status   EXAM: MRI HEAD WITHOUT CONTRAST   TECHNIQUE: Multiplanar, multiecho pulse sequences of the brain and surrounding structures were obtained without intravenous  contrast.   COMPARISON:  None Available.   FINDINGS: Brain: No acute infarct, mass effect or extra-axial collection. No acute or chronic hemorrhage. There is multifocal hyperintense T2-weighted signal within the white matter. Parenchymal volume and CSF spaces are normal. The midline structures are normal.   Vascular: Major flow voids are preserved.   Skull and upper cervical spine: Normal calvarium and skull base. Visualized upper cervical spine and soft tissues are normal.   Sinuses/Orbits:No paranasal sinus fluid levels or advanced mucosal thickening. No mastoid or middle ear effusion. Normal orbits.   IMPRESSION: 1. No acute intracranial abnormality. 2. Findings of chronic small vessel ischemia.  Disposition: Home  Consults: None  Discharge Instructions: Follow up with PCP in 1 week, Please obtain BMP/CBC in one week   Disease/illness Education: Discussed today  Home Health/Community Services Discussions/Referrals:  In place  Establishment or re-establishment of referral orders for community resources: In place  Discussion with other health care providers: None  Assessment and Support of treatment regimen adherence: Good  Appointments Coordinated with: Patient and neighbor  Education for self-management, independent living, and ADLs: Discussed today  Since getting out of the hospital he has been doing OK. He notes that he is having trouble walking with the pain in his back and his hip. He thinks that that is why he fell. He was trying to get onto the floor to help his back feel better and he slipped. He notes that it's aching and sore. Pretty constant. He has not been doing anything for it. He is otherwise feeling OK. No other concerns or complaints at this time.   Relevant past medical, surgical, family and social history reviewed and updated as indicated. Interim medical history since our last visit reviewed. Allergies and medications reviewed and updated.  Review  of Systems  Constitutional: Negative.   Cardiovascular: Negative.   Gastrointestinal: Negative.   Musculoskeletal:  Positive for arthralgias, back pain and myalgias. Negative for gait problem, joint swelling, neck pain and neck stiffness.  Skin: Negative.   Neurological: Negative.   Psychiatric/Behavioral: Negative.      Per HPI unless specifically indicated above     Objective:    BP (!) 156/74   Pulse (!) 58   Temp 98.2 F (36.8 C) (Oral)   Ht 5' 9.02" (1.753 m)   Wt 165 lb 12.8 oz (75.2 kg)   SpO2 99%   BMI 24.47 kg/m   Wt Readings from Last 3 Encounters:  12/23/21 165 lb 12.8 oz (75.2 kg)  12/12/21 167 lb (75.8 kg)  11/08/21 167 lb (75.8 kg)    Physical Exam Vitals and nursing note reviewed.  Constitutional:      General: He is not in acute distress.    Appearance: Normal appearance. He is not ill-appearing, toxic-appearing or diaphoretic.  HENT:     Head: Normocephalic and atraumatic.     Right Ear: External ear normal.     Left Ear: External ear normal.     Nose: Nose normal.     Mouth/Throat:     Mouth: Mucous membranes are moist.  Pharynx: Oropharynx is clear.  Eyes:     General: No scleral icterus.       Right eye: No discharge.        Left eye: No discharge.     Extraocular Movements: Extraocular movements intact.     Conjunctiva/sclera: Conjunctivae normal.     Pupils: Pupils are equal, round, and reactive to light.  Cardiovascular:     Rate and Rhythm: Normal rate and regular rhythm.     Pulses: Normal pulses.     Heart sounds: Normal heart sounds. No murmur heard.    No friction rub. No gallop.  Pulmonary:     Effort: Pulmonary effort is normal. No respiratory distress.     Breath sounds: Normal breath sounds. No stridor. No wheezing, rhonchi or rales.  Chest:     Chest wall: No tenderness.  Musculoskeletal:        General: Tenderness (L 1) present. Normal range of motion.     Cervical back: Normal range of motion and neck supple.  Skin:     General: Skin is warm and dry.     Capillary Refill: Capillary refill takes less than 2 seconds.     Coloration: Skin is not jaundiced or pale.     Findings: No bruising, erythema, lesion or rash.  Neurological:     General: No focal deficit present.     Mental Status: He is alert and oriented to person, place, and time. Mental status is at baseline.     Gait: Gait abnormal.  Psychiatric:        Mood and Affect: Mood normal.        Behavior: Behavior normal.        Thought Content: Thought content normal.        Judgment: Judgment normal.     Results for orders placed or performed during the hospital encounter of 12/12/21  SARS Coronavirus 2 by RT PCR (hospital order[facility]) *cepheid single result test* Anterior Nasal Swab   Specimen: Anterior Nasal Swab  Result Value Ref Range   SARS Coronavirus 2 by RT PCR NEGATIVE NEGATIVE  Basic metabolic panel  Result Value Ref Range   Sodium 133 (L) 135 - 145 mmol/L   Potassium 4.0 3.5 - 5.1 mmol/L   Chloride 102 98 - 111 mmol/L   CO2 24 22 - 32 mmol/L   Glucose, Bld 119 (H) 70 - 99 mg/dL   BUN 11 8 - 23 mg/dL   Creatinine, Ser 6.960.69 0.61 - 1.24 mg/dL   Calcium 9.1 8.9 - 29.510.3 mg/dL   GFR, Estimated >28>60 >41>60 mL/min   Anion gap 7 5 - 15  CBC  Result Value Ref Range   WBC 6.9 4.0 - 10.5 K/uL   RBC 3.97 (L) 4.22 - 5.81 MIL/uL   Hemoglobin 12.5 (L) 13.0 - 17.0 g/dL   HCT 32.437.0 (L) 40.139.0 - 02.752.0 %   MCV 93.2 80.0 - 100.0 fL   MCH 31.5 26.0 - 34.0 pg   MCHC 33.8 30.0 - 36.0 g/dL   RDW 25.312.2 66.411.5 - 40.315.5 %   Platelets 209 150 - 400 K/uL   nRBC 0.0 0.0 - 0.2 %  Urinalysis, Routine w reflex microscopic  Result Value Ref Range   Color, Urine YELLOW (A) YELLOW   APPearance CLEAR (A) CLEAR   Specific Gravity, Urine 1.006 1.005 - 1.030   pH 7.0 5.0 - 8.0   Glucose, UA NEGATIVE NEGATIVE mg/dL   Hgb urine  dipstick NEGATIVE NEGATIVE   Bilirubin Urine NEGATIVE NEGATIVE   Ketones, ur NEGATIVE NEGATIVE mg/dL    Protein, ur NEGATIVE NEGATIVE mg/dL   Nitrite NEGATIVE NEGATIVE   Leukocytes,Ua NEGATIVE NEGATIVE  Urine Drug Screen, Qualitative ([facility] only)  Result Value Ref Range   Tricyclic, Ur Screen NONE DETECTED NONE DETECTED   Amphetamines, Ur Screen NONE DETECTED NONE DETECTED   MDMA (Ecstasy)Ur Screen NONE DETECTED NONE DETECTED   Cocaine Metabolite,Ur [place] NONE DETECTED NONE DETECTED   Opiate, Ur Screen NONE DETECTED NONE DETECTED   Phencyclidine (PCP) Ur S NONE DETECTED NONE DETECTED   Cannabinoid 50 Ng, Ur [place] NONE DETECTED NONE DETECTED   Barbiturates, Ur Screen NONE DETECTED NONE DETECTED   Benzodiazepine, Ur Scrn NONE DETECTED NONE DETECTED   Methadone Scn, Ur NONE DETECTED NONE DETECTED  Hepatic function panel  Result Value Ref Range   Total Protein 6.3 (L) 6.5 - 8.1 g/dL   Albumin 3.9 3.5 - 5.0 g/dL   AST 32 15 - 41 U/L   ALT 24 0 - 44 U/L   Alkaline Phosphatase 58 38 - 126 U/L   Total Bilirubin 0.8 0.3 - 1.2 mg/dL   Bilirubin, Direct 0.2 0.0 - 0.2 mg/dL   Indirect Bilirubin 0.6 0.3 - 0.9 mg/dL  Vitamin Y85  Result Value Ref Range   Vitamin B-12 310 180 - 914 pg/mL  Lipid panel  Result Value Ref Range   Cholesterol 105 0 - 200 mg/dL   Triglycerides 51 <027 mg/dL   HDL 54 >74 mg/dL   Total CHOL/HDL Ratio 1.9 RATIO   VLDL 10 0 - 40 mg/dL   LDL Cholesterol 41 0 - 99 mg/dL  Hemoglobin J2I  Result Value Ref Range   Hgb A1c MFr Bld 5.1 4.8 - 5.6 %   Mean Plasma Glucose 99.67 mg/dL  Ammonia  Result Value Ref Range   Ammonia 32 9 - 35 umol/L  ECHOCARDIOGRAM COMPLETE  Result Value Ref Range   Weight 2,672 oz   Height 69 in   BP 180/83 mmHg   Ao pk vel 1.90 m/s   AV Area VTI 1.05 cm2   AR max vel 0.99 cm2   AV Mean grad 8.3 mmHg   AV Peak grad 14.5 mmHg   S' Lateral 2.90 cm   AV Area mean vel 1.03 cm2   Area-P 1/2 2.19 cm2   MV VTI 1.13 cm2  Troponin I (High Sensitivity)  Result Value Ref Range   Troponin I (High Sensitivity) 6 <18 ng/L  Troponin I (High Sensitivity)   Result Value Ref Range   Troponin I (High Sensitivity) 6 <18 ng/L      Assessment & Plan:   Problem List Items Addressed This Visit       Cardiovascular and Mediastinum   CAD (coronary artery disease)    Continue to follow with cardiology. Encouraged him to take his aspirin and crestor. Call with any concerns. Continue to monitor.       Carotid atherosclerosis    Continue to follow with cardiology. Encouraged him to take his aspirin and crestor. Call with any concerns. Continue to monitor.       PAD (peripheral artery disease) (HCC)    Continue to follow with cardiology. Encouraged him to take his aspirin and crestor. Call with any concerns. Continue to monitor.       Senile purpura (HCC)    Reassured patient. Discussed aspirin, which he would like to continue. Continue to monitor.  Aneurysm of ascending aorta without rupture (HCC)    Does not want further imaging at this time. Continue to monitor. Call with any concerns.       Aortic atherosclerosis (HCC)    Continue to follow with cardiology. Encouraged him to take his aspirin and crestor. Call with any concerns. Continue to monitor.         Respiratory   Pulmonary nodule    Does not want further imaging at this time. Continue to monitor.         Musculoskeletal and Integument   Compression fracture of L1 lumbar vertebra (HCC)    Will refer to ortho. Continue lidoderm patches. Call with any concerns.       Relevant Orders   Ambulatory referral to Orthopedic Surgery   Osteoarthritis of right hip    Will start PRN meloxicam, he's very hesitant to start it. Referral to ortho placed today for ?steroid injection. Does not want surgery. Appointment scheduled for 2 days from now.       Relevant Medications   meloxicam (MOBIC) 15 MG tablet   Other Relevant Orders   Ambulatory referral to Orthopedic Surgery     Other   Frequent falls - Primary    Likely due to his hip. Will confirm home health PT is coming  out and get him into ortho. Call with any concerns.      Relevant Orders   CBC with Differential/Platelet   Basic metabolic panel   Right inguinal hernia    No pain at this time. Does not want surgery. Continue to monitor.         Follow up plan: Return in about 2 months (around 02/23/2022).

## 2021-12-23 NOTE — Assessment & Plan Note (Signed)
Will start PRN meloxicam, he's very hesitant to start it. Referral to ortho placed today for ?steroid injection. Does not want surgery. Appointment scheduled for 2 days from now.

## 2021-12-23 NOTE — Assessment & Plan Note (Signed)
Does not want further imaging at this time. Continue to monitor. Call with any concerns.

## 2021-12-24 ENCOUNTER — Encounter: Payer: Self-pay | Admitting: Family Medicine

## 2021-12-24 LAB — CBC WITH DIFFERENTIAL/PLATELET
Basophils Absolute: 0.1 10*3/uL (ref 0.0–0.2)
Basos: 1 %
EOS (ABSOLUTE): 0.1 10*3/uL (ref 0.0–0.4)
Eos: 2 %
Hematocrit: 35.5 % — ABNORMAL LOW (ref 37.5–51.0)
Hemoglobin: 12.1 g/dL — ABNORMAL LOW (ref 13.0–17.7)
Immature Grans (Abs): 0 10*3/uL (ref 0.0–0.1)
Immature Granulocytes: 0 %
Lymphocytes Absolute: 1 10*3/uL (ref 0.7–3.1)
Lymphs: 15 %
MCH: 31.8 pg (ref 26.6–33.0)
MCHC: 34.1 g/dL (ref 31.5–35.7)
MCV: 93 fL (ref 79–97)
Monocytes Absolute: 0.7 10*3/uL (ref 0.1–0.9)
Monocytes: 10 %
Neutrophils Absolute: 4.6 10*3/uL (ref 1.4–7.0)
Neutrophils: 72 %
Platelets: 270 10*3/uL (ref 150–450)
RBC: 3.8 x10E6/uL — ABNORMAL LOW (ref 4.14–5.80)
RDW: 12.3 % (ref 11.6–15.4)
WBC: 6.4 10*3/uL (ref 3.4–10.8)

## 2021-12-24 LAB — BASIC METABOLIC PANEL
BUN/Creatinine Ratio: 15 (ref 10–24)
BUN: 10 mg/dL (ref 10–36)
CO2: 20 mmol/L (ref 20–29)
Calcium: 8.8 mg/dL (ref 8.6–10.2)
Chloride: 100 mmol/L (ref 96–106)
Creatinine, Ser: 0.66 mg/dL — ABNORMAL LOW (ref 0.76–1.27)
Glucose: 104 mg/dL — ABNORMAL HIGH (ref 70–99)
Potassium: 4.2 mmol/L (ref 3.5–5.2)
Sodium: 133 mmol/L — ABNORMAL LOW (ref 134–144)
eGFR: 88 mL/min/{1.73_m2} (ref >59)

## 2021-12-25 ENCOUNTER — Telehealth: Payer: Self-pay

## 2021-12-25 DIAGNOSIS — M1611 Unilateral primary osteoarthritis, right hip: Secondary | ICD-10-CM | POA: Diagnosis not present

## 2021-12-25 DIAGNOSIS — S32010A Wedge compression fracture of first lumbar vertebra, initial encounter for closed fracture: Secondary | ICD-10-CM

## 2021-12-25 DIAGNOSIS — R296 Repeated falls: Secondary | ICD-10-CM

## 2021-12-25 DIAGNOSIS — I2510 Atherosclerotic heart disease of native coronary artery without angina pectoris: Secondary | ICD-10-CM

## 2021-12-25 DIAGNOSIS — I6529 Occlusion and stenosis of unspecified carotid artery: Secondary | ICD-10-CM

## 2021-12-25 NOTE — Telephone Encounter (Signed)
-----   Message from Dorcas Carrow, [place] sent at 12/23/2021 12:30 PM EDT ----- Can we please check on home health to make sure they are planning on going out for him

## 2021-12-25 NOTE — Telephone Encounter (Signed)
They were. If they didn't set it up, I will

## 2021-12-25 NOTE — Telephone Encounter (Signed)
I don't see where referral was placed to home health. Was the hospital suppose to order it ?

## 2021-12-26 NOTE — Telephone Encounter (Signed)
They did not get this set up for the patient.

## 2021-12-26 NOTE — Telephone Encounter (Signed)
Order placed

## 2022-01-02 DIAGNOSIS — I7121 Aneurysm of the ascending aorta, without rupture: Secondary | ICD-10-CM | POA: Diagnosis not present

## 2022-01-02 DIAGNOSIS — S32010D Wedge compression fracture of first lumbar vertebra, subsequent encounter for fracture with routine healing: Secondary | ICD-10-CM | POA: Diagnosis not present

## 2022-01-02 DIAGNOSIS — S2232XD Fracture of one rib, left side, subsequent encounter for fracture with routine healing: Secondary | ICD-10-CM | POA: Diagnosis not present

## 2022-01-02 DIAGNOSIS — I2510 Atherosclerotic heart disease of native coronary artery without angina pectoris: Secondary | ICD-10-CM | POA: Diagnosis not present

## 2022-01-02 DIAGNOSIS — M1611 Unilateral primary osteoarthritis, right hip: Secondary | ICD-10-CM | POA: Diagnosis not present

## 2022-01-02 DIAGNOSIS — I890 Lymphedema, not elsewhere classified: Secondary | ICD-10-CM | POA: Diagnosis not present

## 2022-01-02 DIAGNOSIS — I739 Peripheral vascular disease, unspecified: Secondary | ICD-10-CM | POA: Diagnosis not present

## 2022-01-02 DIAGNOSIS — I10 Essential (primary) hypertension: Secondary | ICD-10-CM | POA: Diagnosis not present

## 2022-01-02 DIAGNOSIS — Z9181 History of falling: Secondary | ICD-10-CM | POA: Diagnosis not present

## 2022-01-02 DIAGNOSIS — H3530 Unspecified macular degeneration: Secondary | ICD-10-CM | POA: Diagnosis not present

## 2022-01-06 DIAGNOSIS — I2510 Atherosclerotic heart disease of native coronary artery without angina pectoris: Secondary | ICD-10-CM | POA: Diagnosis not present

## 2022-01-06 DIAGNOSIS — I890 Lymphedema, not elsewhere classified: Secondary | ICD-10-CM | POA: Diagnosis not present

## 2022-01-06 DIAGNOSIS — H3530 Unspecified macular degeneration: Secondary | ICD-10-CM | POA: Diagnosis not present

## 2022-01-06 DIAGNOSIS — I739 Peripheral vascular disease, unspecified: Secondary | ICD-10-CM | POA: Diagnosis not present

## 2022-01-06 DIAGNOSIS — I7121 Aneurysm of the ascending aorta, without rupture: Secondary | ICD-10-CM | POA: Diagnosis not present

## 2022-01-06 DIAGNOSIS — M1611 Unilateral primary osteoarthritis, right hip: Secondary | ICD-10-CM | POA: Diagnosis not present

## 2022-01-06 DIAGNOSIS — I10 Essential (primary) hypertension: Secondary | ICD-10-CM | POA: Diagnosis not present

## 2022-01-06 DIAGNOSIS — Z9181 History of falling: Secondary | ICD-10-CM | POA: Diagnosis not present

## 2022-01-06 DIAGNOSIS — S2232XD Fracture of one rib, left side, subsequent encounter for fracture with routine healing: Secondary | ICD-10-CM | POA: Diagnosis not present

## 2022-01-06 DIAGNOSIS — S32010D Wedge compression fracture of first lumbar vertebra, subsequent encounter for fracture with routine healing: Secondary | ICD-10-CM | POA: Diagnosis not present

## 2022-01-10 DIAGNOSIS — I2510 Atherosclerotic heart disease of native coronary artery without angina pectoris: Secondary | ICD-10-CM | POA: Diagnosis not present

## 2022-01-10 DIAGNOSIS — H3530 Unspecified macular degeneration: Secondary | ICD-10-CM | POA: Diagnosis not present

## 2022-01-10 DIAGNOSIS — Z9181 History of falling: Secondary | ICD-10-CM | POA: Diagnosis not present

## 2022-01-10 DIAGNOSIS — M1611 Unilateral primary osteoarthritis, right hip: Secondary | ICD-10-CM | POA: Diagnosis not present

## 2022-01-10 DIAGNOSIS — S32010D Wedge compression fracture of first lumbar vertebra, subsequent encounter for fracture with routine healing: Secondary | ICD-10-CM | POA: Diagnosis not present

## 2022-01-10 DIAGNOSIS — I739 Peripheral vascular disease, unspecified: Secondary | ICD-10-CM | POA: Diagnosis not present

## 2022-01-10 DIAGNOSIS — S2232XD Fracture of one rib, left side, subsequent encounter for fracture with routine healing: Secondary | ICD-10-CM | POA: Diagnosis not present

## 2022-01-10 DIAGNOSIS — I890 Lymphedema, not elsewhere classified: Secondary | ICD-10-CM | POA: Diagnosis not present

## 2022-01-10 DIAGNOSIS — I10 Essential (primary) hypertension: Secondary | ICD-10-CM | POA: Diagnosis not present

## 2022-01-10 DIAGNOSIS — I7121 Aneurysm of the ascending aorta, without rupture: Secondary | ICD-10-CM | POA: Diagnosis not present

## 2022-01-16 DIAGNOSIS — M1611 Unilateral primary osteoarthritis, right hip: Secondary | ICD-10-CM | POA: Diagnosis not present

## 2022-01-16 DIAGNOSIS — I890 Lymphedema, not elsewhere classified: Secondary | ICD-10-CM | POA: Diagnosis not present

## 2022-01-16 DIAGNOSIS — I739 Peripheral vascular disease, unspecified: Secondary | ICD-10-CM | POA: Diagnosis not present

## 2022-01-16 DIAGNOSIS — I10 Essential (primary) hypertension: Secondary | ICD-10-CM | POA: Diagnosis not present

## 2022-01-16 DIAGNOSIS — I2510 Atherosclerotic heart disease of native coronary artery without angina pectoris: Secondary | ICD-10-CM | POA: Diagnosis not present

## 2022-01-16 DIAGNOSIS — I7121 Aneurysm of the ascending aorta, without rupture: Secondary | ICD-10-CM | POA: Diagnosis not present

## 2022-01-16 DIAGNOSIS — S32010D Wedge compression fracture of first lumbar vertebra, subsequent encounter for fracture with routine healing: Secondary | ICD-10-CM | POA: Diagnosis not present

## 2022-01-16 DIAGNOSIS — Z9181 History of falling: Secondary | ICD-10-CM | POA: Diagnosis not present

## 2022-01-16 DIAGNOSIS — H3530 Unspecified macular degeneration: Secondary | ICD-10-CM | POA: Diagnosis not present

## 2022-01-16 DIAGNOSIS — S2232XD Fracture of one rib, left side, subsequent encounter for fracture with routine healing: Secondary | ICD-10-CM | POA: Diagnosis not present

## 2022-01-17 ENCOUNTER — Other Ambulatory Visit (INDEPENDENT_AMBULATORY_CARE_PROVIDER_SITE_OTHER): Payer: Medicare Other | Admitting: Family Medicine

## 2022-01-17 DIAGNOSIS — H3530 Unspecified macular degeneration: Secondary | ICD-10-CM

## 2022-01-17 DIAGNOSIS — S32010A Wedge compression fracture of first lumbar vertebra, initial encounter for closed fracture: Secondary | ICD-10-CM | POA: Diagnosis not present

## 2022-01-17 DIAGNOSIS — M1611 Unilateral primary osteoarthritis, right hip: Secondary | ICD-10-CM

## 2022-01-17 DIAGNOSIS — Z87891 Personal history of nicotine dependence: Secondary | ICD-10-CM

## 2022-01-17 DIAGNOSIS — I7121 Aneurysm of the ascending aorta, without rupture: Secondary | ICD-10-CM | POA: Diagnosis not present

## 2022-01-17 DIAGNOSIS — I25118 Atherosclerotic heart disease of native coronary artery with other forms of angina pectoris: Secondary | ICD-10-CM

## 2022-01-17 DIAGNOSIS — S2232XD Fracture of one rib, left side, subsequent encounter for fracture with routine healing: Secondary | ICD-10-CM | POA: Diagnosis not present

## 2022-01-17 DIAGNOSIS — I10 Essential (primary) hypertension: Secondary | ICD-10-CM | POA: Diagnosis not present

## 2022-01-17 DIAGNOSIS — I739 Peripheral vascular disease, unspecified: Secondary | ICD-10-CM | POA: Diagnosis not present

## 2022-01-17 DIAGNOSIS — I890 Lymphedema, not elsewhere classified: Secondary | ICD-10-CM | POA: Diagnosis not present

## 2022-01-17 DIAGNOSIS — R296 Repeated falls: Secondary | ICD-10-CM | POA: Diagnosis not present

## 2022-01-17 NOTE — Progress Notes (Signed)
Received home health orders orders from [facility]. Start of care 01/02/22.   Certification and orders from 01/02/22 through 03/02/22 are reviewed, signed and faxed back to home health company.  Need of intermittent skilled services at home: Physical therapy to evaluate and monitor and establish a home exercise program. Social work evaluation to provide community resources. Nursing to monitor blood pressure  The home health care plan has been established by me and will be reviewed and updated as needed to maximize patient recovery.  I certify that all home health services have been and will be furnished to the patient while under my care.  Face-to-face encounter in which the need for home health services was established: 12/23/21  Patient is receiving home health services for the following diagnoses: Problem List Items Addressed This Visit       Cardiovascular and Mediastinum   Hypertension   CAD (coronary artery disease)   PAD (peripheral artery disease) (HCC)   Aneurysm of ascending aorta without rupture (HCC)     Musculoskeletal and Integument   Left rib fracture   Compression fracture of L1 lumbar vertebra (HCC)   Osteoarthritis of right hip - Primary     Other   Macular degeneration   Lymphedema   Frequent falls     Olevia Perches, DO

## 2022-01-22 DIAGNOSIS — I890 Lymphedema, not elsewhere classified: Secondary | ICD-10-CM | POA: Diagnosis not present

## 2022-01-22 DIAGNOSIS — S2232XD Fracture of one rib, left side, subsequent encounter for fracture with routine healing: Secondary | ICD-10-CM | POA: Diagnosis not present

## 2022-01-22 DIAGNOSIS — I7121 Aneurysm of the ascending aorta, without rupture: Secondary | ICD-10-CM | POA: Diagnosis not present

## 2022-01-22 DIAGNOSIS — I739 Peripheral vascular disease, unspecified: Secondary | ICD-10-CM | POA: Diagnosis not present

## 2022-01-22 DIAGNOSIS — M1611 Unilateral primary osteoarthritis, right hip: Secondary | ICD-10-CM | POA: Diagnosis not present

## 2022-01-22 DIAGNOSIS — H3530 Unspecified macular degeneration: Secondary | ICD-10-CM | POA: Diagnosis not present

## 2022-01-22 DIAGNOSIS — I10 Essential (primary) hypertension: Secondary | ICD-10-CM | POA: Diagnosis not present

## 2022-01-22 DIAGNOSIS — Z9181 History of falling: Secondary | ICD-10-CM | POA: Diagnosis not present

## 2022-01-22 DIAGNOSIS — I2510 Atherosclerotic heart disease of native coronary artery without angina pectoris: Secondary | ICD-10-CM | POA: Diagnosis not present

## 2022-01-22 DIAGNOSIS — S32010D Wedge compression fracture of first lumbar vertebra, subsequent encounter for fracture with routine healing: Secondary | ICD-10-CM | POA: Diagnosis not present

## 2022-01-23 ENCOUNTER — Ambulatory Visit: Payer: Medicare Other | Admitting: Family Medicine

## 2022-01-24 DIAGNOSIS — M9905 Segmental and somatic dysfunction of pelvic region: Secondary | ICD-10-CM | POA: Diagnosis not present

## 2022-01-24 DIAGNOSIS — M5416 Radiculopathy, lumbar region: Secondary | ICD-10-CM | POA: Diagnosis not present

## 2022-01-24 DIAGNOSIS — M62830 Muscle spasm of back: Secondary | ICD-10-CM | POA: Diagnosis not present

## 2022-01-24 DIAGNOSIS — M9903 Segmental and somatic dysfunction of lumbar region: Secondary | ICD-10-CM | POA: Diagnosis not present

## 2022-01-27 DIAGNOSIS — M9905 Segmental and somatic dysfunction of pelvic region: Secondary | ICD-10-CM | POA: Diagnosis not present

## 2022-01-27 DIAGNOSIS — M9903 Segmental and somatic dysfunction of lumbar region: Secondary | ICD-10-CM | POA: Diagnosis not present

## 2022-01-27 DIAGNOSIS — M5416 Radiculopathy, lumbar region: Secondary | ICD-10-CM | POA: Diagnosis not present

## 2022-01-27 DIAGNOSIS — M62830 Muscle spasm of back: Secondary | ICD-10-CM | POA: Diagnosis not present

## 2022-01-29 DIAGNOSIS — M62830 Muscle spasm of back: Secondary | ICD-10-CM | POA: Diagnosis not present

## 2022-01-29 DIAGNOSIS — M9903 Segmental and somatic dysfunction of lumbar region: Secondary | ICD-10-CM | POA: Diagnosis not present

## 2022-01-29 DIAGNOSIS — M5416 Radiculopathy, lumbar region: Secondary | ICD-10-CM | POA: Diagnosis not present

## 2022-01-29 DIAGNOSIS — M9905 Segmental and somatic dysfunction of pelvic region: Secondary | ICD-10-CM | POA: Diagnosis not present

## 2022-01-31 DIAGNOSIS — M62830 Muscle spasm of back: Secondary | ICD-10-CM | POA: Diagnosis not present

## 2022-01-31 DIAGNOSIS — M5416 Radiculopathy, lumbar region: Secondary | ICD-10-CM | POA: Diagnosis not present

## 2022-01-31 DIAGNOSIS — M9903 Segmental and somatic dysfunction of lumbar region: Secondary | ICD-10-CM | POA: Diagnosis not present

## 2022-01-31 DIAGNOSIS — M9905 Segmental and somatic dysfunction of pelvic region: Secondary | ICD-10-CM | POA: Diagnosis not present

## 2022-02-03 DIAGNOSIS — M5416 Radiculopathy, lumbar region: Secondary | ICD-10-CM | POA: Diagnosis not present

## 2022-02-03 DIAGNOSIS — M9905 Segmental and somatic dysfunction of pelvic region: Secondary | ICD-10-CM | POA: Diagnosis not present

## 2022-02-03 DIAGNOSIS — M9903 Segmental and somatic dysfunction of lumbar region: Secondary | ICD-10-CM | POA: Diagnosis not present

## 2022-02-03 DIAGNOSIS — M62830 Muscle spasm of back: Secondary | ICD-10-CM | POA: Diagnosis not present

## 2022-02-04 ENCOUNTER — Telehealth: Payer: Medicare Other

## 2022-02-04 ENCOUNTER — Telehealth: Payer: Self-pay

## 2022-02-04 NOTE — Telephone Encounter (Signed)
  Care Management   Follow Up Note   02/04/2022 Name: Nicholas Gross MRN: [id number] DOB: 1927/08/15   Referred by: [name], DO Reason for referral : Chronic Care Management (RNCM: Follow up for Chronic Disease Management and care coordination Needs )   An unsuccessful telephone outreach was attempted today. The patient was referred to the case management team for assistance with care management and care coordination.   Follow Up Plan: No further follow up required: the patient  has met the goals of care. Plan of care being closed. Did leave a VM if the patient needed to call the RNCM back  Noreene Larsson RN, MSN, [facility] Mobile: [phone]

## 2022-02-05 DIAGNOSIS — M62830 Muscle spasm of back: Secondary | ICD-10-CM | POA: Diagnosis not present

## 2022-02-05 DIAGNOSIS — M9905 Segmental and somatic dysfunction of pelvic region: Secondary | ICD-10-CM | POA: Diagnosis not present

## 2022-02-05 DIAGNOSIS — M5416 Radiculopathy, lumbar region: Secondary | ICD-10-CM | POA: Diagnosis not present

## 2022-02-05 DIAGNOSIS — M9903 Segmental and somatic dysfunction of lumbar region: Secondary | ICD-10-CM | POA: Diagnosis not present

## 2022-02-10 DIAGNOSIS — M9903 Segmental and somatic dysfunction of lumbar region: Secondary | ICD-10-CM | POA: Diagnosis not present

## 2022-02-10 DIAGNOSIS — M9905 Segmental and somatic dysfunction of pelvic region: Secondary | ICD-10-CM | POA: Diagnosis not present

## 2022-02-10 DIAGNOSIS — M62830 Muscle spasm of back: Secondary | ICD-10-CM | POA: Diagnosis not present

## 2022-02-10 DIAGNOSIS — M5416 Radiculopathy, lumbar region: Secondary | ICD-10-CM | POA: Diagnosis not present

## 2022-02-11 DIAGNOSIS — M62830 Muscle spasm of back: Secondary | ICD-10-CM | POA: Diagnosis not present

## 2022-02-11 DIAGNOSIS — M9903 Segmental and somatic dysfunction of lumbar region: Secondary | ICD-10-CM | POA: Diagnosis not present

## 2022-02-11 DIAGNOSIS — M9905 Segmental and somatic dysfunction of pelvic region: Secondary | ICD-10-CM | POA: Diagnosis not present

## 2022-02-11 DIAGNOSIS — M5416 Radiculopathy, lumbar region: Secondary | ICD-10-CM | POA: Diagnosis not present

## 2022-02-17 DIAGNOSIS — M62830 Muscle spasm of back: Secondary | ICD-10-CM | POA: Diagnosis not present

## 2022-02-17 DIAGNOSIS — M9905 Segmental and somatic dysfunction of pelvic region: Secondary | ICD-10-CM | POA: Diagnosis not present

## 2022-02-17 DIAGNOSIS — M5416 Radiculopathy, lumbar region: Secondary | ICD-10-CM | POA: Diagnosis not present

## 2022-02-17 DIAGNOSIS — M9903 Segmental and somatic dysfunction of lumbar region: Secondary | ICD-10-CM | POA: Diagnosis not present

## 2022-02-18 DIAGNOSIS — I10 Essential (primary) hypertension: Secondary | ICD-10-CM | POA: Diagnosis not present

## 2022-02-18 DIAGNOSIS — H3530 Unspecified macular degeneration: Secondary | ICD-10-CM | POA: Diagnosis not present

## 2022-02-18 DIAGNOSIS — I2510 Atherosclerotic heart disease of native coronary artery without angina pectoris: Secondary | ICD-10-CM | POA: Diagnosis not present

## 2022-02-18 DIAGNOSIS — I7121 Aneurysm of the ascending aorta, without rupture: Secondary | ICD-10-CM | POA: Diagnosis not present

## 2022-02-18 DIAGNOSIS — M1611 Unilateral primary osteoarthritis, right hip: Secondary | ICD-10-CM | POA: Diagnosis not present

## 2022-02-18 DIAGNOSIS — I739 Peripheral vascular disease, unspecified: Secondary | ICD-10-CM | POA: Diagnosis not present

## 2022-02-18 DIAGNOSIS — Z9181 History of falling: Secondary | ICD-10-CM | POA: Diagnosis not present

## 2022-02-18 DIAGNOSIS — S2232XD Fracture of one rib, left side, subsequent encounter for fracture with routine healing: Secondary | ICD-10-CM | POA: Diagnosis not present

## 2022-02-18 DIAGNOSIS — S32010D Wedge compression fracture of first lumbar vertebra, subsequent encounter for fracture with routine healing: Secondary | ICD-10-CM | POA: Diagnosis not present

## 2022-02-18 DIAGNOSIS — I890 Lymphedema, not elsewhere classified: Secondary | ICD-10-CM | POA: Diagnosis not present

## 2022-02-19 DIAGNOSIS — M9903 Segmental and somatic dysfunction of lumbar region: Secondary | ICD-10-CM | POA: Diagnosis not present

## 2022-02-19 DIAGNOSIS — M5416 Radiculopathy, lumbar region: Secondary | ICD-10-CM | POA: Diagnosis not present

## 2022-02-19 DIAGNOSIS — M9905 Segmental and somatic dysfunction of pelvic region: Secondary | ICD-10-CM | POA: Diagnosis not present

## 2022-02-19 DIAGNOSIS — M62830 Muscle spasm of back: Secondary | ICD-10-CM | POA: Diagnosis not present

## 2022-02-25 ENCOUNTER — Encounter: Payer: Self-pay | Admitting: Family Medicine

## 2022-02-25 ENCOUNTER — Ambulatory Visit: Payer: Self-pay | Admitting: *Deleted

## 2022-02-25 ENCOUNTER — Ambulatory Visit (INDEPENDENT_AMBULATORY_CARE_PROVIDER_SITE_OTHER): Payer: Medicare Other | Admitting: Family Medicine

## 2022-02-25 ENCOUNTER — Ambulatory Visit
Admission: RE | Admit: 2022-02-25 | Discharge: 2022-02-25 | Disposition: A | Discharge status: Home / Self Care | Payer: Medicare Other | Attending: Family Medicine | Admitting: Family Medicine

## 2022-02-25 ENCOUNTER — Ambulatory Visit
Admission: RE | Admit: 2022-02-25 | Discharge: 2022-02-25 | Disposition: A | Discharge status: Home / Self Care | Payer: Medicare Other | Source: Ambulatory Visit | Attending: Family Medicine | Admitting: Family Medicine

## 2022-02-25 VITALS — BP 186/80 | HR 56 | Temp 97.8°F | Wt 161.0 lb

## 2022-02-25 DIAGNOSIS — M9905 Segmental and somatic dysfunction of pelvic region: Secondary | ICD-10-CM | POA: Diagnosis not present

## 2022-02-25 DIAGNOSIS — M546 Pain in thoracic spine: Secondary | ICD-10-CM

## 2022-02-25 DIAGNOSIS — R0781 Pleurodynia: Secondary | ICD-10-CM | POA: Diagnosis not present

## 2022-02-25 DIAGNOSIS — M5441 Lumbago with sciatica, right side: Secondary | ICD-10-CM | POA: Diagnosis not present

## 2022-02-25 DIAGNOSIS — M9903 Segmental and somatic dysfunction of lumbar region: Secondary | ICD-10-CM | POA: Diagnosis not present

## 2022-02-25 DIAGNOSIS — M5416 Radiculopathy, lumbar region: Secondary | ICD-10-CM | POA: Diagnosis not present

## 2022-02-25 DIAGNOSIS — G8929 Other chronic pain: Secondary | ICD-10-CM | POA: Diagnosis not present

## 2022-02-25 DIAGNOSIS — M545 Low back pain, unspecified: Secondary | ICD-10-CM | POA: Diagnosis not present

## 2022-02-25 DIAGNOSIS — M62830 Muscle spasm of back: Secondary | ICD-10-CM | POA: Diagnosis not present

## 2022-02-25 MED ORDER — LIDOCAINE 5 % EX PTCH: 1.0000 | MEDICATED_PATCH | CUTANEOUS | 1 refills | Status: DC

## 2022-02-25 MED ORDER — GABAPENTIN 100 MG PO CAPS: 100.0000 mg | ORAL_CAPSULE | Freq: Every evening | ORAL | 3 refills | Status: DC | PRN

## 2022-02-25 NOTE — Telephone Encounter (Signed)
He weighs 73kg, so if we followed that dosing he would have to take 730-1095mg  daily. 100mg  is a very low dose.

## 2022-02-25 NOTE — Progress Notes (Signed)
BP (!) 186/80   Pulse (!) 56   Temp 97.8 F (36.6 C)   Wt 161 lb (73 kg)   SpO2 98%   BMI 23.76 kg/m    Subjective:    Patient ID: Nicholas Gross, male    DOB: Dec 26, 1927, 88 y.o.   MRN: [id number]  HPI: Nicholas Gross is a 88 y.o. male  Chief Complaint  Patient presents with   Pain    Patient states his right side is hurting, has been hurting for about 2-3 weeks. Has been taking tylenol for the pain.    Has not been doing well. Has had PT but they've only come out a few times and he has not been doing his exercises. Has had a a lot of pain in the R side of his flank/ribs for about the past 2-3 weeks. He has fallen again since the last time he was here. He finds the lidocaine patches helpful and will take tylenol. Will take the meloxicam. Did not follow up with ortho. Has been seeing chiropractry- not doing any thrusting. That had been helping until about 2 weeks ago. Pain is aching and sore and shoots down his leg. He has been shaking more than he usually has as well. No other concerns or complaints a this time.   Relevant past medical, surgical, family and social history reviewed and updated as indicated. Interim medical history since our last visit reviewed. Allergies and medications reviewed and updated.  Review of Systems  Constitutional: Negative.   Respiratory: Negative.    Cardiovascular: Negative.   Gastrointestinal: Negative.   Genitourinary: Negative.   Musculoskeletal:  Positive for arthralgias, back pain and myalgias. Negative for gait problem, joint swelling, neck pain and neck stiffness.  Skin: Negative.   Neurological:  Positive for tremors. Negative for dizziness, seizures, syncope, facial asymmetry, speech difficulty, weakness, light-headedness, numbness and headaches.  Psychiatric/Behavioral: Negative.      Per HPI unless specifically indicated above     Objective:    BP (!) 186/80   Pulse (!) 56   Temp 97.8 F (36.6 C)   Wt 161 lb (73 kg)    SpO2 98%   BMI 23.76 kg/m   Wt Readings from Last 3 Encounters:  02/25/22 161 lb (73 kg)  12/23/21 165 lb 12.8 oz (75.2 kg)  12/12/21 167 lb (75.8 kg)    Physical Exam Vitals and nursing note reviewed.  Constitutional:      General: He is not in acute distress.    Appearance: Normal appearance. He is normal weight. He is not ill-appearing, toxic-appearing or diaphoretic.     Comments: Uncomfortable appearing  HENT:     Head: Normocephalic and atraumatic.     Right Ear: External ear normal.     Left Ear: External ear normal.     Nose: Nose normal.     Mouth/Throat:     Mouth: Mucous membranes are moist.     Pharynx: Oropharynx is clear.  Eyes:     General: No scleral icterus.       Right eye: No discharge.        Left eye: No discharge.     Extraocular Movements: Extraocular movements intact.     Conjunctiva/sclera: Conjunctivae normal.     Pupils: Pupils are equal, round, and reactive to light.  Cardiovascular:     Rate and Rhythm: Normal rate and regular rhythm.     Pulses: Normal pulses.     Heart sounds: Normal heart sounds.  No murmur heard.    No friction rub. No gallop.  Pulmonary:     Effort: Pulmonary effort is normal. No respiratory distress.     Breath sounds: Normal breath sounds. No stridor. No wheezing, rhonchi or rales.  Chest:     Chest wall: No tenderness.  Musculoskeletal:        General: No swelling, tenderness, deformity or signs of injury. Normal range of motion.     Cervical back: Normal range of motion and neck supple.     Right lower leg: No edema.     Left lower leg: No edema.  Skin:    General: Skin is warm and dry.     Capillary Refill: Capillary refill takes less than 2 seconds.     Coloration: Skin is not jaundiced or pale.     Findings: No bruising, erythema, lesion or rash.  Neurological:     General: No focal deficit present.     Mental Status: He is alert and oriented to person, place, and time. Mental status is at baseline.      Comments: + shakes- full body tremor every 45 seconds or so  Psychiatric:        Mood and Affect: Mood normal.        Behavior: Behavior normal.        Thought Content: Thought content normal.        Judgment: Judgment normal.     Results for orders placed or performed in visit on 12/23/21  CBC with Differential/Platelet  Result Value Ref Range   WBC 6.4 3.4 - 10.8 x10E3/uL   RBC 3.80 (L) 4.14 - 5.80 x10E6/uL   Hemoglobin 12.1 (L) 13.0 - 17.7 g/dL   Hematocrit 35.5 (L) 37.5 - 51.0 %   MCV 93 79 - 97 fL   MCH 31.8 26.6 - 33.0 pg   MCHC 34.1 31.5 - 35.7 g/dL   RDW 12.3 11.6 - 15.4 %   Platelets 270 150 - 450 x10E3/uL   Neutrophils 72 Not Estab. %   Lymphs 15 Not Estab. %   Monocytes 10 Not Estab. %   Eos 2 Not Estab. %   Basos 1 Not Estab. %   Neutrophils Absolute 4.6 1.4 - 7.0 x10E3/uL   Lymphocytes Absolute 1.0 0.7 - 3.1 x10E3/uL   Monocytes Absolute 0.7 0.1 - 0.9 x10E3/uL   EOS (ABSOLUTE) 0.1 0.0 - 0.4 x10E3/uL   Basophils Absolute 0.1 0.0 - 0.2 x10E3/uL   Immature Granulocytes 0 Not Estab. %   Immature Grans (Abs) 0.0 0.0 - 0.1 [id number]  Basic metabolic panel  Result Value Ref Range   Glucose 104 (H) 70 - 99 mg/dL   BUN 10 10 - 36 mg/dL   Creatinine, Ser 0.66 (L) 0.76 - 1.27 mg/dL   eGFR 88 >59 mL/min/1.73   BUN/Creatinine Ratio 15 10 - 24   Sodium 133 (L) 134 - 144 mmol/L   Potassium 4.2 3.5 - 5.2 mmol/L   Chloride 100 96 - 106 mmol/L   CO2 20 20 - 29 mmol/L   Calcium 8.8 8.6 - 10.2 mg/dL      Assessment & Plan:   Problem List Items Addressed This Visit   None Visit Diagnoses     Chronic right-sided low back pain with right-sided sciatica    -  Primary   Will check x-rays and labs. Continue chiropractry as able. Start PRN gabapentin. May need to get him back into ortho- does not want to go at this time.  Relevant Medications   gabapentin (NEURONTIN) 100 MG capsule   Other Relevant Orders   CBC with Differential/Platelet   Comprehensive metabolic panel    DG Lumbar Spine Complete   Acute right-sided thoracic back pain       Will check x-rays and labs. Continue chiropractry as able. Start PRN gabapentin. May need to get him back into ortho- does not want to go at this time.    Relevant Orders   DG Ribs Unilateral Right        Follow up plan: Return in about 4 weeks (around 03/25/2022).

## 2022-02-25 NOTE — Telephone Encounter (Signed)
[facility]

## 2022-02-25 NOTE — Telephone Encounter (Signed)
Wrong information entered into chart in regards to [facility].   Reached out to patient and made aware of provider advise regarding gabapentin. Patient verbally understood.

## 2022-02-25 NOTE — Telephone Encounter (Signed)
Summary: discuss medication   Caller inquiring if the gabapentin (NEURONTIN) 100 MG capsule prescribed today by provider is too high of a dosage for pt   Caller is not on DPR   Please assist further      [name] neighbor, Holley Raring, called to ask if 100mg  was a large dose of Gabapentin because she googled it and it said a normal dose is 10-15mg . I looked it up on google while on the phone. It states 10-15mg  per kg of body weight.  She stated the pt's family was with him and gave it to him this afternoon. I asked if they were going to be there with him (he is 52) and she did not know but would call to make sure someone would be. I did not give her any pt information because she is not on the DPR. She states Dr. 99 put her on it today when she took him for his appt. Reason for Disposition  Health Information question, no triage required and triager able to answer question  Answer Assessment - Initial Assessment Questions 1. REASON FOR CALL or QUESTION: "What is your reason for calling today?" or "How can I best help you?" or "What question do you have that I can help answer?"     Is 100 mg of Gabapentin a high dose?  Protocols used: Information Only Call - No Triage-A-AH

## 2022-02-26 DIAGNOSIS — M9903 Segmental and somatic dysfunction of lumbar region: Secondary | ICD-10-CM | POA: Diagnosis not present

## 2022-02-26 DIAGNOSIS — M62830 Muscle spasm of back: Secondary | ICD-10-CM | POA: Diagnosis not present

## 2022-02-26 DIAGNOSIS — M5416 Radiculopathy, lumbar region: Secondary | ICD-10-CM | POA: Diagnosis not present

## 2022-02-26 DIAGNOSIS — M9905 Segmental and somatic dysfunction of pelvic region: Secondary | ICD-10-CM | POA: Diagnosis not present

## 2022-02-26 LAB — COMPREHENSIVE METABOLIC PANEL
ALT: 18 IU/L (ref 0–44)
AST: 21 IU/L (ref 0–40)
Albumin/Globulin Ratio: 2.2 (ref 1.2–2.2)
Albumin: 3.9 g/dL (ref 3.6–4.6)
Alkaline Phosphatase: 81 IU/L (ref 44–121)
BUN/Creatinine Ratio: 13 (ref 10–24)
BUN: 10 mg/dL (ref 10–36)
Bilirubin Total: 0.6 mg/dL (ref 0.0–1.2)
CO2: 20 mmol/L (ref 20–29)
Calcium: 9.3 mg/dL (ref 8.6–10.2)
Chloride: 97 mmol/L (ref 96–106)
Creatinine, Ser: 0.8 mg/dL (ref 0.76–1.27)
Globulin, Total: 1.8 g/dL (ref 1.5–4.5)
Glucose: 95 mg/dL (ref 70–99)
Potassium: 4 mmol/L (ref 3.5–5.2)
Sodium: 134 mmol/L (ref 134–144)
Total Protein: 5.7 g/dL — ABNORMAL LOW (ref 6.0–8.5)
eGFR: 83 mL/min/{1.73_m2} (ref >59)

## 2022-02-26 LAB — CBC WITH DIFFERENTIAL/PLATELET
Basophils Absolute: 0.1 10*3/uL (ref 0.0–0.2)
Basos: 1 %
EOS (ABSOLUTE): 0.2 10*3/uL (ref 0.0–0.4)
Eos: 3 %
Hematocrit: 35.4 % — ABNORMAL LOW (ref 37.5–51.0)
Hemoglobin: 12.1 g/dL — ABNORMAL LOW (ref 13.0–17.7)
Immature Grans (Abs): 0 10*3/uL (ref 0.0–0.1)
Immature Granulocytes: 0 %
Lymphocytes Absolute: 1.2 10*3/uL (ref 0.7–3.1)
Lymphs: 14 %
MCH: 32.2 pg (ref 26.6–33.0)
MCHC: 34.2 g/dL (ref 31.5–35.7)
MCV: 94 fL (ref 79–97)
Monocytes Absolute: 0.7 10*3/uL (ref 0.1–0.9)
Monocytes: 8 %
Neutrophils Absolute: 6.2 10*3/uL (ref 1.4–7.0)
Neutrophils: 74 %
Platelets: 264 10*3/uL (ref 150–450)
RBC: 3.76 x10E6/uL — ABNORMAL LOW (ref 4.14–5.80)
RDW: 12.4 % (ref 11.6–15.4)
WBC: 8.3 10*3/uL (ref 3.4–10.8)

## 2022-03-03 DIAGNOSIS — M5416 Radiculopathy, lumbar region: Secondary | ICD-10-CM | POA: Diagnosis not present

## 2022-03-03 DIAGNOSIS — M62830 Muscle spasm of back: Secondary | ICD-10-CM | POA: Diagnosis not present

## 2022-03-03 DIAGNOSIS — M9903 Segmental and somatic dysfunction of lumbar region: Secondary | ICD-10-CM | POA: Diagnosis not present

## 2022-03-03 DIAGNOSIS — M9905 Segmental and somatic dysfunction of pelvic region: Secondary | ICD-10-CM | POA: Diagnosis not present

## 2022-03-10 DIAGNOSIS — M62830 Muscle spasm of back: Secondary | ICD-10-CM | POA: Diagnosis not present

## 2022-03-10 DIAGNOSIS — M9905 Segmental and somatic dysfunction of pelvic region: Secondary | ICD-10-CM | POA: Diagnosis not present

## 2022-03-10 DIAGNOSIS — M9903 Segmental and somatic dysfunction of lumbar region: Secondary | ICD-10-CM | POA: Diagnosis not present

## 2022-03-10 DIAGNOSIS — M5416 Radiculopathy, lumbar region: Secondary | ICD-10-CM | POA: Diagnosis not present

## 2022-03-17 DIAGNOSIS — M9905 Segmental and somatic dysfunction of pelvic region: Secondary | ICD-10-CM | POA: Diagnosis not present

## 2022-03-17 DIAGNOSIS — M62830 Muscle spasm of back: Secondary | ICD-10-CM | POA: Diagnosis not present

## 2022-03-17 DIAGNOSIS — M5416 Radiculopathy, lumbar region: Secondary | ICD-10-CM | POA: Diagnosis not present

## 2022-03-17 DIAGNOSIS — M9903 Segmental and somatic dysfunction of lumbar region: Secondary | ICD-10-CM | POA: Diagnosis not present

## 2022-03-24 DIAGNOSIS — M9905 Segmental and somatic dysfunction of pelvic region: Secondary | ICD-10-CM | POA: Diagnosis not present

## 2022-03-24 DIAGNOSIS — M9903 Segmental and somatic dysfunction of lumbar region: Secondary | ICD-10-CM | POA: Diagnosis not present

## 2022-03-24 DIAGNOSIS — M62830 Muscle spasm of back: Secondary | ICD-10-CM | POA: Diagnosis not present

## 2022-03-24 DIAGNOSIS — M5416 Radiculopathy, lumbar region: Secondary | ICD-10-CM | POA: Diagnosis not present

## 2022-03-25 ENCOUNTER — Ambulatory Visit (INDEPENDENT_AMBULATORY_CARE_PROVIDER_SITE_OTHER): Payer: Medicare Other | Admitting: Family Medicine

## 2022-03-25 ENCOUNTER — Encounter: Payer: Self-pay | Admitting: Family Medicine

## 2022-03-25 VITALS — BP 180/86 | HR 52 | Temp 98.1°F | Wt 165.9 lb

## 2022-03-25 DIAGNOSIS — M5441 Lumbago with sciatica, right side: Secondary | ICD-10-CM

## 2022-03-25 DIAGNOSIS — Z23 Encounter for immunization: Secondary | ICD-10-CM | POA: Diagnosis not present

## 2022-03-25 DIAGNOSIS — G8929 Other chronic pain: Secondary | ICD-10-CM

## 2022-03-25 MED ORDER — GABAPENTIN 100 MG PO CAPS
100.0000 mg | ORAL_CAPSULE | Freq: Every evening | ORAL | 1 refills | Status: DC | PRN
Start: 2022-03-25 — End: 2022-06-19

## 2022-03-25 NOTE — Progress Notes (Signed)
BP (!) 180/86   Pulse (!) 52   Temp 98.1 F (36.7 C)   Wt 165 lb 14.4 oz (75.3 kg)   SpO2 100%   BMI 24.49 kg/m    Subjective:    Patient ID: Nicholas Gross, male    DOB: February 24, 1928, 88 y.o.   MRN: [id number]  HPI: Nicholas Gross is a 88 y.o. male  Chief Complaint  Patient presents with   Back Pain    Patient states the back pain is better, still having some side pain. Has still been going to the chiropractor.    Jerking    Patient jerking was getting better but this morning is worsened again.    BACK PAIN Duration: chronic Mechanism of injury: unknown Location: bilateral and low back Onset: gradual Severity: severe Quality: aching and sore Frequency: intermittent Radiation: down his leg Aggravating factors: lifting, movement Alleviating factors: gabapentin, chiropractry Status: stable Treatments attempted:  chiropractry, rest, ice, heat, APAP, ibuprofen, aleve, and HEP  Relief with NSAIDs?: mild Nighttime pain:  no Paresthesias / decreased sensation:  yes Bowel / bladder incontinence:  no Fevers:  no Dysuria / urinary frequency:  no  Relevant past medical, surgical, family and social history reviewed and updated as indicated. Interim medical history since our last visit reviewed. Allergies and medications reviewed and updated.  Review of Systems  Constitutional: Negative.   Respiratory: Negative.    Cardiovascular: Negative.   Gastrointestinal: Negative.   Musculoskeletal:  Positive for back pain, gait problem and myalgias. Negative for arthralgias, joint swelling, neck pain and neck stiffness.  Skin: Negative.   Psychiatric/Behavioral: Negative.      Per HPI unless specifically indicated above     Objective:    BP (!) 180/86   Pulse (!) 52   Temp 98.1 F (36.7 C)   Wt 165 lb 14.4 oz (75.3 kg)   SpO2 100%   BMI 24.49 kg/m   Wt Readings from Last 3 Encounters:  03/25/22 165 lb 14.4 oz (75.3 kg)  02/25/22 161 lb (73 kg)  12/23/21 165 lb  12.8 oz (75.2 kg)    Physical Exam Vitals and nursing note reviewed.  Constitutional:      General: He is not in acute distress.    Appearance: Normal appearance. He is normal weight. He is not ill-appearing, toxic-appearing or diaphoretic.  HENT:     Head: Normocephalic and atraumatic.     Right Ear: External ear normal.     Left Ear: External ear normal.     Nose: Nose normal.     Mouth/Throat:     Mouth: Mucous membranes are moist.     Pharynx: Oropharynx is clear.  Eyes:     General: No scleral icterus.       Right eye: No discharge.        Left eye: No discharge.     Extraocular Movements: Extraocular movements intact.     Conjunctiva/sclera: Conjunctivae normal.     Pupils: Pupils are equal, round, and reactive to light.  Cardiovascular:     Rate and Rhythm: Normal rate and regular rhythm.     Pulses: Normal pulses.     Heart sounds: Normal heart sounds. No murmur heard.    No friction rub. No gallop.  Pulmonary:     Effort: Pulmonary effort is normal. No respiratory distress.     Breath sounds: Normal breath sounds. No stridor. No wheezing, rhonchi or rales.  Chest:     Chest wall: No tenderness.  Musculoskeletal:        General: Normal range of motion.     Cervical back: Normal range of motion and neck supple.  Skin:    General: Skin is warm and dry.     Capillary Refill: Capillary refill takes less than 2 seconds.     Coloration: Skin is not jaundiced or pale.     Findings: No bruising, erythema, lesion or rash.  Neurological:     General: No focal deficit present.     Mental Status: He is alert and oriented to person, place, and time. Mental status is at baseline.  Psychiatric:        Mood and Affect: Mood normal.        Behavior: Behavior normal.        Thought Content: Thought content normal.        Judgment: Judgment normal.     Results for orders placed or performed in visit on 02/25/22  CBC with Differential/Platelet  Result Value Ref Range   WBC  8.3 3.4 - 10.8 x10E3/uL   RBC 3.76 (L) 4.14 - 5.80 x10E6/uL   Hemoglobin 12.1 (L) 13.0 - 17.7 g/dL   Hematocrit 35.4 (L) 37.5 - 51.0 %   MCV 94 79 - 97 fL   MCH 32.2 26.6 - 33.0 pg   MCHC 34.2 31.5 - 35.7 g/dL   RDW 12.4 11.6 - 15.4 %   Platelets 264 150 - 450 x10E3/uL   Neutrophils 74 Not Estab. %   Lymphs 14 Not Estab. %   Monocytes 8 Not Estab. %   Eos 3 Not Estab. %   Basos 1 Not Estab. %   Neutrophils Absolute 6.2 1.4 - 7.0 x10E3/uL   Lymphocytes Absolute 1.2 0.7 - 3.1 x10E3/uL   Monocytes Absolute 0.7 0.1 - 0.9 x10E3/uL   EOS (ABSOLUTE) 0.2 0.0 - 0.4 x10E3/uL   Basophils Absolute 0.1 0.0 - 0.2 x10E3/uL   Immature Granulocytes 0 Not Estab. %   Immature Grans (Abs) 0.0 0.0 - 0.1 x10E3/uL  Comprehensive metabolic panel  Result Value Ref Range   Glucose 95 70 - 99 mg/dL   BUN 10 10 - 36 mg/dL   Creatinine, Ser 0.80 0.76 - 1.27 mg/dL   eGFR 83 >59 mL/min/1.73   BUN/Creatinine Ratio 13 10 - 24   Sodium 134 134 - 144 mmol/L   Potassium 4.0 3.5 - 5.2 mmol/L   Chloride 97 96 - 106 mmol/L   CO2 20 20 - 29 mmol/L   Calcium 9.3 8.6 - 10.2 mg/dL   Total Protein 5.7 (L) 6.0 - 8.5 g/dL   Albumin 3.9 3.6 - 4.6 g/dL   Globulin, Total 1.8 1.5 - 4.5 g/dL   Albumin/Globulin Ratio 2.2 1.2 - 2.2   Bilirubin Total 0.6 0.0 - 1.2 mg/dL   Alkaline Phosphatase 81 44 - 121 IU/L   AST 21 0 - 40 IU/L   ALT 18 0 - 44 IU/L      Assessment & Plan:   Problem List Items Addressed This Visit   None Visit Diagnoses     Chronic right-sided low back pain with right-sided sciatica    -  Primary   Stable. More comfortable today. Continue gabapentin. Continue chiropractry. Call with any concerns.    Relevant Medications   gabapentin (NEURONTIN) 100 MG capsule   Need for influenza vaccination       Relevant Orders   Flu Vaccine QUAD High Dose(Fluad) (Completed)        Follow up  plan: Return in about 4 months (around 07/26/2022).

## 2022-04-01 ENCOUNTER — Ambulatory Visit: Payer: Self-pay

## 2022-04-01 NOTE — Telephone Encounter (Signed)
  Chief Complaint: Stomach issues Symptoms: Something crawling in stomach Frequency:  Pertinent Negatives: Patient denies  Disposition: [] ED /[] Urgent Care (no appt availability in office) / [x] Appointment(In office/virtual)/ []  [facility]/ [] Home Care/ [] Refused Recommended Disposition /[] [facility]/ []  Follow-up with PCP   Additional Notes: Spoke with emergency contact Nicholas Gross. Ms. Hassell Done states that pt ate raw sausage from a wild hog awhile back. Pt told Ms. Hassell Done that he has "something crawling in his stomach". Ms. Hassell Done does not know if he has diarrhea or other gastric upset. Ms. Hassell Done states that when she goes to pt's home he always has his pants open.   Per Ms.Carles Florea pt purchases a new tube of preparation H every week at the grocery store, but will not discuss issue with her further.   Ms. Hassell Done reports that pt has lost a lot of weight over the past year.     Summary: stomach discomfort   The patients neighbor shares that the patient has experienced stomach discomfort for multiple months   The patient shares with their neighbor that the patient has previously eaten raw sausage and has recently lost roughly 30 lbs within the past few months   The patient's neighbor is concerned that they may be experiencing a hernia as well   Please contact further when possible      Reason for Disposition  Requesting regular office appointment  Answer Assessment - Initial Assessment Questions 1. REASON FOR CALL or QUESTION: "What is your reason for calling today?" or "How can I best help you?" or "What question do you have that I can help answer?"     Spoke with Emergency contact Nicholas Gross. She is concerned about pt.  Protocols used: Information Only Call - No Triage-A-AH

## 2022-04-02 ENCOUNTER — Ambulatory Visit (INDEPENDENT_AMBULATORY_CARE_PROVIDER_SITE_OTHER): Payer: Medicare Other | Admitting: Family Medicine

## 2022-04-02 ENCOUNTER — Encounter: Payer: Self-pay | Admitting: Family Medicine

## 2022-04-02 VITALS — BP 176/82 | HR 54 | Temp 97.7°F | Wt 165.4 lb

## 2022-04-02 DIAGNOSIS — R109 Unspecified abdominal pain: Secondary | ICD-10-CM | POA: Diagnosis not present

## 2022-04-02 DIAGNOSIS — R253 Fasciculation: Secondary | ICD-10-CM

## 2022-04-02 NOTE — Progress Notes (Signed)
BP (!) 176/82   Pulse (!) 54   Temp 97.7 F (36.5 C)   Wt 165 lb 6.4 oz (75 kg)   SpO2 98%   BMI 24.41 kg/m    Subjective:    Patient ID: Nicholas Gross, male    DOB: 07/29/1927, 88 y.o.   MRN: [id number]  HPI: Nicholas Gross is a 88 y.o. male  Chief Complaint  Patient presents with   Abdominal Pain    Patient states when he sits down he feels pain in his right lower abdominal area, especially when he sits. Patient has to keep pants open so it is not pressing on area. No vomiting, no diarrhea.    ABDOMINAL ISSUES Duration: unsure Nature: no pain- something moving in his belly Location: epigastric  Severity: no pain  Radiation: no Episode duration: Frequency: intermittent Treatments attempted: none Constipation: no Diarrhea: no Episodes of diarrhea/day: Mucous in the stool: no Heartburn: no Bloating:no Flatulence: no Nausea: no Vomiting: no Episodes of vomit/day: Melena or hematochezia: no Rash: no Jaundice: no Fever: no Weight loss: no  His jerking has been stable. His friend is concerned that he is going to fall because of it. He has not been taking his gabapentin during the day. Unclear if it's helping at night. No other concerns or complaints at this time.   Relevant past medical, surgical, family and social history reviewed and updated as indicated. Interim medical history since our last visit reviewed. Allergies and medications reviewed and updated.  Review of Systems  Constitutional: Negative.   Respiratory: Negative.    Cardiovascular: Negative.   Gastrointestinal: Negative.   Musculoskeletal: Negative.   Neurological: Negative.   Psychiatric/Behavioral: Negative.      Per HPI unless specifically indicated above     Objective:    BP (!) 176/82   Pulse (!) 54   Temp 97.7 F (36.5 C)   Wt 165 lb 6.4 oz (75 kg)   SpO2 98%   BMI 24.41 kg/m   Wt Readings from Last 3 Encounters:  04/02/22 165 lb 6.4 oz (75 kg)  03/25/22 165 lb 14.4  oz (75.3 kg)  02/25/22 161 lb (73 kg)    Physical Exam Vitals and nursing note reviewed.  Constitutional:      General: He is not in acute distress.    Appearance: Normal appearance. He is normal weight. He is not ill-appearing, toxic-appearing or diaphoretic.  HENT:     Head: Normocephalic and atraumatic.     Right Ear: External ear normal.     Left Ear: External ear normal.     Nose: Nose normal.     Mouth/Throat:     Mouth: Mucous membranes are moist.     Pharynx: Oropharynx is clear.  Eyes:     General: No scleral icterus.       Right eye: No discharge.        Left eye: No discharge.     Extraocular Movements: Extraocular movements intact.     Conjunctiva/sclera: Conjunctivae normal.     Pupils: Pupils are equal, round, and reactive to light.  Cardiovascular:     Rate and Rhythm: Normal rate and regular rhythm.     Pulses: Normal pulses.     Heart sounds: Normal heart sounds. No murmur heard.    No friction rub. No gallop.  Pulmonary:     Effort: Pulmonary effort is normal. No respiratory distress.     Breath sounds: Normal breath sounds. No stridor. No wheezing, rhonchi  or rales.  Chest:     Chest wall: No tenderness.  Abdominal:     General: Abdomen is flat. Bowel sounds are normal.     Palpations: Abdomen is soft.     Tenderness: There is no abdominal tenderness.  Musculoskeletal:        General: Normal range of motion.     Cervical back: Normal range of motion and neck supple.  Skin:    General: Skin is warm and dry.     Capillary Refill: Capillary refill takes less than 2 seconds.     Coloration: Skin is not jaundiced or pale.     Findings: No bruising, erythema, lesion or rash.  Neurological:     General: No focal deficit present.     Mental Status: He is alert and oriented to person, place, and time. Mental status is at baseline.  Psychiatric:        Mood and Affect: Mood normal.        Behavior: Behavior normal.        Thought Content: Thought content  normal.        Judgment: Judgment normal.     Results for orders placed or performed in visit on 02/25/22  CBC with Differential/Platelet  Result Value Ref Range   WBC 8.3 3.4 - 10.8 x10E3/uL   RBC 3.76 (L) 4.14 - 5.80 x10E6/uL   Hemoglobin 12.1 (L) 13.0 - 17.7 g/dL   Hematocrit 31.7 (L) 66.3 - 51.0 %   MCV 94 79 - 97 fL   MCH 32.2 26.6 - 33.0 pg   MCHC 34.2 31.5 - 35.7 g/dL   RDW 35.1 15.0 - 53.2 %   Platelets 264 150 - 450 x10E3/uL   Neutrophils 74 Not Estab. %   Lymphs 14 Not Estab. %   Monocytes 8 Not Estab. %   Eos 3 Not Estab. %   Basos 1 Not Estab. %   Neutrophils Absolute 6.2 1.4 - 7.0 x10E3/uL   Lymphocytes Absolute 1.2 0.7 - 3.1 x10E3/uL   Monocytes Absolute 0.7 0.1 - 0.9 x10E3/uL   EOS (ABSOLUTE) 0.2 0.0 - 0.4 x10E3/uL   Basophils Absolute 0.1 0.0 - 0.2 x10E3/uL   Immature Granulocytes 0 Not Estab. %   Immature Grans (Abs) 0.0 0.0 - 0.1 x10E3/uL  Comprehensive metabolic panel  Result Value Ref Range   Glucose 95 70 - 99 mg/dL   BUN 10 10 - 36 mg/dL   Creatinine, Ser 4.14 0.76 - 1.27 mg/dL   eGFR 83 >75 JU/RCS/1.80   BUN/Creatinine Ratio 13 10 - 24   Sodium 134 134 - 144 mmol/L   Potassium 4.0 3.5 - 5.2 mmol/L   Chloride 97 96 - 106 mmol/L   CO2 20 20 - 29 mmol/L   Calcium 9.3 8.6 - 10.2 mg/dL   Total Protein 5.7 (L) 6.0 - 8.5 g/dL   Albumin 3.9 3.6 - 4.6 g/dL   Globulin, Total 1.8 1.5 - 4.5 g/dL   Albumin/Globulin Ratio 2.2 1.2 - 2.2   Bilirubin Total 0.6 0.0 - 1.2 mg/dL   Alkaline Phosphatase 81 44 - 121 IU/L   AST 21 0 - 40 IU/L   ALT 18 0 - 44 IU/L      Assessment & Plan:   Problem List Items Addressed This Visit   None Visit Diagnoses     Abdominal discomfort    -  Primary   Likey due to decreased muscle tone. No pain. Continue to monitor.    Jerking  Not doing well. Not taking his gabapentin during the day- would like to see neurology. Referral generated today.   Relevant Orders   Ambulatory referral to Neurology        Follow up  plan: Return if symptoms worsen or fail to improve.

## 2022-04-07 DIAGNOSIS — M9903 Segmental and somatic dysfunction of lumbar region: Secondary | ICD-10-CM | POA: Diagnosis not present

## 2022-04-07 DIAGNOSIS — M62830 Muscle spasm of back: Secondary | ICD-10-CM | POA: Diagnosis not present

## 2022-04-07 DIAGNOSIS — M9905 Segmental and somatic dysfunction of pelvic region: Secondary | ICD-10-CM | POA: Diagnosis not present

## 2022-04-07 DIAGNOSIS — M5416 Radiculopathy, lumbar region: Secondary | ICD-10-CM | POA: Diagnosis not present

## 2022-04-21 DIAGNOSIS — M62830 Muscle spasm of back: Secondary | ICD-10-CM | POA: Diagnosis not present

## 2022-04-21 DIAGNOSIS — M9905 Segmental and somatic dysfunction of pelvic region: Secondary | ICD-10-CM | POA: Diagnosis not present

## 2022-04-21 DIAGNOSIS — M5416 Radiculopathy, lumbar region: Secondary | ICD-10-CM | POA: Diagnosis not present

## 2022-04-21 DIAGNOSIS — M9903 Segmental and somatic dysfunction of lumbar region: Secondary | ICD-10-CM | POA: Diagnosis not present

## 2022-04-24 ENCOUNTER — Ambulatory Visit: Payer: Self-pay | Admitting: *Deleted

## 2022-04-24 ENCOUNTER — Ambulatory Visit
Admission: RE | Admit: 2022-04-24 | Discharge: 2022-04-24 | Disposition: A | Discharge status: Home / Self Care | Payer: Medicare Other | Source: Ambulatory Visit | Attending: Physician Assistant | Admitting: Physician Assistant

## 2022-04-24 ENCOUNTER — Ambulatory Visit (INDEPENDENT_AMBULATORY_CARE_PROVIDER_SITE_OTHER): Payer: Medicare Other | Admitting: Physician Assistant

## 2022-04-24 ENCOUNTER — Encounter: Payer: Self-pay | Admitting: Physician Assistant

## 2022-04-24 VITALS — BP 187/82 | HR 59 | Temp 97.5°F | Wt 160.9 lb

## 2022-04-24 DIAGNOSIS — I350 Nonrheumatic aortic (valve) stenosis: Secondary | ICD-10-CM | POA: Diagnosis not present

## 2022-04-24 DIAGNOSIS — L03119 Cellulitis of unspecified part of limb: Secondary | ICD-10-CM | POA: Diagnosis not present

## 2022-04-24 DIAGNOSIS — M7989 Other specified soft tissue disorders: Secondary | ICD-10-CM | POA: Insufficient documentation

## 2022-04-24 DIAGNOSIS — I10 Essential (primary) hypertension: Secondary | ICD-10-CM | POA: Diagnosis not present

## 2022-04-24 DIAGNOSIS — L03115 Cellulitis of right lower limb: Secondary | ICD-10-CM | POA: Insufficient documentation

## 2022-04-24 MED ORDER — CEPHALEXIN 500 MG PO CAPS: 500.0000 mg | ORAL_CAPSULE | Freq: Four times a day (QID) | ORAL | 0 refills | Status: AC

## 2022-04-24 NOTE — Telephone Encounter (Signed)
  Chief Complaint: leg swelling Symptoms: leg swelling- bilateral, pain, discolorations Frequency: 2-3 weeks Pertinent Negatives: Patient denies fever, chest pain, SOB Disposition: [] ED /[] Urgent Care (no appt availability in office) / [x] Appointment(In office/virtual)/ []  [facility]/ [] Home Care/ [] Refused Recommended Disposition /[] [facility]/ []  Follow-up with PCP Additional Notes:  Nicholas Gross is calling- patient is with her- appointment scheduled

## 2022-04-24 NOTE — Patient Instructions (Addendum)
  We have got you an apt for an Ultrasound of your left leg at [facility] at 3:45 today  Please complete this so we can make sure there isn't a blood clot in your leg  I would like you to reach out to your Cardiologist and request an urgent apt to discuss the new murmur I heard so they can evaluate you and make sure you don't need further testing It is also important to talk to them about your blood pressure as it is very high today   If you start to have trouble breathing or chest pain please go to the ED.   I have sent in a script for Keflex to help with the cellulitis - please finish the entire course of medication to help make sure your skin infection gets better.

## 2022-04-24 NOTE — Progress Notes (Signed)
Acute Office Visit   Patient: Nicholas Gross   DOB: [birth year]   88 y.o. Male  MRN: [id number] Visit Date: 04/24/2022  Today's healthcare provider: Oswaldo Conroy Zyan Coby, PA-C  Introduced myself to the patient as a Secondary school teacher and provided education on APPs in clinical practice.    Chief Complaint  Patient presents with   Leg Swelling    Pt states he has been having bilateral leg swelling for the last few weeks. States they have been itching as well. States the swelling and itching both come and go.    Subjective    HPI HPI     Leg Swelling    Additional comments: Pt states he has been having bilateral leg swelling for the last few weeks. States they have been itching as well. States the swelling and itching both come and go.       Last edited by Pablo Ledger, CMA on 04/24/2022  2:29 PM.       His neighbor, Holley Raring is here to assist with HPI  Reports he is having lower leg swelling Unsure when it started- thinks it started about a month ago but is not sure  Notes it has become more prominent in the last 2 weeks Gavin Pound reports this morning legs were warm to the touch and much more swollen He denies pain but reports itching in area Interventions: tried putting cream on it- unsure what kind    Medications: Outpatient Medications Prior to Visit  Medication Sig   aspirin EC 81 MG tablet Take 1 tablet (81 mg total) by mouth daily. Swallow whole.   cyanocobalamin (VITAMIN B12) 1000 MCG tablet Take 1,000 mcg by mouth daily.   gabapentin (NEURONTIN) 100 MG capsule Take 1 capsule (100 mg total) by mouth at bedtime as needed.   lidocaine (LIDODERM) 5 % Place 1 patch onto the skin daily. Remove & Discard patch within 12 hours or as directed by MD   lisinopril (ZESTRIL) 5 MG tablet Take 1 tablet (5 mg total) by mouth 2 (two) times daily.   meloxicam (MOBIC) 15 MG tablet Take 1 tablet (15 mg total) by mouth daily.   Multiple Vitamins-Minerals (PRESERVISION AREDS PO) Take 1  tablet by mouth 2 (two) times daily.   rosuvastatin (CRESTOR) 10 MG tablet Take 1 tablet (10 mg total) by mouth daily.   No facility-administered medications prior to visit.    Review of Systems  Cardiovascular:  Positive for leg swelling. Negative for chest pain and palpitations.  Skin:  Positive for rash.       Objective    BP (!) 187/82 (BP Location: Left Arm, Cuff Size: Small)   Pulse (!) 59   Temp (!) 97.5 F (36.4 C) (Oral)   Wt 160 lb 14.4 oz (73 kg)   SpO2 100%   BMI 23.75 kg/m    Physical Exam Vitals reviewed.  Constitutional:      General: He is awake.     Appearance: Normal appearance. He is well-developed, well-groomed and normal weight.  HENT:     Head: Normocephalic and atraumatic.  Cardiovascular:     Rate and Rhythm: Normal rate and regular rhythm.     Pulses:          Radial pulses are 2+ on the right side and 2+ on the left side.       Dorsalis pedis pulses are 1+ on the right side and 0 on the left side.  Heart sounds: Murmur heard.     Systolic murmur is present with a grade of 3/6.     Comments: Left calf is 15.5 inches in diameter  Right calf is 13.5 inches in diameter  Systolic murmur 3/6 whooshing character that radiates to right 2nd intercostal space  Pulmonary:     Effort: Pulmonary effort is normal.     Breath sounds: Normal breath sounds. No decreased air movement. No decreased breath sounds, wheezing, rhonchi or rales.  Musculoskeletal:     Cervical back: Normal range of motion.     Right lower leg: 1+ Edema present.     Left lower leg: 3+ Edema present.  Skin:    General: Skin is warm and dry.       Neurological:     Mental Status: He is alert.  Psychiatric:        Attention and Perception: Attention and perception normal.        Mood and Affect: Mood and affect normal.        Speech: Speech normal.        Behavior: Behavior is slowed. Behavior is cooperative.       No results found for any visits on 04/24/22.   Assessment & Plan      Return in about 2 weeks (around 05/08/2022) for HTN, lower extremity edema .        Problem List Items Addressed This Visit       Cardiovascular and Mediastinum   Hypertension    Chronic, ongoing  Does not appear well controlled Patient has been advised by Cardiology previously that further medication management may be required to manage HTN but appears to have declined at that time Recommend discussing potential addition of HCTZ to regimen to assist with BP control and edema Recommend follow up in 2-4 weeks to discuss this if patient is amenable       Aortic stenosis, mild     Other   Swelling of left lower extremity - Primary    Acute, on chronic, new concern Left lower leg was found to have 3+ pitting edema to level of knee and was 2 inches larger in circumference than right calf [place] rule for DVT was negative for clot Recommend he use compression stockings and will try to discuss adding HCTZ to HTN regimen to assist with swelling and BP management Recommend he follow up in with Cardiology       Relevant Orders   US Venous Img Lower Unilateral Left (Completed)   Cellulitis of lower extremity    Acute, new concern Reports several areas of red, itching skin  Rash along lower extremities is consistent with cellulitis  Will start Keflex 500 mg PO QID x 10 days to assist with resolution Recommend using emollients for moisturizing areas as there is prominent swelling in lower extremities as well.  Follow up as needed for persistent or progressing symptoms       Relevant Medications   cephALEXin (KEFLEX) 500 MG capsule     Return in about 2 weeks (around 05/08/2022) for HTN, lower extremity edema .   I, [place] Gipe E Hiroyuki Ozanich, PA-C, have reviewed all documentation for this visit. The documentation on 04/25/22 for the exam, diagnosis, procedures, and orders are all accurate and complete.   Talitha Givens, MHS, PA-C [facility]

## 2022-04-24 NOTE — Telephone Encounter (Signed)
Reason for Disposition  [1] Red area or streak [2] large (> 2 in. or 5 cm)  Answer Assessment - Initial Assessment Questions 1. ONSET: "When did the swelling start?" (e.g., minutes, hours, days)     2-3 weeks 2. LOCATION: "What part of the leg is swollen?"  "Are both legs swollen or just one leg?"     Both legs- Left is worse 3. SEVERITY: "How bad is the swelling?" (e.g., localized; mild, moderate, severe)   - Localized: Small area of swelling localized to one leg.   - MILD pedal edema: Swelling limited to foot and ankle, pitting edema < 1/4 inch (6 mm) deep, rest and elevation eliminate most or all swelling.   - MODERATE edema: Swelling of lower leg to knee, pitting edema > 1/4 inch (6 mm) deep, rest and elevation only partially reduce swelling.   - SEVERE edema: Swelling extends above knee, facial or hand swelling present.      moderate 4. REDNESS: "Does the swelling look red or infected?"     Red streaks in front - tight and hot 5. PAIN: "Is the swelling painful to touch?" If Yes, ask: "How painful is it?"   (Scale 1-10; mild, moderate or severe)     Yes- mild 6. FEVER: "Do you have a fever?" If Yes, ask: "What is it, how was it measured, and when did it start?"      no 7. CAUSE: "What do you think is causing the leg swelling?"     unsure 8. MEDICAL HISTORY: "Do you have a history of blood clots (e.g., DVT), cancer, heart failure, kidney disease, or liver failure?"     Heart disease 9. RECURRENT SYMPTOM: "Have you had leg swelling before?" If Yes, ask: "When was the last time?" "What happened that time?"     Last year- was treated with cream 10. OTHER SYMPTOMS: "Do you have any other symptoms?" (e.g., chest pain, difficulty breathing)       no 11. PREGNANCY: "Is there any chance you are pregnant?" "When was your last menstrual period?"       na  Protocols used: Leg Swelling and Edema-A-AH

## 2022-04-25 DIAGNOSIS — I350 Nonrheumatic aortic (valve) stenosis: Secondary | ICD-10-CM | POA: Insufficient documentation

## 2022-04-25 MED ORDER — HYDROCHLOROTHIAZIDE 12.5 MG PO CAPS: 12.5000 mg | ORAL_CAPSULE | Freq: Every day | ORAL | 1 refills | Status: DC

## 2022-04-25 NOTE — Assessment & Plan Note (Signed)
Chronic, ongoing  Does not appear well controlled Patient has been advised by Cardiology previously that further medication management may be required to manage HTN but appears to have declined at that time Recommend discussing potential addition of HCTZ to regimen to assist with BP control and edema Recommend follow up in 2-4 weeks to discuss this if patient is amenable

## 2022-04-25 NOTE — Addendum Note (Signed)
Addended by: Talitha Givens on: 04/25/2022 11:16 AM   Modules accepted: Orders

## 2022-04-25 NOTE — Assessment & Plan Note (Signed)
Acute, new concern Reports several areas of red, itching skin  Rash along lower extremities is consistent with cellulitis  Will start Keflex 500 mg PO QID x 10 days to assist with resolution Recommend using emollients for moisturizing areas as there is prominent swelling in lower extremities as well.  Follow up as needed for persistent or progressing symptoms

## 2022-04-25 NOTE — Assessment & Plan Note (Signed)
Acute, on chronic, new concern Left lower leg was found to have 3+ pitting edema to level of knee and was 2 inches larger in circumference than right calf [place] rule for DVT was negative for clot Recommend he use compression stockings and will try to discuss adding HCTZ to HTN regimen to assist with swelling and BP management Recommend he follow up in with Cardiology

## 2022-05-12 ENCOUNTER — Ambulatory Visit: Payer: Medicare Other | Admitting: Family Medicine

## 2022-05-12 DIAGNOSIS — M9905 Segmental and somatic dysfunction of pelvic region: Secondary | ICD-10-CM | POA: Diagnosis not present

## 2022-05-12 DIAGNOSIS — M5416 Radiculopathy, lumbar region: Secondary | ICD-10-CM | POA: Diagnosis not present

## 2022-05-12 DIAGNOSIS — M9903 Segmental and somatic dysfunction of lumbar region: Secondary | ICD-10-CM | POA: Diagnosis not present

## 2022-05-12 DIAGNOSIS — M62830 Muscle spasm of back: Secondary | ICD-10-CM | POA: Diagnosis not present

## 2022-05-26 ENCOUNTER — Telehealth: Payer: Self-pay | Admitting: Family Medicine

## 2022-05-26 NOTE — Telephone Encounter (Signed)
Copied from CRM [id number]. Topic: Medicare AWV >> May 26, 2022 11:47 AM Nicholas Gross wrote: Reason for CRM: Left message for patient to call back and schedule the Medicare Annual Wellness Visit (AWV) virtually or by telephone.  Last AWV 05/13/21  Please schedule at anytime with [facility] Health Advisor.  30 minute appointment  Any questions, please call me at [phone]

## 2022-05-28 ENCOUNTER — Other Ambulatory Visit: Payer: Self-pay | Admitting: Physician Assistant

## 2022-05-28 ENCOUNTER — Other Ambulatory Visit: Payer: Self-pay | Admitting: Family Medicine

## 2022-05-28 DIAGNOSIS — I10 Essential (primary) hypertension: Secondary | ICD-10-CM

## 2022-05-28 NOTE — Telephone Encounter (Signed)
Unable to refill per protocol, Rx request is too soon. Last refill 04/25/22 fo 30 and 1 rf. Should have enough through December.  Requested Prescriptions  Pending Prescriptions Disp Refills   hydrochlorothiazide (MICROZIDE) 12.5 MG capsule [Pharmacy Med Name: HYDROCHLOROTHIAZIDE 12.5 MG CAP] 30 capsule 1    Sig: TAKE 1 CAPSULE BY MOUTH ONCE DAILY     Cardiovascular: Diuretics - Thiazide Failed - 05/28/2022  2:50 PM      Failed - Last BP in normal range    BP Readings from Last 1 Encounters:  04/24/22 (!) 187/82         Passed - Cr in normal range and within 180 days    Creatinine, Ser  Date Value Ref Range Status  02/25/2022 0.80 0.76 - 1.27 mg/dL Final         Passed - K in normal range and within 180 days    Potassium  Date Value Ref Range Status  02/25/2022 4.0 3.5 - 5.2 mmol/L Final         Passed - Na in normal range and within 180 days    Sodium  Date Value Ref Range Status  02/25/2022 134 134 - 144 mmol/L Final         Passed - Valid encounter within last 6 months    Recent Outpatient Visits           1 month ago Swelling of left lower extremity   [facility] [place], Erin E, PA-C   1 month ago Abdominal discomfort   [facility] [place], Megan P, DO   2 months ago Chronic right-sided low back pain with right-sided sciatica   [facility] [place], Megan P, DO   3 months ago Chronic right-sided low back pain with right-sided sciatica   [facility] [place], Megan P, DO   5 months ago Frequent falls   [facility] [place], [place], DO       Future Appointments             In 2 months Laural Benes, Oralia Rud, DO [employer], [facility]

## 2022-05-28 NOTE — Telephone Encounter (Signed)
Requested Prescriptions  Pending Prescriptions Disp Refills   rosuvastatin (CRESTOR) 10 MG tablet [Pharmacy Med Name: ROSUVASTATIN CALCIUM 10 MG TAB] 90 tablet 0    Sig: TAKE 1 TABLET BY MOUTH ONCE DAILY     Cardiovascular:  Antilipid - Statins 2 Failed - 05/28/2022  2:51 PM      Failed - Lipid Panel in normal range within the last 12 months    Cholesterol, Total  Date Value Ref Range Status  11/08/2021 107 100 - 199 mg/dL Final   Cholesterol  Date Value Ref Range Status  12/13/2021 105 0 - 200 mg/dL Final   Cholesterol Piccolo, Waived  Date Value Ref Range Status  02/13/2016 123 <200 mg/dL Final    Comment:                            Desirable                <200                         Borderline High      200- 239                         High                     >239    LDL Chol Calc (NIH)  Date Value Ref Range Status  11/08/2021 41 0 - 99 mg/dL Final   LDL Cholesterol  Date Value Ref Range Status  12/13/2021 41 0 - 99 mg/dL Final    Comment:           Total Cholesterol/HDL:CHD Risk Coronary Heart Disease Risk Table                     Men   Women  1/2 Average Risk   3.4   3.3  Average Risk       5.0   4.4  2 X Average Risk   9.6   7.1  3 X Average Risk  23.4   11.0        Use the calculated Patient Ratio above and the CHD Risk Table to determine the patient's CHD Risk.        ATP III CLASSIFICATION (LDL):  <100     mg/dL   Optimal  361-443  mg/dL   Near or Above                    Optimal  130-159  mg/dL   Borderline  154-008  mg/dL   High  >676     mg/dL   Very High Performed at [facility], [address] [place], [place] [address]    HDL  Date Value Ref Range Status  12/13/2021 54 >40 mg/dL Final  32/67/1245 51 >80 mg/dL Final   Triglycerides  Date Value Ref Range Status  12/13/2021 51 <150 mg/dL Final   Triglycerides Piccolo,Waived  Date Value Ref Range Status  02/13/2016 114 <150 mg/dL Final    Comment:                             Normal                   <150  Borderline High     150 - 199                         High                200 - 499                         Very High                >499          Passed - Cr in normal range and within 360 days    Creatinine, Ser  Date Value Ref Range Status  02/25/2022 0.80 0.76 - 1.27 mg/dL Final         Passed - Patient is not pregnant      Passed - Valid encounter within last 12 months    Recent Outpatient Visits           1 month ago Swelling of left lower extremity   [facility] [place], Erin E, PA-C   1 month ago Abdominal discomfort   [place], Megan P, DO   2 months ago Chronic right-sided low back pain with right-sided sciatica   [place], DO   3 months ago Chronic right-sided low back pain with right-sided sciatica   [place], DO   5 months ago Frequent falls   [place], [place], DO       Future Appointments             In 2 months Wynetta Emery, Barb Merino, DO [employer], [facility]

## 2022-06-09 DIAGNOSIS — M9903 Segmental and somatic dysfunction of lumbar region: Secondary | ICD-10-CM | POA: Diagnosis not present

## 2022-06-09 DIAGNOSIS — M62838 Other muscle spasm: Secondary | ICD-10-CM | POA: Diagnosis not present

## 2022-06-09 DIAGNOSIS — M5416 Radiculopathy, lumbar region: Secondary | ICD-10-CM | POA: Diagnosis not present

## 2022-06-09 DIAGNOSIS — G250 Essential tremor: Secondary | ICD-10-CM | POA: Diagnosis not present

## 2022-06-09 DIAGNOSIS — R259 Unspecified abnormal involuntary movements: Secondary | ICD-10-CM | POA: Diagnosis not present

## 2022-06-09 DIAGNOSIS — M62830 Muscle spasm of back: Secondary | ICD-10-CM | POA: Diagnosis not present

## 2022-06-09 DIAGNOSIS — R2689 Other abnormalities of gait and mobility: Secondary | ICD-10-CM | POA: Diagnosis not present

## 2022-06-09 DIAGNOSIS — M9905 Segmental and somatic dysfunction of pelvic region: Secondary | ICD-10-CM | POA: Diagnosis not present

## 2022-06-10 ENCOUNTER — Ambulatory Visit (INDEPENDENT_AMBULATORY_CARE_PROVIDER_SITE_OTHER): Payer: Medicare Other | Admitting: Unknown Physician Specialty

## 2022-06-10 ENCOUNTER — Encounter: Payer: Self-pay | Admitting: Unknown Physician Specialty

## 2022-06-10 VITALS — BP 131/75 | HR 67 | Temp 97.6°F | Ht 69.02 in | Wt 152.5 lb

## 2022-06-10 DIAGNOSIS — I739 Peripheral vascular disease, unspecified: Secondary | ICD-10-CM | POA: Diagnosis not present

## 2022-06-10 DIAGNOSIS — R634 Abnormal weight loss: Secondary | ICD-10-CM | POA: Diagnosis not present

## 2022-06-10 DIAGNOSIS — Z79899 Other long term (current) drug therapy: Secondary | ICD-10-CM | POA: Diagnosis not present

## 2022-06-10 DIAGNOSIS — L0291 Cutaneous abscess, unspecified: Secondary | ICD-10-CM

## 2022-06-10 MED ORDER — CEFUROXIME AXETIL 250 MG PO TABS: 250.0000 mg | ORAL_TABLET | Freq: Two times a day (BID) | ORAL | 0 refills | Status: DC

## 2022-06-10 NOTE — Assessment & Plan Note (Signed)
Significant PAD.  He does not want to go to vascular as they told him nothing much to do other than surgery.  I am reluctant to put a Unna boot with poor circulation.  Refer to wound care clinic

## 2022-06-10 NOTE — Progress Notes (Signed)
BP 131/75   Pulse 67   Temp 97.6 F (36.4 C) (Oral)   Ht 5' 9.02" (1.753 m)   Wt 152 lb 8 oz (69.2 kg)   SpO2 99%   BMI 22.51 kg/m    Subjective:    Patient ID: Nicholas Gross, male    DOB: 03-Mar-1928, 88 y.o.   MRN: [id number]  HPI: Nicholas Gross is a 88 y.o. male  Chief Complaint  Patient presents with   Wound on leg    Left shin. Area is red and warm    Elbow hematoma    Left elbow, walked to close to the door   Pt is here with a caregiver/neighbor who gives some of the history.  Pt with right lower leg anterior abscess.  This was treated last month with good success and healing.  Hx of PAD and had been to vascular, though was told nothing could be done besides surgery.  Pt reports no fever or pain.  He is seeing neurology for "jerking" motions.  Given Gabapentin but not working well but neighbor admits to poor adherence  Neighbor states he is not good with taking medication and if they can be switched to just daily.    Relevant past medical, surgical, family and social history reviewed and updated as indicated. Interim medical history since our last visit reviewed. Allergies and medications reviewed and updated.  Review of Systems  Eyes:  Positive for itching.    Per HPI unless specifically indicated above     Objective:    BP 131/75   Pulse 67   Temp 97.6 F (36.4 C) (Oral)   Ht 5' 9.02" (1.753 m)   Wt 152 lb 8 oz (69.2 kg)   SpO2 99%   BMI 22.51 kg/m   Wt Readings from Last 3 Encounters:  06/10/22 152 lb 8 oz (69.2 kg)  04/24/22 160 lb 14.4 oz (73 kg)  04/02/22 165 lb 6.4 oz (75 kg)    Physical Exam Constitutional:      General: He is not in acute distress.    Appearance: Normal appearance. He is well-developed.  HENT:     Head: Normocephalic and atraumatic.  Eyes:     General: Lids are normal. No scleral icterus.       Right eye: No discharge.        Left eye: No discharge.     Conjunctiva/sclera: Conjunctivae normal.  Neck:      Vascular: No carotid bruit or JVD.  Cardiovascular:     Rate and Rhythm: Normal rate and regular rhythm.     Pulses:          Dorsalis pedis pulses are 0 on the left side.     Heart sounds: Normal heart sounds.     Comments: Poor capillary refill.   Pulmonary:     Effort: Pulmonary effort is normal. No respiratory distress.     Breath sounds: Normal breath sounds.  Abdominal:     Palpations: There is no hepatomegaly or splenomegaly.  Musculoskeletal:        General: Normal range of motion.     Cervical back: Normal range of motion and neck supple.  Skin:    General: Skin is warm and dry.     Coloration: Skin is not pale.     Findings: No rash.     Comments: 5 cm abrasion left lower leg with surrounding warmth and erythema.    Neurological:     Mental Status:  He is alert and oriented to person, place, and time.  Psychiatric:        Behavior: Behavior normal.        Thought Content: Thought content normal.        Judgment: Judgment normal.     Results for orders placed or performed in visit on 02/25/22  CBC with Differential/Platelet  Result Value Ref Range   WBC 8.3 3.4 - 10.8 x10E3/uL   RBC 3.76 (L) 4.14 - 5.80 x10E6/uL   Hemoglobin 12.1 (L) 13.0 - 17.7 g/dL   Hematocrit 35.4 (L) 37.5 - 51.0 %   MCV 94 79 - 97 fL   MCH 32.2 26.6 - 33.0 pg   MCHC 34.2 31.5 - 35.7 g/dL   RDW 12.4 11.6 - 15.4 %   Platelets 264 150 - 450 x10E3/uL   Neutrophils 74 Not Estab. %   Lymphs 14 Not Estab. %   Monocytes 8 Not Estab. %   Eos 3 Not Estab. %   Basos 1 Not Estab. %   Neutrophils Absolute 6.2 1.4 - 7.0 x10E3/uL   Lymphocytes Absolute 1.2 0.7 - 3.1 x10E3/uL   Monocytes Absolute 0.7 0.1 - 0.9 x10E3/uL   EOS (ABSOLUTE) 0.2 0.0 - 0.4 x10E3/uL   Basophils Absolute 0.1 0.0 - 0.2 x10E3/uL   Immature Granulocytes 0 Not Estab. %   Immature Grans (Abs) 0.0 0.0 - 0.1 x10E3/uL  Comprehensive metabolic panel  Result Value Ref Range   Glucose 95 70 - 99 mg/dL   BUN 10 10 - 36 mg/dL    Creatinine, Ser 0.80 0.76 - 1.27 mg/dL   eGFR 83 >59 mL/min/1.73   BUN/Creatinine Ratio 13 10 - 24   Sodium 134 134 - 144 mmol/L   Potassium 4.0 3.5 - 5.2 mmol/L   Chloride 97 96 - 106 mmol/L   CO2 20 20 - 29 mmol/L   Calcium 9.3 8.6 - 10.2 mg/dL   Total Protein 5.7 (L) 6.0 - 8.5 g/dL   Albumin 3.9 3.6 - 4.6 g/dL   Globulin, Total 1.8 1.5 - 4.5 g/dL   Albumin/Globulin Ratio 2.2 1.2 - 2.2   Bilirubin Total 0.6 0.0 - 1.2 mg/dL   Alkaline Phosphatase 81 44 - 121 IU/L   AST 21 0 - 40 IU/L   ALT 18 0 - 44 IU/L      Assessment & Plan:   Problem List Items Addressed This Visit       Unprioritized   PAD (peripheral artery disease) (HCC)    Significant PAD.  He does not want to go to vascular as they told him nothing much to do other than surgery.  I am reluctant to put a Unna boot with poor circulation.  Refer to wound care clinic      Relevant Orders   AMB referral to wound care center   Other Visit Diagnoses     Abscess    -  Primary   Pt with recurrent abscess left lower leg secondary to PAD. Rx for Keflex. Keep wound moist and covered.  Refer to wound care.  Given dressing supplies   Relevant Orders   AMB referral to wound care center   Encounter for medication review       Reviewed medications.  Stopped ASA, Meloxicam (wasn't really taking it anyway), and HCTZ he was taking for leg swelling.   Weight loss       Noting 8 lb weight loss from last month.  Will set up f/u visit with Dr.  Johnson       Follow up plan: With Dr. Wynetta Emery for weight loss

## 2022-06-16 ENCOUNTER — Other Ambulatory Visit: Payer: Self-pay

## 2022-06-16 ENCOUNTER — Emergency Department: Payer: Medicare Other

## 2022-06-16 DIAGNOSIS — I739 Peripheral vascular disease, unspecified: Secondary | ICD-10-CM | POA: Diagnosis present

## 2022-06-16 DIAGNOSIS — L03115 Cellulitis of right lower limb: Secondary | ICD-10-CM | POA: Diagnosis not present

## 2022-06-16 DIAGNOSIS — S0990XA Unspecified injury of head, initial encounter: Secondary | ICD-10-CM | POA: Diagnosis not present

## 2022-06-16 DIAGNOSIS — I10 Essential (primary) hypertension: Secondary | ICD-10-CM | POA: Diagnosis not present

## 2022-06-16 DIAGNOSIS — E871 Hypo-osmolality and hyponatremia: Secondary | ICD-10-CM | POA: Diagnosis not present

## 2022-06-16 DIAGNOSIS — Z809 Family history of malignant neoplasm, unspecified: Secondary | ICD-10-CM

## 2022-06-16 DIAGNOSIS — I890 Lymphedema, not elsewhere classified: Secondary | ICD-10-CM | POA: Diagnosis not present

## 2022-06-16 DIAGNOSIS — Z87891 Personal history of nicotine dependence: Secondary | ICD-10-CM

## 2022-06-16 DIAGNOSIS — Z79899 Other long term (current) drug therapy: Secondary | ICD-10-CM

## 2022-06-16 DIAGNOSIS — M25551 Pain in right hip: Secondary | ICD-10-CM | POA: Diagnosis not present

## 2022-06-16 DIAGNOSIS — R296 Repeated falls: Secondary | ICD-10-CM | POA: Diagnosis not present

## 2022-06-16 DIAGNOSIS — M7989 Other specified soft tissue disorders: Secondary | ICD-10-CM | POA: Diagnosis not present

## 2022-06-16 DIAGNOSIS — J3489 Other specified disorders of nose and nasal sinuses: Secondary | ICD-10-CM | POA: Diagnosis not present

## 2022-06-16 DIAGNOSIS — I2510 Atherosclerotic heart disease of native coronary artery without angina pectoris: Secondary | ICD-10-CM | POA: Diagnosis present

## 2022-06-16 DIAGNOSIS — I878 Other specified disorders of veins: Secondary | ICD-10-CM | POA: Diagnosis present

## 2022-06-16 DIAGNOSIS — Z888 Allergy status to other drugs, medicaments and biological substances status: Secondary | ICD-10-CM

## 2022-06-16 DIAGNOSIS — S32401A Unspecified fracture of right acetabulum, initial encounter for closed fracture: Principal | ICD-10-CM | POA: Diagnosis present

## 2022-06-16 DIAGNOSIS — Z955 Presence of coronary angioplasty implant and graft: Secondary | ICD-10-CM | POA: Diagnosis not present

## 2022-06-16 DIAGNOSIS — E785 Hyperlipidemia, unspecified: Secondary | ICD-10-CM | POA: Diagnosis present

## 2022-06-16 DIAGNOSIS — Z8249 Family history of ischemic heart disease and other diseases of the circulatory system: Secondary | ICD-10-CM

## 2022-06-16 DIAGNOSIS — Z23 Encounter for immunization: Secondary | ICD-10-CM

## 2022-06-16 DIAGNOSIS — L03116 Cellulitis of left lower limb: Secondary | ICD-10-CM | POA: Diagnosis present

## 2022-06-16 DIAGNOSIS — W19XXXA Unspecified fall, initial encounter: Secondary | ICD-10-CM | POA: Diagnosis present

## 2022-06-16 DIAGNOSIS — M25531 Pain in right wrist: Secondary | ICD-10-CM | POA: Diagnosis not present

## 2022-06-16 DIAGNOSIS — I6523 Occlusion and stenosis of bilateral carotid arteries: Secondary | ICD-10-CM | POA: Diagnosis not present

## 2022-06-16 DIAGNOSIS — R600 Localized edema: Secondary | ICD-10-CM | POA: Diagnosis not present

## 2022-06-16 DIAGNOSIS — S199XXA Unspecified injury of neck, initial encounter: Secondary | ICD-10-CM | POA: Diagnosis not present

## 2022-06-16 DIAGNOSIS — S32409A Unspecified fracture of unspecified acetabulum, initial encounter for closed fracture: Secondary | ICD-10-CM | POA: Diagnosis not present

## 2022-06-16 LAB — CBC
HCT: 34.4 % — ABNORMAL LOW (ref 39.0–52.0)
Hemoglobin: 11.7 g/dL — ABNORMAL LOW (ref 13.0–17.0)
MCH: 31.9 pg (ref 26.0–34.0)
MCHC: 34 g/dL (ref 30.0–36.0)
MCV: 93.7 fL (ref 80.0–100.0)
Platelets: 224 10*3/uL (ref 150–400)
RBC: 3.67 MIL/uL — ABNORMAL LOW (ref 4.22–5.81)
RDW: 11.6 % (ref 11.5–15.5)
WBC: 7.5 10*3/uL (ref 4.0–10.5)
nRBC: 0 % (ref 0.0–0.2)

## 2022-06-16 LAB — BASIC METABOLIC PANEL
Anion gap: 9 (ref 5–15)
BUN: 25 mg/dL — ABNORMAL HIGH (ref 8–23)
CO2: 25 mmol/L (ref 22–32)
Calcium: 9.1 mg/dL (ref 8.9–10.3)
Chloride: 97 mmol/L — ABNORMAL LOW (ref 98–111)
Creatinine, Ser: 1.09 mg/dL (ref 0.61–1.24)
GFR, Estimated: >60 mL/min (ref >60)
Glucose, Bld: 101 mg/dL — ABNORMAL HIGH (ref 70–99)
Potassium: 3.9 mmol/L (ref 3.5–5.1)
Sodium: 131 mmol/L — ABNORMAL LOW (ref 135–145)

## 2022-06-16 NOTE — ED Triage Notes (Signed)
Pt fell on Thursday when walking up stairs, but refused to come to the ED, and then today he fell twice in the morning trying to get pants on. The falls today were unwitnessed. Pt states prior to the fall he was dizzy.  Pt takes an Asprin a day. Pt states no pain now, but with movement his right hip hurts.  Pt states when he fell he hit his right hip and right wrist. Wrist noted to have dressing on it.   Neighbor states pt has early dementia, pt is able to answer all questions, but states the year is 2003

## 2022-06-17 ENCOUNTER — Emergency Department: Payer: Medicare Other

## 2022-06-17 ENCOUNTER — Inpatient Hospital Stay
Admission: EM | Admit: 2022-06-17 | Discharge: 2022-06-19 | DRG: 536 | Disposition: A | Discharge status: Home Health | Payer: Medicare Other | Attending: Internal Medicine | Admitting: Internal Medicine

## 2022-06-17 ENCOUNTER — Encounter: Payer: Self-pay | Admitting: Internal Medicine

## 2022-06-17 DIAGNOSIS — L03116 Cellulitis of left lower limb: Secondary | ICD-10-CM

## 2022-06-17 DIAGNOSIS — I10 Essential (primary) hypertension: Secondary | ICD-10-CM | POA: Diagnosis not present

## 2022-06-17 DIAGNOSIS — I739 Peripheral vascular disease, unspecified: Secondary | ICD-10-CM | POA: Diagnosis present

## 2022-06-17 DIAGNOSIS — Z87891 Personal history of nicotine dependence: Secondary | ICD-10-CM | POA: Diagnosis not present

## 2022-06-17 DIAGNOSIS — Z888 Allergy status to other drugs, medicaments and biological substances status: Secondary | ICD-10-CM | POA: Diagnosis not present

## 2022-06-17 DIAGNOSIS — M7989 Other specified soft tissue disorders: Secondary | ICD-10-CM | POA: Diagnosis not present

## 2022-06-17 DIAGNOSIS — L03115 Cellulitis of right lower limb: Secondary | ICD-10-CM | POA: Diagnosis not present

## 2022-06-17 DIAGNOSIS — J3489 Other specified disorders of nose and nasal sinuses: Secondary | ICD-10-CM | POA: Diagnosis not present

## 2022-06-17 DIAGNOSIS — S32401A Unspecified fracture of right acetabulum, initial encounter for closed fracture: Secondary | ICD-10-CM | POA: Diagnosis not present

## 2022-06-17 DIAGNOSIS — E785 Hyperlipidemia, unspecified: Secondary | ICD-10-CM | POA: Diagnosis present

## 2022-06-17 DIAGNOSIS — Z23 Encounter for immunization: Secondary | ICD-10-CM | POA: Diagnosis not present

## 2022-06-17 DIAGNOSIS — W19XXXA Unspecified fall, initial encounter: Secondary | ICD-10-CM | POA: Diagnosis not present

## 2022-06-17 DIAGNOSIS — S32409A Unspecified fracture of unspecified acetabulum, initial encounter for closed fracture: Secondary | ICD-10-CM | POA: Diagnosis present

## 2022-06-17 DIAGNOSIS — S0990XA Unspecified injury of head, initial encounter: Secondary | ICD-10-CM | POA: Diagnosis not present

## 2022-06-17 DIAGNOSIS — Z955 Presence of coronary angioplasty implant and graft: Secondary | ICD-10-CM | POA: Diagnosis not present

## 2022-06-17 DIAGNOSIS — Z809 Family history of malignant neoplasm, unspecified: Secondary | ICD-10-CM | POA: Diagnosis not present

## 2022-06-17 DIAGNOSIS — I2510 Atherosclerotic heart disease of native coronary artery without angina pectoris: Secondary | ICD-10-CM | POA: Diagnosis present

## 2022-06-17 DIAGNOSIS — I890 Lymphedema, not elsewhere classified: Secondary | ICD-10-CM

## 2022-06-17 DIAGNOSIS — Z79899 Other long term (current) drug therapy: Secondary | ICD-10-CM | POA: Diagnosis not present

## 2022-06-17 DIAGNOSIS — I6523 Occlusion and stenosis of bilateral carotid arteries: Secondary | ICD-10-CM | POA: Diagnosis not present

## 2022-06-17 DIAGNOSIS — Z8249 Family history of ischemic heart disease and other diseases of the circulatory system: Secondary | ICD-10-CM | POA: Diagnosis not present

## 2022-06-17 DIAGNOSIS — S32414A Nondisplaced fracture of anterior wall of right acetabulum, initial encounter for closed fracture: Secondary | ICD-10-CM | POA: Diagnosis not present

## 2022-06-17 DIAGNOSIS — R296 Repeated falls: Secondary | ICD-10-CM | POA: Diagnosis not present

## 2022-06-17 DIAGNOSIS — E871 Hypo-osmolality and hyponatremia: Secondary | ICD-10-CM | POA: Diagnosis present

## 2022-06-17 DIAGNOSIS — S199XXA Unspecified injury of neck, initial encounter: Secondary | ICD-10-CM | POA: Diagnosis not present

## 2022-06-17 DIAGNOSIS — I878 Other specified disorders of veins: Secondary | ICD-10-CM | POA: Diagnosis present

## 2022-06-17 DIAGNOSIS — R600 Localized edema: Secondary | ICD-10-CM | POA: Diagnosis not present

## 2022-06-17 LAB — PROCALCITONIN: Procalcitonin: <0.1 ng/mL

## 2022-06-17 LAB — URINALYSIS, ROUTINE W REFLEX MICROSCOPIC
Bacteria, UA: NONE SEEN
Bilirubin Urine: NEGATIVE
Glucose, UA: NEGATIVE mg/dL
Hgb urine dipstick: NEGATIVE
Ketones, ur: NEGATIVE mg/dL
Leukocytes,Ua: NEGATIVE
Nitrite: NEGATIVE
Protein, ur: NEGATIVE mg/dL
Specific Gravity, Urine: 1.017 (ref 1.005–1.030)
Squamous Epithelial / HPF: NONE SEEN (ref 0–5)
pH: 6 (ref 5.0–8.0)

## 2022-06-17 LAB — TROPONIN I (HIGH SENSITIVITY): Troponin I (High Sensitivity): 10 ng/L (ref <18)

## 2022-06-17 MED ORDER — VANCOMYCIN HCL 1500 MG/300ML IV SOLN
1500.0000 mg | Freq: Once | INTRAVENOUS | Status: AC
  Administered 2022-06-17: 1500 mg via INTRAVENOUS
  Filled 2022-06-17: qty 300

## 2022-06-17 MED ORDER — TETANUS-DIPHTH-ACELL PERTUSSIS 5-2.5-18.5 LF-MCG/0.5 IM SUSY
0.5000 mL | PREFILLED_SYRINGE | Freq: Once | INTRAMUSCULAR | Status: AC
  Administered 2022-06-17: 0.5 mL via INTRAMUSCULAR
  Filled 2022-06-17: qty 0.5

## 2022-06-17 MED ORDER — SODIUM CHLORIDE 0.9 % IV SOLN
2.0000 g | Freq: Once | INTRAVENOUS | Status: AC
  Administered 2022-06-17: 2 g via INTRAVENOUS
  Filled 2022-06-17: qty 20

## 2022-06-17 MED ORDER — MORPHINE SULFATE (PF) 2 MG/ML IV SOLN
0.5000 mg | INTRAVENOUS | Status: DC | PRN
  Administered 2022-06-17 – 2022-06-18 (×3): 0.5 mg via INTRAVENOUS
  Filled 2022-06-17 (×3): qty 1

## 2022-06-17 MED ORDER — ROSUVASTATIN CALCIUM 10 MG PO TABS
10.0000 mg | ORAL_TABLET | Freq: Every day | ORAL | Status: DC
  Administered 2022-06-17 – 2022-06-19 (×3): 10 mg via ORAL
  Filled 2022-06-17 (×3): qty 1

## 2022-06-17 MED ORDER — ONDANSETRON HCL 4 MG/2ML IJ SOLN: 4.0000 mg | Freq: Four times a day (QID) | INTRAMUSCULAR | Status: DC | PRN

## 2022-06-17 MED ORDER — LISINOPRIL 5 MG PO TABS
5.0000 mg | ORAL_TABLET | Freq: Two times a day (BID) | ORAL | Status: DC
  Administered 2022-06-17 – 2022-06-19 (×5): 5 mg via ORAL
  Filled 2022-06-17 (×5): qty 1

## 2022-06-17 MED ORDER — BACITRACIN ZINC 500 UNIT/GM EX OINT
TOPICAL_OINTMENT | Freq: Once | CUTANEOUS | Status: AC
  Filled 2022-06-17: qty 0.9

## 2022-06-17 MED ORDER — HYDROCODONE-ACETAMINOPHEN 5-325 MG PO TABS
1.0000 | ORAL_TABLET | Freq: Four times a day (QID) | ORAL | Status: DC | PRN
  Administered 2022-06-17 – 2022-06-18 (×2): 2 via ORAL
  Filled 2022-06-17 (×2): qty 2

## 2022-06-17 MED ORDER — ORAL CARE MOUTH RINSE: 15.0000 mL | OROMUCOSAL | Status: DC | PRN

## 2022-06-17 MED ORDER — ACETAMINOPHEN 500 MG PO TABS
1000.0000 mg | ORAL_TABLET | Freq: Once | ORAL | Status: AC
  Administered 2022-06-17: 1000 mg via ORAL
  Filled 2022-06-17: qty 2

## 2022-06-17 MED ORDER — SODIUM CHLORIDE 0.9 % IV SOLN: 1.0000 g | INTRAVENOUS | Status: DC

## 2022-06-17 MED ORDER — ONDANSETRON HCL 4 MG PO TABS: 4.0000 mg | ORAL_TABLET | Freq: Four times a day (QID) | ORAL | Status: DC | PRN

## 2022-06-17 MED ORDER — SODIUM CHLORIDE 0.9 % IV BOLUS (SEPSIS)
500.0000 mL | Freq: Once | INTRAVENOUS | Status: AC
  Administered 2022-06-17: 500 mL via INTRAVENOUS

## 2022-06-17 NOTE — ED Provider Notes (Signed)
[facility] Provider Note    Event Date/Time   First MD Initiated Contact with Patient 06/17/22 0206     (approximate)   History   Fall   HPI  Nicholas Gross is a 88 y.o. male with history of coronary artery disease, hypertension, hyperlipidemia who presents to the emergency department with his niece for multiple falls over the past several days.  He has had a head injury but unclear if there is loss of consciousness.  Not on blood thinners.  Patient complaining of right wrist and right hip pain.  States it is painful to ambulate and he has sharp shooting pains that go down his leg from his right hip.  No neck or back pain.  States that he thinks he is falling because he gets lightheaded.  No chest pain or shortness of breath, palpitations, recent fevers, cough, vomiting or diarrhea.  Family reports he has been on an antibiotic that starts with a C for several days due to a wound and infection to the left shin.  It appears on his records he was started on Ceftin twice a day for 10 days on December 19.   Niece reports she has a walker at home but does not use it.  History provided by patient and family.    Past Medical History:  Diagnosis Date   CAD (coronary artery disease)    History of GI bleed 2008   History of tobacco use    17 pack years, quit around 1970   Hyperlipidemia    Hypertension    Overweight     Past Surgical History:  Procedure Laterality Date   CAROTID ENDARTERECTOMY Left 2010   CATARACT EXTRACTION     cypher stent  09/12/02   s/p cypher stent mid-LAD   TENDON REPAIR  1991   finger and arm    MEDICATIONS:  Prior to Admission medications   Medication Sig Start Date End Date Taking? Authorizing Provider  cefUROXime (CEFTIN) 250 MG tablet Take 1 tablet (250 mg total) by mouth 2 (two) times daily with a meal for 10 days. 06/10/22 06/20/22  Gabriel Cirri, NP  cyanocobalamin (VITAMIN B12) 1000 MCG tablet Take 1,000 mcg by  mouth daily.    [provider]  gabapentin (NEURONTIN) 100 MG capsule Take 1 capsule (100 mg total) by mouth at bedtime as needed. 03/25/22   Johnson, Megan P, DO  lidocaine (LIDODERM) 5 % Place 1 patch onto the skin daily. Remove & Discard patch within 12 hours or as directed by MD 02/25/22   Olevia Perches P, DO  lisinopril (ZESTRIL) 5 MG tablet Take 1 tablet (5 mg total) by mouth 2 (two) times daily. 11/08/21   Johnson, Megan P, DO  Multiple Vitamins-Minerals (PRESERVISION AREDS PO) Take 1 tablet by mouth 2 (two) times daily.    [provider]  rosuvastatin (CRESTOR) 10 MG tablet TAKE 1 TABLET BY MOUTH ONCE DAILY 05/28/22   Dorcas Carrow, DO    Physical Exam   Triage Vital Signs: ED Triage Vitals  Enc Vitals Group     BP 06/16/22 1722 (!) 150/71     Pulse Rate 06/16/22 1722 79     Resp 06/16/22 1722 18     Temp 06/16/22 1722 99.2 F (37.3 C)     Temp src --      SpO2 06/16/22 1722 100 %     Weight 06/16/22 1724 152 lb 8.9 oz (69.2 kg)     Height  06/16/22 1724 5\' 9"  (1.753 m)     Head Circumference --      Peak Flow --      Pain Score 06/16/22 1723 0     Pain Loc --      Pain Edu? --      Excl. in GC? --     Most recent vital signs: Vitals:   06/16/22 1931 06/16/22 2342  BP: 122/74 (!) 178/79  Pulse: 84 69  Resp: 16 18  Temp:    SpO2: 98% 94%     CONSTITUTIONAL: Alert and oriented and responds appropriately to questions.  Elderly, in no distress HEAD: Normocephalic; abrasion to the posterior scalp EYES: Conjunctivae clear, PERRL, EOMI ENT: normal nose; no rhinorrhea; moist mucous membranes; pharynx without lesions noted; no dental injury; no septal hematoma, no epistaxis; no facial deformity or bony tenderness NECK: Supple, no midline spinal tenderness, step-off or deformity; trachea midline CARD: RRR; S1 and S2 appreciated; no murmurs, no clicks, no rubs, no gallops RESP: Normal chest excursion without splinting or tachypnea; breath sounds clear and  equal bilaterally; no wheezes, no rhonchi, no rales; no hypoxia or respiratory distress CHEST:  chest wall stable, no crepitus or ecchymosis or deformity, nontender to palpation; no flail chest ABD/GI: Normal bowel sounds; non-distended; soft, non-tender, no rebound, no guarding; no ecchymosis or other lesions noted PELVIS:  stable, no significant tenderness to palpation of the right hip and I am able to range it without significant difficulty BACK:  The back appears normal; no midline spinal tenderness, step-off or deformity EXT: Patient has increased calf swelling in the left leg compared to the right.  Both of his feet are cool to touch and I am not able to palpate a pulse but I am able to Doppler 2+ DP and PT pulses bilaterally.  He has redness without significant warmth to bilateral shins with superficial wounds noted to the distal shin.  No drainage, fluctuance or induration. SKIN: Normal color for age and race; warm NEURO: No facial asymmetry, normal speech, moving all extremities equally   LEFT leg   RIGHT leg  Patient gave verbal permission to utilize photo for medical documentation only. The image was not stored on any personal device.   ED Results / Procedures / Treatments   LABS: (all labs ordered are listed, but only abnormal results are displayed) Labs Reviewed  BASIC METABOLIC PANEL - Abnormal; Notable for the following components:      Result Value   Sodium 131 (*)    Chloride 97 (*)    Glucose, Bld 101 (*)    BUN 25 (*)    All other components within normal limits  CBC - Abnormal; Notable for the following components:   RBC 3.67 (*)    Hemoglobin 11.7 (*)    HCT 34.4 (*)    All other components within normal limits  URINALYSIS, ROUTINE W REFLEX MICROSCOPIC  PROCALCITONIN  TROPONIN I (HIGH SENSITIVITY)     EKG:  EKG Interpretation  Date/Time:  Monday June 16 2022 17:34:04 EST Ventricular Rate:  77 PR Interval:  172 QRS Duration: 88 QT  Interval:  346 QTC Calculation: 391 R Axis:   23 Text Interpretation: Normal sinus rhythm Normal ECG When compared with ECG of 12-Dec-2021 09:27, No significant change was found Confirmed by 14-Dec-2021 [phone]) on 06/17/2022 2:16:54 AM          RADIOLOGY: My personal review and interpretation of imaging: CT head and cervical spine show no traumatic injury.  Doppler shows no DVT of the left leg.  The CT of the right hip shows nondisplaced superior acetabular fracture.  I have personally reviewed all radiology reports. US Venous Img Lower Unilateral Left  Result Date: 06/17/2022 CLINICAL DATA:  Left leg swelling EXAM: Left LOWER EXTREMITY VENOUS DOPPLER ULTRASOUND TECHNIQUE: Gray-scale sonography with compression, as well as color and duplex ultrasound, were performed to evaluate the deep venous system(s) from the level of the common femoral vein through the popliteal and proximal calf veins. COMPARISON:  Lower extremity ultrasound 04/24/2022 FINDINGS: VENOUS Normal compressibility of the common femoral, superficial femoral, and popliteal veins, as well as the visualized calf veins. Visualized portions of profunda femoral vein and great saphenous vein unremarkable. No filling defects to suggest DVT on grayscale or color Doppler imaging. Doppler waveforms show normal direction of venous flow, normal respiratory plasticity and response to augmentation. Limited views of the contralateral common femoral vein are unremarkable. OTHER None. Limitations: none IMPRESSION: Negative. Electronically Signed   By: Minerva Fester M.D.   On: 06/17/2022 03:21   CT HEAD WO CONTRAST ( )  Result Date: 06/17/2022 CLINICAL DATA:  Trauma. EXAM: CT HEAD WITHOUT CONTRAST CT CERVICAL SPINE WITHOUT CONTRAST TECHNIQUE: Multidetector CT imaging of the head and cervical spine was performed following the standard protocol without intravenous contrast. Multiplanar CT image reconstructions of the cervical spine were also  generated. RADIATION DOSE REDUCTION: This exam was performed according to the departmental dose-optimization program which includes automated exposure control, adjustment of the mA and/or kV according to patient size and/or use of iterative reconstruction technique. COMPARISON:  CT dated 12/12/2021. FINDINGS: CT HEAD FINDINGS Brain: Mild age-related atrophy and chronic microvascular ischemic changes. There is no acute intracranial hemorrhage. No mass effect or midline shift. No extra-axial fluid collection. Vascular: No hyperdense vessel or unexpected calcification. Skull: Normal. Negative for fracture or focal lesion. Sinuses/Orbits: Diffuse mucoperiosteal thickening of paranasal sinuses with opacification of multiple ethmoid air cells. No air-fluid. The mastoid air cells are clear. Other: None CT CERVICAL SPINE FINDINGS Alignment: No acute subluxation. Skull base and vertebrae: No acute fracture.  Osteopenia. Soft tissues and spinal canal: No prevertebral fluid or swelling. No visible canal hematoma. Disc levels:  No acute findings.  Degenerative changes. Upper chest: Negative. Other: Bilateral carotid bulb calcified plaques. IMPRESSION: 1. No acute intracranial pathology. Mild age-related atrophy and chronic microvascular ischemic changes. 2. No acute/traumatic cervical spine pathology. Electronically Signed   By: Elgie Collard M.D.   On: 06/17/2022 03:16   CT Cervical Spine Wo Contrast  Result Date: 06/17/2022 CLINICAL DATA:  Trauma. EXAM: CT HEAD WITHOUT CONTRAST CT CERVICAL SPINE WITHOUT CONTRAST TECHNIQUE: Multidetector CT imaging of the head and cervical spine was performed following the standard protocol without intravenous contrast. Multiplanar CT image reconstructions of the cervical spine were also generated. RADIATION DOSE REDUCTION: This exam was performed according to the departmental dose-optimization program which includes automated exposure control, adjustment of the mA and/or kV according  to patient size and/or use of iterative reconstruction technique. COMPARISON:  CT dated 12/12/2021. FINDINGS: CT HEAD FINDINGS Brain: Mild age-related atrophy and chronic microvascular ischemic changes. There is no acute intracranial hemorrhage. No mass effect or midline shift. No extra-axial fluid collection. Vascular: No hyperdense vessel or unexpected calcification. Skull: Normal. Negative for fracture or focal lesion. Sinuses/Orbits: Diffuse mucoperiosteal thickening of paranasal sinuses with opacification of multiple ethmoid air cells. No air-fluid. The mastoid air cells are clear. Other: None CT CERVICAL SPINE FINDINGS Alignment: No acute subluxation. Skull base and  vertebrae: No acute fracture.  Osteopenia. Soft tissues and spinal canal: No prevertebral fluid or swelling. No visible canal hematoma. Disc levels:  No acute findings.  Degenerative changes. Upper chest: Negative. Other: Bilateral carotid bulb calcified plaques. IMPRESSION: 1. No acute intracranial pathology. Mild age-related atrophy and chronic microvascular ischemic changes. 2. No acute/traumatic cervical spine pathology. Electronically Signed   By: Elgie Collard M.D.   On: 06/17/2022 03:16   CT Hip Right Wo Contrast  Result Date: 06/17/2022 CLINICAL DATA:  Multiple falls in the past week EXAM: CT OF THE RIGHT HIP WITHOUT CONTRAST TECHNIQUE: Multidetector CT imaging of the right hip was performed according to the standard protocol. Multiplanar CT image reconstructions were also generated. RADIATION DOSE REDUCTION: This exam was performed according to the departmental dose-optimization program which includes automated exposure control, adjustment of the mA and/or kV according to patient size and/or use of iterative reconstruction technique. COMPARISON:  Radiographs 06/16/2022 and CT of the right hip 12/12/2021 FINDINGS: Bones/Joint/Cartilage Probable nondisplaced fracture of the anterior superior acetabulum (series 2/image 47 and series  7/image 50). Demineralization. Ligaments Suboptimally assessed by CT. Muscles and Tendons No acute abnormality. Soft tissues Subcutaneous edema over the greater trochanter.  Distended bladder. IMPRESSION: Probable nondisplaced fracture of the anterior superior acetabulum Electronically Signed   By: Minerva Fester M.D.   On: 06/17/2022 03:08   DG Wrist Complete Right  Result Date: 06/16/2022 CLINICAL DATA:  Fall, right wrist pain EXAM: RIGHT WRIST - COMPLETE 3+ VIEW COMPARISON:  None Available. FINDINGS: Normal alignment. No acute fracture or dislocation. Mild degenerate arthritis of the first carpometacarpal joint at the base of the thumb. Remaining joint spaces are preserved. Mild soft tissue swelling dorsal to the distal radius. IMPRESSION: 1. Soft tissue swelling. No acute fracture or dislocation. Electronically Signed   By: Helyn Numbers M.D.   On: 06/16/2022 18:07   DG Hip Unilat  With Pelvis 2-3 Views Right  Result Date: 06/16/2022 CLINICAL DATA:  Multiple falls, right hip pain EXAM: DG HIP (WITH OR WITHOUT PELVIS) 2-3V RIGHT COMPARISON:  None Available. FINDINGS: Normal alignment. No acute fracture or dislocation. Mild bilateral degenerative hip arthritis. Vascular calcifications are seen within the pelvis and medial thighs bilaterally. IMPRESSION: 1. No acute fracture or dislocation. Electronically Signed   By: Helyn Numbers M.D.   On: 06/16/2022 18:04     PROCEDURES:  Critical Care performed: No      Procedures    IMPRESSION / MDM / ASSESSMENT AND PLAN / ED COURSE  I reviewed the triage vital signs and the nursing notes.  Patient here with multiple falls, dizziness.  Appears to have bilateral lower extremity cellulitis and is on antibiotics as an outpatient.  The patient is on the cardiac monitor to evaluate for evidence of arrhythmia and/or significant heart rate changes.   DIFFERENTIAL DIAGNOSIS (includes but not limited to):   Dehydration, anemia, electrolyte  derangement, CVA, intracranial hemorrhage, dementia, ACS, PE, DVT, cellulitis  Patient's presentation is most consistent with acute presentation with potential threat to life or bodily function.  PLAN: Workup initiated from triage.  No leukocytosis.  Mild anemia which is stable.  Reassuring electrolytes, renal function.  Will add on troponin, procalcitonin.  Will obtain CT of the head and cervical spine.  X-rays of the wrist and hip reviewed and interpreted by myself and the radiologist and show no acute abnormality.  Niece is concerned that he complains of significant pain when he bears weight on this hip although there is no leg  length discrepancy and I am able to range it without difficulty or signs of pain.  Will obtain CT of the hip.  I do not feel there is any indication for an MRI of the hip at this time.  He does have cellulitis of his bilateral lower extremities.  Will give Rocephin and vancomycin.  Patient may need admission to the hospital given multiple falls over the past few days.   MEDICATIONS GIVEN IN ED: Medications  acetaminophen (TYLENOL) tablet 1,000 mg (has no administration in time range)  sodium chloride 0.9 % bolus 500 mL (has no administration in time range)  cefTRIAXone (ROCEPHIN) 2 g in sodium chloride 0.9 % 100 mL IVPB (has no administration in time range)  Tdap (BOOSTRIX) injection 0.5 mL (has no administration in time range)  bacitracin ointment (has no administration in time range)  vancomycin (VANCOREADY) IVPB 1500 mg/300 mL (has no administration in time range)     ED COURSE: CTs of the head and cervical spine reviewed and interpreted by myself and the radiologist and show no traumatic injury.  CT of the hip also reviewed and interpreted by myself and the radiologist and shows nondisplaced superior right acetabular fracture.  Discussed with Dr. Odis LusterBowers with orthopedics who will see patient in consult.  Will keep him n.p.o. at this time and  nonweightbearing.  Ultrasound of the left leg shows no DVT.  He is getting antibiotics for his bilateral lower extremity cellulitis worse on the left.  Will discuss with hospitalist for admission.   CONSULTS:  Consulted and discussed patient's case with hospitalist, Dr. Para Marchuncan.  I have recommended admission and consulting physician agrees and will place admission orders.  Patient (and family if present) agree with this plan.   I reviewed all nursing notes, vitals, pertinent previous records.  All labs, EKGs, imaging ordered have been independently reviewed and interpreted by myself.    OUTSIDE RECORDS REVIEWED: Reviewed patient's last neurology note on 06/09/2022 where he was seen by Dr. Malvin JohnsPotter for involuntary movements, essential tremor, imbalance.  Recently started on gabapentin.       FINAL CLINICAL IMPRESSION(S) / ED DIAGNOSES   Final diagnoses:  Multiple falls  Closed nondisplaced fracture of right acetabulum, unspecified portion of acetabulum, initial encounter (HCC)  Cellulitis of right lower extremity  Cellulitis of left lower extremity     Rx / DC Orders   ED Discharge Orders     None        Note:  This document was prepared using Dragon voice recognition software and may include unintentional dictation errors.   Saket Hellstrom, Layla MawKristen N, DO 06/17/22 [phone]

## 2022-06-17 NOTE — H&P (Addendum)
History and Physical    Patient: Nicholas Gross:[id number] DOB: December 22, 1927 DOA: 06/17/2022 DOS: the patient was seen and examined on 06/17/2022 PCP: Dorcas Carrow, DO  Patient coming from: Home  Chief Complaint:  Chief Complaint  Patient presents with   Fall    HPI: Nicholas Gross is a 88 y.o. male with medical history significant for hypertension, hyperlipidemia, CAD, carotid atherosclerosis, PAD, frequent falls for which he was hospitalized in June 2023 and ruled out for stroke, subsequently followed by neurology last seen 12/18 with working diagnosis of muscle spasm/trauma/imbalance for which she was started on gabapentin who presents to the ED after experiencing 3 falls in the past few days.  Patient reports dizziness on standing.  He has chronic swelling bilateral lower extremities with recurrent cellulitis and saw his doctor on 12/19 when he was started on Keflex and referred to the wound care clinic.  He has had no cough or shortness of breath or chest pain fever or chills. ED course and data review: Tmax 99.2 with BP 150/71 and otherwise normal vitals.  CBC and BMP unremarkable except for mild hyponatremia of 131.  EKG, personally viewed and interpreted showing NSR with no acute ST-T wave changes. CT hip shows a probable nondisplaced fracture of the anterior superior acetabulum on the right. Other trauma imaging including x-ray right wrist, CT head and C-spine negative Left lower extremity venous ultrasound negative for DVT ED provider: Spoke with orthopedist Dr. Odis Luster who will see patient in the a.m.  Patient was started on Rocephin for cellulitis and hospitalist consulted for admission.   Review of Systems: As mentioned in the history of present illness. All other systems reviewed and are negative.  Past Medical History:  Diagnosis Date   CAD (coronary artery disease)    History of GI bleed 2008   History of tobacco use    17 pack years, quit around 1970    Hyperlipidemia    Hypertension    Overweight    Past Surgical History:  Procedure Laterality Date   CAROTID ENDARTERECTOMY Left 2010   CATARACT EXTRACTION     cypher stent  09/12/02   s/p cypher stent mid-LAD   TENDON REPAIR  1991   finger and arm   Social History:  reports that he quit smoking about 54 years ago. His smoking use included cigarettes. He has a 17.00 pack-year smoking history. He has never used smokeless tobacco. He reports that he does not drink alcohol and does not use drugs.  Allergies  Allergen Reactions   Other     Patient states that he is allergic to something that they place in the IV before they work on you   Simvastatin Other (See Comments)    Myalgia     Family History  Problem Relation Age of Onset   Heart disease Father        possibly   Cancer Sister        breast   AAA (abdominal aortic aneurysm) Brother    Cancer Sister        lung    Prior to Admission medications   Medication Sig Start Date End Date Taking? Authorizing Provider  cefUROXime (CEFTIN) 250 MG tablet Take 1 tablet (250 mg total) by mouth 2 (two) times daily with a meal for 10 days. 06/10/22 06/20/22  Gabriel Cirri, NP  cyanocobalamin (VITAMIN B12) 1000 MCG tablet Take 1,000 mcg by mouth daily.    [provider]  gabapentin (NEURONTIN) 100 MG  capsule Take 1 capsule (100 mg total) by mouth at bedtime as needed. 03/25/22   Johnson, Megan P, DO  lidocaine (LIDODERM) 5 % Place 1 patch onto the skin daily. Remove & Discard patch within 12 hours or as directed by MD 02/25/22   Olevia Perches P, DO  lisinopril (ZESTRIL) 5 MG tablet Take 1 tablet (5 mg total) by mouth 2 (two) times daily. 11/08/21   Johnson, Megan P, DO  Multiple Vitamins-Minerals (PRESERVISION AREDS PO) Take 1 tablet by mouth 2 (two) times daily.    [provider]  rosuvastatin (CRESTOR) 10 MG tablet TAKE 1 TABLET BY MOUTH ONCE DAILY 05/28/22   Dorcas Carrow, DO    Physical Exam: Vitals:   06/16/22  1722 06/16/22 1724 06/16/22 1931 06/16/22 2342  BP: (!) 150/71  122/74 (!) 178/79  Pulse: 79  84 69  Resp: 18  16 18   Temp: 99.2 F (37.3 C)     SpO2: 100%  98% 94%  Weight:  69.2 kg    Height:  5\' 9"  (1.753 m)     Physical Exam Vitals and nursing note reviewed.  Constitutional:      General: He is not in acute distress. HENT:     Head: Normocephalic and atraumatic.  Cardiovascular:     Rate and Rhythm: Normal rate and regular rhythm.     Heart sounds: Normal heart sounds.  Pulmonary:     Effort: Pulmonary effort is normal.     Breath sounds: Normal breath sounds.  Abdominal:     Palpations: Abdomen is soft.     Tenderness: There is no abdominal tenderness.  Neurological:     General: No focal deficit present.     Labs on Admission: I have personally reviewed following labs and imaging studies  CBC: Recent Labs  Lab 06/16/22 1730  WBC 7.5  HGB 11.7*  HCT 34.4*  MCV 93.7  PLT 224   Basic Metabolic Panel: Recent Labs  Lab 06/16/22 1730  NA 131*  K 3.9  CL 97*  CO2 25  GLUCOSE 101*  BUN 25*  CREATININE 1.09  CALCIUM 9.1   GFR: Estimated Creatinine Clearance: 42.3 mL/min (by C-G formula based on SCr of 1.09 mg/dL). Liver Function Tests: No results for input(s): "AST", "ALT", "ALKPHOS", "BILITOT", "PROT", "ALBUMIN" in the last 168 hours. No results for input(s): "LIPASE", "AMYLASE" in the last 168 hours. No results for input(s): "AMMONIA" in the last 168 hours. Coagulation Profile: No results for input(s): "INR", "PROTIME" in the last 168 hours. Cardiac Enzymes: No results for input(s): "CKTOTAL", "CKMB", "CKMBINDEX", "TROPONINI" in the last 168 hours. BNP (last 3 results) No results for input(s): "PROBNP" in the last 8760 hours. HbA1C: No results for input(s): "HGBA1C" in the last 72 hours. CBG: No results for input(s): "GLUCAP" in the last 168 hours. Lipid Profile: No results for input(s): "CHOL", "HDL", "LDLCALC", "TRIG", "CHOLHDL", "LDLDIRECT" in  the last 72 hours. Thyroid Function Tests: No results for input(s): "TSH", "T4TOTAL", "FREET4", "T3FREE", "THYROIDAB" in the last 72 hours. Anemia Panel: No results for input(s): "VITAMINB12", "FOLATE", "FERRITIN", "TIBC", "IRON", "RETICCTPCT" in the last 72 hours. Urine analysis:    Component Value Date/Time   COLORURINE YELLOW (A) 12/12/2021 1114   APPEARANCEUR CLEAR (A) 12/12/2021 1114   APPEARANCEUR Clear 11/08/2021 1007   LABSPEC 1.006 12/12/2021 1114   PHURINE 7.0 12/12/2021 1114   GLUCOSEU NEGATIVE 12/12/2021 1114   HGBUR NEGATIVE 12/12/2021 1114   BILIRUBINUR NEGATIVE 12/12/2021 1114   BILIRUBINUR Negative 11/08/2021  1007   KETONESUR NEGATIVE 12/12/2021 1114   PROTEINUR NEGATIVE 12/12/2021 1114   NITRITE NEGATIVE 12/12/2021 1114   LEUKOCYTESUR NEGATIVE 12/12/2021 1114    Radiological Exams on Admission: US Venous Img Lower Unilateral Left  Result Date: 06/17/2022 CLINICAL DATA:  Left leg swelling EXAM: Left LOWER EXTREMITY VENOUS DOPPLER ULTRASOUND TECHNIQUE: Gray-scale sonography with compression, as well as color and duplex ultrasound, were performed to evaluate the deep venous system(s) from the level of the common femoral vein through the popliteal and proximal calf veins. COMPARISON:  Lower extremity ultrasound 04/24/2022 FINDINGS: VENOUS Normal compressibility of the common femoral, superficial femoral, and popliteal veins, as well as the visualized calf veins. Visualized portions of profunda femoral vein and great saphenous vein unremarkable. No filling defects to suggest DVT on grayscale or color Doppler imaging. Doppler waveforms show normal direction of venous flow, normal respiratory plasticity and response to augmentation. Limited views of the contralateral common femoral vein are unremarkable. OTHER None. Limitations: none IMPRESSION: Negative. Electronically Signed   By: Minerva Festeryler  Stutzman M.D.   On: 06/17/2022 03:21   CT HEAD WO CONTRAST (5MM)  Result Date:  06/17/2022 CLINICAL DATA:  Trauma. EXAM: CT HEAD WITHOUT CONTRAST CT CERVICAL SPINE WITHOUT CONTRAST TECHNIQUE: Multidetector CT imaging of the head and cervical spine was performed following the standard protocol without intravenous contrast. Multiplanar CT image reconstructions of the cervical spine were also generated. RADIATION DOSE REDUCTION: This exam was performed according to the departmental dose-optimization program which includes automated exposure control, adjustment of the mA and/or kV according to patient size and/or use of iterative reconstruction technique. COMPARISON:  CT dated 12/12/2021. FINDINGS: CT HEAD FINDINGS Brain: Mild age-related atrophy and chronic microvascular ischemic changes. There is no acute intracranial hemorrhage. No mass effect or midline shift. No extra-axial fluid collection. Vascular: No hyperdense vessel or unexpected calcification. Skull: Normal. Negative for fracture or focal lesion. Sinuses/Orbits: Diffuse mucoperiosteal thickening of paranasal sinuses with opacification of multiple ethmoid air cells. No air-fluid. The mastoid air cells are clear. Other: None CT CERVICAL SPINE FINDINGS Alignment: No acute subluxation. Skull base and vertebrae: No acute fracture.  Osteopenia. Soft tissues and spinal canal: No prevertebral fluid or swelling. No visible canal hematoma. Disc levels:  No acute findings.  Degenerative changes. Upper chest: Negative. Other: Bilateral carotid bulb calcified plaques. IMPRESSION: 1. No acute intracranial pathology. Mild age-related atrophy and chronic microvascular ischemic changes. 2. No acute/traumatic cervical spine pathology. Electronically Signed   By: Elgie CollardArash  Radparvar M.D.   On: 06/17/2022 03:16   CT Cervical Spine Wo Contrast  Result Date: 06/17/2022 CLINICAL DATA:  Trauma. EXAM: CT HEAD WITHOUT CONTRAST CT CERVICAL SPINE WITHOUT CONTRAST TECHNIQUE: Multidetector CT imaging of the head and cervical spine was performed following the  standard protocol without intravenous contrast. Multiplanar CT image reconstructions of the cervical spine were also generated. RADIATION DOSE REDUCTION: This exam was performed according to the departmental dose-optimization program which includes automated exposure control, adjustment of the mA and/or kV according to patient size and/or use of iterative reconstruction technique. COMPARISON:  CT dated 12/12/2021. FINDINGS: CT HEAD FINDINGS Brain: Mild age-related atrophy and chronic microvascular ischemic changes. There is no acute intracranial hemorrhage. No mass effect or midline shift. No extra-axial fluid collection. Vascular: No hyperdense vessel or unexpected calcification. Skull: Normal. Negative for fracture or focal lesion. Sinuses/Orbits: Diffuse mucoperiosteal thickening of paranasal sinuses with opacification of multiple ethmoid air cells. No air-fluid. The mastoid air cells are clear. Other: None CT CERVICAL SPINE FINDINGS Alignment: No acute  subluxation. Skull base and vertebrae: No acute fracture.  Osteopenia. Soft tissues and spinal canal: No prevertebral fluid or swelling. No visible canal hematoma. Disc levels:  No acute findings.  Degenerative changes. Upper chest: Negative. Other: Bilateral carotid bulb calcified plaques. IMPRESSION: 1. No acute intracranial pathology. Mild age-related atrophy and chronic microvascular ischemic changes. 2. No acute/traumatic cervical spine pathology. Electronically Signed   By: Elgie Collard M.D.   On: 06/17/2022 03:16   CT Hip Right Wo Contrast  Result Date: 06/17/2022 CLINICAL DATA:  Multiple falls in the past week EXAM: CT OF THE RIGHT HIP WITHOUT CONTRAST TECHNIQUE: Multidetector CT imaging of the right hip was performed according to the standard protocol. Multiplanar CT image reconstructions were also generated. RADIATION DOSE REDUCTION: This exam was performed according to the departmental dose-optimization program which includes automated exposure  control, adjustment of the mA and/or kV according to patient size and/or use of iterative reconstruction technique. COMPARISON:  Radiographs 06/16/2022 and CT of the right hip 12/12/2021 FINDINGS: Bones/Joint/Cartilage Probable nondisplaced fracture of the anterior superior acetabulum (series 2/image 47 and series 7/image 50). Demineralization. Ligaments Suboptimally assessed by CT. Muscles and Tendons No acute abnormality. Soft tissues Subcutaneous edema over the greater trochanter.  Distended bladder. IMPRESSION: Probable nondisplaced fracture of the anterior superior acetabulum Electronically Signed   By: Minerva Fester M.D.   On: 06/17/2022 03:08   DG Wrist Complete Right  Result Date: 06/16/2022 CLINICAL DATA:  Fall, right wrist pain EXAM: RIGHT WRIST - COMPLETE 3+ VIEW COMPARISON:  None Available. FINDINGS: Normal alignment. No acute fracture or dislocation. Mild degenerate arthritis of the first carpometacarpal joint at the base of the thumb. Remaining joint spaces are preserved. Mild soft tissue swelling dorsal to the distal radius. IMPRESSION: 1. Soft tissue swelling. No acute fracture or dislocation. Electronically Signed   By: Helyn Numbers M.D.   On: 06/16/2022 18:07   DG Hip Unilat  With Pelvis 2-3 Views Right  Result Date: 06/16/2022 CLINICAL DATA:  Multiple falls, right hip pain EXAM: DG HIP (WITH OR WITHOUT PELVIS) 2-3V RIGHT COMPARISON:  None Available. FINDINGS: Normal alignment. No acute fracture or dislocation. Mild bilateral degenerative hip arthritis. Vascular calcifications are seen within the pelvis and medial thighs bilaterally. IMPRESSION: 1. No acute fracture or dislocation. Electronically Signed   By: Helyn Numbers M.D.   On: 06/16/2022 18:04     Data Reviewed: Relevant notes from primary care and specialist visits, past discharge summaries as available in EHR, including Care Everywhere. Prior diagnostic testing as pertinent to current admission diagnoses Updated  medications and problem lists for reconciliation ED course, including vitals, labs, imaging, treatment and response to treatment Triage notes, nursing and pharmacy notes and ED provider's notes Notable results as noted in HPI   Assessment and Plan: * Closed right acetabular fracture [facility]) Orthopedics consult Pain control Will keep n.p.o. and SCDs for DVT prophylaxis  Frequent falls Gait imbalance Patient with history of frequent falls recently started on gabapentin by his neurologist for muscle spasms and gait imbalance Fall could be multifactorial related to gait imbalance, gabapentin Low suspicion for stroke as exam is nonfocal.  Patient hospitalized in June 2023 with frequent falls with negative stroke workup Head CT negative We will keep on telemetry PT eval Consider neurology consult  Cellulitis of both lower extremities Rocephin Wound care  PAD (peripheral artery disease) (HCC) History of carotid endarterectomy Continue aspirin and rosuvastatin Distal pulses were dopplerable in the ED  CAD (coronary artery disease) No complaints of  chest pain and EKG nonacute Continue lisinopril, rosuvastatin and aspirin  Hypertension Continue lisinopril        DVT prophylaxis: SCD  Consults: Orthopedics Dr. Odis Luster  Advance Care Planning:   Code Status: Prior   Family Communication: none  Disposition Plan: Back to previous home environment  Severity of Illness: The appropriate patient status for this patient is INPATIENT. Inpatient status is judged to be reasonable and necessary in order to provide the required intensity of service to ensure the patient's safety. The patient's presenting symptoms, physical exam findings, and initial radiographic and laboratory data in the context of their chronic comorbidities is felt to place them at high risk for further clinical deterioration. Furthermore, it is not anticipated that the patient will be medically stable for discharge  from the hospital within 2 midnights of admission.   * I certify that at the point of admission it is my clinical judgment that the patient will require inpatient hospital care spanning beyond 2 midnights from the point of admission due to high intensity of service, high risk for further deterioration and high frequency of surveillance required.*  Author: Andris Baumann, MD 06/17/2022 3:49 AM  For on call review [email or web address].

## 2022-06-17 NOTE — Progress Notes (Signed)
Same-day note: Patient interviewed and examined.  Denies any pain in the legs or the hips.  He has been evaluated by the orthopedic team in regards to his nondisplaced right anterior/superior acetabular fracture.  Recommended toe-touch weightbearing as tolerated on the right and PT.  He is to follow-up with the orthopedic service 12 to 14 days after discharge for outpatient x-ray.  He lives alone.  Currently he is afebrile and is hemodynamically stable.  Findings of cellulitis on lower extremities so I will discontinue antibiotic coverage.

## 2022-06-17 NOTE — Progress Notes (Signed)
PHARMACY -  BRIEF ANTIBIOTIC NOTE   Pharmacy has received consult(s) for Vancomycin from an ED provider.  The patient's profile has been reviewed for ht/wt/allergies/indication/available labs.    One time order(s) placed for Vancomycin 1500 mg per pt wt: 69.2 kg  Further antibiotics/pharmacy consults should be ordered by admitting physician if indicated.                       Thank you, Otelia Sergeant, PharmD, [facility] 06/17/2022 2:34 AM

## 2022-06-17 NOTE — Assessment & Plan Note (Signed)
History of carotid endarterectomy Continue aspirin and rosuvastatin Distal pulses were dopplerable in the ED

## 2022-06-17 NOTE — Assessment & Plan Note (Signed)
Rocephin Wound care

## 2022-06-17 NOTE — ED Notes (Signed)
[name] daughter up to desk very upset regarding wait. Explanation of wait and triage acuity process provided to daughter who does not understand and states "you don't tell people to go somewhere else". Informed daughter that emtala laws exist and I am not able to legally tell people to go other places. Daughter declines to listen to further explanations states "you just have every excuse in the book". Attempted to call charge, no answer, no answer when attempting to call nursing supervisor for assist.

## 2022-06-17 NOTE — ED Notes (Signed)
89 yom lying supine in the bed. The pt was alert to his name and some questions. The pt advised he needed to have a bowel movement as well as urinate. The pt advised he wanted to go to the toilet. The pt was told he could not go to the toilet due his current hip fx and frequent falls. ED tech Felicia assisted with rolling the pt to his left sided and placing him on a chuck and bed pan. The pt was also assisted with a urinal. The pt used the urinal and voided 400 cc's.

## 2022-06-17 NOTE — Evaluation (Signed)
Physical Therapy Evaluation Patient Details Name: Nicholas Gross MRN: [id number] DOB: 04/06/1928 Today's Date: 06/17/2022  History of Present Illness  Nicholas Gross is a 88 y.o. male who complains of right hip pain after fall. The pain is sharp in character. The pain is severe and 2/10. The pain is worse with movement and better with rest. Ortho assessment and plan Assessment:  Right anterior/superior acetabular fracture     Plan:  Fracture is non-displaced. His exam is benign. Recommend WBAT on the right and physical therapy. He may follow-up in 12 to 14 days after discharge for outpatient x-ray. Continue PT.  Clinical Impression  Pt received in stretcher in the ED hallway agreeable to PT evaluation. Pt stated that he would like to return home. Pt lives alone and was Ind at household level and community level ambulation. Neighbor helps with shopping and appointments. Pt Ind with ADLs and simple cooking. Today's assessment reveals pt needs Min guard for transfers and ambulation WBAT to RLE  using  FWW. Pt reported of pain with movement which subsided at rest. [name] balance is steady with the use of  AD. Pt will benefit from continued PT while in acute care to attempt stairs and will benefit from FWW, BSC 3-1, HHPT and HH aide 5-7 x week for a safe discharge home.      Recommendations for follow up therapy are one component of a multi-disciplinary discharge planning process, led by the attending physician.  Recommendations may be updated based on patient status, additional functional criteria and insurance authorization.  Follow Up Recommendations Home health PT (with Home health Aide 7 x week)      Assistance Recommended at Discharge Set up Supervision/Assistance  Patient can return home with the following  A little help with walking and/or transfers;A little help with bathing/dressing/bathroom;Assistance with cooking/housework;Direct supervision/assist for medications management;Assist for  transportation;Help with stairs or ramp for entrance    Equipment Recommendations Rolling walker (2 wheels);BSC/3in1  Recommendations for Other Services       Functional Status Assessment Patient has had a recent decline in their functional status and demonstrates the ability to make significant improvements in function in a reasonable and predictable amount of time.     Precautions / Restrictions Precautions Precautions: Fall Restrictions Weight Bearing Restrictions: Yes RLE Weight Bearing: Weight bearing as tolerated      Mobility  Bed Mobility Overal bed mobility: Modified Independent             General bed mobility comments: slow due otpain with movement and generalized weakness.    Transfers Overall transfer level: Needs assistance Equipment used: Rolling walker (2 wheels) Transfers: Sit to/from Stand Sit to Stand: Min guard                Ambulation/Gait Ambulation/Gait assistance: Min guard Gait Distance (Feet): 40 Feet Assistive device: Rolling walker (2 wheels) Gait Pattern/deviations: Step-to pattern, Antalgic, Trunk flexed Gait velocity: dec     General Gait Details: decreased heel strike and loading resposne.  Stairs: N/T             Wheelchair Mobility    Modified Rankin (Stroke Patients Only)       Balance Overall balance assessment: Needs assistance Sitting-balance support: No upper extremity supported, Feet unsupported Sitting balance-[name]: Normal     Standing balance support: Bilateral upper extremity supported Standing balance-[name]: Good Standing balance comment: SLow and steady without LOB.  Pertinent Vitals/Pain Pain Assessment Pain Assessment: No/denies pain    Home Living Family/patient expects to be discharged to:: Private residence Living Arrangements: Alone Available Help at Discharge: Neighbor Type of Home: Mobile home Home Access: Stairs to  enter Entrance Stairs-Rails: Can reach both Entrance Stairs-Number of Steps: 5STE   Home Layout: One level Home Equipment: Cane - single point;Cane - quad      Prior Function Prior Level of Function : Independent/Modified Independent;Needs assist;Patient poor historian/Family not available;History of Falls (last six months)             Mobility Comments: Pt ambualted at household level with FWW independently. Ambualted to car and in the grocery stores independnetly. Neighbor drove him. ADLs Comments: Independent     Hand Dominance   Dominant Hand: Right    Extremity/Trunk Assessment   Upper Extremity Assessment Upper Extremity Assessment: Overall WFL for tasks assessed    Lower Extremity Assessment Lower Extremity Assessment: Generalized weakness;RLE deficits/detail RLE Deficits / Details: weak RLE RLE: Unable to fully assess due to pain RLE Sensation: WNL RLE Coordination: WNL       Communication   Communication: No difficulties (weak pohnics.)  Cognition Arousal/Alertness: Awake/alert Behavior During Therapy: WFL for tasks assessed/performed Overall Cognitive Status: Within Functional Limits for tasks assessed                                          General Comments      Exercises     Assessment/Plan    PT Assessment Patient needs continued PT services  PT Problem List Decreased strength;Decreased range of motion;Decreased activity tolerance;Decreased mobility;Decreased knowledge of precautions;Pain       PT Treatment Interventions Gait training;Stair training;Functional mobility training;Therapeutic activities;Therapeutic exercise;Balance training;Neuromuscular re-education;Cognitive remediation;Patient/family education    PT Goals (Current goals can be found in the Care Plan section)  Acute Rehab PT Goals Patient Stated Goal: " I want to go home with help." PT Goal Formulation: With patient Time For Goal Achievement:  07/01/22 Potential to Achieve Goals: Fair    Frequency 7X/week     Co-evaluation               AM-PAC PT "6 Clicks" Mobility  Outcome Measure Help needed turning from your back to your side while in a flat bed without using bedrails?: None Help needed moving from lying on your back to sitting on the side of a flat bed without using bedrails?: None Help needed moving to and from a bed to a chair (including a wheelchair)?: A Little Help needed standing up from a chair using your arms (e.g., wheelchair or bedside chair)?: A Little Help needed to walk in hospital room?: A Little Help needed climbing 3-5 steps with a railing? : A Lot 6 Click Score: 19    End of Session Equipment Utilized During Treatment: Gait belt Activity Tolerance: Patient tolerated treatment well Patient left: Other (comment) (stretcher in the ED hallway with family by his side.) Nurse Communication: Mobility status;Precautions PT Visit Diagnosis: Muscle weakness (generalized) (M62.81);Difficulty in walking, not elsewhere classified (R26.2);Pain Pain - Right/Left: Right Pain - part of body: Hip    Time: 0335-0358 PT Time Calculation (min) (ACUTE ONLY): 23 min   Charges:   PT Evaluation $PT Eval Low Complexity: 1 Low PT Treatments $Gait Training: 8-22 mins       Aleyza Salmi PT DPT 5:03 PM,06/17/22

## 2022-06-17 NOTE — Assessment & Plan Note (Signed)
Gait imbalance Patient with history of frequent falls recently started on gabapentin by his neurologist for muscle spasms and gait imbalance Fall could be multifactorial related to gait imbalance, gabapentin Low suspicion for stroke as exam is nonfocal.  Patient hospitalized in June 2023 with frequent falls with negative stroke workup Head CT negative We will keep on telemetry PT eval Consider neurology consult

## 2022-06-17 NOTE — Progress Notes (Addendum)
Patient insisting on walking to restroom/toilet to urinate, but refusing to urinate with staff in the room. Explained safety precautions and fall risk measures; patient unreceptive to this education. Patient offered urinal and proceeded to throw it at staff. Patient finally agreeable to stand at bedside with staff "turning their backs" and use the urinal. Patient almost fell and staff able to guide him back to sitting on the bed. Patient did finally void. Will continue to monitor. Bed alarm on, fall mats in place, frequent visual checks and door remains open at this time. Patient is close to the nurses station.

## 2022-06-17 NOTE — Assessment & Plan Note (Signed)
Continue lisinopril

## 2022-06-17 NOTE — Consult Note (Addendum)
ORTHOPAEDIC CONSULTATION  REQUESTING PHYSICIAN: Russella Dar, NP  Chief Complaint: right hip pain  HPI: Nicholas Gross is a 88 y.o. male who complains of right hip pain after fall. The pain is sharp in character. The pain is severe and 2/10. The pain is worse with movement and better with rest. Denies any numbness, tingling or constitutional symptoms.  Past Medical History:  Diagnosis Date   CAD (coronary artery disease)    History of GI bleed 2008   History of tobacco use    17 pack years, quit around 1970   Hyperlipidemia    Hypertension    Overweight    Past Surgical History:  Procedure Laterality Date   CAROTID ENDARTERECTOMY Left 2010   CATARACT EXTRACTION     cypher stent  09/12/02   s/p cypher stent mid-LAD   TENDON REPAIR  1991   finger and arm   Social History   Socioeconomic History   Marital status: Widowed    Spouse name: Not on file   Number of children: Not on file   Years of education: some college in [employer]    Highest education level: High school graduate  Occupational History   Occupation: retired   Tobacco Use   Smoking status: Former    Packs/day: 1.00    Years: 17.00    Total pack years: 17.00    Types: Cigarettes    Quit date: 06/23/1968    Years since quitting: 54.0   Smokeless tobacco: Never  Vaping Use   Vaping Use: Never used  Substance and Sexual Activity   Alcohol use: No   Drug use: No   Sexual activity: Not Currently  Other Topics Concern   Not on file  Social History Narrative   Attends church, neighbor take him    Social Determinants of Health   Financial Resource Strain: Low Risk  (05/13/2021)   Overall Financial Resource Strain (CARDIA)    Difficulty of Paying Living Expenses: Not hard at all  Food Insecurity: No Food Insecurity (05/13/2021)   Hunger Vital Sign    Worried About Running Out of Food in the Last Year: Never true    Ran Out of Food in the Last Year: Never true  Transportation Needs: No Transportation  Needs (05/13/2021)   PRAPARE - Administrator, Civil Service (Medical): No    Lack of Transportation (Non-Medical): No  Physical Activity: Unknown (05/13/2021)   Exercise Vital Sign    Days of Exercise per Week: 2 days    Minutes of Exercise per Session: Not on file  Stress: No Stress Concern Present (05/13/2021)   [employer] of Occupational Health - Occupational Stress Questionnaire    Feeling of Stress : Not at all  Social Connections: Moderately Isolated (05/13/2021)   Social Connection and Isolation Panel [NHANES]    Frequency of Communication with Friends and Family: Once a week    Frequency of Social Gatherings with Friends and Family: Twice a week    Attends Religious Services: More than 4 times per year    Active Member of [employer] or Organizations: No    Attends Banker Meetings: Never    Marital Status: Widowed   Family History  Problem Relation Age of Onset   Heart disease Father        possibly   Cancer Sister        breast   AAA (abdominal aortic aneurysm) Brother    Cancer Sister  lung   Allergies  Allergen Reactions   Other     Patient states that he is allergic to something that they place in the IV before they work on you   Simvastatin Other (See Comments)    Myalgia    Prior to Admission medications   Medication Sig Start Date End Date Taking? Authorizing Provider  cefUROXime (CEFTIN) 250 MG tablet Take 1 tablet (250 mg total) by mouth 2 (two) times daily with a meal for 10 days. 06/10/22 06/20/22  Gabriel Cirri, NP  cyanocobalamin (VITAMIN B12) 1000 MCG tablet Take 1,000 mcg by mouth daily.    [provider]  gabapentin (NEURONTIN) 100 MG capsule Take 1 capsule (100 mg total) by mouth at bedtime as needed. 03/25/22   Johnson, Megan P, DO  lidocaine (LIDODERM) 5 % Place 1 patch onto the skin daily. Remove & Discard patch within 12 hours or as directed by MD 02/25/22   Olevia Perches P, DO  lisinopril (ZESTRIL) 5  MG tablet Take 1 tablet (5 mg total) by mouth 2 (two) times daily. 11/08/21   Johnson, Megan P, DO  Multiple Vitamins-Minerals (PRESERVISION AREDS PO) Take 1 tablet by mouth 2 (two) times daily.    [provider]  rosuvastatin (CRESTOR) 10 MG tablet TAKE 1 TABLET BY MOUTH ONCE DAILY 05/28/22   Olevia Perches P, DO   US Venous Img Lower Unilateral Left  Result Date: 06/17/2022 CLINICAL DATA:  Left leg swelling EXAM: Left LOWER EXTREMITY VENOUS DOPPLER ULTRASOUND TECHNIQUE: Gray-scale sonography with compression, as well as color and duplex ultrasound, were performed to evaluate the deep venous system(s) from the level of the common femoral vein through the popliteal and proximal calf veins. COMPARISON:  Lower extremity ultrasound 04/24/2022 FINDINGS: VENOUS Normal compressibility of the common femoral, superficial femoral, and popliteal veins, as well as the visualized calf veins. Visualized portions of profunda femoral vein and great saphenous vein unremarkable. No filling defects to suggest DVT on grayscale or color Doppler imaging. Doppler waveforms show normal direction of venous flow, normal respiratory plasticity and response to augmentation. Limited views of the contralateral common femoral vein are unremarkable. OTHER None. Limitations: none IMPRESSION: Negative. Electronically Signed   By: Minerva Fester M.D.   On: 06/17/2022 03:21   CT HEAD WO CONTRAST ( )  Result Date: 06/17/2022 CLINICAL DATA:  Trauma. EXAM: CT HEAD WITHOUT CONTRAST CT CERVICAL SPINE WITHOUT CONTRAST TECHNIQUE: Multidetector CT imaging of the head and cervical spine was performed following the standard protocol without intravenous contrast. Multiplanar CT image reconstructions of the cervical spine were also generated. RADIATION DOSE REDUCTION: This exam was performed according to the departmental dose-optimization program which includes automated exposure control, adjustment of the mA and/or kV according to patient  size and/or use of iterative reconstruction technique. COMPARISON:  CT dated 12/12/2021. FINDINGS: CT HEAD FINDINGS Brain: Mild age-related atrophy and chronic microvascular ischemic changes. There is no acute intracranial hemorrhage. No mass effect or midline shift. No extra-axial fluid collection. Vascular: No hyperdense vessel or unexpected calcification. Skull: Normal. Negative for fracture or focal lesion. Sinuses/Orbits: Diffuse mucoperiosteal thickening of paranasal sinuses with opacification of multiple ethmoid air cells. No air-fluid. The mastoid air cells are clear. Other: None CT CERVICAL SPINE FINDINGS Alignment: No acute subluxation. Skull base and vertebrae: No acute fracture.  Osteopenia. Soft tissues and spinal canal: No prevertebral fluid or swelling. No visible canal hematoma. Disc levels:  No acute findings.  Degenerative changes. Upper chest: Negative. Other: Bilateral carotid bulb calcified plaques. IMPRESSION:  1. No acute intracranial pathology. Mild age-related atrophy and chronic microvascular ischemic changes. 2. No acute/traumatic cervical spine pathology. Electronically Signed   By: Elgie Collard M.D.   On: 06/17/2022 03:16   CT Cervical Spine Wo Contrast  Result Date: 06/17/2022 CLINICAL DATA:  Trauma. EXAM: CT HEAD WITHOUT CONTRAST CT CERVICAL SPINE WITHOUT CONTRAST TECHNIQUE: Multidetector CT imaging of the head and cervical spine was performed following the standard protocol without intravenous contrast. Multiplanar CT image reconstructions of the cervical spine were also generated. RADIATION DOSE REDUCTION: This exam was performed according to the departmental dose-optimization program which includes automated exposure control, adjustment of the mA and/or kV according to patient size and/or use of iterative reconstruction technique. COMPARISON:  CT dated 12/12/2021. FINDINGS: CT HEAD FINDINGS Brain: Mild age-related atrophy and chronic microvascular ischemic changes. There is  no acute intracranial hemorrhage. No mass effect or midline shift. No extra-axial fluid collection. Vascular: No hyperdense vessel or unexpected calcification. Skull: Normal. Negative for fracture or focal lesion. Sinuses/Orbits: Diffuse mucoperiosteal thickening of paranasal sinuses with opacification of multiple ethmoid air cells. No air-fluid. The mastoid air cells are clear. Other: None CT CERVICAL SPINE FINDINGS Alignment: No acute subluxation. Skull base and vertebrae: No acute fracture.  Osteopenia. Soft tissues and spinal canal: No prevertebral fluid or swelling. No visible canal hematoma. Disc levels:  No acute findings.  Degenerative changes. Upper chest: Negative. Other: Bilateral carotid bulb calcified plaques. IMPRESSION: 1. No acute intracranial pathology. Mild age-related atrophy and chronic microvascular ischemic changes. 2. No acute/traumatic cervical spine pathology. Electronically Signed   By: Elgie Collard M.D.   On: 06/17/2022 03:16   CT Hip Right Wo Contrast  Result Date: 06/17/2022 CLINICAL DATA:  Multiple falls in the past week EXAM: CT OF THE RIGHT HIP WITHOUT CONTRAST TECHNIQUE: Multidetector CT imaging of the right hip was performed according to the standard protocol. Multiplanar CT image reconstructions were also generated. RADIATION DOSE REDUCTION: This exam was performed according to the departmental dose-optimization program which includes automated exposure control, adjustment of the mA and/or kV according to patient size and/or use of iterative reconstruction technique. COMPARISON:  Radiographs 06/16/2022 and CT of the right hip 12/12/2021 FINDINGS: Bones/Joint/Cartilage Probable nondisplaced fracture of the anterior superior acetabulum (series 2/image 47 and series 7/image 50). Demineralization. Ligaments Suboptimally assessed by CT. Muscles and Tendons No acute abnormality. Soft tissues Subcutaneous edema over the greater trochanter.  Distended bladder. IMPRESSION: Probable  nondisplaced fracture of the anterior superior acetabulum Electronically Signed   By: Minerva Fester M.D.   On: 06/17/2022 03:08   DG Wrist Complete Right  Result Date: 06/16/2022 CLINICAL DATA:  Fall, right wrist pain EXAM: RIGHT WRIST - COMPLETE 3+ VIEW COMPARISON:  None Available. FINDINGS: Normal alignment. No acute fracture or dislocation. Mild degenerate arthritis of the first carpometacarpal joint at the base of the thumb. Remaining joint spaces are preserved. Mild soft tissue swelling dorsal to the distal radius. IMPRESSION: 1. Soft tissue swelling. No acute fracture or dislocation. Electronically Signed   By: Helyn Numbers M.D.   On: 06/16/2022 18:07   DG Hip Unilat  With Pelvis 2-3 Views Right  Result Date: 06/16/2022 CLINICAL DATA:  Multiple falls, right hip pain EXAM: DG HIP (WITH OR WITHOUT PELVIS) 2-3V RIGHT COMPARISON:  None Available. FINDINGS: Normal alignment. No acute fracture or dislocation. Mild bilateral degenerative hip arthritis. Vascular calcifications are seen within the pelvis and medial thighs bilaterally. IMPRESSION: 1. No acute fracture or dislocation. Electronically Signed   By: Gloris Ham  Ramiro HarvestParikh M.D.   On: 06/16/2022 18:04    Positive ROS: All other systems have been reviewed and were otherwise negative with the exception of those mentioned in the HPI and as above.  Physical Exam: General: Alert, no acute distress Cardiovascular: No pedal edema Respiratory: No cyanosis, no use of accessory musculature GI: No organomegaly, abdomen is soft and non-tender Skin: No lesions in the area of chief complaint Neurologic: Sensation intact distally Psychiatric: Patient is competent for consent with normal mood and affect Lymphatic: No axillary or cervical lymphadenopathy  MUSCULOSKELETAL: minimal pain with IR/ER of right hip. Compartments soft. Good cap refill. Motor and sensory intact distally.  Assessment: Right anterior/superior acetabular fracture  Plan: Fracture  is non-displaced. His exam is benign. Recommend WBAT on the right and physical therapy. He may follow-up in 12 to 14 days after discharge for outpatient x-ray. Continue PT.    Lyndle HerrlichJames R Macallan Ord, MD    06/17/2022 10:13 AM

## 2022-06-17 NOTE — Assessment & Plan Note (Signed)
No complaints of chest pain and EKG nonacute Continue lisinopril, rosuvastatin and aspirin

## 2022-06-17 NOTE — Assessment & Plan Note (Addendum)
Orthopedics consult Pain control Will keep n.p.o. and SCDs for DVT prophylaxis

## 2022-06-18 DIAGNOSIS — S32401A Unspecified fracture of right acetabulum, initial encounter for closed fracture: Secondary | ICD-10-CM | POA: Diagnosis not present

## 2022-06-18 DIAGNOSIS — R296 Repeated falls: Secondary | ICD-10-CM

## 2022-06-18 DIAGNOSIS — I10 Essential (primary) hypertension: Secondary | ICD-10-CM

## 2022-06-18 MED ORDER — HYDROCHLOROTHIAZIDE 12.5 MG PO TABS
12.5000 mg | ORAL_TABLET | Freq: Every day | ORAL | Status: DC
  Administered 2022-06-19: 12.5 mg via ORAL
  Filled 2022-06-18: qty 1

## 2022-06-18 MED ORDER — GABAPENTIN 100 MG PO CAPS: 100.0000 mg | ORAL_CAPSULE | Freq: Every day | ORAL | Status: DC

## 2022-06-18 MED ORDER — POLYETHYLENE GLYCOL 3350 17 G PO PACK: 17.0000 g | PACK | Freq: Every day | ORAL | Status: DC | PRN

## 2022-06-18 MED ORDER — ACETAMINOPHEN 325 MG PO TABS: 650.0000 mg | ORAL_TABLET | Freq: Four times a day (QID) | ORAL | Status: DC | PRN

## 2022-06-18 MED ORDER — ACETAMINOPHEN 500 MG PO TABS
1000.0000 mg | ORAL_TABLET | Freq: Three times a day (TID) | ORAL | Status: DC
  Filled 2022-06-18: qty 2

## 2022-06-18 MED ORDER — HYDRALAZINE HCL 20 MG/ML IJ SOLN: 5.0000 mg | Freq: Four times a day (QID) | INTRAMUSCULAR | Status: DC | PRN

## 2022-06-18 MED ORDER — SENNOSIDES-DOCUSATE SODIUM 8.6-50 MG PO TABS: 1.0000 | ORAL_TABLET | Freq: Every day | ORAL | Status: DC

## 2022-06-18 NOTE — Evaluation (Signed)
Occupational Therapy Evaluation Patient Details Name: Nicholas Gross MRN: [id number] DOB: 04-27-1928 Today's Date: 06/18/2022   History of Present Illness Nicholas Gross is a 88 y.o. male who complains of right hip pain after fall. The pain is sharp in character. The pain is severe and 2/10. The pain is worse with movement and better with rest. Ortho assessment and plan Assessment:  Right anterior/superior acetabular fracture     Plan:  Fracture is non-displaced. His exam is benign. Recommend WBAT on the right and physical therapy. He may follow-up in 12 to 14 days after discharge for outpatient x-ray. Continue PT.   Clinical Impression   Patient presenting with decreased independence in self care, balance, functional mobility/transfers, endurance, and safety awareness. Prior to admission, pt lived alone, was Mod I for ADLs, and received assistance from neighbor for IADLs (transportation, groceries). Pt currently functioning at supervision for bed mobility, Min guard for SPT from EOB>recliner, and set up-supervision for seated grooming tasks. Pt required multimodal cues for sequencing of tasks as well as safety awareness during OOB mobility. Pt deferred use of RW despite education. Pt will benefit from acute OT to increase overall independence in the areas of ADLs and functional mobility. Pt would benefit form 24/7 assistance/supervision at discharge due to current cognitive deficits and safety concerns with pt living alone. Follow physician's recommendations for safe D/C plan.      Recommendations for follow up therapy are one component of a multi-disciplinary discharge planning process, led by the attending physician.  Recommendations may be updated based on patient status, additional functional criteria and insurance authorization.   Follow Up Recommendations  Follow physician's recommendations for discharge plan and follow up therapies     Assistance Recommended at Discharge Frequent or  constant Supervision/Assistance  Patient can return home with the following A little help with walking and/or transfers;A little help with bathing/dressing/bathroom;Assistance with cooking/housework;Assist for transportation;Help with stairs or ramp for entrance;Direct supervision/assist for financial management;Direct supervision/assist for medications management    Functional Status Assessment  Patient has had a recent decline in their functional status and demonstrates the ability to make significant improvements in function in a reasonable and predictable amount of time.  Equipment Recommendations  None recommended by OT    Recommendations for Other Services       Precautions / Restrictions Precautions Precautions: Fall Restrictions Weight Bearing Restrictions: Yes RLE Weight Bearing: Weight bearing as tolerated      Mobility Bed Mobility Overal bed mobility: Needs Assistance Bed Mobility: Supine to Sit     Supine to sit: HOB elevated, Supervision          Transfers Overall transfer level: Needs assistance Equipment used: None Transfers: Bed to chair/wheelchair/BSC   Stand pivot transfers: Min guard         General transfer comment: pt deferred use of RW, VC for hand placement      Balance Overall balance assessment: Needs assistance Sitting-balance support: Feet supported Sitting balance-[name]: Good     Standing balance support: Single extremity supported Standing balance-[name]: Fair                             ADL either performed or assessed with clinical judgement   ADL Overall ADL's : Needs assistance/impaired     Grooming: Set up;Sitting;Wash/dry face;Cueing for sequencing                   Toilet Transfer: Stand-pivot;Min  guard;Cueing for safety;Cueing for sequencing Toilet Transfer Details (indicate cue type and reason): simulated with SPT from EOB>recliner, pt deferred use of RW                  Vision Baseline Vision/History: 1 Wears glasses Patient Visual Report: No change from baseline Additional Comments: Per chart review, pt has macular degeneration     Perception     Praxis      Pertinent Vitals/Pain Pain Assessment Pain Assessment: No/denies pain     Hand Dominance Right   Extremity/Trunk Assessment Upper Extremity Assessment Upper Extremity Assessment: Generalized weakness   Lower Extremity Assessment Lower Extremity Assessment: Generalized weakness       Communication Communication Communication: No difficulties   Cognition Arousal/Alertness: Awake/alert Behavior During Therapy: Flat affect, Restless Overall Cognitive Status: No family/caregiver present to determine baseline cognitive functioning Area of Impairment: Orientation, Memory, Following commands, Safety/judgement, Problem solving, Awareness                 Orientation Level: Disoriented to, Place, Time, Situation   Memory: Decreased short-term memory Following Commands: Follows one step commands with increased time Safety/Judgement: Decreased awareness of safety, Decreased awareness of deficits Awareness: Emergent Problem Solving: Slow processing, Requires verbal cues General Comments: Oriented to self. Pt not making eye contact during session, appeared restless even though pt reported being comfortable sitting in recliner. Slow processing, decreased safety awareness. Education provided for use of call bell, assistance required to locate button 2/2 low vision.     General Comments       Exercises Other Exercises Other Exercises: OT provided education re: role of OT, OT POC, post acute recs, sitting up for all meals, EOB/OOB mobility with assistance, home/fall safety.     Shoulder Instructions      Home Living Family/patient expects to be discharged to:: Private residence Living Arrangements: Alone Available Help at Discharge: Neighbor Type of Home: Mobile home Home  Access: Stairs to enter [employer] of Steps: 5 Entrance Stairs-Rails: Can reach both Home Layout: One level     Bathroom Shower/Tub: Walk-in shower;Tub/shower unit   Bathroom Toilet: Standard     Home Equipment: Cane - single point;Cane - quad   Additional Comments: Information obtained from previous evaluation as pt is a poor historian.      Prior Functioning/Environment Prior Level of Function : Patient poor historian/Family not available;History of Falls (last six months);Needs assist;Independent/Modified Independent       Physical Assist : ADLs (physical)   ADLs (physical): IADLs Mobility Comments: Pt ambualted at household level with FWW independently. Amblated to car and in the grocery stores independently. Neighbor provides transportation. ADLs Comments: pt's sister reports neighbor takes him grocery shopping, assists with bringing groceries in. Pt puts groceries away, cooks own meals, performs ADL with MOD I. Fall history        OT Problem List: Decreased strength;Decreased activity tolerance;Impaired balance (sitting and/or standing);Impaired vision/perception;Decreased cognition;Decreased safety awareness;Decreased knowledge of use of DME or AE;Decreased knowledge of precautions      OT Treatment/Interventions: Self-care/ADL training;Therapeutic exercise;Therapeutic activities;Cognitive remediation/compensation;DME and/or AE instruction;Patient/family education;Balance training    OT Goals(Current goals can be found in the care plan section) Acute Rehab OT Goals Patient Stated Goal: none stated OT Goal Formulation: Patient unable to participate in goal setting Time For Goal Achievement: 07/02/22 Potential to Achieve Goals: Fair   OT Frequency: Min 2X/week    Co-evaluation              AM-PAC OT "  6 Clicks" Daily Activity     Outcome Measure Help from another person eating meals?: None Help from another person taking care of personal grooming?:  A Little Help from another person toileting, which includes using toliet, bedpan, or urinal?: A Little Help from another person bathing (including washing, rinsing, drying)?: A Little Help from another person to put on and taking off regular upper body clothing?: A Little Help from another person to put on and taking off regular lower body clothing?: A Little 6 Click Score: 19   End of Session Equipment Utilized During Treatment: Gait belt Nurse Communication: Mobility status  Activity Tolerance: Patient tolerated treatment well Patient left: in chair;with call bell/phone within reach;with chair alarm set;Other (comment) (MD present at end of session)  OT Visit Diagnosis: Other symptoms and signs involving cognitive function;Muscle weakness (generalized) (M62.81);Unsteadiness on feet (R26.81);Low vision, both eyes (H54.2)                Time: 1308-6578 OT Time Calculation (min): 11 min Charges:  OT General Charges $OT Visit: 1 Visit OT Evaluation $OT Eval Low Complexity: 1 Low  [facility] MS, OTR/L ascom [phone]  06/18/22, 6:26 PM

## 2022-06-18 NOTE — Progress Notes (Signed)
Physical Therapy Treatment Patient Details Name: DACOTA RUBEN MRN: [id number] DOB: 1928/01/23 Today's Date: 06/18/2022   History of Present Illness DERWIN REDDY is a 88 y.o. male who complains of right hip pain after fall. The pain is sharp in character. The pain is severe and 2/10. The pain is worse with movement and better with rest. Ortho assessment and plan Assessment:  Right anterior/superior acetabular fracture     Plan:  Fracture is non-displaced. His exam is benign. Recommend WBAT on the right and physical therapy. He may follow-up in 12 to 14 days after discharge for outpatient x-ray. Continue PT.    PT Comments    Pt was sitting in recliner upon arriving. He is A but disoriented overall to time, situation, and presents with poor awareness of safety. Unaware he fx his R LE but does endorse feeling sore. Pt demonstrated safe ability to stand, ambulate and perform stairs with supervision only. [name] niece arrived at conclusion of session and voiced concerns with pt Dcing home alone. " He has not been taking his medicines like he is prescribed. Chartered loss adjuster notified MD/CM of family members concerns. From a PT standpoint, pt is doing well. Pt would benefit form 24/7 assistance/supervision due to cognition concerns more so than physical deficits. Acute PT will continue to follow pr current POC.    Recommendations for follow up therapy are one component of a multi-disciplinary discharge planning process, led by the attending physician.  Recommendations may be updated based on patient status, additional functional criteria and insurance authorization.  Follow Up Recommendations  Follow physician's recommendations for discharge plan and follow up therapies (pt would benefit from 24/7 assist/supervision)     Assistance Recommended at Discharge Set up Supervision/Assistance  Patient can return home with the following A little help with walking and/or transfers;A little help with  bathing/dressing/bathroom;Assistance with cooking/housework;Direct supervision/assist for medications management;Assist for transportation;Help with stairs or ramp for entrance   Equipment Recommendations  Rolling walker (2 wheels);BSC/3in1       Precautions / Restrictions Precautions Precautions: Fall Restrictions Weight Bearing Restrictions: Yes RLE Weight Bearing: Weight bearing as tolerated     Mobility  Bed Mobility    General bed mobility comments: In recliner pre/post session    Transfers Overall transfer level: Needs assistance Equipment used: Rolling walker (2 wheels) Transfers: Sit to/from Stand Sit to Stand: Supervision      General transfer comment: no physical assistance required to stand or sit. was cued for improved technique only    Ambulation/Gait Ambulation/Gait assistance: Supervision Gait Distance (Feet): 200 Feet Assistive device: Rolling walker (2 wheels) Gait Pattern/deviations: Step-through pattern, Antalgic Gait velocity: decreased     General Gait Details: no LOB or safty coincerbn during ambulation with RW.   Stairs Stairs: Yes Stairs assistance: Supervision Stair Management: Two rails, Step to pattern Number of Stairs: 4 General stair comments: Pt was able to ambulate up/down 4 stair without physical assistance    Balance Overall balance assessment: Needs assistance Sitting-balance support: No upper extremity supported, Feet unsupported Sitting balance-[name]: Normal     Standing balance support: Bilateral upper extremity supported Standing balance-[name]: Good       Cognition Arousal/Alertness: Awake/alert Behavior During Therapy: WFL for tasks assessed/performed Overall Cognitive Status: Within Functional Limits for tasks assessed      General Comments: Pt is A but disoriented to overall situation. Cognition deficits observed throughout session.           General Comments General comments (skin integrity,  edema, etc.): [name] concerned neice arrived to session after pt was up ambulating. Voiced concerns with pt DCing home. MD and CM made aware.      Pertinent Vitals/Pain Pain Assessment Pain Assessment: No/denies pain     PT Goals (current goals can now be found in the care plan section) Acute Rehab PT Goals Patient Stated Goal: " I want to go home." Progress towards PT goals: Progressing toward goals    Frequency    7X/week      PT Plan Current plan remains appropriate       AM-PAC PT "6 Clicks" Mobility   Outcome Measure  Help needed turning from your back to your side while in a flat bed without using bedrails?: None Help needed moving from lying on your back to sitting on the side of a flat bed without using bedrails?: None Help needed moving to and from a bed to a chair (including a wheelchair)?: A Little Help needed standing up from a chair using your arms (e.g., wheelchair or bedside chair)?: A Little Help needed to walk in hospital room?: A Little Help needed climbing 3-5 steps with a railing? : A Little 6 Click Score: 20    End of Session   Activity Tolerance: Patient tolerated treatment well Patient left: in chair;with call bell/phone within reach;with chair alarm set;with family/visitor present Nurse Communication: Mobility status;Precautions PT Visit Diagnosis: Muscle weakness (generalized) (M62.81);Difficulty in walking, not elsewhere classified (R26.2);Pain Pain - Right/Left: Right Pain - part of body: Hip     Time: 4818-5631 PT Time Calculation (min) (ACUTE ONLY): 37 min  Charges:  $Gait Training: 8-22 mins $Therapeutic Activity: 8-22 mins                    Jetta Lout PTA 06/18/22, 9:39 AM

## 2022-06-18 NOTE — Progress Notes (Addendum)
Progress Note   Patient: Nicholas Gross VJK:[id number] DOB: 01-10-1928 DOA: 06/17/2022     1 DOS: the patient was seen and examined on 06/18/2022   Brief hospital course: Nicholas Gross is a 88 y.o. male with medical history significant for hypertension, hyperlipidemia, CAD, carotid atherosclerosis, PAD, frequent falls for which he was hospitalized in June 2023 and ruled out for stroke, subsequently followed by neurology last seen 12/18 with working diagnosis of muscle spasm/trauma/imbalance for which she was started on gabapentin who  presented after multiple unwitnessed falls over the past several days with complaints of right hip pain and was found to have a nondisplaced right anterior superior acetabular fracture on CT hip.  He was evaluated by orthopedic service on 12/26 who recommended weightbearing as tolerated on the right and physical therapy with plan for outpatient follow-up in next 12 to 14 days after discharge  (and outpatient x-ray )  On initial admission he was started on IV antibiotics due to concern for bilateral lower extremity cellulitis which discontinue as his physical exam findings were more consistent with venous stasis given blanching erythema remaining afebrile with no acute signs of infection.  Additionally left lower extremity venous ultrasound negative for DVT  Assessment and Plan: Closed right acetabular fracture (HCC), stable - Weightbearing as tolerated as recommended by Ortho - PT recommends home health PT will need assistance 24/7 from family -pain control: will schedule tylenol 1000 mg q 8 hours, and use prn norco for moderate pain to be able to space off IV morphine for severe pain during hospitalization -per [facility] has previously been on lidocaine patch, that may be an option if pain control is hard to achieve with current regimen -optimize bowel regime: senna docusate and miralax   Frequent falls likely multifactorial etiology secondary to gait imbalance  and possible polypharmacy Recently started on gabapentin by his neurologist for muscle spasms, low suspicion for acute neurologic deficit given negative CT head findings and physical exam patient is alert x 4 and able to follow all commands, UA negative.  Does have some venous stasis in bilateral lower extremities, orthostatic vital signs were within normal limits, and no concerning findings on telemetry - Monitor telemetry - Discontinue gabapentin  -Check Vit B12 -OT eval and treat recommends 24/7 assistance/supervision at discharge due to current cognitive deficits and safety concerns with pt living alone   Bilateral blanching erythema of right lower extremities consistent with venous stasis Previously treated with ceftin as outpatient. Procalcitonin wnl - IV Rocephin discontinued  -Wound care per nursing  PAD (peripheral artery disease) (HCC) History of carotid endarterectomy Continue aspirin and rosuvastatin Distal pulses were dopplerable in the ED  CAD (coronary artery disease) No complaints of chest pain and EKG nonacute Continue lisinopril, rosuvastatin and aspirin  Hypertension, slightly worse BP 148/71-156/77 Likely worsening while holding home HCTZ in setting of frequent fall work up Continue lisinopril Resume home HCTZ and monitor IV hydralazine PRN        Subjective: no complaints. Would like for me to call his family  Physical Exam: Vitals:   06/17/22 1706 06/18/22 0700 06/18/22 1635 06/18/22 1837  BP: (!) 179/84 (!) 156/77 (!) 148/71 (!) 161/73  Pulse: 73 79 66 64  Resp: 16 16 16 16   Temp: (!) 97.5 F (36.4 C) 98.1 F (36.7 C) 97.6 F (36.4 C) 97.9 F (36.6 C)  TempSrc: Oral Oral Oral Oral  SpO2: 100% 99% 100% 100%  Weight:      Height:  Alert and oriented x 4, in no acute distress No respiratory effort on room air Loud SEM present, no peripheral edema Bilateral blanching erythema of the lower extremities above ankles Abrasion on legs no  drainage Able to move all extremities without any focal deficits Data Reviewed:  There are no new results to review at this time.  Family Communication: Called niece number at [phone]; no answer.  Disposition: Status is: Inpatient Remains inpatient appropriate because: optimizing pain control as still needing PRN IV morphine and making sure disposition is safe given frequent falls.   Planned Discharge Destination: Home with Home Health      Author: Laverna Peace, MD 06/18/2022 9:34 PM  For on call review [email or web address].

## 2022-06-18 NOTE — TOC Progression Note (Signed)
Transition of Care (TOC) - Progression Note    Patient Details  Name: Nicholas Gross MRN: [id number] Date of Birth: [birth year]  Transition of Care [facility]) CM/SW [place], RN Phone Number: 06/18/2022, 4:03 PM  Clinical Narrative:   Met with the patient and his Niece in the room He lives alone, I explained to them that he will need some family and friends to help him at home if they can, I explained that his insurance does not cover ALF and that he would have to have Long term Medicaid in place, I explained to her and the patient how to apply for Medicaid, I explained they could also apply for the CAP program but it does take a while to get that in place if he qualifies She asked me if his SS would pay for ALF, I explained that they do also take the SS except for a monthly allowance but SS does not cover the entire cost I explained that the patient would not be approved by Ins to go to [facility] SNF and the recommendation is to go to Home with [facility],  I encouraged them to get in touch with friends and family to have people come in to assist as needed The niece stated that he has church friends that may be able to help  He has a rolling walker at home, he has transportation with the niece I encouraged him to use his walker and carry his cell phone at home so if he falls he would be able to call out for help, he has had meal delivery in the past and does not want it again I reached out to [place] and requested the patient be accepted for [facility] PT    Expected Discharge Plan: [place] Barriers to Discharge: Barriers Resolved  Expected Discharge Plan and Services   Discharge Planning Services: CM Consult   Living arrangements for the past 2 months: Mobile Home                 DME Arranged: N/A DME Agency: [employer]       HH Arranged: PT [place] Agency: [place] ([place]) Date HH Agency Contacted: 06/18/22 Time [place]:  1602 Representative spoke with at [place]: [place] ([place]) Interventions [place]: No Food Insecurity (06/17/2022)  Housing: Low Risk  (06/17/2022)  Transportation Needs: No Transportation Needs (06/17/2022)  Utilities: Not At Risk (06/17/2022)  Alcohol Screen: Low Risk  (05/13/2021)  Depression (PHQ2-9): Low Risk  (06/10/2022)  Financial Resource Strain: Low Risk  (05/13/2021)  Physical Activity: Unknown (05/13/2021)  Social Connections: Moderately Isolated (05/13/2021)  Stress: No Stress Concern Present (05/13/2021)  Tobacco Use: Medium Risk (06/17/2022)    Readmission Risk Interventions     No data to display

## 2022-06-18 NOTE — Plan of Care (Signed)

## 2022-06-18 NOTE — Progress Notes (Signed)
Nutrition Brief Note  RD consulted for assessment of nutritional requirements/ status.   Wt Readings from Last 15 Encounters:  06/16/22 69.2 kg  06/10/22 69.2 kg  04/24/22 73 kg  04/02/22 75 kg  03/25/22 75.3 kg  02/25/22 73 kg  12/23/21 75.2 kg  12/12/21 75.8 kg  11/08/21 75.8 kg  08/06/21 75.4 kg  05/10/21 75.3 kg  11/05/20 78.5 kg  07/16/20 80.7 kg  05/08/20 78.7 kg  02/03/20 78.7 kg   Pt with medical history significant for hypertension, hyperlipidemia, CAD, carotid atherosclerosis, PAD, frequent falls for which he was hospitalized in June 2023 and ruled out for stroke, subsequently followed by neurology last seen 12/18 with working diagnosis of muscle spasm/trauma/imbalance for which she was started on gabapentin who presents after experiencing 3 falls in the past few days.   Pt admitted with closed rt acetabular fracture.   Per orthopedics notes, no plan for surgery.   Spoke with pt and niece at bedside. Pt lives alone and has a good appetite. Per niece, he really enjoys the hospital food and has been consuming 100% of meals. Pt consumes 3 meals per day (Breakfast: eggo waffles and 2 sausage links; Lunch: vegetable plate; Dinner: cheerios).   Pt denies any weight loss. Reviewed wt hx; pt has experienced a 8.7% wt loss over the past 6 months, which is not significant for time frame.   Nutrition-Focused physical exam completed. Findings are no fat depletion, no muscle depletion, and no edema.    Labs reviewed: Na: 131.    Current diet order is regular, patient is consuming approximately 100% of meals at this time. Labs and medications reviewed.   No nutrition interventions warranted at this time. If nutrition issues arise, please consult RD.   Levada Schilling, RD, LDN, CDCES Registered Dietitian II Certified Diabetes Care and Education Specialist Please refer to [facility] for RD and/or RD on-call/weekend/after hours pager

## 2022-06-18 NOTE — Progress Notes (Signed)
Pt extreme restless and agitated earlier, getting up, unable to redirect, attempt to discuss safety measures with pt. Pt wanted to get in bed then out bed to chair we had just place pt back to bed, full two person assist due to pain in hips. Pt given Morphine IV for pain, refused to take PO initially then took half of meds. Pt currently sleeping with all bed alarms on will cont to monitor. SRP, RN

## 2022-06-19 DIAGNOSIS — S32414A Nondisplaced fracture of anterior wall of right acetabulum, initial encounter for closed fracture: Secondary | ICD-10-CM

## 2022-06-19 LAB — BASIC METABOLIC PANEL
Anion gap: 10 (ref 5–15)
BUN: 16 mg/dL (ref 8–23)
CO2: 23 mmol/L (ref 22–32)
Calcium: 9 mg/dL (ref 8.9–10.3)
Chloride: 101 mmol/L (ref 98–111)
Creatinine, Ser: 0.61 mg/dL (ref 0.61–1.24)
GFR, Estimated: >60 mL/min (ref >60)
Glucose, Bld: 99 mg/dL (ref 70–99)
Potassium: 4 mmol/L (ref 3.5–5.1)
Sodium: 134 mmol/L — ABNORMAL LOW (ref 135–145)

## 2022-06-19 LAB — CBC WITH DIFFERENTIAL/PLATELET
Abs Immature Granulocytes: 0.04 10*3/uL (ref 0.00–0.07)
Basophils Absolute: 0.1 10*3/uL (ref 0.0–0.1)
Basophils Relative: 1 %
Eosinophils Absolute: 0.4 10*3/uL (ref 0.0–0.5)
Eosinophils Relative: 6 %
HCT: 36 % — ABNORMAL LOW (ref 39.0–52.0)
Hemoglobin: 12.4 g/dL — ABNORMAL LOW (ref 13.0–17.0)
Immature Granulocytes: 1 %
Lymphocytes Relative: 17 %
Lymphs Abs: 1.2 10*3/uL (ref 0.7–4.0)
MCH: 31.6 pg (ref 26.0–34.0)
MCHC: 34.4 g/dL (ref 30.0–36.0)
MCV: 91.6 fL (ref 80.0–100.0)
Monocytes Absolute: 0.7 10*3/uL (ref 0.1–1.0)
Monocytes Relative: 9 %
Neutro Abs: 4.6 10*3/uL (ref 1.7–7.7)
Neutrophils Relative %: 66 %
Platelets: 244 10*3/uL (ref 150–400)
RBC: 3.93 MIL/uL — ABNORMAL LOW (ref 4.22–5.81)
RDW: 11.5 % (ref 11.5–15.5)
WBC: 6.9 10*3/uL (ref 4.0–10.5)
nRBC: 0 % (ref 0.0–0.2)

## 2022-06-19 LAB — VITAMIN B12: Vitamin B-12: 605 pg/mL (ref 180–914)

## 2022-06-19 NOTE — Progress Notes (Signed)
Held PM  meds VSs, pt has a tendency to become agitated and violent when working with staff. SRP, RN

## 2022-06-19 NOTE — TOC Progression Note (Addendum)
Transition of Care [facility]) - Progression Note    Patient Details  Name: Nicholas Gross MRN: [id number] Date of Birth: December 10, 1927  Transition [facility] Care [facility]) CM/SW Contact  Marlowe Sax, RN Phone Number: 06/19/2022, 9:23 AM  Clinical Narrative:    The patient was accepted by [facility] for [facility] per [facility]   Expected Discharge Plan: Home w Home Health Services Barriers to Discharge: Barriers Resolved  Expected Discharge Plan and Services   Discharge Planning Services: CM Consult   Living arrangements for the past 2 months: Mobile Home                 DME Arranged: N/A DME Agency: [employer]       HH Arranged: PT [facility] for [facility] Date HH Agency Contacted: 06/18/22 Time HH Agency Contacted: 1602 Spoke with Elnita Maxwell   Social Determinants of Health (SDOH) Interventions SDOH Screenings   Food Insecurity: No Food Insecurity (06/17/2022)  Housing: Low Risk  (06/17/2022)  Transportation Needs: No Transportation Needs (06/17/2022)  Utilities: Not At Risk (06/17/2022)  Alcohol Screen: Low Risk  (05/13/2021)  Depression (PHQ2-9): Low Risk  (06/10/2022)  Financial Resource Strain: Low Risk  (05/13/2021)  Physical Activity: Unknown (05/13/2021)  Social Connections: Moderately Isolated (05/13/2021)  Stress: No Stress Concern Present (05/13/2021)  Tobacco Use: Medium Risk (06/17/2022)    Readmission Risk Interventions     No data to display

## 2022-06-19 NOTE — Progress Notes (Signed)
Physical Therapy Treatment Patient Details Name: Nicholas Gross MRN: [id number] DOB: 06-08-1928 Today's Date: 06/19/2022   History of Present Illness Nicholas Gross is a 88 y.o. male who complains of right hip pain after fall. The pain is sharp in character. The pain is severe and 2/10. The pain is worse with movement and better with rest. Ortho assessment and plan Assessment:  Right anterior/superior acetabular fracture     Plan:  Fracture is non-displaced. His exam is benign. Recommend WBAT on the right and physical therapy. He may follow-up in 12 to 14 days after discharge for outpatient x-ray. Continue PT.    PT Comments    Pt was attempting to get up OOB I'ly upon entering room. " I need to have a BM." Pt is alert and able to follow commands but requires increased processing time. Per chart, pt may have some sundowning in the afternoons/later evening. Pt does continue to be able to exit bed, stand to RW, and ambulate without physical assistance. He was unaware of hip fx. He stated he is is in the hospital because of hi back hurting. Reoriented pt to situation. Pt does have more antalgic gait today but was still able to ambulate with RW without LOB. Highly recommend 24/7 supervision/assistance at DC due to his cognition. HHPT also recommended to assist pt to PLOF.    Recommendations for follow up therapy are one component of a multi-disciplinary discharge planning process, led by the attending physician.  Recommendations may be updated based on patient status, additional functional criteria and insurance authorization.  Follow Up Recommendations  Follow physician's recommendations for discharge plan and follow up therapies (recommend 24/7 assistance/supervision due to cognition/safety awareness concerns)+ HHPT     Assistance Recommended at Discharge Set up Supervision/Assistance  Patient can return home with the following A little help with walking and/or transfers;A little help with  bathing/dressing/bathroom;Assistance with cooking/housework;Direct supervision/assist for medications management;Assist for transportation;Help with stairs or ramp for entrance   Equipment Recommendations  Rolling walker (2 wheels);BSC/3in1       Precautions / Restrictions Precautions Precautions: Fall Restrictions Weight Bearing Restrictions: Yes RLE Weight Bearing: Weight bearing as tolerated     Mobility  Bed Mobility Overal bed mobility: Needs Assistance Bed Mobility: Supine to Sit  Supine to sit: HOB elevated, Supervision  General bed mobility comments: Vcs to slow down. no physical assistance required to exit bed    Transfers Overall transfer level: Needs assistance Equipment used: Rolling walker (2 wheels) Transfers: Sit to/from Stand Sit to Stand: Supervision    General transfer comment: pt stood from EOB/toilet/ and recliner surfaces with supervision only. Does endorse some pain that he did not mention previous date    Ambulation/Gait Ambulation/Gait assistance: Supervision Gait Distance (Feet): 25 Feet Assistive device: Rolling walker (2 wheels) Gait Pattern/deviations: Step-through pattern, Antalgic Gait velocity: decreased     General Gait Details: no LOB but more antalgic gait pattern overall    Balance Overall balance assessment: Needs assistance Sitting-balance support: Feet supported Sitting balance-[name]: Good     Standing balance support: Bilateral upper extremity supported, During functional activity, Reliant on assistive device for balance Standing balance-[name]: Good       Cognition Arousal/Alertness: Awake/alert Behavior During Therapy: WFL for tasks assessed/performed Overall Cognitive Status: Impaired/Different from baseline      General Comments: per chart review, pt might have sun downing. He wa calm and cooperative this morning but does require alot of tim to process. slower processing today versus previous  date.                Pertinent Vitals/Pain Pain Assessment Pain Assessment: 0-10 Pain Score: 4  Pain Location: back and hip Pain Descriptors / Indicators: Discomfort Pain Intervention(s): Monitored during session, Limited activity within patient's tolerance, Repositioned     PT Goals (current goals can now be found in the care plan section) Acute Rehab PT Goals Patient Stated Goal: none stated Progress towards PT goals: Progressing toward goals    Frequency    7X/week      PT Plan Current plan remains appropriate       AM-PAC PT "6 Clicks" Mobility   Outcome Measure  Help needed turning from your back to your side while in a flat bed without using bedrails?: None Help needed moving from lying on your back to sitting on the side of a flat bed without using bedrails?: None Help needed moving to and from a bed to a chair (including a wheelchair)?: A Little Help needed standing up from a chair using your arms (e.g., wheelchair or bedside chair)?: A Little Help needed to walk in hospital room?: A Little Help needed climbing 3-5 steps with a railing? : A Little 6 Click Score: 20    End of Session   Activity Tolerance: Patient tolerated treatment well Patient left: in chair;with call bell/phone within reach;with chair alarm set;with family/visitor present Nurse Communication: Mobility status;Precautions PT Visit Diagnosis: Muscle weakness (generalized) (M62.81);Difficulty in walking, not elsewhere classified (R26.2);Pain Pain - Right/Left: Right Pain - part of body: Hip     Time: 1610-9604 PT Time Calculation (min) (ACUTE ONLY): 25 min  Charges:  $Gait Training: 8-22 mins $Therapeutic Activity: 8-22 mins                     Jetta Lout PTA 06/19/22, 10:07 AM

## 2022-06-19 NOTE — Plan of Care (Signed)
  Problem: Clinical Measurements: Goal: Diagnostic test results will improve Outcome: Progressing   Problem: Clinical Measurements: Goal: Respiratory complications will improve Outcome: Progressing   Problem: Clinical Measurements: Goal: Cardiovascular complication will be avoided Outcome: Progressing   Problem: Pain Managment: Goal: General experience of comfort will improve Outcome: Progressing   Problem: Safety: Goal: Ability to remain free from injury will improve Outcome: Progressing   Problem: Skin Integrity: Goal: Risk for impaired skin integrity will decrease Outcome: Progressing   

## 2022-06-19 NOTE — Discharge Summary (Signed)
Physician Discharge Summary  [name] NWG:[id number] DOB: 02-07-1928 DOA: 06/17/2022  PCP: Dorcas Carrow, DO  Admit date: 06/17/2022 Discharge date: 06/19/2022  Admitted From: Home Disposition: Home  Recommendations for Outpatient Follow-up:  Follow up with PCP in 1-2 weeks Follow-up with orthopedics, Dr. Odis Luster in 12-14 days for repeat x-rays Discontinue gabapentin May need to consider assisted living arrangements in the near future Continue outpatient discussions regarding goals of care given his advanced age and debility  Home Health: PT/OT/RN/aide/social work Equipment/Devices: Agricultural consultant, 3 N 1 bedside commode  Discharge Condition: Stable CODE STATUS: Full code Diet recommendation: Heart healthy diet  History of present illness:  Nicholas Gross is a 88 y.o. male with medical history significant for hypertension, hyperlipidemia, CAD, carotid atherosclerosis, PAD, frequent falls for which he was hospitalized in June 2023 and ruled out for stroke, subsequently followed by neurology last seen 12/18 with working diagnosis of muscle spasm/trauma/imbalance for which she was started on gabapentin who  presented after multiple unwitnessed falls over the past several days with complaints of right hip pain and was found to have a nondisplaced right anterior superior acetabular fracture on CT hip.   Hospital course:  Closed right acetabular fracture [facility]), stable Patient presenting to the ED with recurrent falls and was noted to have a nondisplaced right anterior superior acetabular fracture on CT head.  Patient was seen by orthopedics, Dr. Odis Luster who recommended nonoperative management and weight-bearing as tolerates.  Pain is currently controlled and PT/OT recommend home health.  Will need outpatient follow-up with orthopedics, Dr. Odis Luster in 12-14 days for repeat x-rays.   Frequent falls likely multifactorial etiology secondary to gait imbalance and possible  polypharmacy Recently started on gabapentin by his neurologist for muscle spasms, low suspicion for acute neurologic deficit given negative CT head findings and physical exam patient is alert x 4 and able to follow all commands, UA negative, vitamin B12 within normal limits.  Does have some venous stasis in bilateral lower extremities, orthostatic vital signs were within normal limits, and no concerning findings on telemetry gabapentin was discontinued.  Seen by PT and OT with recommendation of home health.  Given his decline, multiple falls would likely benefit from assisted living placement.  Outpatient follow-up with PCP.   Bilateral blanching erythema of right lower extremities consistent with venous stasis Previously treated with ceftin as outpatient. Procalcitonin wnl.  Initially started on ceftriaxone while inpatient but now discontinued as findings consistent with venous stasis.  Lower extremity elevation.   PAD (peripheral artery disease) (HCC) History of carotid endarterectomy Continue aspirin and rosuvastatin   CAD (coronary artery disease) No complaints of chest pain and EKG nonacute. Continue lisinopril, rosuvastatin and aspirin   Hypertension Continue lisinopril and HCTZ.  Discharge Diagnoses:  Principal Problem:   Right acetabular fracture (HCC) Active Problems:   Multiple falls   Cellulitis of both lower extremities   Hypertension   CAD (coronary artery disease)   Lymphedema   PAD (peripheral artery disease) (HCC)    Discharge Instructions  Discharge Instructions     Call MD for:  difficulty breathing, headache or visual disturbances   Complete by: As directed    Call MD for:  extreme fatigue   Complete by: As directed    Call MD for:  persistant dizziness or light-headedness   Complete by: As directed    Call MD for:  persistant nausea and vomiting   Complete by: As directed    Call MD for:  severe uncontrolled pain  Complete by: As directed    Call MD  for:  temperature >100.4   Complete by: As directed    Diet - low sodium heart healthy   Complete by: As directed    Increase activity slowly   Complete by: As directed       Allergies as of 06/19/2022       Reactions   Other    Patient states that he is allergic to something that they place in the IV before they work on you   Simvastatin Other (See Comments)   Myalgia        Medication List     STOP taking these medications    cefUROXime 250 MG tablet Commonly known as: CEFTIN   gabapentin 100 MG capsule Commonly known as: NEURONTIN   lidocaine 5 % Commonly known as: Lidoderm       TAKE these medications    cyanocobalamin 1000 MCG tablet Commonly known as: VITAMIN B12 Take 1,000 mcg by mouth daily.   hydrochlorothiazide 12.5 MG capsule Commonly known as: MICROZIDE Take 12.5 mg by mouth daily.   lisinopril 5 MG tablet Commonly known as: ZESTRIL Take 1 tablet (5 mg total) by mouth 2 (two) times daily.   PRESERVISION AREDS PO Take 1 tablet by mouth 2 (two) times daily.   rosuvastatin 10 MG tablet Commonly known as: CRESTOR TAKE 1 TABLET BY MOUTH ONCE DAILY        Follow-up Information     Olevia Perches P, DO. Schedule an appointment as soon as possible for a visit in 1 week(s).   Specialty: Family Medicine Contact information: [address] [place] [address] [phone]         Lyndle Herrlich, MD. Schedule an appointment as soon as possible for a visit in 2 week(s).   Specialty: Orthopedic Surgery Contact information: [address] [place] [address] [phone]                Allergies  Allergen Reactions   Other     Patient states that he is allergic to something that they place in the IV before they work on you   Simvastatin Other (See Comments)    Myalgia     Consultations: Orthopedics, Dr. Odis Luster   Procedures/Studies: US Venous Img Lower Unilateral Left  Result Date: 06/17/2022 CLINICAL DATA:  Left  leg swelling EXAM: Left LOWER EXTREMITY VENOUS DOPPLER ULTRASOUND TECHNIQUE: Gray-scale sonography with compression, as well as color and duplex ultrasound, were performed to evaluate the deep venous system(s) from the level of the common femoral vein through the popliteal and proximal calf veins. COMPARISON:  Lower extremity ultrasound 04/24/2022 FINDINGS: VENOUS Normal compressibility of the common femoral, superficial femoral, and popliteal veins, as well as the visualized calf veins. Visualized portions of profunda femoral vein and great saphenous vein unremarkable. No filling defects to suggest DVT on grayscale or color Doppler imaging. Doppler waveforms show normal direction of venous flow, normal respiratory plasticity and response to augmentation. Limited views of the contralateral common femoral vein are unremarkable. OTHER None. Limitations: none IMPRESSION: Negative. Electronically Signed   By: Minerva Fester M.D.   On: 06/17/2022 03:21   CT HEAD WO CONTRAST ( )  Result Date: 06/17/2022 CLINICAL DATA:  Trauma. EXAM: CT HEAD WITHOUT CONTRAST CT CERVICAL SPINE WITHOUT CONTRAST TECHNIQUE: Multidetector CT imaging of the head and cervical spine was performed following the standard protocol without intravenous contrast. Multiplanar CT image reconstructions of the cervical spine were also generated.  RADIATION DOSE REDUCTION: This exam was performed according to the departmental dose-optimization program which includes automated exposure control, adjustment of the mA and/or kV according to patient size and/or use of iterative reconstruction technique. COMPARISON:  CT dated 12/12/2021. FINDINGS: CT HEAD FINDINGS Brain: Mild age-related atrophy and chronic microvascular ischemic changes. There is no acute intracranial hemorrhage. No mass effect or midline shift. No extra-axial fluid collection. Vascular: No hyperdense vessel or unexpected calcification. Skull: Normal. Negative for fracture or focal lesion.  Sinuses/Orbits: Diffuse mucoperiosteal thickening of paranasal sinuses with opacification of multiple ethmoid air cells. No air-fluid. The mastoid air cells are clear. Other: None CT CERVICAL SPINE FINDINGS Alignment: No acute subluxation. Skull base and vertebrae: No acute fracture.  Osteopenia. Soft tissues and spinal canal: No prevertebral fluid or swelling. No visible canal hematoma. Disc levels:  No acute findings.  Degenerative changes. Upper chest: Negative. Other: Bilateral carotid bulb calcified plaques. IMPRESSION: 1. No acute intracranial pathology. Mild age-related atrophy and chronic microvascular ischemic changes. 2. No acute/traumatic cervical spine pathology. Electronically Signed   By: Elgie Collard M.D.   On: 06/17/2022 03:16   CT Cervical Spine Wo Contrast  Result Date: 06/17/2022 CLINICAL DATA:  Trauma. EXAM: CT HEAD WITHOUT CONTRAST CT CERVICAL SPINE WITHOUT CONTRAST TECHNIQUE: Multidetector CT imaging of the head and cervical spine was performed following the standard protocol without intravenous contrast. Multiplanar CT image reconstructions of the cervical spine were also generated. RADIATION DOSE REDUCTION: This exam was performed according to the departmental dose-optimization program which includes automated exposure control, adjustment of the mA and/or kV according to patient size and/or use of iterative reconstruction technique. COMPARISON:  CT dated 12/12/2021. FINDINGS: CT HEAD FINDINGS Brain: Mild age-related atrophy and chronic microvascular ischemic changes. There is no acute intracranial hemorrhage. No mass effect or midline shift. No extra-axial fluid collection. Vascular: No hyperdense vessel or unexpected calcification. Skull: Normal. Negative for fracture or focal lesion. Sinuses/Orbits: Diffuse mucoperiosteal thickening of paranasal sinuses with opacification of multiple ethmoid air cells. No air-fluid. The mastoid air cells are clear. Other: None CT CERVICAL SPINE  FINDINGS Alignment: No acute subluxation. Skull base and vertebrae: No acute fracture.  Osteopenia. Soft tissues and spinal canal: No prevertebral fluid or swelling. No visible canal hematoma. Disc levels:  No acute findings.  Degenerative changes. Upper chest: Negative. Other: Bilateral carotid bulb calcified plaques. IMPRESSION: 1. No acute intracranial pathology. Mild age-related atrophy and chronic microvascular ischemic changes. 2. No acute/traumatic cervical spine pathology. Electronically Signed   By: Elgie Collard M.D.   On: 06/17/2022 03:16   CT Hip Right Wo Contrast  Result Date: 06/17/2022 CLINICAL DATA:  Multiple falls in the past week EXAM: CT OF THE RIGHT HIP WITHOUT CONTRAST TECHNIQUE: Multidetector CT imaging of the right hip was performed according to the standard protocol. Multiplanar CT image reconstructions were also generated. RADIATION DOSE REDUCTION: This exam was performed according to the departmental dose-optimization program which includes automated exposure control, adjustment of the mA and/or kV according to patient size and/or use of iterative reconstruction technique. COMPARISON:  Radiographs 06/16/2022 and CT of the right hip 12/12/2021 FINDINGS: Bones/Joint/Cartilage Probable nondisplaced fracture of the anterior superior acetabulum (series 2/image 47 and series 7/image 50). Demineralization. Ligaments Suboptimally assessed by CT. Muscles and Tendons No acute abnormality. Soft tissues Subcutaneous edema over the greater trochanter.  Distended bladder. IMPRESSION: Probable nondisplaced fracture of the anterior superior acetabulum Electronically Signed   By: Minerva Fester M.D.   On: 06/17/2022 03:08   DG Wrist Complete  Right  Result Date: 06/16/2022 CLINICAL DATA:  Fall, right wrist pain EXAM: RIGHT WRIST - COMPLETE 3+ VIEW COMPARISON:  None Available. FINDINGS: Normal alignment. No acute fracture or dislocation. Mild degenerate arthritis of the first carpometacarpal  joint at the base of the thumb. Remaining joint spaces are preserved. Mild soft tissue swelling dorsal to the distal radius. IMPRESSION: 1. Soft tissue swelling. No acute fracture or dislocation. Electronically Signed   By: Helyn NumbersAshesh  Parikh M.D.   On: 06/16/2022 18:07   DG Hip Unilat  With Pelvis 2-3 Views Right  Result Date: 06/16/2022 CLINICAL DATA:  Multiple falls, right hip pain EXAM: DG HIP (WITH OR WITHOUT PELVIS) 2-3V RIGHT COMPARISON:  None Available. FINDINGS: Normal alignment. No acute fracture or dislocation. Mild bilateral degenerative hip arthritis. Vascular calcifications are seen within the pelvis and medial thighs bilaterally. IMPRESSION: 1. No acute fracture or dislocation. Electronically Signed   By: Helyn NumbersAshesh  Parikh M.D.   On: 06/16/2022 18:04     Subjective: Patient seen examined bedside, resting comfortably.  Sitting in bedside chair.  No complaints.  Denies any pain.  States ready for discharge home today.  Denies headache, no fever/chills/night sweats, no nausea/vomiting/diarrhea, no chest pain, palpitations, no shortness of breath, no abdominal pain, no focal weakness, no fatigue, no paresthesias.  No acute events overnight per nursing staff.  Discharge Exam: Vitals:   06/18/22 1837 06/19/22 0829  BP: (!) 161/73 (!) 168/78  Pulse: 64 65  Resp: 16   Temp: 97.9 F (36.6 C) 97.6 F (36.4 C)  SpO2: 100% 100%   Vitals:   06/18/22 0700 06/18/22 1635 06/18/22 1837 06/19/22 0829  BP: (!) 156/77 (!) 148/71 (!) 161/73 (!) 168/78  Pulse: 79 66 64 65  Resp: 16 16 16    Temp: 98.1 F (36.7 C) 97.6 F (36.4 C) 97.9 F (36.6 C) 97.6 F (36.4 C)  TempSrc: Oral Oral Oral   SpO2: 99% 100% 100% 100%  Weight:      Height:        Physical Exam: GEN: NAD, alert and oriented x 3, elderly in appearance HEENT: NCAT, PERRL, EOMI, sclera clear, MMM PULM: CTAB w/o wheezes/crackles, normal respiratory effort, on room air CV: RRR w/o M/G/R GI: abd soft, NTND, NABS, no R/G/M MSK: no  peripheral edema, moves all extremities independently NEURO: CN II-XII intact, no focal deficits, sensation to light touch intact PSYCH: normal mood/affect    The results of significant diagnostics from this hospitalization (including imaging, microbiology, ancillary and laboratory) are listed below for reference.     Microbiology: No results found for this or any previous visit (from the past 240 hour(s)).   Labs: BNP (last 3 results) No results for input(s): "BNP" in the last 8760 hours. Basic Metabolic Panel: Recent Labs  Lab 06/16/22 1730 06/19/22 0800  NA 131* 134*  K 3.9 4.0  CL 97* 101  CO2 25 23  GLUCOSE 101* 99  BUN 25* 16  CREATININE 1.09 0.61  CALCIUM 9.1 9.0   Liver Function Tests: No results for input(s): "AST", "ALT", "ALKPHOS", "BILITOT", "PROT", "ALBUMIN" in the last 168 hours. No results for input(s): "LIPASE", "AMYLASE" in the last 168 hours. No results for input(s): "AMMONIA" in the last 168 hours. CBC: Recent Labs  Lab 06/16/22 1730 06/19/22 0800  WBC 7.5 6.9  NEUTROABS  --  4.6  HGB 11.7* 12.4*  HCT 34.4* 36.0*  MCV 93.7 91.6  PLT 224 244   Cardiac Enzymes: No results for input(s): "CKTOTAL", "CKMB", "CKMBINDEX", "TROPONINI"  in the last 168 hours. BNP: Invalid input(s): "POCBNP" CBG: No results for input(s): "GLUCAP" in the last 168 hours. D-Dimer No results for input(s): "DDIMER" in the last 72 hours. Hgb A1c No results for input(s): "HGBA1C" in the last 72 hours. Lipid Profile No results for input(s): "CHOL", "HDL", "LDLCALC", "TRIG", "CHOLHDL", "LDLDIRECT" in the last 72 hours. Thyroid function studies No results for input(s): "TSH", "T4TOTAL", "T3FREE", "THYROIDAB" in the last 72 hours.  Invalid input(s): "FREET3" Anemia work up Recent Labs    06/18/22 2127  VITAMINB12 605   Urinalysis    Component Value Date/Time   COLORURINE YELLOW (A) 06/17/2022 0330   APPEARANCEUR CLEAR (A) 06/17/2022 0330   APPEARANCEUR Clear  11/08/2021 1007   LABSPEC 1.017 06/17/2022 0330   PHURINE 6.0 06/17/2022 0330   GLUCOSEU NEGATIVE 06/17/2022 0330   HGBUR NEGATIVE 06/17/2022 0330   BILIRUBINUR NEGATIVE 06/17/2022 0330   BILIRUBINUR Negative 11/08/2021 1007   KETONESUR NEGATIVE 06/17/2022 0330   PROTEINUR NEGATIVE 06/17/2022 0330   NITRITE NEGATIVE 06/17/2022 0330   LEUKOCYTESUR NEGATIVE 06/17/2022 0330   Sepsis Labs Recent Labs  Lab 06/16/22 1730 06/19/22 0800  WBC 7.5 6.9   Microbiology No results found for this or any previous visit (from the past 240 hour(s)).   Time coordinating discharge: Over 30 minutes  SIGNED:   Alvira Philips [place], DO  [facility] 06/19/2022, 11:23 AM

## 2022-06-19 NOTE — TOC Progression Note (Signed)
Transition of Care [facility]) - Progression Note    Patient Details  Name: Nicholas Gross MRN: [id number] Date of Birth: 01-Sep-1927  Transition of Care [facility]) CM/SW Contact  Marlowe Sax, RN Phone Number: 06/19/2022, 2:18 PM  Clinical Narrative:     Spoke with the patient and verified the address [address] [place] He indicated he has no way home but does want to go home and is agreeable to safe transport  Called to arrange Safe Transport Spoke to [employer] transport  They will assist the patient into the home    Expected Discharge Plan: Home w Home Health Services Barriers to Discharge: Barriers Resolved  Expected Discharge Plan and Services   Discharge Planning Services: CM Consult   Living arrangements for the past 2 months: Mobile Home Expected Discharge Date: 06/19/22               DME Arranged: N/A DME Agency: [employer]       HH Arranged: PT HH Agency: [facility] Date HH Agency Contacted: 06/18/22 Time HH Agency Contacted: 1700 Representative spoke with at [facility] Agency: Elnita Maxwell   Social Determinants of Health (SDOH) Interventions SDOH Screenings   Food Insecurity: No Food Insecurity (06/17/2022)  Housing: Low Risk  (06/17/2022)  Transportation Needs: No Transportation Needs (06/17/2022)  Utilities: Not At Risk (06/17/2022)  Alcohol Screen: Low Risk  (05/13/2021)  Depression (PHQ2-9): Low Risk  (06/10/2022)  Financial Resource Strain: Low Risk  (05/13/2021)  Physical Activity: Unknown (05/13/2021)  Social Connections: Moderately Isolated (05/13/2021)  Stress: No Stress Concern Present (05/13/2021)  Tobacco Use: Medium Risk (06/17/2022)    Readmission Risk Interventions     No data to display

## 2022-06-20 ENCOUNTER — Telehealth: Payer: Self-pay | Admitting: *Deleted

## 2022-06-20 NOTE — Patient Outreach (Signed)
  Care Coordination [facility] Note Transition Care Management Follow-up Telephone Call Date of discharge and from where: [facility] [phone] How have you been since you were released from the hospital? I am doing good Any questions or concerns? Yes Patient was not able to see or write down his appointments. He stated that Holley Raring took him to his appointment. Patient allowed RN to call Gavin Pound fox and make her aware of all the appointments. Items Reviewed: Did the pt receive and understand the discharge instructions provided? No  Medications obtained and verified? Yes  Other? No  Any new allergies since your discharge? No  Dietary orders reviewed? No Do you have support at home? Yes   Home Care and Equipment/Supplies: Were home health services ordered? yes If so, what is the name of the agency? [facility]  Has the agency set up a time to come to the patient's home? No RN gave the information to Holley Raring to follow up Were any new equipment or medical supplies ordered?  Yes: rolling walker What is the name of the medical supply agency? [employer] Were you able to get the supplies/equipment? yes Do you have any questions related to the use of the equipment or supplies? No  Functional Questionnaire: (I = Independent and D = Dependent) ADLs: D  Bathing/Dressing- I  Meal Prep- I  Eating- I  Maintaining continence- I  Transferring/Ambulation- D uses a walker  Managing Meds- I  Follow up appointments reviewed:  PCP Hospital f/u appt confirmed? Yes  Dr Olevia Perches . Specialist Hospital f/u appt confirmed? No  Need to make about. Given information to [facility] [employer] Are transportation arrangements needed? No  Holley Raring usually takes patient to his appointments If their condition worsens, is the pt aware to call PCP or go to the Emergency Dept.? Yes Was the patient provided with contact information for the PCP's office or ED? Yes Was to pt encouraged to call back with questions or concerns?  Yes  SDOH assessments and interventions completed:   Yes   Care Coordination Interventions:  RN went over all the appointments and emailed them to Holley Raring as per her request. RN called to activate [facility] meal Plan    Encounter Outcome:  Pt. Visit Completed    Gean Maidens BSN RN [facility] Care Management [phone]

## 2022-06-26 ENCOUNTER — Telehealth: Payer: Self-pay | Admitting: Family Medicine

## 2022-06-26 ENCOUNTER — Telehealth: Payer: Self-pay | Admitting: *Deleted

## 2022-06-26 NOTE — Telephone Encounter (Signed)
Copied from [place] [id number]. Topic: General - Other >> Jun 26, 2022 11:47 AM Tiffany B wrote: Requesting  PT orders please fax to [phone] phone # [phone], evaluation for home health care in terms of a care giver, patient unable to bath himself. Caller would like a follow up call to confirm orders have been placed. Caller states family is stressed and would like orders expedited.

## 2022-06-26 NOTE — Patient Outreach (Signed)
  Care Coordination   Follow Up Visit Note   06/26/2022 Name: SEAN MALINOWSKI MRN: [id number] DOB: 19-Jan-1928  BENJERMIN KORBER is a 89 y.o. year old male who sees [name], DO for primary care. I spoke with  Roxy Cedar niece by phone today.  What matters to the patients health and wellness today?  Patient has not received physical therapy when discharged as per ordered. [facility]  states they have not received the referral from the hospital.  RN contacted [facility] and spoke with Davita regarding the order. It was noted that Deliliah Louvet spoke with [name] at [place] on [phone] at 1700.  Per Davita they did not receive the order.  RN called Dr Wynetta Emery office  spoke with Jonelle Sidle and requested order be faxed [phone] to [facility] for Physical Therapy and add Caregiver assistance due to niece stated patient is not bathing.     Care Coordination Interventions:  Yes, provided Referred to social worker. Requested new order be sent to [facility].   Follow up plan: Referral made to Social worker Wynne with appointment [phone] at 10:00.    Encounter Outcome:  Pt. Visit Completed   [place] [facility] [phone]

## 2022-06-27 ENCOUNTER — Ambulatory Visit: Payer: Self-pay | Admitting: *Deleted

## 2022-06-27 NOTE — Patient Instructions (Signed)
Visit Information  Thank you for taking time to visit with me today. Please don't hesitate to contact me if I can be of assistance to you.   Following are the goals we discussed today:   Goals Addressed             This Visit's Progress    "In home assistance"       Care Coordination Interventions: Phone call to patient's niece [name] [phone] to discuss patient's in home needs Patient's niece confirmed that she and patient's neighbor assist with patient's care Patient's niece is concerned that patient lives alone and is not able to provide care for himself due to sight and mobility limitations  Applying for medicaid for long term care discussed as well as [facility]-confirmed that due to the fact that patient has capacity, he would have to agree to out of home placement as well as in home care-this social worker agreeable to assist with this process as needed, however patient/patient's family would have to initiate this process Patient's niece agreeable to begin process of getting patient service connected through the [place] once patient is physically able Patient recently discharged from the hospital with HHPT recommendation-orders needed Phone call to [name] from [place] who now confirms that orders have been received and patient will be contacted today-contact information for patient's niece also provided This social worker to continue to follow to assist patient with community resource needs         Our next appointment is by telephone on 07/07/22 at 9am  Please call the care guide team at [phone] if you need to cancel or reschedule your appointment.   If you are experiencing a Mental Health or [place] or need someone to talk to, please call 911   Patient verbalizes understanding of instructions and care plan provided today and agrees to view in [place]. Active MyChart status and patient understanding of how to access  instructions and care plan via MyChart confirmed with patient.     Telephone follow up appointment with care management team member scheduled for: 07/07/22  Elliot Gurney, [place] Worker  [facility] Care Management [phone]

## 2022-06-27 NOTE — Telephone Encounter (Signed)
I assume orders should have come from the hospital. I have not seen him since his hospitalization. If we can get him scheduled I can get orders in if not from the hospital.

## 2022-06-27 NOTE — Patient Outreach (Signed)
  Care Coordination   Initial Visit Note   06/27/2022 Name: Nicholas Gross MRN: [id number] DOB: January 15, 1928  Nicholas Gross is a 89 y.o. year old male who sees Nicholas Roys, DO for primary care. I spoke with  Nicholas Gross by phone today.  What matters to the patients health and wellness today?  In home care    Goals Addressed             This Visit's Progress    "In home assistance"       Care Coordination Interventions: Phone call to patient's niece Nicholas Gross [phone] to discuss patient's in home needs Patient's niece confirmed that she and patient's neighbor assist with patient's care Patient's niece is concerned that patient lives alone and is not able to provide care for himself due to sight and mobility limitations  Applying for medicaid for long term care discussed as well as applying for in [facility] care [facility]-confirmed that due to the fact that patient has capacity, he would have to agree to out of home placement as well as in home care-this social worker agreeable to assist with this process as needed, however patient/patient's family would have to initiate this process Patient's niece agreeable to begin process of getting patient service connected through the [place] once patient is physically able Patient recently discharged from the hospital with HHPT recommendation-orders needed Phone call to Nicholas Gross from [place] who now confirms that orders have been received and patient will be contacted today-contact information for patient's niece also provided This social worker to continue to follow to assist patient with community resource needs         SDOH assessments and interventions completed:  Yes  SDOH Interventions Today    Flowsheet Row Most Recent Value  SDOH Interventions   Food Insecurity Interventions Intervention Not Indicated  [niece assists with meals]  Housing Interventions Intervention Not Indicated        Care Coordination  Interventions:  Yes, provided   Follow up plan: Follow up call scheduled for 07/07/22    Encounter Outcome:  Pt. Visit Completed

## 2022-06-30 NOTE — Telephone Encounter (Signed)
Pt scheduled 1/10

## 2022-07-01 DIAGNOSIS — R296 Repeated falls: Secondary | ICD-10-CM | POA: Diagnosis not present

## 2022-07-01 DIAGNOSIS — Z9181 History of falling: Secondary | ICD-10-CM | POA: Diagnosis not present

## 2022-07-01 DIAGNOSIS — M5441 Lumbago with sciatica, right side: Secondary | ICD-10-CM | POA: Diagnosis not present

## 2022-07-01 DIAGNOSIS — S32411D Displaced fracture of anterior wall of right acetabulum, subsequent encounter for fracture with routine healing: Secondary | ICD-10-CM | POA: Diagnosis not present

## 2022-07-01 DIAGNOSIS — I890 Lymphedema, not elsewhere classified: Secondary | ICD-10-CM | POA: Diagnosis not present

## 2022-07-01 DIAGNOSIS — I739 Peripheral vascular disease, unspecified: Secondary | ICD-10-CM | POA: Diagnosis not present

## 2022-07-01 DIAGNOSIS — H3530 Unspecified macular degeneration: Secondary | ICD-10-CM | POA: Diagnosis not present

## 2022-07-01 DIAGNOSIS — I2510 Atherosclerotic heart disease of native coronary artery without angina pectoris: Secondary | ICD-10-CM | POA: Diagnosis not present

## 2022-07-01 DIAGNOSIS — L03115 Cellulitis of right lower limb: Secondary | ICD-10-CM | POA: Diagnosis not present

## 2022-07-01 DIAGNOSIS — I872 Venous insufficiency (chronic) (peripheral): Secondary | ICD-10-CM | POA: Diagnosis not present

## 2022-07-01 DIAGNOSIS — E785 Hyperlipidemia, unspecified: Secondary | ICD-10-CM | POA: Diagnosis not present

## 2022-07-01 DIAGNOSIS — I10 Essential (primary) hypertension: Secondary | ICD-10-CM | POA: Diagnosis not present

## 2022-07-01 DIAGNOSIS — L03116 Cellulitis of left lower limb: Secondary | ICD-10-CM | POA: Diagnosis not present

## 2022-07-02 ENCOUNTER — Ambulatory Visit (INDEPENDENT_AMBULATORY_CARE_PROVIDER_SITE_OTHER): Payer: Medicare Other | Admitting: Family Medicine

## 2022-07-02 ENCOUNTER — Encounter: Payer: Self-pay | Admitting: Family Medicine

## 2022-07-02 VITALS — BP 169/65 | HR 61 | Temp 97.8°F | Ht 69.0 in | Wt 155.6 lb

## 2022-07-02 DIAGNOSIS — S32401A Unspecified fracture of right acetabulum, initial encounter for closed fracture: Secondary | ICD-10-CM | POA: Diagnosis not present

## 2022-07-02 DIAGNOSIS — R296 Repeated falls: Secondary | ICD-10-CM

## 2022-07-02 DIAGNOSIS — I10 Essential (primary) hypertension: Secondary | ICD-10-CM | POA: Diagnosis not present

## 2022-07-02 DIAGNOSIS — I25118 Atherosclerotic heart disease of native coronary artery with other forms of angina pectoris: Secondary | ICD-10-CM

## 2022-07-02 DIAGNOSIS — D692 Other nonthrombocytopenic purpura: Secondary | ICD-10-CM | POA: Diagnosis not present

## 2022-07-02 DIAGNOSIS — M160 Bilateral primary osteoarthritis of hip: Secondary | ICD-10-CM | POA: Diagnosis not present

## 2022-07-02 DIAGNOSIS — M1611 Unilateral primary osteoarthritis, right hip: Secondary | ICD-10-CM

## 2022-07-02 NOTE — Progress Notes (Signed)
BP (!) 169/65 (BP Location: Left Arm, Cuff Size: Normal)   Pulse 61   Temp 97.8 F (36.6 C) (Oral)   Ht 5\' 9"  (1.753 m)   Wt 155 lb 9.6 oz (70.6 kg)   SpO2 97%   BMI 22.98 kg/m    Subjective:    Patient ID: Nicholas Gross, male    DOB: 1928-04-28, 89 y.o.   MRN: [id number]  HPI: Nicholas Gross is a 89 y.o. male  Chief Complaint  Patient presents with   Weight Check   Fall    Patient was hospitalized for a fall and cracked R hip and he was discharged on 06/19/22. Patient is now complaining of L hip hurting. Patient says it hurts the worse when his moving around walking.    Medication Refill    Patient is requesting a refill on HCTZ.    Transition of Care Hospital Follow up.   Hospital/Facility: [facility] D/C Physician: Dr. [place] D/C Date: 06/19/22  Records Requested: 07/02/22 Records Received: 07/02/22 Records Reviewed: 07/02/22  Diagnoses on Discharge:   Right acetabular fracture [facility])   Multiple falls   Cellulitis of both lower extremities   Hypertension   CAD (coronary artery disease)   Lymphedema   PAD (peripheral artery disease) ([place])  Date of interactive Contact within 48 hours of discharge: 06/20/22 Contact was through: phone  Date of 7 day or 14 day face-to-face visit:  07/02/22 within 14 days  Outpatient Encounter Medications as of 07/02/2022  Medication Sig   cyanocobalamin (VITAMIN B12) 1000 MCG tablet Take 1,000 mcg by mouth daily.   Multiple Vitamins-Minerals (PRESERVISION AREDS PO) Take 1 tablet by mouth 2 (two) times daily.   rosuvastatin (CRESTOR) 10 MG tablet TAKE 1 TABLET BY MOUTH ONCE DAILY   hydrochlorothiazide (MICROZIDE) 12.5 MG capsule Take 1 capsule (12.5 mg total) by mouth daily.   lisinopril (ZESTRIL) 5 MG tablet Take 1 tablet (5 mg total) by mouth daily.   [DISCONTINUED] hydrochlorothiazide (MICROZIDE) 12.5 MG capsule Take 12.5 mg by mouth daily.   [DISCONTINUED] lisinopril (ZESTRIL) 5 MG tablet Take 1 tablet (5 mg total) by mouth 2  (two) times daily.   No facility-administered encounter medications on file as of 07/02/2022.  Per Hospitalist: "Hospital course:   Closed right acetabular fracture ([place]), stable Patient presenting to the ED with recurrent falls and was noted to have a nondisplaced right anterior superior acetabular fracture on CT head.  Patient was seen by orthopedics, Dr. Harlow Mares who recommended nonoperative management and weight-bearing as tolerates.  Pain is currently controlled and PT/OT recommend home health.  Will need outpatient follow-up with orthopedics, Dr. Harlow Mares in 12-14 days for repeat x-rays.   Frequent falls likely multifactorial etiology secondary to gait imbalance and possible polypharmacy Recently started on gabapentin by his neurologist for muscle spasms, low suspicion for acute neurologic deficit given negative CT head findings and physical exam patient is alert x 4 and able to follow all commands, UA negative, vitamin B12 within normal limits.  Does have some venous stasis in bilateral lower extremities, orthostatic vital signs were within normal limits, and no concerning findings on telemetry gabapentin was discontinued.  Seen by PT and OT with recommendation of home health.  Given his decline, multiple falls would likely benefit from assisted living placement.  Outpatient follow-up with PCP.   Bilateral blanching erythema of right lower extremities consistent with venous stasis Previously treated with ceftin as outpatient. Procalcitonin wnl.  Initially started on ceftriaxone while inpatient but now discontinued  as findings consistent with venous stasis.  Lower extremity elevation.   PAD (peripheral artery disease) (HCC) History of carotid endarterectomy Continue aspirin and rosuvastatin   CAD (coronary artery disease) No complaints of chest pain and EKG nonacute. Continue lisinopril, rosuvastatin and aspirin   Hypertension Continue lisinopril and HCTZ."  Diagnostic Tests Reviewed:   CLINICAL DATA:  Multiple falls, right hip pain   EXAM: DG HIP (WITH OR WITHOUT PELVIS) 2-3V RIGHT   COMPARISON:  None Available.   FINDINGS: Normal alignment. No acute fracture or dislocation. Mild bilateral degenerative hip arthritis. Vascular calcifications are seen within the pelvis and medial thighs bilaterally.   IMPRESSION: 1. No acute fracture or dislocation.  CLINICAL DATA:  Fall, right wrist pain   EXAM: RIGHT WRIST - COMPLETE 3+ VIEW   COMPARISON:  None Available.   FINDINGS: Normal alignment. No acute fracture or dislocation. Mild degenerate arthritis of the first carpometacarpal joint at the base of the thumb. Remaining joint spaces are preserved. Mild soft tissue swelling dorsal to the distal radius.   IMPRESSION: 1. Soft tissue swelling. No acute fracture or dislocation.  Narrative & Impression  CLINICAL DATA:  Trauma.   EXAM: CT HEAD WITHOUT CONTRAST   CT CERVICAL SPINE WITHOUT CONTRAST   TECHNIQUE: Multidetector CT imaging of the head and cervical spine was performed following the standard protocol without intravenous contrast. Multiplanar CT image reconstructions of the cervical spine were also generated.   RADIATION DOSE REDUCTION: This exam was performed according to the departmental dose-optimization program which includes automated exposure control, adjustment of the mA and/or kV according to patient size and/or use of iterative reconstruction technique.   COMPARISON:  CT dated 12/12/2021.   FINDINGS: CT HEAD FINDINGS   Brain: Mild age-related atrophy and chronic microvascular ischemic changes. There is no acute intracranial hemorrhage. No mass effect or midline shift. No extra-axial fluid collection.   Vascular: No hyperdense vessel or unexpected calcification.   Skull: Normal. Negative for fracture or focal lesion.   Sinuses/Orbits: Diffuse mucoperiosteal thickening of paranasal sinuses with opacification of multiple ethmoid air  cells. No air-fluid. The mastoid air cells are clear.   Other: None   CT CERVICAL SPINE FINDINGS   Alignment: No acute subluxation.   Skull base and vertebrae: No acute fracture.  Osteopenia.   Soft tissues and spinal canal: No prevertebral fluid or swelling. No visible canal hematoma.   Disc levels:  No acute findings.  Degenerative changes.   Upper chest: Negative.   Other: Bilateral carotid bulb calcified plaques.   IMPRESSION: 1. No acute intracranial pathology. Mild age-related atrophy and chronic microvascular ischemic changes. 2. No acute/traumatic cervical spine pathology.   CLINICAL DATA:  Trauma.   EXAM: CT HEAD WITHOUT CONTRAST   CT CERVICAL SPINE WITHOUT CONTRAST   TECHNIQUE: Multidetector CT imaging of the head and cervical spine was performed following the standard protocol without intravenous contrast. Multiplanar CT image reconstructions of the cervical spine were also generated.   RADIATION DOSE REDUCTION: This exam was performed according to the departmental dose-optimization program which includes automated exposure control, adjustment of the mA and/or kV according to patient size and/or use of iterative reconstruction technique.   COMPARISON:  CT dated 12/12/2021.   FINDINGS: CT HEAD FINDINGS   Brain: Mild age-related atrophy and chronic microvascular ischemic changes. There is no acute intracranial hemorrhage. No mass effect or midline shift. No extra-axial fluid collection.   Vascular: No hyperdense vessel or unexpected calcification.   Skull: Normal. Negative for fracture or focal  lesion.   Sinuses/Orbits: Diffuse mucoperiosteal thickening of paranasal sinuses with opacification of multiple ethmoid air cells. No air-fluid. The mastoid air cells are clear.   Other: None   CT CERVICAL SPINE FINDINGS   Alignment: No acute subluxation.   Skull base and vertebrae: No acute fracture.  Osteopenia.   Soft tissues and spinal canal: No  prevertebral fluid or swelling. No visible canal hematoma.   Disc levels:  No acute findings.  Degenerative changes.   Upper chest: Negative.   Other: Bilateral carotid bulb calcified plaques.   IMPRESSION: 1. No acute intracranial pathology. Mild age-related atrophy and chronic microvascular ischemic changes. 2. No acute/traumatic cervical spine pathology.  CLINICAL DATA:  Multiple falls in the past week   EXAM: CT OF THE RIGHT HIP WITHOUT CONTRAST   TECHNIQUE: Multidetector CT imaging of the right hip was performed according to the standard protocol. Multiplanar CT image reconstructions were also generated.   RADIATION DOSE REDUCTION: This exam was performed according to the departmental dose-optimization program which includes automated exposure control, adjustment of the mA and/or kV according to patient size and/or use of iterative reconstruction technique.   COMPARISON:  Radiographs 06/16/2022 and CT of the right hip 12/12/2021   FINDINGS: Bones/Joint/Cartilage   Probable nondisplaced fracture of the anterior superior acetabulum (series 2/image 47 and series 7/image 50). Demineralization.   Ligaments   Suboptimally assessed by CT.   Muscles and Tendons   No acute abnormality.   Soft tissues   Subcutaneous edema over the greater trochanter.  Distended bladder.   IMPRESSION: Probable nondisplaced fracture of the anterior superior acetabulum  CLINICAL DATA:  Left leg swelling   EXAM: Left LOWER EXTREMITY VENOUS DOPPLER ULTRASOUND   TECHNIQUE: Gray-scale sonography with compression, as well as color and duplex ultrasound, were performed to evaluate the deep venous system(s) from the level of the common femoral vein through the popliteal and proximal calf veins.   COMPARISON:  Lower extremity ultrasound 04/24/2022   FINDINGS: VENOUS   Normal compressibility of the common femoral, superficial femoral, and popliteal veins, as well as the visualized  calf veins. Visualized portions of profunda femoral vein and great saphenous vein unremarkable. No filling defects to suggest DVT on grayscale or color Doppler imaging. Doppler waveforms show normal direction of venous flow, normal respiratory plasticity and response to augmentation.   Limited views of the contralateral common femoral vein are unremarkable.   OTHER   None.   Limitations: none   IMPRESSION: Negative.  Disposition: Home  Consults: Ortho  Discharge Instructions:  Follow up with PCP in 1-2 weeks Follow-up with orthopedics, Dr. Harlow Mares in 12-14 days for repeat x-rays Discontinue gabapentin May need to consider assisted living arrangements in the near future Continue outpatient discussions regarding goals of care given his advanced age and debility  Disease/illness Education:  Discussed today  Home Health/Community Services Discussions/Referrals: In place/Replaced  Establishment or re-establishment of referral orders for community resources: In place/Replaced  Discussion with other health care providers: N/A  Assessment and Support of treatment regimen adherence: [place] with: Patient and his neighbors  Education for self-management, independent living, and ADLs: Discussed today   Since getting out of the hospital, Nicholas Gross has been doing OK. He notes that he is concerned about being able to live alone and the concerns that were expressed by his doctor while he was in the hospital. He has had another fall since he got out of the hospital. He notes that his hip continues to hurt. His  L hip is hurting a little more since getting out of the hospital. He does not have a follow up with the orthopedist scheduled yet.   HYPERTENSION- running well at home, but has been elevated here  Hypertension status: stable  Satisfied with current treatment? yes Duration of hypertension: chronic BP monitoring frequency:  rarely BP medication side effects:   no Medication compliance: excellent compliance Previous BP meds: lisinopril, HCTZ Aspirin: no Recurrent headaches: no Visual changes: no Palpitations: no Dyspnea: no Chest pain: no Lower extremity edema: no Dizzy/lightheaded: no  Relevant past medical, surgical, family and social history reviewed and updated as indicated. Interim medical history since our last visit reviewed. Allergies and medications reviewed and updated.  Review of Systems  Constitutional:  Positive for fatigue. Negative for activity change, appetite change, chills, diaphoresis, fever and unexpected weight change.  Respiratory: Negative.    Cardiovascular: Negative.   Gastrointestinal: Negative.   Musculoskeletal:  Positive for myalgias. Negative for arthralgias, back pain, gait problem, joint swelling, neck pain and neck stiffness.  Skin: Negative.   Neurological:  Positive for weakness. Negative for dizziness, tremors, seizures, syncope, facial asymmetry, speech difficulty, light-headedness, numbness and headaches.  Psychiatric/Behavioral: Negative.      Per HPI unless specifically indicated above     Objective:    BP (!) 169/65 (BP Location: Left Arm, Cuff Size: Normal)   Pulse 61   Temp 97.8 F (36.6 C) (Oral)   Ht 5\' 9"  (1.753 m)   Wt 155 lb 9.6 oz (70.6 kg)   SpO2 97%   BMI 22.98 kg/m   Wt Readings from Last 3 Encounters:  07/02/22 155 lb 9.6 oz (70.6 kg)  06/16/22 152 lb 8.9 oz (69.2 kg)  06/10/22 152 lb 8 oz (69.2 kg)    Physical Exam Vitals and nursing note reviewed.  Constitutional:      General: He is not in acute distress.    Appearance: Normal appearance. He is not ill-appearing, toxic-appearing or diaphoretic.  HENT:     Head: Normocephalic and atraumatic.     Right Ear: External ear normal.     Left Ear: External ear normal.     Nose: Nose normal.     Mouth/Throat:     Mouth: Mucous membranes are moist.     Pharynx: Oropharynx is clear.  Eyes:     General: No scleral  icterus.       Right eye: No discharge.        Left eye: No discharge.     Extraocular Movements: Extraocular movements intact.     Conjunctiva/sclera: Conjunctivae normal.     Pupils: Pupils are equal, round, and reactive to light.  Cardiovascular:     Rate and Rhythm: Normal rate and regular rhythm.     Pulses: Normal pulses.     Heart sounds: Normal heart sounds. No murmur heard.    No friction rub. No gallop.  Pulmonary:     Effort: Pulmonary effort is normal. No respiratory distress.     Breath sounds: Normal breath sounds. No stridor. No wheezing, rhonchi or rales.  Chest:     Chest wall: No tenderness.  Musculoskeletal:        General: Normal range of motion.     Cervical back: Normal range of motion and neck supple.  Skin:    General: Skin is warm and dry.     Capillary Refill: Capillary refill takes less than 2 seconds.     Coloration: Skin is not jaundiced or pale.  Findings: No bruising, erythema, lesion or rash.  Neurological:     General: No focal deficit present.     Mental Status: He is alert and oriented to person, place, and time. Mental status is at baseline.  Psychiatric:        Mood and Affect: Mood normal.        Behavior: Behavior normal.        Thought Content: Thought content normal.        Judgment: Judgment normal.     Results for orders placed or performed during the hospital encounter of 06/17/22  Basic metabolic panel  Result Value Ref Range   Sodium 131 (L) 135 - 145 mmol/L   Potassium 3.9 3.5 - 5.1 mmol/L   Chloride 97 (L) 98 - 111 mmol/L   CO2 25 22 - 32 mmol/L   Glucose, Bld 101 (H) 70 - 99 mg/dL   BUN 25 (H) 8 - 23 mg/dL   Creatinine, Ser 0.86 0.61 - 1.24 mg/dL   Calcium 9.1 8.9 - 76.1 mg/dL   GFR, Estimated >95 >09 mL/min   Anion gap 9 5 - 15  CBC  Result Value Ref Range   WBC 7.5 4.0 - 10.5 K/uL   RBC 3.67 (L) 4.22 - 5.81 MIL/uL   Hemoglobin 11.7 (L) 13.0 - 17.0 g/dL   HCT 32.6 (L) 71.2 - 45.8 %   MCV 93.7 80.0 - 100.0 fL    MCH 31.9 26.0 - 34.0 pg   MCHC 34.0 30.0 - 36.0 g/dL   RDW 09.9 83.3 - 82.5 %   Platelets 224 150 - 400 K/uL   nRBC 0.0 0.0 - 0.2 %  Urinalysis, Routine w reflex microscopic  Result Value Ref Range   Color, Urine YELLOW (A) YELLOW   APPearance CLEAR (A) CLEAR   Specific Gravity, Urine 1.017 1.005 - 1.030   pH 6.0 5.0 - 8.0   Glucose, UA NEGATIVE NEGATIVE mg/dL   Hgb urine dipstick NEGATIVE NEGATIVE   Bilirubin Urine NEGATIVE NEGATIVE   Ketones, ur NEGATIVE NEGATIVE mg/dL   Protein, ur NEGATIVE NEGATIVE mg/dL   Nitrite NEGATIVE NEGATIVE   Leukocytes,Ua NEGATIVE NEGATIVE   RBC / HPF 6-10 0 - 5 RBC/hpf   WBC, UA 0-5 0 - 5 WBC/hpf   Bacteria, UA NONE SEEN NONE SEEN   Squamous Epithelial / HPF NONE SEEN 0 - 5  Procalcitonin - Baseline  Result Value Ref Range   Procalcitonin <0.10 ng/mL  Vitamin B12  Result Value Ref Range   Vitamin B-12 605 180 - 914 pg/mL  CBC with Differential/Platelet  Result Value Ref Range   WBC 6.9 4.0 - 10.5 K/uL   RBC 3.93 (L) 4.22 - 5.81 MIL/uL   Hemoglobin 12.4 (L) 13.0 - 17.0 g/dL   HCT 05.3 (L) 97.6 - 73.4 %   MCV 91.6 80.0 - 100.0 fL   MCH 31.6 26.0 - 34.0 pg   MCHC 34.4 30.0 - 36.0 g/dL   RDW 19.3 79.0 - 24.0 %   Platelets 244 150 - 400 K/uL   nRBC 0.0 0.0 - 0.2 %   Neutrophils Relative % 66 %   Neutro Abs 4.6 1.7 - 7.7 K/uL   Lymphocytes Relative 17 %   Lymphs Abs 1.2 0.7 - 4.0 K/uL   Monocytes Relative 9 %   Monocytes Absolute 0.7 0.1 - 1.0 K/uL   Eosinophils Relative 6 %   Eosinophils Absolute 0.4 0.0 - 0.5 K/uL   Basophils Relative 1 %  Basophils Absolute 0.1 0.0 - 0.1 K/uL   Immature Granulocytes 1 %   Abs Immature Granulocytes 0.04 0.00 - 0.07 K/uL  Basic metabolic panel  Result Value Ref Range   Sodium 134 (L) 135 - 145 mmol/L   Potassium 4.0 3.5 - 5.1 mmol/L   Chloride 101 98 - 111 mmol/L   CO2 23 22 - 32 mmol/L   Glucose, Bld 99 70 - 99 mg/dL   BUN 16 8 - 23 mg/dL   Creatinine, Ser 0.61 0.61 - 1.24 mg/dL   Calcium 9.0  8.9 - 10.3 mg/dL   GFR, Estimated >60 >60 mL/min   Anion gap 10 5 - 15  Troponin I (High Sensitivity)  Result Value Ref Range   Troponin I (High Sensitivity) 10 <18 ng/L      Assessment & Plan:   Problem List Items Addressed This Visit       Cardiovascular and Mediastinum   Hypertension - Primary    Under good control on current regimen. Continue current regimen. Continue to monitor. Call with any concerns. Refills given.        Relevant Medications   hydrochlorothiazide (MICROZIDE) 12.5 MG capsule   lisinopril (ZESTRIL) 5 MG tablet   Other Relevant Orders   Ambulatory referral to Home Health   CAD (coronary artery disease)    Stable. Continue to follow with cardiology. Call with any concerns.       Relevant Medications   hydrochlorothiazide (MICROZIDE) 12.5 MG capsule   lisinopril (ZESTRIL) 5 MG tablet   Other Relevant Orders   Ambulatory referral to Home Health   Senile purpura ([place])    Stable. Continue to monitor. Call with any concerns.       Relevant Medications   hydrochlorothiazide (MICROZIDE) 12.5 MG capsule   lisinopril (ZESTRIL) 5 MG tablet     Musculoskeletal and Integument   Osteoarthritis of right hip    Will get him into ortho- appointment scheduled. Call with any concerns. Continue to monitor.       Relevant Orders   Ambulatory referral to [place]   Right acetabular fracture [facility])    Will get him into see ortho sooner. Will get home health out for PT. Call with any concerns. Toradol shot offered today and he declined it.       Relevant Orders   Ambulatory referral to [place]     Other   Multiple falls    Not doing great. Will get him set up with home health to help with balance and to monitor. Concern about him living alone, although he has great neighbors who check in on him often. Will get medical social work out as well. Call with any concerns. Recheck 1 month.      Other Visit Diagnoses     Recurrent falls       See discussion  under multiple falls.   Relevant Orders   Ambulatory referral to [place]        Follow up plan: Return in about 4 weeks (around 07/30/2022).   >30 minutes spent with patient and friends today

## 2022-07-04 ENCOUNTER — Other Ambulatory Visit: Payer: Self-pay

## 2022-07-04 NOTE — Telephone Encounter (Signed)
Entered in error

## 2022-07-07 ENCOUNTER — Ambulatory Visit: Payer: Self-pay | Admitting: *Deleted

## 2022-07-07 DIAGNOSIS — I872 Venous insufficiency (chronic) (peripheral): Secondary | ICD-10-CM | POA: Diagnosis not present

## 2022-07-07 DIAGNOSIS — R296 Repeated falls: Secondary | ICD-10-CM | POA: Diagnosis not present

## 2022-07-07 DIAGNOSIS — I10 Essential (primary) hypertension: Secondary | ICD-10-CM | POA: Diagnosis not present

## 2022-07-07 DIAGNOSIS — S32411D Displaced fracture of anterior wall of right acetabulum, subsequent encounter for fracture with routine healing: Secondary | ICD-10-CM | POA: Diagnosis not present

## 2022-07-07 DIAGNOSIS — Z9181 History of falling: Secondary | ICD-10-CM | POA: Diagnosis not present

## 2022-07-07 DIAGNOSIS — E785 Hyperlipidemia, unspecified: Secondary | ICD-10-CM | POA: Diagnosis not present

## 2022-07-07 DIAGNOSIS — L03115 Cellulitis of right lower limb: Secondary | ICD-10-CM | POA: Diagnosis not present

## 2022-07-07 DIAGNOSIS — H3530 Unspecified macular degeneration: Secondary | ICD-10-CM | POA: Diagnosis not present

## 2022-07-07 DIAGNOSIS — I739 Peripheral vascular disease, unspecified: Secondary | ICD-10-CM | POA: Diagnosis not present

## 2022-07-07 DIAGNOSIS — L03116 Cellulitis of left lower limb: Secondary | ICD-10-CM | POA: Diagnosis not present

## 2022-07-07 DIAGNOSIS — I2510 Atherosclerotic heart disease of native coronary artery without angina pectoris: Secondary | ICD-10-CM | POA: Diagnosis not present

## 2022-07-07 DIAGNOSIS — M5441 Lumbago with sciatica, right side: Secondary | ICD-10-CM | POA: Diagnosis not present

## 2022-07-07 DIAGNOSIS — I890 Lymphedema, not elsewhere classified: Secondary | ICD-10-CM | POA: Diagnosis not present

## 2022-07-07 NOTE — Patient Instructions (Signed)
Visit Information  Thank you for taking time to visit with me today. Please don't hesitate to contact me if I can be of assistance to you.   Following are the goals we discussed today:   Goals Addressed             This Visit's Progress    "In home assistance"       Care Coordination Interventions: Follow up phone call to patient's niece [name] [phone] to discuss patient's in home needs-left VM message Follow up phone call to patient who confirms that home health with [facility] has started-nurse assessed patient last week and will be back out today Patient confirms that his niece and neighbor assist with transportation and meals Meals on Wheels referral discussed, however patient refused need and is confident that he can prepare his own meals when needed Applying for medicaid for long term care discussed as well as [facility]-however patient is reluctant to do so stating "I will think about it" Benefit to referral for Meals on Wheels as well as the [place] discussed This social worker to continue to follow to assist patient with community resource needs         Our next appointment is by telephone on 07/21/22 at 9am  Please call the care guide team at [phone] if you need to cancel or reschedule your appointment.   If you are experiencing a Mental Health or [place] or need someone to talk to, please call [phone]   Patient verbalizes understanding of instructions and care plan provided today and agrees to view in [place]. Active MyChart status and patient understanding of how to access instructions and care plan via MyChart confirmed with patient.     Telephone follow up appointment with care management team member scheduled for: 07/21/22  Elliot Gurney, [place] Worker  [facility] Care Management [phone]

## 2022-07-07 NOTE — Patient Outreach (Signed)
  Care Coordination   Follow Up Visit Note   07/07/2022 Name: Nicholas Gross MRN: [id number] DOB: 1928/05/07  Nicholas Gross is a 89 y.o. year old male who sees [name], DO for primary care. I spoke with  Nicholas Gross by phone today.  What matters to the patients health and wellness today?  In home care needs    Goals Addressed             This Visit's Progress    "In home assistance"       Care Coordination Interventions: Follow up phone call to patient's niece Nicholas Gross [phone] to discuss patient's in home needs-left VM message Follow up phone call to patient who confirms that home health with [facility] has started-nurse assessed patient last week and will be back out today Patient confirms that his niece and neighbor assist with transportation and meals Meals on Wheels referral discussed, however patient refused need and is confident that he can prepare his own meals when needed Applying for medicaid for long term care discussed as well as applying for [facility] care [facility]-however patient is reluctant to do so stating "I will think about it" Benefit to referral for Meals on Wheels as well as the [place] discussed This social worker to continue to follow to assist patient with community resource needs         SDOH assessments and interventions completed:  Yes  SDOH Interventions Today    Flowsheet Row Most Recent Value  SDOH Interventions   Social Connections Interventions Intervention Not Indicated  [patient attends church weekly]        Care Coordination Interventions:  Yes, provided   Follow up plan: Follow up call scheduled for 07/21/22    Encounter Outcome:  Pt. Visit Completed

## 2022-07-08 ENCOUNTER — Encounter: Payer: Self-pay | Admitting: Family Medicine

## 2022-07-08 MED ORDER — HYDROCHLOROTHIAZIDE 12.5 MG PO CAPS: 12.5000 mg | ORAL_CAPSULE | Freq: Every day | ORAL | 1 refills | Status: DC

## 2022-07-08 MED ORDER — LISINOPRIL 5 MG PO TABS: 5.0000 mg | ORAL_TABLET | Freq: Every day | ORAL | 1 refills | Status: DC

## 2022-07-08 NOTE — Assessment & Plan Note (Signed)
Stable. Continue to follow with cardiology. Call with any concerns.  

## 2022-07-08 NOTE — Assessment & Plan Note (Signed)
Will get him into see ortho sooner. Will get home health out for PT. Call with any concerns. Toradol shot offered today and he declined it.

## 2022-07-08 NOTE — Assessment & Plan Note (Signed)
Under good control on current regimen. Continue current regimen. Continue to monitor. Call with any concerns. Refills given.   

## 2022-07-08 NOTE — Assessment & Plan Note (Signed)
Will get him into ortho- appointment scheduled. Call with any concerns. Continue to monitor.

## 2022-07-08 NOTE — Assessment & Plan Note (Signed)
Stable. Continue to monitor. Call with any concerns.  ?

## 2022-07-08 NOTE — Assessment & Plan Note (Signed)
Not doing great. Will get him set up with home health to help with balance and to monitor. Concern about him living alone, although he has great neighbors who check in on him often. Will get medical social work out as well. Call with any concerns. Recheck 1 month.

## 2022-07-09 DIAGNOSIS — L03116 Cellulitis of left lower limb: Secondary | ICD-10-CM | POA: Diagnosis not present

## 2022-07-09 DIAGNOSIS — I10 Essential (primary) hypertension: Secondary | ICD-10-CM | POA: Diagnosis not present

## 2022-07-09 DIAGNOSIS — Z9181 History of falling: Secondary | ICD-10-CM | POA: Diagnosis not present

## 2022-07-09 DIAGNOSIS — L03115 Cellulitis of right lower limb: Secondary | ICD-10-CM | POA: Diagnosis not present

## 2022-07-09 DIAGNOSIS — S32411D Displaced fracture of anterior wall of right acetabulum, subsequent encounter for fracture with routine healing: Secondary | ICD-10-CM | POA: Diagnosis not present

## 2022-07-09 DIAGNOSIS — I739 Peripheral vascular disease, unspecified: Secondary | ICD-10-CM | POA: Diagnosis not present

## 2022-07-09 DIAGNOSIS — I872 Venous insufficiency (chronic) (peripheral): Secondary | ICD-10-CM | POA: Diagnosis not present

## 2022-07-09 DIAGNOSIS — E785 Hyperlipidemia, unspecified: Secondary | ICD-10-CM | POA: Diagnosis not present

## 2022-07-09 DIAGNOSIS — R296 Repeated falls: Secondary | ICD-10-CM | POA: Diagnosis not present

## 2022-07-09 DIAGNOSIS — I2510 Atherosclerotic heart disease of native coronary artery without angina pectoris: Secondary | ICD-10-CM | POA: Diagnosis not present

## 2022-07-09 DIAGNOSIS — M5441 Lumbago with sciatica, right side: Secondary | ICD-10-CM | POA: Diagnosis not present

## 2022-07-09 DIAGNOSIS — H3530 Unspecified macular degeneration: Secondary | ICD-10-CM | POA: Diagnosis not present

## 2022-07-09 DIAGNOSIS — I890 Lymphedema, not elsewhere classified: Secondary | ICD-10-CM | POA: Diagnosis not present

## 2022-07-14 DIAGNOSIS — L03115 Cellulitis of right lower limb: Secondary | ICD-10-CM | POA: Diagnosis not present

## 2022-07-14 DIAGNOSIS — M5441 Lumbago with sciatica, right side: Secondary | ICD-10-CM | POA: Diagnosis not present

## 2022-07-14 DIAGNOSIS — L03116 Cellulitis of left lower limb: Secondary | ICD-10-CM | POA: Diagnosis not present

## 2022-07-14 DIAGNOSIS — S32411D Displaced fracture of anterior wall of right acetabulum, subsequent encounter for fracture with routine healing: Secondary | ICD-10-CM | POA: Diagnosis not present

## 2022-07-14 DIAGNOSIS — Z9181 History of falling: Secondary | ICD-10-CM | POA: Diagnosis not present

## 2022-07-14 DIAGNOSIS — I872 Venous insufficiency (chronic) (peripheral): Secondary | ICD-10-CM | POA: Diagnosis not present

## 2022-07-14 DIAGNOSIS — I2510 Atherosclerotic heart disease of native coronary artery without angina pectoris: Secondary | ICD-10-CM | POA: Diagnosis not present

## 2022-07-14 DIAGNOSIS — R296 Repeated falls: Secondary | ICD-10-CM | POA: Diagnosis not present

## 2022-07-14 DIAGNOSIS — I890 Lymphedema, not elsewhere classified: Secondary | ICD-10-CM | POA: Diagnosis not present

## 2022-07-14 DIAGNOSIS — I10 Essential (primary) hypertension: Secondary | ICD-10-CM | POA: Diagnosis not present

## 2022-07-14 DIAGNOSIS — E785 Hyperlipidemia, unspecified: Secondary | ICD-10-CM | POA: Diagnosis not present

## 2022-07-14 DIAGNOSIS — I739 Peripheral vascular disease, unspecified: Secondary | ICD-10-CM | POA: Diagnosis not present

## 2022-07-14 DIAGNOSIS — H3530 Unspecified macular degeneration: Secondary | ICD-10-CM | POA: Diagnosis not present

## 2022-07-16 ENCOUNTER — Telehealth: Payer: Self-pay | Admitting: Family Medicine

## 2022-07-16 DIAGNOSIS — I890 Lymphedema, not elsewhere classified: Secondary | ICD-10-CM | POA: Diagnosis not present

## 2022-07-16 DIAGNOSIS — I10 Essential (primary) hypertension: Secondary | ICD-10-CM | POA: Diagnosis not present

## 2022-07-16 DIAGNOSIS — M5441 Lumbago with sciatica, right side: Secondary | ICD-10-CM | POA: Diagnosis not present

## 2022-07-16 DIAGNOSIS — L03116 Cellulitis of left lower limb: Secondary | ICD-10-CM | POA: Diagnosis not present

## 2022-07-16 DIAGNOSIS — I2510 Atherosclerotic heart disease of native coronary artery without angina pectoris: Secondary | ICD-10-CM | POA: Diagnosis not present

## 2022-07-16 DIAGNOSIS — Z9181 History of falling: Secondary | ICD-10-CM | POA: Diagnosis not present

## 2022-07-16 DIAGNOSIS — R296 Repeated falls: Secondary | ICD-10-CM | POA: Diagnosis not present

## 2022-07-16 DIAGNOSIS — I872 Venous insufficiency (chronic) (peripheral): Secondary | ICD-10-CM | POA: Diagnosis not present

## 2022-07-16 DIAGNOSIS — I739 Peripheral vascular disease, unspecified: Secondary | ICD-10-CM | POA: Diagnosis not present

## 2022-07-16 DIAGNOSIS — E785 Hyperlipidemia, unspecified: Secondary | ICD-10-CM | POA: Diagnosis not present

## 2022-07-16 DIAGNOSIS — L03115 Cellulitis of right lower limb: Secondary | ICD-10-CM | POA: Diagnosis not present

## 2022-07-16 DIAGNOSIS — S32411D Displaced fracture of anterior wall of right acetabulum, subsequent encounter for fracture with routine healing: Secondary | ICD-10-CM | POA: Diagnosis not present

## 2022-07-16 DIAGNOSIS — H3530 Unspecified macular degeneration: Secondary | ICD-10-CM | POA: Diagnosis not present

## 2022-07-16 NOTE — Telephone Encounter (Signed)
OK for verbal orders for wound care- please let me know if they need more.

## 2022-07-16 NOTE — Telephone Encounter (Signed)
Spoke with Nicholas Gross and provided her with verbal OK per Dr.Johnson for the patient. Nicholas Gross says she will send over more orders to be signed and faxed back to her. Verbalized understanding.

## 2022-07-16 NOTE — Telephone Encounter (Signed)
Reporting Lower legs are starting to blister and redness/ needs a verbal wound order or faxed to [phone] / please advise

## 2022-07-17 ENCOUNTER — Ambulatory Visit: Payer: Self-pay | Admitting: *Deleted

## 2022-07-17 NOTE — Telephone Encounter (Signed)
Summary:   Per agent: "blisters on lower left leg   [name] pt's niece stated pt has blisters and broke out on the lower left leg. [name] stated Dr.Johnson should be aware because the home health nurse covered the blisters. However, [name] stated she believes pt needs some antibiotics. [name] also mentioned it looks tight and red.  Seeking clinical advice."

## 2022-07-17 NOTE — Telephone Encounter (Signed)
[name] niece calling, not on DPR, pt is present. Questioning if wound care involved oral antibiotics. States Pt has H/O of these break outs, last time was hospitalized with IV ATBs. States cannot see what leg looks like as HHN bandaged areas.  Please advise. Thank you.

## 2022-07-17 NOTE — Telephone Encounter (Signed)
Needs appt for abx

## 2022-07-18 ENCOUNTER — Telehealth: Payer: Self-pay | Admitting: Family Medicine

## 2022-07-18 NOTE — Telephone Encounter (Signed)
  Chief Complaint: blisters Symptoms: blisters on LLE, redness Frequency: ongoing for several days  Pertinent Negatives: NA Disposition: [] ED /[] Urgent Care (no appt availability in office) / [x] Appointment(In office/virtual)/ []  [facility]/ [] Home Care/ [] Refused Recommended Disposition /[] [facility]/ [x]  Follow-up with PCP Additional Notes: Tammy, pt's niece calling back to FU from abx request for LLE blisters. She is upset that no one FU with her yesterday or today so far. Did advise that provider is seeing pt's and currently practice gone to lunch. Advised her of Dr. Wynetta Emery wanting pt to have appt for eval of abx need. She is upset that pt needed appt d/t recently being in hospital for cellulitis and required IV ABT. Scheduled appt for 07/22/21 at 1340 d/t first open with PCP. She is concerned that pt will develop infection and no one is concerned as much as her. Did advise she can take pt go UC/ED if needed if concerned for infection. She didn't want to do that d/t pt age and dislikes going to ED but if sx get worse would go to UC if needed. Also recommended she can reach out to [facility] to see if RN can come back out and re-eval dressings and legs to see if abx is needed and they may be able to request from [facility] standpoint or send pics of blisters. She verbalized understanding and agreed to keep appt and if appt comes available sooner to reach out to her because she is over pt care now.

## 2022-07-18 NOTE — Telephone Encounter (Signed)
Reason for Disposition . [1] Looks infected (spreading redness, red streak, pus) AND [2] no fever  Answer Assessment - Initial Assessment Questions 1. APPEARANCE of BLISTER: "What does it look like?"     Small blisters on LLE and elbow  2. SIZE: "How large is the blister?" (inches, cm or compare to coins)     Unsure, legs are wrapped now by [facility] RN  3. LOCATION: "Where are the blisters located?"      On LLE   5. CAUSE: "What do you think caused the blister?"     Pt had cellulitis and was hospitalized for IV ABT previously  Protocols used: Blister - Foot and Hand-A-AH

## 2022-07-18 NOTE — Telephone Encounter (Signed)
Pt scheduled 1/30

## 2022-07-18 NOTE — Telephone Encounter (Signed)
OK for verbal orders. I'm seeing him on Tuesday to look at the legs- so if they can leave them unwrapped if they can arrange their time so I can see the legs, that'd be great.

## 2022-07-18 NOTE — Telephone Encounter (Signed)
Home Health services called to share some health updates for the patient. He popped a blister on his left elbow however was advised not to. Legs dont feel warm, they are reddened and swollen. They applied a dressing to the patients blister, the uniboot. Wondering if blistering is related to the excessive swelling  Best contact: [phone] Kathlee Nations from [place]. Fax: [phone]   Nurse that saw him on Wednesday says he could have possible cellulitis. Not worried because his redness is not warm.   Wondering if the home health services could have PRN orders to monitor him this week, just to help prevent him from being hospitalized. Would need PRN orders and why. "For signs and symptoms of infection related to leg wounds"

## 2022-07-18 NOTE — Telephone Encounter (Signed)
Noted  

## 2022-07-21 ENCOUNTER — Ambulatory Visit: Payer: Self-pay | Admitting: *Deleted

## 2022-07-21 NOTE — Patient Instructions (Signed)
Visit Information  Thank you for taking time to visit with me today. Please don't hesitate to contact me if I can be of assistance to you.   Following are the goals we discussed today:   Goals Addressed             This Visit's Progress    "In home assistance"       Care Coordination Interventions: Follow up phone call to patient's niece [name] [phone] to discuss patient's in home needs Patient's niece confirms that patient is receiving [facility] services, however is not sure of visit schedule Request made for [facility] aid being added to services, to prevent falls while bathing-phone call to [facility] who confirms that they do not have staffing for a bath aid at this time. Patient's niece continues to work on establishing patient with the [place] for in home care services Private duty care discussed-out of pocket resources emailed to patient's niece at [email or web address] This social worker to continue to follow to assist patient with community resource needs as needed          Please call the care guide team at [phone] if you need to cancel or reschedule your appointment.   If you are experiencing a Mental Health or [place] or need someone to talk to, please call 911   Patient verbalizes understanding of instructions and care plan provided today and agrees to view in [place]. Active MyChart status and patient understanding of how to access instructions and care plan via MyChart confirmed with patient.     No further follow up required: patient/patient's niece to contact this Education officer, museum with any additional community resource needs  [employer], [place] Worker  [facility] Care Management [phone]

## 2022-07-21 NOTE — Patient Outreach (Signed)
  Care Coordination   Follow Up Visit Note   07/21/2022 Name: Nicholas Gross MRN: [id number] DOB: [birth year]  Nicholas Gross is a 89 y.o. year old male who sees Nicholas Roys, Nicholas Gross for primary care. I spoke with  Nicholas Gross niece by phone today.  What matters to the patients health and wellness today?  In home care options    Goals Addressed             This Visit's Progress    "In home assistance"       Care Coordination Interventions: Follow up phone call to patient's niece Nicholas Gross [phone] to discuss patient's in home needs Patient's niece confirms that patient is receiving [facility] services, however is not sure of visit schedule Request made for [facility] aid being added to services, to prevent falls while bathing-phone call to [facility] who confirms that they Nicholas Gross not have staffing for a bath aid at this time. Patient's niece continues to work on establishing patient with the [facility] for in home care services Private duty care discussed-out of pocket resources emailed to patient's niece at Nicholas Gross This social worker to continue to follow to assist patient with community resource needs as needed         SDOH assessments and interventions completed:  No     Care Coordination Interventions:  Yes, provided   Follow up plan: No further intervention required.   Encounter Outcome:  Pt. Visit Completed

## 2022-07-22 ENCOUNTER — Encounter: Payer: Self-pay | Admitting: Family Medicine

## 2022-07-22 ENCOUNTER — Ambulatory Visit (INDEPENDENT_AMBULATORY_CARE_PROVIDER_SITE_OTHER): Payer: Medicare Other | Admitting: Family Medicine

## 2022-07-22 VITALS — BP 164/80 | HR 56 | Temp 97.8°F | Ht 69.0 in | Wt 155.4 lb

## 2022-07-22 DIAGNOSIS — E785 Hyperlipidemia, unspecified: Secondary | ICD-10-CM | POA: Diagnosis not present

## 2022-07-22 DIAGNOSIS — L03116 Cellulitis of left lower limb: Secondary | ICD-10-CM | POA: Diagnosis not present

## 2022-07-22 DIAGNOSIS — L299 Pruritus, unspecified: Secondary | ICD-10-CM | POA: Diagnosis not present

## 2022-07-22 DIAGNOSIS — R296 Repeated falls: Secondary | ICD-10-CM | POA: Diagnosis not present

## 2022-07-22 DIAGNOSIS — I2510 Atherosclerotic heart disease of native coronary artery without angina pectoris: Secondary | ICD-10-CM | POA: Diagnosis not present

## 2022-07-22 DIAGNOSIS — I872 Venous insufficiency (chronic) (peripheral): Secondary | ICD-10-CM | POA: Diagnosis not present

## 2022-07-22 DIAGNOSIS — I739 Peripheral vascular disease, unspecified: Secondary | ICD-10-CM | POA: Diagnosis not present

## 2022-07-22 DIAGNOSIS — H3530 Unspecified macular degeneration: Secondary | ICD-10-CM | POA: Diagnosis not present

## 2022-07-22 DIAGNOSIS — M5441 Lumbago with sciatica, right side: Secondary | ICD-10-CM | POA: Diagnosis not present

## 2022-07-22 DIAGNOSIS — I10 Essential (primary) hypertension: Secondary | ICD-10-CM | POA: Diagnosis not present

## 2022-07-22 DIAGNOSIS — L03115 Cellulitis of right lower limb: Secondary | ICD-10-CM | POA: Diagnosis not present

## 2022-07-22 DIAGNOSIS — I890 Lymphedema, not elsewhere classified: Secondary | ICD-10-CM | POA: Diagnosis not present

## 2022-07-22 DIAGNOSIS — Z9181 History of falling: Secondary | ICD-10-CM | POA: Diagnosis not present

## 2022-07-22 DIAGNOSIS — S32411D Displaced fracture of anterior wall of right acetabulum, subsequent encounter for fracture with routine healing: Secondary | ICD-10-CM | POA: Diagnosis not present

## 2022-07-22 MED ORDER — DOXYCYCLINE HYCLATE 100 MG PO TABS: 100.0000 mg | ORAL_TABLET | Freq: Two times a day (BID) | ORAL | 0 refills | Status: DC

## 2022-07-22 MED ORDER — HYDROXYZINE PAMOATE 25 MG PO CAPS: 25.0000 mg | ORAL_CAPSULE | Freq: Three times a day (TID) | ORAL | 1 refills | Status: DC | PRN

## 2022-07-22 NOTE — Progress Notes (Signed)
BP (!) 164/80   Pulse (!) 56   Temp 97.8 F (36.6 C) (Oral)   Ht 5\' 9"  (1.753 m)   Wt 155 lb 6.4 oz (70.5 kg)   SpO2 100%   BMI 22.95 kg/m    Subjective:    Patient ID: Nicholas Gross, male    DOB: [birth year], 89 y.o.   MRN: [id number]  HPI: Nicholas Gross is a 89 y.o. male  Chief Complaint  Patient presents with   Blister    Patient says the blisters just showed up and now they are draining. Patient niece says Home Health nurse wrapped it last Wednesday and it has not been re-wrapped since Monday. Patient niece says she has cleaned it with Peroxide.    SKIN INFECTION Duration: about a week Location: Lower legs History of trauma in area: yes Pain: no Quality: tight Severity: mild Redness: yes Swelling: yes Oozing: no Pus: no Fevers: no Nausea/vomiting: no Status: better Treatments attempted:wrapping Tetanus: UTD  Relevant past medical, surgical, family and social history reviewed and updated as indicated. Interim medical history since our last visit reviewed. Allergies and medications reviewed and updated.  Review of Systems  Constitutional: Negative.   Respiratory: Negative.    Cardiovascular:  Positive for leg swelling. Negative for chest pain and palpitations.  Gastrointestinal: Negative.   Musculoskeletal: Negative.   Skin:  Positive for color change and wound. Negative for pallor and rash.       itching  Psychiatric/Behavioral: Negative.      Per HPI unless specifically indicated above     Objective:    BP (!) 164/80   Pulse (!) 56   Temp 97.8 F (36.6 C) (Oral)   Ht 5\' 9"  (1.753 m)   Wt 155 lb 6.4 oz (70.5 kg)   SpO2 100%   BMI 22.95 kg/m   Wt Readings from Last 3 Encounters:  07/22/22 155 lb 6.4 oz (70.5 kg)  07/02/22 155 lb 9.6 oz (70.6 kg)  06/16/22 152 lb 8.9 oz (69.2 kg)    Physical Exam Vitals and nursing note reviewed.  Constitutional:      General: He is not in acute distress.    Appearance: Normal appearance. He is not  ill-appearing, toxic-appearing or diaphoretic.  HENT:     Head: Normocephalic and atraumatic.     Right Ear: External ear normal.     Left Ear: External ear normal.     Nose: Nose normal.     Mouth/Throat:     Mouth: Mucous membranes are moist.     Pharynx: Oropharynx is clear.  Eyes:     General: No scleral icterus.       Right eye: No discharge.        Left eye: No discharge.     Extraocular Movements: Extraocular movements intact.     Conjunctiva/sclera: Conjunctivae normal.     Pupils: Pupils are equal, round, and reactive to light.  Cardiovascular:     Rate and Rhythm: Normal rate and regular rhythm.     Pulses: Normal pulses.     Heart sounds: Normal heart sounds. No murmur heard.    No friction rub. No gallop.  Pulmonary:     Effort: Pulmonary effort is normal. No respiratory distress.     Breath sounds: Normal breath sounds. No stridor. No wheezing, rhonchi or rales.  Chest:     Chest wall: No tenderness.  Musculoskeletal:        General: Normal range of motion.  Cervical back: Normal range of motion and neck supple.     Comments: Redness and heat in L leg, open ulcers, no blisters  Skin:    General: Skin is warm and dry.     Capillary Refill: Capillary refill takes less than 2 seconds.     Coloration: Skin is not jaundiced or pale.     Findings: No bruising, erythema, lesion or rash.  Neurological:     General: No focal deficit present.     Mental Status: He is alert and oriented to person, place, and time. Mental status is at baseline.  Psychiatric:        Mood and Affect: Mood normal.        Behavior: Behavior normal.        Thought Content: Thought content normal.        Judgment: Judgment normal.     Results for orders placed or performed during the hospital encounter of 63/78/58  Basic metabolic panel  Result Value Ref Range   Sodium 131 (L) 135 - 145 mmol/L   Potassium 3.9 3.5 - 5.1 mmol/L   Chloride 97 (L) 98 - 111 mmol/L   CO2 25 22 - 32 mmol/L    Glucose, Bld 101 (H) 70 - 99 mg/dL   BUN 25 (H) 8 - 23 mg/dL   Creatinine, Ser 1.09 0.61 - 1.24 mg/dL   Calcium 9.1 8.9 - 10.3 mg/dL   GFR, Estimated >60 >60 mL/min   Anion gap 9 5 - 15  CBC  Result Value Ref Range   WBC 7.5 4.0 - 10.5 K/uL   RBC 3.67 (L) 4.22 - 5.81 MIL/uL   Hemoglobin 11.7 (L) 13.0 - 17.0 g/dL   HCT 34.4 (L) 39.0 - 52.0 %   MCV 93.7 80.0 - 100.0 fL   MCH 31.9 26.0 - 34.0 pg   MCHC 34.0 30.0 - 36.0 g/dL   RDW 11.6 11.5 - 15.5 %   Platelets 224 150 - 400 K/uL   nRBC 0.0 0.0 - 0.2 %  Urinalysis, Routine w reflex microscopic  Result Value Ref Range   Color, Urine YELLOW (A) YELLOW   APPearance CLEAR (A) CLEAR   Specific Gravity, Urine 1.017 1.005 - 1.030   pH 6.0 5.0 - 8.0   Glucose, UA NEGATIVE NEGATIVE mg/dL   Hgb urine dipstick NEGATIVE NEGATIVE   Bilirubin Urine NEGATIVE NEGATIVE   Ketones, ur NEGATIVE NEGATIVE mg/dL   Protein, ur NEGATIVE NEGATIVE mg/dL   Nitrite NEGATIVE NEGATIVE   Leukocytes,Ua NEGATIVE NEGATIVE   RBC / HPF 6-10 0 - 5 RBC/hpf   WBC, UA 0-5 0 - 5 WBC/hpf   Bacteria, UA NONE SEEN NONE SEEN   Squamous Epithelial / HPF NONE SEEN 0 - 5  Procalcitonin - Baseline  Result Value Ref Range   Procalcitonin <0.10 ng/mL  Vitamin B12  Result Value Ref Range   Vitamin B-12 605 180 - 914 pg/mL  CBC with Differential/Platelet  Result Value Ref Range   WBC 6.9 4.0 - 10.5 K/uL   RBC 3.93 (L) 4.22 - 5.81 MIL/uL   Hemoglobin 12.4 (L) 13.0 - 17.0 g/dL   HCT 36.0 (L) 39.0 - 52.0 %   MCV 91.6 80.0 - 100.0 fL   MCH 31.6 26.0 - 34.0 pg   MCHC 34.4 30.0 - 36.0 g/dL   RDW 11.5 11.5 - 15.5 %   Platelets 244 150 - 400 K/uL   nRBC 0.0 0.0 - 0.2 %   Neutrophils Relative % 66 %  Neutro Abs 4.6 1.7 - 7.7 K/uL   Lymphocytes Relative 17 %   Lymphs Abs 1.2 0.7 - 4.0 K/uL   Monocytes Relative 9 %   Monocytes Absolute 0.7 0.1 - 1.0 K/uL   Eosinophils Relative 6 %   Eosinophils Absolute 0.4 0.0 - 0.5 K/uL   Basophils Relative 1 %   Basophils Absolute  0.1 0.0 - 0.1 K/uL   Immature Granulocytes 1 %   Abs Immature Granulocytes 0.04 0.00 - 0.07 K/uL  Basic metabolic panel  Result Value Ref Range   Sodium 134 (L) 135 - 145 mmol/L   Potassium 4.0 3.5 - 5.1 mmol/L   Chloride 101 98 - 111 mmol/L   CO2 23 22 - 32 mmol/L   Glucose, Bld 99 70 - 99 mg/dL   BUN 16 8 - 23 mg/dL   Creatinine, Ser 0.61 0.61 - 1.24 mg/dL   Calcium 9.0 8.9 - 10.3 mg/dL   GFR, Estimated >60 >60 mL/min   Anion gap 10 5 - 15  Troponin I (High Sensitivity)  Result Value Ref Range   Troponin I (High Sensitivity) 10 <18 ng/L      Assessment & Plan:   Problem List Items Addressed This Visit       Other   Cellulitis of both lower extremities - Primary   Other Visit Diagnoses     Itching       Will chang benadryl to hydroxyzine to help with sedation. Call with any concerns.        Follow up plan: Return in about 3 weeks (around 08/12/2022) for OK to cancel appt next visit.

## 2022-07-22 NOTE — Telephone Encounter (Signed)
Spoke with Kathlee Nations to provide verbal OK for order for patient per Dr.Johnson. Patient was also made aware for today's appointment of Dr.Johnson's recommendations. Patient verbalized understanding.

## 2022-07-25 ENCOUNTER — Encounter: Payer: Self-pay | Admitting: Family Medicine

## 2022-07-25 ENCOUNTER — Telehealth: Payer: Self-pay | Admitting: Family Medicine

## 2022-07-25 DIAGNOSIS — E785 Hyperlipidemia, unspecified: Secondary | ICD-10-CM | POA: Diagnosis not present

## 2022-07-25 DIAGNOSIS — L03115 Cellulitis of right lower limb: Secondary | ICD-10-CM | POA: Diagnosis not present

## 2022-07-25 DIAGNOSIS — R296 Repeated falls: Secondary | ICD-10-CM | POA: Diagnosis not present

## 2022-07-25 DIAGNOSIS — M5441 Lumbago with sciatica, right side: Secondary | ICD-10-CM | POA: Diagnosis not present

## 2022-07-25 DIAGNOSIS — I739 Peripheral vascular disease, unspecified: Secondary | ICD-10-CM | POA: Diagnosis not present

## 2022-07-25 DIAGNOSIS — I2510 Atherosclerotic heart disease of native coronary artery without angina pectoris: Secondary | ICD-10-CM | POA: Diagnosis not present

## 2022-07-25 DIAGNOSIS — Z9181 History of falling: Secondary | ICD-10-CM | POA: Diagnosis not present

## 2022-07-25 DIAGNOSIS — I872 Venous insufficiency (chronic) (peripheral): Secondary | ICD-10-CM | POA: Diagnosis not present

## 2022-07-25 DIAGNOSIS — H3530 Unspecified macular degeneration: Secondary | ICD-10-CM | POA: Diagnosis not present

## 2022-07-25 DIAGNOSIS — I890 Lymphedema, not elsewhere classified: Secondary | ICD-10-CM | POA: Diagnosis not present

## 2022-07-25 DIAGNOSIS — S32411D Displaced fracture of anterior wall of right acetabulum, subsequent encounter for fracture with routine healing: Secondary | ICD-10-CM | POA: Diagnosis not present

## 2022-07-25 DIAGNOSIS — L03116 Cellulitis of left lower limb: Secondary | ICD-10-CM | POA: Diagnosis not present

## 2022-07-25 DIAGNOSIS — I10 Essential (primary) hypertension: Secondary | ICD-10-CM | POA: Diagnosis not present

## 2022-07-25 NOTE — Telephone Encounter (Signed)
This encounter was created in error - please disregard.

## 2022-07-25 NOTE — Telephone Encounter (Signed)
They can start wrapping again- I just didn't want them wrapped so I could see them when he came in.

## 2022-07-25 NOTE — Telephone Encounter (Signed)
Collie Siad from [place] called stated that pt told not to wrap his legs per Dr. Wynetta Emery. Read a note on 07/18/22 that Dr Wynetta Emery wanted to have them unwrapped for the upcoming visit. OV note does not indicate any information regarding the wrap.  Collie Siad stated that the wounds have scabbed over and no swelling detected.  Pt c/o right side of hip pain. Per note dated 06/23/22 was going to refer to orthopedics. Routing message to office for review.

## 2022-07-25 NOTE — Telephone Encounter (Signed)
Collie Siad from [facility] called n states patient doesn't want his legs wrapped, and she doesn feel comfortale makin him. She just wanted this info noted.

## 2022-07-25 NOTE — Telephone Encounter (Signed)
Spoke with Kathlee Nations from [facility] to inform her that patient may start back wrapping his leg. Kathlee Nations verbalized understanding and said that she would let Collie Siad and patient and family know.

## 2022-07-28 ENCOUNTER — Ambulatory Visit: Payer: Medicare Other | Admitting: Family Medicine

## 2022-07-28 DIAGNOSIS — I2510 Atherosclerotic heart disease of native coronary artery without angina pectoris: Secondary | ICD-10-CM | POA: Diagnosis not present

## 2022-07-28 DIAGNOSIS — I10 Essential (primary) hypertension: Secondary | ICD-10-CM | POA: Diagnosis not present

## 2022-07-28 DIAGNOSIS — Z9181 History of falling: Secondary | ICD-10-CM | POA: Diagnosis not present

## 2022-07-28 DIAGNOSIS — I872 Venous insufficiency (chronic) (peripheral): Secondary | ICD-10-CM | POA: Diagnosis not present

## 2022-07-28 DIAGNOSIS — R296 Repeated falls: Secondary | ICD-10-CM | POA: Diagnosis not present

## 2022-07-28 DIAGNOSIS — L03116 Cellulitis of left lower limb: Secondary | ICD-10-CM | POA: Diagnosis not present

## 2022-07-28 DIAGNOSIS — E785 Hyperlipidemia, unspecified: Secondary | ICD-10-CM | POA: Diagnosis not present

## 2022-07-28 DIAGNOSIS — I890 Lymphedema, not elsewhere classified: Secondary | ICD-10-CM | POA: Diagnosis not present

## 2022-07-28 DIAGNOSIS — H3530 Unspecified macular degeneration: Secondary | ICD-10-CM | POA: Diagnosis not present

## 2022-07-28 DIAGNOSIS — S32411D Displaced fracture of anterior wall of right acetabulum, subsequent encounter for fracture with routine healing: Secondary | ICD-10-CM | POA: Diagnosis not present

## 2022-07-28 DIAGNOSIS — M5441 Lumbago with sciatica, right side: Secondary | ICD-10-CM | POA: Diagnosis not present

## 2022-07-28 DIAGNOSIS — I739 Peripheral vascular disease, unspecified: Secondary | ICD-10-CM | POA: Diagnosis not present

## 2022-07-28 DIAGNOSIS — L03115 Cellulitis of right lower limb: Secondary | ICD-10-CM | POA: Diagnosis not present

## 2022-07-30 ENCOUNTER — Ambulatory Visit: Payer: Medicare Other | Admitting: Family Medicine

## 2022-08-01 ENCOUNTER — Telehealth: Payer: Self-pay | Admitting: Family Medicine

## 2022-08-01 DIAGNOSIS — L03116 Cellulitis of left lower limb: Secondary | ICD-10-CM | POA: Diagnosis not present

## 2022-08-01 DIAGNOSIS — I739 Peripheral vascular disease, unspecified: Secondary | ICD-10-CM | POA: Diagnosis not present

## 2022-08-01 DIAGNOSIS — M5441 Lumbago with sciatica, right side: Secondary | ICD-10-CM | POA: Diagnosis not present

## 2022-08-01 DIAGNOSIS — E785 Hyperlipidemia, unspecified: Secondary | ICD-10-CM | POA: Diagnosis not present

## 2022-08-01 DIAGNOSIS — Z9181 History of falling: Secondary | ICD-10-CM | POA: Diagnosis not present

## 2022-08-01 DIAGNOSIS — S32411D Displaced fracture of anterior wall of right acetabulum, subsequent encounter for fracture with routine healing: Secondary | ICD-10-CM | POA: Diagnosis not present

## 2022-08-01 DIAGNOSIS — H3530 Unspecified macular degeneration: Secondary | ICD-10-CM | POA: Diagnosis not present

## 2022-08-01 DIAGNOSIS — I2510 Atherosclerotic heart disease of native coronary artery without angina pectoris: Secondary | ICD-10-CM | POA: Diagnosis not present

## 2022-08-01 DIAGNOSIS — I872 Venous insufficiency (chronic) (peripheral): Secondary | ICD-10-CM | POA: Diagnosis not present

## 2022-08-01 DIAGNOSIS — L03115 Cellulitis of right lower limb: Secondary | ICD-10-CM | POA: Diagnosis not present

## 2022-08-01 DIAGNOSIS — I10 Essential (primary) hypertension: Secondary | ICD-10-CM | POA: Diagnosis not present

## 2022-08-01 DIAGNOSIS — R296 Repeated falls: Secondary | ICD-10-CM | POA: Diagnosis not present

## 2022-08-01 DIAGNOSIS — I890 Lymphedema, not elsewhere classified: Secondary | ICD-10-CM | POA: Diagnosis not present

## 2022-08-01 NOTE — Telephone Encounter (Signed)
Home health nurse called reporting that the patient does not want his legs wrapped, they are not swollen. He wears diabetic socks. He has about 4 spots that have scabbed, they are still a little warm to the touch. He has about 3 tablets left. Wants to know if PCP wants to call in more ABX?  Collie Siad from [place] contact: [phone]

## 2022-08-07 NOTE — Telephone Encounter (Signed)
Can we see how he's feeling?

## 2022-08-08 DIAGNOSIS — E785 Hyperlipidemia, unspecified: Secondary | ICD-10-CM | POA: Diagnosis not present

## 2022-08-08 DIAGNOSIS — I872 Venous insufficiency (chronic) (peripheral): Secondary | ICD-10-CM | POA: Diagnosis not present

## 2022-08-08 DIAGNOSIS — I10 Essential (primary) hypertension: Secondary | ICD-10-CM | POA: Diagnosis not present

## 2022-08-08 DIAGNOSIS — L03115 Cellulitis of right lower limb: Secondary | ICD-10-CM | POA: Diagnosis not present

## 2022-08-08 DIAGNOSIS — I890 Lymphedema, not elsewhere classified: Secondary | ICD-10-CM | POA: Diagnosis not present

## 2022-08-08 DIAGNOSIS — H3530 Unspecified macular degeneration: Secondary | ICD-10-CM | POA: Diagnosis not present

## 2022-08-08 DIAGNOSIS — I2510 Atherosclerotic heart disease of native coronary artery without angina pectoris: Secondary | ICD-10-CM | POA: Diagnosis not present

## 2022-08-08 DIAGNOSIS — L03116 Cellulitis of left lower limb: Secondary | ICD-10-CM | POA: Diagnosis not present

## 2022-08-08 DIAGNOSIS — R296 Repeated falls: Secondary | ICD-10-CM | POA: Diagnosis not present

## 2022-08-08 DIAGNOSIS — M5441 Lumbago with sciatica, right side: Secondary | ICD-10-CM | POA: Diagnosis not present

## 2022-08-08 DIAGNOSIS — I739 Peripheral vascular disease, unspecified: Secondary | ICD-10-CM | POA: Diagnosis not present

## 2022-08-08 DIAGNOSIS — S32411D Displaced fracture of anterior wall of right acetabulum, subsequent encounter for fracture with routine healing: Secondary | ICD-10-CM | POA: Diagnosis not present

## 2022-08-08 DIAGNOSIS — Z9181 History of falling: Secondary | ICD-10-CM | POA: Diagnosis not present

## 2022-08-08 NOTE — Telephone Encounter (Signed)
Called patient to follow up to see how he is doing per Dr.Johnson. Unable to leave voice message as patient voicemail is full.    OK for PEC to follow up if patient calls back.

## 2022-08-11 ENCOUNTER — Telehealth: Payer: Self-pay

## 2022-08-11 DIAGNOSIS — I10 Essential (primary) hypertension: Secondary | ICD-10-CM | POA: Diagnosis not present

## 2022-08-11 DIAGNOSIS — M5441 Lumbago with sciatica, right side: Secondary | ICD-10-CM | POA: Diagnosis not present

## 2022-08-11 DIAGNOSIS — S32411D Displaced fracture of anterior wall of right acetabulum, subsequent encounter for fracture with routine healing: Secondary | ICD-10-CM | POA: Diagnosis not present

## 2022-08-11 DIAGNOSIS — Z9181 History of falling: Secondary | ICD-10-CM | POA: Diagnosis not present

## 2022-08-11 DIAGNOSIS — I872 Venous insufficiency (chronic) (peripheral): Secondary | ICD-10-CM | POA: Diagnosis not present

## 2022-08-11 DIAGNOSIS — L03115 Cellulitis of right lower limb: Secondary | ICD-10-CM | POA: Diagnosis not present

## 2022-08-11 DIAGNOSIS — I890 Lymphedema, not elsewhere classified: Secondary | ICD-10-CM | POA: Diagnosis not present

## 2022-08-11 DIAGNOSIS — I2510 Atherosclerotic heart disease of native coronary artery without angina pectoris: Secondary | ICD-10-CM | POA: Diagnosis not present

## 2022-08-11 DIAGNOSIS — I739 Peripheral vascular disease, unspecified: Secondary | ICD-10-CM | POA: Diagnosis not present

## 2022-08-11 DIAGNOSIS — R296 Repeated falls: Secondary | ICD-10-CM | POA: Diagnosis not present

## 2022-08-11 DIAGNOSIS — L03116 Cellulitis of left lower limb: Secondary | ICD-10-CM | POA: Diagnosis not present

## 2022-08-11 DIAGNOSIS — H3530 Unspecified macular degeneration: Secondary | ICD-10-CM | POA: Diagnosis not present

## 2022-08-11 DIAGNOSIS — E785 Hyperlipidemia, unspecified: Secondary | ICD-10-CM | POA: Diagnosis not present

## 2022-08-11 NOTE — Telephone Encounter (Signed)
Called patient to follow up with how he was doing since his fall. Unable to leave a voice message due to his voicemail being full.

## 2022-08-11 NOTE — Telephone Encounter (Signed)
Copied from [place] [phone]. Topic: General - Other >> Aug 11, 2022  1:47 PM Ja-Kwan M wrote: Reason for CRM: Larna Daughters with [facility] called to report pt informed her that he had a fall yesterday after church. Sharee Pimple stated pt informed her that he was not holding the handrail and fell back on to a church member that was helping him to get in the home. Sharee Pimple also stated pt advised her that he did not hit his head and there wasn't any increase in pain. Cb# [phone].

## 2022-08-12 DIAGNOSIS — S32411D Displaced fracture of anterior wall of right acetabulum, subsequent encounter for fracture with routine healing: Secondary | ICD-10-CM | POA: Diagnosis not present

## 2022-08-12 DIAGNOSIS — Z9181 History of falling: Secondary | ICD-10-CM | POA: Diagnosis not present

## 2022-08-12 DIAGNOSIS — I872 Venous insufficiency (chronic) (peripheral): Secondary | ICD-10-CM | POA: Diagnosis not present

## 2022-08-12 DIAGNOSIS — I10 Essential (primary) hypertension: Secondary | ICD-10-CM | POA: Diagnosis not present

## 2022-08-12 DIAGNOSIS — L03116 Cellulitis of left lower limb: Secondary | ICD-10-CM | POA: Diagnosis not present

## 2022-08-12 DIAGNOSIS — R296 Repeated falls: Secondary | ICD-10-CM | POA: Diagnosis not present

## 2022-08-12 DIAGNOSIS — I739 Peripheral vascular disease, unspecified: Secondary | ICD-10-CM | POA: Diagnosis not present

## 2022-08-12 DIAGNOSIS — I2510 Atherosclerotic heart disease of native coronary artery without angina pectoris: Secondary | ICD-10-CM | POA: Diagnosis not present

## 2022-08-12 DIAGNOSIS — M5441 Lumbago with sciatica, right side: Secondary | ICD-10-CM | POA: Diagnosis not present

## 2022-08-12 DIAGNOSIS — E785 Hyperlipidemia, unspecified: Secondary | ICD-10-CM | POA: Diagnosis not present

## 2022-08-12 DIAGNOSIS — I890 Lymphedema, not elsewhere classified: Secondary | ICD-10-CM | POA: Diagnosis not present

## 2022-08-12 DIAGNOSIS — H3530 Unspecified macular degeneration: Secondary | ICD-10-CM | POA: Diagnosis not present

## 2022-08-12 DIAGNOSIS — L03115 Cellulitis of right lower limb: Secondary | ICD-10-CM | POA: Diagnosis not present

## 2022-08-15 ENCOUNTER — Ambulatory Visit (INDEPENDENT_AMBULATORY_CARE_PROVIDER_SITE_OTHER): Payer: Medicare Other | Admitting: Family Medicine

## 2022-08-15 ENCOUNTER — Encounter: Payer: Self-pay | Admitting: Family Medicine

## 2022-08-15 VITALS — BP 148/68 | HR 57 | Temp 97.3°F | Ht 69.0 in | Wt 157.9 lb

## 2022-08-15 DIAGNOSIS — R609 Edema, unspecified: Secondary | ICD-10-CM | POA: Diagnosis not present

## 2022-08-15 DIAGNOSIS — I25118 Atherosclerotic heart disease of native coronary artery with other forms of angina pectoris: Secondary | ICD-10-CM | POA: Diagnosis not present

## 2022-08-15 DIAGNOSIS — I739 Peripheral vascular disease, unspecified: Secondary | ICD-10-CM | POA: Diagnosis not present

## 2022-08-15 DIAGNOSIS — R600 Localized edema: Secondary | ICD-10-CM

## 2022-08-15 NOTE — Assessment & Plan Note (Signed)
Has not seen vascular in about 3.5 years. Having swelling. Cannot tolerate compression hose. Will get him back into vascular for ?evaluation of lymphatic pumps. Referral generated today.

## 2022-08-15 NOTE — Progress Notes (Signed)
BP (!) 148/68   Pulse (!) 57   Temp (!) 97.3 F (36.3 C) (Oral)   Ht '5\' 9"'$  (1.753 m)   Wt 157 lb 14.4 oz (71.6 kg)   SpO2 97%   BMI 23.32 kg/m    Subjective:    Patient ID: Nicholas Gross, male    DOB: 11-06-1927, 89 y.o.   MRN: [id number]  HPI: Nicholas Gross is a 89 y.o. male  Chief Complaint  Patient presents with   Cellulitis    Patient niece says Monday she did not noticed any redness. She says the open sores have dried out and says today it is redder than it was Monday.    SKIN INFECTION Duration: weeks Location: lower legs History of trauma in area: no Pain: no Quality: N/A Severity: mild Redness: yes Swelling: yes Oozing: no Pus: no Fevers: no Nausea/vomiting: no Status: better Treatments attempted:antibiotics  Tetanus: UTD  Relevant past medical, surgical, family and social history reviewed and updated as indicated. Interim medical history since our last visit reviewed. Allergies and medications reviewed and updated.  Review of Systems  Constitutional: Negative.   Respiratory: Negative.    Cardiovascular:  Positive for leg swelling. Negative for chest pain and palpitations.  Gastrointestinal: Negative.   Musculoskeletal: Negative.   Skin:  Positive for color change. Negative for pallor, rash and wound.  Psychiatric/Behavioral: Negative.      Per HPI unless specifically indicated above     Objective:    BP (!) 148/68   Pulse (!) 57   Temp (!) 97.3 F (36.3 C) (Oral)   Ht '5\' 9"'$  (1.753 m)   Wt 157 lb 14.4 oz (71.6 kg)   SpO2 97%   BMI 23.32 kg/m   Wt Readings from Last 3 Encounters:  08/15/22 157 lb 14.4 oz (71.6 kg)  07/22/22 155 lb 6.4 oz (70.5 kg)  07/02/22 155 lb 9.6 oz (70.6 kg)    Physical Exam Vitals and nursing note reviewed.  Constitutional:      General: He is not in acute distress.    Appearance: Normal appearance. He is normal weight. He is not ill-appearing, toxic-appearing or diaphoretic.  HENT:     Head:  Normocephalic and atraumatic.     Right Ear: External ear normal.     Left Ear: External ear normal.     Nose: Nose normal.     Mouth/Throat:     Mouth: Mucous membranes are moist.     Pharynx: Oropharynx is clear.  Eyes:     General: No scleral icterus.       Right eye: No discharge.        Left eye: No discharge.     Extraocular Movements: Extraocular movements intact.     Conjunctiva/sclera: Conjunctivae normal.     Pupils: Pupils are equal, round, and reactive to light.  Cardiovascular:     Rate and Rhythm: Normal rate and regular rhythm.     Pulses: Normal pulses.     Heart sounds: Murmur heard.     No friction rub. No gallop.  Pulmonary:     Effort: Pulmonary effort is normal. No respiratory distress.     Breath sounds: Normal breath sounds. No stridor. No wheezing, rhonchi or rales.  Chest:     Chest wall: No tenderness.  Musculoskeletal:        General: Normal range of motion.     Cervical back: Normal range of motion and neck supple.     Right lower  leg: Edema (2+) present.     Left lower leg: Edema (2+) present.  Skin:    General: Skin is warm and dry.     Capillary Refill: Capillary refill takes less than 2 seconds.     Coloration: Skin is not jaundiced or pale.     Findings: Erythema present. No bruising, lesion or rash.     Comments: Redness on bilateral ankles, no heat, no pain, + swelling  Neurological:     General: No focal deficit present.     Mental Status: He is alert and oriented to person, place, and time. Mental status is at baseline.  Psychiatric:        Mood and Affect: Mood normal.        Behavior: Behavior normal.        Thought Content: Thought content normal.        Judgment: Judgment normal.     Results for orders placed or performed during the hospital encounter of [id number]  Basic metabolic panel  Result Value Ref Range   Sodium 131 (L) 135 - 145 mmol/L   Potassium 3.9 3.5 - 5.1 mmol/L   Chloride 97 (L) 98 - 111 mmol/L   CO2 25 22 - 32  mmol/L   Glucose, Bld 101 (H) 70 - 99 mg/dL   BUN 25 (H) 8 - 23 mg/dL   Creatinine, Ser 1.09 0.61 - 1.24 mg/dL   Calcium 9.1 8.9 - 10.3 mg/dL   GFR, Estimated >60 >60 mL/min   Anion gap 9 5 - 15  CBC  Result Value Ref Range   WBC 7.5 4.0 - 10.5 K/uL   RBC 3.67 (L) 4.22 - 5.81 MIL/uL   Hemoglobin 11.7 (L) 13.0 - 17.0 g/dL   HCT 34.4 (L) 39.0 - 52.0 %   MCV 93.7 80.0 - 100.0 fL   MCH 31.9 26.0 - 34.0 pg   MCHC 34.0 30.0 - 36.0 g/dL   RDW 11.6 11.5 - 15.5 %   Platelets 224 150 - 400 K/uL   nRBC 0.0 0.0 - 0.2 %  Urinalysis, Routine w reflex microscopic  Result Value Ref Range   Color, Urine YELLOW (A) YELLOW   APPearance CLEAR (A) CLEAR   Specific Gravity, Urine 1.017 1.005 - 1.030   pH 6.0 5.0 - 8.0   Glucose, UA NEGATIVE NEGATIVE mg/dL   Hgb urine dipstick NEGATIVE NEGATIVE   Bilirubin Urine NEGATIVE NEGATIVE   Ketones, ur NEGATIVE NEGATIVE mg/dL   Protein, ur NEGATIVE NEGATIVE mg/dL   Nitrite NEGATIVE NEGATIVE   Leukocytes,Ua NEGATIVE NEGATIVE   RBC / HPF 6-10 0 - 5 RBC/hpf   WBC, UA 0-5 0 - 5 WBC/hpf   Bacteria, UA NONE SEEN NONE SEEN   Squamous Epithelial / HPF NONE SEEN 0 - 5  Procalcitonin - Baseline  Result Value Ref Range   Procalcitonin <0.10 ng/mL  Vitamin B12  Result Value Ref Range   Vitamin B-12 605 180 - 914 pg/mL  CBC with Differential/Platelet  Result Value Ref Range   WBC 6.9 4.0 - 10.5 K/uL   RBC 3.93 (L) 4.22 - 5.81 MIL/uL   Hemoglobin 12.4 (L) 13.0 - 17.0 g/dL   HCT 36.0 (L) 39.0 - 52.0 %   MCV 91.6 80.0 - 100.0 fL   MCH 31.6 26.0 - 34.0 pg   MCHC 34.4 30.0 - 36.0 g/dL   RDW 11.5 11.5 - 15.5 %   Platelets 244 150 - 400 K/uL   nRBC 0.0 0.0 - 0.2 %  Neutrophils Relative % 66 %   Neutro Abs 4.6 1.7 - 7.7 K/uL   Lymphocytes Relative 17 %   Lymphs Abs 1.2 0.7 - 4.0 K/uL   Monocytes Relative 9 %   Monocytes Absolute 0.7 0.1 - 1.0 K/uL   Eosinophils Relative 6 %   Eosinophils Absolute 0.4 0.0 - 0.5 K/uL   Basophils Relative 1 %   Basophils  Absolute 0.1 0.0 - 0.1 K/uL   Immature Granulocytes 1 %   Abs Immature Granulocytes 0.04 0.00 - 0.07 K/uL  Basic metabolic panel  Result Value Ref Range   Sodium 134 (L) 135 - 145 mmol/L   Potassium 4.0 3.5 - 5.1 mmol/L   Chloride 101 98 - 111 mmol/L   CO2 23 22 - 32 mmol/L   Glucose, Bld 99 70 - 99 mg/dL   BUN 16 8 - 23 mg/dL   Creatinine, Ser 0.61 0.61 - 1.24 mg/dL   Calcium 9.0 8.9 - 10.3 mg/dL   GFR, Estimated >60 >60 mL/min   Anion gap 10 5 - 15  Troponin I (High Sensitivity)  Result Value Ref Range   Troponin I (High Sensitivity) 10 <18 ng/L      Assessment & Plan:   Problem List Items Addressed This Visit       Cardiovascular and Mediastinum   CAD (coronary artery disease)    Has not seen his cardiologist in over a year- appointment made for patient today. Continue to monitor.       PAD (peripheral artery disease) ([facility])    Has not seen vascular in about 3.5 years. Having swelling. Cannot tolerate compression hose. Will get him back into vascular for ?evaluation of lymphatic pumps. Referral generated today.      Relevant Orders   Ambulatory referral to Vascular Surgery   Other Visit Diagnoses     Peripheral edema    -  Primary   No sign of infection at this time. Color change likely due to swelling. Cannot tolerate compression hose. Will get him into vascular for further evaluation.   Relevant Orders   Ambulatory referral to Vascular Surgery        Follow up plan: Return in about 3 months (around 11/13/2022).

## 2022-08-15 NOTE — Assessment & Plan Note (Addendum)
Has not seen his cardiologist in over a year- appointment made for patient today. Continue to monitor.

## 2022-08-19 DIAGNOSIS — I872 Venous insufficiency (chronic) (peripheral): Secondary | ICD-10-CM | POA: Diagnosis not present

## 2022-08-19 DIAGNOSIS — I739 Peripheral vascular disease, unspecified: Secondary | ICD-10-CM | POA: Diagnosis not present

## 2022-08-19 DIAGNOSIS — S32411D Displaced fracture of anterior wall of right acetabulum, subsequent encounter for fracture with routine healing: Secondary | ICD-10-CM | POA: Diagnosis not present

## 2022-08-19 DIAGNOSIS — E785 Hyperlipidemia, unspecified: Secondary | ICD-10-CM | POA: Diagnosis not present

## 2022-08-19 DIAGNOSIS — R296 Repeated falls: Secondary | ICD-10-CM | POA: Diagnosis not present

## 2022-08-19 DIAGNOSIS — Z9181 History of falling: Secondary | ICD-10-CM | POA: Diagnosis not present

## 2022-08-19 DIAGNOSIS — M5441 Lumbago with sciatica, right side: Secondary | ICD-10-CM | POA: Diagnosis not present

## 2022-08-19 DIAGNOSIS — I2510 Atherosclerotic heart disease of native coronary artery without angina pectoris: Secondary | ICD-10-CM | POA: Diagnosis not present

## 2022-08-19 DIAGNOSIS — L03115 Cellulitis of right lower limb: Secondary | ICD-10-CM | POA: Diagnosis not present

## 2022-08-19 DIAGNOSIS — I890 Lymphedema, not elsewhere classified: Secondary | ICD-10-CM | POA: Diagnosis not present

## 2022-08-19 DIAGNOSIS — I10 Essential (primary) hypertension: Secondary | ICD-10-CM | POA: Diagnosis not present

## 2022-08-19 DIAGNOSIS — L03116 Cellulitis of left lower limb: Secondary | ICD-10-CM | POA: Diagnosis not present

## 2022-08-19 DIAGNOSIS — H3530 Unspecified macular degeneration: Secondary | ICD-10-CM | POA: Diagnosis not present

## 2022-08-22 ENCOUNTER — Telehealth: Payer: Self-pay | Admitting: Family Medicine

## 2022-08-22 DIAGNOSIS — E785 Hyperlipidemia, unspecified: Secondary | ICD-10-CM | POA: Diagnosis not present

## 2022-08-22 DIAGNOSIS — I890 Lymphedema, not elsewhere classified: Secondary | ICD-10-CM | POA: Diagnosis not present

## 2022-08-22 DIAGNOSIS — S40811D Abrasion of right upper arm, subsequent encounter: Secondary | ICD-10-CM | POA: Diagnosis not present

## 2022-08-22 DIAGNOSIS — Z9181 History of falling: Secondary | ICD-10-CM | POA: Diagnosis not present

## 2022-08-22 DIAGNOSIS — I872 Venous insufficiency (chronic) (peripheral): Secondary | ICD-10-CM | POA: Diagnosis not present

## 2022-08-22 DIAGNOSIS — S32411D Displaced fracture of anterior wall of right acetabulum, subsequent encounter for fracture with routine healing: Secondary | ICD-10-CM | POA: Diagnosis not present

## 2022-08-22 DIAGNOSIS — I10 Essential (primary) hypertension: Secondary | ICD-10-CM | POA: Diagnosis not present

## 2022-08-22 DIAGNOSIS — I739 Peripheral vascular disease, unspecified: Secondary | ICD-10-CM | POA: Diagnosis not present

## 2022-08-22 DIAGNOSIS — R296 Repeated falls: Secondary | ICD-10-CM | POA: Diagnosis not present

## 2022-08-22 DIAGNOSIS — H3530 Unspecified macular degeneration: Secondary | ICD-10-CM | POA: Diagnosis not present

## 2022-08-22 DIAGNOSIS — I2510 Atherosclerotic heart disease of native coronary artery without angina pectoris: Secondary | ICD-10-CM | POA: Diagnosis not present

## 2022-08-22 DIAGNOSIS — M5441 Lumbago with sciatica, right side: Secondary | ICD-10-CM | POA: Diagnosis not present

## 2022-08-22 NOTE — Telephone Encounter (Signed)
Ok for verbal orders ?

## 2022-08-22 NOTE — Telephone Encounter (Signed)
Nicholas Gross with Nicholas Gross states that the pt fell today and has a skin tear on the right forearm. She is needing a verbal order for a Education officer, museum, Kerlix dressing, and o-forum.   Please advise.

## 2022-08-22 NOTE — Telephone Encounter (Signed)
Spoke with Izora Gala from [facility] and provided the verbal OK for patient orders. Izora Gala verbalized understanding.

## 2022-08-25 DIAGNOSIS — H3530 Unspecified macular degeneration: Secondary | ICD-10-CM | POA: Diagnosis not present

## 2022-08-25 DIAGNOSIS — R296 Repeated falls: Secondary | ICD-10-CM | POA: Diagnosis not present

## 2022-08-25 DIAGNOSIS — I872 Venous insufficiency (chronic) (peripheral): Secondary | ICD-10-CM | POA: Diagnosis not present

## 2022-08-25 DIAGNOSIS — S40811D Abrasion of right upper arm, subsequent encounter: Secondary | ICD-10-CM | POA: Diagnosis not present

## 2022-08-25 DIAGNOSIS — M5441 Lumbago with sciatica, right side: Secondary | ICD-10-CM | POA: Diagnosis not present

## 2022-08-25 DIAGNOSIS — I2510 Atherosclerotic heart disease of native coronary artery without angina pectoris: Secondary | ICD-10-CM | POA: Diagnosis not present

## 2022-08-25 DIAGNOSIS — S41101A Unspecified open wound of right upper arm, initial encounter: Secondary | ICD-10-CM | POA: Diagnosis not present

## 2022-08-25 DIAGNOSIS — I10 Essential (primary) hypertension: Secondary | ICD-10-CM | POA: Diagnosis not present

## 2022-08-25 DIAGNOSIS — I890 Lymphedema, not elsewhere classified: Secondary | ICD-10-CM | POA: Diagnosis not present

## 2022-08-25 DIAGNOSIS — I739 Peripheral vascular disease, unspecified: Secondary | ICD-10-CM | POA: Diagnosis not present

## 2022-08-25 DIAGNOSIS — E785 Hyperlipidemia, unspecified: Secondary | ICD-10-CM | POA: Diagnosis not present

## 2022-08-25 DIAGNOSIS — Z9181 History of falling: Secondary | ICD-10-CM | POA: Diagnosis not present

## 2022-08-25 DIAGNOSIS — S32411D Displaced fracture of anterior wall of right acetabulum, subsequent encounter for fracture with routine healing: Secondary | ICD-10-CM | POA: Diagnosis not present

## 2022-08-26 DIAGNOSIS — S40811D Abrasion of right upper arm, subsequent encounter: Secondary | ICD-10-CM | POA: Diagnosis not present

## 2022-08-26 DIAGNOSIS — I890 Lymphedema, not elsewhere classified: Secondary | ICD-10-CM | POA: Diagnosis not present

## 2022-08-26 DIAGNOSIS — E785 Hyperlipidemia, unspecified: Secondary | ICD-10-CM | POA: Diagnosis not present

## 2022-08-26 DIAGNOSIS — Z9181 History of falling: Secondary | ICD-10-CM | POA: Diagnosis not present

## 2022-08-26 DIAGNOSIS — I739 Peripheral vascular disease, unspecified: Secondary | ICD-10-CM | POA: Diagnosis not present

## 2022-08-26 DIAGNOSIS — I2510 Atherosclerotic heart disease of native coronary artery without angina pectoris: Secondary | ICD-10-CM | POA: Diagnosis not present

## 2022-08-26 DIAGNOSIS — S32411D Displaced fracture of anterior wall of right acetabulum, subsequent encounter for fracture with routine healing: Secondary | ICD-10-CM | POA: Diagnosis not present

## 2022-08-26 DIAGNOSIS — R296 Repeated falls: Secondary | ICD-10-CM | POA: Diagnosis not present

## 2022-08-26 DIAGNOSIS — I872 Venous insufficiency (chronic) (peripheral): Secondary | ICD-10-CM | POA: Diagnosis not present

## 2022-08-26 DIAGNOSIS — H3530 Unspecified macular degeneration: Secondary | ICD-10-CM | POA: Diagnosis not present

## 2022-08-26 DIAGNOSIS — M5441 Lumbago with sciatica, right side: Secondary | ICD-10-CM | POA: Diagnosis not present

## 2022-08-26 DIAGNOSIS — I10 Essential (primary) hypertension: Secondary | ICD-10-CM | POA: Diagnosis not present

## 2022-08-28 DIAGNOSIS — I10 Essential (primary) hypertension: Secondary | ICD-10-CM | POA: Diagnosis not present

## 2022-08-28 DIAGNOSIS — R296 Repeated falls: Secondary | ICD-10-CM | POA: Diagnosis not present

## 2022-08-28 DIAGNOSIS — I739 Peripheral vascular disease, unspecified: Secondary | ICD-10-CM | POA: Diagnosis not present

## 2022-08-28 DIAGNOSIS — H3530 Unspecified macular degeneration: Secondary | ICD-10-CM | POA: Diagnosis not present

## 2022-08-28 DIAGNOSIS — I890 Lymphedema, not elsewhere classified: Secondary | ICD-10-CM | POA: Diagnosis not present

## 2022-08-28 DIAGNOSIS — E785 Hyperlipidemia, unspecified: Secondary | ICD-10-CM | POA: Diagnosis not present

## 2022-08-28 DIAGNOSIS — I2510 Atherosclerotic heart disease of native coronary artery without angina pectoris: Secondary | ICD-10-CM | POA: Diagnosis not present

## 2022-08-28 DIAGNOSIS — I872 Venous insufficiency (chronic) (peripheral): Secondary | ICD-10-CM | POA: Diagnosis not present

## 2022-08-28 DIAGNOSIS — S32411D Displaced fracture of anterior wall of right acetabulum, subsequent encounter for fracture with routine healing: Secondary | ICD-10-CM | POA: Diagnosis not present

## 2022-08-28 DIAGNOSIS — Z9181 History of falling: Secondary | ICD-10-CM | POA: Diagnosis not present

## 2022-08-28 DIAGNOSIS — M5441 Lumbago with sciatica, right side: Secondary | ICD-10-CM | POA: Diagnosis not present

## 2022-08-28 DIAGNOSIS — S40811D Abrasion of right upper arm, subsequent encounter: Secondary | ICD-10-CM | POA: Diagnosis not present

## 2022-08-29 DIAGNOSIS — S32411D Displaced fracture of anterior wall of right acetabulum, subsequent encounter for fracture with routine healing: Secondary | ICD-10-CM | POA: Diagnosis not present

## 2022-08-29 DIAGNOSIS — I890 Lymphedema, not elsewhere classified: Secondary | ICD-10-CM | POA: Diagnosis not present

## 2022-08-29 DIAGNOSIS — I2510 Atherosclerotic heart disease of native coronary artery without angina pectoris: Secondary | ICD-10-CM | POA: Diagnosis not present

## 2022-08-29 DIAGNOSIS — E785 Hyperlipidemia, unspecified: Secondary | ICD-10-CM | POA: Diagnosis not present

## 2022-08-29 DIAGNOSIS — R296 Repeated falls: Secondary | ICD-10-CM | POA: Diagnosis not present

## 2022-08-29 DIAGNOSIS — M5441 Lumbago with sciatica, right side: Secondary | ICD-10-CM | POA: Diagnosis not present

## 2022-08-29 DIAGNOSIS — I739 Peripheral vascular disease, unspecified: Secondary | ICD-10-CM | POA: Diagnosis not present

## 2022-08-29 DIAGNOSIS — H3530 Unspecified macular degeneration: Secondary | ICD-10-CM | POA: Diagnosis not present

## 2022-08-29 DIAGNOSIS — I10 Essential (primary) hypertension: Secondary | ICD-10-CM | POA: Diagnosis not present

## 2022-08-29 DIAGNOSIS — I872 Venous insufficiency (chronic) (peripheral): Secondary | ICD-10-CM | POA: Diagnosis not present

## 2022-08-29 DIAGNOSIS — S40811D Abrasion of right upper arm, subsequent encounter: Secondary | ICD-10-CM | POA: Diagnosis not present

## 2022-08-29 DIAGNOSIS — Z9181 History of falling: Secondary | ICD-10-CM | POA: Diagnosis not present

## 2022-09-01 DIAGNOSIS — H3530 Unspecified macular degeneration: Secondary | ICD-10-CM | POA: Diagnosis not present

## 2022-09-01 DIAGNOSIS — I872 Venous insufficiency (chronic) (peripheral): Secondary | ICD-10-CM | POA: Diagnosis not present

## 2022-09-01 DIAGNOSIS — E785 Hyperlipidemia, unspecified: Secondary | ICD-10-CM | POA: Diagnosis not present

## 2022-09-01 DIAGNOSIS — I2510 Atherosclerotic heart disease of native coronary artery without angina pectoris: Secondary | ICD-10-CM | POA: Diagnosis not present

## 2022-09-01 DIAGNOSIS — M5441 Lumbago with sciatica, right side: Secondary | ICD-10-CM | POA: Diagnosis not present

## 2022-09-01 DIAGNOSIS — I739 Peripheral vascular disease, unspecified: Secondary | ICD-10-CM | POA: Diagnosis not present

## 2022-09-01 DIAGNOSIS — S40811D Abrasion of right upper arm, subsequent encounter: Secondary | ICD-10-CM | POA: Diagnosis not present

## 2022-09-01 DIAGNOSIS — R296 Repeated falls: Secondary | ICD-10-CM | POA: Diagnosis not present

## 2022-09-01 DIAGNOSIS — Z9181 History of falling: Secondary | ICD-10-CM | POA: Diagnosis not present

## 2022-09-01 DIAGNOSIS — I890 Lymphedema, not elsewhere classified: Secondary | ICD-10-CM | POA: Diagnosis not present

## 2022-09-01 DIAGNOSIS — S32411D Displaced fracture of anterior wall of right acetabulum, subsequent encounter for fracture with routine healing: Secondary | ICD-10-CM | POA: Diagnosis not present

## 2022-09-01 DIAGNOSIS — I10 Essential (primary) hypertension: Secondary | ICD-10-CM | POA: Diagnosis not present

## 2022-09-02 DIAGNOSIS — H3530 Unspecified macular degeneration: Secondary | ICD-10-CM | POA: Diagnosis not present

## 2022-09-02 DIAGNOSIS — Z9181 History of falling: Secondary | ICD-10-CM | POA: Diagnosis not present

## 2022-09-02 DIAGNOSIS — S32411D Displaced fracture of anterior wall of right acetabulum, subsequent encounter for fracture with routine healing: Secondary | ICD-10-CM | POA: Diagnosis not present

## 2022-09-02 DIAGNOSIS — S40811D Abrasion of right upper arm, subsequent encounter: Secondary | ICD-10-CM | POA: Diagnosis not present

## 2022-09-02 DIAGNOSIS — R296 Repeated falls: Secondary | ICD-10-CM | POA: Diagnosis not present

## 2022-09-02 DIAGNOSIS — I872 Venous insufficiency (chronic) (peripheral): Secondary | ICD-10-CM | POA: Diagnosis not present

## 2022-09-02 DIAGNOSIS — I2510 Atherosclerotic heart disease of native coronary artery without angina pectoris: Secondary | ICD-10-CM | POA: Diagnosis not present

## 2022-09-02 DIAGNOSIS — I890 Lymphedema, not elsewhere classified: Secondary | ICD-10-CM | POA: Diagnosis not present

## 2022-09-02 DIAGNOSIS — M5441 Lumbago with sciatica, right side: Secondary | ICD-10-CM | POA: Diagnosis not present

## 2022-09-02 DIAGNOSIS — I10 Essential (primary) hypertension: Secondary | ICD-10-CM | POA: Diagnosis not present

## 2022-09-02 DIAGNOSIS — E785 Hyperlipidemia, unspecified: Secondary | ICD-10-CM | POA: Diagnosis not present

## 2022-09-02 DIAGNOSIS — I739 Peripheral vascular disease, unspecified: Secondary | ICD-10-CM | POA: Diagnosis not present

## 2022-09-04 ENCOUNTER — Telehealth: Payer: Self-pay | Admitting: Family Medicine

## 2022-09-04 DIAGNOSIS — I890 Lymphedema, not elsewhere classified: Secondary | ICD-10-CM | POA: Diagnosis not present

## 2022-09-04 DIAGNOSIS — I10 Essential (primary) hypertension: Secondary | ICD-10-CM | POA: Diagnosis not present

## 2022-09-04 DIAGNOSIS — R296 Repeated falls: Secondary | ICD-10-CM | POA: Diagnosis not present

## 2022-09-04 DIAGNOSIS — I872 Venous insufficiency (chronic) (peripheral): Secondary | ICD-10-CM | POA: Diagnosis not present

## 2022-09-04 DIAGNOSIS — I2510 Atherosclerotic heart disease of native coronary artery without angina pectoris: Secondary | ICD-10-CM | POA: Diagnosis not present

## 2022-09-04 DIAGNOSIS — S32411D Displaced fracture of anterior wall of right acetabulum, subsequent encounter for fracture with routine healing: Secondary | ICD-10-CM | POA: Diagnosis not present

## 2022-09-04 DIAGNOSIS — E785 Hyperlipidemia, unspecified: Secondary | ICD-10-CM | POA: Diagnosis not present

## 2022-09-04 DIAGNOSIS — H3530 Unspecified macular degeneration: Secondary | ICD-10-CM | POA: Diagnosis not present

## 2022-09-04 DIAGNOSIS — S40811D Abrasion of right upper arm, subsequent encounter: Secondary | ICD-10-CM | POA: Diagnosis not present

## 2022-09-04 DIAGNOSIS — Z9181 History of falling: Secondary | ICD-10-CM | POA: Diagnosis not present

## 2022-09-04 DIAGNOSIS — I739 Peripheral vascular disease, unspecified: Secondary | ICD-10-CM | POA: Diagnosis not present

## 2022-09-04 DIAGNOSIS — M5441 Lumbago with sciatica, right side: Secondary | ICD-10-CM | POA: Diagnosis not present

## 2022-09-04 NOTE — Telephone Encounter (Signed)
Copied from [place] [id number]. Topic: Medicare AWV >> Sep 04, 2022 11:37 AM Devoria Glassing wrote: Reason for CRM: Called patient to schedule Medicare Annual Wellness Visit (AWV). Left message for patient to call back and schedule Medicare Annual Wellness Visit (AWV).  Last date of AWV: 05/13/2021  Please schedule an appointment at any time with Kirke Shaggy, LPN .  If any questions, please contact me.  Thank you ,  Sherol Dade; [place] Direct Dial: [phone]

## 2022-09-08 ENCOUNTER — Ambulatory Visit: Payer: Self-pay | Admitting: *Deleted

## 2022-09-08 DIAGNOSIS — I872 Venous insufficiency (chronic) (peripheral): Secondary | ICD-10-CM | POA: Diagnosis not present

## 2022-09-08 DIAGNOSIS — I739 Peripheral vascular disease, unspecified: Secondary | ICD-10-CM | POA: Diagnosis not present

## 2022-09-08 DIAGNOSIS — S40811D Abrasion of right upper arm, subsequent encounter: Secondary | ICD-10-CM | POA: Diagnosis not present

## 2022-09-08 DIAGNOSIS — I10 Essential (primary) hypertension: Secondary | ICD-10-CM | POA: Diagnosis not present

## 2022-09-08 DIAGNOSIS — G250 Essential tremor: Secondary | ICD-10-CM | POA: Diagnosis not present

## 2022-09-08 DIAGNOSIS — R2689 Other abnormalities of gait and mobility: Secondary | ICD-10-CM | POA: Diagnosis not present

## 2022-09-08 DIAGNOSIS — Z9181 History of falling: Secondary | ICD-10-CM | POA: Diagnosis not present

## 2022-09-08 DIAGNOSIS — S32411D Displaced fracture of anterior wall of right acetabulum, subsequent encounter for fracture with routine healing: Secondary | ICD-10-CM | POA: Diagnosis not present

## 2022-09-08 DIAGNOSIS — I2510 Atherosclerotic heart disease of native coronary artery without angina pectoris: Secondary | ICD-10-CM | POA: Diagnosis not present

## 2022-09-08 DIAGNOSIS — M62838 Other muscle spasm: Secondary | ICD-10-CM | POA: Diagnosis not present

## 2022-09-08 DIAGNOSIS — E785 Hyperlipidemia, unspecified: Secondary | ICD-10-CM | POA: Diagnosis not present

## 2022-09-08 DIAGNOSIS — I890 Lymphedema, not elsewhere classified: Secondary | ICD-10-CM | POA: Diagnosis not present

## 2022-09-08 DIAGNOSIS — R296 Repeated falls: Secondary | ICD-10-CM | POA: Diagnosis not present

## 2022-09-08 DIAGNOSIS — M5441 Lumbago with sciatica, right side: Secondary | ICD-10-CM | POA: Diagnosis not present

## 2022-09-08 DIAGNOSIS — H3530 Unspecified macular degeneration: Secondary | ICD-10-CM | POA: Diagnosis not present

## 2022-09-08 NOTE — Telephone Encounter (Signed)
  Chief Complaint: Reporting Fall Symptoms: None Frequency: Thursday, 2 falls, one reported. [facility] RN unaware of second fall. Pertinent Negatives: Patient denies Pain, bruising, lacerations. States did not hit head Disposition: [] ED /[] Urgent Care (no appt availability in office) / [] Appointment(In office/virtual)/ []  [facility]/ [] Home Care/ [] Refused Recommended Disposition /[] [place] [facility]/ [x]  Follow-up with PCP Additional Notes:  Fall did not happen today. Sharee Pimple, PTA with [facility] states Nicholas Gala, RN faxed report that pt had fallen Thursday 09/04/22. Sharee Pimple states pt is now reporting he fell another time that same day, "Putting on his shirt, slide down wall, didn't hit head, no injuries." States falls from LOB and does not use walker.  Assured NT would route additional information to practice for provider's review.  After hours call. Reason for Disposition  [1] Caller has NON-URGENT question AND [2] triager unable to answer question  Answer Assessment - Initial Assessment Questions 1. MECHANISM: "How did the fall happen?"     "Slide down wall, putting on shirt" 2. DOMESTIC VIOLENCE AND ELDER ABUSE SCREENING: "Did you fall because someone pushed you or tried to hurt you?" If Yes, ask: "Are you safe now?"      3. ONSET: "When did the fall happen?" (e.g., minutes, hours, or days ago)     Thursday, second fall of day. 4. LOCATION: "What part of the body hit the ground?" (e.g., back, buttocks, head, hips, knees, hands, head, stomach)     None 5. INJURY: "Did you hurt (injure) yourself when you fell?" If Yes, ask: "What did you injure? Tell me more about this?" (e.g., body area; type of injury; pain severity)"     Denies 6. PAIN: "Is there any pain?" If Yes, ask: "How bad is the pain?" (e.g., Scale 1-10; or mild,  moderate, severe)   - NONE (0): No pain   - MILD (1-3): Doesn't interfere with normal activities    - MODERATE (4-7): Interferes with normal activities or awakens  from sleep    - SEVERE (8-10): Excruciating pain, unable to do any normal activities      None 7. SIZE: For cuts, bruises, or swelling, ask: "How large is it?" (e.g., inches or centimeters)      NA  9. OTHER SYMPTOMS: "Do you have any other symptoms?" (e.g., dizziness, fever, weakness; new onset or worsening).      No 10. CAUSE: "What do you think caused the fall (or falling)?" (e.g., tripped, dizzy spell)       LOB  Protocols used: Falls and [facility]

## 2022-09-09 DIAGNOSIS — M5441 Lumbago with sciatica, right side: Secondary | ICD-10-CM | POA: Diagnosis not present

## 2022-09-09 DIAGNOSIS — S40811D Abrasion of right upper arm, subsequent encounter: Secondary | ICD-10-CM | POA: Diagnosis not present

## 2022-09-09 DIAGNOSIS — R296 Repeated falls: Secondary | ICD-10-CM | POA: Diagnosis not present

## 2022-09-09 DIAGNOSIS — H3530 Unspecified macular degeneration: Secondary | ICD-10-CM | POA: Diagnosis not present

## 2022-09-09 DIAGNOSIS — I739 Peripheral vascular disease, unspecified: Secondary | ICD-10-CM | POA: Diagnosis not present

## 2022-09-09 DIAGNOSIS — S32411D Displaced fracture of anterior wall of right acetabulum, subsequent encounter for fracture with routine healing: Secondary | ICD-10-CM | POA: Diagnosis not present

## 2022-09-09 DIAGNOSIS — I872 Venous insufficiency (chronic) (peripheral): Secondary | ICD-10-CM | POA: Diagnosis not present

## 2022-09-09 DIAGNOSIS — E785 Hyperlipidemia, unspecified: Secondary | ICD-10-CM | POA: Diagnosis not present

## 2022-09-09 DIAGNOSIS — I890 Lymphedema, not elsewhere classified: Secondary | ICD-10-CM | POA: Diagnosis not present

## 2022-09-09 DIAGNOSIS — I10 Essential (primary) hypertension: Secondary | ICD-10-CM | POA: Diagnosis not present

## 2022-09-09 DIAGNOSIS — Z9181 History of falling: Secondary | ICD-10-CM | POA: Diagnosis not present

## 2022-09-09 DIAGNOSIS — I2510 Atherosclerotic heart disease of native coronary artery without angina pectoris: Secondary | ICD-10-CM | POA: Diagnosis not present

## 2022-09-10 DIAGNOSIS — S41101A Unspecified open wound of right upper arm, initial encounter: Secondary | ICD-10-CM | POA: Diagnosis not present

## 2022-09-12 DIAGNOSIS — S40811D Abrasion of right upper arm, subsequent encounter: Secondary | ICD-10-CM | POA: Diagnosis not present

## 2022-09-12 DIAGNOSIS — R296 Repeated falls: Secondary | ICD-10-CM | POA: Diagnosis not present

## 2022-09-12 DIAGNOSIS — S32411D Displaced fracture of anterior wall of right acetabulum, subsequent encounter for fracture with routine healing: Secondary | ICD-10-CM | POA: Diagnosis not present

## 2022-09-12 DIAGNOSIS — I739 Peripheral vascular disease, unspecified: Secondary | ICD-10-CM | POA: Diagnosis not present

## 2022-09-12 DIAGNOSIS — I2510 Atherosclerotic heart disease of native coronary artery without angina pectoris: Secondary | ICD-10-CM | POA: Diagnosis not present

## 2022-09-12 DIAGNOSIS — Z9181 History of falling: Secondary | ICD-10-CM | POA: Diagnosis not present

## 2022-09-12 DIAGNOSIS — E785 Hyperlipidemia, unspecified: Secondary | ICD-10-CM | POA: Diagnosis not present

## 2022-09-12 DIAGNOSIS — M5441 Lumbago with sciatica, right side: Secondary | ICD-10-CM | POA: Diagnosis not present

## 2022-09-12 DIAGNOSIS — I872 Venous insufficiency (chronic) (peripheral): Secondary | ICD-10-CM | POA: Diagnosis not present

## 2022-09-12 DIAGNOSIS — I10 Essential (primary) hypertension: Secondary | ICD-10-CM | POA: Diagnosis not present

## 2022-09-12 DIAGNOSIS — H3530 Unspecified macular degeneration: Secondary | ICD-10-CM | POA: Diagnosis not present

## 2022-09-12 DIAGNOSIS — I890 Lymphedema, not elsewhere classified: Secondary | ICD-10-CM | POA: Diagnosis not present

## 2022-09-15 ENCOUNTER — Other Ambulatory Visit: Payer: Self-pay | Admitting: Family Medicine

## 2022-09-15 ENCOUNTER — Other Ambulatory Visit (INDEPENDENT_AMBULATORY_CARE_PROVIDER_SITE_OTHER): Payer: Self-pay | Admitting: Vascular Surgery

## 2022-09-15 ENCOUNTER — Other Ambulatory Visit (INDEPENDENT_AMBULATORY_CARE_PROVIDER_SITE_OTHER): Payer: Medicare Other

## 2022-09-15 ENCOUNTER — Ambulatory Visit (INDEPENDENT_AMBULATORY_CARE_PROVIDER_SITE_OTHER): Payer: Medicare Other | Admitting: Vascular Surgery

## 2022-09-15 ENCOUNTER — Encounter (INDEPENDENT_AMBULATORY_CARE_PROVIDER_SITE_OTHER): Payer: Self-pay | Admitting: Vascular Surgery

## 2022-09-15 VITALS — BP 169/71 | HR 58 | Resp 18 | Ht 69.0 in | Wt 160.8 lb

## 2022-09-15 DIAGNOSIS — M79669 Pain in unspecified lower leg: Secondary | ICD-10-CM

## 2022-09-15 DIAGNOSIS — E782 Mixed hyperlipidemia: Secondary | ICD-10-CM | POA: Diagnosis not present

## 2022-09-15 DIAGNOSIS — M7989 Other specified soft tissue disorders: Secondary | ICD-10-CM | POA: Diagnosis not present

## 2022-09-15 DIAGNOSIS — I10 Essential (primary) hypertension: Secondary | ICD-10-CM | POA: Diagnosis not present

## 2022-09-15 DIAGNOSIS — I2510 Atherosclerotic heart disease of native coronary artery without angina pectoris: Secondary | ICD-10-CM | POA: Diagnosis not present

## 2022-09-15 DIAGNOSIS — I890 Lymphedema, not elsewhere classified: Secondary | ICD-10-CM | POA: Diagnosis not present

## 2022-09-15 DIAGNOSIS — I872 Venous insufficiency (chronic) (peripheral): Secondary | ICD-10-CM | POA: Diagnosis not present

## 2022-09-15 DIAGNOSIS — E785 Hyperlipidemia, unspecified: Secondary | ICD-10-CM | POA: Diagnosis not present

## 2022-09-15 DIAGNOSIS — S40811D Abrasion of right upper arm, subsequent encounter: Secondary | ICD-10-CM | POA: Diagnosis not present

## 2022-09-15 DIAGNOSIS — M5441 Lumbago with sciatica, right side: Secondary | ICD-10-CM | POA: Diagnosis not present

## 2022-09-15 DIAGNOSIS — R600 Localized edema: Secondary | ICD-10-CM

## 2022-09-15 DIAGNOSIS — S32411D Displaced fracture of anterior wall of right acetabulum, subsequent encounter for fracture with routine healing: Secondary | ICD-10-CM | POA: Diagnosis not present

## 2022-09-15 DIAGNOSIS — I739 Peripheral vascular disease, unspecified: Secondary | ICD-10-CM | POA: Diagnosis not present

## 2022-09-15 DIAGNOSIS — I25118 Atherosclerotic heart disease of native coronary artery with other forms of angina pectoris: Secondary | ICD-10-CM | POA: Diagnosis not present

## 2022-09-15 DIAGNOSIS — H3530 Unspecified macular degeneration: Secondary | ICD-10-CM | POA: Diagnosis not present

## 2022-09-15 DIAGNOSIS — R296 Repeated falls: Secondary | ICD-10-CM | POA: Diagnosis not present

## 2022-09-15 DIAGNOSIS — Z9181 History of falling: Secondary | ICD-10-CM | POA: Diagnosis not present

## 2022-09-15 NOTE — Progress Notes (Signed)
MRN : [id number]  Nicholas Gross is a 89 y.o. (05-Nov-1927) male who presents with chief complaint of legs swell.  History of Present Illness:   Patient is seen for evaluation of leg pain and leg swelling. The patient first noticed the swelling remotely. The swelling is associated with pain and discoloration. The pain and swelling worsens with prolonged dependency and improves with elevation. The pain is unrelated to activity.  The patient notes that in the morning the legs are significantly improved but they steadily worsened throughout the course of the day. The patient also notes a steady worsening of the discoloration in the ankle and shin area.   The patient denies claudication symptoms.  The patient denies symptoms consistent with rest pain.  The patient denies and extensive history of DJD and LS spine disease.  The patient has no had any past angiography, interventions or vascular surgery.  Elevation makes the leg symptoms better, dependency makes them much worse. There is no history of ulcerations. The patient denies any recent changes in medications.  The patient has not been wearing graduated compression.  The patient denies a history of DVT or PE. There is no prior history of phlebitis. There is no history of primary lymphedema.  No history of malignancies. No history of trauma or groin or pelvic surgery. There is no history of radiation treatment to the groin or pelvis  The patient denies amaurosis fugax or recent TIA symptoms. There are no recent neurological changes noted. The patient denies recent episodes of angina or shortness of breath    Venous Duplex is negative for DVT or reflux  Current Meds  Medication Sig   cyanocobalamin (VITAMIN B12) 1000 MCG tablet Take 1,000 mcg by mouth daily.   doxycycline (VIBRA-TABS) 100 MG tablet Take 1 tablet (100 mg total) by mouth 2 (two) times daily.   gabapentin (NEURONTIN) 100 MG capsule 100 capsule AS NEEDED (route:  oral)   hydrochlorothiazide (MICROZIDE) 12.5 MG capsule Take 1 capsule (12.5 mg total) by mouth daily.   hydrOXYzine (VISTARIL) 25 MG capsule Take 1 capsule (25 mg total) by mouth every 8 (eight) hours as needed. For itching   lisinopril (ZESTRIL) 5 MG tablet Take 1 tablet (5 mg total) by mouth daily.   Multiple Vitamins-Minerals (PRESERVISION AREDS PO) Take 1 tablet by mouth 2 (two) times daily.   rosuvastatin (CRESTOR) 10 MG tablet TAKE 1 TABLET BY MOUTH ONCE DAILY    Past Medical History:  Diagnosis Date   CAD (coronary artery disease)    History of GI bleed 2008   History of tobacco use    17 pack years, quit around 1970   Hyperlipidemia    Hypertension    Overweight     Past Surgical History:  Procedure Laterality Date   CAROTID ENDARTERECTOMY Left 2010   CATARACT EXTRACTION     cypher stent  09/12/02   s/p cypher stent mid-LAD   TENDON REPAIR  1991   finger and arm    Social History Social History   Tobacco Use   Smoking status: Former    Packs/day: 1.00    Years: 17.00    Additional pack years: 0.00    Total pack years: 17.00    Types: Cigarettes    Quit date: 06/23/1968    Years since quitting: 54.2   Smokeless tobacco: Never  Vaping Use   Vaping Use: Never used  Substance Use Topics   Alcohol use: No   Drug use: No  Family History Family History  Problem Relation Age of Onset   Heart disease Father        possibly   Cancer Sister        breast   AAA (abdominal aortic aneurysm) Brother    Cancer Sister        lung    Allergies  Allergen Reactions   Other     Patient states that he is allergic to something that they place in the IV before they work on you   Simvastatin Other (See Comments)    Myalgia      REVIEW OF SYSTEMS (Negative unless checked)  Constitutional: [] Weight loss  [] Fever  [] Chills Cardiac: [] Chest pain   [] Chest pressure   [] Palpitations   [] Shortness of breath when laying flat   [] Shortness of breath with  exertion. Vascular:  [] Pain in legs with walking   [x] Pain in legs with standing  [] History of DVT   [] Phlebitis   [x] Swelling in legs   [] Varicose veins   [] Non-healing ulcers Pulmonary:   [] Uses home oxygen   [] Productive cough   [] Hemoptysis   [] Wheeze  [] COPD   [] Asthma Neurologic:  [] Dizziness   [] Seizures   [] History of stroke   [] History of TIA  [] Aphasia   [] Vissual changes   [] Weakness or numbness in arm   [] Weakness or numbness in leg Musculoskeletal:   [] Joint swelling   [] Joint pain   [] Low back pain Hematologic:  [] Easy bruising  [] Easy bleeding   [] Hypercoagulable state   [] Anemic Gastrointestinal:  [] Diarrhea   [] Vomiting  [] Gastroesophageal reflux/heartburn   [] Difficulty swallowing. Genitourinary:  [] Chronic kidney disease   [] Difficult urination  [] Frequent urination   [] Blood in urine Skin:  [] Rashes   [] Ulcers  Psychological:  [] History of anxiety   []  History of major depression.  Physical Examination  Vitals:   09/15/22 1329  BP: (!) 169/71  Pulse: (!) 58  Resp: 18  Weight: 160 lb 12.8 oz (72.9 kg)  Height: 5\' 9"  (1.753 m)   Body mass index is 23.75 kg/m. Gen: WD/WN, NAD Head: [place]/AT, No temporalis wasting.  Ear/Nose/Throat: Hearing grossly intact, nares w/o erythema or drainage, pinna without lesions Eyes: PER, EOMI, sclera nonicteric.  Neck: Supple, no gross masses.  No JVD.  Pulmonary:  Good air movement, no audible wheezing, no use of accessory muscles.  Cardiac: RRR, precordium not hyperdynamic. Vascular:  scattered varicosities present bilaterally.  Severe venous stasis changes to the legs bilaterally.  3-4+ soft pitting edema, CEAP C4sEpAsPr  Vessel Right Left  Radial Palpable Palpable  Gastrointestinal: soft, non-distended. No guarding/no peritoneal signs.  Musculoskeletal: M/S 5/5 throughout.  No deformity.  Neurologic: CN 2-12 intact. Pain and light touch intact in extremities.  Symmetrical.  Speech is fluent. Motor exam as listed above. Psychiatric:  Judgment intact, Mood & affect appropriate for pt's clinical situation. Dermatologic: Venous rashes no ulcers noted.  No changes consistent with cellulitis. Lymph : No lichenification or skin changes of chronic lymphedema.  CBC Lab Results  Component Value Date   WBC 6.9 06/19/2022   HGB 12.4 (L) 06/19/2022   HCT 36.0 (L) 06/19/2022   MCV 91.6 06/19/2022   PLT 244 06/19/2022    BMET    Component Value Date/Time   NA 134 (L) 06/19/2022 0800   NA 134 02/25/2022 1101   K 4.0 06/19/2022 0800   CL 101 06/19/2022 0800   CO2 23 06/19/2022 0800   GLUCOSE 99 06/19/2022 0800   BUN 16 06/19/2022 0800  BUN 10 02/25/2022 1101   CREATININE 0.61 06/19/2022 0800   CALCIUM 9.0 06/19/2022 0800   GFRNONAA >60 06/19/2022 0800   GFRAA 91 02/03/2020 1341   CrCl cannot be calculated (Patient's most recent lab result is older than the maximum 21 days allowed.).  COAG Lab Results  Component Value Date   INR 1.2 04/28/2007    Radiology No results found.   Assessment/Plan 1. Pain and swelling of lower leg, unspecified laterality Recommend:  No surgery or intervention at this point in time.   The Patient is CEAP C4sEpAsPr.  The patient has been wearing compression for more than 12 weeks with no or little benefit.  The patient has been exercising daily for more than 12 weeks. The patient has been elevating and taking OTC pain medications for more than 12 weeks.  None of these have have eliminated the pain related to the lymphedema or the discomfort regarding excessive swelling and venous congestion.    I have reviewed my discussion with the patient regarding lymphedema and why it  causes symptoms.  Patient will continue wearing graduated compression on a daily basis. The patient should put the compression on first thing in the morning and removing them in the evening. The patient should not sleep in the compression.   In addition, behavioral modification throughout the day will be continued.   This will include frequent elevation (such as in a recliner), use of over the counter pain medications as needed and exercise such as walking.  The systemic causes for chronic edema such as liver, kidney and cardiac etiologies do not appear to have significant changed over the past year.    The patient has chronic , severe lymphedema with hyperpigmentation of the skin and has done MLD, skin care, medication, diet, exercise, elevation and compression for 4 weeks with no improvement,  I am recommending a lymphedema pump.  The patient still has stage 3 lymphedema and therefore, I believe that a lymph pump is needed to improve the control of the patient's lymphedema and improve the quality of life.  Additionally, a lymph pump is warranted because it will reduce the risk of cellulitis and ulceration in the future.  Patient should follow-up in six months  - VAS [place] LOWER EXTREMITY VENOUS (DVT); Future  2. Lymphedema Recommend:  No surgery or intervention at this point in time.   The Patient is CEAP C4sEpAsPr.  The patient has been wearing compression for more than 12 weeks with no or little benefit.  The patient has been exercising daily for more than 12 weeks. The patient has been elevating and taking OTC pain medications for more than 12 weeks.  None of these have have eliminated the pain related to the lymphedema or the discomfort regarding excessive swelling and venous congestion.    I have reviewed my discussion with the patient regarding lymphedema and why it  causes symptoms.  Patient will continue wearing graduated compression on a daily basis. The patient should put the compression on first thing in the morning and removing them in the evening. The patient should not sleep in the compression.   In addition, behavioral modification throughout the day will be continued.  This will include frequent elevation (such as in a recliner), use of over the counter pain medications as needed and exercise  such as walking.  The systemic causes for chronic edema such as liver, kidney and cardiac etiologies do not appear to have significant changed over the past year.    The  patient has chronic , severe lymphedema with hyperpigmentation of the skin and has done MLD, skin care, medication, diet, exercise, elevation and compression for 4 weeks with no improvement,  I am recommending a lymphedema pump.  The patient still has stage 3 lymphedema and therefore, I believe that a lymph pump is needed to improve the control of the patient's lymphedema and improve the quality of life.  Additionally, a lymph pump is warranted because it will reduce the risk of cellulitis and ulceration in the future.  Patient should follow-up in six months  - VAS [place] LOWER EXTREMITY VENOUS (DVT); Future  3. Primary hypertension Continue antihypertensive medications as already ordered, these medications have been reviewed and there are no changes at this time.  4. Coronary artery disease of native artery of native heart with stable angina pectoris (HCC) Continue cardiac and antihypertensive medications as already ordered and reviewed, no changes at this time.  Continue statin as ordered and reviewed, no changes at this time  Nitrates PRN for chest pain  5. Mixed hyperlipidemia Continue statin as ordered and reviewed, no changes at this time    Hortencia Pilar, MD  09/15/2022 2:37 PM

## 2022-09-16 DIAGNOSIS — M5441 Lumbago with sciatica, right side: Secondary | ICD-10-CM | POA: Diagnosis not present

## 2022-09-16 DIAGNOSIS — R296 Repeated falls: Secondary | ICD-10-CM | POA: Diagnosis not present

## 2022-09-16 DIAGNOSIS — S32411D Displaced fracture of anterior wall of right acetabulum, subsequent encounter for fracture with routine healing: Secondary | ICD-10-CM | POA: Diagnosis not present

## 2022-09-16 DIAGNOSIS — H3530 Unspecified macular degeneration: Secondary | ICD-10-CM | POA: Diagnosis not present

## 2022-09-16 DIAGNOSIS — I2510 Atherosclerotic heart disease of native coronary artery without angina pectoris: Secondary | ICD-10-CM | POA: Diagnosis not present

## 2022-09-16 DIAGNOSIS — I10 Essential (primary) hypertension: Secondary | ICD-10-CM | POA: Diagnosis not present

## 2022-09-16 DIAGNOSIS — I872 Venous insufficiency (chronic) (peripheral): Secondary | ICD-10-CM | POA: Diagnosis not present

## 2022-09-16 DIAGNOSIS — I739 Peripheral vascular disease, unspecified: Secondary | ICD-10-CM | POA: Diagnosis not present

## 2022-09-16 DIAGNOSIS — I890 Lymphedema, not elsewhere classified: Secondary | ICD-10-CM | POA: Diagnosis not present

## 2022-09-16 DIAGNOSIS — S40811D Abrasion of right upper arm, subsequent encounter: Secondary | ICD-10-CM | POA: Diagnosis not present

## 2022-09-16 DIAGNOSIS — Z9181 History of falling: Secondary | ICD-10-CM | POA: Diagnosis not present

## 2022-09-16 DIAGNOSIS — E785 Hyperlipidemia, unspecified: Secondary | ICD-10-CM | POA: Diagnosis not present

## 2022-09-16 NOTE — Telephone Encounter (Signed)
Requested Prescriptions  Pending Prescriptions Disp Refills   rosuvastatin (CRESTOR) 10 MG tablet [Pharmacy Med Name: ROSUVASTATIN CALCIUM 10 MG TAB] 90 tablet 0    Sig: TAKE 1 TABLET BY MOUTH ONCE DAILY     Cardiovascular:  Antilipid - Statins 2 Failed - 09/15/2022  3:39 PM      Failed - Lipid Panel in normal range within the last 12 months    Cholesterol, Total  Date Value Ref Range Status  11/08/2021 107 100 - 199 mg/dL Final   Cholesterol  Date Value Ref Range Status  12/13/2021 105 0 - 200 mg/dL Final   Cholesterol Piccolo, Waived  Date Value Ref Range Status  02/13/2016 123 <200 mg/dL Final    Comment:                            Desirable                <200                         Borderline High      200- 239                         High                     >239    LDL Chol Calc (NIH)  Date Value Ref Range Status  11/08/2021 41 0 - 99 mg/dL Final   LDL Cholesterol  Date Value Ref Range Status  12/13/2021 41 0 - 99 mg/dL Final    Comment:           Total Cholesterol/HDL:CHD Risk Coronary Heart Disease Risk Table                     Men   Women  1/2 Average Risk   3.4   3.3  Average Risk       5.0   4.4  2 X Average Risk   9.6   7.1  3 X Average Risk  23.4   11.0        Use the calculated Patient Ratio above and the CHD Risk Table to determine the patient's CHD Risk.        ATP III CLASSIFICATION (LDL):  <100     mg/dL   Optimal  100-129  mg/dL   Near or Above                    Optimal  130-159  mg/dL   Borderline  160-189  mg/dL   High  >190     mg/dL   Very High Performed at [facility], [place], [place] [address]    HDL  Date Value Ref Range Status  12/13/2021 54 >40 mg/dL Final  11/08/2021 51 >39 mg/dL Final   Triglycerides  Date Value Ref Range Status  12/13/2021 51 <150 mg/dL Final   Triglycerides Piccolo,Waived  Date Value Ref Range Status  02/13/2016 114 <150 mg/dL Final    Comment:                             Normal                   <150  Borderline High     150 - 199                         High                200 - 499                         Very High                >499          Passed - Cr in normal range and within 360 days    Creatinine, Ser  Date Value Ref Range Status  06/19/2022 0.61 0.61 - 1.24 mg/dL Final         Passed - Patient is not pregnant      Passed - Valid encounter within last 12 months    Recent Outpatient Visits           1 month ago Peripheral edema   [place], [place]   1 month ago Cellulitis of both lower extremities   [place], [place], DO   2 months ago Primary hypertension   [place], [place], DO   3 months ago [place] Kathrine Haddock, NP   4 months ago Swelling of left lower extremity   [facility] Mecum, Dani Gobble, PA-C       Future Appointments             In 1 month Gollan, Kathlene November, MD [place] [facility]   In 1 month [name], [place], [place]

## 2022-09-19 ENCOUNTER — Ambulatory Visit: Payer: Self-pay | Admitting: *Deleted

## 2022-09-19 DIAGNOSIS — I872 Venous insufficiency (chronic) (peripheral): Secondary | ICD-10-CM | POA: Diagnosis not present

## 2022-09-19 DIAGNOSIS — R296 Repeated falls: Secondary | ICD-10-CM | POA: Diagnosis not present

## 2022-09-19 DIAGNOSIS — I890 Lymphedema, not elsewhere classified: Secondary | ICD-10-CM | POA: Diagnosis not present

## 2022-09-19 DIAGNOSIS — I2510 Atherosclerotic heart disease of native coronary artery without angina pectoris: Secondary | ICD-10-CM | POA: Diagnosis not present

## 2022-09-19 DIAGNOSIS — M5441 Lumbago with sciatica, right side: Secondary | ICD-10-CM | POA: Diagnosis not present

## 2022-09-19 DIAGNOSIS — S40811D Abrasion of right upper arm, subsequent encounter: Secondary | ICD-10-CM | POA: Diagnosis not present

## 2022-09-19 DIAGNOSIS — I10 Essential (primary) hypertension: Secondary | ICD-10-CM | POA: Diagnosis not present

## 2022-09-19 DIAGNOSIS — H3530 Unspecified macular degeneration: Secondary | ICD-10-CM | POA: Diagnosis not present

## 2022-09-19 DIAGNOSIS — Z9181 History of falling: Secondary | ICD-10-CM | POA: Diagnosis not present

## 2022-09-19 DIAGNOSIS — I739 Peripheral vascular disease, unspecified: Secondary | ICD-10-CM | POA: Diagnosis not present

## 2022-09-19 DIAGNOSIS — S32411D Displaced fracture of anterior wall of right acetabulum, subsequent encounter for fracture with routine healing: Secondary | ICD-10-CM | POA: Diagnosis not present

## 2022-09-19 DIAGNOSIS — E785 Hyperlipidemia, unspecified: Secondary | ICD-10-CM | POA: Diagnosis not present

## 2022-09-19 NOTE — Telephone Encounter (Signed)
  Chief Complaint: Hypertension Symptoms:Home Health nurse 'Collie Siad' reports BP 165/75, today. Also states pt itching, hydroxyzine "Helps a little at times." Nurse states she does not see any rash, bumps, redness. Frequency: today Pertinent Negatives: Patient denies any associated symptoms Disposition: [] ED /[] Urgent Care (no appt availability in office) / [] Appointment(In office/virtual)/ []  [place] [facility]/ [] Home Care/ [] Refused Recommended Disposition /[] [place] [facility]/ [x]  Follow-up with PCP Additional Notes: Reviewed meds with nurse, states is out of HCTZ, should have enough meds until 10/07/22. Pt states took last one yesterday. Unsure pt is taking meds correctly. Assured nurse NT would route to practice for PCPs review.  Answer Assessment - Initial Assessment Questions 1. BLOOD PRESSURE: "What is the blood pressure?" "Did you take at least two measurements 5 minutes apart?"     165/75 2. ONSET: "When did you take your blood pressure?"     HH Nurse today 3. HOW: "How did you take your blood pressure?" (e.g., automatic home BP monitor, visiting nurse)     Nurse 4. HISTORY: "Do you have a history of high blood pressure?"     yes 5. MEDICINES: "Are you taking any medicines for blood pressure?" "Have you missed any doses recently?"     Yes, unsure 6. OTHER SYMPTOMS: "Do you have any symptoms?" (e.g., blurred vision, chest pain, difficulty breathing, headache, weakness)     None  Protocols used: Blood Pressure - High-A-AH

## 2022-09-19 NOTE — Telephone Encounter (Signed)
Called patient niece Nicholas Gross and unable to leave a voice message. Called patient friend [name] to make her aware of Dr Durenda Age recommendations. [name] says she was coming back from [place], but if Nicholas Gross has not picked it up before then. [name] says she would pick it up.  OK for PEC to give note if Nicholas Gross returns office call.

## 2022-09-19 NOTE — Telephone Encounter (Signed)
He has a refill of the HCTZ at the pharmacy. Just needs to pick it up

## 2022-09-23 DIAGNOSIS — I2510 Atherosclerotic heart disease of native coronary artery without angina pectoris: Secondary | ICD-10-CM | POA: Diagnosis not present

## 2022-09-23 DIAGNOSIS — I10 Essential (primary) hypertension: Secondary | ICD-10-CM | POA: Diagnosis not present

## 2022-09-23 DIAGNOSIS — I872 Venous insufficiency (chronic) (peripheral): Secondary | ICD-10-CM | POA: Diagnosis not present

## 2022-09-23 DIAGNOSIS — Z9181 History of falling: Secondary | ICD-10-CM | POA: Diagnosis not present

## 2022-09-23 DIAGNOSIS — H3530 Unspecified macular degeneration: Secondary | ICD-10-CM | POA: Diagnosis not present

## 2022-09-23 DIAGNOSIS — R296 Repeated falls: Secondary | ICD-10-CM | POA: Diagnosis not present

## 2022-09-23 DIAGNOSIS — M5441 Lumbago with sciatica, right side: Secondary | ICD-10-CM | POA: Diagnosis not present

## 2022-09-23 DIAGNOSIS — S40811D Abrasion of right upper arm, subsequent encounter: Secondary | ICD-10-CM | POA: Diagnosis not present

## 2022-09-23 DIAGNOSIS — S32411D Displaced fracture of anterior wall of right acetabulum, subsequent encounter for fracture with routine healing: Secondary | ICD-10-CM | POA: Diagnosis not present

## 2022-09-23 DIAGNOSIS — I890 Lymphedema, not elsewhere classified: Secondary | ICD-10-CM | POA: Diagnosis not present

## 2022-09-23 DIAGNOSIS — I739 Peripheral vascular disease, unspecified: Secondary | ICD-10-CM | POA: Diagnosis not present

## 2022-09-23 DIAGNOSIS — E785 Hyperlipidemia, unspecified: Secondary | ICD-10-CM | POA: Diagnosis not present

## 2022-09-25 DIAGNOSIS — I2510 Atherosclerotic heart disease of native coronary artery without angina pectoris: Secondary | ICD-10-CM | POA: Diagnosis not present

## 2022-09-25 DIAGNOSIS — I10 Essential (primary) hypertension: Secondary | ICD-10-CM | POA: Diagnosis not present

## 2022-09-25 DIAGNOSIS — I890 Lymphedema, not elsewhere classified: Secondary | ICD-10-CM | POA: Diagnosis not present

## 2022-09-25 DIAGNOSIS — S40811D Abrasion of right upper arm, subsequent encounter: Secondary | ICD-10-CM | POA: Diagnosis not present

## 2022-09-25 DIAGNOSIS — I739 Peripheral vascular disease, unspecified: Secondary | ICD-10-CM | POA: Diagnosis not present

## 2022-09-25 DIAGNOSIS — E785 Hyperlipidemia, unspecified: Secondary | ICD-10-CM | POA: Diagnosis not present

## 2022-09-25 DIAGNOSIS — I872 Venous insufficiency (chronic) (peripheral): Secondary | ICD-10-CM | POA: Diagnosis not present

## 2022-09-25 DIAGNOSIS — R296 Repeated falls: Secondary | ICD-10-CM | POA: Diagnosis not present

## 2022-09-25 DIAGNOSIS — Z9181 History of falling: Secondary | ICD-10-CM | POA: Diagnosis not present

## 2022-09-25 DIAGNOSIS — M5441 Lumbago with sciatica, right side: Secondary | ICD-10-CM | POA: Diagnosis not present

## 2022-09-25 DIAGNOSIS — H3530 Unspecified macular degeneration: Secondary | ICD-10-CM | POA: Diagnosis not present

## 2022-09-25 DIAGNOSIS — S32411D Displaced fracture of anterior wall of right acetabulum, subsequent encounter for fracture with routine healing: Secondary | ICD-10-CM | POA: Diagnosis not present

## 2022-09-26 DIAGNOSIS — E785 Hyperlipidemia, unspecified: Secondary | ICD-10-CM | POA: Diagnosis not present

## 2022-09-26 DIAGNOSIS — I2510 Atherosclerotic heart disease of native coronary artery without angina pectoris: Secondary | ICD-10-CM | POA: Diagnosis not present

## 2022-09-26 DIAGNOSIS — M5441 Lumbago with sciatica, right side: Secondary | ICD-10-CM | POA: Diagnosis not present

## 2022-09-26 DIAGNOSIS — S32411D Displaced fracture of anterior wall of right acetabulum, subsequent encounter for fracture with routine healing: Secondary | ICD-10-CM | POA: Diagnosis not present

## 2022-09-26 DIAGNOSIS — I890 Lymphedema, not elsewhere classified: Secondary | ICD-10-CM | POA: Diagnosis not present

## 2022-09-26 DIAGNOSIS — I10 Essential (primary) hypertension: Secondary | ICD-10-CM | POA: Diagnosis not present

## 2022-09-26 DIAGNOSIS — R296 Repeated falls: Secondary | ICD-10-CM | POA: Diagnosis not present

## 2022-09-26 DIAGNOSIS — I739 Peripheral vascular disease, unspecified: Secondary | ICD-10-CM | POA: Diagnosis not present

## 2022-09-26 DIAGNOSIS — Z9181 History of falling: Secondary | ICD-10-CM | POA: Diagnosis not present

## 2022-09-26 DIAGNOSIS — H3530 Unspecified macular degeneration: Secondary | ICD-10-CM | POA: Diagnosis not present

## 2022-09-26 DIAGNOSIS — S40811D Abrasion of right upper arm, subsequent encounter: Secondary | ICD-10-CM | POA: Diagnosis not present

## 2022-09-26 DIAGNOSIS — I872 Venous insufficiency (chronic) (peripheral): Secondary | ICD-10-CM | POA: Diagnosis not present

## 2022-09-29 ENCOUNTER — Telehealth: Payer: Self-pay | Admitting: Internal Medicine

## 2022-09-29 DIAGNOSIS — M5441 Lumbago with sciatica, right side: Secondary | ICD-10-CM | POA: Diagnosis not present

## 2022-09-29 DIAGNOSIS — I872 Venous insufficiency (chronic) (peripheral): Secondary | ICD-10-CM | POA: Diagnosis not present

## 2022-09-29 DIAGNOSIS — H3530 Unspecified macular degeneration: Secondary | ICD-10-CM | POA: Diagnosis not present

## 2022-09-29 DIAGNOSIS — Z9181 History of falling: Secondary | ICD-10-CM | POA: Diagnosis not present

## 2022-09-29 DIAGNOSIS — I2510 Atherosclerotic heart disease of native coronary artery without angina pectoris: Secondary | ICD-10-CM | POA: Diagnosis not present

## 2022-09-29 DIAGNOSIS — E785 Hyperlipidemia, unspecified: Secondary | ICD-10-CM | POA: Diagnosis not present

## 2022-09-29 DIAGNOSIS — I890 Lymphedema, not elsewhere classified: Secondary | ICD-10-CM | POA: Diagnosis not present

## 2022-09-29 DIAGNOSIS — S40811D Abrasion of right upper arm, subsequent encounter: Secondary | ICD-10-CM | POA: Diagnosis not present

## 2022-09-29 DIAGNOSIS — I10 Essential (primary) hypertension: Secondary | ICD-10-CM | POA: Diagnosis not present

## 2022-09-29 DIAGNOSIS — S32411D Displaced fracture of anterior wall of right acetabulum, subsequent encounter for fracture with routine healing: Secondary | ICD-10-CM | POA: Diagnosis not present

## 2022-09-29 DIAGNOSIS — R296 Repeated falls: Secondary | ICD-10-CM | POA: Diagnosis not present

## 2022-09-29 DIAGNOSIS — I739 Peripheral vascular disease, unspecified: Secondary | ICD-10-CM | POA: Diagnosis not present

## 2022-09-29 NOTE — Telephone Encounter (Signed)
Received call over the weekend about increased swelling, weeping and redness of the lower extremities.  Has a history of cellulitis.  He has difficulty leaving home due to balance and gait issues.  Requested antibiotic called in over the weekend until he can be evaluated.  Rx for Keflex 500 mg 3 times daily sent to pharmacy.

## 2022-10-01 ENCOUNTER — Telehealth: Payer: Self-pay

## 2022-10-01 NOTE — Telephone Encounter (Signed)
Dr Laural Benes would like for patient to see her or Nicholas Gum, NP tomorrow 10/02/22 as she is concerned patient may have an infection. Could you ladies call and get patient scheduled for sooner appointment.

## 2022-10-01 NOTE — Telephone Encounter (Signed)
Scheduled patient with Nicholas Gross on 10/02/2022 @ 1:20 pm.

## 2022-10-02 ENCOUNTER — Ambulatory Visit (INDEPENDENT_AMBULATORY_CARE_PROVIDER_SITE_OTHER): Payer: Medicare Other | Admitting: Family Medicine

## 2022-10-02 ENCOUNTER — Encounter: Payer: Self-pay | Admitting: Family Medicine

## 2022-10-02 VITALS — BP 158/62 | HR 57 | Temp 97.3°F | Wt 162.4 lb

## 2022-10-02 DIAGNOSIS — I890 Lymphedema, not elsewhere classified: Secondary | ICD-10-CM | POA: Diagnosis not present

## 2022-10-02 DIAGNOSIS — L03116 Cellulitis of left lower limb: Secondary | ICD-10-CM | POA: Diagnosis not present

## 2022-10-02 DIAGNOSIS — L03115 Cellulitis of right lower limb: Secondary | ICD-10-CM | POA: Diagnosis not present

## 2022-10-02 DIAGNOSIS — I872 Venous insufficiency (chronic) (peripheral): Secondary | ICD-10-CM | POA: Diagnosis not present

## 2022-10-02 DIAGNOSIS — I10 Essential (primary) hypertension: Secondary | ICD-10-CM | POA: Diagnosis not present

## 2022-10-02 DIAGNOSIS — H3530 Unspecified macular degeneration: Secondary | ICD-10-CM | POA: Diagnosis not present

## 2022-10-02 DIAGNOSIS — I2510 Atherosclerotic heart disease of native coronary artery without angina pectoris: Secondary | ICD-10-CM | POA: Diagnosis not present

## 2022-10-02 DIAGNOSIS — M5441 Lumbago with sciatica, right side: Secondary | ICD-10-CM | POA: Diagnosis not present

## 2022-10-02 DIAGNOSIS — S40811D Abrasion of right upper arm, subsequent encounter: Secondary | ICD-10-CM | POA: Diagnosis not present

## 2022-10-02 DIAGNOSIS — R296 Repeated falls: Secondary | ICD-10-CM | POA: Diagnosis not present

## 2022-10-02 DIAGNOSIS — E785 Hyperlipidemia, unspecified: Secondary | ICD-10-CM | POA: Diagnosis not present

## 2022-10-02 DIAGNOSIS — S32411D Displaced fracture of anterior wall of right acetabulum, subsequent encounter for fracture with routine healing: Secondary | ICD-10-CM | POA: Diagnosis not present

## 2022-10-02 DIAGNOSIS — Z9181 History of falling: Secondary | ICD-10-CM | POA: Diagnosis not present

## 2022-10-02 DIAGNOSIS — I739 Peripheral vascular disease, unspecified: Secondary | ICD-10-CM | POA: Diagnosis not present

## 2022-10-02 NOTE — Patient Instructions (Addendum)
Elevate legs during the day Keep legs moisturized daily, after bath Wear protective shoes Wear new pair of socks each day  Drink enough fluid to keep your pee (urine) pale yellow. Do not touch or rub the infected area. Raise (elevate) the infected area above the level of your heart while you are sitting or lying down. Return to your normal activities when your doctor says that it is safe. Place cold or warm cloths on the area as told by your doctor.

## 2022-10-02 NOTE — Assessment & Plan Note (Signed)
Chronic, reocurring. Continue course of Keflex until finished. Recommend keeping legs moisturized, elevated, and wear protective socks and shoes. Continue with wound care RN services. Keep wound area covered with dressing. Follow up if symptoms worsen.

## 2022-10-02 NOTE — Progress Notes (Signed)
BP (!) 158/62   Pulse (!) 57   Temp (!) 97.3 F (36.3 C)   Wt 162 lb 6.4 oz (73.7 kg)   SpO2 100%   BMI 23.98 kg/m    Subjective:    Patient ID: Nicholas Gross, male    DOB: [birth year], 89 y.o.   MRN: [id number]  HPI: Nicholas Gross is a 89 y.o. male  Chief Complaint  Patient presents with   infection    Cellulitis on right foot   CELLULITIS OF BILATERAL LOWER EXTREMITIES He is on day 6 of 7 of his Keflex dose. He is also seeing vascular and cardiology. He states his wound is better after starting the Keflex.  Duration:  chronic, has had this in the left leg before.  Location: bilateral edema, right anterior foot wound History of trauma in area: no Pain: no Severity:  Improving Redness: yes Swelling: yes Oozing: no Pus: yes Fevers: no Nausea/vomiting: no Status: better Treatments attempted:Antibiotics  Relevant past medical, surgical, family and social history reviewed and updated as indicated. Interim medical history since our last visit reviewed. Allergies and medications reviewed and updated.  Review of Systems  Respiratory: Negative.    Cardiovascular: Negative.   Skin:  Positive for color change and wound.       Bilateral swelling, wound on anterior right foot with dressing without pain.   Neurological: Negative.     Per HPI unless specifically indicated above     Objective:    BP (!) 158/62   Pulse (!) 57   Temp (!) 97.3 F (36.3 C)   Wt 162 lb 6.4 oz (73.7 kg)   SpO2 100%   BMI 23.98 kg/m   Wt Readings from Last 3 Encounters:  10/02/22 162 lb 6.4 oz (73.7 kg)  09/15/22 160 lb 12.8 oz (72.9 kg)  08/15/22 157 lb 14.4 oz (71.6 kg)    Physical Exam Vitals and nursing note reviewed.  Constitutional:      General: He is awake. He is not in acute distress.    Appearance: Normal appearance. He is well-developed and well-groomed. He is not ill-appearing.  HENT:     Head: Normocephalic and atraumatic.     Right Ear: Hearing and external ear  normal. No drainage.     Left Ear: Hearing and external ear normal. No drainage.     Nose: Nose normal.  Eyes:     General: Lids are normal.        Right eye: No discharge.        Left eye: No discharge.     Conjunctiva/sclera: Conjunctivae normal.  Cardiovascular:     Rate and Rhythm: Rhythm irregular.     Heart sounds: Murmur heard.     No gallop.  Pulmonary:     Effort: Pulmonary effort is normal. No accessory muscle usage or respiratory distress.     Breath sounds: Normal breath sounds.  Musculoskeletal:        General: Normal range of motion.     Cervical back: Full passive range of motion without pain and normal range of motion.     Right lower leg: 1+ Pitting Edema present.     Left lower leg: 1+ Pitting Edema present.  Skin:    General: Skin is warm and dry.     Capillary Refill: Capillary refill takes less than 2 seconds.     Coloration: Skin is ashen and pale.     Findings: Rash and wound present.  Comments: Bilateral lower extremities dry, flaky, ashen  Neurological:     Mental Status: He is alert and oriented to person, place, and time.  Psychiatric:        Attention and Perception: Attention normal.        Mood and Affect: Mood normal.        Speech: Speech normal.        Behavior: Behavior normal. Behavior is cooperative.        Thought Content: Thought content normal.     Results for orders placed or performed during the hospital encounter of 06/17/22  Basic metabolic panel  Result Value Ref Range   Sodium 131 (L) 135 - 145 mmol/L   Potassium 3.9 3.5 - 5.1 mmol/L   Chloride 97 (L) 98 - 111 mmol/L   CO2 25 22 - 32 mmol/L   Glucose, Bld 101 (H) 70 - 99 mg/dL   BUN 25 (H) 8 - 23 mg/dL   Creatinine, Ser 1.611.09 0.61 - 1.24 mg/dL   Calcium 9.1 8.9 - 09.610.3 mg/dL   GFR, Estimated >04>60 >54>60 mL/min   Anion gap 9 5 - 15  CBC  Result Value Ref Range   WBC 7.5 4.0 - 10.5 K/uL   RBC 3.67 (L) 4.22 - 5.81 MIL/uL   Hemoglobin 11.7 (L) 13.0 - 17.0 g/dL   HCT  09.834.4 (L) 11.939.0 - 52.0 %   MCV 93.7 80.0 - 100.0 fL   MCH 31.9 26.0 - 34.0 pg   MCHC 34.0 30.0 - 36.0 g/dL   RDW 14.711.6 82.911.5 - 56.215.5 %   Platelets 224 150 - 400 K/uL   nRBC 0.0 0.0 - 0.2 %  Urinalysis, Routine w reflex microscopic  Result Value Ref Range   Color, Urine YELLOW (A) YELLOW   APPearance CLEAR (A) CLEAR   Specific Gravity, Urine 1.017 1.005 - 1.030   pH 6.0 5.0 - 8.0   Glucose, UA NEGATIVE NEGATIVE mg/dL   Hgb urine dipstick NEGATIVE NEGATIVE   Bilirubin Urine NEGATIVE NEGATIVE   Ketones, ur NEGATIVE NEGATIVE mg/dL   Protein, ur NEGATIVE NEGATIVE mg/dL   Nitrite NEGATIVE NEGATIVE   Leukocytes,Ua NEGATIVE NEGATIVE   RBC / HPF 6-10 0 - 5 RBC/hpf   WBC, UA 0-5 0 - 5 WBC/hpf   Bacteria, UA NONE SEEN NONE SEEN   Squamous Epithelial / HPF NONE SEEN 0 - 5  Procalcitonin - Baseline  Result Value Ref Range   Procalcitonin <0.10 ng/mL  Vitamin B12  Result Value Ref Range   Vitamin B-12 605 180 - 914 pg/mL  CBC with Differential/Platelet  Result Value Ref Range   WBC 6.9 4.0 - 10.5 K/uL   RBC 3.93 (L) 4.22 - 5.81 MIL/uL   Hemoglobin 12.4 (L) 13.0 - 17.0 g/dL   HCT 13.036.0 (L) 86.539.0 - 78.452.0 %   MCV 91.6 80.0 - 100.0 fL   MCH 31.6 26.0 - 34.0 pg   MCHC 34.4 30.0 - 36.0 g/dL   RDW 69.611.5 29.511.5 - 28.415.5 %   Platelets 244 150 - 400 K/uL   nRBC 0.0 0.0 - 0.2 %   Neutrophils Relative % 66 %   Neutro Abs 4.6 1.7 - 7.7 K/uL   Lymphocytes Relative 17 %   Lymphs Abs 1.2 0.7 - 4.0 K/uL   Monocytes Relative 9 %   Monocytes Absolute 0.7 0.1 - 1.0 K/uL   Eosinophils Relative 6 %   Eosinophils Absolute 0.4 0.0 - 0.5 K/uL   Basophils Relative 1 %  Basophils Absolute 0.1 0.0 - 0.1 K/uL   Immature Granulocytes 1 %   Abs Immature Granulocytes 0.04 0.00 - 0.07 K/uL  Basic metabolic panel  Result Value Ref Range   Sodium 134 (L) 135 - 145 mmol/L   Potassium 4.0 3.5 - 5.1 mmol/L   Chloride 101 98 - 111 mmol/L   CO2 23 22 - 32 mmol/L   Glucose, Bld 99 70 - 99 mg/dL   BUN 16 8 - 23 mg/dL    Creatinine, Ser 6.38 0.61 - 1.24 mg/dL   Calcium 9.0 8.9 - 93.7 mg/dL   GFR, Estimated >34 >28 mL/min   Anion gap 10 5 - 15  Troponin I (High Sensitivity)  Result Value Ref Range   Troponin I (High Sensitivity) 10 <18 ng/L      Assessment & Plan:   Problem List Items Addressed This Visit       Other   Cellulitis of both lower extremities - Primary    Chronic, reocurring. Continue course of Keflex until finished. Recommend keeping legs moisturized, elevated, and wear protective socks and shoes. Continue with wound care RN services. Keep wound area covered with dressing. Follow up if symptoms worsen.         Follow up plan: Return in about 6 weeks (around 11/14/2022), or if symptoms worsen or fail to improve, for Follow up.

## 2022-10-03 ENCOUNTER — Telehealth (INDEPENDENT_AMBULATORY_CARE_PROVIDER_SITE_OTHER): Payer: Self-pay

## 2022-10-03 DIAGNOSIS — I10 Essential (primary) hypertension: Secondary | ICD-10-CM | POA: Diagnosis not present

## 2022-10-03 DIAGNOSIS — H3530 Unspecified macular degeneration: Secondary | ICD-10-CM | POA: Diagnosis not present

## 2022-10-03 DIAGNOSIS — Z9181 History of falling: Secondary | ICD-10-CM | POA: Diagnosis not present

## 2022-10-03 DIAGNOSIS — I890 Lymphedema, not elsewhere classified: Secondary | ICD-10-CM | POA: Diagnosis not present

## 2022-10-03 DIAGNOSIS — R296 Repeated falls: Secondary | ICD-10-CM | POA: Diagnosis not present

## 2022-10-03 DIAGNOSIS — I739 Peripheral vascular disease, unspecified: Secondary | ICD-10-CM | POA: Diagnosis not present

## 2022-10-03 DIAGNOSIS — S41101A Unspecified open wound of right upper arm, initial encounter: Secondary | ICD-10-CM | POA: Diagnosis not present

## 2022-10-03 DIAGNOSIS — S32411D Displaced fracture of anterior wall of right acetabulum, subsequent encounter for fracture with routine healing: Secondary | ICD-10-CM | POA: Diagnosis not present

## 2022-10-03 DIAGNOSIS — I872 Venous insufficiency (chronic) (peripheral): Secondary | ICD-10-CM | POA: Diagnosis not present

## 2022-10-03 DIAGNOSIS — S40811D Abrasion of right upper arm, subsequent encounter: Secondary | ICD-10-CM | POA: Diagnosis not present

## 2022-10-03 DIAGNOSIS — I2510 Atherosclerotic heart disease of native coronary artery without angina pectoris: Secondary | ICD-10-CM | POA: Diagnosis not present

## 2022-10-03 DIAGNOSIS — M5441 Lumbago with sciatica, right side: Secondary | ICD-10-CM | POA: Diagnosis not present

## 2022-10-03 DIAGNOSIS — E785 Hyperlipidemia, unspecified: Secondary | ICD-10-CM | POA: Diagnosis not present

## 2022-10-03 NOTE — Telephone Encounter (Signed)
I received this from Sherlyn Lick at [facility]:  HI Vernona Rieger, I wanted to let you know that we have reached out to  patient MRN [id number] twice. Each time he seems rather confused and there may be some cognitive issues, he kept saying "I don't need no pump". Didn't seem like he understood that a doctor ordered this for him.  Do you or the doctor want to speak with him? Thank you!

## 2022-10-06 DIAGNOSIS — S32411D Displaced fracture of anterior wall of right acetabulum, subsequent encounter for fracture with routine healing: Secondary | ICD-10-CM | POA: Diagnosis not present

## 2022-10-06 DIAGNOSIS — E785 Hyperlipidemia, unspecified: Secondary | ICD-10-CM | POA: Diagnosis not present

## 2022-10-06 DIAGNOSIS — I872 Venous insufficiency (chronic) (peripheral): Secondary | ICD-10-CM | POA: Diagnosis not present

## 2022-10-06 DIAGNOSIS — M5441 Lumbago with sciatica, right side: Secondary | ICD-10-CM | POA: Diagnosis not present

## 2022-10-06 DIAGNOSIS — H3530 Unspecified macular degeneration: Secondary | ICD-10-CM | POA: Diagnosis not present

## 2022-10-06 DIAGNOSIS — I2510 Atherosclerotic heart disease of native coronary artery without angina pectoris: Secondary | ICD-10-CM | POA: Diagnosis not present

## 2022-10-06 DIAGNOSIS — I10 Essential (primary) hypertension: Secondary | ICD-10-CM | POA: Diagnosis not present

## 2022-10-06 DIAGNOSIS — I890 Lymphedema, not elsewhere classified: Secondary | ICD-10-CM | POA: Diagnosis not present

## 2022-10-06 DIAGNOSIS — S40811D Abrasion of right upper arm, subsequent encounter: Secondary | ICD-10-CM | POA: Diagnosis not present

## 2022-10-06 DIAGNOSIS — I739 Peripheral vascular disease, unspecified: Secondary | ICD-10-CM | POA: Diagnosis not present

## 2022-10-06 DIAGNOSIS — Z9181 History of falling: Secondary | ICD-10-CM | POA: Diagnosis not present

## 2022-10-06 DIAGNOSIS — R296 Repeated falls: Secondary | ICD-10-CM | POA: Diagnosis not present

## 2022-10-08 DIAGNOSIS — Z9181 History of falling: Secondary | ICD-10-CM | POA: Diagnosis not present

## 2022-10-08 DIAGNOSIS — I739 Peripheral vascular disease, unspecified: Secondary | ICD-10-CM | POA: Diagnosis not present

## 2022-10-08 DIAGNOSIS — H3530 Unspecified macular degeneration: Secondary | ICD-10-CM | POA: Diagnosis not present

## 2022-10-08 DIAGNOSIS — E785 Hyperlipidemia, unspecified: Secondary | ICD-10-CM | POA: Diagnosis not present

## 2022-10-08 DIAGNOSIS — I2510 Atherosclerotic heart disease of native coronary artery without angina pectoris: Secondary | ICD-10-CM | POA: Diagnosis not present

## 2022-10-08 DIAGNOSIS — I10 Essential (primary) hypertension: Secondary | ICD-10-CM | POA: Diagnosis not present

## 2022-10-08 DIAGNOSIS — I890 Lymphedema, not elsewhere classified: Secondary | ICD-10-CM | POA: Diagnosis not present

## 2022-10-08 DIAGNOSIS — S32411D Displaced fracture of anterior wall of right acetabulum, subsequent encounter for fracture with routine healing: Secondary | ICD-10-CM | POA: Diagnosis not present

## 2022-10-08 DIAGNOSIS — R296 Repeated falls: Secondary | ICD-10-CM | POA: Diagnosis not present

## 2022-10-08 DIAGNOSIS — I872 Venous insufficiency (chronic) (peripheral): Secondary | ICD-10-CM | POA: Diagnosis not present

## 2022-10-08 DIAGNOSIS — M5441 Lumbago with sciatica, right side: Secondary | ICD-10-CM | POA: Diagnosis not present

## 2022-10-08 DIAGNOSIS — S40811D Abrasion of right upper arm, subsequent encounter: Secondary | ICD-10-CM | POA: Diagnosis not present

## 2022-10-10 DIAGNOSIS — E785 Hyperlipidemia, unspecified: Secondary | ICD-10-CM | POA: Diagnosis not present

## 2022-10-10 DIAGNOSIS — S32411D Displaced fracture of anterior wall of right acetabulum, subsequent encounter for fracture with routine healing: Secondary | ICD-10-CM | POA: Diagnosis not present

## 2022-10-10 DIAGNOSIS — I2510 Atherosclerotic heart disease of native coronary artery without angina pectoris: Secondary | ICD-10-CM | POA: Diagnosis not present

## 2022-10-10 DIAGNOSIS — I739 Peripheral vascular disease, unspecified: Secondary | ICD-10-CM | POA: Diagnosis not present

## 2022-10-10 DIAGNOSIS — S40811D Abrasion of right upper arm, subsequent encounter: Secondary | ICD-10-CM | POA: Diagnosis not present

## 2022-10-10 DIAGNOSIS — I890 Lymphedema, not elsewhere classified: Secondary | ICD-10-CM | POA: Diagnosis not present

## 2022-10-10 DIAGNOSIS — M5441 Lumbago with sciatica, right side: Secondary | ICD-10-CM | POA: Diagnosis not present

## 2022-10-10 DIAGNOSIS — H3530 Unspecified macular degeneration: Secondary | ICD-10-CM | POA: Diagnosis not present

## 2022-10-10 DIAGNOSIS — R296 Repeated falls: Secondary | ICD-10-CM | POA: Diagnosis not present

## 2022-10-10 DIAGNOSIS — Z9181 History of falling: Secondary | ICD-10-CM | POA: Diagnosis not present

## 2022-10-10 DIAGNOSIS — I872 Venous insufficiency (chronic) (peripheral): Secondary | ICD-10-CM | POA: Diagnosis not present

## 2022-10-10 DIAGNOSIS — I10 Essential (primary) hypertension: Secondary | ICD-10-CM | POA: Diagnosis not present

## 2022-10-13 DIAGNOSIS — S32411D Displaced fracture of anterior wall of right acetabulum, subsequent encounter for fracture with routine healing: Secondary | ICD-10-CM | POA: Diagnosis not present

## 2022-10-13 DIAGNOSIS — E785 Hyperlipidemia, unspecified: Secondary | ICD-10-CM | POA: Diagnosis not present

## 2022-10-13 DIAGNOSIS — I10 Essential (primary) hypertension: Secondary | ICD-10-CM | POA: Diagnosis not present

## 2022-10-13 DIAGNOSIS — H3530 Unspecified macular degeneration: Secondary | ICD-10-CM | POA: Diagnosis not present

## 2022-10-13 DIAGNOSIS — R296 Repeated falls: Secondary | ICD-10-CM | POA: Diagnosis not present

## 2022-10-13 DIAGNOSIS — I890 Lymphedema, not elsewhere classified: Secondary | ICD-10-CM | POA: Diagnosis not present

## 2022-10-13 DIAGNOSIS — I2510 Atherosclerotic heart disease of native coronary artery without angina pectoris: Secondary | ICD-10-CM | POA: Diagnosis not present

## 2022-10-13 DIAGNOSIS — I739 Peripheral vascular disease, unspecified: Secondary | ICD-10-CM | POA: Diagnosis not present

## 2022-10-13 DIAGNOSIS — M5441 Lumbago with sciatica, right side: Secondary | ICD-10-CM | POA: Diagnosis not present

## 2022-10-13 DIAGNOSIS — Z9181 History of falling: Secondary | ICD-10-CM | POA: Diagnosis not present

## 2022-10-13 DIAGNOSIS — S40811D Abrasion of right upper arm, subsequent encounter: Secondary | ICD-10-CM | POA: Diagnosis not present

## 2022-10-13 DIAGNOSIS — I872 Venous insufficiency (chronic) (peripheral): Secondary | ICD-10-CM | POA: Diagnosis not present

## 2022-10-16 DIAGNOSIS — Z9181 History of falling: Secondary | ICD-10-CM | POA: Diagnosis not present

## 2022-10-16 DIAGNOSIS — M5441 Lumbago with sciatica, right side: Secondary | ICD-10-CM | POA: Diagnosis not present

## 2022-10-16 DIAGNOSIS — I890 Lymphedema, not elsewhere classified: Secondary | ICD-10-CM | POA: Diagnosis not present

## 2022-10-16 DIAGNOSIS — I10 Essential (primary) hypertension: Secondary | ICD-10-CM | POA: Diagnosis not present

## 2022-10-16 DIAGNOSIS — R296 Repeated falls: Secondary | ICD-10-CM | POA: Diagnosis not present

## 2022-10-16 DIAGNOSIS — I2510 Atherosclerotic heart disease of native coronary artery without angina pectoris: Secondary | ICD-10-CM | POA: Diagnosis not present

## 2022-10-16 DIAGNOSIS — S40811D Abrasion of right upper arm, subsequent encounter: Secondary | ICD-10-CM | POA: Diagnosis not present

## 2022-10-16 DIAGNOSIS — S32411D Displaced fracture of anterior wall of right acetabulum, subsequent encounter for fracture with routine healing: Secondary | ICD-10-CM | POA: Diagnosis not present

## 2022-10-16 DIAGNOSIS — I739 Peripheral vascular disease, unspecified: Secondary | ICD-10-CM | POA: Diagnosis not present

## 2022-10-16 DIAGNOSIS — E785 Hyperlipidemia, unspecified: Secondary | ICD-10-CM | POA: Diagnosis not present

## 2022-10-16 DIAGNOSIS — H3530 Unspecified macular degeneration: Secondary | ICD-10-CM | POA: Diagnosis not present

## 2022-10-16 DIAGNOSIS — I872 Venous insufficiency (chronic) (peripheral): Secondary | ICD-10-CM | POA: Diagnosis not present

## 2022-10-20 DIAGNOSIS — Z9181 History of falling: Secondary | ICD-10-CM | POA: Diagnosis not present

## 2022-10-20 DIAGNOSIS — S40811D Abrasion of right upper arm, subsequent encounter: Secondary | ICD-10-CM | POA: Diagnosis not present

## 2022-10-20 DIAGNOSIS — E785 Hyperlipidemia, unspecified: Secondary | ICD-10-CM | POA: Diagnosis not present

## 2022-10-20 DIAGNOSIS — S32411D Displaced fracture of anterior wall of right acetabulum, subsequent encounter for fracture with routine healing: Secondary | ICD-10-CM | POA: Diagnosis not present

## 2022-10-20 DIAGNOSIS — I2510 Atherosclerotic heart disease of native coronary artery without angina pectoris: Secondary | ICD-10-CM | POA: Diagnosis not present

## 2022-10-20 DIAGNOSIS — R296 Repeated falls: Secondary | ICD-10-CM | POA: Diagnosis not present

## 2022-10-20 DIAGNOSIS — H3530 Unspecified macular degeneration: Secondary | ICD-10-CM | POA: Diagnosis not present

## 2022-10-20 DIAGNOSIS — I890 Lymphedema, not elsewhere classified: Secondary | ICD-10-CM | POA: Diagnosis not present

## 2022-10-20 DIAGNOSIS — M5441 Lumbago with sciatica, right side: Secondary | ICD-10-CM | POA: Diagnosis not present

## 2022-10-20 DIAGNOSIS — I10 Essential (primary) hypertension: Secondary | ICD-10-CM | POA: Diagnosis not present

## 2022-10-20 DIAGNOSIS — I872 Venous insufficiency (chronic) (peripheral): Secondary | ICD-10-CM | POA: Diagnosis not present

## 2022-10-20 DIAGNOSIS — I739 Peripheral vascular disease, unspecified: Secondary | ICD-10-CM | POA: Diagnosis not present

## 2022-10-22 DIAGNOSIS — I10 Essential (primary) hypertension: Secondary | ICD-10-CM | POA: Diagnosis not present

## 2022-10-22 DIAGNOSIS — I2510 Atherosclerotic heart disease of native coronary artery without angina pectoris: Secondary | ICD-10-CM | POA: Diagnosis not present

## 2022-10-22 DIAGNOSIS — Z9181 History of falling: Secondary | ICD-10-CM | POA: Diagnosis not present

## 2022-10-22 DIAGNOSIS — H3530 Unspecified macular degeneration: Secondary | ICD-10-CM | POA: Diagnosis not present

## 2022-10-22 DIAGNOSIS — I872 Venous insufficiency (chronic) (peripheral): Secondary | ICD-10-CM | POA: Diagnosis not present

## 2022-10-22 DIAGNOSIS — M5441 Lumbago with sciatica, right side: Secondary | ICD-10-CM | POA: Diagnosis not present

## 2022-10-22 DIAGNOSIS — E785 Hyperlipidemia, unspecified: Secondary | ICD-10-CM | POA: Diagnosis not present

## 2022-10-22 DIAGNOSIS — I890 Lymphedema, not elsewhere classified: Secondary | ICD-10-CM | POA: Diagnosis not present

## 2022-10-22 DIAGNOSIS — I739 Peripheral vascular disease, unspecified: Secondary | ICD-10-CM | POA: Diagnosis not present

## 2022-10-22 DIAGNOSIS — R296 Repeated falls: Secondary | ICD-10-CM | POA: Diagnosis not present

## 2022-10-22 DIAGNOSIS — S32411D Displaced fracture of anterior wall of right acetabulum, subsequent encounter for fracture with routine healing: Secondary | ICD-10-CM | POA: Diagnosis not present

## 2022-10-27 ENCOUNTER — Telehealth: Payer: Self-pay | Admitting: Family Medicine

## 2022-10-27 DIAGNOSIS — I739 Peripheral vascular disease, unspecified: Secondary | ICD-10-CM | POA: Diagnosis not present

## 2022-10-27 DIAGNOSIS — M5441 Lumbago with sciatica, right side: Secondary | ICD-10-CM | POA: Diagnosis not present

## 2022-10-27 DIAGNOSIS — Z9181 History of falling: Secondary | ICD-10-CM | POA: Diagnosis not present

## 2022-10-27 DIAGNOSIS — I10 Essential (primary) hypertension: Secondary | ICD-10-CM | POA: Diagnosis not present

## 2022-10-27 DIAGNOSIS — S32411D Displaced fracture of anterior wall of right acetabulum, subsequent encounter for fracture with routine healing: Secondary | ICD-10-CM | POA: Diagnosis not present

## 2022-10-27 DIAGNOSIS — E785 Hyperlipidemia, unspecified: Secondary | ICD-10-CM | POA: Diagnosis not present

## 2022-10-27 DIAGNOSIS — I890 Lymphedema, not elsewhere classified: Secondary | ICD-10-CM | POA: Diagnosis not present

## 2022-10-27 DIAGNOSIS — I872 Venous insufficiency (chronic) (peripheral): Secondary | ICD-10-CM | POA: Diagnosis not present

## 2022-10-27 DIAGNOSIS — I2510 Atherosclerotic heart disease of native coronary artery without angina pectoris: Secondary | ICD-10-CM | POA: Diagnosis not present

## 2022-10-27 DIAGNOSIS — R296 Repeated falls: Secondary | ICD-10-CM | POA: Diagnosis not present

## 2022-10-27 DIAGNOSIS — H3530 Unspecified macular degeneration: Secondary | ICD-10-CM | POA: Diagnosis not present

## 2022-10-27 NOTE — Telephone Encounter (Signed)
Noted  

## 2022-10-27 NOTE — Telephone Encounter (Unsigned)
Copied from CRM [id number]. Topic: General - Other >> Oct 27, 2022 12:31 PM Ja-Kwan M wrote: Reason for CRM: Lequita Halt with [facility] reports that patient had a fall but no injuries. Per Vinnie Langton they will continue PT for 1 time a week for 1 week. Cb# [phone]

## 2022-10-28 ENCOUNTER — Ambulatory Visit: Payer: Medicare Other | Admitting: Cardiovascular Disease

## 2022-11-05 DIAGNOSIS — I10 Essential (primary) hypertension: Secondary | ICD-10-CM | POA: Diagnosis not present

## 2022-11-05 DIAGNOSIS — I872 Venous insufficiency (chronic) (peripheral): Secondary | ICD-10-CM | POA: Diagnosis not present

## 2022-11-05 DIAGNOSIS — H3530 Unspecified macular degeneration: Secondary | ICD-10-CM | POA: Diagnosis not present

## 2022-11-05 DIAGNOSIS — Z9181 History of falling: Secondary | ICD-10-CM | POA: Diagnosis not present

## 2022-11-05 DIAGNOSIS — I890 Lymphedema, not elsewhere classified: Secondary | ICD-10-CM | POA: Diagnosis not present

## 2022-11-05 DIAGNOSIS — E785 Hyperlipidemia, unspecified: Secondary | ICD-10-CM | POA: Diagnosis not present

## 2022-11-05 DIAGNOSIS — I2510 Atherosclerotic heart disease of native coronary artery without angina pectoris: Secondary | ICD-10-CM | POA: Diagnosis not present

## 2022-11-05 DIAGNOSIS — R296 Repeated falls: Secondary | ICD-10-CM | POA: Diagnosis not present

## 2022-11-05 DIAGNOSIS — M5441 Lumbago with sciatica, right side: Secondary | ICD-10-CM | POA: Diagnosis not present

## 2022-11-05 DIAGNOSIS — I739 Peripheral vascular disease, unspecified: Secondary | ICD-10-CM | POA: Diagnosis not present

## 2022-11-05 DIAGNOSIS — S32411D Displaced fracture of anterior wall of right acetabulum, subsequent encounter for fracture with routine healing: Secondary | ICD-10-CM | POA: Diagnosis not present

## 2022-11-10 DIAGNOSIS — S32411D Displaced fracture of anterior wall of right acetabulum, subsequent encounter for fracture with routine healing: Secondary | ICD-10-CM | POA: Diagnosis not present

## 2022-11-10 DIAGNOSIS — M5441 Lumbago with sciatica, right side: Secondary | ICD-10-CM | POA: Diagnosis not present

## 2022-11-10 DIAGNOSIS — E785 Hyperlipidemia, unspecified: Secondary | ICD-10-CM | POA: Diagnosis not present

## 2022-11-10 DIAGNOSIS — H3530 Unspecified macular degeneration: Secondary | ICD-10-CM | POA: Diagnosis not present

## 2022-11-10 DIAGNOSIS — R296 Repeated falls: Secondary | ICD-10-CM | POA: Diagnosis not present

## 2022-11-10 DIAGNOSIS — I872 Venous insufficiency (chronic) (peripheral): Secondary | ICD-10-CM | POA: Diagnosis not present

## 2022-11-10 DIAGNOSIS — Z9181 History of falling: Secondary | ICD-10-CM | POA: Diagnosis not present

## 2022-11-10 DIAGNOSIS — I2510 Atherosclerotic heart disease of native coronary artery without angina pectoris: Secondary | ICD-10-CM | POA: Diagnosis not present

## 2022-11-10 DIAGNOSIS — I10 Essential (primary) hypertension: Secondary | ICD-10-CM | POA: Diagnosis not present

## 2022-11-10 DIAGNOSIS — I890 Lymphedema, not elsewhere classified: Secondary | ICD-10-CM | POA: Diagnosis not present

## 2022-11-10 DIAGNOSIS — I739 Peripheral vascular disease, unspecified: Secondary | ICD-10-CM | POA: Diagnosis not present

## 2022-11-14 ENCOUNTER — Ambulatory Visit (INDEPENDENT_AMBULATORY_CARE_PROVIDER_SITE_OTHER): Payer: Medicare Other | Admitting: Family Medicine

## 2022-11-14 ENCOUNTER — Encounter: Payer: Self-pay | Admitting: Family Medicine

## 2022-11-14 VITALS — BP 141/64 | HR 53 | Temp 98.5°F | Wt 159.4 lb

## 2022-11-14 DIAGNOSIS — M545 Low back pain, unspecified: Secondary | ICD-10-CM

## 2022-11-14 DIAGNOSIS — I10 Essential (primary) hypertension: Secondary | ICD-10-CM | POA: Diagnosis not present

## 2022-11-14 DIAGNOSIS — I6529 Occlusion and stenosis of unspecified carotid artery: Secondary | ICD-10-CM

## 2022-11-14 DIAGNOSIS — Z0000 Encounter for general adult medical examination without abnormal findings: Secondary | ICD-10-CM | POA: Diagnosis not present

## 2022-11-14 DIAGNOSIS — R296 Repeated falls: Secondary | ICD-10-CM | POA: Diagnosis not present

## 2022-11-14 DIAGNOSIS — I25118 Atherosclerotic heart disease of native coronary artery with other forms of angina pectoris: Secondary | ICD-10-CM | POA: Diagnosis not present

## 2022-11-14 MED ORDER — ROSUVASTATIN CALCIUM 10 MG PO TABS: 10.0000 mg | ORAL_TABLET | Freq: Every day | ORAL | 0 refills | Status: DC

## 2022-11-14 MED ORDER — HYDROCHLOROTHIAZIDE 12.5 MG PO CAPS: 12.5000 mg | ORAL_CAPSULE | Freq: Every day | ORAL | 0 refills | Status: DC

## 2022-11-14 MED ORDER — HYDROXYZINE PAMOATE 25 MG PO CAPS: 25.0000 mg | ORAL_CAPSULE | Freq: Three times a day (TID) | ORAL | 1 refills | Status: DC | PRN

## 2022-11-14 MED ORDER — LISINOPRIL 5 MG PO TABS: 5.0000 mg | ORAL_TABLET | Freq: Every day | ORAL | 0 refills | Status: DC

## 2022-11-14 NOTE — Assessment & Plan Note (Signed)
Will keep BP and cholesterol under good control. Call with any concerns. Continue to follow with cardiology. Call with any concerns.  

## 2022-11-14 NOTE — Progress Notes (Signed)
BP (!) 141/64   Pulse (!) 53   Temp 98.5 F (36.9 C) (Oral)   Wt 159 lb 6.4 oz (72.3 kg)   SpO2 98%   BMI 23.54 kg/m    Subjective:    Patient ID: Nicholas Gross, male    DOB: 1927-09-18, 89 y.o.   MRN: [id number]  HPI: Nicholas Gross is a 89 y.o. male presenting on 11/14/2022 for comprehensive medical examination. Current medical complaints include:  Larey Seat about a month ago and had really bad bruising along his low back and into his L side. He notes that it was tender, but not as much as it was right after his fall. He notes that he is still having issues with falls. He has been doing PT, but hasn't had it for over a week. He is otherwise feeling well with no other concerns or complaints at this time.   He currently lives with: alone Interim Problems from his last visit: fall  Functional Status Survey: Is the patient deaf or have difficulty hearing?: No Does the patient have difficulty seeing, even when wearing glasses/contacts?: Yes Does the patient have difficulty concentrating, remembering, or making decisions?: Yes Does the patient have difficulty walking or climbing stairs?: Yes Does the patient have difficulty dressing or bathing?: No Does the patient have difficulty doing errands alone such as visiting a doctor's office or shopping?: Yes  FALL RISK:    11/14/2022   10:16 AM 10/02/2022    1:25 PM 07/22/2022    3:54 PM 07/02/2022    9:28 AM 06/10/2022    3:25 PM  Fall Risk   Falls in the past year? 1  1 1 1   Number falls in past yr: 1 1 1 1 1   Comment  several     Injury with Fall? 1  1 1 1   Risk for fall due to : History of fall(s)  History of fall(s) History of fall(s) Impaired mobility;Impaired balance/gait  Follow up Education provided  Falls evaluation completed Falls evaluation completed Falls evaluation completed    Depression Screen    11/14/2022   10:16 AM 10/02/2022    1:24 PM 07/22/2022    3:55 PM 07/07/2022    9:24 AM 07/02/2022    9:29 AM   Depression screen PHQ 2/9  Decreased Interest 3 2 1  0 0  Down, Depressed, Hopeless 0 0 0 0 0  PHQ - 2 Score 3 2 1  0 0  Altered sleeping 1 2 1  3   Tired, decreased energy 2 0 1  0  Change in appetite 3 0 0  0  Feeling bad or failure about yourself  2 0 0  0  Trouble concentrating 0 0 0  0  Moving slowly or fidgety/restless 3 2 1   0  Suicidal thoughts 0 0 0  0  PHQ-9 Score 14 6 4  3   Difficult doing work/chores Somewhat difficult Somewhat difficult Somewhat difficult  Somewhat difficult    Advanced Directives Does patient have a HCPOA?    yes Does patient have a living will or MOST form?  yes  Past Medical History:  Past Medical History:  Diagnosis Date   CAD (coronary artery disease)    History of GI bleed 2008   History of tobacco use    17 pack years, quit around 1970   Hyperlipidemia    Hypertension    Overweight     Surgical History:  Past Surgical History:  Procedure Laterality Date   CAROTID  ENDARTERECTOMY Left 2010   CATARACT EXTRACTION     cypher stent  09/12/02   s/p cypher stent mid-LAD   TENDON REPAIR  1991   finger and arm    Medications:  Current Outpatient Medications on File Prior to Visit  Medication Sig   cyanocobalamin (VITAMIN B12) 1000 MCG tablet Take 1,000 mcg by mouth daily.   gabapentin (NEURONTIN) 100 MG capsule 100 capsule AS NEEDED (route: oral)   Multiple Vitamins-Minerals (PRESERVISION AREDS PO) Take 1 tablet by mouth 2 (two) times daily.   No current facility-administered medications on file prior to visit.    Allergies:  Allergies  Allergen Reactions   Other     Patient states that he is allergic to something that they place in the IV before they work on you   Simvastatin Other (See Comments)    Myalgia     Social History:  Social History   Socioeconomic History   Marital status: Widowed    Spouse name: Not on file   Number of children: Not on file   Years of education: some college in [employer]    Highest education level:  High school graduate  Occupational History   Occupation: retired   Tobacco Use   Smoking status: Former    Packs/day: 1.00    Years: 17.00    Additional pack years: 0.00    Total pack years: 17.00    Types: Cigarettes    Quit date: 06/23/1968    Years since quitting: 54.4   Smokeless tobacco: Never  Vaping Use   Vaping Use: Never used  Substance and Sexual Activity   Alcohol use: No   Drug use: No   Sexual activity: Not Currently  Other Topics Concern   Not on file  Social History Narrative   Attends church, neighbor take him    Social Determinants of Health   Financial Resource Strain: Low Risk  (05/13/2021)   Overall Financial Resource Strain (CARDIA)    Difficulty of Paying Living Expenses: Not hard at all  Food Insecurity: No Food Insecurity (06/27/2022)   Hunger Vital Sign    Worried About Running Out of Food in the Last Year: Never true    Ran Out of Food in the Last Year: Never true  Transportation Needs: No Transportation Needs (06/27/2022)   PRAPARE - Administrator, Civil Service (Medical): No    Lack of Transportation (Non-Medical): No  Physical Activity: Unknown (05/13/2021)   Exercise Vital Sign    Days of Exercise per Week: 2 days    Minutes of Exercise per Session: Not on file  Stress: No Stress Concern Present (05/13/2021)   [employer] of Occupational Health - Occupational Stress Questionnaire    Feeling of Stress : Not at all  Social Connections: Moderately Isolated (07/07/2022)   Social Connection and Isolation Panel [NHANES]    Frequency of Communication with Friends and Family: More than three times a week    Frequency of Social Gatherings with Friends and Family: Twice a week    Attends Religious Services: More than 4 times per year    Active Member of [employer] or Organizations: No    Attends Banker Meetings: Never    Marital Status: Widowed  Intimate Partner Violence: Not At Risk (06/17/2022)   Humiliation, Afraid, Rape,  and Kick questionnaire    Fear of Current or Ex-Partner: No    Emotionally Abused: No    Physically Abused: No    Sexually Abused: No  Social History   Tobacco Use  Smoking Status Former   Packs/day: 1.00   Years: 17.00   Additional pack years: 0.00   Total pack years: 17.00   Types: Cigarettes   Quit date: 06/23/1968   Years since quitting: 54.4  Smokeless Tobacco Never   Social History   Substance and Sexual Activity  Alcohol Use No    Family History:  Family History  Problem Relation Age of Onset   Heart disease Father        possibly   Cancer Sister        breast   AAA (abdominal aortic aneurysm) Brother    Cancer Sister        lung    Past medical history, surgical history, medications, allergies, family history and social history reviewed with patient today and changes made to appropriate areas of the chart.   Review of Systems  Constitutional: Negative.   HENT: Negative.    Eyes: Negative.   Respiratory: Negative.    Cardiovascular: Negative.   Gastrointestinal: Negative.   Genitourinary: Negative.   Musculoskeletal:  Positive for back pain. Negative for falls, joint pain, myalgias and neck pain.  Skin: Negative.   Neurological: Negative.   Endo/Heme/Allergies: Negative.   Psychiatric/Behavioral: Negative.     All other ROS negative except what is listed above and in the HPI.      Objective:    BP (!) 141/64   Pulse (!) 53   Temp 98.5 F (36.9 C) (Oral)   Wt 159 lb 6.4 oz (72.3 kg)   SpO2 98%   BMI 23.54 kg/m   Wt Readings from Last 3 Encounters:  11/14/22 159 lb 6.4 oz (72.3 kg)  10/02/22 162 lb 6.4 oz (73.7 kg)  09/15/22 160 lb 12.8 oz (72.9 kg)    No results found.  Physical Exam Vitals and nursing note reviewed.  Constitutional:      General: He is not in acute distress.    Appearance: Normal appearance. He is not ill-appearing, toxic-appearing or diaphoretic.  HENT:     Head: Normocephalic and atraumatic.     Right Ear:  External ear normal.     Left Ear: External ear normal.     Nose: Nose normal.     Mouth/Throat:     Mouth: Mucous membranes are moist.     Pharynx: Oropharynx is clear.  Eyes:     General: No scleral icterus.       Right eye: No discharge.        Left eye: No discharge.     Extraocular Movements: Extraocular movements intact.     Conjunctiva/sclera: Conjunctivae normal.     Pupils: Pupils are equal, round, and reactive to light.  Cardiovascular:     Rate and Rhythm: Normal rate and regular rhythm.     Pulses: Normal pulses.     Heart sounds: Normal heart sounds. No murmur heard.    No friction rub. No gallop.  Pulmonary:     Effort: Pulmonary effort is normal. No respiratory distress.     Breath sounds: Normal breath sounds. No stridor. No wheezing, rhonchi or rales.  Chest:     Chest wall: No tenderness.  Musculoskeletal:        General: Deformity (L 1-2) present. Normal range of motion.     Cervical back: Normal range of motion and neck supple.  Skin:    General: Skin is warm and dry.     Capillary Refill: Capillary refill takes less  than 2 seconds.     Coloration: Skin is not jaundiced or pale.     Findings: Bruising (in low back) present. No erythema, lesion or rash.  Neurological:     General: No focal deficit present.     Mental Status: He is alert and oriented to person, place, and time. Mental status is at baseline.  Psychiatric:        Mood and Affect: Mood normal.        Behavior: Behavior normal.        Thought Content: Thought content normal.        Judgment: Judgment normal.        11/14/2022   10:18 AM 05/13/2021    9:58 AM 08/30/2018    9:42 AM 08/26/2017    9:38 AM 08/21/2016    2:11 PM  6CIT Screen  What Year? 0 points 0 points 0 points 0 points 0 points  What month? 0 points 0 points 0 points 0 points 0 points  What time? 0 points 0 points 0 points 0 points 0 points  Count back from 20 0 points 0 points 0 points 0 points 0 points  Months in reverse 0  points 2 points 0 points 0 points 0 points  Repeat phrase 6 points 2 points 2 points 4 points 2 points  Total Score 6 points 4 points 2 points 4 points 2 points    Results for orders placed or performed during the hospital encounter of 06/17/22  Basic metabolic panel  Result Value Ref Range   Sodium 131 (L) 135 - 145 mmol/L   Potassium 3.9 3.5 - 5.1 mmol/L   Chloride 97 (L) 98 - 111 mmol/L   CO2 25 22 - 32 mmol/L   Glucose, Bld 101 (H) 70 - 99 mg/dL   BUN 25 (H) 8 - 23 mg/dL   Creatinine, Ser 7.42 0.61 - 1.24 mg/dL   Calcium 9.1 8.9 - 59.5 mg/dL   GFR, Estimated >63 >87 mL/min   Anion gap 9 5 - 15  CBC  Result Value Ref Range   WBC 7.5 4.0 - 10.5 K/uL   RBC 3.67 (L) 4.22 - 5.81 MIL/uL   Hemoglobin 11.7 (L) 13.0 - 17.0 g/dL   HCT 56.4 (L) 33.2 - 95.1 %   MCV 93.7 80.0 - 100.0 fL   MCH 31.9 26.0 - 34.0 pg   MCHC 34.0 30.0 - 36.0 g/dL   RDW 88.4 16.6 - 06.3 %   Platelets 224 150 - 400 K/uL   nRBC 0.0 0.0 - 0.2 %  Urinalysis, Routine w reflex microscopic  Result Value Ref Range   Color, Urine YELLOW (A) YELLOW   APPearance CLEAR (A) CLEAR   Specific Gravity, Urine 1.017 1.005 - 1.030   pH 6.0 5.0 - 8.0   Glucose, UA NEGATIVE NEGATIVE mg/dL   Hgb urine dipstick NEGATIVE NEGATIVE   Bilirubin Urine NEGATIVE NEGATIVE   Ketones, ur NEGATIVE NEGATIVE mg/dL   Protein, ur NEGATIVE NEGATIVE mg/dL   Nitrite NEGATIVE NEGATIVE   Leukocytes,Ua NEGATIVE NEGATIVE   RBC / HPF 6-10 0 - 5 RBC/hpf   WBC, UA 0-5 0 - 5 WBC/hpf   Bacteria, UA NONE SEEN NONE SEEN   Squamous Epithelial / HPF NONE SEEN 0 - 5  Procalcitonin - Baseline  Result Value Ref Range   Procalcitonin <0.10 ng/mL  Vitamin B12  Result Value Ref Range   Vitamin B-12 605 180 - 914 pg/mL  CBC with Differential/Platelet  Result Value  Ref Range   WBC 6.9 4.0 - 10.5 K/uL   RBC 3.93 (L) 4.22 - 5.81 MIL/uL   Hemoglobin 12.4 (L) 13.0 - 17.0 g/dL   HCT 16.1 (L) 09.6 - 04.5 %   MCV 91.6 80.0 - 100.0 fL   MCH 31.6 26.0 - 34.0  pg   MCHC 34.4 30.0 - 36.0 g/dL   RDW 40.9 81.1 - 91.4 %   Platelets 244 150 - 400 K/uL   nRBC 0.0 0.0 - 0.2 %   Neutrophils Relative % 66 %   Neutro Abs 4.6 1.7 - 7.7 K/uL   Lymphocytes Relative 17 %   Lymphs Abs 1.2 0.7 - 4.0 K/uL   Monocytes Relative 9 %   Monocytes Absolute 0.7 0.1 - 1.0 K/uL   Eosinophils Relative 6 %   Eosinophils Absolute 0.4 0.0 - 0.5 K/uL   Basophils Relative 1 %   Basophils Absolute 0.1 0.0 - 0.1 K/uL   Immature Granulocytes 1 %   Abs Immature Granulocytes 0.04 0.00 - 0.07 K/uL  Basic metabolic panel  Result Value Ref Range   Sodium 134 (L) 135 - 145 mmol/L   Potassium 4.0 3.5 - 5.1 mmol/L   Chloride 101 98 - 111 mmol/L   CO2 23 22 - 32 mmol/L   Glucose, Bld 99 70 - 99 mg/dL   BUN 16 8 - 23 mg/dL   Creatinine, Ser 7.82 0.61 - 1.24 mg/dL   Calcium 9.0 8.9 - 95.6 mg/dL   GFR, Estimated >21 >30 mL/min   Anion gap 10 5 - 15  Troponin I (High Sensitivity)  Result Value Ref Range   Troponin I (High Sensitivity) 10 <18 ng/L      Assessment & Plan:   Problem List Items Addressed This Visit       Cardiovascular and Mediastinum   Hypertension    Doing well at home, slightly high here. Continue to monitor. Call with any concerns.       Relevant Medications   hydrochlorothiazide (MICROZIDE) 12.5 MG capsule   lisinopril (ZESTRIL) 5 MG tablet   rosuvastatin (CRESTOR) 10 MG tablet   Other Relevant Orders   Ambulatory referral to Home Health   CAD (coronary artery disease)    Will keep BP and cholesterol under good control. Call with any concerns. Continue to follow with cardiology. Call with any concerns.       Relevant Medications   hydrochlorothiazide (MICROZIDE) 12.5 MG capsule   lisinopril (ZESTRIL) 5 MG tablet   rosuvastatin (CRESTOR) 10 MG tablet   Other Relevant Orders   Ambulatory referral to Home Health   Carotid atherosclerosis    Will keep BP and cholesterol under good control. Call with any concerns. Continue to follow with  cardiology. Call with any concerns.       Relevant Medications   hydrochlorothiazide (MICROZIDE) 12.5 MG capsule   lisinopril (ZESTRIL) 5 MG tablet   rosuvastatin (CRESTOR) 10 MG tablet   Other Relevant Orders   Ambulatory referral to Home Health     Other   Multiple falls    Will get him set up for home health. Referral generated today. Call with any concerns.       Relevant Orders   Ambulatory referral to Home Health   Other Visit Diagnoses     Encounter for annual wellness exam in Medicare patient    -  Primary   Preventative care discussed today as below.   Acute midline low back pain without sciatica  Will get him x-ray. Await results. Continue lidocaine patches. Will get set up for PT at home. Call with any concerns.   Relevant Orders   DG Lumbar Spine Complete   Ambulatory referral to Home Health        Preventative Services:  Health Risk Assessment and Personalized Prevention Plan: Done today Bone Mass Measurements: N/A CVD Screening: up to date Colon Cancer Screening: N/A Depression Screening: Done today Diabetes Screening: Up to date Glaucoma Screening: See your eye doctor Hepatitis B vaccine: N/A Hepatitis C screening: up to date HIV Screening: up to date Flu Vaccine: Declined Lung cancer Screening: N/A Obesity Screening: Done today Pneumonia Vaccines (2): Declined STI Screening: N/A PSA screening: N/A  Discussed aspirin prophylaxis for myocardial infarction prevention and decision was made to continue ASA  LABORATORY TESTING:  Health maintenance labs ordered today as discussed above.   IMMUNIZATIONS:   - Tdap: Tetanus vaccination status reviewed: last tetanus booster within 10 years. - Influenza: Up to date - Pneumovax: Refused - Prevnar: Refused - Zostavax vaccine: Refused  SCREENING: - Colonoscopy: Not applicable  Discussed with patient purpose of the colonoscopy is to detect colon cancer at curable precancerous or early stages    PATIENT COUNSELING:    Sexuality: Discussed sexually transmitted diseases, partner selection, use of condoms, avoidance of unintended pregnancy  and contraceptive alternatives.   Advised to avoid cigarette smoking.  I discussed with the patient that most people either abstain from alcohol or drink within safe limits (<=14/week and <=4 drinks/occasion for males, <=7/weeks and <= 3 drinks/occasion for females) and that the risk for alcohol disorders and other health effects rises proportionally with the number of drinks per week and how often a drinker exceeds daily limits.  Discussed cessation/primary prevention of drug use and availability of treatment for abuse.   Diet: Encouraged to adjust caloric intake to maintain  or achieve ideal body weight, to reduce intake of dietary saturated fat and total fat, to limit sodium intake by avoiding high sodium foods and not adding table salt, and to maintain adequate dietary potassium and calcium preferably from fresh fruits, vegetables, and low-fat dairy products.    stressed the importance of regular exercise  Injury prevention: Discussed safety belts, safety helmets, smoke detector, smoking near bedding or upholstery.   Dental health: Discussed importance of regular tooth brushing, flossing, and dental visits.   Follow up plan: NEXT PREVENTATIVE PHYSICAL DUE IN 1 YEAR. Return in about 3 months (around 02/14/2023).

## 2022-11-14 NOTE — Assessment & Plan Note (Signed)
Will keep BP and cholesterol under good control. Call with any concerns. Continue to follow with cardiology. Call with any concerns.

## 2022-11-14 NOTE — Patient Instructions (Signed)
  Preventative Services:  Health Risk Assessment and Personalized Prevention Plan: Done today Bone Mass Measurements: N/A CVD Screening: up to date Colon Cancer Screening: N/A Depression Screening: Done today Diabetes Screening: Up to date Glaucoma Screening: See your eye doctor Hepatitis B vaccine: N/A Hepatitis C screening: up to date HIV Screening: up to date Flu Vaccine: Declined Lung cancer Screening: N/A Obesity Screening: Done today Pneumonia Vaccines (2): Declined STI Screening: N/A PSA screening: N/A

## 2022-11-14 NOTE — Assessment & Plan Note (Signed)
Will get him set up for home health. Referral generated today. Call with any concerns.

## 2022-11-14 NOTE — Assessment & Plan Note (Signed)
Doing well at home, slightly high here. Continue to monitor. Call with any concerns.

## 2022-11-18 ENCOUNTER — Ambulatory Visit
Admission: RE | Admit: 2022-11-18 | Discharge: 2022-11-18 | Disposition: A | Discharge status: Home / Self Care | Payer: Medicare Other | Attending: Family Medicine | Admitting: Family Medicine

## 2022-11-18 ENCOUNTER — Ambulatory Visit
Admission: RE | Admit: 2022-11-18 | Discharge: 2022-11-18 | Disposition: A | Discharge status: Home / Self Care | Payer: Medicare Other | Source: Ambulatory Visit | Attending: Family Medicine | Admitting: Family Medicine

## 2022-11-18 DIAGNOSIS — Z9181 History of falling: Secondary | ICD-10-CM | POA: Diagnosis not present

## 2022-11-18 DIAGNOSIS — E785 Hyperlipidemia, unspecified: Secondary | ICD-10-CM | POA: Diagnosis not present

## 2022-11-18 DIAGNOSIS — I739 Peripheral vascular disease, unspecified: Secondary | ICD-10-CM | POA: Diagnosis not present

## 2022-11-18 DIAGNOSIS — I872 Venous insufficiency (chronic) (peripheral): Secondary | ICD-10-CM | POA: Diagnosis not present

## 2022-11-18 DIAGNOSIS — M5441 Lumbago with sciatica, right side: Secondary | ICD-10-CM | POA: Diagnosis not present

## 2022-11-18 DIAGNOSIS — I10 Essential (primary) hypertension: Secondary | ICD-10-CM | POA: Diagnosis not present

## 2022-11-18 DIAGNOSIS — I2510 Atherosclerotic heart disease of native coronary artery without angina pectoris: Secondary | ICD-10-CM | POA: Diagnosis not present

## 2022-11-18 DIAGNOSIS — M545 Low back pain, unspecified: Secondary | ICD-10-CM | POA: Diagnosis not present

## 2022-11-18 DIAGNOSIS — H3530 Unspecified macular degeneration: Secondary | ICD-10-CM | POA: Diagnosis not present

## 2022-11-18 DIAGNOSIS — I890 Lymphedema, not elsewhere classified: Secondary | ICD-10-CM | POA: Diagnosis not present

## 2022-11-18 DIAGNOSIS — R296 Repeated falls: Secondary | ICD-10-CM | POA: Diagnosis not present

## 2022-11-18 DIAGNOSIS — S32411D Displaced fracture of anterior wall of right acetabulum, subsequent encounter for fracture with routine healing: Secondary | ICD-10-CM | POA: Diagnosis not present

## 2022-11-24 ENCOUNTER — Ambulatory Visit: Payer: Self-pay | Admitting: *Deleted

## 2022-11-24 NOTE — Telephone Encounter (Signed)
     Chief Complaint: Has new blisters to both feet. Red, draining. "Has cellulitis a lot." No availability. Asking to be worked in or have antibiotic called in. Symptoms: Above Frequency: Friday Pertinent Negatives: Patient denies fever or pain Disposition: [] ED /[] Urgent Care (no appt availability in office) / [] Appointment(In office/virtual)/ []  [facility]/ [] Home Care/ [] Refused Recommended Disposition /[] [facility]/ [x]  Follow-up with PCP Additional Notes: Please advise [name].  Answer Assessment - Initial Assessment Questions 1. LOCATION: "Where is the wound located?"      Both feet 2. WOUND APPEARANCE: "What does the wound look like?"      Looks like a blisters that has popped 3. SIZE: If redness is present, ask: "What is the size of the red area?" (Inches, centimeters, or compare to size of a coin)      Yes 4. SPREAD: "What's changed in the last day?"  "Do you see any red streaks coming from the wound?"     Yes 5. ONSET: "When did it start to look infected?"      Friday 6. MECHANISM: "How did the wound start, what was the cause?"     Unsure 7. PAIN: "Is there any pain?" If Yes, ask: "How bad is the pain?"   (Scale 1-10; or mild, moderate, severe)     None 8. FEVER: "Do you have a fever?" If Yes, ask: "What is your temperature, how was it measured, and when did it start?"     No 9. OTHER SYMPTOMS: "Do you have any other symptoms?" (e.g., shaking chills, weakness, rash elsewhere on body)     No 10. PREGNANCY: "Is there any chance you are pregnant?" "When was your last menstrual period?"       N/a  Protocols used: Wound Infection-A-AH

## 2022-11-24 NOTE — Telephone Encounter (Signed)
Summary: pt has open wounds that are oozing   Pt niece Nicholas Gross reports that pt has open wounds that are oozing and she would like a nurse to return her call. Cb# [phone]         Attempted to call Nicholas Gross- no answer- left message to call office

## 2022-11-24 NOTE — Telephone Encounter (Signed)
Appointment has been made

## 2022-11-24 NOTE — Telephone Encounter (Signed)
I can see him tomorrow

## 2022-11-25 ENCOUNTER — Ambulatory Visit (INDEPENDENT_AMBULATORY_CARE_PROVIDER_SITE_OTHER): Payer: Medicare Other | Admitting: Family Medicine

## 2022-11-25 ENCOUNTER — Ambulatory Visit
Admission: RE | Admit: 2022-11-25 | Discharge: 2022-11-25 | Disposition: A | Discharge status: Home / Self Care | Payer: Medicare Other | Source: Ambulatory Visit | Attending: Family Medicine | Admitting: Family Medicine

## 2022-11-25 ENCOUNTER — Encounter: Payer: Self-pay | Admitting: Family Medicine

## 2022-11-25 ENCOUNTER — Ambulatory Visit
Admission: RE | Admit: 2022-11-25 | Discharge: 2022-11-25 | Disposition: A | Discharge status: Home / Self Care | Payer: Medicare Other | Attending: Family Medicine | Admitting: Family Medicine

## 2022-11-25 VITALS — BP 134/69 | HR 57 | Ht 69.0 in | Wt 158.8 lb

## 2022-11-25 DIAGNOSIS — R21 Rash and other nonspecific skin eruption: Secondary | ICD-10-CM

## 2022-11-25 DIAGNOSIS — R0781 Pleurodynia: Secondary | ICD-10-CM

## 2022-11-25 DIAGNOSIS — R600 Localized edema: Secondary | ICD-10-CM | POA: Diagnosis not present

## 2022-11-25 MED ORDER — FUROSEMIDE 20 MG PO TABS: 20.0000 mg | ORAL_TABLET | Freq: Every day | ORAL | 3 refills | Status: DC

## 2022-11-25 MED ORDER — TRIAMCINOLONE ACETONIDE 40 MG/ML IJ SUSP
40.0000 mg | Freq: Once | INTRAMUSCULAR | Status: AC
  Administered 2022-11-25: 40 mg via INTRAMUSCULAR

## 2022-11-25 MED ORDER — PREDNISONE 20 MG PO TABS: 20.0000 mg | ORAL_TABLET | Freq: Every day | ORAL | 0 refills | Status: DC

## 2022-11-25 NOTE — Progress Notes (Signed)
BP 134/69 (BP Location: Left Arm, Cuff Size: Normal)   Pulse (!) 57   Ht 5\' 9"  (1.753 m)   Wt 158 lb 12.8 oz (72 kg)   SpO2 90%   BMI 23.45 kg/m    Subjective:    Patient ID: Nicholas Gross, male    DOB: Feb 12, 1928, 89 y.o.   MRN: [id number]  HPI: Nicholas Gross is a 89 y.o. male  Chief Complaint  Patient presents with   Cellulitis    Patient feet are infected again. Patient says since his last visit, patient has fell again and may have injury in rib area. Patient says has bruising and soreness in his rib area. Patient would like to discuss his x-ray results.    Rash    Patient says he feels bump and is itchy all over.   ???SKIN INFECTION Duration: couple of days Location: lower legs History of trauma in area: no Pain: no Quality: N/A Severity: mild Redness: yes Swelling: yes Oozing: yes Pus: no Fevers: no Nausea/vomiting: no Status: worse Treatments attempted:none  Tetanus: UTD   RASH Duration:  days  Location: generalized  Itching: yes Burning: no Redness: no Oozing: no Scaling: no Blisters: no Painful: no Fevers: no Change in detergents/soaps/personal care products: no Recent illness: no Recent travel:no History of same: yes Context: worse Alleviating factors: nothing Treatments attempted:nothing Shortness of breath: no  Throat/tongue swelling: no Myalgias/arthralgias: no  Fell in the last week and hurt his ribs on his L side   Relevant past medical, surgical, family and social history reviewed and updated as indicated. Interim medical history since our last visit reviewed. Allergies and medications reviewed and updated.  Review of Systems  Constitutional: Negative.   Respiratory: Negative.    Cardiovascular:  Positive for leg swelling. Negative for chest pain and palpitations.  Gastrointestinal: Negative.   Musculoskeletal: Negative.   Skin:  Positive for rash. Negative for color change, pallor and wound.  Psychiatric/Behavioral:  Negative.      Per HPI unless specifically indicated above     Objective:    BP 134/69 (BP Location: Left Arm, Cuff Size: Normal)   Pulse (!) 57   Ht 5\' 9"  (1.753 m)   Wt 158 lb 12.8 oz (72 kg)   SpO2 90%   BMI 23.45 kg/m   Wt Readings from Last 3 Encounters:  11/25/22 158 lb 12.8 oz (72 kg)  11/14/22 159 lb 6.4 oz (72.3 kg)  10/02/22 162 lb 6.4 oz (73.7 kg)    Physical Exam Vitals and nursing note reviewed.  Constitutional:      General: He is not in acute distress.    Appearance: Normal appearance. He is not ill-appearing, toxic-appearing or diaphoretic.  HENT:     Head: Normocephalic and atraumatic.     Right Ear: External ear normal.     Left Ear: External ear normal.     Nose: Nose normal.     Mouth/Throat:     Mouth: Mucous membranes are moist.     Pharynx: Oropharynx is clear.  Eyes:     General: No scleral icterus.       Right eye: No discharge.        Left eye: No discharge.     Extraocular Movements: Extraocular movements intact.     Conjunctiva/sclera: Conjunctivae normal.     Pupils: Pupils are equal, round, and reactive to light.  Cardiovascular:     Rate and Rhythm: Normal rate and regular rhythm.  Pulses: Normal pulses.     Heart sounds: Normal heart sounds. No murmur heard.    No friction rub. No gallop.  Pulmonary:     Effort: Pulmonary effort is normal. No respiratory distress.     Breath sounds: Normal breath sounds. No stridor. No wheezing, rhonchi or rales.  Chest:     Chest wall: No tenderness.  Musculoskeletal:        General: Normal range of motion.     Cervical back: Normal range of motion and neck supple.     Right lower leg: Edema (1+) present.     Left lower leg: Edema (1+) present.  Skin:    General: Skin is warm and dry.     Capillary Refill: Capillary refill takes less than 2 seconds.     Coloration: Skin is not jaundiced or pale.     Findings: Erythema (bilateral legs without heat, blisters on R foot) and rash (papular  generalized) present. No bruising or lesion.  Neurological:     General: No focal deficit present.     Mental Status: He is alert and oriented to person, place, and time. Mental status is at baseline.  Psychiatric:        Mood and Affect: Mood normal.        Behavior: Behavior normal.        Thought Content: Thought content normal.        Judgment: Judgment normal.     Results for orders placed or performed during the hospital encounter of 06/17/22  Basic metabolic panel  Result Value Ref Range   Sodium 131 (L) 135 - 145 mmol/L   Potassium 3.9 3.5 - 5.1 mmol/L   Chloride 97 (L) 98 - 111 mmol/L   CO2 25 22 - 32 mmol/L   Glucose, Bld 101 (H) 70 - 99 mg/dL   BUN 25 (H) 8 - 23 mg/dL   Creatinine, Ser 4.13 0.61 - 1.24 mg/dL   Calcium 9.1 8.9 - 24.4 mg/dL   GFR, Estimated >01 >02 mL/min   Anion gap 9 5 - 15  CBC  Result Value Ref Range   WBC 7.5 4.0 - 10.5 K/uL   RBC 3.67 (L) 4.22 - 5.81 MIL/uL   Hemoglobin 11.7 (L) 13.0 - 17.0 g/dL   HCT 72.5 (L) 36.6 - 44.0 %   MCV 93.7 80.0 - 100.0 fL   MCH 31.9 26.0 - 34.0 pg   MCHC 34.0 30.0 - 36.0 g/dL   RDW 34.7 42.5 - 95.6 %   Platelets 224 150 - 400 K/uL   nRBC 0.0 0.0 - 0.2 %  Urinalysis, Routine w reflex microscopic  Result Value Ref Range   Color, Urine YELLOW (A) YELLOW   APPearance CLEAR (A) CLEAR   Specific Gravity, Urine 1.017 1.005 - 1.030   pH 6.0 5.0 - 8.0   Glucose, UA NEGATIVE NEGATIVE mg/dL   Hgb urine dipstick NEGATIVE NEGATIVE   Bilirubin Urine NEGATIVE NEGATIVE   Ketones, ur NEGATIVE NEGATIVE mg/dL   Protein, ur NEGATIVE NEGATIVE mg/dL   Nitrite NEGATIVE NEGATIVE   Leukocytes,Ua NEGATIVE NEGATIVE   RBC / HPF 6-10 0 - 5 RBC/hpf   WBC, UA 0-5 0 - 5 WBC/hpf   Bacteria, UA NONE SEEN NONE SEEN   Squamous Epithelial / HPF NONE SEEN 0 - 5  Procalcitonin - Baseline  Result Value Ref Range   Procalcitonin <0.10 ng/mL  Vitamin B12  Result Value Ref Range   Vitamin B-12 605 180 - 914 pg/mL  CBC with  Differential/Platelet  Result Value Ref Range   WBC 6.9 4.0 - 10.5 K/uL   RBC 3.93 (L) 4.22 - 5.81 MIL/uL   Hemoglobin 12.4 (L) 13.0 - 17.0 g/dL   HCT 16.1 (L) 09.6 - 04.5 %   MCV 91.6 80.0 - 100.0 fL   MCH 31.6 26.0 - 34.0 pg   MCHC 34.4 30.0 - 36.0 g/dL   RDW 40.9 81.1 - 91.4 %   Platelets 244 150 - 400 K/uL   nRBC 0.0 0.0 - 0.2 %   Neutrophils Relative % 66 %   Neutro Abs 4.6 1.7 - 7.7 K/uL   Lymphocytes Relative 17 %   Lymphs Abs 1.2 0.7 - 4.0 K/uL   Monocytes Relative 9 %   Monocytes Absolute 0.7 0.1 - 1.0 K/uL   Eosinophils Relative 6 %   Eosinophils Absolute 0.4 0.0 - 0.5 K/uL   Basophils Relative 1 %   Basophils Absolute 0.1 0.0 - 0.1 K/uL   Immature Granulocytes 1 %   Abs Immature Granulocytes 0.04 0.00 - 0.07 K/uL  Basic metabolic panel  Result Value Ref Range   Sodium 134 (L) 135 - 145 mmol/L   Potassium 4.0 3.5 - 5.1 mmol/L   Chloride 101 98 - 111 mmol/L   CO2 23 22 - 32 mmol/L   Glucose, Bld 99 70 - 99 mg/dL   BUN 16 8 - 23 mg/dL   Creatinine, Ser 7.82 0.61 - 1.24 mg/dL   Calcium 9.0 8.9 - 95.6 mg/dL   GFR, Estimated >21 >30 mL/min   Anion gap 10 5 - 15  Troponin I (High Sensitivity)  Result Value Ref Range   Troponin I (High Sensitivity) 10 <18 ng/L      Assessment & Plan:   Problem List Items Addressed This Visit   None Visit Diagnoses     Peripheral edema    -  Primary   Does not appear to be infected at this time. Will treat with lasix and recheck on Friday. Call with any concerns. Continue to monitor.   Rash       Will treat with triamcinalone followed by prednisone. Call with any concerns.   Relevant Medications   triamcinolone acetonide (KENALOG-40) injection 40 mg (Completed)   Rib pain       Will check x-ray. Await results. Treat as needed.   Relevant Orders   DG Ribs Unilateral Left        Follow up plan: Return Friday with Clydie Braun.

## 2022-11-27 NOTE — Progress Notes (Signed)
BP (!) 169/70   Pulse 61   Temp 98 F (36.7 C) (Oral)   Wt 158 lb (71.7 kg)   SpO2 99%   BMI 23.33 kg/m    Subjective:    Patient ID: Nicholas Gross, male    DOB: February 16, 1928, 89 y.o.   MRN: [id number]  HPI: Nicholas Gross is a 89 y.o. male  Chief Complaint  Patient presents with   foot wound    Pt is needing refills as well.   SKIN INFECTION No swelling in left leg at visit today.  Minimal swelling in right lower leg.  No blisters on left leg.  Blisters with Oozing on right foot.  Foot is warm to the touch with redness up half of lower leg. Duration: couple of days Location: lower legs History of trauma in area: no Pain: no Quality: N/A Severity: mild Redness: yes Swelling: yes Oozing: yes Pus: no Fevers: no Nausea/vomiting: no Status: worse Treatments attempted:none  Tetanus: UTD   RASH Duration:  days  Location: generalized  Itching: yes Burning: no Redness: no Oozing: no Scaling: no Blisters: no Painful: no Fevers: no Change in detergents/soaps/personal care products: no Recent illness: no Recent travel:no History of same: yes Context: worse Alleviating factors: nothing Treatments attempted:nothing Shortness of breath: no  Throat/tongue swelling: no Myalgias/arthralgias: no     Relevant past medical, surgical, family and social history reviewed and updated as indicated. Interim medical history since our last visit reviewed. Allergies and medications reviewed and updated.  Review of Systems  Constitutional: Negative.   Respiratory: Negative.    Cardiovascular:  Positive for leg swelling. Negative for chest pain and palpitations.  Gastrointestinal: Negative.   Musculoskeletal: Negative.   Skin:  Positive for rash. Negative for color change, pallor and wound.  Psychiatric/Behavioral: Negative.      Per HPI unless specifically indicated above     Objective:    BP (!) 169/70   Pulse 61   Temp 98 F (36.7 C) (Oral)   Wt 158  lb (71.7 kg)   SpO2 99%   BMI 23.33 kg/m   Wt Readings from Last 3 Encounters:  11/28/22 158 lb (71.7 kg)  11/25/22 158 lb 12.8 oz (72 kg)  11/14/22 159 lb 6.4 oz (72.3 kg)    Physical Exam Vitals and nursing note reviewed.  Constitutional:      General: He is not in acute distress.    Appearance: Normal appearance. He is not ill-appearing, toxic-appearing or diaphoretic.  HENT:     Head: Normocephalic and atraumatic.     Right Ear: External ear normal.     Left Ear: External ear normal.     Nose: Nose normal.     Mouth/Throat:     Mouth: Mucous membranes are moist.     Pharynx: Oropharynx is clear.  Eyes:     General: No scleral icterus.       Right eye: No discharge.        Left eye: No discharge.     Extraocular Movements: Extraocular movements intact.     Conjunctiva/sclera: Conjunctivae normal.     Pupils: Pupils are equal, round, and reactive to light.  Cardiovascular:     Rate and Rhythm: Normal rate and regular rhythm.     Pulses: Normal pulses.     Heart sounds: Normal heart sounds. No murmur heard.    No friction rub. No gallop.  Pulmonary:     Effort: Pulmonary effort is normal. No respiratory distress.  Breath sounds: Normal breath sounds. No stridor. No wheezing, rhonchi or rales.  Chest:     Chest wall: No tenderness.  Musculoskeletal:        General: Normal range of motion.     Cervical back: Normal range of motion and neck supple.     Right lower leg: Edema (1+) present.     Left lower leg: Edema (1+) present.  Skin:    General: Skin is warm and dry.     Capillary Refill: Capillary refill takes less than 2 seconds.     Coloration: Skin is not jaundiced or pale.     Findings: Erythema (bilateral legs without heat, blisters on R foot) present. No bruising or lesion. Rash: papular generalized.    Comments: No swelling in left leg at visit today.  Minimal swelling in right lower leg.  No blisters on left leg.  Blisters with Oozing on right foot.  Foot  is warm to the touch with redness up half of lower leg.  Neurological:     General: No focal deficit present.     Mental Status: He is alert and oriented to person, place, and time. Mental status is at baseline.  Psychiatric:        Mood and Affect: Mood normal.        Behavior: Behavior normal.        Thought Content: Thought content normal.        Judgment: Judgment normal.     Results for orders placed or performed during the hospital encounter of 06/17/22  Basic metabolic panel  Result Value Ref Range   Sodium 131 (L) 135 - 145 mmol/L   Potassium 3.9 3.5 - 5.1 mmol/L   Chloride 97 (L) 98 - 111 mmol/L   CO2 25 22 - 32 mmol/L   Glucose, Bld 101 (H) 70 - 99 mg/dL   BUN 25 (H) 8 - 23 mg/dL   Creatinine, Ser 6.96 0.61 - 1.24 mg/dL   Calcium 9.1 8.9 - 29.5 mg/dL   GFR, Estimated >28 >41 mL/min   Anion gap 9 5 - 15  CBC  Result Value Ref Range   WBC 7.5 4.0 - 10.5 K/uL   RBC 3.67 (L) 4.22 - 5.81 MIL/uL   Hemoglobin 11.7 (L) 13.0 - 17.0 g/dL   HCT 32.4 (L) 40.1 - 02.7 %   MCV 93.7 80.0 - 100.0 fL   MCH 31.9 26.0 - 34.0 pg   MCHC 34.0 30.0 - 36.0 g/dL   RDW 25.3 66.4 - 40.3 %   Platelets 224 150 - 400 K/uL   nRBC 0.0 0.0 - 0.2 %  Urinalysis, Routine w reflex microscopic  Result Value Ref Range   Color, Urine YELLOW (A) YELLOW   APPearance CLEAR (A) CLEAR   Specific Gravity, Urine 1.017 1.005 - 1.030   pH 6.0 5.0 - 8.0   Glucose, UA NEGATIVE NEGATIVE mg/dL   Hgb urine dipstick NEGATIVE NEGATIVE   Bilirubin Urine NEGATIVE NEGATIVE   Ketones, ur NEGATIVE NEGATIVE mg/dL   Protein, ur NEGATIVE NEGATIVE mg/dL   Nitrite NEGATIVE NEGATIVE   Leukocytes,Ua NEGATIVE NEGATIVE   RBC / HPF 6-10 0 - 5 RBC/hpf   WBC, UA 0-5 0 - 5 WBC/hpf   Bacteria, UA NONE SEEN NONE SEEN   Squamous Epithelial / HPF NONE SEEN 0 - 5  Procalcitonin - Baseline  Result Value Ref Range   Procalcitonin <0.10 ng/mL  Vitamin B12  Result Value Ref Range   Vitamin B-12 605  180 - 914 pg/mL  CBC with  Differential/Platelet  Result Value Ref Range   WBC 6.9 4.0 - 10.5 K/uL   RBC 3.93 (L) 4.22 - 5.81 MIL/uL   Hemoglobin 12.4 (L) 13.0 - 17.0 g/dL   HCT 81.1 (L) 91.4 - 78.2 %   MCV 91.6 80.0 - 100.0 fL   MCH 31.6 26.0 - 34.0 pg   MCHC 34.4 30.0 - 36.0 g/dL   RDW 95.6 21.3 - 08.6 %   Platelets 244 150 - 400 K/uL   nRBC 0.0 0.0 - 0.2 %   Neutrophils Relative % 66 %   Neutro Abs 4.6 1.7 - 7.7 K/uL   Lymphocytes Relative 17 %   Lymphs Abs 1.2 0.7 - 4.0 K/uL   Monocytes Relative 9 %   Monocytes Absolute 0.7 0.1 - 1.0 K/uL   Eosinophils Relative 6 %   Eosinophils Absolute 0.4 0.0 - 0.5 K/uL   Basophils Relative 1 %   Basophils Absolute 0.1 0.0 - 0.1 K/uL   Immature Granulocytes 1 %   Abs Immature Granulocytes 0.04 0.00 - 0.07 K/uL  Basic metabolic panel  Result Value Ref Range   Sodium 134 (L) 135 - 145 mmol/L   Potassium 4.0 3.5 - 5.1 mmol/L   Chloride 101 98 - 111 mmol/L   CO2 23 22 - 32 mmol/L   Glucose, Bld 99 70 - 99 mg/dL   BUN 16 8 - 23 mg/dL   Creatinine, Ser 5.78 0.61 - 1.24 mg/dL   Calcium 9.0 8.9 - 46.9 mg/dL   GFR, Estimated >62 >95 mL/min   Anion gap 10 5 - 15  Troponin I (High Sensitivity)  Result Value Ref Range   Troponin I (High Sensitivity) 10 <18 ng/L      Assessment & Plan:   Problem List Items Addressed This Visit   None Visit Diagnoses     Cellulitis of right foot    -  Primary   Trace edema, warmth, redness and oozing.  Will treat wiht Bactrim.  Follow up in 4 days.  Call sooner if concerns arise.        Follow up plan: Return in about 4 days (around 12/02/2022) for Foot check.

## 2022-11-28 ENCOUNTER — Encounter: Payer: Self-pay | Admitting: Nurse Practitioner

## 2022-11-28 ENCOUNTER — Ambulatory Visit (INDEPENDENT_AMBULATORY_CARE_PROVIDER_SITE_OTHER): Payer: Medicare Other | Admitting: Nurse Practitioner

## 2022-11-28 VITALS — BP 169/70 | HR 61 | Temp 98.0°F | Wt 158.0 lb

## 2022-11-28 DIAGNOSIS — L03115 Cellulitis of right lower limb: Secondary | ICD-10-CM

## 2022-11-28 MED ORDER — GABAPENTIN 100 MG PO CAPS: ORAL_CAPSULE | ORAL | 1 refills | Status: DC

## 2022-11-28 MED ORDER — SULFAMETHOXAZOLE-TRIMETHOPRIM 800-160 MG PO TABS: 1.0000 | ORAL_TABLET | Freq: Two times a day (BID) | ORAL | 0 refills | Status: DC

## 2022-11-28 MED ORDER — HYDROCHLOROTHIAZIDE 12.5 MG PO CAPS: 12.5000 mg | ORAL_CAPSULE | Freq: Every day | ORAL | 1 refills | Status: DC

## 2022-12-02 ENCOUNTER — Encounter: Payer: Self-pay | Admitting: Family Medicine

## 2022-12-02 ENCOUNTER — Ambulatory Visit (INDEPENDENT_AMBULATORY_CARE_PROVIDER_SITE_OTHER): Payer: Medicare Other | Admitting: Family Medicine

## 2022-12-02 VITALS — BP 129/69 | HR 60 | Temp 98.2°F | Ht 69.0 in | Wt 161.4 lb

## 2022-12-02 DIAGNOSIS — L03115 Cellulitis of right lower limb: Secondary | ICD-10-CM | POA: Diagnosis not present

## 2022-12-02 DIAGNOSIS — R21 Rash and other nonspecific skin eruption: Secondary | ICD-10-CM

## 2022-12-02 MED ORDER — TRIAMCINOLONE ACETONIDE 0.5 % EX OINT: 1.0000 | TOPICAL_OINTMENT | Freq: Two times a day (BID) | CUTANEOUS | 0 refills | Status: DC

## 2022-12-02 NOTE — Progress Notes (Signed)
BP 129/69   Pulse 60   Temp 98.2 F (36.8 C) (Oral)   Ht 5\' 9"  (1.753 m)   Wt 161 lb 6.4 oz (73.2 kg)   SpO2 100%   BMI 23.83 kg/m    Subjective:    Patient ID: Nicholas Gross, male    DOB: 1928-02-02, 89 y.o.   MRN: [id number]  HPI: Nicholas Gross is a 89 y.o. male  Chief Complaint  Patient presents with   Cellulitis    Patient niece says she thinks the cellulitis has spread to patient's R hand. Patient says the foot is still blistered up.    SKIN INFECTION Duration: about a week Location: R leg History of trauma in area: no Pain: no Quality: tight Severity: mild Redness: yes Swelling: yes Oozing: no Pus: no Fevers: no Nausea/vomiting: no Status: better Treatments attempted:antibiotics  Tetanus: UTD  RASH Duration:  couple of days  Location: L hand Itching: yes Burning: yes Redness: yes Oozing: no Scaling: yes Blisters: no Painful: no Fevers: no Change in detergents/soaps/personal care products: no Recent illness: yes Recent travel:no History of same: yes Context: stable Alleviating factors: nothing Treatments attempted:nothing Shortness of breath: no  Throat/tongue swelling: no Myalgias/arthralgias: no   Relevant past medical, surgical, family and social history reviewed and updated as indicated. Interim medical history since our last visit reviewed. Allergies and medications reviewed and updated.  Review of Systems  Constitutional: Negative.   Respiratory: Negative.    Cardiovascular:  Positive for leg swelling. Negative for chest pain and palpitations.  Gastrointestinal: Negative.   Musculoskeletal: Negative.   Skin:  Positive for color change. Negative for pallor, rash and wound.  Neurological: Negative.   Psychiatric/Behavioral: Negative.      Per HPI unless specifically indicated above     Objective:    BP 129/69   Pulse 60   Temp 98.2 F (36.8 C) (Oral)   Ht 5\' 9"  (1.753 m)   Wt 161 lb 6.4 oz (73.2 kg)   SpO2 100%    BMI 23.83 kg/m   Wt Readings from Last 3 Encounters:  12/02/22 161 lb 6.4 oz (73.2 kg)  11/28/22 158 lb (71.7 kg)  11/25/22 158 lb 12.8 oz (72 kg)    Physical Exam Vitals and nursing note reviewed.  Constitutional:      General: He is not in acute distress.    Appearance: Normal appearance. He is not ill-appearing, toxic-appearing or diaphoretic.  HENT:     Head: Normocephalic and atraumatic.     Right Ear: External ear normal.     Left Ear: External ear normal.     Nose: Nose normal.     Mouth/Throat:     Mouth: Mucous membranes are moist.     Pharynx: Oropharynx is clear.  Eyes:     General: No scleral icterus.       Right eye: No discharge.        Left eye: No discharge.     Extraocular Movements: Extraocular movements intact.     Conjunctiva/sclera: Conjunctivae normal.     Pupils: Pupils are equal, round, and reactive to light.  Cardiovascular:     Rate and Rhythm: Normal rate and regular rhythm.     Pulses: Normal pulses.     Heart sounds: Murmur heard.     No friction rub. No gallop.  Pulmonary:     Effort: Pulmonary effort is normal. No respiratory distress.     Breath sounds: Normal breath sounds. No stridor. No  wheezing, rhonchi or rales.  Chest:     Chest wall: No tenderness.  Musculoskeletal:        General: Normal range of motion.     Cervical back: Normal range of motion and neck supple.     Right lower leg: Edema present.     Left lower leg: Edema present.  Skin:    General: Skin is warm and dry.     Capillary Refill: Capillary refill takes less than 2 seconds.     Coloration: Skin is not jaundiced or pale.     Findings: Erythema and rash (L hand) present. No bruising or lesion.  Neurological:     General: No focal deficit present.     Mental Status: He is alert and oriented to person, place, and time. Mental status is at baseline.  Psychiatric:        Mood and Affect: Mood normal.        Behavior: Behavior normal.        Thought Content: Thought  content normal.        Judgment: Judgment normal.     Results for orders placed or performed during the hospital encounter of 06/17/22  Basic metabolic panel  Result Value Ref Range   Sodium 131 (L) 135 - 145 mmol/L   Potassium 3.9 3.5 - 5.1 mmol/L   Chloride 97 (L) 98 - 111 mmol/L   CO2 25 22 - 32 mmol/L   Glucose, Bld 101 (H) 70 - 99 mg/dL   BUN 25 (H) 8 - 23 mg/dL   Creatinine, Ser 1.61 0.61 - 1.24 mg/dL   Calcium 9.1 8.9 - 09.6 mg/dL   GFR, Estimated >04 >54 mL/min   Anion gap 9 5 - 15  CBC  Result Value Ref Range   WBC 7.5 4.0 - 10.5 K/uL   RBC 3.67 (L) 4.22 - 5.81 MIL/uL   Hemoglobin 11.7 (L) 13.0 - 17.0 g/dL   HCT 09.8 (L) 11.9 - 14.7 %   MCV 93.7 80.0 - 100.0 fL   MCH 31.9 26.0 - 34.0 pg   MCHC 34.0 30.0 - 36.0 g/dL   RDW 82.9 56.2 - 13.0 %   Platelets 224 150 - 400 K/uL   nRBC 0.0 0.0 - 0.2 %  Urinalysis, Routine w reflex microscopic  Result Value Ref Range   Color, Urine YELLOW (A) YELLOW   APPearance CLEAR (A) CLEAR   Specific Gravity, Urine 1.017 1.005 - 1.030   pH 6.0 5.0 - 8.0   Glucose, UA NEGATIVE NEGATIVE mg/dL   Hgb urine dipstick NEGATIVE NEGATIVE   Bilirubin Urine NEGATIVE NEGATIVE   Ketones, ur NEGATIVE NEGATIVE mg/dL   Protein, ur NEGATIVE NEGATIVE mg/dL   Nitrite NEGATIVE NEGATIVE   Leukocytes,Ua NEGATIVE NEGATIVE   RBC / HPF 6-10 0 - 5 RBC/hpf   WBC, UA 0-5 0 - 5 WBC/hpf   Bacteria, UA NONE SEEN NONE SEEN   Squamous Epithelial / HPF NONE SEEN 0 - 5  Procalcitonin - Baseline  Result Value Ref Range   Procalcitonin <0.10 ng/mL  Vitamin B12  Result Value Ref Range   Vitamin B-12 605 180 - 914 pg/mL  CBC with Differential/Platelet  Result Value Ref Range   WBC 6.9 4.0 - 10.5 K/uL   RBC 3.93 (L) 4.22 - 5.81 MIL/uL   Hemoglobin 12.4 (L) 13.0 - 17.0 g/dL   HCT 86.5 (L) 78.4 - 69.6 %   MCV 91.6 80.0 - 100.0 fL   MCH 31.6 26.0 - 34.0  pg   MCHC 34.4 30.0 - 36.0 g/dL   RDW 16.1 09.6 - 04.5 %   Platelets 244 150 - 400 K/uL   nRBC 0.0 0.0 -  0.2 %   Neutrophils Relative % 66 %   Neutro Abs 4.6 1.7 - 7.7 K/uL   Lymphocytes Relative 17 %   Lymphs Abs 1.2 0.7 - 4.0 K/uL   Monocytes Relative 9 %   Monocytes Absolute 0.7 0.1 - 1.0 K/uL   Eosinophils Relative 6 %   Eosinophils Absolute 0.4 0.0 - 0.5 K/uL   Basophils Relative 1 %   Basophils Absolute 0.1 0.0 - 0.1 K/uL   Immature Granulocytes 1 %   Abs Immature Granulocytes 0.04 0.00 - 0.07 K/uL  Basic metabolic panel  Result Value Ref Range   Sodium 134 (L) 135 - 145 mmol/L   Potassium 4.0 3.5 - 5.1 mmol/L   Chloride 101 98 - 111 mmol/L   CO2 23 22 - 32 mmol/L   Glucose, Bld 99 70 - 99 mg/dL   BUN 16 8 - 23 mg/dL   Creatinine, Ser 4.09 0.61 - 1.24 mg/dL   Calcium 9.0 8.9 - 81.1 mg/dL   GFR, Estimated >91 >47 mL/min   Anion gap 10 5 - 15  Troponin I (High Sensitivity)  Result Value Ref Range   Troponin I (High Sensitivity) 10 <18 ng/L      Assessment & Plan:   Problem List Items Addressed This Visit   None Visit Diagnoses     Cellulitis of right foot    -  Primary   Improving. Continue antibiotics. Call with any concerns or if getting worse.   Rash       Will treat with triamcinalone. Call with any concerns. Continue to monitor.        Follow up plan: Return if symptoms worsen or fail to improve.

## 2022-12-05 ENCOUNTER — Encounter: Payer: Self-pay | Admitting: Family Medicine

## 2022-12-17 DIAGNOSIS — I739 Peripheral vascular disease, unspecified: Secondary | ICD-10-CM | POA: Diagnosis not present

## 2022-12-17 DIAGNOSIS — I2510 Atherosclerotic heart disease of native coronary artery without angina pectoris: Secondary | ICD-10-CM | POA: Diagnosis not present

## 2022-12-17 DIAGNOSIS — I872 Venous insufficiency (chronic) (peripheral): Secondary | ICD-10-CM | POA: Diagnosis not present

## 2022-12-17 DIAGNOSIS — R296 Repeated falls: Secondary | ICD-10-CM | POA: Diagnosis not present

## 2022-12-17 DIAGNOSIS — I10 Essential (primary) hypertension: Secondary | ICD-10-CM | POA: Diagnosis not present

## 2022-12-17 DIAGNOSIS — H3530 Unspecified macular degeneration: Secondary | ICD-10-CM | POA: Diagnosis not present

## 2022-12-17 DIAGNOSIS — E785 Hyperlipidemia, unspecified: Secondary | ICD-10-CM | POA: Diagnosis not present

## 2022-12-17 DIAGNOSIS — S32411D Displaced fracture of anterior wall of right acetabulum, subsequent encounter for fracture with routine healing: Secondary | ICD-10-CM | POA: Diagnosis not present

## 2022-12-17 DIAGNOSIS — M5441 Lumbago with sciatica, right side: Secondary | ICD-10-CM | POA: Diagnosis not present

## 2022-12-17 DIAGNOSIS — I890 Lymphedema, not elsewhere classified: Secondary | ICD-10-CM | POA: Diagnosis not present

## 2022-12-17 DIAGNOSIS — Z9181 History of falling: Secondary | ICD-10-CM | POA: Diagnosis not present

## 2022-12-22 ENCOUNTER — Ambulatory Visit: Payer: Medicare Other | Admitting: Family Medicine

## 2022-12-22 DIAGNOSIS — Z9181 History of falling: Secondary | ICD-10-CM | POA: Diagnosis not present

## 2022-12-22 DIAGNOSIS — I739 Peripheral vascular disease, unspecified: Secondary | ICD-10-CM | POA: Diagnosis not present

## 2022-12-22 DIAGNOSIS — S32411D Displaced fracture of anterior wall of right acetabulum, subsequent encounter for fracture with routine healing: Secondary | ICD-10-CM | POA: Diagnosis not present

## 2022-12-22 DIAGNOSIS — M5441 Lumbago with sciatica, right side: Secondary | ICD-10-CM | POA: Diagnosis not present

## 2022-12-22 DIAGNOSIS — R296 Repeated falls: Secondary | ICD-10-CM | POA: Diagnosis not present

## 2022-12-22 DIAGNOSIS — I10 Essential (primary) hypertension: Secondary | ICD-10-CM | POA: Diagnosis not present

## 2022-12-22 DIAGNOSIS — H3530 Unspecified macular degeneration: Secondary | ICD-10-CM | POA: Diagnosis not present

## 2022-12-22 DIAGNOSIS — I872 Venous insufficiency (chronic) (peripheral): Secondary | ICD-10-CM | POA: Diagnosis not present

## 2022-12-22 DIAGNOSIS — I890 Lymphedema, not elsewhere classified: Secondary | ICD-10-CM | POA: Diagnosis not present

## 2022-12-22 DIAGNOSIS — I2510 Atherosclerotic heart disease of native coronary artery without angina pectoris: Secondary | ICD-10-CM | POA: Diagnosis not present

## 2022-12-22 DIAGNOSIS — E785 Hyperlipidemia, unspecified: Secondary | ICD-10-CM | POA: Diagnosis not present

## 2023-01-26 ENCOUNTER — Ambulatory Visit: Payer: Self-pay

## 2023-01-26 NOTE — Telephone Encounter (Signed)
Swelling in both legs, weeping   On Lasix. Lives alone.   Best contact: [phone]    Chief Complaint: Swelling and weeping to both legs. Right is worse. Niece reports he refuses OV. Asking if Home Health nurse can be sent to evaluate him. Symptoms: Swelling to shins Frequency: 1-2 weeks ago Pertinent Negatives: Patient denies any redness Disposition: [] ED /[] Urgent Care (no appt availability in office) / [] Appointment(In office/virtual)/ []  [facility]/ [] Home Care/ [x] Refused Recommended Disposition /[]  Mobile Bus/ []  Follow-up with PCP Additional Notes: Please advise niece, Ms. Bryon Lions.  Reason for Disposition  [1] MODERATE leg swelling (e.g., swelling extends up to knees) AND [2] new-onset or worsening  Answer Assessment - Initial Assessment Questions 1. ONSET: "When did the swelling start?" (e.g., minutes, hours, days)     Last week 2. LOCATION: "What part of the leg is swollen?"  "Are both legs swollen or just one leg?"     Both legs, right is worse 3. SEVERITY: "How bad is the swelling?" (e.g., localized; mild, moderate, severe)   - Localized: Small area of swelling localized to one leg.   - MILD pedal edema: Swelling limited to foot and ankle, pitting edema < 1/4 inch (6 mm) deep, rest and elevation eliminate most or all swelling.   - MODERATE edema: Swelling of lower leg to knee, pitting edema > 1/4 inch (6 mm) deep, rest and elevation only partially reduce swelling.   - SEVERE edema: Swelling extends above knee, facial or hand swelling present.      Shin 4. REDNESS: "Does the swelling look red or infected?"     No 5. PAIN: "Is the swelling painful to touch?" If Yes, ask: "How painful is it?"   (Scale 1-10; mild, moderate or severe)     None 6. FEVER: "Do you have a fever?" If Yes, ask: "What is it, how was it measured, and when did it start?"      No 7. CAUSE: "What do you think is causing the leg swelling?"     Unsure 8. MEDICAL HISTORY: "Do  you have a history of blood clots (e.g., DVT), cancer, heart failure, kidney disease, or liver failure?"     No 9. RECURRENT SYMPTOM: "Have you had leg swelling before?" If Yes, ask: "When was the last time?" "What happened that time?"     Yes 10. OTHER SYMPTOMS: "Do you have any other symptoms?" (e.g., chest pain, difficulty breathing)       Weeping 11. PREGNANCY: "Is there any chance you are pregnant?" "When was your last menstrual period?"       N/a  Protocols used: Leg Swelling and Edema-A-AH

## 2023-01-30 ENCOUNTER — Ambulatory Visit: Payer: Self-pay

## 2023-01-30 NOTE — Telephone Encounter (Signed)
Needs appt

## 2023-01-30 NOTE — Telephone Encounter (Signed)
  Chief Complaint: Follow up from NT call of 01/26/2023 Symptoms: The same since 8/5 - pt also has diarrhea Frequency: 01/26/2023 Pertinent Negatives: Patient denies  Disposition: [] ED /[] Urgent Care (no appt availability in office) / [x] Appointment(In office/virtual)/ []  [facility]/ [] Home Care/ [] Refused Recommended Disposition /[] [facility]/ []  Follow-up with PCP Additional Notes: Call from pt's niece, Tammy. She has not heard back from [name] regarding NT call of 01/26/2023. She is upset that she has not heard back.  Pt continues with same s/s as 01/26/2023 call, pt also now has diarrhea.  Advised ED or UC - refused. Tammy would like call back from provider asap.  Appt made for Monday afternoon.    Reason for Disposition  [1] Caller requests to speak ONLY to PCP AND [2] URGENT question  Answer Assessment - Initial Assessment Questions 1. REASON FOR CALL or QUESTION: "What is your reason for calling today?" or "How can I best help you?" or "What question do you have that I can help answer?"     Pt has not received response from provider/ triage call of 01/26/2023 2. CALLER: Document the source of call. (e.g., laboratory, patient).     Niece Tammy  Protocols used: PCP Call - No Triage-A-AH

## 2023-01-30 NOTE — Telephone Encounter (Signed)
He has an appointment with Rashelle @ 3:20 pm.

## 2023-02-02 ENCOUNTER — Encounter: Payer: Self-pay | Admitting: Family Medicine

## 2023-02-02 ENCOUNTER — Ambulatory Visit (INDEPENDENT_AMBULATORY_CARE_PROVIDER_SITE_OTHER): Payer: Medicare Other | Admitting: Family Medicine

## 2023-02-02 VITALS — BP 168/74 | HR 61 | Temp 97.9°F | Wt 163.6 lb

## 2023-02-02 DIAGNOSIS — I872 Venous insufficiency (chronic) (peripheral): Secondary | ICD-10-CM | POA: Diagnosis not present

## 2023-02-02 DIAGNOSIS — H547 Unspecified visual loss: Secondary | ICD-10-CM | POA: Diagnosis not present

## 2023-02-02 DIAGNOSIS — R012 Other cardiac sounds: Secondary | ICD-10-CM | POA: Diagnosis not present

## 2023-02-02 DIAGNOSIS — R197 Diarrhea, unspecified: Secondary | ICD-10-CM | POA: Diagnosis not present

## 2023-02-02 NOTE — Progress Notes (Addendum)
BP (!) 168/74   Pulse 61   Temp 97.9 F (36.6 C) (Oral)   Wt 163 lb 9.6 oz (74.2 kg)   SpO2 98%   BMI 24.16 kg/m    Subjective:    Patient ID: Nicholas Gross, male    DOB: August 02, 1927, 89 y.o.   MRN: [id number]  HPI: Nicholas Gross is a 89 y.o. male  Chief Complaint  Patient presents with   Leg Swelling    Leaking fluids been on going for about 10 days    Diarrhea    Pt states diarrhea has been going on for a couple of weeks    LEG EDEMA Bilateral knee edema bilateral lower extremity edema that started leaking fluid about 10 days ago. He has been taking lasix 20 mg.  Duration: days Location:  Bilateral lower extremity edema  History of trauma in area: no Pain: yes knee pain when waking up in the morning Severity: 2/10 soreness Redness: yes Swelling: yes Oozing: yes Pus: yes Fevers: no Nausea/vomiting: no Status: worse Treatments attempted: Tried elevating legs, does not help much   Tetanus: UTD   ABDOMINAL ISSUES Complains of diarrhea  4-5x day that has been ongoing now for two weeks.  He describes it has brown loose stools varying between small to large amounts. Water intake is 1 bottle daily. Denies any triggers with his diarrhea. Duration:10 days  Constipation: no Diarrhea: yes Episodes of diarrhea/day:4-5 day Heartburn: no Bloating:no Flatulence: no Nausea: no Vomiting: no Episodes of vomit/day: Melena or hematochezia: no Rash: no Jaundice: no Fever: no Weight loss: no   Relevant past medical, surgical, family and social history reviewed and updated as indicated. Interim medical history since our last visit reviewed. Allergies and medications reviewed and updated.  Review of Systems  Constitutional:  Negative for fever.  Respiratory: Negative.    Cardiovascular:  Positive for leg swelling (Bilateral). Negative for chest pain and palpitations.  Gastrointestinal:  Positive for diarrhea. Negative for abdominal pain, constipation, nausea and  vomiting.  Skin:  Positive for color change.    Per HPI unless specifically indicated above     Objective:    BP (!) 168/74   Pulse 61   Temp 97.9 F (36.6 C) (Oral)   Wt 163 lb 9.6 oz (74.2 kg)   SpO2 98%   BMI 24.16 kg/m   Wt Readings from Last 3 Encounters:  02/02/23 163 lb 9.6 oz (74.2 kg)  12/02/22 161 lb 6.4 oz (73.2 kg)  11/28/22 158 lb (71.7 kg)    Physical Exam Vitals and nursing note reviewed.  Constitutional:      General: He is awake. He is not in acute distress.    Appearance: Normal appearance. He is well-developed and well-groomed. He is not ill-appearing.  HENT:     Head: Normocephalic and atraumatic.     Right Ear: Hearing and external ear normal. No drainage.     Left Ear: Hearing and external ear normal. No drainage.     Nose: Nose normal.  Eyes:     General: Lids are normal.        Right eye: No discharge.        Left eye: No discharge.     Conjunctiva/sclera: Conjunctivae normal.  Cardiovascular:     Rate and Rhythm: Normal rate. Rhythm irregular.     Pulses:          Radial pulses are 2+ on the right side and 2+ on the left side.  Posterior tibial pulses are 2+ on the right side and 2+ on the left side.     Heart sounds: S1 normal and S2 normal. Heart sounds are distant. No murmur heard.    No gallop.  Pulmonary:     Effort: Pulmonary effort is normal. No accessory muscle usage or respiratory distress.     Breath sounds: Normal breath sounds.  Abdominal:     General: Bowel sounds are normal. There is no distension.     Tenderness: There is no abdominal tenderness.  Musculoskeletal:        General: Normal range of motion.     Cervical back: Full passive range of motion without pain and normal range of motion.     Right knee: Swelling present.     Left knee: Swelling present.     Right lower leg: 2+ Pitting Edema present.     Left lower leg: 2+ Pitting Edema present.  Skin:    General: Skin is warm and moist.     Capillary Refill:  Capillary refill takes less than 2 seconds.       Neurological:     Mental Status: He is alert and oriented to person, place, and time.  Psychiatric:        Attention and Perception: Attention normal.        Mood and Affect: Mood normal.        Speech: Speech normal.        Behavior: Behavior normal. Behavior is cooperative.        Thought Content: Thought content normal.     Results for orders placed or performed during the hospital encounter of 06/17/22  Basic metabolic panel  Result Value Ref Range   Sodium 131 (L) 135 - 145 mmol/L   Potassium 3.9 3.5 - 5.1 mmol/L   Chloride 97 (L) 98 - 111 mmol/L   CO2 25 22 - 32 mmol/L   Glucose, Bld 101 (H) 70 - 99 mg/dL   BUN 25 (H) 8 - 23 mg/dL   Creatinine, Ser 1.61 0.61 - 1.24 mg/dL   Calcium 9.1 8.9 - 09.6 mg/dL   GFR, Estimated >04 >54 mL/min   Anion gap 9 5 - 15  CBC  Result Value Ref Range   WBC 7.5 4.0 - 10.5 K/uL   RBC 3.67 (L) 4.22 - 5.81 MIL/uL   Hemoglobin 11.7 (L) 13.0 - 17.0 g/dL   HCT 09.8 (L) 11.9 - 14.7 %   MCV 93.7 80.0 - 100.0 fL   MCH 31.9 26.0 - 34.0 pg   MCHC 34.0 30.0 - 36.0 g/dL   RDW 82.9 56.2 - 13.0 %   Platelets 224 150 - 400 K/uL   nRBC 0.0 0.0 - 0.2 %  Urinalysis, Routine w reflex microscopic  Result Value Ref Range   Color, Urine YELLOW (A) YELLOW   APPearance CLEAR (A) CLEAR   Specific Gravity, Urine 1.017 1.005 - 1.030   pH 6.0 5.0 - 8.0   Glucose, UA NEGATIVE NEGATIVE mg/dL   Hgb urine dipstick NEGATIVE NEGATIVE   Bilirubin Urine NEGATIVE NEGATIVE   Ketones, ur NEGATIVE NEGATIVE mg/dL   Protein, ur NEGATIVE NEGATIVE mg/dL   Nitrite NEGATIVE NEGATIVE   Leukocytes,Ua NEGATIVE NEGATIVE   RBC / HPF 6-10 0 - 5 RBC/hpf   WBC, UA 0-5 0 - 5 WBC/hpf   Bacteria, UA NONE SEEN NONE SEEN   Squamous Epithelial / HPF NONE SEEN 0 - 5  Procalcitonin - Baseline  Result Value Ref Range  Procalcitonin <0.10 ng/mL  Vitamin B12  Result Value Ref Range   Vitamin B-12 605 180 - 914 pg/mL  CBC with  Differential/Platelet  Result Value Ref Range   WBC 6.9 4.0 - 10.5 K/uL   RBC 3.93 (L) 4.22 - 5.81 MIL/uL   Hemoglobin 12.4 (L) 13.0 - 17.0 g/dL   HCT 57.8 (L) 46.9 - 62.9 %   MCV 91.6 80.0 - 100.0 fL   MCH 31.6 26.0 - 34.0 pg   MCHC 34.4 30.0 - 36.0 g/dL   RDW 52.8 41.3 - 24.4 %   Platelets 244 150 - 400 K/uL   nRBC 0.0 0.0 - 0.2 %   Neutrophils Relative % 66 %   Neutro Abs 4.6 1.7 - 7.7 K/uL   Lymphocytes Relative 17 %   Lymphs Abs 1.2 0.7 - 4.0 K/uL   Monocytes Relative 9 %   Monocytes Absolute 0.7 0.1 - 1.0 K/uL   Eosinophils Relative 6 %   Eosinophils Absolute 0.4 0.0 - 0.5 K/uL   Basophils Relative 1 %   Basophils Absolute 0.1 0.0 - 0.1 K/uL   Immature Granulocytes 1 %   Abs Immature Granulocytes 0.04 0.00 - 0.07 K/uL  Basic metabolic panel  Result Value Ref Range   Sodium 134 (L) 135 - 145 mmol/L   Potassium 4.0 3.5 - 5.1 mmol/L   Chloride 101 98 - 111 mmol/L   CO2 23 22 - 32 mmol/L   Glucose, Bld 99 70 - 99 mg/dL   BUN 16 8 - 23 mg/dL   Creatinine, Ser 0.10 0.61 - 1.24 mg/dL   Calcium 9.0 8.9 - 27.2 mg/dL   GFR, Estimated >53 >66 mL/min   Anion gap 10 5 - 15  Troponin I (High Sensitivity)  Result Value Ref Range   Troponin I (High Sensitivity) 10 <18 ng/L      Assessment & Plan:   Problem List Items Addressed This Visit   None Visit Diagnoses     Venous stasis dermatitis of both lower extremities    -  Primary   Acute, ongoing. Referral placed for Lymphedema clinic. Recommend elevation and compression stockings. Appointment made with cardiology for further management.   Relevant Orders   Comp Met (CMET)   Urinalysis, Routine w reflex microscopic   AMB referral to rehabilitation   Diarrhea, unspecified type       Acute, ongoing. CBC, CMP, UA done today, will treat based on results. Recommend increased fluids to prevent dehydration and BRAT diet.   Relevant Orders   CBC w/Diff   Comp Met (CMET)   Vision problems       Relevant Orders   Ambulatory  referral to Home Health   Abnormal heart sounds       Acute. Appointment made with Cardiology.        Follow up plan: Return if symptoms worsen or fail to improve.

## 2023-02-02 NOTE — Patient Instructions (Addendum)
Elevate legs Try Tylenol for pain relief  Follow up with Cardiology

## 2023-02-05 ENCOUNTER — Encounter: Payer: Self-pay | Admitting: Family Medicine

## 2023-02-05 NOTE — Progress Notes (Signed)
Hi Nicholas Gross,  Your CBC results came back stable, no significant changes in your hemoglobin/hematocrit, and RBC's over the past few months. Your calcium came back on the low end. I recommend increasing Calcium foods in your diet, consisting of yogurt, cheese, milk, green leafy vegetables, or foods with added calcium. Your urine came back normal and did not show any signs of dehydration. I recommend continue with adequate hydration and bowel rest using BRAT diet, bananas, rice, applesauce, and toast. Thank you for allowing me to participate in your care.

## 2023-02-16 ENCOUNTER — Encounter: Payer: Self-pay | Admitting: Family Medicine

## 2023-02-16 ENCOUNTER — Ambulatory Visit (INDEPENDENT_AMBULATORY_CARE_PROVIDER_SITE_OTHER): Payer: Medicare Other | Admitting: Family Medicine

## 2023-02-16 VITALS — BP 170/70 | HR 63 | Ht 69.0 in | Wt 168.2 lb

## 2023-02-16 DIAGNOSIS — M1611 Unilateral primary osteoarthritis, right hip: Secondary | ICD-10-CM

## 2023-02-16 DIAGNOSIS — R197 Diarrhea, unspecified: Secondary | ICD-10-CM

## 2023-02-16 DIAGNOSIS — I25118 Atherosclerotic heart disease of native coronary artery with other forms of angina pectoris: Secondary | ICD-10-CM | POA: Diagnosis not present

## 2023-02-16 DIAGNOSIS — R600 Localized edema: Secondary | ICD-10-CM

## 2023-02-16 DIAGNOSIS — H3530 Unspecified macular degeneration: Secondary | ICD-10-CM

## 2023-02-16 DIAGNOSIS — L03115 Cellulitis of right lower limb: Secondary | ICD-10-CM | POA: Diagnosis not present

## 2023-02-16 MED ORDER — CIPROFLOXACIN HCL 500 MG PO TABS: 500.0000 mg | ORAL_TABLET | Freq: Two times a day (BID) | ORAL | 0 refills | Status: DC

## 2023-02-16 MED ORDER — METRONIDAZOLE 500 MG PO TABS: 500.0000 mg | ORAL_TABLET | Freq: Two times a day (BID) | ORAL | 0 refills | Status: DC

## 2023-02-16 MED ORDER — FUROSEMIDE 40 MG PO TABS: 40.0000 mg | ORAL_TABLET | Freq: Every day | ORAL | 0 refills | Status: DC

## 2023-02-16 NOTE — Assessment & Plan Note (Signed)
Needs help at home. Will order home health.

## 2023-02-16 NOTE — Progress Notes (Signed)
BP (!) 170/70   Pulse 63   Ht 5\' 9"  (1.753 m)   Wt 168 lb 3.2 oz (76.3 kg)   SpO2 100%   BMI 24.84 kg/m    Subjective:    Patient ID: Nicholas Gross, male    DOB: 1928-03-04, 89 y.o.   MRN: [id number]  HPI: Nicholas Gross is a 89 y.o. male  Chief Complaint  Patient presents with   Leg Swelling    Patient niece says she has noticed a lot of swelling in the patient's legs and knees. Patient says that she is more concerned as the swelling usually go down his leg, but it seems now to be stopped at his knees and going back up to his thighs.    Diarrhea   Continues with swelling in his legs. He notes that the L one is significantly better, but the R one has continued and is swelling up to his thigh. He also notes that his scrotum is swollen. Some oozing of the R calf and warmth. He has not been wearing compression stockings. He has been taking his medications as prescribed.   ABDOMINAL ISSUES Duration: 3-5 weeks Nature: cramping and diarrhea Location: generalized  Severity: moderate  Radiation: no Frequency: 5-6x a day Treatments attempted: none Constipation: no Diarrhea: yes Episodes of diarrhea/day: 5-6x a day Mucous in the stool: no Heartburn: no Bloating:no Flatulence: yes Nausea: no Melena or hematochezia: no Rash: no Jaundice: no Fever: no Weight loss: no   Relevant past medical, surgical, family and social history reviewed and updated as indicated. Interim medical history since our last visit reviewed. Allergies and medications reviewed and updated.  Review of Systems  Constitutional: Negative.   HENT: Negative.    Respiratory: Negative.    Cardiovascular:  Positive for leg swelling. Negative for chest pain and palpitations.  Gastrointestinal: Negative.   Musculoskeletal:  Positive for arthralgias and myalgias. Negative for back pain, gait problem, joint swelling, neck pain and neck stiffness.  Skin: Negative.   Neurological: Negative.    Psychiatric/Behavioral: Negative.      Per HPI unless specifically indicated above     Objective:    BP (!) 170/70   Pulse 63   Ht 5\' 9"  (1.753 m)   Wt 168 lb 3.2 oz (76.3 kg)   SpO2 100%   BMI 24.84 kg/m   Wt Readings from Last 3 Encounters:  02/16/23 168 lb 3.2 oz (76.3 kg)  02/02/23 163 lb 9.6 oz (74.2 kg)  12/02/22 161 lb 6.4 oz (73.2 kg)    Physical Exam Vitals and nursing note reviewed.  Constitutional:      General: He is not in acute distress.    Appearance: Normal appearance. He is normal weight. He is not ill-appearing, toxic-appearing or diaphoretic.  HENT:     Head: Normocephalic and atraumatic.     Right Ear: External ear normal.     Left Ear: External ear normal.     Nose: Nose normal.     Mouth/Throat:     Mouth: Mucous membranes are moist.     Pharynx: Oropharynx is clear.  Eyes:     General: No scleral icterus.       Right eye: No discharge.        Left eye: No discharge.     Extraocular Movements: Extraocular movements intact.     Conjunctiva/sclera: Conjunctivae normal.     Pupils: Pupils are equal, round, and reactive to light.  Cardiovascular:     Rate  and Rhythm: Normal rate and regular rhythm.     Pulses: Normal pulses.     Heart sounds: Murmur heard.     No friction rub. No gallop.  Pulmonary:     Effort: Pulmonary effort is normal. No respiratory distress.     Breath sounds: Normal breath sounds. No stridor. No wheezing, rhonchi or rales.  Chest:     Chest wall: No tenderness.  Musculoskeletal:        General: Normal range of motion.     Cervical back: Normal range of motion and neck supple.     Right lower leg: Edema (1-2+ edema to R thigh) present.     Left lower leg: Edema (trace edema) present.  Skin:    General: Skin is warm and dry.     Capillary Refill: Capillary refill takes less than 2 seconds.     Coloration: Skin is not jaundiced or pale.     Findings: Erythema (red and mildly warm on distal R leg with some brown  crusting oozing) present. No bruising, lesion or rash.  Neurological:     General: No focal deficit present.     Mental Status: He is alert and oriented to person, place, and time. Mental status is at baseline.  Psychiatric:        Mood and Affect: Mood normal.        Behavior: Behavior normal.        Thought Content: Thought content normal.        Judgment: Judgment normal.     Results for orders placed or performed in visit on 02/02/23  CBC w/Diff  Result Value Ref Range   WBC 7.8 3.4 - 10.8 x10E3/uL   RBC 3.60 (L) 4.14 - 5.80 x10E6/uL   Hemoglobin 11.3 (L) 13.0 - 17.7 g/dL   Hematocrit 16.1 (L) 09.6 - 51.0 %   MCV 93 79 - 97 fL   MCH 31.4 26.6 - 33.0 pg   MCHC 33.7 31.5 - 35.7 g/dL   RDW 04.5 40.9 - 81.1 %   Platelets 260 150 - 450 x10E3/uL   Neutrophils 53 Not Estab. %   Lymphs 15 Not Estab. %   Monocytes 11 Not Estab. %   Eos 20 Not Estab. %   Basos 1 Not Estab. %   Neutrophils Absolute 4.1 1.4 - 7.0 x10E3/uL   Lymphocytes Absolute 1.2 0.7 - 3.1 x10E3/uL   Monocytes Absolute 0.9 0.1 - 0.9 x10E3/uL   EOS (ABSOLUTE) 1.6 (H) 0.0 - 0.4 x10E3/uL   Basophils Absolute 0.1 0.0 - 0.2 x10E3/uL   Immature Granulocytes 0 Not Estab. %   Immature Grans (Abs) 0.0 0.0 - 0.1 x10E3/uL  Comp Met (CMET)  Result Value Ref Range   Glucose 92 70 - 99 mg/dL   BUN 10 10 - 36 mg/dL   Creatinine, Ser 9.14 0.76 - 1.27 mg/dL   eGFR 79 >78 GN/FAO/1.30   BUN/Creatinine Ratio 11 10 - 24   Sodium 144 134 - 144 mmol/L   Potassium 3.5 3.5 - 5.2 mmol/L   Chloride 105 96 - 106 mmol/L   CO2 21 20 - 29 mmol/L   Calcium 8.4 (L) 8.6 - 10.2 mg/dL   Total Protein 5.6 (L) 6.0 - 8.5 g/dL   Albumin 3.9 3.6 - 4.6 g/dL   Globulin, Total 1.7 1.5 - 4.5 g/dL   Bilirubin Total 0.4 0.0 - 1.2 mg/dL   Alkaline Phosphatase 71 44 - 121 IU/L   AST 19 0 - 40 IU/L  ALT 12 0 - 44 IU/L  Urinalysis, Routine w reflex microscopic  Result Value Ref Range   Specific Gravity, UA 1.025 1.005 - 1.030   pH, UA 6.0 5.0 -  7.5   Color, UA Yellow Yellow   Appearance Ur Clear Clear   Leukocytes,UA Negative Negative   Protein,UA Negative Negative/Trace   Glucose, UA Negative Negative   Ketones, UA Trace (A) Negative   RBC, UA Negative Negative   Bilirubin, UA Negative Negative   Urobilinogen, Ur 0.2 0.2 - 1.0 mg/dL   Nitrite, UA Negative Negative   Microscopic Examination Comment       Assessment & Plan:   Problem List Items Addressed This Visit       Cardiovascular and Mediastinum   CAD (coronary artery disease)    Needs help at home. Will order home health.       Relevant Medications   furosemide (LASIX) 40 MG tablet   Other Relevant Orders   Ambulatory referral to Home Health     Musculoskeletal and Integument   Osteoarthritis of right hip    Needs help at home. Will order home health.       Relevant Orders   Ambulatory referral to Home Health     Other   Macular degeneration    Needs help at home. Will order home health.       Relevant Orders   Ambulatory referral to Home Health   Other Visit Diagnoses     Diarrhea, unspecified type    -  Primary   Will treat with cipro and flagyl. If not significantly better in the next week consider stool studies and possible CT scan. Call with any concerns.   Relevant Orders   Ambulatory referral to Home Health   Cellulitis of right foot       Treating diarrhea with cipro and flagyl. Will get fluid off and recheck in 2 weeks. Call with any concerns. Needs help at home. Will order home health.   Relevant Orders   Ambulatory referral to Home Health   Peripheral edema       Better. Will stop HCTZ and increase his lasix to 40mg  daily. Recheck 2 weeks. Needs help at home. Will order home health.   Relevant Orders   Ambulatory referral to Home Health        Follow up plan: Return in about 2 weeks (around 03/02/2023).

## 2023-02-19 ENCOUNTER — Other Ambulatory Visit: Payer: Self-pay | Admitting: Family Medicine

## 2023-02-20 NOTE — Telephone Encounter (Signed)
Requested Prescriptions  Pending Prescriptions Disp Refills   hydrOXYzine (VISTARIL) 25 MG capsule [Pharmacy Med Name: HYDROXYZINE PAMOATE 25 MG CAP] 90 capsule 0    Sig: TAKE 1 CAPSULE BY MOUTH EVERY 8 HOURS ASNEEDED FOR ITCHING     Ear, Nose, and Throat:  Antihistamines 2 Passed - 02/19/2023 10:51 AM      Passed - Cr in normal range and within 360 days    Creatinine, Ser  Date Value Ref Range Status  02/02/2023 0.91 0.76 - 1.27 mg/dL Final         Passed - Valid encounter within last 12 months    Recent Outpatient Visits           4 days ago Diarrhea, unspecified type   [place] [facility] [place], Megan P, DO   2 weeks ago Venous stasis dermatitis of both lower extremities   [place] [facility] [place], Sherran Needs, NP   2 months ago Cellulitis of right foot   [place] [facility] [place], Megan P, DO   2 months ago Cellulitis of right foot   [place] [facility] Larae Grooms, NP   2 months ago Peripheral edema   [place] [facility] [place], Oralia Rud, DO       Future Appointments             In 1 week Laural Benes, Oralia Rud, DO [place] [facility]   In 1 week Charlsie Quest, NP [place] [facility]

## 2023-03-02 ENCOUNTER — Telehealth: Payer: Self-pay | Admitting: *Deleted

## 2023-03-02 ENCOUNTER — Ambulatory Visit
Admission: RE | Admit: 2023-03-02 | Discharge: 2023-03-02 | Disposition: A | Discharge status: Home / Self Care | Payer: Medicare Other | Source: Ambulatory Visit | Attending: Family Medicine | Admitting: Family Medicine

## 2023-03-02 ENCOUNTER — Ambulatory Visit (INDEPENDENT_AMBULATORY_CARE_PROVIDER_SITE_OTHER): Payer: Medicare Other | Admitting: Family Medicine

## 2023-03-02 ENCOUNTER — Encounter: Payer: Self-pay | Admitting: Family Medicine

## 2023-03-02 VITALS — BP 188/74 | HR 66 | Temp 97.8°F | Wt 160.2 lb

## 2023-03-02 DIAGNOSIS — I700 Atherosclerosis of aorta: Secondary | ICD-10-CM | POA: Diagnosis not present

## 2023-03-02 DIAGNOSIS — N400 Enlarged prostate without lower urinary tract symptoms: Secondary | ICD-10-CM | POA: Diagnosis not present

## 2023-03-02 DIAGNOSIS — R197 Diarrhea, unspecified: Secondary | ICD-10-CM | POA: Diagnosis present

## 2023-03-02 DIAGNOSIS — R600 Localized edema: Secondary | ICD-10-CM | POA: Diagnosis not present

## 2023-03-02 DIAGNOSIS — K8020 Calculus of gallbladder without cholecystitis without obstruction: Secondary | ICD-10-CM | POA: Diagnosis not present

## 2023-03-02 MED ORDER — IOHEXOL 300 MG/ML  SOLN
80.0000 mL | Freq: Once | INTRAMUSCULAR | Status: AC | PRN
  Administered 2023-03-02: 80 mL via INTRAVENOUS

## 2023-03-02 NOTE — Progress Notes (Signed)
BP (!) 188/74   Pulse 66   Temp 97.8 F (36.6 C) (Oral)   Wt 160 lb 3.2 oz (72.7 kg)   SpO2 99%   BMI 23.66 kg/m    Subjective:    Patient ID: Nicholas Gross, male    DOB: Feb 25, 1928, 89 y.o.   MRN: [id number]  HPI: Nicholas Gross is a 89 y.o. male  Chief Complaint  Patient presents with   Diarrhea    Pt states that it hasn't slowed down since last being seen   ABDOMINAL ISSUES Duration:  about 6 weeks Ashby Dawes: cramping Location: diffuse  Severity: moderate  Radiation: no Frequency: 3-6x a day Alleviating factors: nothing Aggravating factors: eating Treatments attempted:  antibiotics Constipation: no Diarrhea: yes Episodes of diarrhea/day: 3-6x a day Mucous in the stool: no Heartburn: no Bloating:no Flatulence: no Nausea: no Vomiting: no Melena or hematochezia: no Rash: no Jaundice: no Fever: no Weight loss: yes  Relevant past medical, surgical, family and social history reviewed and updated as indicated. Interim medical history since our last visit reviewed. Allergies and medications reviewed and updated.  Review of Systems  Constitutional: Negative.   Respiratory: Negative.    Cardiovascular: Negative.   Gastrointestinal:  Positive for diarrhea. Negative for abdominal distention, abdominal pain, anal bleeding, blood in stool, constipation, nausea, rectal pain and vomiting.  Musculoskeletal: Negative.   Psychiatric/Behavioral: Negative.      Per HPI unless specifically indicated above     Objective:    BP (!) 188/74   Pulse 66   Temp 97.8 F (36.6 C) (Oral)   Wt 160 lb 3.2 oz (72.7 kg)   SpO2 99%   BMI 23.66 kg/m   Wt Readings from Last 3 Encounters:  03/02/23 160 lb 3.2 oz (72.7 kg)  02/16/23 168 lb 3.2 oz (76.3 kg)  02/02/23 163 lb 9.6 oz (74.2 kg)    Physical Exam Vitals and nursing note reviewed.  Constitutional:      General: He is not in acute distress.    Appearance: Normal appearance. He is not ill-appearing,  toxic-appearing or diaphoretic.  HENT:     Head: Normocephalic and atraumatic.     Right Ear: External ear normal.     Left Ear: External ear normal.     Nose: Nose normal.     Mouth/Throat:     Mouth: Mucous membranes are moist.     Pharynx: Oropharynx is clear.  Eyes:     General: No scleral icterus.       Right eye: No discharge.        Left eye: No discharge.     Extraocular Movements: Extraocular movements intact.     Conjunctiva/sclera: Conjunctivae normal.     Pupils: Pupils are equal, round, and reactive to light.  Cardiovascular:     Rate and Rhythm: Normal rate and regular rhythm.     Pulses: Normal pulses.     Heart sounds: Murmur heard.     No friction rub. No gallop.  Pulmonary:     Effort: Pulmonary effort is normal. No respiratory distress.     Breath sounds: Normal breath sounds. No stridor. No wheezing, rhonchi or rales.  Chest:     Chest wall: No tenderness.  Abdominal:     General: Abdomen is flat. Bowel sounds are normal. There is no distension.     Palpations: Abdomen is soft. There is no mass.     Tenderness: There is no abdominal tenderness. There is no right CVA tenderness,  left CVA tenderness, guarding or rebound.     Hernia: No hernia is present.  Musculoskeletal:        General: Normal range of motion.     Cervical back: Normal range of motion and neck supple.  Skin:    General: Skin is warm and dry.     Capillary Refill: Capillary refill takes less than 2 seconds.     Coloration: Skin is not jaundiced or pale.     Findings: No bruising, erythema, lesion or rash.  Neurological:     General: No focal deficit present.     Mental Status: He is alert and oriented to person, place, and time. Mental status is at baseline.  Psychiatric:        Mood and Affect: Mood normal.        Behavior: Behavior normal.        Thought Content: Thought content normal.        Judgment: Judgment normal.     Results for orders placed or performed in visit on  02/02/23  CBC w/Diff  Result Value Ref Range   WBC 7.8 3.4 - 10.8 x10E3/uL   RBC 3.60 (L) 4.14 - 5.80 x10E6/uL   Hemoglobin 11.3 (L) 13.0 - 17.7 g/dL   Hematocrit 56.4 (L) 33.2 - 51.0 %   MCV 93 79 - 97 fL   MCH 31.4 26.6 - 33.0 pg   MCHC 33.7 31.5 - 35.7 g/dL   RDW 95.1 88.4 - 16.6 %   Platelets 260 150 - 450 x10E3/uL   Neutrophils 53 Not Estab. %   Lymphs 15 Not Estab. %   Monocytes 11 Not Estab. %   Eos 20 Not Estab. %   Basos 1 Not Estab. %   Neutrophils Absolute 4.1 1.4 - 7.0 x10E3/uL   Lymphocytes Absolute 1.2 0.7 - 3.1 x10E3/uL   Monocytes Absolute 0.9 0.1 - 0.9 x10E3/uL   EOS (ABSOLUTE) 1.6 (H) 0.0 - 0.4 x10E3/uL   Basophils Absolute 0.1 0.0 - 0.2 x10E3/uL   Immature Granulocytes 0 Not Estab. %   Immature Grans (Abs) 0.0 0.0 - 0.1 x10E3/uL  Comp Met (CMET)  Result Value Ref Range   Glucose 92 70 - 99 mg/dL   BUN 10 10 - 36 mg/dL   Creatinine, Ser 0.63 0.76 - 1.27 mg/dL   eGFR 79 >01 SW/FUX/3.23   BUN/Creatinine Ratio 11 10 - 24   Sodium 144 134 - 144 mmol/L   Potassium 3.5 3.5 - 5.2 mmol/L   Chloride 105 96 - 106 mmol/L   CO2 21 20 - 29 mmol/L   Calcium 8.4 (L) 8.6 - 10.2 mg/dL   Total Protein 5.6 (L) 6.0 - 8.5 g/dL   Albumin 3.9 3.6 - 4.6 g/dL   Globulin, Total 1.7 1.5 - 4.5 g/dL   Bilirubin Total 0.4 0.0 - 1.2 mg/dL   Alkaline Phosphatase 71 44 - 121 IU/L   AST 19 0 - 40 IU/L   ALT 12 0 - 44 IU/L  Urinalysis, Routine w reflex microscopic  Result Value Ref Range   Specific Gravity, UA 1.025 1.005 - 1.030   pH, UA 6.0 5.0 - 7.5   Color, UA Yellow Yellow   Appearance Ur Clear Clear   Leukocytes,UA Negative Negative   Protein,UA Negative Negative/Trace   Glucose, UA Negative Negative   Ketones, UA Trace (A) Negative   RBC, UA Negative Negative   Bilirubin, UA Negative Negative   Urobilinogen, Ur 0.2 0.2 - 1.0 mg/dL   Nitrite,  UA Negative Negative   Microscopic Examination Comment       Assessment & Plan:   Problem List Items Addressed This Visit    None Visit Diagnoses     Diarrhea, unspecified type    -  Primary   No better. Will check stool studies and CT abdomen. Await results. Treat as needed.   Relevant Orders   CT ABDOMEN PELVIS W CONTRAST   Basic metabolic panel   Peripheral edema       Significantly improved from prior. Checking BMP. Continue lasix. Call with any concerns.        Follow up plan: Return in about 2 weeks (around 03/16/2023).

## 2023-03-02 NOTE — Telephone Encounter (Signed)
  Chief Complaint: Results Symptoms: NA Frequency: NA Pertinent Negatives: Patient denies NA Disposition: [] ED /[] Urgent Care (no appt availability in office) / [] Appointment(In office/virtual)/ []  [place] [facility]/ [] Home Care/ [] Refused Recommended Disposition /[] [place] [facility]/ []  Follow-up with PCP Additional Notes:  [name] niece [name] calling for imaging results. Reviewed note, verbalizes understanding.    "Called and LMOM to call back for results. CT pretty normal- no sign of infection or liquid stool in his colon. Unfortunately that means I think we have to try to get the stool samples. For now while we're waiting on them, I'd recommend starting some metamucil to try to bulk up his stools a bit. OK to give this message to him if he calls back."   Please make sure [facility] can give this result out

## 2023-03-03 LAB — BASIC METABOLIC PANEL WITH GFR
BUN/Creatinine Ratio: 8 — ABNORMAL LOW (ref 10–24)
BUN: 8 mg/dL — ABNORMAL LOW (ref 10–36)
CO2: 25 mmol/L (ref 20–29)
Calcium: 8.7 mg/dL (ref 8.6–10.2)
Chloride: 99 mmol/L (ref 96–106)
Creatinine, Ser: 1.03 mg/dL (ref 0.76–1.27)
Glucose: 94 mg/dL (ref 70–99)
Potassium: 3 mmol/L — ABNORMAL LOW (ref 3.5–5.2)
Sodium: 140 mmol/L (ref 134–144)
eGFR: 68 mL/min/1.73

## 2023-03-05 ENCOUNTER — Ambulatory Visit: Payer: Medicare Other | Admitting: Cardiology

## 2023-03-16 ENCOUNTER — Encounter: Payer: Self-pay | Admitting: Family Medicine

## 2023-03-16 ENCOUNTER — Ambulatory Visit (INDEPENDENT_AMBULATORY_CARE_PROVIDER_SITE_OTHER): Payer: Medicare Other | Admitting: Family Medicine

## 2023-03-16 ENCOUNTER — Ambulatory Visit
Admission: RE | Admit: 2023-03-16 | Discharge: 2023-03-16 | Disposition: A | Discharge status: Home / Self Care | Payer: Medicare Other | Source: Ambulatory Visit | Attending: Family Medicine | Admitting: Family Medicine

## 2023-03-16 ENCOUNTER — Ambulatory Visit
Admission: RE | Admit: 2023-03-16 | Discharge: 2023-03-16 | Disposition: A | Discharge status: Home / Self Care | Payer: Medicare Other | Attending: Family Medicine | Admitting: Family Medicine

## 2023-03-16 VITALS — BP 170/72 | HR 62 | Ht 69.0 in | Wt 159.8 lb

## 2023-03-16 DIAGNOSIS — R0781 Pleurodynia: Secondary | ICD-10-CM

## 2023-03-16 DIAGNOSIS — R197 Diarrhea, unspecified: Secondary | ICD-10-CM | POA: Diagnosis not present

## 2023-03-16 MED ORDER — METAMUCIL SMOOTH TEXTURE 58.6 % PO POWD: 1.0000 | Freq: Every day | ORAL | 12 refills | Status: DC

## 2023-03-16 MED ORDER — PROBIOTIC BLEND PO CAPS: ORAL_CAPSULE | ORAL | 12 refills | Status: DC

## 2023-03-16 NOTE — Progress Notes (Signed)
BP (!) 170/72 (BP Location: Left Arm, Cuff Size: Normal)   Pulse 62   Ht 5\' 9"  (1.753 m)   Wt 159 lb 12.8 oz (72.5 kg)   SpO2 99%   BMI 23.60 kg/m    Subjective:    Patient ID: Nicholas Gross, male    DOB: October 17, 1927, 89 y.o.   MRN: [id number]  HPI: Nicholas Gross is a 89 y.o. male  Chief Complaint  Patient presents with   Diarrhea    Patient says he used the bathroom one time yesterday, and twice this morning. Patient says the second time, it was a runny stool.    Fall    Patient neighbor says patient says he fell twice on Friday. Patient says he has a bruise on his hand and leg. Patient declines having any pain in the leg. Patient says he thinks he hurt his rib cage on the R side. Patient says he has been using a heating pad.    [name] notes that his diarrhea has not gotten better. He has a normal BM in the morning followed by several BMs during the day that get more and more loose. No blood. No nausea. No vomiting. He has not been able to do his stool studies.   He fell when he was walking to his mailbox on Friday. He notes that he got caught in the rain when he was walking down to pick up the mail and then fell. He notes that he fell into the grass. He states that his ribs are hurting on the R side. Pain is aching and sharp. No radiation. No other symptoms. No other concerns or complaints at this time.   Relevant past medical, surgical, family and social history reviewed and updated as indicated. Interim medical history since our last visit reviewed. Allergies and medications reviewed and updated.  Review of Systems  Constitutional: Negative.   Respiratory: Negative.    Cardiovascular: Negative.   Gastrointestinal:  Positive for diarrhea. Negative for abdominal distention, abdominal pain, anal bleeding, blood in stool, constipation, nausea, rectal pain and vomiting.  Musculoskeletal:  Positive for myalgias. Negative for arthralgias, back pain, gait problem, joint  swelling, neck pain and neck stiffness.  Skin: Negative.   Psychiatric/Behavioral: Negative.      Per HPI unless specifically indicated above     Objective:    BP (!) 170/72 (BP Location: Left Arm, Cuff Size: Normal)   Pulse 62   Ht 5\' 9"  (1.753 m)   Wt 159 lb 12.8 oz (72.5 kg)   SpO2 99%   BMI 23.60 kg/m   Wt Readings from Last 3 Encounters:  03/16/23 159 lb 12.8 oz (72.5 kg)  03/02/23 160 lb 3.2 oz (72.7 kg)  02/16/23 168 lb 3.2 oz (76.3 kg)    Physical Exam Vitals and nursing note reviewed.  Constitutional:      General: He is not in acute distress.    Appearance: Normal appearance. He is not ill-appearing, toxic-appearing or diaphoretic.  HENT:     Head: Normocephalic and atraumatic.     Right Ear: External ear normal.     Left Ear: External ear normal.     Nose: Nose normal.     Mouth/Throat:     Mouth: Mucous membranes are moist.     Pharynx: Oropharynx is clear.  Eyes:     General: No scleral icterus.       Right eye: No discharge.        Left eye: No  discharge.     Extraocular Movements: Extraocular movements intact.     Conjunctiva/sclera: Conjunctivae normal.     Pupils: Pupils are equal, round, and reactive to light.  Cardiovascular:     Rate and Rhythm: Normal rate and regular rhythm.     Pulses: Normal pulses.     Heart sounds: Normal heart sounds. No murmur heard.    No friction rub. No gallop.  Pulmonary:     Effort: Pulmonary effort is normal. No respiratory distress.     Breath sounds: Normal breath sounds. No stridor. No wheezing, rhonchi or rales.  Chest:     Chest wall: No tenderness.  Abdominal:     General: Abdomen is flat. Bowel sounds are normal. There is no distension.     Palpations: Abdomen is soft. There is no mass.     Tenderness: There is no abdominal tenderness. There is no right CVA tenderness, left CVA tenderness, guarding or rebound.     Hernia: No hernia is present.  Musculoskeletal:        General: Normal range of motion.      Cervical back: Normal range of motion and neck supple.     Comments: Point tenderness on rib 7 on the R  Skin:    General: Skin is warm and dry.     Capillary Refill: Capillary refill takes less than 2 seconds.     Coloration: Skin is not jaundiced or pale.     Findings: No bruising, erythema, lesion or rash.  Neurological:     General: No focal deficit present.     Mental Status: He is alert and oriented to person, place, and time. Mental status is at baseline.  Psychiatric:        Mood and Affect: Mood normal.        Behavior: Behavior normal.        Thought Content: Thought content normal.        Judgment: Judgment normal.     Results for orders placed or performed in visit on 03/02/23  Basic metabolic panel  Result Value Ref Range   Glucose 94 70 - 99 mg/dL   BUN 8 (L) 10 - 36 mg/dL   Creatinine, Ser 9.62 0.76 - 1.27 mg/dL   eGFR 68 >95 MW/UXL/2.44   BUN/Creatinine Ratio 8 (L) 10 - 24   Sodium 140 134 - 144 mmol/L   Potassium 3.0 (L) 3.5 - 5.2 mmol/L   Chloride 99 96 - 106 mmol/L   CO2 25 20 - 29 mmol/L   Calcium 8.7 8.6 - 10.2 mg/dL      Assessment & Plan:   Problem List Items Addressed This Visit   None Visit Diagnoses     Diarrhea, unspecified type    -  Primary   Will start probiotics and fiber. Recheck in about a month. Call with any concerns.   Rib pain on right side       Will check x-ray to look for fracture. Await results.   Relevant Orders   DG Ribs Unilateral Right        Follow up plan: Return 3-4 weeks.

## 2023-03-19 ENCOUNTER — Ambulatory Visit: Payer: Self-pay

## 2023-03-19 DIAGNOSIS — I890 Lymphedema, not elsewhere classified: Secondary | ICD-10-CM

## 2023-03-19 DIAGNOSIS — I700 Atherosclerosis of aorta: Secondary | ICD-10-CM

## 2023-03-19 DIAGNOSIS — I25118 Atherosclerotic heart disease of native coronary artery with other forms of angina pectoris: Secondary | ICD-10-CM

## 2023-03-19 DIAGNOSIS — L03116 Cellulitis of left lower limb: Secondary | ICD-10-CM

## 2023-03-19 DIAGNOSIS — I739 Peripheral vascular disease, unspecified: Secondary | ICD-10-CM

## 2023-03-19 DIAGNOSIS — M79669 Pain in unspecified lower leg: Secondary | ICD-10-CM

## 2023-03-19 DIAGNOSIS — I6529 Occlusion and stenosis of unspecified carotid artery: Secondary | ICD-10-CM

## 2023-03-19 DIAGNOSIS — R296 Repeated falls: Secondary | ICD-10-CM

## 2023-03-19 DIAGNOSIS — I10 Essential (primary) hypertension: Secondary | ICD-10-CM

## 2023-03-19 NOTE — Addendum Note (Signed)
Addended by: Dorcas Carrow on: 03/19/2023 03:45 PM   Modules accepted: Orders

## 2023-03-19 NOTE — Telephone Encounter (Signed)
Referral for home health was placed at end of August, but there was an issue with the home health company- we rechecked today and the referral has been replaced. They should be calling shortly.

## 2023-03-19 NOTE — Telephone Encounter (Signed)
Chief Complaint: Wounds Symptoms: open wounds to the ankles and feet, weeping fluid, pain Frequency: chronic  Pertinent Negatives: Patient denies fever, weakness,  Disposition: [] ED /[] Urgent Care (no appt availability in office) / [] Appointment(In office/virtual)/ []  [place] [facility]/ [] Home Care/ [x] Refused Recommended Disposition /[] [place] [facility]/ []  Follow-up with PCP Additional Notes: Patient's niece [name] reports that patient has bilateral foot and ankle wounds that are open and weeping ranging from the size of an easer to the size of a half dollar coin. [name] stated that she has requested home health multiple times and has not had anyone come out since May 2024. [name] states she is in need of wound care supplies and home health. She declined an appointment at this time and wants a home health referral for wound care. Also stated that the PCP can contact her for additional information if needed.   Reason for Disposition  Large sore (> 1 inch or 2.5 cm across)  Answer Assessment - Initial Assessment Questions 1. APPEARANCE of SORES: "What do the sores look like?"     Red and weeping 2. NUMBER: "How many sores are there?"     9-10 open areas 3. SIZE: "How big is the largest sore?"     Some are smaller but the biggest one is the size of a half dollar piece 4. LOCATION: "Where are the sores located?"     Bilateral ankles and feet 5. ONSET: "When did the sores begin?"     I'm not sure  6. TENDER: "Does it hurt when you touch it?"  (Scale 1-10; or mild, moderate, severe)      painful 7. CAUSE: "What do you think is causing the sores?"     Unsure  8. OTHER SYMPTOMS: "Do you have any other symptoms?" (e.g., fever, new weakness)     No  Protocols used: Sores-A-AH

## 2023-03-24 DIAGNOSIS — Z7982 Long term (current) use of aspirin: Secondary | ICD-10-CM | POA: Insufficient documentation

## 2023-03-24 DIAGNOSIS — Z9181 History of falling: Secondary | ICD-10-CM | POA: Insufficient documentation

## 2023-03-24 DIAGNOSIS — I6529 Occlusion and stenosis of unspecified carotid artery: Secondary | ICD-10-CM | POA: Insufficient documentation

## 2023-04-03 ENCOUNTER — Telehealth: Payer: Self-pay | Admitting: Family Medicine

## 2023-04-03 NOTE — Telephone Encounter (Signed)
Copied from CRM [id number]. Topic: General - Inquiry >> Apr 03, 2023  8:47 AM Marlow Baars wrote: Reason for CRM: Clydie Braun the Speech Therapist with [facility] called because the provider wanted the patient to be evaluated this week but her case load is too full and she will not be able to see him until next week.

## 2023-04-03 NOTE — Telephone Encounter (Signed)
Noted  

## 2023-04-07 ENCOUNTER — Telehealth: Payer: Self-pay | Admitting: Family Medicine

## 2023-04-07 NOTE — Telephone Encounter (Signed)
Copied from CRM [id number]. Topic: General - Other >> Apr 07, 2023 12:03 PM Clide Dales wrote: Patient's niece called to let us know that [facility] is sending a request for visit notes and wound supplies order over to be completed.

## 2023-04-10 ENCOUNTER — Ambulatory Visit: Payer: Self-pay

## 2023-04-10 ENCOUNTER — Ambulatory Visit: Payer: Self-pay | Admitting: *Deleted

## 2023-04-10 DIAGNOSIS — R1310 Dysphagia, unspecified: Secondary | ICD-10-CM | POA: Insufficient documentation

## 2023-04-10 DIAGNOSIS — R4701 Aphasia: Secondary | ICD-10-CM | POA: Insufficient documentation

## 2023-04-10 NOTE — Telephone Encounter (Signed)
Orders for wound care were signed a couple of days ago. I'm happy to refer to wound care if needed if they'd like me to put it in before I see him

## 2023-04-10 NOTE — Telephone Encounter (Signed)
Reason for Disposition  Other signs of wound infection  Answer Assessment - Initial Assessment Questions 1. LOCATION: "Where is the wound located?"      Nicholas Gross, nurse with [facility] calling in a report for [name]. She has seen him Mon,, Wed, and today (Fri)  and his legs are looking worse today just since Wed.  He has an appt with [name] this coming [place]. So Nicholas Gross wanted to let [name] know this information prior to that appt.   "I think he is fine until he is seen Thur. But just wanted to make her aware", per Nicholas Gross. 2. WOUND APPEARANCE: "What does the wound look like?"      Both legs from the knees down have pockets that are fluid filled and bursting.   His legs are weeping so bad that it's soaking the gauze and his socks.  They are still waiting for his supplies to come in so she is using calcium alginate to help dry up the  pockets of fluid.    He has dry skin behind both of his knees with the right being worse than the left.   He has been scratching behind his knees due to the dry skin and itching.   This is not a new issue.  He has had this for a while but wanted [name] to be aware. Both of his groins have scabbed over places that were probably open at one point.   The right groin looks worse.  He may need a consult to the wound clinic to see if they can offer anything more for him.  He lives alone but his niece comes over and cares for him.   She is bring him to the appt on Thur.  The pt is stating that he may possibly need to be placed in a facility.   Up to this point he has been adamant about not going to a nursing home of any kind but he brought up the subject with his niece and me.  This is a big change for him. His health is declining per [place].   His legs are very much more red than they were when she saw him Mon. And Wed.    Nicholas Gross, nurse can be reached at [phone] if there are questions or orders.   3. SIZE: If redness is present, ask:  "What is the size of the red area?" (Inches, centimeters, or compare to size of a coin)      Both of his legs are more red today than Wed. When [place] saw him today. 4. SPREAD: "What's changed in the last day?"  "Do you see any red streaks coming from the wound?"     The pockets of fluid on both legs are bursting and weeping a lot of fluid.  It's hard to contain it. 5. ONSET: "When did it start to look infected?"      Nicholas Gross could tell a difference from [place] until today with the time between Wed and today his legs being much more red than on Wed. 6. MECHANISM: "How did the wound start, what was the cause?"     This has been going on for a while 7. PAIN: "Is there any pain?" If Yes, ask: "How bad is the pain?"   (Scale 1-10; or mild, moderate, severe)     No mention 8. FEVER: "Do you have a fever?" If Yes, ask: "What is your temperature, how was it measured, and when did  it start?"     Not asked or mentioned 9. OTHER SYMPTOMS: "Do you have any other symptoms?" (e.g., shaking chills, weakness, rash elsewhere on body)     See notes above.  Pt mentioning placement in a facility. 10. PREGNANCY: "Is there any chance you are pregnant?" "When was your last menstrual period?"       N/A  Protocols used: Wound Infection-A-AH

## 2023-04-10 NOTE — Telephone Encounter (Signed)
As per other message about wound care. We can try to move up his appointment but I am already seeing him on Thursday.

## 2023-04-10 NOTE — Telephone Encounter (Signed)
Maggie Font, CMA faxed requested paperwork back to requested contact information provided.

## 2023-04-10 NOTE — Telephone Encounter (Signed)
This is not new- he follows with Dr. Malvin Johns (neurology) but I don't see that he has seen him recently.

## 2023-04-10 NOTE — Telephone Encounter (Signed)
  Chief Complaint: Report from Victorino Dike, nurse with [facility] Symptoms: Both legs with worsening redness since Mon. And pockets of fluid that are bursting and weeping a lot.  Soaking through wraps and his socks. Frequency: Every day Pertinent Negatives: Patient denies N/A Disposition: [] ED /[] Urgent Care (no appt availability in office) / [] Appointment(In office/virtual)/ []  [place] [facility]/ [] Home Care/ [] Refused Recommended Disposition /[] [place] [facility]/ [x]  Follow-up with PCP Additional Notes: See Jennifer's report in notes.    She can be contacted at [phone] if questions or orders.

## 2023-04-10 NOTE — Telephone Encounter (Signed)
  Chief Complaint: tremors Symptoms: severe tremors that come and goes to legs and arms lasts about 5-6 minutes  Frequency: 2 weeks Pertinent Negatives: Patient denies no change in mental status during or after events Disposition: [] ED /[] Urgent Care (no appt availability in office) / [] Appointment(In office/virtual)/ []  [place] [facility]/ [] Home Care/ [] Refused Recommended Disposition /[] [place] [facility]/ [x]  Follow-up with PCP Additional Notes: please see other triage note Reason for Disposition  Nursing judgment or information in reference  Answer Assessment - Initial Assessment Questions 1. SYMPTOM: "What is the main symptom you are concerned about?" (e.g., weakness, numbness)     tremors 2. ONSET: "When did this start?" (minutes, hours, days; while sleeping)     2 weeks 4. PATTERN "Does this come and go, or has it been constant since it started?"  "Is it present now?"     Comes and goes 6. NEUROLOGIC SYMPTOMS: "Have you had any of the following symptoms: headache, dizziness, vision loss, double vision, changes in speech, unsteady on your feet?"     no 7. OTHER SYMPTOMS: "Do you have any other symptoms?"     no  Answer Assessment - Initial Assessment Questions 1. REASON FOR CALL: "What is your main concern right now?"     Trmors for 2 weeks arms legs 2. ONSET: "When did the tremors start?"     2 weeks  3. SEVERITY: "How bad is the tremors?"     Cannot hold on to a fork or do anything with hands due to intensity of shking 4. FEVER: "Do you have a fever?"     no 5. OTHER SYMPTOMS: "Do you have any other new symptoms?"    Nothing but has leg sores 6. TREATMENTS AND RESPONSE: "What have you done so far to try to  Protocols used: Neurologic Deficit-A-AH, No Guideline Available-A-AH

## 2023-04-13 ENCOUNTER — Telehealth: Payer: Self-pay | Admitting: Family Medicine

## 2023-04-13 ENCOUNTER — Ambulatory Visit: Payer: Self-pay | Admitting: *Deleted

## 2023-04-13 NOTE — Telephone Encounter (Signed)
OK for verbal orders?

## 2023-04-13 NOTE — Telephone Encounter (Signed)
  Chief Complaint: difficulty swallowing solids per Clydie Braun , ST from [facility] per patient niece. ST not with patient now but was on Friday  Symptoms: worsening issues with swallowing solid foods. "May get stuck " at times and takes longer to swallow. Word finding difficulties , aphasia per ST.  Frequency: Friday  Pertinent Negatives: Patient denies na  Disposition: [] ED /[] Urgent Care (no appt availability in office) / [] Appointment(In office/virtual)/ []  [place] [facility]/ [] Home Care/ [] Refused Recommended Disposition /[] [place] [facility]/ [x]  Follow-up with PCP Additional Notes:   Please advise per ST note sx noted on Friday . Clydie Braun , ST not with patient now.         Reason for Disposition  [1] Swallowing difficulty AND [2] cause unknown  (Exception: Difficulty swallowing is a chronic symptom.)  Answer Assessment - Initial Assessment Questions 1. DESCRIPTION: "Tell me more about this problem." "Are you  having trouble swallowing liquids, solids, or both?" "Any trouble with swallowing saliva (spit)?"     Solids , ST not with patient now  2. SEVERITY: "How bad is the swallowing difficulty?"  (e.g., Scale 1-10; or mild, moderate, severe)   - MILD (0-3): Occasional swallowing difficulty; has trouble swallowing certain types of foods or liquids.   - MODERATE (4-7): Frequent swallowing difficulty; only able to swallow small amounts of foods and fluids.   - SEVERE (8-10): Unable to swallow any foods, fluids, or saliva; sensation of "lump in throat" or "something stuck in throat", and frequent drooling or spitting may be present.     Per niece , has issues with swallowing solids and can c/o feels like something stuck in throat  3. ONSET: "When did the swallowing problems begin?"      unknown 4. CAUSE: "What do you think is causing the problem?"  (e.g., dry mouth, food or pill stuck in throat, mouth pain, sore throat, progression of disease process such as dementia or Parkinson's  disease).      Unsure  5. CHRONIC or RECURRENT: "Is this a new problem for you?"  If No, ask: "How long have you had this problem?" (e.g., days, weeks, months)      New to therapy  6. OTHER SYMPTOMS: "Do you have any other symptoms?" (e.g., chest pain, difficulty breathing, mouth sores, sore throat, swollen tongue, chest pain)     Word finding difficulties, difficulty swallowing solids  7. PREGNANCY: "Is there any chance you are pregnant?" "When was your last menstrual period?"     na  Protocols used: Swallowing Difficulty-A-AH

## 2023-04-13 NOTE — Telephone Encounter (Signed)
We can see him sooner if they'd like

## 2023-04-13 NOTE — Telephone Encounter (Signed)
Called and gave verbal orders per Jolene.  

## 2023-04-13 NOTE — Telephone Encounter (Signed)
Patient will keep the Thursday appt

## 2023-04-13 NOTE — Telephone Encounter (Signed)
Home Health Verbal Orders - Caller/Agency: Clydie Braun from [facility] Number: [phone] Requesting speech therapy /difficulty swallowing Frequency: 1x6

## 2023-04-15 ENCOUNTER — Ambulatory Visit: Payer: Self-pay

## 2023-04-15 NOTE — Telephone Encounter (Signed)
       Chief Complaint: Nicholas Gross with [facility] reports new skin boil or open area to chest between nipple line. Open and draining . Pt. Has appointment tomorrow already. Symptoms: Above Frequency: Noted today. Pertinent Negatives: Patient denies fever Disposition: [] ED /[] Urgent Care (no appt availability in office) / [x] Appointment(In office/virtual)/ []  [place] [facility]/ [] Home Care/ [] Refused Recommended Disposition /[] [place] [facility]/ []  Follow-up with PCP Additional Notes: Pt. Has appointment.  Reason for Disposition  [1] Boil > 1/2 inch across (> 12 mm; larger than a marble) AND [2] center is soft or pus colored  Answer Assessment - Initial Assessment Questions 1. APPEARANCE of BOIL: "What does the boil look like?"      2.5 cm x 1.5 cm 2. LOCATION: "Where is the boil located?"      Chest 3. NUMBER: "How many boils are there?"      1 4. SIZE: "How big is the boil?" (e.g., inches, cm; compare to size of a coin or other object)     Above 5. ONSET: "When did the boil start?"     Today 6. PAIN: "Is there any pain?" If Yes, ask: "How bad is the pain?"   (Scale 1-10; or mild, moderate, severe)     Mild 7. FEVER: "Do you have a fever?" If Yes, ask: "What is it, how was it measured, and when did it start?"      No 8. SOURCE: "Have you been around anyone with boils or other Staph infections?" "Have you ever had boils before?"     No 9. OTHER SYMPTOMS: "Do you have any other symptoms?" (e.g., shaking chills, weakness, rash elsewhere on body)     No 10. PREGNANCY: "Is there any chance you are pregnant?" "When was your last menstrual period?"       N/a  Protocols used: Boil (Skin Abscess)-A-AH

## 2023-04-16 ENCOUNTER — Ambulatory Visit (INDEPENDENT_AMBULATORY_CARE_PROVIDER_SITE_OTHER): Payer: Medicare Other | Admitting: Family Medicine

## 2023-04-16 ENCOUNTER — Encounter: Payer: Self-pay | Admitting: Family Medicine

## 2023-04-16 VITALS — BP 161/76 | HR 86 | Ht 69.0 in | Wt 156.0 lb

## 2023-04-16 DIAGNOSIS — R600 Localized edema: Secondary | ICD-10-CM | POA: Diagnosis not present

## 2023-04-16 DIAGNOSIS — R17 Unspecified jaundice: Secondary | ICD-10-CM | POA: Diagnosis not present

## 2023-04-16 DIAGNOSIS — L03115 Cellulitis of right lower limb: Secondary | ICD-10-CM

## 2023-04-16 DIAGNOSIS — L299 Pruritus, unspecified: Secondary | ICD-10-CM | POA: Diagnosis not present

## 2023-04-16 DIAGNOSIS — R197 Diarrhea, unspecified: Secondary | ICD-10-CM

## 2023-04-16 DIAGNOSIS — L03116 Cellulitis of left lower limb: Secondary | ICD-10-CM

## 2023-04-16 NOTE — Progress Notes (Signed)
BP (!) 161/76   Pulse 86   Ht 5\' 9"  (1.753 m)   Wt 156 lb (70.8 kg)   SpO2 99%   BMI 23.04 kg/m    Subjective:    Patient ID: KEIONDRE GLOE, male    DOB: May 06, 1928, 89 y.o.   MRN: [id number]  HPI: HERMEN POELKER is a 89 y.o. male  Chief Complaint  Patient presents with   Chest Pain   Diarrhea   Blister    Patient niece says patient has a terrible ongoing issue with his blisters on legs and feet. Patient says she has noticed blisters all over such as groin area and chest area that are new along with his feet. Patient says home health is coming 3 times a week and treating it and nothing is helping him.    Dysphagia    Patient is having difficulty swallowing since his last visit. Patient niece is concerned things are getting worse and want to discuss different treatment.    Leg Pain    Patient says after the home health nurse wraps his leg, he will notice a stingy type burning sensation in his legs.   Hilliard presents today feeling significantly worse than he did 2 weeks ago at his last appointment. His legs have been swelling significantly more and he has been having open weeping wounds on his legs and his groin that are not improving with unna boots from home health. He has been on antibiotics, but it is just getting worse. He notes that he has been itching all over his body, he has sores in his groin and in his antecubital fossae. He notes that his stools have formed up a bit on the metamucil, but he is now having trouble swallowing. He notes that he feels terrible. He is very independent and he states that he wants to go to the hospital.   Relevant past medical, surgical, family and social history reviewed and updated as indicated. Interim medical history since our last visit reviewed. Allergies and medications reviewed and updated.  Review of Systems  Constitutional:  Positive for appetite change, fatigue and unexpected weight change. Negative for chills, diaphoresis and  fever.  HENT: Negative.    Respiratory: Negative.    Cardiovascular:  Positive for leg swelling. Negative for chest pain and palpitations.  Gastrointestinal:  Positive for abdominal pain, diarrhea and nausea. Negative for abdominal distention, anal bleeding, blood in stool, constipation, rectal pain and vomiting.  Genitourinary: Negative.   Musculoskeletal: Negative.   Skin:  Positive for color change, rash and wound. Negative for pallor.  Neurological: Negative.   Psychiatric/Behavioral: Negative.      Per HPI unless specifically indicated above     Objective:    BP (!) 161/76   Pulse 86   Ht 5\' 9"  (1.753 m)   Wt 156 lb (70.8 kg)   SpO2 99%   BMI 23.04 kg/m   Wt Readings from Last 3 Encounters:  04/16/23 156 lb (70.8 kg)  03/16/23 159 lb 12.8 oz (72.5 kg)  03/02/23 160 lb 3.2 oz (72.7 kg)    Physical Exam Vitals and nursing note reviewed.  Constitutional:      General: He is not in acute distress.    Appearance: Normal appearance. He is not ill-appearing, toxic-appearing or diaphoretic.  HENT:     Head: Normocephalic and atraumatic.     Right Ear: External ear normal.     Left Ear: External ear normal.     Nose: Nose  normal.     Mouth/Throat:     Mouth: Mucous membranes are moist.     Pharynx: Oropharynx is clear.  Eyes:     General: No scleral icterus.       Right eye: No discharge.        Left eye: No discharge.     Extraocular Movements: Extraocular movements intact.     Conjunctiva/sclera: Conjunctivae normal.     Pupils: Pupils are equal, round, and reactive to light.  Cardiovascular:     Rate and Rhythm: Normal rate and regular rhythm.     Pulses: Normal pulses.     Heart sounds: Murmur heard.     No friction rub. No gallop.  Pulmonary:     Effort: Pulmonary effort is normal. No respiratory distress.     Breath sounds: Normal breath sounds. No stridor. No wheezing, rhonchi or rales.  Chest:     Chest wall: No tenderness.  Musculoskeletal:         General: Normal range of motion.     Cervical back: Normal range of motion and neck supple.     Right lower leg: Tenderness present. Edema (3+ edema in unna boots) present.     Left lower leg: Tenderness present. Edema (3+ edema in unna boots) present.  Skin:    General: Skin is warm and dry.     Capillary Refill: Capillary refill takes less than 2 seconds.     Coloration: Skin is jaundiced. Skin is not pale.     Findings: No bruising, erythema, lesion or rash.     Comments: Excoriated rash on his R antecubital fossa, erythematous rash on back bilaterally, excoriated rash on bilateral dorsal hands, excoriated rash in bilateral sides of his groin  Heat on both legs- wrapped in unna boots, not unwrapped at this time.   Neurological:     General: No focal deficit present.     Mental Status: He is alert and oriented to person, place, and time. Mental status is at baseline.  Psychiatric:        Mood and Affect: Mood normal.        Behavior: Behavior normal.        Thought Content: Thought content normal.        Judgment: Judgment normal.     Results for orders placed or performed in visit on 03/02/23  Basic metabolic panel  Result Value Ref Range   Glucose 94 70 - 99 mg/dL   BUN 8 (L) 10 - 36 mg/dL   Creatinine, Ser 1.61 0.76 - 1.27 mg/dL   eGFR 68 >09 UE/AVW/0.98   BUN/Creatinine Ratio 8 (L) 10 - 24   Sodium 140 134 - 144 mmol/L   Potassium 3.0 (L) 3.5 - 5.2 mmol/L   Chloride 99 96 - 106 mmol/L   CO2 25 20 - 29 mmol/L   Calcium 8.7 8.6 - 10.2 mg/dL      Assessment & Plan:   Problem List Items Addressed This Visit       Other   Cellulitis of both lower extremities    Significantly worse now with open wounds. Will get him to the ER for evaluation and ?IV antibioitics.       Other Visit Diagnoses     Jaundice    -  Primary   CT abdomen normal about a month ago- significant concern for pancreatic neoplasm given itching and jaundice. Will send to ER.   Peripheral edema  Not under good control, worsening even with UNNA boots- will send to ER.   Itching       Concern for neoplasm vs insect infestation. Will send to ER. Await results.   Diarrhea, unspecified type       Improved on metamucil and probiotics.        Follow up plan: Return After hospitalization.[name]

## 2023-04-16 NOTE — Assessment & Plan Note (Signed)
Significantly worse now with open wounds. Will get him to the ER for evaluation and ?IV antibioitics.

## 2023-04-17 DIAGNOSIS — R251 Tremor, unspecified: Secondary | ICD-10-CM | POA: Insufficient documentation

## 2023-04-17 DIAGNOSIS — R471 Dysarthria and anarthria: Secondary | ICD-10-CM | POA: Insufficient documentation

## 2023-05-18 ENCOUNTER — Telehealth: Payer: Self-pay | Admitting: Family Medicine

## 2023-05-18 NOTE — Telephone Encounter (Signed)
Home Health Verbal Orders - Caller/Agency: Marcelino Duster from [facility] Number:  [phone] Service Requested: OT Frequency: says will start seeing pt for OT /2x3 1x1  Any new concerns about the patient? no

## 2023-05-18 NOTE — Telephone Encounter (Signed)
OK for verbal orders?

## 2023-05-18 NOTE — Telephone Encounter (Signed)
Called and gave verbal orders per [name].

## 2023-05-27 ENCOUNTER — Telehealth: Payer: Self-pay | Admitting: Family Medicine

## 2023-05-27 NOTE — Telephone Encounter (Signed)
Called number provided and request that orders be resent.

## 2023-05-27 NOTE — Telephone Encounter (Signed)
Copied from CRM [id number]. Topic: General - Other >> May 27, 2023  3:01 PM Marlow Baars wrote: Reason for CRM: Diannia Ruder the niece of the patient called in stating [facility] faxed orders over to the provider to be signed on 11/27 and they still have not received them. Please assist further. [facility] number is [phone]

## 2023-05-28 ENCOUNTER — Telehealth: Payer: Self-pay | Admitting: Family Medicine

## 2023-05-28 NOTE — Telephone Encounter (Signed)
OK for verbal orders?

## 2023-05-28 NOTE — Telephone Encounter (Signed)
Copied from CRM [id number]. Topic: General - Other >> May 28, 2023  2:45 PM Frutoso Chase wrote: Home Health Verbal Orders - Caller/Agency: [facility]  Callback Number: [phone], Secured line  Service Requested: Skilled Nursing Frequency: 1 week 6  Any new concerns about the patient? No

## 2023-05-28 NOTE — Telephone Encounter (Signed)
Left message for Nicholas Gross from [facility] to provide verbal OK orders per Dr Laural Benes. Advised to give our office a call back if she has any questions or concerns.

## 2023-06-01 ENCOUNTER — Ambulatory Visit (INDEPENDENT_AMBULATORY_CARE_PROVIDER_SITE_OTHER): Payer: Medicare Other | Admitting: Family Medicine

## 2023-06-01 ENCOUNTER — Encounter: Payer: Self-pay | Admitting: Family Medicine

## 2023-06-01 VITALS — BP 122/63 | HR 70 | Ht 69.0 in | Wt 141.6 lb

## 2023-06-01 DIAGNOSIS — R296 Repeated falls: Secondary | ICD-10-CM

## 2023-06-01 DIAGNOSIS — R1310 Dysphagia, unspecified: Secondary | ICD-10-CM

## 2023-06-01 DIAGNOSIS — Z23 Encounter for immunization: Secondary | ICD-10-CM

## 2023-06-01 DIAGNOSIS — R251 Tremor, unspecified: Secondary | ICD-10-CM | POA: Diagnosis not present

## 2023-06-01 DIAGNOSIS — L139 Bullous disorder, unspecified: Secondary | ICD-10-CM | POA: Diagnosis not present

## 2023-06-01 DIAGNOSIS — R410 Disorientation, unspecified: Secondary | ICD-10-CM

## 2023-06-01 DIAGNOSIS — K6389 Other specified diseases of intestine: Secondary | ICD-10-CM | POA: Insufficient documentation

## 2023-06-01 NOTE — Progress Notes (Signed)
BP 122/63   Pulse 70   Ht 5\' 9"  (1.753 m)   Wt 141 lb 9.6 oz (64.2 kg)   SpO2 96%   BMI 20.91 kg/m    Subjective:    Patient ID: Nicholas Gross, male    DOB: [birth year], 89 y.o.   MRN: [id number]  HPI: Nicholas Gross is a 89 y.o. male  Chief Complaint  Patient presents with   Hospitalization Follow-up    Patient niece has questions in regards to patient's medications since discharge.    Bleeding/Bruising    Patient has an bruise area on his L arm. Patient niece noticed area at today's visit.   Transition of Care Hospital Follow up.   Hospital/Facility: [facility]  D/C Physician: Dr. Andres Shad D/C Date: 04/24/23  Records Requested: 06/01/23 Records Received: 06/01/23 Records Reviewed: 06/01/23  Diagnoses on Discharge: Blistering Rash, Dysphagia, Tremor, Dysarthria, Mesenteric Mass, Multiple Falls, HTN  Date of interactive Contact within 48 hours of discharge: NOT DONE  Date of 7 day or 14 day face-to-face visit: 06/01/23 NOT within 14 days  Outpatient Encounter Medications as of 06/01/2023  Medication Sig   clobetasol ointment (TEMOVATE) 0.05 % Apply the medication twice daily to stubborn areas of the skin until smooth. Then stop and re-start as the skin changes come back.   cyanocobalamin (VITAMIN B12) 250 MCG tablet Take by mouth.   furosemide (LASIX) 40 MG tablet Take 1 tablet (40 mg total) by mouth daily.   gabapentin (NEURONTIN) 100 MG capsule 100 capsule AS NEEDED (route: oral)   hydrOXYzine (VISTARIL) 25 MG capsule TAKE 1 CAPSULE BY MOUTH EVERY 8 HOURS ASNEEDED FOR ITCHING   lisinopril (ZESTRIL) 5 MG tablet Take 1 tablet (5 mg total) by mouth daily.   Multiple Vitamins-Minerals (PRESERVISION AREDS PO) Take 1 tablet by mouth 2 (two) times daily.   potassium chloride SA (KLOR-CON M) 20 MEQ tablet Take by mouth.   predniSONE (DELTASONE) 10 MG tablet Take by mouth.   Probiotic Product (PROBIOTIC BLEND) CAPS As directed   psyllium (METAMUCIL SMOOTH TEXTURE) 58.6  % powder Take 1 packet by mouth daily.   rosuvastatin (CRESTOR) 10 MG tablet Take 1 tablet (10 mg total) by mouth daily.   White Petrolatum (WHITE PETROLEUM JELLY) GEL Apply topically.   triamcinolone ointment (KENALOG) 0.5 % Apply 1 Application topically 2 (two) times daily. (Patient not taking: Reported on 06/01/2023)   [DISCONTINUED] cyanocobalamin (VITAMIN B12) 1000 MCG tablet Take 1,000 mcg by mouth daily.   No facility-administered encounter medications on file as of 06/01/2023.  Per Hospitalist: "Likely Bullous pemphigoid, improvingSuperimposed SSTI Biopsy results with findings that favor bullous pemphigoid, though an inflammatory variant of epidermolysis bullosa acquisita cannot be ruled out. Serum IgA level normal, aerobic cx 4+ mixed gram positives and gram negatives. 4+ mixed anaerobes grown on cx too. Bcx Ng4d. Antibiotics for superimposed SSTI. Rash has improved on steroids. SSTI treated with antibiotics, finished course today.  -Dermatology Follow up outpatient  -Continue pred taper: 40mg  daily x 7 days (10/28-11/4), 30mg  x 7 days (11/5-11/11), 20 mg daily x 7 days (11/12-11/18), 10 mg x 7 days (11/19-11/25) -S/p Unasyn + Doxycyline -S/p Vanc/Zosyn (10/25-10/27) -S/p Unasyn/doxy (10/27-10/31) -Avoid any adhesives -Monitor renal function closely  -Wet Wrap Instructions per derm   Soft Tissue Mesenteric Mass CT abdomen/pelvis notable for calcified soft tissue mesenteric mass measuring up to 4.1 cm with diffuse surrounding mesenteric stranding. Retroperitoneal adenopathy also noted. Per radiology differential for mass includes neuroendocrine tumor vs desmoid tumor. Discussed  with oncology and workup would be outpatient dotatate PET/CT. Patient and family decided they did not want to pursue further workup at this time.  -Outpatient oncology referral   Dysphagia 49-month history of progressive dysphagia to solids. No dysphagia to liquids or odynophagia, no significant weight loss. There  is a question of mechanical cause of sx including stricture vs ring/web vs other. SLP evaluated, recommended regular solid/thin liquid diet with aspiration precautions. Recommend GI evaluation as an outpatient given that its non-contributory to his current presentation -SLP eval: regular diet, aspiration precautions -Nutrition Following  -Outpatient GI referral for possible EGD   Altered mental status  Dysarthria  Tremor  Fall Patient has 1 year history of difficulty with word recall, difficulty with getting his words out, trouble with balance, and frequent falls. Has also had right hand/arm tremor for the last 6 months that is typically there at rest but not always and does get worse with movement. Niece has noticed his voice is softer than it used to be. He has no known history of stroke. CTH in ED was negative. Last bMRI was June 2023 and was unremarkable. Patient does not carry a formal diagnosis of any neurological condition. His mental status has continued to wax and wane in the hospital, likely multifactorial delirium iso infection, hospitalization, medications. Overall seems to be less agitated after starting scheduled Seroquel and has not required any more prn Haldol. As of 10/29, niece reports that he is near baseline mental status.  -Continue Seroquel 12.5mg  nightly, chosen to minimize anti-dopaminergic profile -PRN Haldol 0.5 mg for combativeness -outpatient neurology evaluation as he has some features c/w Parkinsonism -PT/OT -Delirium precautions  Chronic Problems ONG:EXBM Lisinopril 5mg  WUX:LKGM Crestor equivalent  WNU:UVOZ ASA 81mg  BLE Edema:Home Lasix 40mg   The patient's presentation is complicated by the following clinically significant conditions requiring additional evaluation and treatment: - Malnutrition POA requiring further investigation, treatment, or monitoring - Age related debility POA requiring additional resources: DME, PT, or OT"   Diagnostic Tests Reviewed:   EXAM: Computed tomography, head or brain without contrast material. ACCESSION: [id number] [facility]   CLINICAL INDICATION: 89 years old Male with Fall    COMPARISON: None available  TECHNIQUE: Axial CT images of the head  from skull base to vertex without contrast.  FINDINGS: Age-related global cerebral volume loss. No findings to suggest acute large territory infarct or acute intracranial hemorrhage. Ventricles are normal in size. No midline shift or mass effect. Vertebral artery and carotid siphon calcifications. Visualized mastoid air cells and paranasal sinuses are pneumatized. No fracture. Bilateral lens replacement. Procedure Note  Gardiner Sleeper, MD - 04/16/2023 Formatting of this note might be different from the original. EXAM: Computed tomography, head or brain without contrast material. ACCESSION: [id number] [facility]   CLINICAL INDICATION: 89 years old Male with Fall    COMPARISON: None available  TECHNIQUE: Axial CT images of the head  from skull base to vertex without contrast.  FINDINGS: Age-related global cerebral volume loss. No findings to suggest acute large territory infarct or acute intracranial hemorrhage. Ventricles are normal in size. No midline shift or mass effect. Vertebral artery and carotid siphon calcifications. Visualized mastoid air cells and paranasal sinuses are pneumatized. No fracture. Bilateral lens replacement.  IMPRESSION: -No acute intracranial abnormality.  -Calcified soft tissue mesenteric mass measuring up to 4.1 cm with diffuse surrounding mesenteric stranding. Differential for this mass includes neuroendocrine (favored given adenopathy) versus desmoid tumor. -Retroperitoneal adenopathy, as above. Findings are nonspecific but suggestive of malignancy. Narrative  EXAM: CT ABDOMEN PELVIS  W CONTRAST ACCESSION: [id number] [facility] CLINICAL INDICATION: 89 years old with Concern for mass per review of clinical chart, the patient reports a distended abdomen  as well as abdominal pain with possible jaundice.  COMPARISON: None  TECHNIQUE: A helical CT scan of the abdomen and pelvis was obtained following IV contrast from the lung bases through the pubic symphysis. Images were reconstructed in the axial plane. Coronal and sagittal reformatted images were also provided for further evaluation.  FINDINGS:  LOWER CHEST: Cardiomegaly with moderate to severe coronary artery calcifications.  LIVER: Normal liver contour. Scattered subcentimeter hypodensities that are too small for characterization by CT.  BILIARY: Punctate calcification likely representing cholelithiasis without gallbladder wall thickening or pericholecystic fluid. No biliary ductal dilatation.    SPLEEN: Normal in size and contour.  PANCREAS: Diffusely, mildly atrophic pancreas.  No focal lesions. Mild ductal dilation.  ADRENAL GLANDS: Normal appearance of the adrenal glands.  KIDNEYS/URETERS: Symmetric renal enhancement.  No hydronephrosis.  No solid renal mass.  BLADDER: Unremarkable.  REPRODUCTIVE ORGANS: Prostatomegaly measuring up to 6.1 cm with calcifications. There is demonstrated mass effect of the prostate onto the bladder. Nonspecific punctate calcification in the left epididymis. Bilateral hydroceles.  GI TRACT: No findings of bowel obstruction or acute inflammation. Colonic diverticulosis. Normal appendix.  PERITONEUM, RETROPERITONEUM AND MESENTERY: There is a soft tissue mass within the mesentery at the level transverse colon measuring up to 3.3 x 4.1 cm with centrally located calcifications and irregular borders. (3:57). There is diffuse stranding and haziness to the mesentery suggestive of widespread inflammation. Otherwise, there is no free air, ascites, or fluid collection.    LYMPH NODES: Multiple enlarged inguinal and external iliac lymph nodes. Representative lymph nodes include a 1.6 cm right common iliac node (3:75) and a 1.1 cm left common iliac node  (3:64).  VESSELS: Hepatic and portal veins are patent.  Normal caliber aorta with moderate to severe calcifications extending to the distal branches.    BONES and SOFT TISSUES: Osseous demineralization. No aggressive osseous lesions.  No focal soft tissue lesions. Fatty atrophy of the bilateral deep gluteal muscles. Moderate to severe lumbar degenerative disc disease with compression deformity of the L1 vertebral body. Additionally, there is mild lumbar dextroscoliosis with right lateral listhesis of L3 on L4 and left lateral listhesis of L4 on L5. Remote fracture of the right pubic ramus and anterior acetabulum. Procedure Note  Karleen Hampshire Wilhelmenia Blase, MD - 04/16/2023 Formatting of this note might be different from the original. EXAM: CT ABDOMEN PELVIS W CONTRAST ACCESSION: [id number] [facility] CLINICAL INDICATION: 89 years old with Concern for mass per review of clinical chart, the patient reports a distended abdomen as well as abdominal pain with possible jaundice.  COMPARISON: None  TECHNIQUE: A helical CT scan of the abdomen and pelvis was obtained following IV contrast from the lung bases through the pubic symphysis. Images were reconstructed in the axial plane. Coronal and sagittal reformatted images were also provided for further evaluation.  FINDINGS:  LOWER CHEST: Cardiomegaly with moderate to severe coronary artery calcifications.  LIVER: Normal liver contour. Scattered subcentimeter hypodensities that are too small for characterization by CT.  BILIARY: Punctate calcification likely representing cholelithiasis without gallbladder wall thickening or pericholecystic fluid. No biliary ductal dilatation.    SPLEEN: Normal in size and contour.  PANCREAS: Diffusely, mildly atrophic pancreas.  No focal lesions. Mild ductal dilation.  ADRENAL GLANDS: Normal appearance of the adrenal glands.  KIDNEYS/URETERS: Symmetric renal enhancement.  No hydronephrosis.  No solid renal  mass.  BLADDER: Unremarkable.  REPRODUCTIVE ORGANS: Prostatomegaly measuring up to 6.1 cm with calcifications. There is demonstrated mass effect of the prostate onto the bladder. Nonspecific punctate calcification in the left epididymis. Bilateral hydroceles.  GI TRACT: No findings of bowel obstruction or acute inflammation. Colonic diverticulosis. Normal appendix.  PERITONEUM, RETROPERITONEUM AND MESENTERY: There is a soft tissue mass within the mesentery at the level transverse colon measuring up to 3.3 x 4.1 cm with centrally located calcifications and irregular borders. (3:57). There is diffuse stranding and haziness to the mesentery suggestive of widespread inflammation. Otherwise, there is no free air, ascites, or fluid collection.    LYMPH NODES: Multiple enlarged inguinal and external iliac lymph nodes. Representative lymph nodes include a 1.6 cm right common iliac node (3:75) and a 1.1 cm left common iliac node (3:64).  VESSELS: Hepatic and portal veins are patent.  Normal caliber aorta with moderate to severe calcifications extending to the distal branches.    BONES and SOFT TISSUES: Osseous demineralization. No aggressive osseous lesions.  No focal soft tissue lesions. Fatty atrophy of the bilateral deep gluteal muscles. Moderate to severe lumbar degenerative disc disease with compression deformity of the L1 vertebral body. Additionally, there is mild lumbar dextroscoliosis with right lateral listhesis of L3 on L4 and left lateral listhesis of L4 on L5. Remote fracture of the right pubic ramus and anterior acetabulum.  IMPRESSION: -Calcified soft tissue mesenteric mass measuring up to 4.1 cm with diffuse surrounding mesenteric stranding. Differential for this mass includes neuroendocrine (favored given adenopathy) versus desmoid tumor. -Retroperitoneal adenopathy, as above. Findings are nonspecific but suggestive of malignancy.  Disposition: Home with home health  Consults:  Dermatology, neurology  Discharge Instructions: Follow up here, with dermatology, neurology and GI  Disease/illness Education: Discussed today  Home Health/Community Services Discussions/Referrals: In place  Establishment or re-establishment of referral orders for community resources: In place  Discussion with other health care providers: N/A  Assessment and Support of treatment regimen adherence: Good  Appointments Coordinated with: Patient and Niece  Education for self-management, independent living, and ADLs:  Discussed today  Since getting out of the hospital, Nicholas Gross has been feeling OK. He has been following with dermatology. He has not been having his legs wet wrapped currently. His legs are red again today, has had a few burst blisters on his legs. He is back on his prednisone. Home health has not started yet. His niece notes that he also needs help bathing and is asking if we can get someone out with home health to help with that.   Relevant past medical, surgical, family and social history reviewed and updated as indicated. Interim medical history since our last visit reviewed. Allergies and medications reviewed and updated.  Review of Systems  Constitutional: Negative.   Respiratory: Negative.    Cardiovascular: Negative.   Gastrointestinal: Negative.   Musculoskeletal: Negative.   Skin:  Positive for color change and wound. Negative for pallor and rash.  Neurological:  Positive for tremors, speech difficulty and weakness. Negative for dizziness, seizures, syncope, facial asymmetry, light-headedness, numbness and headaches.  Psychiatric/Behavioral: Negative.      Per HPI unless specifically indicated above     Objective:    BP 122/63   Pulse 70   Ht 5\' 9"  (1.753 m)   Wt 141 lb 9.6 oz (64.2 kg)   SpO2 96%   BMI 20.91 kg/m   Wt Readings from Last 3 Encounters:  06/01/23 141 lb 9.6 oz (64.2 kg)  04/16/23 156 lb (70.8 kg)  03/16/23 159 lb 12.8 oz (72.5 kg)     Physical Exam Vitals and nursing note reviewed.  Constitutional:      General: He is not in acute distress.    Appearance: Normal appearance. He is normal weight. He is not ill-appearing, toxic-appearing or diaphoretic.  HENT:     Head: Normocephalic and atraumatic.     Right Ear: External ear normal.     Left Ear: External ear normal.     Nose: Nose normal.     Mouth/Throat:     Mouth: Mucous membranes are moist.     Pharynx: Oropharynx is clear.  Eyes:     General: No scleral icterus.       Right eye: No discharge.        Left eye: No discharge.     Extraocular Movements: Extraocular movements intact.     Conjunctiva/sclera: Conjunctivae normal.     Pupils: Pupils are equal, round, and reactive to light.  Cardiovascular:     Rate and Rhythm: Normal rate and regular rhythm.     Pulses: Normal pulses.     Heart sounds: Murmur heard.     No friction rub. No gallop.  Pulmonary:     Effort: Pulmonary effort is normal. No respiratory distress.     Breath sounds: Normal breath sounds. No stridor. No wheezing, rhonchi or rales.  Chest:     Chest wall: No tenderness.  Musculoskeletal:        General: Normal range of motion.     Cervical back: Normal range of motion and neck supple.  Skin:    General: Skin is warm and dry.     Capillary Refill: Capillary refill takes less than 2 seconds.     Coloration: Skin is not jaundiced or pale.     Findings: No bruising, erythema, lesion or rash.     Comments: Open bullae on bilateral lower legs. Hyperpigmented skin on lower legs. No swelling. No heat, no tenderness.   Neurological:     General: No focal deficit present.     Mental Status: He is alert and oriented to person, place, and time. Mental status is at baseline.  Psychiatric:        Mood and Affect: Mood normal.        Behavior: Behavior normal.        Thought Content: Thought content normal.        Judgment: Judgment normal.     Results for orders placed or performed in  visit on 03/02/23  Basic metabolic panel  Result Value Ref Range   Glucose 94 70 - 99 mg/dL   BUN 8 (L) 10 - 36 mg/dL   Creatinine, Ser 0.98 0.76 - 1.27 mg/dL   eGFR 68 >11 BJ/YNW/2.95   BUN/Creatinine Ratio 8 (L) 10 - 24   Sodium 140 134 - 144 mmol/L   Potassium 3.0 (L) 3.5 - 5.2 mmol/L   Chloride 99 96 - 106 mmol/L   CO2 25 20 - 29 mmol/L   Calcium 8.7 8.6 - 10.2 mg/dL      Assessment & Plan:   Problem List Items Addressed This Visit       Other   Bullous disorder - Primary    Continue to follow with dermatology- started to flare again after prednisone. Considering dupixent. Needs to have wet wraps by home health- we will confirm frequency with dermatology and make sure home health has the instructions. Call with any concerns. Continue to monitor.  Relevant Orders   CBC with Differential/Platelet   Basic metabolic panel   Mesenteric mass    Family is unsure about treatment, but would like to consider PET scan vs discussion of what is going on. Will refer to oncology. Call with any concerns.       Relevant Orders   Ambulatory referral to Hematology / Oncology   Other Visit Diagnoses     Dysphagia, unspecified type       Will refer to GI. Await their input.   Relevant Orders   CBC with Differential/Platelet   Basic metabolic panel   Ambulatory referral to Gastroenterology   Delirium       Resolved. Concern at the hospital for ? Parkinson's. Will get her back into neurology- new referral placed today. Await their input.   Relevant Orders   CBC with Differential/Platelet   Basic metabolic panel   Ambulatory referral to Neurology   Tremor       Concern at the hospital for ? Parkinson's. Will get her back into neurology- new referral placed today. Await their input.   Relevant Orders   CBC with Differential/Platelet   Basic metabolic panel   Ambulatory referral to Neurology   Repeated falls       Concern at the hospital for ? Parkinson's. Will get her back  into neurology- new referral placed today. Await their input. Home PT in place.   Relevant Orders   CBC with Differential/Platelet   Basic metabolic panel   Ambulatory referral to Neurology   Needs flu shot       Flu shot given today.   Relevant Orders   Flu Vaccine Trivalent High Dose (Fluad) (Completed)        Follow up plan: Return in about 5 weeks (around 07/06/2023).   >40 minutes spent with patient and niece today

## 2023-06-01 NOTE — Assessment & Plan Note (Signed)
Family is unsure about treatment, but would like to consider PET scan vs discussion of what is going on. Will refer to oncology. Call with any concerns.

## 2023-06-01 NOTE — Assessment & Plan Note (Addendum)
Continue to follow with dermatology- started to flare again after prednisone. Considering dupixent. Needs to have wet wraps by home health- we will confirm frequency with dermatology and make sure home health has the instructions. Call with any concerns. Continue to monitor.

## 2023-06-02 ENCOUNTER — Telehealth: Payer: Self-pay

## 2023-06-02 ENCOUNTER — Telehealth: Payer: Self-pay | Admitting: Family Medicine

## 2023-06-02 LAB — CBC WITH DIFFERENTIAL/PLATELET
Basophils Absolute: 0 10*3/uL (ref 0.0–0.2)
Basos: 0 %
EOS (ABSOLUTE): 0 10*3/uL (ref 0.0–0.4)
Eos: 0 %
Hematocrit: 35.4 % — ABNORMAL LOW (ref 37.5–51.0)
Hemoglobin: 11.5 g/dL — ABNORMAL LOW (ref 13.0–17.7)
Immature Grans (Abs): 0.1 10*3/uL (ref 0.0–0.1)
Immature Granulocytes: 1 %
Lymphocytes Absolute: 0.6 10*3/uL — ABNORMAL LOW (ref 0.7–3.1)
Lymphs: 6 %
MCH: 30.1 pg (ref 26.6–33.0)
MCHC: 32.5 g/dL (ref 31.5–35.7)
MCV: 93 fL (ref 79–97)
Monocytes Absolute: 0.3 10*3/uL (ref 0.1–0.9)
Monocytes: 3 %
Neutrophils Absolute: 9.1 10*3/uL — ABNORMAL HIGH (ref 1.4–7.0)
Neutrophils: 90 %
Platelets: 220 10*3/uL (ref 150–450)
RBC: 3.82 x10E6/uL — ABNORMAL LOW (ref 4.14–5.80)
RDW: 14.7 % (ref 11.6–15.4)
WBC: 10.1 10*3/uL (ref 3.4–10.8)

## 2023-06-02 LAB — BASIC METABOLIC PANEL
BUN/Creatinine Ratio: 24 (ref 10–24)
BUN: 31 mg/dL (ref 10–36)
CO2: 21 mmol/L (ref 20–29)
Calcium: 9.2 mg/dL (ref 8.6–10.2)
Chloride: 101 mmol/L (ref 96–106)
Creatinine, Ser: 1.27 mg/dL (ref 0.76–1.27)
Glucose: 106 mg/dL — ABNORMAL HIGH (ref 70–99)
Potassium: 5.1 mmol/L (ref 3.5–5.2)
Sodium: 142 mmol/L (ref 134–144)
eGFR: 53 mL/min/{1.73_m2} — ABNORMAL LOW (ref >59)

## 2023-06-02 NOTE — Telephone Encounter (Signed)
Left a detailed message for [facility] in regards to clarification of patient's current orders for Home Health per Dr Laural Benes.   OK for [facility] to gather clarification if nurse calls back.

## 2023-06-02 NOTE — Telephone Encounter (Signed)
-----   Message from Olevia Perches sent at 06/01/2023  3:40 PM EST ----- Can we check with his dermatology if they want the wet wraps 2x a day or 2x a week- giving orders to home health and need to check   [facility]- Dr. Andres Labrum [address] [place] [address]  Phone: tel:[phone]  fax:[phone]

## 2023-06-02 NOTE — Telephone Encounter (Signed)
Robin, OT with [facility], had her visit with patient and states that on Saturday, 12.7.24, patient had a loss of balance/near fall near the toilet/bath tub area. Patient had a visit with PCP yesterday, 12.9.24 and Zella Ball wanted to make sure PCP was aware of this so everyone is on the same page.  Robin's call back #  [phone]

## 2023-06-02 NOTE — Telephone Encounter (Signed)
Noted  

## 2023-06-03 ENCOUNTER — Telehealth: Payer: Self-pay

## 2023-06-03 NOTE — Telephone Encounter (Signed)
Spoke with representative who took a telephone call note to send to the resident who was working with provider at the patient's last visit. She says they typically have a 24-hour turn around time before the respond. Verbalized understanding.

## 2023-06-03 NOTE — Telephone Encounter (Signed)
-----   Message from Olevia Perches sent at 06/01/2023  3:40 PM EST ----- Can we check with his dermatology if they want the wet wraps 2x a day or 2x a week- giving orders to home health and need to check   [facility]- Dr. Andres Labrum [address] [place] [address]  Phone: tel:[phone]  fax:[phone]

## 2023-06-05 ENCOUNTER — Inpatient Hospital Stay: Payer: Medicare Other

## 2023-06-05 ENCOUNTER — Telehealth: Payer: Self-pay | Admitting: Family Medicine

## 2023-06-05 ENCOUNTER — Inpatient Hospital Stay: Payer: Medicare Other | Attending: Oncology | Admitting: Oncology

## 2023-06-05 ENCOUNTER — Encounter: Payer: Self-pay | Admitting: Oncology

## 2023-06-05 VITALS — BP 133/67 | HR 63 | Resp 18 | Ht 69.0 in | Wt 144.0 lb

## 2023-06-05 DIAGNOSIS — Z79899 Other long term (current) drug therapy: Secondary | ICD-10-CM | POA: Insufficient documentation

## 2023-06-05 DIAGNOSIS — R1310 Dysphagia, unspecified: Secondary | ICD-10-CM | POA: Insufficient documentation

## 2023-06-05 DIAGNOSIS — Z803 Family history of malignant neoplasm of breast: Secondary | ICD-10-CM | POA: Insufficient documentation

## 2023-06-05 DIAGNOSIS — Z801 Family history of malignant neoplasm of trachea, bronchus and lung: Secondary | ICD-10-CM | POA: Diagnosis not present

## 2023-06-05 DIAGNOSIS — R978 Other abnormal tumor markers: Secondary | ICD-10-CM | POA: Diagnosis not present

## 2023-06-05 DIAGNOSIS — Z87891 Personal history of nicotine dependence: Secondary | ICD-10-CM | POA: Diagnosis not present

## 2023-06-05 DIAGNOSIS — R634 Abnormal weight loss: Secondary | ICD-10-CM | POA: Insufficient documentation

## 2023-06-05 DIAGNOSIS — R935 Abnormal findings on diagnostic imaging of other abdominal regions, including retroperitoneum: Secondary | ICD-10-CM | POA: Diagnosis not present

## 2023-06-05 DIAGNOSIS — K6389 Other specified diseases of intestine: Secondary | ICD-10-CM | POA: Insufficient documentation

## 2023-06-05 DIAGNOSIS — D72810 Lymphocytopenia: Secondary | ICD-10-CM

## 2023-06-05 LAB — LACTATE DEHYDROGENASE: LDH: 168 U/L (ref 98–192)

## 2023-06-05 LAB — HEPATIC FUNCTION PANEL
ALT: 22 U/L (ref 0–44)
AST: 20 U/L (ref 15–41)
Albumin: 4.4 g/dL (ref 3.5–5.0)
Alkaline Phosphatase: 58 U/L (ref 38–126)
Bilirubin, Direct: 0.2 mg/dL (ref 0.0–0.2)
Indirect Bilirubin: 0.9 mg/dL (ref 0.3–0.9)
Total Bilirubin: 1.1 mg/dL (ref <1.2)
Total Protein: 6.8 g/dL (ref 6.5–8.1)

## 2023-06-05 LAB — CBC (CANCER CENTER ONLY)
HCT: 39.4 % (ref 39.0–52.0)
Hemoglobin: 13.1 g/dL (ref 13.0–17.0)
MCH: 30.5 pg (ref 26.0–34.0)
MCHC: 33.2 g/dL (ref 30.0–36.0)
MCV: 91.8 fL (ref 80.0–100.0)
Platelet Count: 255 10*3/uL (ref 150–400)
RBC: 4.29 MIL/uL (ref 4.22–5.81)
RDW: 16 % — ABNORMAL HIGH (ref 11.5–15.5)
WBC Count: 11.9 10*3/uL — ABNORMAL HIGH (ref 4.0–10.5)
nRBC: 0 % (ref 0.0–0.2)

## 2023-06-05 NOTE — Progress Notes (Signed)
Hematology/Oncology Consult Note [phone] [phone]     REFERRING PROVIDER: Dorcas Carrow, DO    CHIEF COMPLAINTS/PURPOSE OF CONSULTATION:  Mesenteric mass  ASSESSMENT & PLAN:   Mesenteric mass CT findings were reviewed and discussed with patient and niece. New mesenteric mass within 6 weeks, infectious versus aggressive malignancy. Patient's and niece are interested in pursuing imaging workup.  Patient may not be interested in proceeding with aggressive diagnostic procedures and treatments. Recommend to obtain PET scan for further evaluation. Check CBC, chemistries, chromogranin A, CEA, CA 19-9, flow cytometry, LDH  Dysphagia Patient declines gastroenterology workup.   Orders Placed This Encounter  Procedures   NM PET Image Restag (PS) Skull Base To Thigh    Standing Status:   Future    Expiration Date:   06/04/2024    If indicated for the ordered procedure, I authorize the administration of a radiopharmaceutical per Radiology protocol:   Yes    Preferred imaging location?:   [facility]   NM PET Image Initial (PI) Skull Base To Thigh    Standing Status:   Future    Expected Date:   06/12/2023    Expiration Date:   06/04/2024    If indicated for the ordered procedure, I authorize the administration of a radiopharmaceutical per Radiology protocol:   Yes    Preferred imaging location?:   [facility]   Hepatic function panel    Standing Status:   Future    Number of Occurrences:   1    Expected Date:   06/05/2023    Expiration Date:   06/04/2024   Chromogranin A    Standing Status:   Future    Number of Occurrences:   1    Expected Date:   06/05/2023    Expiration Date:   06/04/2024   CEA    Standing Status:   Future    Number of Occurrences:   1    Expected Date:   06/05/2023    Expiration Date:   06/04/2024   Cancer antigen 19-9    Standing Status:   Future    Number of Occurrences:   1    Expected Date:   06/05/2023     Expiration Date:   06/04/2024   Flow cytometry panel-leukemia/lymphoma work-up    Standing Status:   Future    Number of Occurrences:   1    Expected Date:   06/05/2023    Expiration Date:   06/04/2024   Lactate dehydrogenase    Standing Status:   Future    Number of Occurrences:   1    Expected Date:   06/05/2023    Expiration Date:   06/04/2024   CBC (Cancer Center Only)    Standing Status:   Future    Number of Occurrences:   1    Expected Date:   06/05/2023    Expiration Date:   06/04/2024   Ambulatory referral to Radiation Oncology    Referral Priority:   Routine    Referral Type:   Consultation    Referral Reason:   Specialty Services Required    Requested Specialty:   Radiation Oncology    Number of Visits Requested:   1   Follow-up after PET scan. All questions were answered. The patient knows to call the clinic with any problems, questions or concerns.  [name], MD, PhD [facility] Hematology Oncology 06/05/2023    HISTORY OF PRESENTING ILLNESS:  Nicholas Gross 89 y.o.  male presents to establish care for Mesenteric mass  Patient is a poor historian. He reports hardening in the stomach area. He reported episodes of dysphagia, particularly with dry food, which has been noticed over the past three to four months. The patient also experienced a period of diarrhea lasting two to three weeks, which has since resolved.   03/02/2023 CT abdomen pelvis with contrast showed no findings to explain diarrhea.  No colitis or diverticulitis.  Moderately enlarged prostate gland without specific feature.  Small gallstone in the gallbladder.  Old.  Minor compression deformities of L1 and L3.  04/16/2023, patient was admitted to [facility] for bullous pemphigoid.  Abdominal distention was noticed then patient had another CT abdomen pelvis with contrast. -Calcified soft tissue mesenteric mass measuring up to 4.1 cm with diffuse surrounding mesenteric stranding. Differential for this mass  includes neuroendocrine (favored given adenopathy) versus desmoid tumor.  -Retroperitoneal adenopathy, as above. Findings are nonspecific but suggestive of malignancy.   The patient has also experienced weight loss of approximately 10 pounds since his hospital admission. Despite this, he reported a good appetite. The patient lives alone and is largely independent, although he requires assistance with bathing and has some difficulty with certain tasks such as using the microwave. He ambulates with a walker and is able to manage toileting and dressing independently.  The patient has a history of smoking, with a pack a day habit, but quit approximately 45 years ago. He is currently on a tapering course of prednisone for his skin bullous pemphigoid , which flares up when steroid stopped. He also uses clobetasol for his skin.  MEDICAL HISTORY:  Past Medical History:  Diagnosis Date   CAD (coronary artery disease)    History of GI bleed 2008   History of tobacco use    17 pack years, quit around 1970   Hyperlipidemia    Hypertension    Overweight     SURGICAL HISTORY: Past Surgical History:  Procedure Laterality Date   CAROTID ENDARTERECTOMY Left 2010   CATARACT EXTRACTION     cypher stent  09/12/02   s/p cypher stent mid-LAD   TENDON REPAIR  1991   finger and arm    SOCIAL HISTORY: Social History   Socioeconomic History   Marital status: Widowed    Spouse name: Not on file   Number of children: Not on file   Years of education: some college in [employer]    Highest education level: High school graduate  Occupational History   Occupation: retired   Tobacco Use   Smoking status: Former    Current packs/day: 0.00    Average packs/day: 1 pack/day for 17.0 years (17.0 ttl pk-yrs)    Types: Cigarettes    Start date: 06/24/1951    Quit date: 06/23/1968    Years since quitting: 54.9   Smokeless tobacco: Never  Vaping Use   Vaping status: Never Used  Substance and Sexual Activity    Alcohol use: No   Drug use: No   Sexual activity: Not Currently  Other Topics Concern   Not on file  Social History Narrative   Attends church, neighbor take him    Social Drivers of Health   Financial Resource Strain: Low Risk  (04/20/2023)   [facility]   Overall Financial Resource Strain (CARDIA)    Difficulty of Paying Living Expenses: Not very hard  Food Insecurity: No Food Insecurity (06/05/2023)   Hunger Vital Sign    Worried About Running Out  of Food in the Last Year: Never true    Ran Out of Food in the Last Year: Never true  Transportation Needs: No Transportation Needs (06/05/2023)   PRAPARE - Administrator, Civil Service (Medical): No    Lack of Transportation (Non-Medical): No  Physical Activity: Unknown (05/13/2021)   Exercise Vital Sign    Days of Exercise per Week: 2 days    Minutes of Exercise per Session: Not on file  Stress: No Stress Concern Present (05/13/2021)   [employer] of Occupational Health - Occupational Stress Questionnaire    Feeling of Stress : Not at all  Social Connections: Moderately Isolated (07/07/2022)   Social Connection and Isolation Panel [NHANES]    Frequency of Communication with Friends and Family: More than three times a week    Frequency of Social Gatherings with Friends and Family: Twice a week    Attends Religious Services: More than 4 times per year    Active Member of [employer] or Organizations: No    Attends Banker Meetings: Never    Marital Status: Widowed  Intimate Partner Violence: Not At Risk (06/05/2023)   Humiliation, Afraid, Rape, and Kick questionnaire    Fear of Current or Ex-Partner: No    Emotionally Abused: No    Physically Abused: No    Sexually Abused: No    FAMILY HISTORY: Family History  Problem Relation Age of Onset   Heart disease Father        possibly   Cancer Sister        breast   AAA (abdominal aortic aneurysm) Brother    Cancer Sister         lung    ALLERGIES:  is allergic to other and simvastatin.  MEDICATIONS:  Current Outpatient Medications  Medication Sig Dispense Refill   clobetasol ointment (TEMOVATE) 0.05 % Apply the medication twice daily to stubborn areas of the skin until smooth. Then stop and re-start as the skin changes come back.     cyanocobalamin (VITAMIN B12) 250 MCG tablet Take by mouth.     furosemide (LASIX) 40 MG tablet Take 1 tablet (40 mg total) by mouth daily. 90 tablet 0   lisinopril (ZESTRIL) 5 MG tablet Take 1 tablet (5 mg total) by mouth daily. 90 tablet 0   Multiple Vitamins-Minerals (PRESERVISION AREDS PO) Take 1 tablet by mouth 2 (two) times daily.     predniSONE (DELTASONE) 10 MG tablet Take by mouth.     rosuvastatin (CRESTOR) 10 MG tablet Take 1 tablet (10 mg total) by mouth daily. 90 tablet 0   White Petrolatum (WHITE PETROLEUM JELLY) GEL Apply topically.     gabapentin (NEURONTIN) 100 MG capsule 100 capsule AS NEEDED (route: oral) (Patient not taking: Reported on 06/05/2023) 90 capsule 1   hydrOXYzine (VISTARIL) 25 MG capsule TAKE 1 CAPSULE BY MOUTH EVERY 8 HOURS ASNEEDED FOR ITCHING (Patient not taking: Reported on 06/05/2023) 90 capsule 0   potassium chloride SA (KLOR-CON M) 20 MEQ tablet Take by mouth. (Patient not taking: Reported on 06/05/2023)     Probiotic Product (PROBIOTIC BLEND) CAPS As directed (Patient not taking: Reported on 06/05/2023) 30 capsule 12   psyllium (METAMUCIL SMOOTH TEXTURE) 58.6 % powder Take 1 packet by mouth daily. (Patient not taking: Reported on 06/05/2023) 283 g 12   triamcinolone ointment (KENALOG) 0.5 % Apply 1 Application topically 2 (two) times daily. (Patient not taking: Reported on 06/05/2023) 30 g 0   No current facility-administered medications for  this visit.    Review of Systems  Constitutional:  Positive for fatigue and unexpected weight change. Negative for appetite change, chills and fever.  HENT:   Positive for hearing loss. Negative for voice  change.   Eyes:  Negative for eye problems and icterus.  Respiratory:  Negative for chest tightness, cough and shortness of breath.   Cardiovascular:  Negative for chest pain and leg swelling.  Gastrointestinal:  Positive for abdominal distention. Negative for abdominal pain, blood in stool, diarrhea, nausea, rectal pain and vomiting.       Dysphagia  Endocrine: Negative for hot flashes.  Genitourinary:  Negative for difficulty urinating, dysuria and frequency.   Musculoskeletal:  Positive for arthralgias.  Skin:  Negative for itching.       bullous pemphigoid  Neurological:  Negative for light-headedness and numbness.  Hematological:  Negative for adenopathy. Does not bruise/bleed easily.  Psychiatric/Behavioral:  Negative for confusion.      PHYSICAL EXAMINATION: ECOG PERFORMANCE STATUS: 2 - Symptomatic, <50% confined to bed  Vitals:   06/05/23 1122  BP: 133/67  Pulse: 63  Resp: 18  SpO2: 100%   Filed Weights   06/05/23 1115  Weight: 144 lb (65.3 kg)    Physical Exam   LABORATORY DATA:  I have reviewed the data as listed    Latest Ref Rng & Units 06/05/2023   12:21 PM 06/01/2023    3:51 PM 02/02/2023    4:37 PM  CBC  WBC 4.0 - 10.5 K/uL 11.9  10.1  7.8   Hemoglobin 13.0 - 17.0 g/dL 56.2  13.0  86.5   Hematocrit 39.0 - 52.0 % 39.4  35.4  33.5   Platelets 150 - 400 K/uL 255  220  260       Latest Ref Rng & Units 06/05/2023   12:21 PM 06/01/2023    3:51 PM 03/02/2023    1:39 PM  CMP  Glucose 70 - 99 mg/dL  784  94   BUN 10 - 36 mg/dL  31  8   Creatinine 6.96 - 1.27 mg/dL  2.95  2.84   Sodium 132 - 144 mmol/L  142  140   Potassium 3.5 - 5.2 mmol/L  5.1  3.0   Chloride 96 - 106 mmol/L  101  99   CO2 20 - 29 mmol/L  21  25   Calcium 8.6 - 10.2 mg/dL  9.2  8.7   Total Protein 6.5 - 8.1 g/dL 6.8     Total Bilirubin <1.2 mg/dL 1.1     Alkaline Phos 38 - 126 U/L 58     AST 15 - 41 U/L 20     ALT 0 - 44 U/L 22        RADIOGRAPHIC STUDIES: I have personally  reviewed the radiological images as listed and agreed with the findings in the report. No results found.

## 2023-06-05 NOTE — Assessment & Plan Note (Addendum)
CT findings were reviewed and discussed with patient and niece. New mesenteric mass within 6 weeks, infectious versus aggressive malignancy. Patient's and niece are interested in pursuing imaging workup.  Patient may not be interested in proceeding with aggressive diagnostic procedures and treatments. Recommend to obtain PET scan for further evaluation. Check CBC, chemistries, chromogranin A, CEA, CA 19-9, flow cytometry, LDH

## 2023-06-05 NOTE — Assessment & Plan Note (Signed)
Patient declines gastroenterology workup.

## 2023-06-05 NOTE — Telephone Encounter (Unsigned)
Copied from CRM [id number]. Topic: General - Other >> Jun 05, 2023 12:48 PM Marlow Baars wrote: Reason for CRM: Inetta Fermo home health nurse with [facility] called in wanting to get some information from his office visit on Monday 12/09 and to talk with someone about his legs. Please assist further

## 2023-06-05 NOTE — Telephone Encounter (Signed)
Left message for Nicholas Gross from [facility] to gather more information in regards to previous telephone encounter. Nicholas Gross to give our office a call back to discuss.  OK for [facility] to gather more if patient if she calls back regarding patient.

## 2023-06-08 ENCOUNTER — Inpatient Hospital Stay
Admission: RE | Admit: 2023-06-08 | Discharge: 2023-06-08 | Disposition: A | Discharge status: Home / Self Care | Payer: Self-pay | Source: Ambulatory Visit | Attending: Oncology | Admitting: Oncology

## 2023-06-08 ENCOUNTER — Telehealth: Payer: Self-pay

## 2023-06-08 DIAGNOSIS — K6389 Other specified diseases of intestine: Secondary | ICD-10-CM

## 2023-06-08 LAB — CANCER ANTIGEN 19-9: CA 19-9: 14 U/mL (ref 0–35)

## 2023-06-08 LAB — CEA: CEA: 2.8 ng/mL (ref 0.0–4.7)

## 2023-06-08 NOTE — Telephone Encounter (Signed)
Message sent to Rad onc to cancel appt.   Request made for images to be powershared to [facility].

## 2023-06-08 NOTE — Telephone Encounter (Signed)
-----   Message from [name] sent at 06/05/2023  8:32 PM EST ----- Please cancel his radonc appt.  I think the Radonc referral is a mistake.  Please obtain CT images done on 10/24 at [facility], please consult radiology to compare to his CT on 03/02/23

## 2023-06-08 NOTE — Telephone Encounter (Signed)
Radiology contacted for request to compare outside imaging to images from 9/9

## 2023-06-09 LAB — CHROMOGRANIN A: Chromogranin A (ng/mL): 187.9 ng/mL — ABNORMAL HIGH (ref 0.0–101.8)

## 2023-06-11 LAB — COMP PANEL: LEUKEMIA/LYMPHOMA

## 2023-06-12 ENCOUNTER — Ambulatory Visit
Admission: RE | Admit: 2023-06-12 | Discharge: 2023-06-12 | Disposition: A | Discharge status: Home / Self Care | Payer: Medicare Other | Source: Ambulatory Visit | Attending: Oncology

## 2023-06-12 ENCOUNTER — Other Ambulatory Visit: Payer: Self-pay | Admitting: Family Medicine

## 2023-06-12 ENCOUNTER — Ambulatory Visit: Payer: Medicare Other

## 2023-06-12 DIAGNOSIS — R935 Abnormal findings on diagnostic imaging of other abdominal regions, including retroperitoneum: Secondary | ICD-10-CM | POA: Diagnosis not present

## 2023-06-12 DIAGNOSIS — K6389 Other specified diseases of intestine: Secondary | ICD-10-CM | POA: Insufficient documentation

## 2023-06-12 DIAGNOSIS — R634 Abnormal weight loss: Secondary | ICD-10-CM | POA: Diagnosis present

## 2023-06-12 DIAGNOSIS — R197 Diarrhea, unspecified: Secondary | ICD-10-CM | POA: Diagnosis not present

## 2023-06-12 DIAGNOSIS — R978 Other abnormal tumor markers: Secondary | ICD-10-CM | POA: Diagnosis not present

## 2023-06-12 LAB — GLUCOSE, CAPILLARY: Glucose-Capillary: 70 mg/dL (ref 70–99)

## 2023-06-12 MED ORDER — FLUDEOXYGLUCOSE F - 18 (FDG) INJECTION
8.0100 | Freq: Once | INTRAVENOUS | Status: AC | PRN
  Administered 2023-06-12: 8.01 via INTRAVENOUS

## 2023-06-15 NOTE — Telephone Encounter (Signed)
Requested Prescriptions  Pending Prescriptions Disp Refills   furosemide (LASIX) 40 MG tablet [Pharmacy Med Name: FUROSEMIDE 40 MG TAB] 90 tablet 0    Sig: TAKE 1 TABLET BY MOUTH ONCE DAILY     Cardiovascular:  Diuretics - Loop Failed - 06/15/2023 12:06 PM      Failed - Mg Level in normal range and within 180 days    No results found for: "MG"       Passed - K in normal range and within 180 days    Potassium  Date Value Ref Range Status  06/01/2023 5.1 3.5 - 5.2 mmol/L Final         Passed - Ca in normal range and within 180 days    Calcium  Date Value Ref Range Status  06/01/2023 9.2 8.6 - 10.2 mg/dL Final         Passed - Na in normal range and within 180 days    Sodium  Date Value Ref Range Status  06/01/2023 142 134 - 144 mmol/L Final         Passed - Cr in normal range and within 180 days    Creatinine, Ser  Date Value Ref Range Status  06/01/2023 1.27 0.76 - 1.27 mg/dL Final         Passed - Cl in normal range and within 180 days    Chloride  Date Value Ref Range Status  06/01/2023 101 96 - 106 mmol/L Final         Passed - Last BP in normal range    BP Readings from Last 1 Encounters:  06/05/23 133/67         Passed - Valid encounter within last 6 months    Recent Outpatient Visits           2 weeks ago Bullous disorder   [place] [facility] [place], Megan P, DO   2 months ago Jaundice   [place] [facility] [place], Megan P, DO   3 months ago Diarrhea, unspecified type   [place] [facility] [place], Megan P, DO   3 months ago Diarrhea, unspecified type   [place] [facility] [place], Megan P, DO   3 months ago Diarrhea, unspecified type   [place] [facility] Dorcas Carrow, DO       Future Appointments             In 3 weeks Laural Benes, Oralia Rud, DO [place] [facility], [facility]

## 2023-06-16 ENCOUNTER — Other Ambulatory Visit: Payer: Self-pay | Admitting: Family Medicine

## 2023-06-18 NOTE — Telephone Encounter (Signed)
Requested Prescriptions  Pending Prescriptions Disp Refills   lisinopril (ZESTRIL) 5 MG tablet [Pharmacy Med Name: LISINOPRIL 5 MG TAB] 90 tablet 0    Sig: TAKE 1 TABLET BY MOUTH ONCE DAILY     Cardiovascular:  ACE Inhibitors Passed - 06/18/2023 12:36 PM      Passed - Cr in normal range and within 180 days    Creatinine, Ser  Date Value Ref Range Status  06/01/2023 1.27 0.76 - 1.27 mg/dL Final         Passed - K in normal range and within 180 days    Potassium  Date Value Ref Range Status  06/01/2023 5.1 3.5 - 5.2 mmol/L Final         Passed - Patient is not pregnant      Passed - Last BP in normal range    BP Readings from Last 1 Encounters:  06/05/23 133/67         Passed - Valid encounter within last 6 months    Recent Outpatient Visits           2 weeks ago Bullous disorder   [place] [place], Megan P, DO   2 months ago Jaundice   [facility] [place], Megan P, DO   3 months ago Diarrhea, unspecified type   [place] [facility] [place], Megan P, DO   3 months ago Diarrhea, unspecified type   [place] [facility] [place], Megan P, DO   4 months ago Diarrhea, unspecified type   [place] [facility] Dorcas Carrow, DO       Future Appointments             In 2 weeks Laural Benes, Oralia Rud, DO  [facility], [facility]

## 2023-06-25 ENCOUNTER — Telehealth: Payer: Self-pay | Admitting: Oncology

## 2023-06-25 ENCOUNTER — Inpatient Hospital Stay: Payer: Medicare Other | Admitting: Oncology

## 2023-06-25 ENCOUNTER — Telehealth: Payer: Self-pay | Admitting: Family Medicine

## 2023-06-25 NOTE — Telephone Encounter (Signed)
 Pt missed appt today 06/25/23. I called pt number and niece answered the phone. I let her know about the missed appt. She stated she was not aware of this appt but they have an appt Monday with Dr.Chrystal. I let her know that Dr.Chrystal's appt was canceled per MD. Niece stated she was never told of any of this. We r/s the MD appt with Dr.Yu to Fri.Jan 10th.

## 2023-06-25 NOTE — Telephone Encounter (Signed)
 Mickie Bail from [facility] called stated she saw patient and he said he had a fall on Sunday. He refused ER or any other care. She noticed he had pain when sitting and standing up on his left side. He also had a laceration on his right elbow.

## 2023-06-25 NOTE — Assessment & Plan Note (Deleted)
 CT findings were reviewed and discussed with patient and niece. New mesenteric mass within 6 weeks, infectious versus aggressive malignancy. Patient's and niece are interested in pursuing imaging workup.  Patient may not be interested in proceeding with aggressive diagnostic procedures and treatments. Recommend to obtain PET scan for further evaluation. Check CBC, chemistries, chromogranin A, CEA, CA 19-9, flow cytometry, LDH

## 2023-06-26 NOTE — Telephone Encounter (Signed)
 Noted. If they feel like he needs to be seen sooner we can see him next week.

## 2023-06-26 NOTE — Telephone Encounter (Signed)
 Next appointment is scheduled for 07/06/23.

## 2023-06-29 ENCOUNTER — Institutional Professional Consult (permissible substitution): Payer: Medicare Other | Admitting: Radiation Oncology

## 2023-07-03 ENCOUNTER — Encounter: Payer: Self-pay | Admitting: Oncology

## 2023-07-03 ENCOUNTER — Inpatient Hospital Stay: Payer: Medicare Other | Attending: Oncology | Admitting: Oncology

## 2023-07-03 VITALS — BP 133/69 | HR 81 | Temp 96.6°F | Resp 18 | Wt 152.1 lb

## 2023-07-03 DIAGNOSIS — Z87891 Personal history of nicotine dependence: Secondary | ICD-10-CM | POA: Diagnosis not present

## 2023-07-03 DIAGNOSIS — Z79899 Other long term (current) drug therapy: Secondary | ICD-10-CM | POA: Insufficient documentation

## 2023-07-03 DIAGNOSIS — K6389 Other specified diseases of intestine: Secondary | ICD-10-CM | POA: Diagnosis not present

## 2023-07-03 DIAGNOSIS — Z801 Family history of malignant neoplasm of trachea, bronchus and lung: Secondary | ICD-10-CM | POA: Insufficient documentation

## 2023-07-03 DIAGNOSIS — R1310 Dysphagia, unspecified: Secondary | ICD-10-CM | POA: Diagnosis not present

## 2023-07-03 NOTE — Progress Notes (Signed)
 Hematology/Oncology Consult Note [phone] [phone]     REFERRING PROVIDER: Vicci [name], DO    CHIEF COMPLAINTS/PURPOSE OF CONSULTATION:  Mesenteric mass  ASSESSMENT & PLAN:   Mesenteric mass PET findings were reviewed and discussed with patient and niece. No signifciant FDG activity in mesenteric mass.  He has increased chromogranin A level.  Currently he is asymptomatic and is not interested in aggressive work up/intervention.  I recommend to obtain DOTATAE PET scan in 3 months.  He and niece agrees.    Dysphagia Patient declines gastroenterology workup. No suspicious thoracic findings on PET scan.    Orders Placed This Encounter  Procedures   NM PET DOTATATE SKULL BASE TO MID THIGH    Standing Status:   Future    Expected Date:   10/01/2023    Expiration Date:   07/02/2024    If indicated for the ordered procedure, I authorize the administration of a radiopharmaceutical per Radiology protocol:   Yes    Preferred imaging location?:   [facility]   Follow-up 3 months . All questions were answered. The patient knows to call the clinic with any problems, questions or concerns.  Zelphia Cap, MD, PhD [facility] Hematology Oncology 07/03/2023    HISTORY OF PRESENTING ILLNESS:  Nicholas Gross 89 y.o. male presents to establish care for Mesenteric mass  Patient is a poor historian. He reports hardening in the stomach area. He reported episodes of dysphagia, particularly with dry food, which has been noticed over the past three to four months. The patient also experienced a period of diarrhea lasting two to three weeks, which has since resolved.   03/02/2023 CT abdomen pelvis with contrast showed no findings to explain diarrhea.  No colitis or diverticulitis.  Moderately enlarged prostate gland without specific feature.  Small gallstone in the gallbladder.  Old.  Minor compression deformities of L1 and L3.  04/16/2023, patient was admitted  to [facility] for bullous pemphigoid.  Abdominal distention was noticed then patient had another CT abdomen pelvis with contrast. -Calcified soft tissue mesenteric mass measuring up to 4.1 cm with diffuse surrounding mesenteric stranding. Differential for this mass includes neuroendocrine (favored given adenopathy) versus desmoid tumor.  -Retroperitoneal adenopathy, as above. Findings are nonspecific but suggestive of malignancy.   The patient has also experienced weight loss of approximately 10 pounds since his hospital admission. Despite this, he reported a good appetite. The patient lives alone and is largely independent, although he requires assistance with bathing and has some difficulty with certain tasks such as using the microwave. He ambulates with a walker and is able to manage toileting and dressing independently.  The patient has a history of smoking, with a pack a day habit, but quit approximately 45 years ago. He is currently on a tapering course of prednisone  for his skin bullous pemphigoid , which flares up when steroid stopped. He also uses clobetasol  for his skin.   INTERVAL HISTORY Nicholas Gross is a 89 y.o. male who has above history reviewed by me today presents for follow up visit for mesenteric mass.   PET scan showed  1. Partially calcified LEFT central mesenteric mass with no metabolic activity. In patient with elevated chromogranin consider well differentiated neuroendocrine tumor metastasis which are often on negative FDG PET imaging. Consider DOTATATE PET imaging to evaluate for well differentiated neuroendocrine tumor. 2. No evidence of metastatic disease by FDG PET imaging.  He denies wheezing, skin flushing, diarrhea. No abdominal pain.  MEDICAL HISTORY:  Past Medical History:  Diagnosis Date   CAD (coronary artery disease)    History of GI bleed 2008   History of tobacco use    17 pack years, quit around 1970   Hyperlipidemia    Hypertension     Overweight     SURGICAL HISTORY: Past Surgical History:  Procedure Laterality Date   CAROTID ENDARTERECTOMY Left 2010   CATARACT EXTRACTION     cypher stent  09/12/02   s/p cypher stent mid-LAD   TENDON REPAIR  1991   finger and arm    SOCIAL HISTORY: Social History   Socioeconomic History   Marital status: Widowed    Spouse name: Not on file   Number of children: Not on file   Years of education: some college in [employer]    Highest education level: High school graduate  Occupational History   Occupation: retired   Tobacco Use   Smoking status: Former    Current packs/day: 0.00    Average packs/day: 1 pack/day for 17.0 years (17.0 ttl pk-yrs)    Types: Cigarettes    Start date: 06/24/1951    Quit date: 06/23/1968    Years since quitting: 55.0   Smokeless tobacco: Never  Vaping Use   Vaping status: Never Used  Substance and Sexual Activity   Alcohol  use: No   Drug use: No   Sexual activity: Not Currently  Other Topics Concern   Not on file  Social History Narrative   Attends church, neighbor take him    Social Drivers of Health   Financial Resource Strain: Low Risk  (04/20/2023)   Received from [facility]   Overall Financial Resource Strain (CARDIA)    Difficulty of Paying Living Expenses: Not very hard  Food Insecurity: No Food Insecurity (06/05/2023)   Hunger Vital Sign    Worried About Running Out of Food in the Last Year: Never true    Ran Out of Food in the Last Year: Never true  Transportation Needs: No Transportation Needs (06/05/2023)   PRAPARE - Administrator, Civil Service (Medical): No    Lack of Transportation (Non-Medical): No  Physical Activity: Unknown (05/13/2021)   Exercise Vital Sign    Days of Exercise per Week: 2 days    Minutes of Exercise per Session: Not on file  Stress: No Stress Concern Present (05/13/2021)   [employer] of Occupational Health - Occupational Stress Questionnaire    Feeling of Stress : Not at all   Social Connections: Moderately Isolated (07/07/2022)   Social Connection and Isolation Panel [NHANES]    Frequency of Communication with Friends and Family: More than three times a week    Frequency of Social Gatherings with Friends and Family: Twice a week    Attends Religious Services: More than 4 times per year    Active Member of [employer] or Organizations: No    Attends Banker Meetings: Never    Marital Status: Widowed  Intimate Partner Violence: Not At Risk (06/05/2023)   Humiliation, Afraid, Rape, and Kick questionnaire    Fear of Current or Ex-Partner: No    Emotionally Abused: No    Physically Abused: No    Sexually Abused: No    FAMILY HISTORY: Family History  Problem Relation Age of Onset   Heart disease Father        possibly   Cancer Sister        breast   AAA (abdominal aortic aneurysm) Brother  Cancer Sister        lung    ALLERGIES:  is allergic to other and simvastatin .  MEDICATIONS:  Current Outpatient Medications  Medication Sig Dispense Refill   clobetasol  ointment (TEMOVATE ) 0.05 % Apply the medication twice daily to stubborn areas of the skin until smooth. Then stop and re-start as the skin changes come back.     cyanocobalamin  (VITAMIN B12) 250 MCG tablet Take by mouth.     furosemide  (LASIX ) 40 MG tablet TAKE 1 TABLET BY MOUTH ONCE DAILY 90 tablet 0   lisinopril  (ZESTRIL ) 5 MG tablet TAKE 1 TABLET BY MOUTH ONCE DAILY 90 tablet 0   Multiple Vitamins-Minerals (PRESERVISION AREDS PO) Take 1 tablet by mouth 2 (two) times daily.     predniSONE  (DELTASONE ) 10 MG tablet Take by mouth.     rosuvastatin  (CRESTOR ) 10 MG tablet Take 1 tablet (10 mg total) by mouth daily. 90 tablet 0   gabapentin  (NEURONTIN ) 100 MG capsule 100 capsule AS NEEDED (route: oral) (Patient not taking: Reported on 07/03/2023) 90 capsule 1   hydrOXYzine  (VISTARIL ) 25 MG capsule TAKE 1 CAPSULE BY MOUTH EVERY 8 HOURS ASNEEDED FOR ITCHING (Patient not taking: Reported on  07/03/2023) 90 capsule 0   potassium chloride  SA (KLOR-CON  M) 20 MEQ tablet Take by mouth. (Patient not taking: Reported on 07/03/2023)     Probiotic Product (PROBIOTIC BLEND) CAPS As directed (Patient not taking: Reported on 07/03/2023) 30 capsule 12   psyllium (METAMUCIL SMOOTH TEXTURE) 58.6 % powder Take 1 packet by mouth daily. (Patient not taking: Reported on 07/03/2023) 283 g 12   triamcinolone  ointment (KENALOG ) 0.5 % Apply 1 Application topically 2 (two) times daily. (Patient not taking: Reported on 06/05/2023) 30 g 0   White Petrolatum (WHITE PETROLEUM JELLY) GEL Apply topically.     No current facility-administered medications for this visit.    Review of Systems  Constitutional:  Positive for fatigue and unexpected weight change. Negative for appetite change, chills and fever.  HENT:   Positive for hearing loss. Negative for voice change.   Eyes:  Negative for eye problems and icterus.  Respiratory:  Negative for chest tightness, cough and shortness of breath.   Cardiovascular:  Negative for chest pain and leg swelling.  Gastrointestinal:  Negative for abdominal distention, abdominal pain and blood in stool.       Dysphagia  Endocrine: Negative for hot flashes.  Genitourinary:  Negative for difficulty urinating, dysuria and frequency.   Musculoskeletal:  Positive for arthralgias.  Skin:  Negative for itching.       bullous pemphigoid  Neurological:  Negative for light-headedness and numbness.  Hematological:  Negative for adenopathy. Does not bruise/bleed easily.  Psychiatric/Behavioral:  Negative for confusion.      PHYSICAL EXAMINATION: ECOG PERFORMANCE STATUS: 2 - Symptomatic, <50% confined to bed  Vitals:   07/03/23 1203  BP: 133/69  Pulse: 81  Resp: 18  Temp: (!) 96.6 F (35.9 C)  SpO2: 100%   Filed Weights   07/03/23 1203  Weight: 152 lb 1.6 oz (69 kg)    Physical Exam   LABORATORY DATA:  I have reviewed the data as listed    Latest Ref Rng & Units  06/05/2023   12:21 PM 06/01/2023    3:51 PM 02/02/2023    4:37 PM  CBC  WBC 4.0 - 10.5 K/uL 11.9  10.1  7.8   Hemoglobin 13.0 - 17.0 g/dL 86.8  88.4  88.6   Hematocrit 39.0 - 52.0 %  39.4  35.4  33.5   Platelets 150 - 400 K/uL 255  220  260       Latest Ref Rng & Units 06/05/2023   12:21 PM 06/01/2023    3:51 PM 03/02/2023    1:39 PM  CMP  Glucose 70 - 99 mg/dL  893  94   BUN 10 - 36 mg/dL  31  8   Creatinine 9.23 - 1.27 mg/dL  8.72  8.96   Sodium 865 - 144 mmol/L  142  140   Potassium 3.5 - 5.2 mmol/L  5.1  3.0   Chloride 96 - 106 mmol/L  101  99   CO2 20 - 29 mmol/L  21  25   Calcium  8.6 - 10.2 mg/dL  9.2  8.7   Total Protein 6.5 - 8.1 g/dL 6.8     Total Bilirubin <1.2 mg/dL 1.1     Alkaline Phos 38 - 126 U/L 58     AST 15 - 41 U/L 20     ALT 0 - 44 U/L 22        RADIOGRAPHIC STUDIES: I have personally reviewed the radiological images as listed and agreed with the findings in the report. NM PET Image Initial (PI) Skull Base To Thigh Result Date: 06/25/2023 CLINICAL DATA:  Subsequent treatment strategy for mesenteric mass. History of diarrhea. Elevated chromogranin. EXAM: NUCLEAR MEDICINE PET SKULL BASE TO THIGH TECHNIQUE: 8.0 mCi F-18 FDG was injected intravenously. Full-ring PET imaging was performed from the skull base to thigh after the radiotracer. CT data was obtained and used for attenuation correction and anatomic localization. Fasting blood glucose: 70 mg/dl COMPARISON:  CT 90/90/7975 FINDINGS: NECK: No hypermetabolic lymph nodes in the neck. Incidental CT findings: None. CHEST: No hypermetabolic mediastinal or hilar nodes. No suspicious pulmonary nodules on the CT scan. Incidental CT findings: None. ABDOMEN/PELVIS: Within the LEFT central mesentery there is a partially calcified mass measuring approximately 3.2 x 2.9 cm on image 92/series 6. This is not changed from comparison CT 9/ 02/2023. Second soft tissue component of the mass more medial measures 18 mm on image 84/6. This  mass has no FDG activity. No small bowel mass identified on CT portion exam. No evidence of liver metastasis by FDG PET / CT imaging. Incidental CT findings: None. SKELETON: No focal hypermetabolic activity to suggest skeletal metastasis. Incidental CT findings: None. IMPRESSION: 1. Partially calcified LEFT central mesenteric mass with no metabolic activity. In patient with elevated chromogranin consider well differentiated neuroendocrine tumor metastasis which are often on negative FDG PET imaging. Consider DOTATATE PET imaging to evaluate for well differentiated neuroendocrine tumor. 2. No evidence of metastatic disease by FDG PET imaging. Electronically Signed   By: Jackquline Boxer M.D.   On: 06/25/2023 08:54

## 2023-07-03 NOTE — Assessment & Plan Note (Signed)
 PET findings were reviewed and discussed with patient and niece. No signifciant FDG activity in mesenteric mass.  He has increased chromogranin A level.  Currently he is asymptomatic and is not interested in aggressive work up/intervention.  I recommend to obtain DOTATAE PET scan in 3 months.  He and niece agrees.

## 2023-07-03 NOTE — Assessment & Plan Note (Signed)
 Patient declines gastroenterology workup. No suspicious thoracic findings on PET scan.

## 2023-07-06 ENCOUNTER — Encounter: Payer: Self-pay | Admitting: Family Medicine

## 2023-07-06 ENCOUNTER — Ambulatory Visit (INDEPENDENT_AMBULATORY_CARE_PROVIDER_SITE_OTHER): Payer: Medicare Other | Admitting: Family Medicine

## 2023-07-06 VITALS — BP 138/64 | HR 70 | Wt 151.0 lb

## 2023-07-06 DIAGNOSIS — I10 Essential (primary) hypertension: Secondary | ICD-10-CM

## 2023-07-06 DIAGNOSIS — L139 Bullous disorder, unspecified: Secondary | ICD-10-CM | POA: Diagnosis not present

## 2023-07-06 DIAGNOSIS — K6389 Other specified diseases of intestine: Secondary | ICD-10-CM

## 2023-07-06 MED ORDER — FUROSEMIDE 40 MG PO TABS: 40.0000 mg | ORAL_TABLET | Freq: Every day | ORAL | 0 refills | Status: DC

## 2023-07-06 MED ORDER — ROSUVASTATIN CALCIUM 10 MG PO TABS: 10.0000 mg | ORAL_TABLET | Freq: Every day | ORAL | 0 refills | Status: DC

## 2023-07-06 MED ORDER — LISINOPRIL 5 MG PO TABS: 5.0000 mg | ORAL_TABLET | Freq: Every day | ORAL | 0 refills | Status: DC

## 2023-07-06 NOTE — Assessment & Plan Note (Signed)
Under good control on current regimen. Continue current regimen. Continue to monitor. Call with any concerns. Refills up to date.   

## 2023-07-06 NOTE — Progress Notes (Signed)
 BP 138/64   Pulse 70   Wt 151 lb (68.5 kg)   SpO2 99%   BMI 22.30 kg/m    Subjective:    Patient ID: Nicholas Gross, male    DOB: February 02, 1928, 89 y.o.   MRN: [id number]  HPI: Nicholas Gross is a 89 y.o. male  Chief Complaint  Patient presents with   Hypertension   Rash   Feeling much better. Rash is basically gone. Still a bit itchy.   HYPERTENSION  Hypertension status: controlled  Satisfied with current treatment? no Duration of hypertension: chronic BP monitoring frequency:  not checking BP medication side effects:  no Medication compliance: excellent compliance Previous BP meds: lisinopril , lasix  Aspirin : no Recurrent headaches: no Visual changes: no Palpitations: no Dyspnea: no Chest pain: no Lower extremity edema: yes Dizzy/lightheaded: no  Relevant past medical, surgical, family and social history reviewed and updated as indicated. Interim medical history since our last visit reviewed. Allergies and medications reviewed and updated.  Review of Systems  Constitutional: Negative.   Respiratory: Negative.    Cardiovascular:  Positive for leg swelling. Negative for chest pain and palpitations.  Gastrointestinal: Negative.   Musculoskeletal: Negative.   Neurological: Negative.   Psychiatric/Behavioral: Negative.      Per HPI unless specifically indicated above     Objective:    BP 138/64   Pulse 70   Wt 151 lb (68.5 kg)   SpO2 99%   BMI 22.30 kg/m   Wt Readings from Last 3 Encounters:  07/06/23 151 lb (68.5 kg)  07/03/23 152 lb 1.6 oz (69 kg)  06/05/23 144 lb (65.3 kg)    Physical Exam Vitals and nursing note reviewed.  Constitutional:      General: He is not in acute distress.    Appearance: Normal appearance. He is not ill-appearing, toxic-appearing or diaphoretic.  HENT:     Head: Normocephalic and atraumatic.     Right Ear: External ear normal.     Left Ear: External ear normal.     Nose: Nose normal.     Mouth/Throat:      Mouth: Mucous membranes are moist.     Pharynx: Oropharynx is clear.  Eyes:     General: No scleral icterus.       Right eye: No discharge.        Left eye: No discharge.     Extraocular Movements: Extraocular movements intact.     Conjunctiva/sclera: Conjunctivae normal.     Pupils: Pupils are equal, round, and reactive to light.  Cardiovascular:     Rate and Rhythm: Normal rate and regular rhythm.     Pulses: Normal pulses.     Heart sounds: Murmur heard.     No friction rub. No gallop.  Pulmonary:     Effort: Pulmonary effort is normal. No respiratory distress.     Breath sounds: Normal breath sounds. No stridor. No wheezing, rhonchi or rales.  Chest:     Chest wall: No tenderness.  Musculoskeletal:        General: Normal range of motion.     Cervical back: Normal range of motion and neck supple.     Right lower leg: Edema (trace) present.     Left lower leg: Edema (trace) present.  Skin:    General: Skin is warm and dry.     Capillary Refill: Capillary refill takes less than 2 seconds.     Coloration: Skin is not jaundiced or pale.     Findings:  No bruising, erythema, lesion or rash.  Neurological:     General: No focal deficit present.     Mental Status: He is alert and oriented to person, place, and time. Mental status is at baseline.  Psychiatric:        Mood and Affect: Mood normal.        Behavior: Behavior normal.        Thought Content: Thought content normal.        Judgment: Judgment normal.     Results for orders placed or performed during the hospital encounter of 06/12/23  Glucose, capillary   Collection Time: 06/12/23 11:57 AM  Result Value Ref Range   Glucose-Capillary 70 70 - 99 mg/dL      Assessment & Plan:   Problem List Items Addressed This Visit       Cardiovascular and Mediastinum   Hypertension   Under good control on current regimen. Continue current regimen. Continue to monitor. Call with any concerns. Refills up to date.         Relevant Medications   furosemide  (LASIX ) 40 MG tablet   lisinopril  (ZESTRIL ) 5 MG tablet   rosuvastatin  (CRESTOR ) 10 MG tablet     Other   Mesenteric mass (Chronic)   To have PET scan. Following with oncology. Feeling well.       Bullous disorder - Primary   Significantly improved. Continue to follow with dermatology. Call with any concerns.         Follow up plan: Return in about 3 months (around 10/04/2023).

## 2023-07-06 NOTE — Assessment & Plan Note (Signed)
 Significantly improved. Continue to follow with dermatology. Call with any concerns.

## 2023-07-06 NOTE — Assessment & Plan Note (Signed)
 To have PET scan. Following with oncology. Feeling well.

## 2023-09-09 ENCOUNTER — Encounter: Payer: Self-pay | Admitting: Podiatry

## 2023-09-09 ENCOUNTER — Ambulatory Visit: Admitting: Podiatry

## 2023-09-09 DIAGNOSIS — B351 Tinea unguium: Secondary | ICD-10-CM

## 2023-09-09 DIAGNOSIS — M79676 Pain in unspecified toe(s): Secondary | ICD-10-CM | POA: Diagnosis not present

## 2023-09-09 DIAGNOSIS — D2372 Other benign neoplasm of skin of left lower limb, including hip: Secondary | ICD-10-CM | POA: Diagnosis not present

## 2023-09-09 NOTE — Progress Notes (Signed)
 Subjective:  Patient ID: Nicholas Gross, male    DOB: 1927-08-05,  MRN: [id number] HPI Chief Complaint  Patient presents with   Callouses    Plantar heel left - large thick callus x years, tender to walk   New Patient (Initial Visit)    89 y.o. male presents with the above complaint.   ROS: Denies fever chills nausea vomit muscle aches pains calf pain back pain chest pain shortness of breath  Past Medical History:  Diagnosis Date   CAD (coronary artery disease)    History of GI bleed 2008   History of tobacco use    17 pack years, quit around 1970   Hyperlipidemia    Hypertension    Overweight    Past Surgical History:  Procedure Laterality Date   CAROTID ENDARTERECTOMY Left 2010   CATARACT EXTRACTION     cypher stent  09/12/02   s/p cypher stent mid-LAD   TENDON REPAIR  1991   finger and arm    Current Outpatient Medications:    Dupilumab 300 MG/2ML SOAJ, Inject into the skin., Disp: , Rfl:    valACYclovir (VALTREX) 1000 MG tablet, Take 1,000 mg by mouth 3 (three) times daily., Disp: , Rfl:    clobetasol ointment (TEMOVATE) 0.05 %, Apply the medication twice daily to stubborn areas of the skin until smooth. Then stop and re-start as the skin changes come back., Disp: , Rfl:    cyanocobalamin (VITAMIN B12) 250 MCG tablet, Take by mouth., Disp: , Rfl:    furosemide (LASIX) 40 MG tablet, Take 1 tablet (40 mg total) by mouth daily., Disp: 90 tablet, Rfl: 0   lisinopril (ZESTRIL) 5 MG tablet, Take 1 tablet (5 mg total) by mouth daily., Disp: 90 tablet, Rfl: 0   Multiple Vitamins-Minerals (PRESERVISION AREDS PO), Take 1 tablet by mouth 2 (two) times daily., Disp: , Rfl:    rosuvastatin (CRESTOR) 10 MG tablet, Take 1 tablet (10 mg total) by mouth daily., Disp: 90 tablet, Rfl: 0   White Petrolatum (WHITE PETROLEUM JELLY) GEL, Apply topically., Disp: , Rfl:   Allergies  Allergen Reactions   Other     Patient states that he is allergic to something that they place in the IV  before they work on you   Simvastatin Other (See Comments)    Myalgia    Review of Systems Objective:  There were no vitals filed for this visit.  General: Well developed, nourished, in no acute distress, alert and oriented x3   Dermatological: Skin is warm, dry and supple bilateral. Nails are thick yellow dystrophic clinically mycotic painful remaining integument appears unremarkable at this time. There are no open sores, no preulcerative lesions, no rash or signs of infection present.  Severe hyperkeratotic benign skin lesion plantar aspect of the left heel.  Preulcerative.  Vascular: Dorsalis Pedis artery and Posterior Tibial artery pedal pulses are 2/4 bilateral with immedate capillary fill time. Pedal hair growth present. No varicosities and no lower extremity edema present bilateral.   Neruologic: Grossly intact via light touch bilateral. Vibratory intact via tuning fork bilateral. Protective threshold with Semmes Wienstein monofilament intact to all pedal sites bilateral. Patellar and Achilles deep tendon reflexes 2+ bilateral. No Babinski or clonus noted bilateral.   Musculoskeletal: No gross boney pedal deformities bilateral. No pain, crepitus, or limitation noted with foot and ankle range of motion bilateral. Muscular strength 5/5 in all groups tested bilateral.  Gait: Unassisted, Nonantalgic.    Radiographs:  None taken  Assessment &  Plan:   Assessment: Preulcerative benign lesion left heel.  Pain limb secondary to onychomycosis.  Plan: Debrided benign skin lesion left heel.  Debrided this with a chisel blade no bleeding no iatrogenic lesions noted.  Debrided nails 1 through 5 bilateral covered service.     Yazmyn Valbuena T. [place], [place]

## 2023-09-16 ENCOUNTER — Other Ambulatory Visit: Payer: Self-pay | Admitting: Family Medicine

## 2023-09-17 NOTE — Telephone Encounter (Signed)
 Requested medication (s) are due for refill today: yes  Requested medication (s) are on the active medication list: yes  Last refill:  07/06/23 #90  Future visit scheduled: yes  Notes to clinic:  No Mg level and overdue for visit   Requested Prescriptions  Pending Prescriptions Disp Refills   furosemide (LASIX) 40 MG tablet [Pharmacy Med Name: FUROSEMIDE 40 MG TAB] 90 tablet 0    Sig: TAKE 1 TABLET BY MOUTH ONCE DAILY     Cardiovascular:  Diuretics - Loop Failed - 09/17/2023  3:21 PM      Failed - Mg Level in normal range and within 180 days    No results found for: "MG"       Failed - Valid encounter within last 6 months    Recent Outpatient Visits   None     Future Appointments             In 1 week Johnson, Megan P, DO [facility]            Passed - K in normal range and within 180 days    Potassium  Date Value Ref Range Status  06/01/2023 5.1 3.5 - 5.2 mmol/L Final         Passed - Ca in normal range and within 180 days    Calcium  Date Value Ref Range Status  06/01/2023 9.2 8.6 - 10.2 mg/dL Final         Passed - Na in normal range and within 180 days    Sodium  Date Value Ref Range Status  06/01/2023 142 134 - 144 mmol/L Final         Passed - Cr in normal range and within 180 days    Creatinine, Ser  Date Value Ref Range Status  06/01/2023 1.27 0.76 - 1.27 mg/dL Final         Passed - Cl in normal range and within 180 days    Chloride  Date Value Ref Range Status  06/01/2023 101 96 - 106 mmol/L Final         Passed - Last BP in normal range    BP Readings from Last 1 Encounters:  07/06/23 138/64

## 2023-09-22 ENCOUNTER — Ambulatory Visit
Admission: RE | Admit: 2023-09-22 | Discharge: 2023-09-22 | Disposition: A | Discharge status: Home / Self Care | Payer: Medicare Other | Source: Ambulatory Visit | Attending: Oncology | Admitting: Oncology

## 2023-09-22 DIAGNOSIS — N400 Enlarged prostate without lower urinary tract symptoms: Secondary | ICD-10-CM | POA: Diagnosis not present

## 2023-09-22 DIAGNOSIS — K6389 Other specified diseases of intestine: Secondary | ICD-10-CM | POA: Diagnosis present

## 2023-09-22 DIAGNOSIS — R197 Diarrhea, unspecified: Secondary | ICD-10-CM | POA: Diagnosis present

## 2023-09-22 MED ORDER — COPPER CU 64 DOTATATE 1 MCI/ML IV SOLN
4.0000 | Freq: Once | INTRAVENOUS | Status: AC
  Administered 2023-09-22: 4.43 via INTRAVENOUS

## 2023-09-28 ENCOUNTER — Emergency Department

## 2023-09-28 ENCOUNTER — Inpatient Hospital Stay
Admission: EM | Admit: 2023-09-28 | Discharge: 2023-10-05 | DRG: 963 | Disposition: A | Discharge status: Skilled Nursing Facility | Attending: Internal Medicine | Admitting: Internal Medicine

## 2023-09-28 DIAGNOSIS — Z87891 Personal history of nicotine dependence: Secondary | ICD-10-CM

## 2023-09-28 DIAGNOSIS — E876 Hypokalemia: Secondary | ICD-10-CM | POA: Diagnosis not present

## 2023-09-28 DIAGNOSIS — E870 Hyperosmolality and hypernatremia: Secondary | ICD-10-CM | POA: Diagnosis present

## 2023-09-28 DIAGNOSIS — S32592A Other specified fracture of left pubis, initial encounter for closed fracture: Secondary | ICD-10-CM

## 2023-09-28 DIAGNOSIS — Z79899 Other long term (current) drug therapy: Secondary | ICD-10-CM

## 2023-09-28 DIAGNOSIS — Z7982 Long term (current) use of aspirin: Secondary | ICD-10-CM

## 2023-09-28 DIAGNOSIS — Z602 Problems related to living alone: Secondary | ICD-10-CM | POA: Diagnosis present

## 2023-09-28 DIAGNOSIS — T796XXA Traumatic ischemia of muscle, initial encounter: Secondary | ICD-10-CM | POA: Diagnosis not present

## 2023-09-28 DIAGNOSIS — R532 Functional quadriplegia: Secondary | ICD-10-CM | POA: Diagnosis present

## 2023-09-28 DIAGNOSIS — E663 Overweight: Secondary | ICD-10-CM | POA: Diagnosis present

## 2023-09-28 DIAGNOSIS — L8990 Pressure ulcer of unspecified site, unspecified stage: Secondary | ICD-10-CM | POA: Insufficient documentation

## 2023-09-28 DIAGNOSIS — K683 Retroperitoneal hematoma: Secondary | ICD-10-CM

## 2023-09-28 DIAGNOSIS — I2510 Atherosclerotic heart disease of native coronary artery without angina pectoris: Secondary | ICD-10-CM | POA: Diagnosis present

## 2023-09-28 DIAGNOSIS — L120 Bullous pemphigoid: Secondary | ICD-10-CM | POA: Diagnosis present

## 2023-09-28 DIAGNOSIS — D649 Anemia, unspecified: Secondary | ICD-10-CM | POA: Diagnosis present

## 2023-09-28 DIAGNOSIS — W19XXXA Unspecified fall, initial encounter: Secondary | ICD-10-CM | POA: Diagnosis present

## 2023-09-28 DIAGNOSIS — G9340 Encephalopathy, unspecified: Secondary | ICD-10-CM | POA: Diagnosis not present

## 2023-09-28 DIAGNOSIS — Z66 Do not resuscitate: Secondary | ICD-10-CM | POA: Diagnosis not present

## 2023-09-28 DIAGNOSIS — I10 Essential (primary) hypertension: Secondary | ICD-10-CM | POA: Diagnosis present

## 2023-09-28 DIAGNOSIS — Z6822 Body mass index (BMI) 22.0-22.9, adult: Secondary | ICD-10-CM

## 2023-09-28 DIAGNOSIS — R1310 Dysphagia, unspecified: Secondary | ICD-10-CM | POA: Diagnosis present

## 2023-09-28 DIAGNOSIS — E785 Hyperlipidemia, unspecified: Secondary | ICD-10-CM | POA: Diagnosis present

## 2023-09-28 DIAGNOSIS — G9341 Metabolic encephalopathy: Principal | ICD-10-CM | POA: Diagnosis present

## 2023-09-28 DIAGNOSIS — Z515 Encounter for palliative care: Secondary | ICD-10-CM

## 2023-09-28 DIAGNOSIS — M6282 Rhabdomyolysis: Secondary | ICD-10-CM | POA: Diagnosis not present

## 2023-09-28 DIAGNOSIS — Z1152 Encounter for screening for COVID-19: Secondary | ICD-10-CM

## 2023-09-28 DIAGNOSIS — N401 Enlarged prostate with lower urinary tract symptoms: Secondary | ICD-10-CM | POA: Diagnosis present

## 2023-09-28 DIAGNOSIS — R338 Other retention of urine: Secondary | ICD-10-CM | POA: Diagnosis present

## 2023-09-28 DIAGNOSIS — R319 Hematuria, unspecified: Secondary | ICD-10-CM | POA: Diagnosis present

## 2023-09-28 DIAGNOSIS — I69351 Hemiplegia and hemiparesis following cerebral infarction affecting right dominant side: Secondary | ICD-10-CM

## 2023-09-28 DIAGNOSIS — S329XXA Fracture of unspecified parts of lumbosacral spine and pelvis, initial encounter for closed fracture: Secondary | ICD-10-CM

## 2023-09-28 DIAGNOSIS — Y92009 Unspecified place in unspecified non-institutional (private) residence as the place of occurrence of the external cause: Secondary | ICD-10-CM

## 2023-09-28 DIAGNOSIS — L89152 Pressure ulcer of sacral region, stage 2: Secondary | ICD-10-CM | POA: Diagnosis present

## 2023-09-28 DIAGNOSIS — R339 Retention of urine, unspecified: Principal | ICD-10-CM | POA: Diagnosis present

## 2023-09-28 DIAGNOSIS — I493 Ventricular premature depolarization: Secondary | ICD-10-CM | POA: Diagnosis present

## 2023-09-28 DIAGNOSIS — R9431 Abnormal electrocardiogram [ECG] [EKG]: Secondary | ICD-10-CM | POA: Diagnosis present

## 2023-09-28 DIAGNOSIS — S32591A Other specified fracture of right pubis, initial encounter for closed fracture: Secondary | ICD-10-CM | POA: Diagnosis present

## 2023-09-28 DIAGNOSIS — S32599G Other specified fracture of unspecified pubis, subsequent encounter for fracture with delayed healing: Secondary | ICD-10-CM

## 2023-09-28 DIAGNOSIS — Z8249 Family history of ischemic heart disease and other diseases of the circulatory system: Secondary | ICD-10-CM

## 2023-09-28 LAB — URINALYSIS, ROUTINE W REFLEX MICROSCOPIC
Bilirubin Urine: NEGATIVE
Glucose, UA: NEGATIVE mg/dL
Hgb urine dipstick: NEGATIVE
Ketones, ur: NEGATIVE mg/dL
Leukocytes,Ua: NEGATIVE
Nitrite: NEGATIVE
Protein, ur: NEGATIVE mg/dL
Specific Gravity, Urine: 1.015 (ref 1.005–1.030)
pH: 6 (ref 5.0–8.0)

## 2023-09-28 LAB — CBC WITH DIFFERENTIAL/PLATELET
Abs Immature Granulocytes: 0.1 10*3/uL — ABNORMAL HIGH (ref 0.00–0.07)
Basophils Absolute: 0 10*3/uL (ref 0.0–0.1)
Basophils Relative: 0 %
Eosinophils Absolute: 0 10*3/uL (ref 0.0–0.5)
Eosinophils Relative: 0 %
HCT: 35.5 % — ABNORMAL LOW (ref 39.0–52.0)
Hemoglobin: 12.1 g/dL — ABNORMAL LOW (ref 13.0–17.0)
Immature Granulocytes: 1 %
Lymphocytes Relative: 4 %
Lymphs Abs: 0.6 10*3/uL — ABNORMAL LOW (ref 0.7–4.0)
MCH: 33 pg (ref 26.0–34.0)
MCHC: 34.1 g/dL (ref 30.0–36.0)
MCV: 96.7 fL (ref 80.0–100.0)
Monocytes Absolute: 1.2 10*3/uL — ABNORMAL HIGH (ref 0.1–1.0)
Monocytes Relative: 9 %
Neutro Abs: 11.7 10*3/uL — ABNORMAL HIGH (ref 1.7–7.7)
Neutrophils Relative %: 86 %
Platelets: 170 10*3/uL (ref 150–400)
RBC: 3.67 MIL/uL — ABNORMAL LOW (ref 4.22–5.81)
RDW: 13.3 % (ref 11.5–15.5)
WBC: 13.6 10*3/uL — ABNORMAL HIGH (ref 4.0–10.5)
nRBC: 0 % (ref 0.0–0.2)

## 2023-09-28 LAB — COMPREHENSIVE METABOLIC PANEL WITH GFR
ALT: 27 U/L (ref 0–44)
AST: 43 U/L — ABNORMAL HIGH (ref 15–41)
Albumin: 3.8 g/dL (ref 3.5–5.0)
Alkaline Phosphatase: 41 U/L (ref 38–126)
Anion gap: 10 (ref 5–15)
BUN: 25 mg/dL — ABNORMAL HIGH (ref 8–23)
CO2: 29 mmol/L (ref 22–32)
Calcium: 9.3 mg/dL (ref 8.9–10.3)
Chloride: 106 mmol/L (ref 98–111)
Creatinine, Ser: 1.02 mg/dL (ref 0.61–1.24)
GFR, Estimated: >60 mL/min (ref >60)
Glucose, Bld: 127 mg/dL — ABNORMAL HIGH (ref 70–99)
Potassium: 2.6 mmol/L — CL (ref 3.5–5.1)
Sodium: 145 mmol/L (ref 135–145)
Total Bilirubin: 1.5 mg/dL — ABNORMAL HIGH (ref 0.0–1.2)
Total Protein: 5.8 g/dL — ABNORMAL LOW (ref 6.5–8.1)

## 2023-09-28 LAB — RESP PANEL BY RT-PCR (RSV, FLU A&B, COVID)  RVPGX2
Influenza A by PCR: NEGATIVE
Influenza B by PCR: NEGATIVE
Resp Syncytial Virus by PCR: NEGATIVE
SARS Coronavirus 2 by RT PCR: NEGATIVE

## 2023-09-28 LAB — CK: Total CK: 719 U/L — ABNORMAL HIGH (ref 49–397)

## 2023-09-28 LAB — TROPONIN I (HIGH SENSITIVITY)
Troponin I (High Sensitivity): 52 ng/L — ABNORMAL HIGH (ref <18)
Troponin I (High Sensitivity): 21 ng/L — ABNORMAL HIGH (ref <18)

## 2023-09-28 LAB — MAGNESIUM: Magnesium: 2 mg/dL (ref 1.7–2.4)

## 2023-09-28 MED ORDER — IOHEXOL 300 MG/ML  SOLN
80.0000 mL | Freq: Once | INTRAMUSCULAR | Status: AC | PRN
  Administered 2023-09-28: 80 mL via INTRAVENOUS

## 2023-09-28 MED ORDER — LACTATED RINGERS IV BOLUS: 1000.0000 mL | Freq: Once | INTRAVENOUS | Status: DC

## 2023-09-28 MED ORDER — LISINOPRIL 5 MG PO TABS
5.0000 mg | ORAL_TABLET | Freq: Every day | ORAL | Status: DC
  Administered 2023-09-29 – 2023-10-05 (×7): 5 mg via ORAL
  Filled 2023-09-28 (×7): qty 1

## 2023-09-28 MED ORDER — MAGNESIUM HYDROXIDE 400 MG/5ML PO SUSP: 30.0000 mL | Freq: Every day | ORAL | Status: DC | PRN

## 2023-09-28 MED ORDER — CYANOCOBALAMIN 500 MCG PO TABS
250.0000 ug | ORAL_TABLET | Freq: Every day | ORAL | Status: DC
  Administered 2023-09-30 – 2023-10-05 (×6): 250 ug via ORAL
  Filled 2023-09-28 (×7): qty 1

## 2023-09-28 MED ORDER — ONDANSETRON HCL 4 MG PO TABS: 4.0000 mg | ORAL_TABLET | Freq: Four times a day (QID) | ORAL | Status: DC | PRN

## 2023-09-28 MED ORDER — TRAZODONE HCL 50 MG PO TABS
25.0000 mg | ORAL_TABLET | Freq: Every evening | ORAL | Status: DC | PRN
  Administered 2023-09-30 – 2023-10-03 (×3): 25 mg via ORAL
  Filled 2023-09-28 (×5): qty 1

## 2023-09-28 MED ORDER — ROSUVASTATIN CALCIUM 10 MG PO TABS
10.0000 mg | ORAL_TABLET | Freq: Every day | ORAL | Status: DC
  Administered 2023-09-29 – 2023-10-05 (×7): 10 mg via ORAL
  Filled 2023-09-28 (×7): qty 1

## 2023-09-28 MED ORDER — OCUVITE-LUTEIN PO CAPS
1.0000 | ORAL_CAPSULE | Freq: Two times a day (BID) | ORAL | Status: DC
  Filled 2023-09-28: qty 1

## 2023-09-28 MED ORDER — ACETAMINOPHEN 325 MG PO TABS
650.0000 mg | ORAL_TABLET | Freq: Four times a day (QID) | ORAL | Status: DC | PRN
  Administered 2023-10-01 – 2023-10-05 (×7): 650 mg via ORAL
  Filled 2023-09-28 (×9): qty 2

## 2023-09-28 MED ORDER — CLOBETASOL PROPIONATE 0.05 % EX OINT
TOPICAL_OINTMENT | Freq: Two times a day (BID) | CUTANEOUS | Status: DC | PRN
  Filled 2023-09-28 (×2): qty 15

## 2023-09-28 MED ORDER — SODIUM CHLORIDE 0.9 % IV SOLN: INTRAVENOUS | Status: DC

## 2023-09-28 MED ORDER — POTASSIUM CHLORIDE IN NACL 20-0.9 MEQ/L-% IV SOLN
INTRAVENOUS | Status: DC
  Filled 2023-09-28: qty 1000

## 2023-09-28 MED ORDER — ACETAMINOPHEN 650 MG RE SUPP
650.0000 mg | Freq: Four times a day (QID) | RECTAL | Status: DC | PRN
  Administered 2023-09-29: 650 mg via RECTAL
  Filled 2023-09-28: qty 1

## 2023-09-28 MED ORDER — ENOXAPARIN SODIUM 40 MG/0.4ML IJ SOSY
40.0000 mg | PREFILLED_SYRINGE | INTRAMUSCULAR | Status: DC
Start: 2023-09-28 — End: 2023-10-01
  Administered 2023-09-29 – 2023-09-30 (×3): 40 mg via SUBCUTANEOUS
  Filled 2023-09-28 (×3): qty 0.4

## 2023-09-28 MED ORDER — ONDANSETRON HCL 4 MG/2ML IJ SOLN: 4.0000 mg | Freq: Four times a day (QID) | INTRAMUSCULAR | Status: DC | PRN

## 2023-09-28 MED ORDER — POTASSIUM CHLORIDE 10 MEQ/100ML IV SOLN
10.0000 meq | INTRAVENOUS | Status: AC
  Administered 2023-09-28 (×2): 10 meq via INTRAVENOUS
  Filled 2023-09-28 (×2): qty 100

## 2023-09-28 NOTE — ED Triage Notes (Signed)
 Pt presents to the ED via [facility] from home. Pt resides alone and has a caretaker that comes in to help everyday. Pt was found with his head under a table. Pt has hx of HTN and a stroke that effected his right side. Pt is able to walk with a walker at baseline  BP 210/90 CBG 134 HR70 98% RA CO2 35

## 2023-09-28 NOTE — ED Notes (Signed)
 Patient transported to CT

## 2023-09-28 NOTE — ED Notes (Signed)
 Pt back from CT

## 2023-09-28 NOTE — ED Provider Notes (Signed)
 [facility] Provider Note    Event Date/Time   First MD Initiated Contact with Patient 09/28/23 1532     (approximate)   History   Altered Mental Status   HPI  Nicholas Gross is a 89 y.o. male who comes in from home due to being found with his head under a table.  Patient has a history of hypertension and stroke that affected his right side.  Patient still able to take typically walk with a walker.  Patient is able to state his name but otherwise not able to give any medical history or any explanation of symptoms.  He states that he feels like he has to pee but then is unable to urinate with [place].  Discussed with niece who states she says him at 2pm yesterday. Neighbor found at 2pm this afternoon.  According to the niece patient did have a fall last night.  She states that his speech always has had some issues after his prior stroke and she felt like his speech was pretty similar to prior today.   Physical Exam   Triage Vital Signs: ED Triage Vitals  Encounter Vitals Group     BP 09/28/23 1526 (!) 208/82     Systolic BP Percentile --      Diastolic BP Percentile --      Pulse Rate 09/28/23 1526 64     Resp 09/28/23 1526 18     Temp 09/28/23 1526 97.9 F (36.6 C)     Temp Source 09/28/23 1526 Oral     SpO2 09/28/23 1526 100 %     Weight 09/28/23 1530 151 lb 0.2 oz (68.5 kg)     Height 09/28/23 1530 5\' 9"  (1.753 m)     Head Circumference --      Peak Flow --      Pain Score 09/28/23 1526 0     Pain Loc --      Pain Education --      Exclude from Growth Chart --     Most recent vital signs: Vitals:   09/28/23 1526  BP: (!) 208/82  Pulse: 64  Resp: 18  Temp: 97.9 F (36.6 C)  SpO2: 100%     General: Awake, no distress.  CV:  Good peripheral perfusion.  Resp:  Normal effort.  Abd:  No distention.  Patient's bladder does appear distended. Other:  Patient unable to lift both legs up off the bed does wiggle toes bilaterally and squeeze  with bilateral hands   ED Results / Procedures / Treatments   Labs (all labs ordered are listed, but only abnormal results are displayed) Labs Reviewed  CBC WITH DIFFERENTIAL/PLATELET - Abnormal; Notable for the following components:      Result Value   WBC 13.6 (*)    RBC 3.67 (*)    Hemoglobin 12.1 (*)    HCT 35.5 (*)    Neutro Abs 11.7 (*)    Lymphs Abs 0.6 (*)    Monocytes Absolute 1.2 (*)    Abs Immature Granulocytes 0.10 (*)    All other components within normal limits  COMPREHENSIVE METABOLIC PANEL WITH GFR - Abnormal; Notable for the following components:   Potassium 2.6 (*)    Glucose, Bld 127 (*)    BUN 25 (*)    Total Protein 5.8 (*)    AST 43 (*)    Total Bilirubin 1.5 (*)    All other components within normal limits  CK - Abnormal; Notable  for the following components:   Total CK 719 (*)    All other components within normal limits  URINALYSIS, ROUTINE W REFLEX MICROSCOPIC - Abnormal; Notable for the following components:   Color, Urine YELLOW (*)    APPearance CLEAR (*)    All other components within normal limits  TROPONIN I (HIGH SENSITIVITY) - Abnormal; Notable for the following components:   Troponin I (High Sensitivity) 52 (*)    All other components within normal limits  TROPONIN I (HIGH SENSITIVITY) - Abnormal; Notable for the following components:   Troponin I (High Sensitivity) 21 (*)    All other components within normal limits  RESP PANEL BY RT-PCR (RSV, FLU A&B, COVID)  RVPGX2  MAGNESIUM     EKG  My interpretation of EKG:  Sinus with a rate of 62 without any ST elevation or T wave inversions, QTc of 510  RADIOLOGY I have reviewed the ct personally and interpreted no evidence of intracranial hemorrhage   PROCEDURES:  Critical Care performed: No  .1-3 Lead EKG Interpretation  Performed by: Concha Se, MD Authorized by: Concha Se, MD     Interpretation: normal     ECG rate:  70   ECG rate assessment: normal     Rhythm:  sinus rhythm     Ectopy: none     Conduction: normal      MEDICATIONS ORDERED IN ED: Medications  potassium chloride 10 mEq in 100 mL IVPB (0 mEq Intravenous Stopped 09/28/23 1847)  iohexol (OMNIPAQUE) 300 MG/ML solution 80 mL (80 mLs Intravenous Contrast Given 09/28/23 1850)     IMPRESSION / MDM / ASSESSMENT AND PLAN / ED COURSE  I reviewed the triage vital signs and the nursing notes.   Patient's presentation is most consistent with acute presentation with potential threat to life or bodily function.   Patient comes in with altered mental status with concern for fall.  Patient not the best historian will get CT scans to evaluate for injuries.  Patient's bladder does appear distended on examination but he was able to urinate and the nurse stated that the bladder scan was 0.  Unclear this was before or after the CT imaging so I recommend placing a Foley given CT scan concerning for distention.  Stroke code was not called given last known normal greater than 24 hours ago he has nonfocal examination as he is following some commands and moving both sides although very difficult and understanding him in getting any type of history.  COVID, flu are negative.  Urine without evidence of UTI.  CBC does show elevated white count.  CMP shows low potassium will give some IV repletion.  CK level is elevated troponin slightly elevated most likely demand.  CT imaging shows ascending aortic aneurysm with bladder distention  CT head and neck negative  I discussed with the radiologist who was not worried about the aorta-did not feel the CT dissection was needed.  Just wanted a CT abdomen pelvis with contrast to evaluate the bladder.  Troponin is downtrending  IMPRESSION: 1. Foley catheter within the bladder which is partially decompressed. Negative for extraluminal extravasation of contrast from the bladder. 2. Diffuse thick-walled appearance of the bladder either due to cystitis or chronic  obstruction. 3. Acute appearing right superior and inferior pubic rami fractures superimposed on chronic right pubic ramus fractures. Small volume slightly dense fluid within the left greater than right extraperitoneal space likely due to small amount of extraperitoneal hematoma. 4. Markedly enlarged prostate  5. Partially visualized calcified mesenteric mass., see previously performed CT chest abdomen pelvis report 6. Aortic atherosclerosis.  On repeat patient remains altered no family at bedside to update.  I will discuss the hospital team for admission for concern for pelvic fractures, altered mental status, bladder retention.  I did call patient's niece and updated her.  Patient is not following commands as well as he was earlier.  Unclear why his mental status is changing.  I did discuss with the hospitalist the patient may benefit from MRI given prior history of stroke.  However stroke code was not called initially due to being out of the window with LLK more then 24 hours.   The patient is on the cardiac monitor to evaluate for evidence of arrhythmia and/or significant heart rate changes.      FINAL CLINICAL IMPRESSION(S) / ED DIAGNOSES   Final diagnoses:  Urinary retention  Hypokalemia  Closed nondisplaced fracture of pelvis, unspecified part of pelvis, initial encounter (HCC)     Rx / DC Orders   ED Discharge Orders     None        Note:  This document was prepared using Dragon voice recognition software and may include unintentional dictation errors.   Concha Se, MD 09/28/23 2253

## 2023-09-28 NOTE — H&P (Incomplete)
    PATIENT NAME: Nicholas Gross    MR#:  [id number]  DATE OF BIRTH:  [birth year]  DATE OF ADMISSION:  09/28/2023  PRIMARY CARE PHYSICIAN: Dorcas Carrow, DO   Patient is coming from: Home  REQUESTING/REFERRING PHYSICIAN: Artis Delay, MD  CHIEF COMPLAINT:   Chief Complaint  Patient presents with   Altered Mental Status    HISTORY OF PRESENT ILLNESS:  Nicholas Gross is a 89 y.o. Caucasian male with medical history significant for coronary artery disease, hypertension CVA with residual right-sided weakness and dyslipidemia, who presented to the emergency room with acute onset of ultimate status with confusion.  The patient lives alone in a trailer and ambulates with a walker.  According to his niece he was last seen normal yesterday at 2 PM.  Today at 2 PM his neighbor found him on the ground under a table before calling EMS.  The patient is able to ambulate with a walker at baseline.  He was unable to give much history apart from stating his name.  ED Course: When the patient came to the ER, BP was 208/82 with otherwise normal vital signs.  Labs revealed hypokalemia of 2.6 and a BUN of 25 and AST 43 with total bili of 5.8 and total bili 1.5.  Total CK was 719 and high sensitive troponin I was 5221.  CBC showed leukocytosis 13.6 with neutrophilia as well as mild anemia respiratory panel came back negative.  UA came back negative.    EKG as reviewed by me : EKG showed sinus rhythm with rate of 62 with PVCs and prolonged QT interval with QTc 510 MS. Imaging: Noncontrast head CT scan showed no acute intracranial normalities.  It showed chronic small vessel ischemic changes of the right matter.   C-spine CT showed degenerative changes with no acute osseous abnormalities.   Pelvic CT revealed the following: 1. Foley catheter within the urinary bladder. Diffuse thick-walled appearance of the urinary bladder with trabeculation suggesting chronic outlet obstruction. No  definite extraluminal extravasation of contrast from the bladder. 2. Small volume slightly dense fluid within the extraperitoneal space, suspect small volume extraperitoneal hematoma. 3. Acute appearing right superior and inferior pubic rami fractures. Chronic right inferior pubic ramus fracture. 4. Markedly enlarged prostate.   The patient was given 10 mg of IV potassium chloride.  He will be admitted to a medical telemetry observation bed for further evaluation and management. PAST MEDICAL HISTORY:   Past Medical History:  Diagnosis Date   CAD (coronary artery disease)    History of GI bleed 2008   History of tobacco use    17 pack years, quit around 1970   Hyperlipidemia    Hypertension    Overweight     PAST SURGICAL HISTORY:   Past Surgical History:  Procedure Laterality Date   CAROTID ENDARTERECTOMY Left 2010   CATARACT EXTRACTION     cypher stent  09/12/02   s/p cypher stent mid-LAD   TENDON REPAIR  1991   finger and arm    SOCIAL HISTORY:   Social History   Tobacco Use   Smoking status: Former    Current packs/day: 0.00    Average packs/day: 1 pack/day for 17.0 years (17.0 ttl pk-yrs)    Types: Cigarettes    Start date: 06/24/1951    Quit date: 06/23/1968    Years since quitting: 55.3   Smokeless tobacco: Never  Substance Use Topics   Alcohol use: No  FAMILY HISTORY:   Family History  Problem Relation Age of Onset   Heart disease Father        possibly   Cancer Sister        breast   AAA (abdominal aortic aneurysm) Brother    Cancer Sister        lung    DRUG ALLERGIES:   Allergies  Allergen Reactions   Other     Patient states that he is allergic to something that they place in the IV before they work on you   Simvastatin Other (See Comments)    Myalgia     REVIEW OF SYSTEMS:   ROS As per history of present illness. All pertinent systems were reviewed above. Constitutional, HEENT, cardiovascular, respiratory, GI, GU, musculoskeletal,  neuro, psychiatric, endocrine, integumentary and hematologic systems were reviewed and are otherwise negative/unremarkable except for positive findings mentioned above in the HPI.   MEDICATIONS AT HOME:   Prior to Admission medications   Medication Sig Start Date End Date Taking? Authorizing Provider  clobetasol ointment (TEMOVATE) 0.05 % Apply the medication twice daily to stubborn areas of the skin until smooth. Then stop and re-start as the skin changes come back. 05/28/23   [provider]  cyanocobalamin (VITAMIN B12) 250 MCG tablet Take by mouth.    [provider]  Dupilumab 300 MG/2ML SOAJ Inject into the skin. 07/23/23   [provider]  furosemide (LASIX) 40 MG tablet Take 1 tablet (40 mg total) by mouth daily. 07/06/23   Johnson, Megan P, DO  lisinopril (ZESTRIL) 5 MG tablet Take 1 tablet (5 mg total) by mouth daily. 07/06/23   Johnson, Megan P, DO  Multiple Vitamins-Minerals (PRESERVISION AREDS PO) Take 1 tablet by mouth 2 (two) times daily.    [provider]  rosuvastatin (CRESTOR) 10 MG tablet Take 1 tablet (10 mg total) by mouth daily. 07/06/23   Johnson, Megan P, DO  valACYclovir (VALTREX) 1000 MG tablet Take 1,000 mg by mouth 3 (three) times daily. 07/23/23   [provider]  White Petrolatum (WHITE PETROLEUM JELLY) GEL Apply topically. 04/24/23 04/23/24  [provider]      VITAL SIGNS:  Blood pressure (!) 190/146, pulse 74, temperature 98.2 F (36.8 C), temperature source Axillary, resp. rate (!) 27, height 5\' 9"  (1.753 m), weight 68.5 kg, SpO2 98%.  PHYSICAL EXAMINATION:  Physical Exam  GENERAL:  89 y.o.-year-old Caucasian male patient lying in the bed with no acute distress.  The patient was globally confused and nonverbal. EYES: Pupils equal, round, reactive to light and accommodation. No scleral icterus. Extraocular muscles intact.  HEENT: Head atraumatic, normocephalic. Oropharynx and nasopharynx clear.  NECK:   Supple, no jugular venous distention. No thyroid enlargement, no tenderness.  LUNGS: Normal breath sounds bilaterally, no wheezing, rales,rhonchi or crepitation. No use of accessory muscles of respiration.  CARDIOVASCULAR: Regular rate and rhythm, S1, S2 normal. No murmurs, rubs, or gallops.  ABDOMEN: Soft, nondistended, nontender. Bowel sounds present. No organomegaly or mass.  EXTREMITIES: No pedal edema, cyanosis, or clubbing.  NEUROLOGIC: Cranial nerves II through XII are intact. Muscle strength 5/5 in all extremities. Sensation intact. Gait not checked.  PSYCHIATRIC: The patient is alert and nonverbal.  Flat affect with no good eye contact.   SKIN: No obvious rash, lesion, or ulcer.   LABORATORY PANEL:   CBC Recent Labs  Lab 09/28/23 1533  WBC 13.6*  HGB 12.1*  HCT 35.5*  PLT 170   ------------------------------------------------------------------------------------------------------------------  Chemistries  Recent  Labs  Lab 09/28/23 1533  NA 145  K 2.6*  CL 106  CO2 29  GLUCOSE 127*  BUN 25*  CREATININE 1.02  CALCIUM 9.3  MG 2.0  AST 43*  ALT 27  ALKPHOS 41  BILITOT 1.5*   ------------------------------------------------------------------------------------------------------------------  Cardiac Enzymes No results for input(s): "TROPONINI" in the last 168 hours. ------------------------------------------------------------------------------------------------------------------  RADIOLOGY:  CT ABDOMEN PELVIS W CONTRAST Addendum Date: 09/29/2023 ADDENDUM REPORT: 09/29/2023 00:37 ADDENDUM: Wrong template dictated under this exam due to technical confusion. CLINICAL DATA:  Abnormal CT, fluid adjacent to the bladder Examination: CT abdomen and pelvis with contrast TECHNIQUE: Multi detector CT imaging of the abdomen and pelvis performed following intravenous contrast. Delayed images of the kidneys were also obtained. CONTRAST:  80 mL Omnipaque 300 intravenous COMPARISON:   CT 09/28/2023 FINDINGS: Lung bases: See separately dictated chest CT Hepatobiliary: No calcified gallstones. Subcentimeter hypodensities within the left hepatic lobe too small to further characterize. Pancreas: No inflammatory change.  Atrophic Spleen: Normal in size.  No focal abnormality. Adrenal/urinary tract: Normal adrenal glands. No hydronephrosis. Decompressed urinary bladder with Foley catheter. Bladder is thick walled. Stomach/bowel: The stomach is decompressed. No dilated small bowel. No acute bowel wall thickening. Vascular/lymphatic: Advanced aortic atherosclerosis.  No aneurysm. Reproductive: Marked enlargement of the prostate Other: No free air. Calcified mesenteric mass as described on prior exam. Small volume hyperdense fluid anterior to the bladder Musculoskeletal: Old appearing right inferior pubic ramus fracture. Acute appearing mildly displaced right superior and inferior pubic ramus fractures. No pubic symphysis widening. IMPRESSION: 1. Acute on chronic appearing fractures involving the right superior and inferior pubic rami. Small amount of hyperdense fluid and stranding within the pre vesicle space and inguinal regions suspected to represent small amount of extraperitoneal blood/hematoma 2. Pelvic CT with delayed images through the bladder subsequently performed 3. Calcified mesenteric mass as previously described. Electronically Signed   By: Jasmine Pang M.D.   On: 09/29/2023 00:37   Result Date: 09/29/2023 CLINICAL DATA:  Abnormal CT, fluid adjacent to the bladder concern for bladder trauma EXAM: CT PELVIS WITHOUT CONTRAST TECHNIQUE: Multidetector CT imaging of the pelvis was performed following the standard protocol without additional intravenous contrast. Delayed imaging through the pelvis obtained following clamping of Foley catheter. RADIATION DOSE REDUCTION: This exam was performed according to the departmental dose-optimization program which includes automated exposure control,  adjustment of the mA and/or kV according to patient size and/or use of iterative reconstruction technique. COMPARISON:  CT 09/28/2023 FINDINGS: Urinary Tract: Foley catheter within the bladder which is partially decompressed. Bladder appears diffusely thick walled. There is no extraluminal extravasation of contrast within the pelvis. Bowel:  No acute bowel wall thickening Vascular/Lymphatic: Advanced aortic atherosclerosis. No aneurysm. No suspicious lymph nodes. Reproductive:  Very enlarged prostate Other: No pelvic free air. Partially visualized calcified mesenteric mass. Small volume slightly dense fluid within the left greater than right extraperitoneal space. Musculoskeletal: Chronic right inferior pubic rami fracture. More acute appearing right superior pubic ramus fracture and acute appearing right inferior pubic ramus fracture posteriorly. This likely accounts for the small amount of extraperitoneal hematoma. IMPRESSION: 1. Foley catheter within the bladder which is partially decompressed. Negative for extraluminal extravasation of contrast from the bladder. 2. Diffuse thick-walled appearance of the bladder either due to cystitis or chronic obstruction. 3. Acute appearing right superior and inferior pubic rami fractures superimposed on chronic right pubic ramus fractures. Small volume slightly dense fluid within the left greater than right extraperitoneal space likely due to small amount of  extraperitoneal hematoma. 4. Markedly enlarged prostate 5. Partially visualized calcified mesenteric mass., see previously performed CT chest abdomen pelvis report 6. Aortic atherosclerosis. Electronically Signed: By: Jasmine Pang M.D. On: 09/28/2023 22:16   CT PELVIS W CONTRAST Result Date: 09/29/2023 CLINICAL DATA:  Possible bladder injury EXAM: CT PELVIS WITH CONTRAST TECHNIQUE: Multidetector CT imaging of the pelvis was performed using the standard protocol following the bolus administration of intravenous  contrast. RADIATION DOSE REDUCTION: This exam was performed according to the departmental dose-optimization program which includes automated exposure control, adjustment of the mA and/or kV according to patient size and/or use of iterative reconstruction technique. CONTRAST:  80mL OMNIPAQUE IOHEXOL 300 MG/ML  SOLN COMPARISON:  CT 09/28/2023 FINDINGS: Urinary Tract: Foley catheter within the urinary bladder. Diffuse thick-walled appearance of the urinary bladder with trabeculation. No definite extravasation of contrast from the bladder. Bowel:  No acute bowel wall thickening Vascular/Lymphatic: Vascular calcification. No aneurysm. No suspicious lymph nodes. Reproductive:  Markedly enlarged prostate Other: No free air. Small volume slightly dense fluid within the extraperitoneal space. Musculoskeletal: Chronic right inferior pubic ramus fracture. More acute appearing right superior and inferior mildly displaced pubic rami fractures. IMPRESSION: 1. Foley catheter within the urinary bladder. Diffuse thick-walled appearance of the urinary bladder with trabeculation suggesting chronic outlet obstruction. No definite extraluminal extravasation of contrast from the bladder. 2. Small volume slightly dense fluid within the extraperitoneal space, suspect small volume extraperitoneal hematoma. 3. Acute appearing right superior and inferior pubic rami fractures. Chronic right inferior pubic ramus fracture. 4. Markedly enlarged prostate. Electronically Signed   By: Jasmine Pang M.D.   On: 09/29/2023 00:19   CT CHEST ABDOMEN PELVIS WO CONTRAST Result Date: 09/28/2023 CLINICAL DATA:  Found down.  Blunt trauma. EXAM: CT CHEST, ABDOMEN AND PELVIS WITHOUT CONTRAST TECHNIQUE: Multidetector CT imaging of the chest, abdomen and pelvis was performed following the standard protocol without IV contrast. RADIATION DOSE REDUCTION: This exam was performed according to the departmental dose-optimization program which includes automated  exposure control, adjustment of the mA and/or kV according to patient size and/or use of iterative reconstruction technique. COMPARISON:  09/22/2023 PET FINDINGS: CT CHEST FINDINGS Cardiovascular: Aortic atherosclerosis. Ascending aortic dilatation at 4.3 cm on 35/2. Tortuous thoracic aorta. Mild cardiomegaly, without pericardial effusion. Three vessel coronary artery calcification. No mediastinal hematoma. Mediastinum/Nodes: No mediastinal or hilar adenopathy, given limitations of unenhanced CT. Lungs/Pleura: No pleural fluid. No pneumothorax. No pulmonary contusion. Areas of subsegmental atelectasis in both lung bases. Left upper lobe scarring laterally. Minimal ground-glass in the right upper lobe with septal thickening could represent minimal fluid overload (32/4). Musculoskeletal: Included within the abdomen pelvic section. CT ABDOMEN PELVIS FINDINGS Hepatobiliary: Normal noncontrast appearance of the liver, gallbladder, biliary tract. Pancreas: Normal pancreas for age. No duct dilatation or acute inflammation. Spleen: In additional lack of IV contrast, degradation secondary to EKG wire and lead artifacts. Normal in size, without focal abnormality. Adrenals/Urinary Tract: Normal adrenal glands. No renal calculi or hydronephrosis. No hydroureter or ureteric calculi. The bladder is moderately distended. Stomach/Bowel: Normal stomach, without wall thickening. Large stool ball in the rectum. Normal small bowel. Vascular/Lymphatic: Advanced aortic and branch vessel atherosclerosis. Calcified small bowel mesenteric mass again identified at 4.0 x 2.9 cm on 72/2, similar to last week's PET. Reproductive: Moderate prostatomegaly. Other: No pelvic free fluid or free intraperitoneal air. Subtle possible fluid adjacent the distended bladder including at the entrance to the left inguinal canal including on 98/2. Musculoskeletal: Remote right pubic rami fractures. Remote bilateral rib fractures. Multiple  thoracolumbar  compression deformities. Most significant at T5, T9, T10 and thoracic spine. Present on 09/22/2023 PET. Mild L1 and L3 compression deformities are present on 03/02/2023 CT. IMPRESSION: 1. Multifactorial degradation, including lack of oral/IV contrast, EKG wires and leads. 2. Thoracic compression deformities were present on 09/22/2023 PET. No posttraumatic deformity in the chest. 3. Possible subtle fluid adjacent the left side of the distended urinary bladder. If there is significant pelvic trauma, such that bladder or bowel injury are concerns, consider contrast enhanced (oral and IV) pelvic CT. 4. Calcified mesenteric mass, similar to recent PET. 5. Ascending aortic aneurysm at 4.2 cm. If this 89 year old patient is a treatment candidate, this could be re-evaluated with dedicated CTA at 1 year. 6. Incidental findings, including: Coronary artery atherosclerosis. Aortic Atherosclerosis (ICD10-I70.0). Prostatomegaly with bladder distension. Electronically Signed   By: Jeronimo Greaves M.D.   On: 09/28/2023 17:55   CT HEAD WO CONTRAST ( ) Result Date: 09/28/2023 CLINICAL DATA:  Head trauma EXAM: CT HEAD WITHOUT CONTRAST CT CERVICAL SPINE WITHOUT CONTRAST TECHNIQUE: Multidetector CT imaging of the head and cervical spine was performed following the standard protocol without intravenous contrast. Multiplanar CT image reconstructions of the cervical spine were also generated. RADIATION DOSE REDUCTION: This exam was performed according to the departmental dose-optimization program which includes automated exposure control, adjustment of the mA and/or kV according to patient size and/or use of iterative reconstruction technique. COMPARISON:  CT brain and cervical spine 06/17/2022 FINDINGS: CT HEAD FINDINGS Brain: No acute territorial infarction, hemorrhage or intracranial mass. Moderate advanced atrophy. Mild chronic small vessel ischemic changes of the white matter. Stable ventricle size Vascular: No hyperdense vessels.   Carotid vascular calcification Skull: Normal. Negative for fracture or focal lesion. Sinuses/Orbits: Mucosal thickening in the sinuses Other: None CT CERVICAL SPINE FINDINGS Alignment: Stable trace anterolisthesis C4 on C5. Facet alignment is maintained. Skull base and vertebrae: No acute fracture. No primary bone lesion or focal pathologic process. Mild chronic appearing superior endplate deformity at T3. Soft tissues and spinal canal: No prevertebral fluid or swelling. No visible canal hematoma. Disc levels: Moderate severe disc space narrowing and degenerative change C4-C5, C5-C6 and C6-C7. Facet degenerative changes at multiple levels with foraminal narrowing. Upper chest: Negative. Other: None IMPRESSION: 1. No CT evidence for acute intracranial abnormality. Atrophy and chronic small vessel ischemic changes of the white matter. 2. Degenerative changes of the cervical spine. No acute osseous abnormality. Electronically Signed   By: Jasmine Pang M.D.   On: 09/28/2023 17:49   CT Cervical Spine Wo Contrast Result Date: 09/28/2023 CLINICAL DATA:  Head trauma EXAM: CT HEAD WITHOUT CONTRAST CT CERVICAL SPINE WITHOUT CONTRAST TECHNIQUE: Multidetector CT imaging of the head and cervical spine was performed following the standard protocol without intravenous contrast. Multiplanar CT image reconstructions of the cervical spine were also generated. RADIATION DOSE REDUCTION: This exam was performed according to the departmental dose-optimization program which includes automated exposure control, adjustment of the mA and/or kV according to patient size and/or use of iterative reconstruction technique. COMPARISON:  CT brain and cervical spine 06/17/2022 FINDINGS: CT HEAD FINDINGS Brain: No acute territorial infarction, hemorrhage or intracranial mass. Moderate advanced atrophy. Mild chronic small vessel ischemic changes of the white matter. Stable ventricle size Vascular: No hyperdense vessels.  Carotid vascular  calcification Skull: Normal. Negative for fracture or focal lesion. Sinuses/Orbits: Mucosal thickening in the sinuses Other: None CT CERVICAL SPINE FINDINGS Alignment: Stable trace anterolisthesis C4 on C5. Facet alignment is maintained. Skull base and vertebrae: No acute  fracture. No primary bone lesion or focal pathologic process. Mild chronic appearing superior endplate deformity at T3. Soft tissues and spinal canal: No prevertebral fluid or swelling. No visible canal hematoma. Disc levels: Moderate severe disc space narrowing and degenerative change C4-C5, C5-C6 and C6-C7. Facet degenerative changes at multiple levels with foraminal narrowing. Upper chest: Negative. Other: None IMPRESSION: 1. No CT evidence for acute intracranial abnormality. Atrophy and chronic small vessel ischemic changes of the white matter. 2. Degenerative changes of the cervical spine. No acute osseous abnormality. Electronically Signed   By: Jasmine Pang M.D.   On: 09/28/2023 17:49      IMPRESSION AND PLAN:  Assessment and Plan: * Acute encephalopathy - The patient will be admitted to the medical telemetry observation bed. - Will follow neurochecks every 4 hours for 24 hours. - We will obtain a brain MRI without contrast to rule out acute frontal CVA. - Will follow serial troponins.  Acute urinary retention - This could be contributing to his altered mental status. - He had a urine output of 2100 mL so far with catheterization.  Fall at home, initial encounter - This is associated with acute rhabdomyolysis. - It is unclear if the patient had a syncopal episode. - Will need to rule out CVA as mentioned above. - He sustained pelvic fracture with acute right superior and inferior pubic rami fractures. - Pain management will be provided. - PT consult will be obtained.  Hematoma of extraperitoneal space - This is clearly secondary to his fall. - We will monitor H&H. - General Surgery consult will be obtained for  follow-up. - I notified Dr. Maia Plan about the patient.  Hypokalemia Potassium will be replaced and magnesium level came back normal.  Essential hypertension Will continue antihypertensive therapy.  Dyslipidemia - We will continue statin therapy.         DVT prophylaxis: Lovenox.  Advanced Care Planning:  Code Status: full code.  Family Communication:  The plan of care was discussed in details with the patient (and family). I answered all questions. The patient agreed to proceed with the above mentioned plan. Further management will depend upon hospital course. Disposition Plan: Back to previous home environment Consults called: none.  All the records are reviewed and case discussed with ED provider.  Status is: Observation  I certify that at the time of admission, it is my clinical judgment that the patient will require  hospital care extending less than 2 midnights.                            Dispo: The patient is from: Home              Anticipated d/c is to: Home              Patient currently is not medically stable to d/c.              Difficult to place patient: No  Hannah Beat M.D on 09/29/2023 at 4:37 AM  [facility]   From 7 PM-7 AM, contact night-coverage [email or web address]  CC: Primary care physician; Dorcas Carrow, DO

## 2023-09-29 ENCOUNTER — Observation Stay

## 2023-09-29 ENCOUNTER — Ambulatory Visit: Payer: Self-pay | Admitting: Family Medicine

## 2023-09-29 ENCOUNTER — Encounter: Payer: Self-pay | Admitting: Radiology

## 2023-09-29 DIAGNOSIS — I10 Essential (primary) hypertension: Secondary | ICD-10-CM | POA: Diagnosis present

## 2023-09-29 DIAGNOSIS — Z79899 Other long term (current) drug therapy: Secondary | ICD-10-CM | POA: Diagnosis not present

## 2023-09-29 DIAGNOSIS — R338 Other retention of urine: Secondary | ICD-10-CM

## 2023-09-29 DIAGNOSIS — W19XXXA Unspecified fall, initial encounter: Secondary | ICD-10-CM

## 2023-09-29 DIAGNOSIS — T796XXA Traumatic ischemia of muscle, initial encounter: Secondary | ICD-10-CM | POA: Diagnosis present

## 2023-09-29 DIAGNOSIS — L89152 Pressure ulcer of sacral region, stage 2: Secondary | ICD-10-CM | POA: Diagnosis present

## 2023-09-29 DIAGNOSIS — I2510 Atherosclerotic heart disease of native coronary artery without angina pectoris: Secondary | ICD-10-CM | POA: Diagnosis present

## 2023-09-29 DIAGNOSIS — Y92009 Unspecified place in unspecified non-institutional (private) residence as the place of occurrence of the external cause: Secondary | ICD-10-CM

## 2023-09-29 DIAGNOSIS — E876 Hypokalemia: Secondary | ICD-10-CM | POA: Diagnosis present

## 2023-09-29 DIAGNOSIS — R319 Hematuria, unspecified: Secondary | ICD-10-CM | POA: Diagnosis present

## 2023-09-29 DIAGNOSIS — D649 Anemia, unspecified: Secondary | ICD-10-CM | POA: Diagnosis present

## 2023-09-29 DIAGNOSIS — N401 Enlarged prostate with lower urinary tract symptoms: Secondary | ICD-10-CM | POA: Diagnosis present

## 2023-09-29 DIAGNOSIS — G9340 Encephalopathy, unspecified: Secondary | ICD-10-CM | POA: Diagnosis not present

## 2023-09-29 DIAGNOSIS — Z515 Encounter for palliative care: Secondary | ICD-10-CM

## 2023-09-29 DIAGNOSIS — E870 Hyperosmolality and hypernatremia: Secondary | ICD-10-CM | POA: Diagnosis present

## 2023-09-29 DIAGNOSIS — R9431 Abnormal electrocardiogram [ECG] [EKG]: Secondary | ICD-10-CM | POA: Diagnosis present

## 2023-09-29 DIAGNOSIS — Z1152 Encounter for screening for COVID-19: Secondary | ICD-10-CM | POA: Diagnosis not present

## 2023-09-29 DIAGNOSIS — Z7982 Long term (current) use of aspirin: Secondary | ICD-10-CM | POA: Diagnosis not present

## 2023-09-29 DIAGNOSIS — R339 Retention of urine, unspecified: Secondary | ICD-10-CM | POA: Diagnosis present

## 2023-09-29 DIAGNOSIS — E785 Hyperlipidemia, unspecified: Secondary | ICD-10-CM | POA: Diagnosis present

## 2023-09-29 DIAGNOSIS — Z602 Problems related to living alone: Secondary | ICD-10-CM | POA: Diagnosis present

## 2023-09-29 DIAGNOSIS — Z66 Do not resuscitate: Secondary | ICD-10-CM | POA: Diagnosis not present

## 2023-09-29 DIAGNOSIS — S329XXA Fracture of unspecified parts of lumbosacral spine and pelvis, initial encounter for closed fracture: Secondary | ICD-10-CM | POA: Diagnosis not present

## 2023-09-29 DIAGNOSIS — K683 Retroperitoneal hematoma: Secondary | ICD-10-CM

## 2023-09-29 DIAGNOSIS — S32591A Other specified fracture of right pubis, initial encounter for closed fracture: Secondary | ICD-10-CM | POA: Diagnosis present

## 2023-09-29 DIAGNOSIS — G9341 Metabolic encephalopathy: Secondary | ICD-10-CM | POA: Diagnosis present

## 2023-09-29 DIAGNOSIS — I69351 Hemiplegia and hemiparesis following cerebral infarction affecting right dominant side: Secondary | ICD-10-CM | POA: Diagnosis not present

## 2023-09-29 DIAGNOSIS — R532 Functional quadriplegia: Secondary | ICD-10-CM | POA: Diagnosis present

## 2023-09-29 DIAGNOSIS — R1310 Dysphagia, unspecified: Secondary | ICD-10-CM | POA: Diagnosis present

## 2023-09-29 DIAGNOSIS — Z87891 Personal history of nicotine dependence: Secondary | ICD-10-CM | POA: Diagnosis not present

## 2023-09-29 LAB — CBC
HCT: 35.8 % — ABNORMAL LOW (ref 39.0–52.0)
Hemoglobin: 12.2 g/dL — ABNORMAL LOW (ref 13.0–17.0)
MCH: 33.7 pg (ref 26.0–34.0)
MCHC: 34.1 g/dL (ref 30.0–36.0)
MCV: 98.9 fL (ref 80.0–100.0)
Platelets: 170 10*3/uL (ref 150–400)
RBC: 3.62 MIL/uL — ABNORMAL LOW (ref 4.22–5.81)
RDW: 13.5 % (ref 11.5–15.5)
WBC: 14.5 10*3/uL — ABNORMAL HIGH (ref 4.0–10.5)
nRBC: 0 % (ref 0.0–0.2)

## 2023-09-29 LAB — MAGNESIUM: Magnesium: 2.1 mg/dL (ref 1.7–2.4)

## 2023-09-29 LAB — BASIC METABOLIC PANEL WITH GFR
Anion gap: 9 (ref 5–15)
BUN: 20 mg/dL (ref 8–23)
CO2: 26 mmol/L (ref 22–32)
Calcium: 9.1 mg/dL (ref 8.9–10.3)
Chloride: 111 mmol/L (ref 98–111)
Creatinine, Ser: 0.92 mg/dL (ref 0.61–1.24)
GFR, Estimated: >60 mL/min (ref >60)
Glucose, Bld: 85 mg/dL (ref 70–99)
Potassium: 3 mmol/L — ABNORMAL LOW (ref 3.5–5.1)
Sodium: 146 mmol/L — ABNORMAL HIGH (ref 135–145)

## 2023-09-29 LAB — TROPONIN I (HIGH SENSITIVITY)
Troponin I (High Sensitivity): 60 ng/L — ABNORMAL HIGH (ref <18)
Troponin I (High Sensitivity): 53 ng/L — ABNORMAL HIGH (ref <18)

## 2023-09-29 LAB — CK: Total CK: 544 U/L — ABNORMAL HIGH (ref 49–397)

## 2023-09-29 MED ORDER — POTASSIUM CHLORIDE 20 MEQ PO PACK
40.0000 meq | PACK | Freq: Once | ORAL | Status: AC
  Administered 2023-09-29: 40 meq via ORAL
  Filled 2023-09-29: qty 2

## 2023-09-29 MED ORDER — KCL IN DEXTROSE-NACL 40-5-0.45 MEQ/L-%-% IV SOLN
INTRAVENOUS | Status: AC
  Filled 2023-09-29 (×2): qty 1000

## 2023-09-29 MED ORDER — PROSIGHT PO TABS
1.0000 | ORAL_TABLET | Freq: Every day | ORAL | Status: DC
  Administered 2023-09-29 – 2023-10-05 (×7): 1 via ORAL
  Filled 2023-09-29 (×7): qty 1

## 2023-09-29 MED ORDER — TRAMADOL HCL 50 MG PO TABS
50.0000 mg | ORAL_TABLET | Freq: Four times a day (QID) | ORAL | Status: DC | PRN
  Administered 2023-09-30 – 2023-10-05 (×4): 50 mg via ORAL
  Filled 2023-09-29 (×4): qty 1

## 2023-09-29 MED ORDER — CHLORHEXIDINE GLUCONATE CLOTH 2 % EX PADS
6.0000 | MEDICATED_PAD | Freq: Every day | CUTANEOUS | Status: DC
  Administered 2023-09-29 – 2023-10-05 (×7): 6 via TOPICAL

## 2023-09-29 MED ORDER — METHYLPREDNISOLONE SODIUM SUCC 40 MG IJ SOLR
20.0000 mg | Freq: Every day | INTRAMUSCULAR | Status: DC
  Administered 2023-09-29 – 2023-10-01 (×3): 20 mg via INTRAVENOUS
  Filled 2023-09-29 (×3): qty 1

## 2023-09-29 MED ORDER — HYDRALAZINE HCL 20 MG/ML IJ SOLN
10.0000 mg | Freq: Four times a day (QID) | INTRAMUSCULAR | Status: DC | PRN
  Administered 2023-09-29 – 2023-10-02 (×3): 10 mg via INTRAVENOUS
  Filled 2023-09-29 (×3): qty 1

## 2023-09-29 MED ORDER — MORPHINE SULFATE (PF) 2 MG/ML IV SOLN
2.0000 mg | INTRAVENOUS | Status: DC | PRN
  Administered 2023-09-29 – 2023-10-01 (×3): 2 mg via INTRAVENOUS
  Filled 2023-09-29 (×3): qty 1

## 2023-09-29 NOTE — Progress Notes (Signed)
 Progress Note   Gross: Nicholas Gross GNF:[id number] DOB: [birth year] DOA: 09/28/2023     0 DOS: Nicholas Gross was seen and examined on 09/29/2023   Brief hospital course:  Nicholas Gross is a 89 y.o. Caucasian male with medical history significant for coronary artery disease, hypertension CVA with residual right-sided weakness and dyslipidemia, who presented to Nicholas emergency room with acute onset of confusion.  Nicholas Gross lives alone in a trailer and ambulates with a walker.  According to his niece he was last seen normal Nicholas day prior to his admission at about 2 PM.  On Nicholas day of admission at 2 PM his neighbor found him on Nicholas ground under a table before calling EMS.  Nicholas Gross is able to ambulate with a walker at baseline.  He was unable to give much history apart from stating his name.  It is unclear how long Nicholas Gross had been on Nicholas ground for.   ED Course: When Nicholas Gross came to [facility] ER, BP was 208/82 with otherwise normal vital signs.  Labs revealed hypokalemia of 2.6 and a BUN of 25 and AST 43 with total bili of 5.8 and total bili 1.5.  Total CK was 719 and high sensitive troponin I was 52 >> 21.  CBC showed leukocytosis 13.6 with neutrophilia as well as mild anemia respiratory panel came back negative.  UA came back negative.       Assessment and Plan:  * Acute encephalopathy Unclear etiology. ??  Gross has been seen by neurology for possible parkinsonism versus essential tremor due to right-sided action tremor. Has some speech difficulties, slow speech but his niece who was at Nicholas bedside notes that it is slower and more slurred compared to his baseline. MRI of Nicholas brain without contrast showed no acute intracranial finding. Aging brain and asymmetric left hippocampal atrophy.   Dysphagia Noted to have dysphagia with solids which seems to be an ongoing problem Speech therapy evaluation    Acute urinary retention without infection Foley catheter was inserted in Nicholas  ER with drainage of 2100 mL of urine We will start Gross on Flomax Will likely discharge home with Foley catheter to follow-up with urology as an outpatient    Fall at home, initial encounter Associated with acute rhabdomyolysis. Gross sustained pelvic fracture with acute right superior and inferior pubic rami fractures. Orthopedic surgery consult Pain management  PT consult will be obtained when Gross is able      Hypokalemia Supplement potassium    Essential hypertension Continue lisinopril    Hypernatremia Most likely iatrogenic Change IV fluids to D5 half-normal saline Repeat levels in a.m.     Subjective: Gross is awake and oriented to person, not to place or time. Per RN "Nicholas clearest he has been since admission"  Physical Exam: Vitals:   09/29/23 0846 09/29/23 1015 09/29/23 1100 09/29/23 1200  BP: (!) 161/72 (!) 174/71 (!) 166/83 (!) 136/57  Pulse: 74 69 63 (!) 59  Resp: 18 18 18  (!) 26  Temp:      TempSrc:      SpO2: 100% 100% 100% 100%  Weight:      Height:       GENERAL:  89 y.o.-year-old Caucasian male Gross lying in Nicholas bed with no acute distress.  Oriented to person.  Able to answer simple questions but speech is hard to understand EYES: Pupils equal, round, reactive to light and accommodation. No scleral icterus. Extraocular muscles intact.  HEENT: Head atraumatic,  normocephalic. Oropharynx and nasopharynx clear.  NECK:  Supple, no jugular venous distention. No thyroid enlargement, no tenderness.  LUNGS: Normal breath sounds bilaterally, no wheezing, rales,rhonchi or crepitation. No use of accessory muscles of respiration.  CARDIOVASCULAR: Regular rate and rhythm, S1, S2 normal. No murmurs, rubs, or gallops.  ABDOMEN: Soft, nondistended, nontender. Bowel sounds present. No organomegaly or mass.  EXTREMITIES: No pedal edema, cyanosis, or clubbing.  NEUROLOGIC: Cranial nerves II through XII are intact. Muscle strength 5/5 in all extremities.  Sensation intact. Gait not checked.  PSYCHIATRIC: Nicholas Gross is alert and oriented to person.   SKIN: No obvious rash, lesion, or ulcer.      Data Reviewed: Labs reviewed.  Sodium 146, potassium 3.0, total CK5 44, troponin 60 There are no new results to review at this time.  Family Communication: Plan of care was discussed with Gross's niece at Nicholas bedside.  Gross does not have a designated healthcare power of attorney and does not have children.  His sister and niece have been involved with his care.  She verbalizes understanding and agrees with Nicholas plan.  Disposition: Status is: Observation Nicholas Gross remains OBS appropriate and will d/c before 2 midnights.  Planned Discharge Destination: Skilled nursing facility    Time spent: 40 minutes  Author: Lucile Shutters, MD 09/29/2023 12:12 PM  For on call review [email or web address].

## 2023-09-29 NOTE — Assessment & Plan Note (Addendum)
-   This is clearly secondary to his fall. - We will monitor H&H. - General Surgery consult will be obtained for follow-up. - I notified Dr. Maia Plan about the patient.

## 2023-09-29 NOTE — Progress Notes (Signed)
 Patient admitted from ED via bed in stable condition.patient is alert and oriented X 1. Pt has a stage 2 pressure ulcer on sacrum. He is in NPO. IVF is going on as order.

## 2023-09-29 NOTE — Evaluation (Addendum)
 Clinical/Bedside Swallow Evaluation Patient Details  Name: Nicholas Gross MRN: [id number] Date of Birth: 23-May-1928  Today's Date: 09/29/2023 Time: SLP Start Time (ACUTE ONLY): 1415 SLP Stop Time (ACUTE ONLY): 1455 SLP Time Calculation (min) (ACUTE ONLY): 40 min  Past Medical History:  Past Medical History:  Diagnosis Date   CAD (coronary artery disease)    History of GI bleed 2008   History of tobacco use    17 pack years, quit around 1970   Hyperlipidemia    Hypertension    Overweight    Past Surgical History:  Past Surgical History:  Procedure Laterality Date   CAROTID ENDARTERECTOMY Left 2010   CATARACT EXTRACTION     cypher stent  09/12/02   s/p cypher stent mid-LAD   TENDON REPAIR  1991   finger and arm   HPI:  "Nicholas Gross is a 89 y.o. Caucasian male with medical history significant for coronary artery disease, hypertension CVA with residual right-sided weakness and dyslipidemia, who presented to the emergency room with acute onset of confusion.  The patient lives alone in a trailer and ambulates with a walker.  According to his niece he was last seen normal the day prior to his admission at about 2 PM.  On the day of admission at 2 PM his neighbor found him on the ground under a table before calling EMS.  The patient is able to ambulate with a walker at baseline.  He was unable to give much history apart from stating his name.  It is unclear how long the patient had been on the ground for." MRI Brain 4/8: No acute intracranial finding.  2. Aging brain and asymmetric left hippocampal atrophy.    Assessment / Plan / Recommendation  Clinical Impression  Pt seen for bedside swallow assessment in the setting of AMS secondary to Acute encephalopathy. Pt lethargic, with delayed responses. Speech with low intensity and reduced precision. Per neurology note 07/08/23, "slower, sometimes difficult to understand. No reported cognitive or memory concerns." Today, MRI was negative  for acute injury. Pt on room air with even breath pattern. Pt with bilateral mittens in place. Therapist removed mitten for brief period to facilitate engagement for feeding/holding cup, replaced by end of session. [name] sister in the room, who reported difficulty with solids PTA. Pt seen by SLP during admission to [facility] 04/17/23 with report of globus sensation with solids and recommendation for GI consult. Consult not yet completed per chart review.       Pt seen with trials of ice chips, thin liquid (via tsp and cup), and nectar thick liquids (via cup). Pt with multiple indicators for aspiration/dysphagia with all thin liquid/nectar liquid trials, including multiple swallows, audible swallow (concerning for coordination impairment), wet vocal quality, immediate/delayed cough. No overt improvement with thickened trials. Oral phase notable to reduced labial seal on cup and prolonged transit. Suspect that pt current mentation (lethargic, delayed responses, reduced attention to task) all impact oral manipulation of boluses. Education shared with sister regarding rationale for recommendations and provision of ice for comfort/conditioning if pt is alert/asking for drink. Sister reported understanding.       Given age, s/sx of aspiration during trials, mentation, and current lethargy, pt is at increased risk for aspiration. Recommend NPO with ice when alert/following oral care. RN and MD aware of recommendations. SLP will continue to follow pending further discussion of GOC (palliative consult pending).  SLP Visit Diagnosis: Dysphagia, oropharyngeal phase (R13.12) (impacted by current mentation)    Aspiration  Risk  Moderate aspiration risk    Diet Recommendation   NPO  Medication Administration: Via alternative means    Other  Recommendations Recommended Consults: Consider GI evaluation (as per recommendation in Oct 2024) Oral Care Recommendations: Oral care QID;Oral care prior to ice chip/H20     Recommendations for follow up therapy are one component of a multi-disciplinary discharge planning process, led by the attending physician.  Recommendations may be updated based on patient status, additional functional criteria and insurance authorization.  Follow up Recommendations Follow physician's recommendations for discharge plan and follow up therapies      Assistance Recommended at Discharge  Assist and supervision for PO intake  Functional Status Assessment Patient has had a recent decline in their functional status and/or demonstrates limited ability to make significant improvements in function in a reasonable and predictable amount of time  Frequency and Duration min 2x/week  2 weeks       Prognosis Prognosis for improved oropharyngeal function: Fair Barriers to Reach Goals: Motivation;Cognitive deficits (age)      Swallow Study   General Date of Onset: 09/29/23 HPI: "Nicholas Gross is a 89 y.o. Caucasian male with medical history significant for coronary artery disease, hypertension CVA with residual right-sided weakness and dyslipidemia, who presented to the emergency room with acute onset of confusion.  The patient lives alone in a trailer and ambulates with a walker.  According to his niece he was last seen normal the day prior to his admission at about 2 PM.  On the day of admission at 2 PM his neighbor found him on the ground under a table before calling EMS.  The patient is able to ambulate with a walker at baseline.  He was unable to give much history apart from stating his name.  It is unclear how long the patient had been on the ground for." MRI Brain 4/8: No acute intracranial finding.  2. Aging brain and asymmetric left hippocampal atrophy. Type of Study: Bedside Swallow Evaluation Previous Swallow Assessment: [facility] BSE completed in Oct 2024- rec regular solids and thin liquids Diet Prior to this Study: NPO Temperature Spikes Noted: No Respiratory Status: Room  air History of Recent Intubation: No Behavior/Cognition: Lethargic/Drowsy;Distractible;Requires cueing Oral Cavity Assessment: Within Functional Limits Oral Care Completed by SLP: Yes Oral Cavity - Dentition: Dentures, top;Dentures, bottom Vision:  (not tested) Self-Feeding Abilities: Needs assist;Needs set up (hand over hand for use of cup) Patient Positioning: Upright in bed Baseline Vocal Quality: Low vocal intensity Volitional Cough: Cognitively unable to elicit Volitional Swallow: Unable to elicit    Oral/Motor/Sensory Function Overall Oral Motor/Sensory Function: Within functional limits   Ice Chips Ice chips: Impaired Presentation: Spoon Oral Phase Impairments: Reduced lingual movement/coordination;Poor awareness of bolus Oral Phase Functional Implications: Prolonged oral transit Pharyngeal Phase Impairments: Multiple swallows   Thin Liquid Thin Liquid: Impaired Presentation: Cup;Spoon Oral Phase Impairments: Reduced labial seal;Reduced lingual movement/coordination;Poor awareness of bolus Oral Phase Functional Implications: Prolonged oral transit Pharyngeal  Phase Impairments: Throat Clearing - Immediate;Cough - Immediate;Wet Vocal Quality;Multiple swallows;Suspected delayed Swallow    Nectar Thick Nectar Thick Liquid: Impaired Presentation: Spoon Oral Phase Impairments: Poor awareness of bolus;Reduced lingual movement/coordination;Reduced labial seal Oral phase functional implications: Prolonged oral transit Pharyngeal Phase Impairments: Multiple swallows;Wet Vocal Quality;Cough - Immediate;Throat Clearing - Immediate;Suspected delayed Swallow   Honey Thick Honey Thick Liquid: Not tested   Puree Puree: Not tested   Solid     Solid: Not tested     [place] Tequila Rottmann Clapp, MS, CCC-SLP  Speech Language Pathologist Rehab Services; [facility] [place] [phone] (ascom)   [place] J Clapp 09/29/2023,3:06 PM

## 2023-09-29 NOTE — Assessment & Plan Note (Addendum)
 -  We will continue statin therapy.

## 2023-09-29 NOTE — Plan of Care (Signed)
 When I was introducing myself to pt, niece Babette Relic was present.  She said he has a skin condition called bullous pemphigoid which causes severe itching and can lead to blisters.  Is seen by Dr. Inis Sizer, dermatologist at [facility].  She said while he doesn't c/o pain, he will c/o itching.  Pt is currently NPO for lethargy (pending speech eval). Pt has been on prednisone for this condition and supported by doxycycline.  Pt refuses dupilomab b/c of cost.  Since he can't take PO meds, reached out to on call MD explaining the situation and he ordered 20 mg solumedrol.  We're also using clobetasol ointment for relief.  BP has been running high. Hydralazine PRN ordered for SBP > 160 and DBP >100.  While looking in pt's chart, found DNR form signed today by Palliative NP.  Read note and in ED, pt had been clear headed enough to make wishes known.  He was still listed as a full code so reached out to on call and he made pt a DNR-Limited.

## 2023-09-29 NOTE — Assessment & Plan Note (Signed)
-   The patient will be admitted to the medical telemetry observation bed. - Will follow neurochecks every 4 hours for 24 hours. - We will obtain a brain MRI without contrast to rule out acute frontal CVA. - Will follow serial troponins.

## 2023-09-29 NOTE — Assessment & Plan Note (Addendum)
-   This is associated with acute rhabdomyolysis. - It is unclear if the patient had a syncopal episode. - Will need to rule out CVA as mentioned above. - He sustained pelvic fracture with acute right superior and inferior pubic rami fractures. - Pain management will be provided. - PT consult will be obtained.

## 2023-09-29 NOTE — Assessment & Plan Note (Signed)
-   Potassium will be replaced and magnesium level came back normal. ?

## 2023-09-29 NOTE — Consult Note (Signed)
 Consultation Note Date: 09/29/2023 at 1300  Patient Name: Nicholas Gross  DOB: [birth year]  MRN: [id number]  Age / Sex: 89 y.o., male  PCP: Dorcas Carrow, DO Referring Physician: Lucile Shutters, MD  HPI/Patient Profile: 89 y.o. male  with past medical history of CAD, HTN, CVA (right side residual), dyslipidemia, former smoker, and mesenteric mass admitted on 09/28/2023 after being found down in his home by his neighbor.  Patient is being treated for acute rhabdomyolysis, acute encephalopathy, acute urinary retention, pelvic fracture, hematoma of extraperitoneal space, and hypokalemia.  PMT was consulted to support patient and family with goals of care discussions.   Clinical Assessment and Goals of Care: Extensive chart review completed prior to meeting patient including labs, vital signs, imaging, progress notes, orders, and available advanced directive documents from current and previous encounters. I then met with patient, his sister Lanora Manis, and his niece/Elizabeth daughter Babette Relic to discuss diagnosis prognosis, GOC, EOL wishes, disposition and options.  I introduced Palliative Medicine as specialized medical care for people living with serious illness. It focuses on providing relief from the symptoms and stress of a serious illness. The goal is to improve quality of life for both the patient and the family.  Patient was arousable with physical stimulation.  He was able to say yes and no appropriately to a few questions but remains sleepy/lethargic throughout the entirety of our discussion.  He is unable to participate in goals of care medical decision making independently at this time.  Symptoms assessed.  Patient was able to confirm he is in pain and localized it to his hips.  Reviewed MAR.  Advised RN to give a dose of Tylenol now.  Should this not relieve pain, tramadol should be given.  We  discussed a brief life review of the patient.  Patient was married and was widowed approximately 5 years ago.  They had no children.  Patient and Babette Relic are 2 of 6 children in the last 2 living siblings.  Patient worked the [employer] of his adult career in the [employer].  He lives alone, has a neighbor do grocery shopping for him, and his niece Tammy checks on him almost daily.  As far as functional and nutritional status patient was independent with all ADLs PTA.  Tammy endorses that patient was able to care for himself up until this hospitalization.  She saw him on Sunday and he was feeling well, eating a popsicle.  We discussed patient's current illness-acute encephalopathy, rhabdomyolysis, pelvic fracture-and what it means in the larger context of patient's on-going co-morbidities-advanced age, frail state.  Extensive education provided on rhabdomyolysis, encephalopathy, pelvic fracture morbidity.   Discussed patient's functional, nutritional, and cognitive status as significant indicators of his overall prognosis.  Reviewed orthopedics recommendations for no surgical intervention, PT/OT, with chance to heal in 6 to 8 weeks potentially.  Discussed SLP's evaluation and n.p.o. status with ice chips until further evaluation can be performed when mentation is improved.  Best and worst case scenario of post hip fracture and advanced  age discussed with family at bedside.  Space and opportunity provided for family to share thoughts and emotions regarding patient's current medical situation.  I attempted to elicit values and goals of care important to the patient.  Both Sister Lanora Manis and niece Babette Relic were clear that patient would never want aggressive or artificial measures to sustain his life.  Difference tween full code and DNR with limited interventions reviewed in detail.  Both are in agreement that patient would want to be a DNR with limited interventions given that he would like to "go along with  the Lord if he is calling him".  CODE STATUS changed to DNR in chart.  Attending and RN made aware.  [place] form completed, sent to medical records for download to electronic chart, and placed in patient's shadow chart.  Education offered regarding concept specific to human mortality and the limitations of medical interventions to prolong life when the body begins to fail to thrive.  Reviewed treating the treatable and allowing time for outcomes.  Family was appreciative of my visit and in agreement for me to follow.  I am off service tomorrow but plan to see them Thursday.  Should acute palliative needs arise before Thursday, please utilize [employer] for available PMT providers.  Primary Decision Maker NEXT OF KIN  Physical Exam Vitals reviewed.  Constitutional:      General: He is not in acute distress.    Appearance: He is normal weight. He is not ill-appearing.  HENT:     Head: Normocephalic.     Mouth/Throat:     Mouth: Mucous membranes are moist.  Eyes:     Pupils: Pupils are equal, round, and reactive to light.  Pulmonary:     Effort: Pulmonary effort is normal.  Abdominal:     Palpations: Abdomen is soft.  Skin:    General: Skin is warm and dry.  Neurological:     Mental Status: He is alert.     Comments: Oriented to self  Psychiatric:        Mood and Affect: Mood normal.        Behavior: Behavior normal.     Palliative Assessment/Data: 30%     Thank you for this consult. Palliative medicine will continue to follow and assist holistically.   Time Total: 75 minutes  Time spent includes: Detailed review of medical records (labs, imaging, vital signs), medically appropriate exam (mental status, respiratory, cardiac, skin), discussed with treatment team, counseling and educating patient, family and staff, documenting clinical information, medication management and coordination of care.  Signed by: Georgiann Cocker, DNP, FNP-BC Palliative Medicine   Please contact  Palliative Medicine Team providers via [facility] for questions and concerns.

## 2023-09-29 NOTE — Assessment & Plan Note (Addendum)
-   This could be contributing to his altered mental status. - He had a urine output of 2100 mL so far with catheterization.

## 2023-09-29 NOTE — Assessment & Plan Note (Signed)
Will continue antihypertensive therapy.

## 2023-09-29 NOTE — Progress Notes (Signed)
 [name] niece states that patient lives alone but she is concernred that he needs more assistance and possibly SNF. She also states that he is legally blind and has had multiple falls over the past few months.

## 2023-09-29 NOTE — Progress Notes (Incomplete)
"  CORDERRO KOLOSKI is a 89 y.o. Caucasian male with medical history significant for coronary artery disease, hypertension CVA with residual right-sided weakness and dyslipidemia, who presented to the emergency room with acute onset of confusion.  The patient lives alone in a trailer and ambulates with a walker.  According to his niece he was last seen normal the day prior to his admission at about 2 PM.  On the day of admission at 2 PM his neighbor found him on the ground under a table before calling EMS.  The patient is able to ambulate with a walker at baseline.  He was unable to give much history apart from stating his name.  It is unclear how long the patient had been on the ground for." MRI Brain 4/8: No acute intracranial finding. 2. Aging brain and asymmetric left hippocampal atrophy.  Pt lethargic, with delayed responses. Speech with low intensity and reduced precision. Per neurology note 07/08/23, "slower, sometimes difficult to understand. No reported cognitive or memory concerns." Today, MRI was negative for acute injury. Pt on room air with even breath pattern.

## 2023-09-29 NOTE — Consult Note (Signed)
 ORTHOPAEDIC CONSULTATION  REQUESTING PHYSICIAN: Lucile Shutters, MD  Chief Complaint:   Right hip/groin pain.  History of Present Illness: Nicholas Gross is a 89 y.o. male with multiple medical problems including hypertension, hyperlipidemia, coronary artery disease, and history of GI bleed who normally lives alone and independently.  Apparently, the patient was seen by his daughter around 2 PM on Sunday, at which time he was doing well, then found on the floor by his neighbor at 2 PM on Monday.  The patient was brought to the emergency room unable to give a coherent history as to why he fell, how long he had been down, or how much anything might hurt.  Therefore, a comprehensive CT scanning evaluation of his head, neck, chest, abdomen, and pelvis was performed.  Among other things, the scans demonstrated essentially nondisplaced superior and inferior pubic rami fractures on the right, prompting this orthopedic consultation.  The patient is a poor historian and so cannot state why he fell or whether there were any associated injuries resulting from his fall.  Past Medical History:  Diagnosis Date   CAD (coronary artery disease)    History of GI bleed 2008   History of tobacco use    17 pack years, quit around 1970   Hyperlipidemia    Hypertension    Overweight    Past Surgical History:  Procedure Laterality Date   CAROTID ENDARTERECTOMY Left 2010   CATARACT EXTRACTION     cypher stent  09/12/02   s/p cypher stent mid-LAD   TENDON REPAIR  1991   finger and arm   Social History   Socioeconomic History   Marital status: Widowed    Spouse name: Not on file   Number of children: Not on file   Years of education: some college in [employer]    Highest education level: High school graduate  Occupational History   Occupation: retired   Tobacco Use   Smoking status: Former    Current packs/day: 0.00    Average packs/day: 1  pack/day for 17.0 years (17.0 ttl pk-yrs)    Types: Cigarettes    Start date: 06/24/1951    Quit date: 06/23/1968    Years since quitting: 55.3   Smokeless tobacco: Never  Vaping Use   Vaping status: Never Used  Substance and Sexual Activity   Alcohol use: No   Drug use: No   Sexual activity: Not Currently  Other Topics Concern   Not on file  Social History Narrative   Attends church, neighbor take him    Social Drivers of Health   Financial Resource Strain: Low Risk  (04/20/2023)   Received from [facility]   Overall Financial Resource Strain (CARDIA)    Difficulty of Paying Living Expenses: Not very hard  Food Insecurity: No Food Insecurity (06/05/2023)   Hunger Vital Sign    Worried About Running Out of Food in the Last Year: Never true    Ran Out of Food in the Last Year: Never true  Transportation Needs: No Transportation Needs (06/05/2023)   PRAPARE - Administrator, Civil Service (Medical): No    Lack of Transportation (Non-Medical): No  Physical Activity: Unknown (05/13/2021)   Exercise Vital Sign    Days of Exercise per Week: 2 days    Minutes of Exercise per Session: Not on file  Stress: No Stress Concern Present (05/13/2021)   [employer] of Occupational Health - Occupational Stress Questionnaire    Feeling of Stress : Not  at all  Social Connections: Moderately Isolated (07/07/2022)   Social Connection and Isolation Panel [NHANES]    Frequency of Communication with Friends and Family: More than three times a week    Frequency of Social Gatherings with Friends and Family: Twice a week    Attends Religious Services: More than 4 times per year    Active Member of [employer] or Organizations: No    Attends Banker Meetings: Never    Marital Status: Widowed   Family History  Problem Relation Age of Onset   Heart disease Father        possibly   Cancer Sister        breast   AAA (abdominal aortic aneurysm) Brother    Cancer Sister         lung   Allergies  Allergen Reactions   Other     Patient states that he is allergic to something that they place in the IV before they work on you   Simvastatin Other (See Comments)    Myalgia    Prior to Admission medications   Medication Sig Start Date End Date Taking? Authorizing Provider  aspirin EC 81 MG tablet Take 81 mg by mouth daily. Swallow whole.   Yes [provider]  clobetasol ointment (TEMOVATE) 0.05 % Apply the medication twice daily to stubborn areas of the skin until smooth. Then stop and re-start as the skin changes come back. 05/28/23  Yes [provider]  cyanocobalamin (VITAMIN B12) 250 MCG tablet Take by mouth.   Yes [provider]  doxycycline (VIBRA-TABS) 100 MG tablet Take 100 mg by mouth 2 (two) times daily. 09/17/23 02/14/24 Yes [provider]  furosemide (LASIX) 40 MG tablet Take 1 tablet (40 mg total) by mouth daily. 07/06/23  Yes Johnson, Megan P, DO  lisinopril (ZESTRIL) 5 MG tablet Take 1 tablet (5 mg total) by mouth daily. 07/06/23  Yes Johnson, Megan P, DO  Multiple Vitamins-Minerals (PRESERVISION AREDS PO) Take 1 tablet by mouth 2 (two) times daily.   Yes [provider]  rosuvastatin (CRESTOR) 10 MG tablet Take 1 tablet (10 mg total) by mouth daily. 07/06/23  Yes Johnson, Megan P, DO  valACYclovir (VALTREX) 1000 MG tablet Take 1,000 mg by mouth 3 (three) times daily. 07/23/23  Yes [provider]  Dupilumab 300 MG/2ML SOAJ Inject into the skin. Patient not taking: Reported on 09/29/2023 07/23/23   [provider]  White Petrolatum (WHITE PETROLEUM JELLY) GEL Apply topically. 04/24/23 04/23/24  [provider]   MR BRAIN WO CONTRAST Result Date: 09/29/2023 CLINICAL DATA:  Mental status change with unknown cause EXAM: MRI HEAD WITHOUT CONTRAST TECHNIQUE: Multiplanar, multiecho pulse sequences of the brain and surrounding structures were obtained without intravenous contrast. COMPARISON:  Head  CT from yesterday FINDINGS: Brain: No acute infarction, hemorrhage, hydrocephalus, extra-axial collection or mass lesion. Periventricular FLAIR hyperintensity attributed to mild chronic small vessel ischemia. Cerebral volume loss which is also overall mild for age. There is asymmetric atrophy of the medial left temporal lobe including the hippocampus with increased T2 signal, also seen in 2023, of questionable significance with no chart history of epilepsy. Vascular: Major flow voids are preserved. Skull and upper cervical spine: No focal marrow lesion. Posterior scalp swelling asymmetric to the left Sinuses/Orbits: No acute finding. IMPRESSION: 1. No acute intracranial finding. 2. Aging brain and asymmetric left hippocampal atrophy. Electronically Signed   By: Tiburcio Pea M.D.   On: 09/29/2023 05:34   CT  ABDOMEN PELVIS W CONTRAST Addendum Date: 09/29/2023 ADDENDUM REPORT: 09/29/2023 00:37 ADDENDUM: Wrong template dictated under this exam due to technical confusion. CLINICAL DATA:  Abnormal CT, fluid adjacent to the bladder Examination: CT abdomen and pelvis with contrast TECHNIQUE: Multi detector CT imaging of the abdomen and pelvis performed following intravenous contrast. Delayed images of the kidneys were also obtained. CONTRAST:  80 mL Omnipaque 300 intravenous COMPARISON:  CT 09/28/2023 FINDINGS: Lung bases: See separately dictated chest CT Hepatobiliary: No calcified gallstones. Subcentimeter hypodensities within the left hepatic lobe too small to further characterize. Pancreas: No inflammatory change.  Atrophic Spleen: Normal in size.  No focal abnormality. Adrenal/urinary tract: Normal adrenal glands. No hydronephrosis. Decompressed urinary bladder with Foley catheter. Bladder is thick walled. Stomach/bowel: The stomach is decompressed. No dilated small bowel. No acute bowel wall thickening. Vascular/lymphatic: Advanced aortic atherosclerosis.  No aneurysm. Reproductive: Marked enlargement of the  prostate Other: No free air. Calcified mesenteric mass as described on prior exam. Small volume hyperdense fluid anterior to the bladder Musculoskeletal: Old appearing right inferior pubic ramus fracture. Acute appearing mildly displaced right superior and inferior pubic ramus fractures. No pubic symphysis widening. IMPRESSION: 1. Acute on chronic appearing fractures involving the right superior and inferior pubic rami. Small amount of hyperdense fluid and stranding within the pre vesicle space and inguinal regions suspected to represent small amount of extraperitoneal blood/hematoma 2. Pelvic CT with delayed images through the bladder subsequently performed 3. Calcified mesenteric mass as previously described. Electronically Signed   By: Jasmine Pang M.D.   On: 09/29/2023 00:37   Result Date: 09/29/2023 CLINICAL DATA:  Abnormal CT, fluid adjacent to the bladder concern for bladder trauma EXAM: CT PELVIS WITHOUT CONTRAST TECHNIQUE: Multidetector CT imaging of the pelvis was performed following the standard protocol without additional intravenous contrast. Delayed imaging through the pelvis obtained following clamping of Foley catheter. RADIATION DOSE REDUCTION: This exam was performed according to the departmental dose-optimization program which includes automated exposure control, adjustment of the mA and/or kV according to patient size and/or use of iterative reconstruction technique. COMPARISON:  CT 09/28/2023 FINDINGS: Urinary Tract: Foley catheter within the bladder which is partially decompressed. Bladder appears diffusely thick walled. There is no extraluminal extravasation of contrast within the pelvis. Bowel:  No acute bowel wall thickening Vascular/Lymphatic: Advanced aortic atherosclerosis. No aneurysm. No suspicious lymph nodes. Reproductive:  Very enlarged prostate Other: No pelvic free air. Partially visualized calcified mesenteric mass. Small volume slightly dense fluid within the left greater than  right extraperitoneal space. Musculoskeletal: Chronic right inferior pubic rami fracture. More acute appearing right superior pubic ramus fracture and acute appearing right inferior pubic ramus fracture posteriorly. This likely accounts for the small amount of extraperitoneal hematoma. IMPRESSION: 1. Foley catheter within the bladder which is partially decompressed. Negative for extraluminal extravasation of contrast from the bladder. 2. Diffuse thick-walled appearance of the bladder either due to cystitis or chronic obstruction. 3. Acute appearing right superior and inferior pubic rami fractures superimposed on chronic right pubic ramus fractures. Small volume slightly dense fluid within the left greater than right extraperitoneal space likely due to small amount of extraperitoneal hematoma. 4. Markedly enlarged prostate 5. Partially visualized calcified mesenteric mass., see previously performed CT chest abdomen pelvis report 6. Aortic atherosclerosis. Electronically Signed: By: Jasmine Pang M.D. On: 09/28/2023 22:16   CT PELVIS W CONTRAST Result Date: 09/29/2023 CLINICAL DATA:  Possible bladder injury EXAM: CT PELVIS WITH CONTRAST TECHNIQUE: Multidetector CT imaging of the pelvis was performed using the standard  protocol following the bolus administration of intravenous contrast. RADIATION DOSE REDUCTION: This exam was performed according to the departmental dose-optimization program which includes automated exposure control, adjustment of the mA and/or kV according to patient size and/or use of iterative reconstruction technique. CONTRAST:  80mL OMNIPAQUE IOHEXOL 300 MG/ML  SOLN COMPARISON:  CT 09/28/2023 FINDINGS: Urinary Tract: Foley catheter within the urinary bladder. Diffuse thick-walled appearance of the urinary bladder with trabeculation. No definite extravasation of contrast from the bladder. Bowel:  No acute bowel wall thickening Vascular/Lymphatic: Vascular calcification. No aneurysm. No suspicious  lymph nodes. Reproductive:  Markedly enlarged prostate Other: No free air. Small volume slightly dense fluid within the extraperitoneal space. Musculoskeletal: Chronic right inferior pubic ramus fracture. More acute appearing right superior and inferior mildly displaced pubic rami fractures. IMPRESSION: 1. Foley catheter within the urinary bladder. Diffuse thick-walled appearance of the urinary bladder with trabeculation suggesting chronic outlet obstruction. No definite extraluminal extravasation of contrast from the bladder. 2. Small volume slightly dense fluid within the extraperitoneal space, suspect small volume extraperitoneal hematoma. 3. Acute appearing right superior and inferior pubic rami fractures. Chronic right inferior pubic ramus fracture. 4. Markedly enlarged prostate. Electronically Signed   By: Jasmine Pang M.D.   On: 09/29/2023 00:19   CT CHEST ABDOMEN PELVIS WO CONTRAST Result Date: 09/28/2023 CLINICAL DATA:  Found down.  Blunt trauma. EXAM: CT CHEST, ABDOMEN AND PELVIS WITHOUT CONTRAST TECHNIQUE: Multidetector CT imaging of the chest, abdomen and pelvis was performed following the standard protocol without IV contrast. RADIATION DOSE REDUCTION: This exam was performed according to the departmental dose-optimization program which includes automated exposure control, adjustment of the mA and/or kV according to patient size and/or use of iterative reconstruction technique. COMPARISON:  09/22/2023 PET FINDINGS: CT CHEST FINDINGS Cardiovascular: Aortic atherosclerosis. Ascending aortic dilatation at 4.3 cm on 35/2. Tortuous thoracic aorta. Mild cardiomegaly, without pericardial effusion. Three vessel coronary artery calcification. No mediastinal hematoma. Mediastinum/Nodes: No mediastinal or hilar adenopathy, given limitations of unenhanced CT. Lungs/Pleura: No pleural fluid. No pneumothorax. No pulmonary contusion. Areas of subsegmental atelectasis in both lung bases. Left upper lobe scarring  laterally. Minimal ground-glass in the right upper lobe with septal thickening could represent minimal fluid overload (32/4). Musculoskeletal: Included within the abdomen pelvic section. CT ABDOMEN PELVIS FINDINGS Hepatobiliary: Normal noncontrast appearance of the liver, gallbladder, biliary tract. Pancreas: Normal pancreas for age. No duct dilatation or acute inflammation. Spleen: In additional lack of IV contrast, degradation secondary to EKG wire and lead artifacts. Normal in size, without focal abnormality. Adrenals/Urinary Tract: Normal adrenal glands. No renal calculi or hydronephrosis. No hydroureter or ureteric calculi. The bladder is moderately distended. Stomach/Bowel: Normal stomach, without wall thickening. Large stool ball in the rectum. Normal small bowel. Vascular/Lymphatic: Advanced aortic and branch vessel atherosclerosis. Calcified small bowel mesenteric mass again identified at 4.0 x 2.9 cm on 72/2, similar to last week's PET. Reproductive: Moderate prostatomegaly. Other: No pelvic free fluid or free intraperitoneal air. Subtle possible fluid adjacent the distended bladder including at the entrance to the left inguinal canal including on 98/2. Musculoskeletal: Remote right pubic rami fractures. Remote bilateral rib fractures. Multiple thoracolumbar compression deformities. Most significant at T5, T9, T10 and thoracic spine. Present on 09/22/2023 PET. Mild L1 and L3 compression deformities are present on 03/02/2023 CT. IMPRESSION: 1. Multifactorial degradation, including lack of oral/IV contrast, EKG wires and leads. 2. Thoracic compression deformities were present on 09/22/2023 PET. No posttraumatic deformity in the chest. 3. Possible subtle fluid adjacent the left side of the  distended urinary bladder. If there is significant pelvic trauma, such that bladder or bowel injury are concerns, consider contrast enhanced (oral and IV) pelvic CT. 4. Calcified mesenteric mass, similar to recent PET. 5.  Ascending aortic aneurysm at 4.2 cm. If this 89 year old patient is a treatment candidate, this could be re-evaluated with dedicated CTA at 1 year. 6. Incidental findings, including: Coronary artery atherosclerosis. Aortic Atherosclerosis (ICD10-I70.0). Prostatomegaly with bladder distension. Electronically Signed   By: Jeronimo Greaves M.D.   On: 09/28/2023 17:55   CT HEAD WO CONTRAST ( ) Result Date: 09/28/2023 CLINICAL DATA:  Head trauma EXAM: CT HEAD WITHOUT CONTRAST CT CERVICAL SPINE WITHOUT CONTRAST TECHNIQUE: Multidetector CT imaging of the head and cervical spine was performed following the standard protocol without intravenous contrast. Multiplanar CT image reconstructions of the cervical spine were also generated. RADIATION DOSE REDUCTION: This exam was performed according to the departmental dose-optimization program which includes automated exposure control, adjustment of the mA and/or kV according to patient size and/or use of iterative reconstruction technique. COMPARISON:  CT brain and cervical spine 06/17/2022 FINDINGS: CT HEAD FINDINGS Brain: No acute territorial infarction, hemorrhage or intracranial mass. Moderate advanced atrophy. Mild chronic small vessel ischemic changes of the white matter. Stable ventricle size Vascular: No hyperdense vessels.  Carotid vascular calcification Skull: Normal. Negative for fracture or focal lesion. Sinuses/Orbits: Mucosal thickening in the sinuses Other: None CT CERVICAL SPINE FINDINGS Alignment: Stable trace anterolisthesis C4 on C5. Facet alignment is maintained. Skull base and vertebrae: No acute fracture. No primary bone lesion or focal pathologic process. Mild chronic appearing superior endplate deformity at T3. Soft tissues and spinal canal: No prevertebral fluid or swelling. No visible canal hematoma. Disc levels: Moderate severe disc space narrowing and degenerative change C4-C5, C5-C6 and C6-C7. Facet degenerative changes at multiple levels with  foraminal narrowing. Upper chest: Negative. Other: None IMPRESSION: 1. No CT evidence for acute intracranial abnormality. Atrophy and chronic small vessel ischemic changes of the white matter. 2. Degenerative changes of the cervical spine. No acute osseous abnormality. Electronically Signed   By: Jasmine Pang M.D.   On: 09/28/2023 17:49   CT Cervical Spine Wo Contrast Result Date: 09/28/2023 CLINICAL DATA:  Head trauma EXAM: CT HEAD WITHOUT CONTRAST CT CERVICAL SPINE WITHOUT CONTRAST TECHNIQUE: Multidetector CT imaging of the head and cervical spine was performed following the standard protocol without intravenous contrast. Multiplanar CT image reconstructions of the cervical spine were also generated. RADIATION DOSE REDUCTION: This exam was performed according to the departmental dose-optimization program which includes automated exposure control, adjustment of the mA and/or kV according to patient size and/or use of iterative reconstruction technique. COMPARISON:  CT brain and cervical spine 06/17/2022 FINDINGS: CT HEAD FINDINGS Brain: No acute territorial infarction, hemorrhage or intracranial mass. Moderate advanced atrophy. Mild chronic small vessel ischemic changes of the white matter. Stable ventricle size Vascular: No hyperdense vessels.  Carotid vascular calcification Skull: Normal. Negative for fracture or focal lesion. Sinuses/Orbits: Mucosal thickening in the sinuses Other: None CT CERVICAL SPINE FINDINGS Alignment: Stable trace anterolisthesis C4 on C5. Facet alignment is maintained. Skull base and vertebrae: No acute fracture. No primary bone lesion or focal pathologic process. Mild chronic appearing superior endplate deformity at T3. Soft tissues and spinal canal: No prevertebral fluid or swelling. No visible canal hematoma. Disc levels: Moderate severe disc space narrowing and degenerative change C4-C5, C5-C6 and C6-C7. Facet degenerative changes at multiple levels with foraminal narrowing. Upper  chest: Negative. Other: None IMPRESSION: 1. No CT evidence for  acute intracranial abnormality. Atrophy and chronic small vessel ischemic changes of the white matter. 2. Degenerative changes of the cervical spine. No acute osseous abnormality. Electronically Signed   By: Jasmine Pang M.D.   On: 09/28/2023 17:49    Positive ROS: All other systems have been reviewed and were otherwise negative with the exception of those mentioned in the HPI and as above.  Physical Exam: General:  Alert, no acute distress Psychiatric:  Patient is not competent for consent   Cardiovascular:  No pedal edema Respiratory:  No wheezing, non-labored breathing GI:  Abdomen is soft and non-tender Skin:  No lesions in the area of chief complaint Neurologic:  Sensation intact distally Lymphatic:  No axillary or cervical lymphadenopathy  Orthopedic Exam:  Orthopedic examination is limited to the right hip and lower extremity.  The right and left lower extremities are aligned symmetrically.  Skin inspection around the right hip is unremarkable.  No swelling, erythema, ecchymosis, abrasions, or other skin abnormalities are identified.  He has mild-moderate focal tenderness to palpation over the right groin region, but there are no other areas of tenderness around the right hip or thigh.  He is able to tolerate logrolling of the right hip with only mild discomfort.  He has more severe pain when he attempts to actively lift his leg.  He is grossly neurovascularly intact to the right lower extremity and foot.  X-rays:  A recent CT scan of the pelvis is available for review and has been reviewed by myself.  The findings are as described above.  Assessment: Essentially nondisplaced right superior and inferior pubic rami fractures.  Plan: The treatment options have been discussed with the patient and his daughter who is at the bedside.  I have explained to them that this injury can be managed without surgery.  The fractures  should go on to heal without difficulty, but it may take 6 to 8 weeks for them to fully heal and for him to become sufficiently comfortable to resume full weightbearing.  Meanwhile, he may begin to be mobilized with physical therapy, weightbearing as tolerated on the right leg, using a walker for balance and support.  Given that he normally lives alone at home, most likely he will require skilled nursing facility placement for the next few weeks.  He and his family may also want to begin to discuss finding an assisted living facility for him on a more permanent basis.  Thank you for asking me to participate in the care of this most unfortunate man.  I will be happy to follow him with you.   Maryagnes Amos, MD  Beeper #:  [phone]  09/29/2023 1:10 PM

## 2023-09-30 DIAGNOSIS — E870 Hyperosmolality and hypernatremia: Secondary | ICD-10-CM | POA: Diagnosis not present

## 2023-09-30 DIAGNOSIS — L8990 Pressure ulcer of unspecified site, unspecified stage: Secondary | ICD-10-CM | POA: Insufficient documentation

## 2023-09-30 DIAGNOSIS — G9340 Encephalopathy, unspecified: Secondary | ICD-10-CM | POA: Diagnosis not present

## 2023-09-30 LAB — BASIC METABOLIC PANEL WITH GFR
Anion gap: 7 (ref 5–15)
BUN: 20 mg/dL (ref 8–23)
CO2: 27 mmol/L (ref 22–32)
Calcium: 8.8 mg/dL — ABNORMAL LOW (ref 8.9–10.3)
Chloride: 114 mmol/L — ABNORMAL HIGH (ref 98–111)
Creatinine, Ser: 0.91 mg/dL (ref 0.61–1.24)
GFR, Estimated: >60 mL/min (ref >60)
Glucose, Bld: 123 mg/dL — ABNORMAL HIGH (ref 70–99)
Potassium: 3.4 mmol/L — ABNORMAL LOW (ref 3.5–5.1)
Sodium: 148 mmol/L — ABNORMAL HIGH (ref 135–145)

## 2023-09-30 LAB — CBC
HCT: 34.6 % — ABNORMAL LOW (ref 39.0–52.0)
Hemoglobin: 11.6 g/dL — ABNORMAL LOW (ref 13.0–17.0)
MCH: 33.3 pg (ref 26.0–34.0)
MCHC: 33.5 g/dL (ref 30.0–36.0)
MCV: 99.4 fL (ref 80.0–100.0)
Platelets: 161 10*3/uL (ref 150–400)
RBC: 3.48 MIL/uL — ABNORMAL LOW (ref 4.22–5.81)
RDW: 13.6 % (ref 11.5–15.5)
WBC: 12.4 10*3/uL — ABNORMAL HIGH (ref 4.0–10.5)
nRBC: 0.2 % (ref 0.0–0.2)

## 2023-09-30 LAB — CK: Total CK: 200 U/L (ref 49–397)

## 2023-09-30 MED ORDER — ENSURE ENLIVE PO LIQD
237.0000 mL | Freq: Two times a day (BID) | ORAL | Status: DC
Start: 2023-09-30 — End: 2023-10-05
  Administered 2023-09-30 – 2023-10-05 (×8): 237 mL via ORAL

## 2023-09-30 MED ORDER — DEXTROSE 5 % IV SOLN: INTRAVENOUS | Status: AC

## 2023-09-30 NOTE — Progress Notes (Signed)
   09/29/23 2125  Assess: MEWS Score  Level of Consciousness Responds to Pain  Assess: MEWS Score  MEWS Temp 0  MEWS Systolic 0  MEWS Pulse 0  MEWS RR 0  MEWS LOC 2  MEWS Score 2  MEWS Score Color Yellow  Assess: if the MEWS score is Yellow or Red  Were vital signs accurate and taken at a resting state? Yes  Does the patient meet 2 or more of the SIRS criteria? No  MEWS guidelines implemented  Yes, yellow  Treat  MEWS Interventions Considered administering scheduled or prn medications/treatments as ordered  Take Vital Signs  Increase Vital Sign Frequency  Yellow: Q2hr x1, continue Q4hrs until patient remains green for 12hrs  Escalate  MEWS: Escalate Yellow: Discuss with charge nurse and consider notifying provider and/or RRT  Notify: Charge Nurse/RN  Name of Charge Nurse/RN Notified Annice Pih, RN  Provider Notification  Provider Name/Title Valente David, MD  Date Provider Notified 09/29/23  Time Provider Notified 2100  Method of Notification  (secure chat)  Provider response See new orders   Pt was able to convey that he was in pain.  After admin of pain ;med, his BP was much better. Will continue to monitor.

## 2023-09-30 NOTE — Plan of Care (Signed)
 [name]

## 2023-09-30 NOTE — TOC Initial Note (Signed)
 Transition of Care [facility]) - Initial/Assessment Note    Patient Details  Name: Nicholas Gross MRN: [id number] Date of Birth: 07-Jan-1928  [facility]) CM/SW Contact:    Cherre Blanc, RN Phone Number: 09/30/2023, 12:03 PM  Clinical Narrative:                 Patient is from home and lives alone. Patient was found in his home on the ground by a neighbor. Patient will need to go to a SNF when he is medically clear.  Patient is noted to have a PCP, Olevia Perches.  TOC placed call to the patient's niece Tammy [phone]  and left a voicemail requesting call back.   TOC will complete FL2 and send out when therapy notes are available.  TOC will continue to follow.    Expected Discharge Plan: Skilled Nursing Facility Barriers to Discharge: Continued Medical Work up   Patient Goals and CMS Choice            Expected Discharge Plan and Services   Discharge Planning Services: CM Consult   Living arrangements for the past 2 months: Single Family Home                                      Prior Living Arrangements/Services Living arrangements for the past 2 months: Single Family Home Lives with:: Self                   Activities of Daily Living   ADL Screening (condition at time of admission) Independently performs ADLs?: No Does the patient have a NEW difficulty with bathing/dressing/toileting/self-feeding that is expected to last >3 days?: Yes (Initiates electronic notice to provider for possible OT consult) Does the patient have a NEW difficulty with getting in/out of bed, walking, or climbing stairs that is expected to last >3 days?: Yes (Initiates electronic notice to provider for possible PT consult) Does the patient have a NEW difficulty with communication that is expected to last >3 days?: Yes (Initiates electronic notice to provider for possible SLP consult) Is the patient deaf or have difficulty hearing?: No Does the patient have  difficulty seeing, even when wearing glasses/contacts?: Yes Does the patient have difficulty concentrating, remembering, or making decisions?: No  Permission Sought/Granted                  Emotional Assessment       Orientation: : Oriented to Self   Psych Involvement: No (comment)  Admission diagnosis:  Hypokalemia [E87.6] Urinary retention [R33.9] Acute encephalopathy [G93.40] Closed nondisplaced fracture of pelvis, unspecified part of pelvis, initial encounter (HCC) [S32.9XXA] Patient Active Problem List   Diagnosis Date Noted   Hypernatremia 09/30/2023   Pressure injury of skin 09/30/2023   Fall at home, initial encounter 09/29/2023   Acute urinary retention 09/29/2023   Essential hypertension 09/29/2023   Hypokalemia 09/29/2023   Hematoma of extraperitoneal space 09/29/2023   Acute encephalopathy 09/28/2023   Weight loss 06/05/2023   Bullous disorder 06/01/2023   Mesenteric mass 06/01/2023   Dysarthria 04/17/2023   Tremor 04/17/2023   Aphasia 04/10/2023   Dysphagia 04/10/2023   History of falling 03/24/2023   Long term (current) use of aspirin 03/24/2023   Occlusion and stenosis of unspecified carotid artery 03/24/2023   Pain and swelling of lower leg 09/15/2022   Right acetabular fracture (HCC) 06/17/2022   Aortic stenosis, mild  04/25/2022   Cellulitis of both lower extremities 04/24/2022   Aneurysm of ascending aorta without rupture (HCC) 12/23/2021   Compression fracture of L1 lumbar vertebra (HCC) 12/23/2021   Pulmonary nodule 12/23/2021   Right inguinal hernia 12/23/2021   Osteoarthritis of right hip 12/23/2021   Aortic atherosclerosis (HCC) 12/23/2021   Multiple falls 12/12/2021   Left rib fracture 12/12/2021   Senile purpura (HCC) 11/08/2021   PAD (peripheral artery disease) (HCC) 07/15/2020   Lymphedema 05/06/2019   Dyslipidemia 02/13/2016   Macular degeneration 08/15/2015   Carotid atherosclerosis 08/15/2015   Allergic rhinitis due to pollen  02/12/2015   Hypertension    CAD (coronary artery disease)    H/O cardiac catheterization 06/12/2014   History of left-sided carotid endarterectomy 06/12/2014   PCP:  Dorcas Carrow, DO Pharmacy:   [facility] - [place], [place] - [address] [place] [address] Phone: [phone] Fax: [phone]     Social Drivers of Health (SDOH) Social History: SDOH Screenings   Food Insecurity: Patient Declined (09/29/2023)  Housing: Patient Declined (09/29/2023)  Transportation Needs: Patient Declined (09/29/2023)  Utilities: Patient Declined (09/29/2023)  Alcohol Screen: Low Risk  (05/13/2021)  Depression (PHQ2-9): Low Risk  (07/06/2023)  Recent Concern: Depression (PHQ2-9) - Medium Risk (06/01/2023)  Financial Resource Strain: Low Risk  (04/20/2023)   Received from [facility]  Physical Activity: Unknown (05/13/2021)  Social Connections: Patient Declined (09/29/2023)  Stress: No Stress Concern Present (05/13/2021)  Tobacco Use: Medium Risk (09/21/2023)   Received from [facility]   SDOH Interventions:     Readmission Risk Interventions     No data to display

## 2023-09-30 NOTE — Progress Notes (Addendum)
 Progress Note   Patient: Nicholas Gross AVW:[id number] DOB: Jan 15, 1928 DOA: 09/28/2023     1 DOS: the patient was seen and examined on 09/30/2023   Brief hospital course:  Nicholas Gross is a 89 y.o. Caucasian male with medical history significant for coronary artery disease, hypertension CVA with residual right-sided weakness and dyslipidemia, who presented to the emergency room with acute onset of confusion.  The patient lives alone in a trailer and ambulates with a walker.  According to his niece he was last seen normal the day prior to his admission at about 2 PM.  On the day of admission at 2 PM his neighbor found him on the ground under a table before calling EMS.  The patient is able to ambulate with a walker at baseline.  He was unable to give much history apart from stating his name.  It is unclear how long the patient had been on the ground for.   ED Course: When the patient came to the ER, BP was 208/82 with otherwise normal vital signs.  Labs revealed hypokalemia of 2.6 and a BUN of 25 and AST 43 with total bili of 5.8 and total bili 1.5.  Total CK was 719 and high sensitive troponin I was 52 >> 21.  CBC showed leukocytosis 13.6 with neutrophilia as well as mild anemia respiratory panel came back negative.  UA came back negative.      Assessment and Plan:   * Acute encephalopathy Unclear etiology. Improving. More awake and alert. Speech remains slow and difficult to understand ??  Patient has been seen by neurology for possible parkinsonism versus essential tremor due to right-sided action tremor. MRI of the brain without contrast showed no acute intracranial finding. Aging brain and asymmetric left hippocampal atrophy.     Dysphagia Noted to have dysphagia with solids which seems to be an ongoing problem Appreciate speech therapy input.  Patient noted to have moderate aspiration risk.  Recommends n.p.o. until patient is more awake. Patient is more awake this morning and will  ask speech to reassess.     Acute urinary retention without infection Foley catheter was inserted in the ER with drainage of 2100 mL of urine We will start patient on Flomax Will likely discharge home with Foley catheter to follow-up with urology as an outpatient     Fall at home, initial encounter Associated with acute rhabdomyolysis which has improved with IVF hydration Patient sustained pelvic fracture with acute right superior and inferior pubic rami fractures. Appreciate orthopedic surgery input.  Recommends nonsurgical management.  Patient would need at least 6 to 8 weeks for fractures to heal and for him to become sufficiently comfortable to resume full weightbearing Pain management  PT consult.  Patient may weight-bear as tolerated on his right leg and uses a walker for balance and support. Pain control as needed TOC consult for nursing home placement    Hypokalemia Supplement potassium     Essential hypertension Continue lisinopril     Hypernatremia Most likely iatrogenic Administer free water Repeat sodium levels in am         Subjective: More awake.  Sister is at the bedside  Physical Exam: Vitals:   09/29/23 2200 09/29/23 2358 09/30/23 0538 09/30/23 0751  BP: (!) 149/60 (!) 128/59 (!) 183/77 118/61  Pulse: 68 (!) 58 (!) 55 69  Resp:   18 17  Temp:   (!) 97.4 F (36.3 C) 97.6 F (36.4 C)  TempSrc:    Oral  SpO2:  99% 100% 100%  Weight:      Height:       GENERAL:  89 y.o.-year-old Caucasian male patient lying in the bed with no acute distress.  Awake, alert and oriented to person.  Able to answer simple questions but speech is hard to understand EYES: Pupils equal, round, reactive to light and accommodation. No scleral icterus. Extraocular muscles intact.  HEENT: Head atraumatic, normocephalic. Oropharynx and nasopharynx clear.  NECK:  Supple, no jugular venous distention. No thyroid enlargement, no tenderness.  LUNGS: Normal breath sounds  bilaterally, no wheezing, rales,rhonchi or crepitation. No use of accessory muscles of respiration.  CARDIOVASCULAR: Regular rate and rhythm, S1, S2 normal. No murmurs, rubs, or gallops.  ABDOMEN: Soft, nondistended, nontender. Bowel sounds present. No organomegaly or mass.  EXTREMITIES: No pedal edema, cyanosis, or clubbing.  NEUROLOGIC: Cranial nerves II through XII are intact. Muscle strength 5/5 in all extremities. Sensation intact. Gait not checked.  PSYCHIATRIC: The patient is alert and oriented to person.   SKIN: No obvious rash, lesion, or ulcer.   Data Reviewed: Sodium 148, potassium 3.4, white count 12.4 Labs reviewed  Family Communication: Plan of care discussed with patient's sister at the bedside.  All questions and concerns have been addressed.  She verbalizes understanding and agrees to the plan  Disposition: Status is: Inpatient Remains inpatient appropriate because: Will need SNF placement  Planned Discharge Destination: Skilled nursing facility    Time spent: 38  minutes  Author: Lucile Shutters, MD 09/30/2023 11:42 AM  For on call review [email or web address].

## 2023-09-30 NOTE — Progress Notes (Signed)
 Speech Language Pathology Treatment: Dysphagia  Patient Details Name: Nicholas Gross MRN: [id number] DOB: Oct 23, 1927 Today's Date: 09/30/2023 Time: 1205-1300 SLP Time Calculation (min) (ACUTE ONLY): 55 min  Assessment / Plan / Recommendation Clinical Impression  Pt seen for ongoing assessment of swallowing; trials to assess appropriateness for diet upgrade. Per chart notes and MD, pt is much more alert today; he engaged verbally, though slow responses. He was able to participate in self-feeding by holding Cup to drink. He followed basic commands w/ Cue.  On RA, afebrile. WBC trending down.  Pt appears to present w/ grossly functional oropharyngeal phase swallowing w/ No immediate, overt clinical s/s of aspiration noted during po trials. Pt presents w/ risk for aspiration/aspiration pneumonia in setting current comorbidities but this risk can be reduced by following general aspiration precautions and giving him feeding support during meals; 100% Supervision.  Pt has challenging factors that could impact oropharyngeal swallowing to include significant deconditioning/weakness, concern for Neurological issues(per chart), Esophageal phase Dysmotility-(Seen by SLP at [facility] 04/17/23- reporting globus sensation with solids- recommending GI follow up), vision deficits, and advanced age as well as current hospitalization. These factors can increase risk for aspiration, dysphagia as well as decreased oral intake overall.   During po trials, pt consumed all consistencies w/ no overt coughing, decline in vocal quality, or change in respiratory presentation during/post trials. Pt consumed 12/12 trials of thin liquids via Cup w/ no overt coughing noted, though suspected velopharyngeal incompetency was noted during some swallows. O2 sats remained 96-97% when checked during. Oral phase appeared grossly [facility] w/ timely bolus management, mastication, and control of bolus propulsion for A-P transfer for swallowing. Min  slower mastication w/ the increased textured trials but wfl when moistened well. Oral clearing achieved w/ all trial consistencies -- moistened, soft foods and Time given. Pt helped to feed self by [employer], which improves safety of swallowing. Practiced swallowing Pills WHOLE in Puree w/ NSG present; tolerated well.  OF NOTE: pt has LOOSE Dentures -- may need adhesive to secure them?  Recommend a mech soft consistency diet w/ Minced meats, well-moistened foods; Thin liquids -- carefully monitor, and pt should help to Hold Cup when drinking. NO straws. Recommend general aspiration precautions, 100% support and Supervision at meals. Small sips and bites, Slowly. Pills WHOLE in Puree for safer, easier swallowing -- it was encouraged now and for D/C to the pt and Sister.   Education given on Pills in Puree; food consistencies and easy to eat options; general aspiration precautions to pt and Sister. ST services will monitor pt's toleration of diet and any further needs; education. NSG updated, agreed. MD updated. Recommend Dietician and Palliative Care f/u for support.      HPI HPI: "Nicholas Gross is a 89 y.o. Caucasian male with medical history significant for coronary artery disease, hypertension CVA with residual right-sided weakness and dyslipidemia, who presented to the emergency room with acute onset of confusion.  The patient lives alone in a trailer and ambulates with a walker.  According to his niece he was last seen normal the day prior to his admission at about 2 PM.  On the day of admission at 2 PM his neighbor found him on the ground under a table before calling EMS.  The patient is able to ambulate with a walker at baseline.  He was unable to give much history apart from stating his name.  It is unclear how long the patient had been on the ground for." MRI Brain  4/8: No acute intracranial finding.  2. Aging brain and asymmetric left hippocampal atrophy.      SLP Plan  Continue with  current plan of care      Recommendations for follow up therapy are one component of a multi-disciplinary discharge planning process, led by the attending physician.  Recommendations may be updated based on patient status, additional functional criteria and insurance authorization.    Recommendations  Diet recommendations: Dysphagia 3 (mechanical soft);Thin liquid (MINCED meats, gravies) Liquids provided via: Cup;No straw Medication Administration: Whole meds with puree Supervision: Patient able to self feed;Staff to assist with self feeding;Full supervision/cueing for compensatory strategies Compensations: Minimize environmental distractions;Slow rate;Small sips/bites;Lingual sweep for clearance of pocketing;Multiple dry swallows after each bite/sip;Follow solids with liquid Postural Changes and/or Swallow Maneuvers: Out of bed for meals;Seated upright 90 degrees;Upright 30-60 min after meal (Reflux precs.)                 (Palliative Care f/u for support/ed; Dietician f/u) Oral care BID;Oral care before and after PO;Staff/trained caregiver to provide oral care ([employer] care)   Frequent or constant Supervision/Assistance Dysphagia, oropharyngeal phase (R13.12) (deconditioned; medical comorbidities)     Continue with current plan of care       Jerilynn Som, MS, CCC-SLP Speech Language Pathologist Rehab Services; [facility] [phone] (ascom) Delphine Sizemore  09/30/2023, 4:44 PM

## 2023-09-30 NOTE — Plan of Care (Signed)

## 2023-10-01 ENCOUNTER — Inpatient Hospital Stay: Payer: Medicare Other | Attending: Oncology | Admitting: Oncology

## 2023-10-01 DIAGNOSIS — S329XXA Fracture of unspecified parts of lumbosacral spine and pelvis, initial encounter for closed fracture: Secondary | ICD-10-CM | POA: Diagnosis not present

## 2023-10-01 DIAGNOSIS — E876 Hypokalemia: Secondary | ICD-10-CM | POA: Diagnosis not present

## 2023-10-01 DIAGNOSIS — G9340 Encephalopathy, unspecified: Secondary | ICD-10-CM | POA: Diagnosis not present

## 2023-10-01 DIAGNOSIS — R339 Retention of urine, unspecified: Secondary | ICD-10-CM | POA: Diagnosis not present

## 2023-10-01 LAB — CBC
HCT: 27.9 % — ABNORMAL LOW (ref 39.0–52.0)
Hemoglobin: 9.3 g/dL — ABNORMAL LOW (ref 13.0–17.0)
MCH: 33.2 pg (ref 26.0–34.0)
MCHC: 33.3 g/dL (ref 30.0–36.0)
MCV: 99.6 fL (ref 80.0–100.0)
Platelets: 134 10*3/uL — ABNORMAL LOW (ref 150–400)
RBC: 2.8 MIL/uL — ABNORMAL LOW (ref 4.22–5.81)
RDW: 13.4 % (ref 11.5–15.5)
WBC: 12.4 10*3/uL — ABNORMAL HIGH (ref 4.0–10.5)
nRBC: 0 % (ref 0.0–0.2)

## 2023-10-01 LAB — BASIC METABOLIC PANEL WITH GFR
Anion gap: 6 (ref 5–15)
BUN: 32 mg/dL — ABNORMAL HIGH (ref 8–23)
CO2: 26 mmol/L (ref 22–32)
Calcium: 8.5 mg/dL — ABNORMAL LOW (ref 8.9–10.3)
Chloride: 112 mmol/L — ABNORMAL HIGH (ref 98–111)
Creatinine, Ser: 1.02 mg/dL (ref 0.61–1.24)
GFR, Estimated: >60 mL/min (ref >60)
Glucose, Bld: 96 mg/dL (ref 70–99)
Potassium: 3.4 mmol/L — ABNORMAL LOW (ref 3.5–5.1)
Sodium: 144 mmol/L (ref 135–145)

## 2023-10-01 LAB — CK: Total CK: 114 U/L (ref 49–397)

## 2023-10-01 MED ORDER — TAMSULOSIN HCL 0.4 MG PO CAPS
0.4000 mg | ORAL_CAPSULE | Freq: Every day | ORAL | Status: DC
  Administered 2023-10-01 – 2023-10-04 (×4): 0.4 mg via ORAL
  Filled 2023-10-01 (×4): qty 1

## 2023-10-01 MED ORDER — POTASSIUM CHLORIDE 20 MEQ PO PACK
40.0000 meq | PACK | Freq: Once | ORAL | Status: AC
  Administered 2023-10-01: 40 meq via ORAL
  Filled 2023-10-01: qty 2

## 2023-10-01 MED ORDER — POLYVINYL ALCOHOL 1.4 % OP SOLN
1.0000 [drp] | OPHTHALMIC | Status: DC | PRN
  Administered 2023-10-01 – 2023-10-02 (×2): 1 [drp] via OPHTHALMIC
  Filled 2023-10-01: qty 15

## 2023-10-01 NOTE — Progress Notes (Signed)
 Progress Note   Patient: Nicholas Gross ZOX:[id number] DOB: Apr 26, 1928 DOA: 09/28/2023     2 DOS: the patient was seen and examined on 10/01/2023   Brief hospital course:  Nicholas Gross is a 89 y.o. Caucasian male with medical history significant for coronary artery disease, hypertension CVA with residual right-sided weakness and dyslipidemia, who presented to the emergency room with acute onset of confusion.  The patient lives alone in a trailer and ambulates with a walker.  According to his niece he was last seen normal the day prior to his admission at about 2 PM.  On the day of admission at 2 PM his neighbor found him on the ground under a table before calling EMS.  The patient is able to ambulate with a walker at baseline.  He was unable to give much history apart from stating his name.  It is unclear how long the patient had been on the ground for.   ED Course: When the patient came to the ER, BP was 208/82 with otherwise normal vital signs.  Labs revealed hypokalemia of 2.6 and a BUN of 25 and AST 43 with total bili of 5.8 and total bili 1.5.  Total CK was 719 and high sensitive troponin I was 52 >> 21.  CBC showed leukocytosis 13.6 with neutrophilia as well as mild anemia respiratory panel came back negative.  UA came back negative.      Assessment and Plan:  * Acute encephalopathy Unclear etiology. Improved. More awake and alert. Speech remains slow and difficult to understand ??  Patient has been seen by neurology for possible parkinsonism versus essential tremor due to right-sided action tremor. MRI of the brain without contrast showed no acute intracranial finding. Aging brain and asymmetric left hippocampal atrophy.     Dysphagia Noted to have dysphagia with solids which seems to be an ongoing problem Appreciate speech therapy input.  Patient noted to have moderate aspiration risk.   Patient was reassessed by speech therapy and they recommend mechanical soft diet with thin  liquids.  Administer pills whole and in pure.  Patient needs assistance with meals    Acute urinary retention without infection Hematuria Foley catheter was inserted in the ER with drainage of 2100 mL of urine Continue Flomax Overnight 04/09, patient was said to have tried pulling out his Foley catheter and now has traumatic hematuria Hold Lovenox Will likely discharge home with Foley catheter to follow-up with urology as an outpatient     Fall at home, initial encounter Associated with acute rhabdomyolysis which has improved with IVF hydration Patient sustained pelvic fracture with acute right superior and inferior pubic rami fractures. Appreciate orthopedic surgery input.  Recommends nonsurgical management.  Patient would need at least 6 to 8 weeks for fractures to heal and for him to become sufficiently comfortable to resume full weightbearing Pain management  PT consult.  Patient may weight-bear as tolerated on his right leg and uses a walker for balance and support. Pain control as needed    Hypokalemia Supplement potassium     Essential hypertension Continue lisinopril     Hypernatremia Most likely iatrogenic Improved following administration of free water       Subjective: More awake and alert this morning.  Family at the bedside  Physical Exam: Vitals:   09/30/23 2005 10/01/23 0439 10/01/23 0541 10/01/23 0737  BP: (!) 117/57 (!) 167/67 (!) 148/59 (!) 114/57  Pulse: 73 68 (!) 59 (!) 52  Resp: 17 19  18  Temp: 98.2 F (36.8 C) 97.8 F (36.6 C)  97.9 F (36.6 C)  TempSrc:    Oral  SpO2: 99% 100%  100%  Weight:      Height:       GENERAL:  89 y.o.-year-old Caucasian male patient lying in the bed with no acute distress.  Awake, alert and oriented to person.  Able to answer simple questions but speech is hard to understand EYES: Pupils equal, round, reactive to light and accommodation. No scleral icterus. Extraocular muscles intact.  HEENT: Head atraumatic,  normocephalic. Oropharynx and nasopharynx clear.  NECK:  Supple, no jugular venous distention. No thyroid enlargement, no tenderness.  LUNGS: Normal breath sounds bilaterally, no wheezing, rales,rhonchi or crepitation. No use of accessory muscles of respiration.  CARDIOVASCULAR: Regular rate and rhythm, S1, S2 normal. No murmurs, rubs, or gallops.  ABDOMEN: Soft, nondistended, nontender. Bowel sounds present. No organomegaly or mass.  EXTREMITIES: No pedal edema, cyanosis, or clubbing.  NEUROLOGIC: Cranial nerves II through XII are intact. Muscle strength 5/5 in all extremities. Sensation intact. Gait not checked.  PSYCHIATRIC: The patient is alert and oriented to person.   SKIN: No obvious rash, lesion, or ulcer.      Data Reviewed: Sodium 144, potassium 3.4, white count 12.4 Labs reviewed  Family Communication: Plan of care discussed with patient's niece and his sister at the bedside.  All questions and concerns have been addressed.  Disposition: Status is: Inpatient Remains inpatient appropriate because: For possible discharge to skilled nursing facility in a.m.  Planned Discharge Destination: Skilled nursing facility    Time spent: 33 minutes  Author: Lucile Shutters, MD 10/01/2023 12:17 PM  For on call review [email or web address].

## 2023-10-01 NOTE — Evaluation (Signed)
 Physical Therapy Evaluation Patient Details Name: Nicholas Gross MRN: [id number] DOB: [birth year] Today's Date: 10/01/2023  History of Present Illness  Nicholas Gross is a 89 y.o. Caucasian male with medical history significant for coronary artery disease, hypertension CVA with residual right-sided weakness and dyslipidemia, who presented to the emergency room with acute onset of confusion.  The patient lives alone in a trailer and ambulates with a walker.  According to his niece he was last seen normal the day prior to his admission at about 2 PM.  On the day of admission at 2 PM his neighbor found him on the ground under a table before calling EMS. Pt found to ahve R sided pubic rami fractures and is WBAT on RLE.   Clinical Impression  Pt admitted with above diagnosis. Pt currently with functional limitations due to the deficits listed below (see PT Problem List). Pt received upright in bed getting fed by NT. Sister present in room. Pt unable to speak clearly thus reliant on sister for subjective reporting. PTA pt mod-I with RW living alone and ADL's. Does get assist with IADL's. Per sister, pt has cognition changes but does not really elaborate or specify.  To date, pt unable to really follow commands without max multi modal cuing. Minimal pain response with bed mobility but reliant on maxA at torso and chuck pad. Fair sitting tolerance statically but needing once again hand over hand and multi modal cuing for hand placement on RW. MinA with bed elevated to stand and unable to SPT to recliner without RLE buckling in stance phase. Pt began acutely bleeding from urethra to the floor outside of his foley thus return to sitting for falls risk and bleeding and RN notified. RN assessing and cleaning pt then maxA+2 to return to supine and scooted up in bed with all needs in reach. Cognition overall greatly limiting session. Pt will benefit from skilled PT services < 3 hours/day to address acute deficits  in cognition, pain, strength, and functional mobility to reduce falls risk and maximize return to PLOF.      If plan is discharge home, recommend the following: Two people to help with walking and/or transfers;Two people to help with bathing/dressing/bathroom;Assist for transportation;Supervision due to cognitive status;Assistance with cooking/housework;Direct supervision/assist for medications management   Can travel by private vehicle   No    Equipment Recommendations Other (comment) (TBD)  Recommendations for Other Services       Functional Status Assessment       Precautions / Restrictions Precautions Precautions: Fall Restrictions Weight Bearing Restrictions Per Provider Order: Yes RLE Weight Bearing Per Provider Order: Weight bearing as tolerated Other Position/Activity Restrictions: per Dr. Binnie Rail note.      Mobility  Bed Mobility Overal bed mobility: Needs Assistance Bed Mobility: Supine to Sit     Supine to sit: Max assist       Patient Response: Cooperative, Flat affect  Transfers Overall transfer level: Needs assistance Equipment used: Rolling walker (2 wheels) Transfers: Sit to/from Stand Sit to Stand: Min assist, From elevated surface           General transfer comment: Hand over hand set up for standing at [facility] for STS.    Ambulation/Gait               General Gait Details: unable  Stairs            Wheelchair Mobility     Tilt Bed Tilt Bed Patient Response: Cooperative, Flat affect  Modified  Rankin (Stroke Patients Only)       Balance Overall balance assessment: Needs assistance Sitting-balance support: Bilateral upper extremity supported, Feet supported Sitting balance-[name]: Fair       Standing balance-[name]: Poor Standing balance comment: heavy UE support on RW                             Pertinent Vitals/Pain Pain Assessment Pain Assessment: Faces Faces Pain Scale: Hurts little  more Pain Location: Reports LBP and R hip pain. PRN grabbing penis with notable bleeding in foley bag. Pain Descriptors / Indicators: Grimacing, Discomfort Pain Intervention(s): Limited activity within patient's tolerance, Monitored during session, Repositioned    Home Living Family/patient expects to be discharged to:: Skilled nursing facility Living Arrangements: Alone                 Additional Comments: Sister at bedside reports pt lives alone at home in [facility] with 4-5 STE with rails. Mod-I with RW and ADL's. Assisted with driving tasks.    Prior Function Prior Level of Function : Patient poor historian/Family not available                     Extremity/Trunk Assessment   Upper Extremity Assessment Upper Extremity Assessment: Defer to OT evaluation    Lower Extremity Assessment Lower Extremity Assessment: Generalized weakness       Communication   Communication Communication: Impaired Factors Affecting Communication: Difficulty expressing self    Cognition Arousal: Alert Behavior During Therapy: Flat affect                           PT - Cognition Comments: Little engagement with PT. Only says 1-2 words clearly in session. FOllows commands with increased time and multimodal cuing Following commands: Impaired Following commands impaired: Follows one step commands with increased time     Cueing Cueing Techniques: Verbal cues, Gestural cues, Tactile cues     General Comments      Exercises     Assessment/Plan    PT Assessment    PT Problem List         PT Treatment Interventions      PT Goals (Current goals can be found in the Care Plan section)  Acute Rehab PT Goals PT Goal Formulation: Patient unable to participate in goal setting Time For Goal Achievement: 10/15/23 Potential to Achieve Goals: Poor    Frequency Min 3X/week     Co-evaluation               AM-PAC PT "6 Clicks" Mobility  Outcome Measure Help needed  turning from your back to your side while in a flat bed without using bedrails?: Total Help needed moving from lying on your back to sitting on the side of a flat bed without using bedrails?: Total Help needed moving to and from a bed to a chair (including a wheelchair)?: Total Help needed standing up from a chair using your arms (e.g., wheelchair or bedside chair)?: A Lot Help needed to walk in hospital room?: Total Help needed climbing 3-5 steps with a railing? : Total 6 Click Score: 7    End of Session Equipment Utilized During Treatment: Gait belt Activity Tolerance: Patient limited by pain;Other (comment);Treatment limited secondary to medical complications (Comment) (acute bleeding from urethra) Patient left: in bed;with call bell/phone within reach;with bed alarm set;with nursing/sitter in room Nurse Communication: Mobility  status PT Visit Diagnosis: Unsteadiness on feet (R26.81);Muscle weakness (generalized) (M62.81);Pain Pain - Right/Left: Right Pain - part of body: Hip    Time: 1310-1340 PT Time Calculation (min) (ACUTE ONLY): 30 min   Charges:   PT Evaluation $PT Eval Moderate Complexity: 1 Mod PT Treatments $Therapeutic Activity: 8-22 mins PT General Charges $$ ACUTE PT VISIT: 1 Visit       Delphia Grates. Fairly IV, PT, DPT Physical Therapist- [place]  [facility]  10/01/2023, 3:17 PM

## 2023-10-01 NOTE — Plan of Care (Signed)

## 2023-10-01 NOTE — Progress Notes (Signed)
 Palliative Care Progress Note, Assessment & Plan   Patient Name: Nicholas Gross       Date: 10/01/2023 DOB: 05-25-1928  Age: 89 y.o. MRN#: [id number] Attending Physician: Lucile Shutters, MD Primary Care Physician: Dorcas Carrow, DO Admit Date: 09/28/2023  Subjective: Patient is sitting up in bed, awake and alert.  He acknowledges my presence and is able to make his wishes known.  His speech is slow but he is able to make clear words.  His sister is at bedside during my visit.  HPI: 89 y.o. male  with past medical history of CAD, HTN, CVA (right side residual), dyslipidemia, former smoker, and mesenteric mass admitted on 09/28/2023 after being found down in his home by his neighbor.   Patient is being treated for acute rhabdomyolysis, acute encephalopathy, acute urinary retention, pelvic fracture, hematoma of extraperitoneal space, and hypokalemia.   PMT was consulted to support patient and family with goals of care discussions.   Summary of counseling/coordination of care: Extensive chart review completed prior to meeting patient including labs, vital signs, imaging, progress notes, orders, and available advanced directive documents from current and previous encounters.   After reviewing the patient's chart and assessing the patient at bedside, I spoke with patient in regards to symptom management and goals of care.   Symptoms assessed.  Patient denies pain or discomfort at this time.  He endorses being hungry.  Lunch tray is at bedside.  Discussed that patient needs assistance with feeding.  Nursing staff aware.  As per charge RN, nurse attempted to feed patient his breakfast and he pocketed the food.  Discussed with patient and sister that safe swallowing and clearance of food is needed for patient to  have poor p.o. intake.  Nursing staff will continue to assist patient with feeding when safe and appropriate.  No adjustment to [facility] needed at this time.  Education provided on importance of nutrition as a contributing factor to patient's overall prognosis.  Plan of care and next steps with patient and sister.  Plan remains for patient to be transferred to a SNF for further rehab after discharge.  TOC following closely for discharge planning.  No acute palliative needs today.  PMT will continue to follow and support patient and family throughout his hospitalization.  Physical Exam Vitals reviewed.  Constitutional:      General: He is not in acute distress.    Appearance: He is normal weight.  HENT:     Head: Normocephalic.     Mouth/Throat:     Mouth: Mucous membranes are moist.  Eyes:     Pupils: Pupils are equal, round, and reactive to light.  Pulmonary:     Effort: Pulmonary effort is normal.  Abdominal:     Palpations: Abdomen is soft.  Musculoskeletal:     Comments: Generalized weakness  Skin:    General: Skin is warm and dry.  Neurological:     Mental Status: He is alert.     Comments: Oriented to self and place  Psychiatric:        Mood and Affect: Mood normal.        Behavior: Behavior normal.        Judgment:  Judgment normal.             Total Time 35 minutes   Time spent includes: Detailed review of medical records (labs, imaging, vital signs), medically appropriate exam (mental status, respiratory, cardiac, skin), discussed with treatment team, counseling and educating patient, family and staff, documenting clinical information, medication management and coordination of care.  Samara Deist L. Bonita Quin, DNP, FNP-BC Palliative Medicine Team

## 2023-10-01 NOTE — Progress Notes (Signed)
 Flushed foley with 40cc of NS TID today with increased UO, and a decrease in concentration of blood in urine. Reva Bores 10/01/23 5:35 PM

## 2023-10-01 NOTE — Progress Notes (Signed)
 Patient ID: Nicholas Gross, male   DOB: 07-25-1927, 89 y.o.   MRN: [id number]  Subjective: The patient has no new complaints as a pertains to his pelvis or right hip.  He does appear somewhat more lucid today as compared to when he was first seen in the emergency room 2 days ago.  He has not yet gotten up with physical therapy.   Objective: Vital signs in last 24 hours: Temp:  [97.8 F (36.6 C)-98.3 F (36.8 C)] 97.9 F (36.6 C) (04/10 0737) Pulse Rate:  [52-96] 52 (04/10 0737) Resp:  [17-19] 18 (04/10 0737) BP: (99-167)/(57-67) 114/57 (04/10 0737) SpO2:  [99 %-100 %] 100 % (04/10 0737)  Intake/Output from previous day: 04/09 0701 - 04/10 0700 In: -  Out: 450 [Urine:450] Intake/Output this shift: Total I/O In: -  Out: 350 [Urine:350]  Recent Labs    09/28/23 1533 09/29/23 0523 09/30/23 0844 10/01/23 0837  HGB 12.1* 12.2* 11.6* 9.3*   Recent Labs    09/30/23 0844 10/01/23 0837  WBC 12.4* 12.4*  RBC 3.48* 2.80*  HCT 34.6* 27.9*  PLT 161 134*   Recent Labs    09/30/23 0844 10/01/23 0837  NA 148* 144  K 3.4* 3.4*  CL 114* 112*  CO2 27 26  BUN 20 32*  CREATININE 0.91 1.02  GLUCOSE 123* 96  CALCIUM 8.8* 8.5*   No results for input(s): "LABPT", "INR" in the last 72 hours.  Physical Exam: Orthopedic examination of the left hip and lower extremity is unchanged as compared to the initial consult note.  He remains grossly neurovascularly intact to the right lower extremity and foot.  Assessment: Essentially nondisplaced right superior and inferior pubic rami fractures.   Plan: The treatment options have been reviewed with the patient and his niece who is at the bedside.  Once cleared medically, the patient may be mobilized with physical therapy, weightbearing as tolerated on the right leg and using a walker for balance and support.  He will require rehab placement.  I would try to avoid any narcotic medication so as to minimize the risk of developing  confusion.  Thank you for asking me to participate in the care of this most pleasant yet unfortunate man.  I will sign off at this time.  Please make arrangements for the patient to follow-up in our office in 1 month for repeat x-rays of his pelvis.  If you have further need of orthopedic input during this hospitalization, please reconsult me.   Excell Seltzer Rayshun Kandler 10/01/2023, 10:55 AM

## 2023-10-01 NOTE — Plan of Care (Signed)
  Problem: Education: Goal: Knowledge of General Education information will improve Description: Including pain rating scale, medication(s)/side effects and non-pharmacologic comfort measures Outcome: Progressing   Problem: Health Behavior/Discharge Planning: Goal: Ability to manage health-related needs will improve Outcome: Progressing   Problem: Clinical Measurements: Goal: Ability to maintain clinical measurements within normal limits will improve Outcome: Progressing   Problem: Activity: Goal: Risk for activity intolerance will decrease Outcome: Progressing   Problem: Nutrition: Goal: Adequate nutrition will be maintained Outcome: Progressing   Problem: Coping: Goal: Level of anxiety will decrease Outcome: Progressing   Problem: Elimination: Goal: Will not experience complications related to bowel motility Outcome: Progressing   Problem: Pain Managment: Goal: General experience of comfort will improve and/or be controlled Outcome: Progressing   Problem: Skin Integrity: Goal: Risk for impaired skin integrity will decrease Outcome: Progressing

## 2023-10-02 DIAGNOSIS — S329XXA Fracture of unspecified parts of lumbosacral spine and pelvis, initial encounter for closed fracture: Secondary | ICD-10-CM | POA: Diagnosis not present

## 2023-10-02 DIAGNOSIS — R339 Retention of urine, unspecified: Secondary | ICD-10-CM | POA: Diagnosis not present

## 2023-10-02 DIAGNOSIS — G9340 Encephalopathy, unspecified: Secondary | ICD-10-CM | POA: Diagnosis not present

## 2023-10-02 DIAGNOSIS — E876 Hypokalemia: Secondary | ICD-10-CM | POA: Diagnosis not present

## 2023-10-02 LAB — CBC
HCT: 27.3 % — ABNORMAL LOW (ref 39.0–52.0)
Hemoglobin: 9.4 g/dL — ABNORMAL LOW (ref 13.0–17.0)
MCH: 34.1 pg — ABNORMAL HIGH (ref 26.0–34.0)
MCHC: 34.4 g/dL (ref 30.0–36.0)
MCV: 98.9 fL (ref 80.0–100.0)
Platelets: 148 10*3/uL — ABNORMAL LOW (ref 150–400)
RBC: 2.76 MIL/uL — ABNORMAL LOW (ref 4.22–5.81)
RDW: 13.4 % (ref 11.5–15.5)
WBC: 9.6 10*3/uL (ref 4.0–10.5)
nRBC: 0 % (ref 0.0–0.2)

## 2023-10-02 LAB — BASIC METABOLIC PANEL WITH GFR
Anion gap: 5 (ref 5–15)
BUN: 29 mg/dL — ABNORMAL HIGH (ref 8–23)
CO2: 27 mmol/L (ref 22–32)
Calcium: 8.4 mg/dL — ABNORMAL LOW (ref 8.9–10.3)
Chloride: 112 mmol/L — ABNORMAL HIGH (ref 98–111)
Creatinine, Ser: 0.99 mg/dL (ref 0.61–1.24)
GFR, Estimated: >60 mL/min (ref >60)
Glucose, Bld: 91 mg/dL (ref 70–99)
Potassium: 3.7 mmol/L (ref 3.5–5.1)
Sodium: 144 mmol/L (ref 135–145)

## 2023-10-02 LAB — CK: Total CK: 73 U/L (ref 49–397)

## 2023-10-02 NOTE — Care Management Important Message (Signed)
 Important Message  Patient Details  Name: Nicholas Gross MRN: [id number] Date of Birth: Dec 03, 1927   Important Message Given:  Yes - Medicare IM     Cristela Blue, CMA 10/02/2023, 11:47 AM

## 2023-10-02 NOTE — Progress Notes (Signed)
 Physical Therapy Treatment Patient Details Name: Nicholas Gross MRN: [id number] DOB: October 06, 1927 Today's Date: 10/02/2023   History of Present Illness Pt is a 89 y.o. Caucasian male with medical history significant for coronary artery disease, hypertension CVA with residual right-sided weakness and dyslipidemia, who presented to the emergency room with acute onset of confusion s/p fall with R sided pubic rami fractures and is WBAT on RLE.    PT Comments  Pt received upright in bed. Sister present. Agreeable to PT/OT co treat to progress safe mobility. Appears more alert today. Following commands with increased time but improved ability to follow commands compared to prior session not needing as much multi modal cuing. Pt needing modA+2 for bed mobility likely from chronic RUE weakness from prior CVA, minA+2 for STS at EOB with bed elevated, and modA+2 for SPT to chair for facilitation of RLE blocking knee in stance phase and RW to recliner. Pt progressing overall but still reliant on 2 person assist for safe OOB mobility remaining a high falls risk. Thus d/c recs remain appropriate. Pt left in care of OT.    If plan is discharge home, recommend the following: Two people to help with walking and/or transfers;Two people to help with bathing/dressing/bathroom;Assist for transportation;Supervision due to cognitive status;Assistance with cooking/housework;Direct supervision/assist for medications management   Can travel by private vehicle     No  Equipment Recommendations  Other (comment) (TBD)    Recommendations for Other Services       Precautions / Restrictions Precautions Precautions: Fall Recall of Precautions/Restrictions: Impaired Restrictions Weight Bearing Restrictions Per Provider Order: Yes RLE Weight Bearing Per Provider Order: Weight bearing as tolerated Other Position/Activity Restrictions: per Dr. Binnie Rail note.     Mobility  Bed Mobility Overal bed mobility: Needs  Assistance Bed Mobility: Supine to Sit     Supine to sit: +2 for physical assistance, Mod assist     General bed mobility comments: pt initiatied BLE movement, hand over hand assist for placement of RUE on bed rail and removal; ultimately Mod A x2 for reaching EOB uising chux pads Patient Response: Cooperative, Flat affect  Transfers Overall transfer level: Needs assistance Equipment used: Rolling walker (2 wheels) Transfers: Sit to/from Stand, Bed to chair/wheelchair/BSC Sit to Stand: Min assist, From elevated surface, +2 physical assistance, Mod assist           General transfer comment: Min A x2 for STS from elevated bed height, increased time and cueing for R hand placement on RW; Mod A x2 to safely transition to recliner via SPT with multi modal cueing    Ambulation/Gait                   Stairs             Wheelchair Mobility     Tilt Bed Tilt Bed Patient Response: Cooperative, Flat affect  Modified Rankin (Stroke Patients Only)       Balance Overall balance assessment: Needs assistance Sitting-balance support: Bilateral upper extremity supported, Feet supported Sitting balance-[name]: Fair     Standing balance support: Bilateral upper extremity supported, Reliant on assistive device for balance Standing balance-[name]: Poor Standing balance comment: heavy UE support on RW, +2                            Communication Communication Communication: Impaired Factors Affecting Communication: Difficulty expressing self  Cognition Arousal: Alert Behavior During Therapy: Flat affect  PT - Cognition Comments: Improved response to following commands. Baseline speech issues limiting full understanding of cognition. Following commands: Impaired Following commands impaired: Follows one step commands with increased time    Cueing Cueing Techniques: Verbal cues, Gestural cues, Tactile cues   Exercises      General Comments General comments (skin integrity, edema, etc.): minimal bleeding from foley still noted      Pertinent Vitals/Pain Pain Assessment Pain Assessment: Faces Faces Pain Scale: No hurt    Home Living Family/patient expects to be discharged to:: Skilled nursing facility Living Arrangements: Alone                 Additional Comments: Sister at bedside reports pt lives alone at home in [facility] with 4-5 STE with rails. Mod-I with RW and ADL's. Assisted with driving tasks.    Prior Function            PT Goals (current goals can now be found in the care plan section) Acute Rehab PT Goals PT Goal Formulation: Patient unable to participate in goal setting Time For Goal Achievement: 10/15/23 Potential to Achieve Goals: Fair Progress towards PT goals: Progressing toward goals    Frequency    Min 3X/week      PT Plan      Co-evaluation   Reason for Co-Treatment: For patient/therapist safety;To address functional/ADL transfers PT goals addressed during session: Mobility/safety with mobility OT goals addressed during session: ADL's and self-care      AM-PAC PT "6 Clicks" Mobility   Outcome Measure  Help needed turning from your back to your side while in a flat bed without using bedrails?: A Lot Help needed moving from lying on your back to sitting on the side of a flat bed without using bedrails?: A Lot Help needed moving to and from a bed to a chair (including a wheelchair)?: A Lot Help needed standing up from a chair using your arms (e.g., wheelchair or bedside chair)?: A Lot Help needed to walk in hospital room?: A Lot Help needed climbing 3-5 steps with a railing? : Total 6 Click Score: 11    End of Session Equipment Utilized During Treatment: Gait belt Activity Tolerance: Patient tolerated treatment well Patient left: in chair;with call bell/phone within reach;with chair alarm set;with family/visitor present Nurse Communication:  Mobility status PT Visit Diagnosis: Unsteadiness on feet (R26.81);Muscle weakness (generalized) (M62.81);Pain     Time: 5621-3086 PT Time Calculation (min) (ACUTE ONLY): 18 min  Charges:    $Therapeutic Activity: 8-22 mins PT General Charges $$ ACUTE PT VISIT: 1 Visit                     Delphia Grates. Fairly IV, PT, DPT Physical Therapist- [place]  [facility]  10/02/2023, 2:17 PM

## 2023-10-02 NOTE — Progress Notes (Signed)
 Speech Language Pathology Treatment: Dysphagia  Patient Details Name: Nicholas Gross MRN: [id number] DOB: January 04, 1928 Today's Date: 10/02/2023 Time: 6962-9528 SLP Time Calculation (min) (ACUTE ONLY): 35 min  Assessment / Plan / Recommendation Clinical Impression  Pt seen for ongoing assessment of swallowing; assessment of toleration of mech soft diet upgraded this week. Per chart notes and MD, pt has been more alert; he engaged verbally, though slow responses. Generalized weakness overall, including mumbled speech. He was able to participate in self-feeding by holding Cup to drink. He followed basic commands w/ Cue.  On RA, afebrile. WBC WNL.   Pt appears to present w/ grossly functional oropharyngeal phase swallowing w/ No immediate, overt clinical s/s of aspiration noted during po trials. Pt presents w/ risk for aspiration/aspiration pneumonia in setting current comorbidities but this risk can be reduced by following general aspiration precautions and giving him feeding support during meals; 100% Supervision. Unsure of pt's COGNITIVE FUNCTION and/or if any decline(?). ANY COGNITIVE DECLINE can impact awareness/timing of during swallowing and increase risk for aspiration/aspiration pneumonia.  Pt has other challenging factors that could impact oropharyngeal swallowing to include significant deconditioning/weakness, concern for Neurological issues(per chart), Esophageal phase Dysmotility-(Seen by SLP at [facility] 04/17/23- reporting globus sensation with solids- recommending GI follow up), vision deficits, LOOSE Dentures, and advanced Age as well as current hospitalization. These factors can increase risk for aspiration, dysphagia as well as decreased oral intake overall.    During po trials, pt consumed mech soft consistencies(Oreo cookies) and M&Ms along w/ thin liquids via cup/straw w/ no immediate, overt coughing, decline in vocal quality, or change in respiratory presentation during/post trials. O2  sats remained 97% when checked. Oral phase appeared grossly [facility] w/ timely bolus management, mastication, and control of bolus propulsion for A-P transfer for swallowing. Min slower mastication and need for Time w/ the increased textured cookie trials, especially when taking Larger bites. But overall wfl when moistened well in setting of LOOSE Dentures and generalized weakness/age. Oral clearing achieved w/ all trial consistencies -- gave Time and alternated food/liquid. Pt helped to feed self by [employer], which improves safety of swallowing. Noted he was MIN+ IMPULSIVE when self-feeding the cookies -- Cues given. Pt would benefit from Supervision to reduce risk of choking.  OF NOTE: pt has LOOSE Dentures -- may need adhesive to secure them?   Recommend continue a Mech soft consistency diet w/ Minced meats, well-moistened foods; Thin liquids -- carefully monitor, and pt should help to Hold Cup when drinking. NO straws if coughing noted. Recommend general aspiration precautions, 100% support and Supervision at meals. Small sips and bites, Slowly. Pills WHOLE in Puree for safer, easier swallowing -- it was encouraged now and for D/C to the pt and Sister.    Education given on Pills in Puree; food consistencies and easy to eat options; general aspiration precautions to pt and Sister. No further Acute ST services indicated at this time. F/u at his next venue of care if needs indicate. NSG updated, agreed. MD updated. Recommend Dietician and Palliative Care f/u for support.      HPI HPI: "Nicholas Gross is a 89 y.o. Caucasian male (unsure of Baseline Cognitive functioning as he lives alone?) with medical history significant for coronary artery disease, hypertension CVA with residual right-sided weakness and dyslipidemia, who presented to the emergency room with acute onset of confusion.  The patient lives alone in a trailer and ambulates with a walker.  According to his niece he was last seen normal  the  day prior to his admission at about 2 PM.  On the day of admission at 2 PM his neighbor found him on the ground under a table before calling EMS.  The patient is able to ambulate with a walker at baseline.  He was unable to give much history apart from stating his name.  It is unclear how long the patient had been on the ground for." MRI Brain 4/8: No acute intracranial finding.  2. Aging brain and asymmetric left hippocampal atrophy.      SLP Plan  All goals met      Recommendations for follow up therapy are one component of a multi-disciplinary discharge planning process, led by the attending physician.  Recommendations may be updated based on patient status, additional functional criteria and insurance authorization.    Recommendations  Diet recommendations: Dysphagia 3 (mechanical soft);Thin liquid (gravies to moisten) Liquids provided via: Cup Medication Administration: Whole meds with puree Supervision: Patient able to self feed;Staff to assist with self feeding;Full supervision/cueing for compensatory strategies Compensations: Minimize environmental distractions;Slow rate;Small sips/bites;Lingual sweep for clearance of pocketing;Multiple dry swallows after each bite/sip;Follow solids with liquid Postural Changes and/or Swallow Maneuvers: Out of bed for meals;Seated upright 90 degrees;Upright 30-60 min after meal (Reflux precs)                 (Palliative Care; Dietician) Oral care BID;Oral care before and after PO;Staff/trained caregiver to provide oral care (Denture care)   Frequent or constant Supervision/Assistance (at meals) Dysphagia, oropharyngeal phase (R13.12) (advanced Age; deconditioned; medical comorbidities; LOOSE Dentures)     All goals met       Jerilynn Som, MS, CCC-SLP Speech Language Pathologist Rehab Services; [facility] - [place] [phone] (ascom) Mariana Wiederholt  10/02/2023, 2:07 PM

## 2023-10-02 NOTE — Evaluation (Signed)
 Occupational Therapy Evaluation Patient Details Name: Nicholas Gross MRN: [id number] DOB: [birth year] Today's Date: 10/02/2023   History of Present Illness   Pt is a 89 y.o. Caucasian male with medical history significant for coronary artery disease, hypertension CVA with residual right-sided weakness and dyslipidemia, who presented to the emergency room with acute onset of confusion s/p fall with R sided pubic rami fractures and is WBAT on RLE.     Clinical Impressions Pt was seen for OT evaluation this date. Prior to hospital admission, pt is unable to provide history, but sister present and reports he was living alone and ambulating with a rollator MOD I and IND with ADL performance.    Pt presents to acute OT demonstrating impaired ADL performance and functional mobility 2/2 weakness, balance deficits, and cognitive deficits. PT in for session d/t +2 assist needed. Pt currently requires Mod A x2 for bed mobility. He needed Min A x2 for STS from elevated bed height, increased time and cueing for R hand placement on RW. Mod A x2 to safely transition to recliner via SPT with multi modal cueing and R knee blocking required. Reports he is R hand dominant and RUE ROM WFL and 3+/5 strength, however pt uses L hand throughout session for face washing and drinking from a cup. Residual R sided deficits at baseline.  Pt would benefit from skilled OT services to address noted impairments and functional limitations to maximize safety and independence while minimizing falls risk and caregiver burden. Do anticipate the need for follow up OT services upon acute hospital DC.      If plan is discharge home, recommend the following:   A lot of help with walking and/or transfers;A lot of help with bathing/dressing/bathroom;Assistance with cooking/housework;Direct supervision/assist for financial management;Supervision due to cognitive status;Help with stairs or ramp for entrance;Direct supervision/assist for  medications management;Two people to help with walking and/or transfers     Functional Status Assessment   Patient has had a recent decline in their functional status and demonstrates the ability to make significant improvements in function in a reasonable and predictable amount of time.     Equipment Recommendations   Other (comment) (defer)     Recommendations for Other Services         Precautions/Restrictions   Precautions Precautions: Fall Recall of Precautions/Restrictions: Impaired Restrictions Weight Bearing Restrictions Per Provider Order: Yes RLE Weight Bearing Per Provider Order: Weight bearing as tolerated Other Position/Activity Restrictions: per Dr. Binnie Rail note.     Mobility Bed Mobility Overal bed mobility: Needs Assistance Bed Mobility: Supine to Sit           General bed mobility comments: pt initiatied BLE movement, hand over hand assist for placement of RUE on bed rail and removal; ultimately Mod A x2 for reaching EOB uising chux pads    Transfers Overall transfer level: Needs assistance Equipment used: Rolling walker (2 wheels) Transfers: Sit to/from Stand Sit to Stand: Min assist, From elevated surface, +2 physical assistance           General transfer comment: Min A x2 for STS from elevated bed height, increased time and cueing for R hand placement on RW; Mod A x2 to safely transition to recliner via SPT with multi modal cueing      Balance Overall balance assessment: Needs assistance   Sitting balance-[name]: Fair     Standing balance support: Bilateral upper extremity supported, Reliant on assistive device for balance Standing balance-[name]: Poor Standing balance comment: heavy  UE support on RW, +2                           ADL either performed or assessed with clinical judgement   ADL Overall ADL's : Needs assistance/impaired     Grooming: Wash/dry face;Sitting;Set up                    Toilet Transfer: Moderate assistance;Rolling walker (2 wheels);+2 for physical assistance Toilet Transfer Details (indicate cue type and reason): simulated to recliner; multi modal cueing and R knee blocking requiring as well as RW management                 Vision         Perception         Praxis         Pertinent Vitals/Pain Pain Assessment Pain Assessment: Faces Faces Pain Scale: No hurt Pain Intervention(s): Monitored during session, Repositioned, Limited activity within patient's tolerance     Extremity/Trunk Assessment Upper Extremity Assessment Upper Extremity Assessment: Generalized weakness;RUE deficits/detail RUE Deficits / Details: reports he is R handed, but mostly uses L hand for tasks; RUE ROM WFL, strength 3+/5; moves slower   Lower Extremity Assessment Lower Extremity Assessment: Generalized weakness       Communication Communication Communication: Impaired Factors Affecting Communication: Difficulty expressing self   Cognition Arousal: Alert Behavior During Therapy: Flat affect                                 Following commands: Impaired Following commands impaired: Follows one step commands with increased time     Cueing  General Comments   Cueing Techniques: Verbal cues;Gestural cues;Tactile cues  minimal bleeding from foley still noted   Exercises Other Exercises Other Exercises: Edu on role of OT in acute setting.   Shoulder Instructions      Home Living Family/patient expects to be discharged to:: Skilled nursing facility Living Arrangements: Alone                               Additional Comments: Sister at bedside reports pt lives alone at home in [facility] with 4-5 STE with rails. Mod-I with RW and ADL's. Assisted with driving tasks.      Prior Functioning/Environment Prior Level of Function : Patient poor historian/Family not available             Mobility Comments: per sister MOD I with  RW ADLs Comments: MOD I per sister    OT Problem List: Decreased strength;Decreased activity tolerance;Impaired balance (sitting and/or standing);Decreased safety awareness;Decreased cognition   OT Treatment/Interventions: Self-care/ADL training;Balance training;Therapeutic exercise;Therapeutic activities;DME and/or AE instruction;Patient/family education      OT Goals(Current goals can be found in the care plan section)   Acute Rehab OT Goals Patient Stated Goal: improve strength OT Goal Formulation: With patient Time For Goal Achievement: 10/16/23 Potential to Achieve Goals: Fair ADL Goals Pt Will Perform Upper Body Bathing: sitting;with contact guard assist Pt Will Perform Upper Body Dressing: with contact guard assist;sitting Pt Will Transfer to Toilet: with min assist;bedside commode;stand pivot transfer   OT Frequency:  Min 2X/week    Co-evaluation PT/OT/SLP Co-Evaluation/Treatment: Yes Reason for Co-Treatment: For patient/therapist safety;To address functional/ADL transfers PT goals addressed during session: Mobility/safety with mobility OT goals addressed during session: ADL's and self-care  AM-PAC OT "6 Clicks" Daily Activity     Outcome Measure Help from another person eating meals?: A Little Help from another person taking care of personal grooming?: A Little Help from another person toileting, which includes using toliet, bedpan, or urinal?: A Lot Help from another person bathing (including washing, rinsing, drying)?: A Lot Help from another person to put on and taking off regular upper body clothing?: A Little Help from another person to put on and taking off regular lower body clothing?: A Lot 6 Click Score: 15   End of Session Equipment Utilized During Treatment: Rolling walker (2 wheels);Gait belt Nurse Communication: Mobility status  Activity Tolerance: Patient tolerated treatment well Patient left: in chair;with call bell/phone within reach;with  chair alarm set;with family/visitor present  OT Visit Diagnosis: Other abnormalities of gait and mobility (R26.89);Unsteadiness on feet (R26.81);Muscle weakness (generalized) (M62.81)                Time: 1610-9604 OT Time Calculation (min): 28 min Charges:  OT General Charges $OT Visit: 1 Visit OT Evaluation $OT Eval Moderate Complexity: 1 Mod Aurorah Schlachter, OTR/L 10/02/23, 1:31 PM  Delitha Elms E Piera Downs 10/02/2023, 1:26 PM

## 2023-10-02 NOTE — Progress Notes (Signed)
 Progress Note   Patient: Nicholas Gross HQI:[id number] DOB: 09/02/1927 DOA: 09/28/2023     3 DOS: the patient was seen and examined on 10/02/2023   Brief hospital course: Nicholas Gross is a 89 y.o. Caucasian male with medical history significant for coronary artery disease, hypertension CVA with residual right-sided weakness and dyslipidemia, who presented to the emergency room with acute onset of confusion.  The patient lives alone in a trailer and ambulates with a walker.  According to his niece he was last seen normal the day prior to his admission at about 2 PM.  On the day of admission at 2 PM his neighbor found him on the ground under a table before calling EMS.  The patient is able to ambulate with a walker at baseline.  He was unable to give much history apart from stating his name.  It is unclear how long the patient had been on the ground for.   ED Course: When the patient came to the ER, BP was 208/82 with otherwise normal vital signs.  Labs revealed hypokalemia of 2.6 and a BUN of 25 and AST 43 with total bili of 5.8 and total bili 1.5.  Total CK was 719 and high sensitive troponin I was 52 >> 21.  CBC showed leukocytosis 13.6 with neutrophilia as well as mild anemia respiratory panel came back negative.  UA came back negative.      Assessment and Plan:  Acute encephalopathy Presumed metabolic Unclear etiology. Improved. More awake and alert. Speech remains slow and difficult to understand but patient is at his baseline ??  Patient has been seen by neurology for possible parkinsonism versus essential tremor due to right-sided action tremor. MRI of the brain without contrast showed no acute intracranial finding. Aging brain and asymmetric left hippocampal atrophy.     Dysphagia Noted to have dysphagia with solids which seems to be an ongoing problem Appreciate speech therapy input.  Patient noted to have moderate aspiration risk.   Patient was reassessed by speech therapy and  they recommend mechanical soft diet with thin liquids.  Administer pills whole and in pure.  Patient needs assistance with meals     Acute urinary retention without infection Hematuria Foley catheter was inserted in the ER with drainage of 2100 mL of urine Continue Flomax Overnight 04/09, patient was said to have tried pulling out his Foley catheter and now has traumatic hematuria Continue to hold Lovenox.  SCD for DVT prophylaxis Will likely discharge home with Foley catheter to follow-up with urology as an outpatient Nursing to flush Foley catheter every 4 hours   Fall at home, initial encounter Associated with acute rhabdomyolysis which has improved with IVF hydration Patient sustained pelvic fracture with acute right superior and inferior pubic rami fractures. Appreciate orthopedic surgery input.  Recommends nonsurgical management.  Patient would need at least 6 to 8 weeks for fractures to heal and for him to become sufficiently comfortable to resume full weightbearing Pain management  PT consult.  Patient may weight-bear as tolerated on his right leg and uses a walker for balance and support. Pain control as needed     Hypokalemia Supplement potassium     Essential hypertension Continue lisinopril     Hypernatremia Most likely iatrogenic Improved following administration of free water          Subjective: Patient is seen and examined at the bedside.  Continues to have hematuria but improved  Physical Exam: Vitals:   10/01/23 1556 10/01/23 2007  10/02/23 0345 10/02/23 0817  BP: 126/85 130/64 (!) 177/77 (!) 131/54  Pulse: 63 63 66 (!) 59  Resp: 18 18 18 18   Temp: 98.5 F (36.9 C) 98.4 F (36.9 C) 98 F (36.7 C) 97.6 F (36.4 C)  TempSrc: Oral Oral  Oral  SpO2: 97% 97% 100% 98%  Weight:      Height:       GENERAL:  89 y.o.-year-old Caucasian male patient lying in the bed with no acute distress.  Awake, alert and oriented to person.  Able to answer simple  questions but speech is hard to understand EYES: Pupils equal, round, reactive to light and accommodation. No scleral icterus. Extraocular muscles intact.  HEENT: Head atraumatic, normocephalic. Oropharynx and nasopharynx clear.  NECK:  Supple, no jugular venous distention. No thyroid enlargement, no tenderness.  LUNGS: Normal breath sounds bilaterally, no wheezing, rales,rhonchi or crepitation. No use of accessory muscles of respiration.  CARDIOVASCULAR: Regular rate and rhythm, S1, S2 normal. No murmurs, rubs, or gallops.  ABDOMEN: Soft, nondistended, nontender. Bowel sounds present. No organomegaly or mass.  EXTREMITIES: No pedal edema, cyanosis, or clubbing.  NEUROLOGIC: Cranial nerves II through XII are intact. Muscle strength 5/5 in all extremities. Sensation intact. Gait not checked.  PSYCHIATRIC: The patient is alert and oriented to person.   SKIN: No obvious rash, lesion, or ulcer.      Data Reviewed: BUN 29, potassium 3.7, hemoglobin 9.4, white count 9.6 Labs reviewed  Family Communication: Plan of care discussed with patient's sister at the bedside.  All questions and concerns have been addressed  Disposition: Status is: Inpatient Remains inpatient appropriate because: Awaiting discharge to SNF once prior Auth is approved  Planned Discharge Destination: Skilled nursing facility    Time spent: 34 minutes  Author: Lucile Shutters, MD 10/02/2023 12:39 PM  For on call review [email or web address].

## 2023-10-02 NOTE — Plan of Care (Signed)

## 2023-10-02 NOTE — Progress Notes (Signed)
 Palliative Care Progress Note, Assessment & Plan   Patient Name: Nicholas Gross       Date: 10/02/2023 DOB: March 23, 1928  Age: 89 y.o. MRN#: [id number] Attending Physician: Lucile Shutters, MD Primary Care Physician: Dorcas Carrow, DO Admit Date: 09/28/2023  Subjective: Patient is out of bed and sitting in recliner.  He is awake, alert, and acknowledges my presence.  He is able to make his wishes known.  His sister is visiting at bedside during my visit.  HPI: 89 y.o. male  with past medical history of CAD, HTN, CVA (right side residual), dyslipidemia, former smoker, and mesenteric mass admitted on 09/28/2023 after being found down in his home by his neighbor.   Patient is being treated for acute rhabdomyolysis, acute encephalopathy, acute urinary retention, pelvic fracture, hematoma of extraperitoneal space, and hypokalemia.   PMT was consulted to support patient and family with goals of care discussions.   Summary of counseling/coordination of care: Extensive chart review completed prior to meeting patient including labs, vital signs, imaging, progress notes, orders, and available advanced directive documents from current and previous encounters.   After reviewing the patient's chart and assessing the patient at bedside, I spoke with patient in regards to symptom management and goals of care.   Symptoms assessed.  Patient denies headache, chest pain, N/V/D, or other acute concerns at this time.  He shares that he would tell me if he was in pain but he is not in pain at the moment.  He is requesting water.  Glass of water is at bedside.  Discussed patient significant improvement from a few days ago.  He smiles with approval.  His sister shares she is happy that he is out of bed and is asking when he  will be able to leave.  Discussed that TOC is following closely for discharge planning once PT and knows that he evals as well as attending have medically cleared patient.  She was appreciative of this explanation.  Goals are clear.  Plan is to discharge to SNF when appropriate.  TOC following closely.  No acute palliative needs at this time.  After meeting with the patient and his sister, attempted to speak with patient's niece Tammy over the phone.  No answer.  HIPAA compliant voicemail left.  I advised patient, his sister, and his niece via voicemail that I am off service after today.  PMT will step back from daily visits but remain available to patient and family throughout his hospitalization.  Please reengage with PMT if goals change, at patient/family's request, or if patient's health deteriorates during hospitalization.  Physical Exam Vitals reviewed.  Constitutional:      General: He is not in acute distress.    Appearance: He is normal weight.  HENT:     Head: Normocephalic.     Mouth/Throat:     Mouth: Mucous membranes are moist.  Eyes:     Pupils: Pupils are equal, round, and reactive to light.  Cardiovascular:     Rate and Rhythm: Normal rate.  Pulmonary:     Effort: Pulmonary effort is normal.  Abdominal:     Palpations: Abdomen is soft.  Musculoskeletal:     Comments: MAETC,  generalized weakness  Skin:    General: Skin is warm and dry.  Neurological:     Mental Status: He is alert and oriented to person, place, and time.  Psychiatric:        Mood and Affect: Mood normal.        Behavior: Behavior normal.        Thought Content: Thought content normal.        Judgment: Judgment normal.             Total Time 35 minutes   Time spent includes: Detailed review of medical records (labs, imaging, vital signs), medically appropriate exam (mental status, respiratory, cardiac, skin), discussed with treatment team, counseling and educating patient, family and staff,  documenting clinical information, medication management and coordination of care.  Samara Deist L. Bonita Quin, DNP, FNP-BC Palliative Medicine Team

## 2023-10-03 DIAGNOSIS — G9340 Encephalopathy, unspecified: Secondary | ICD-10-CM | POA: Diagnosis not present

## 2023-10-03 LAB — GLUCOSE, CAPILLARY: Glucose-Capillary: 108 mg/dL — ABNORMAL HIGH (ref 70–99)

## 2023-10-03 LAB — CK: Total CK: 50 U/L (ref 49–397)

## 2023-10-03 MED ORDER — POLYETHYLENE GLYCOL 3350 17 G PO PACK
17.0000 g | PACK | Freq: Every day | ORAL | Status: DC
  Administered 2023-10-03 – 2023-10-04 (×2): 17 g via ORAL
  Filled 2023-10-03 (×3): qty 1

## 2023-10-03 NOTE — Progress Notes (Signed)
 Progress Note   Patient: Nicholas Gross NWG:[id number] DOB: 1928-03-16 DOA: 09/28/2023     4 DOS: the patient was seen and examined on 10/03/2023   Brief hospital course: Nicholas Gross is a 89 y.o. Caucasian male with medical history significant for coronary artery disease, hypertension CVA with residual right-sided weakness and dyslipidemia, who presented to the emergency room with acute onset of confusion.  The patient lives alone in a trailer and ambulates with a walker.  According to his niece he was last seen normal the day prior to his admission at about 2 PM.  On the day of admission at 2 PM his neighbor found him on the ground under a table before calling EMS.  The patient is able to ambulate with a walker at baseline.  He was unable to give much history apart from stating his name.  It is unclear how long the patient had been on the ground for.   ED Course: When the patient came to the ER, BP was 208/82 with otherwise normal vital signs.  Labs revealed hypokalemia of 2.6 and a BUN of 25 and AST 43 with total bili of 5.8 and total bili 1.5.  Total CK was 719 and high sensitive troponin I was 52 >> 21.  CBC showed leukocytosis 13.6 with neutrophilia as well as mild anemia respiratory panel came back negative.  UA came back negative.     04/12 Hospital course has been complicated by episode of traumatic hematuria which has resolved.    Assessment and Plan:  Acute encephalopathy Presumed metabolic Unclear etiology. Improved. More awake and alert. Speech remains slow and difficult to understand but patient is at his baseline ??  Patient has been seen by neurology in the out patient setting for possible parkinsonism versus essential tremor due to right-sided action tremor. MRI of the brain without contrast showed no acute intracranial finding. Aging brain and asymmetric left hippocampal atrophy.     Dysphagia Noted to have dysphagia with solids which seems to be an ongoing  problem Appreciate speech therapy input.  Patient noted to have moderate aspiration risk.   Patient was reassessed by speech therapy and they recommend mechanical soft diet/ with thin liquids.  Administer pills whole and in pure.  Patient needs assistance with meals     Acute urinary retention without infection Hematuria (Resolved) Foley catheter was inserted in the ER with drainage of 2100 mL of urine Continue Flomax for presumed BPH Overnight 04/09, patient was said to have tried pulling out his Foley catheter and now has traumatic hematuria which has resolved Continue to hold Lovenox.  SCD for DVT prophylaxis Will likely discharge home with Foley catheter to follow-up with urology as an outpatient    Fall at home, initial encounter Associated with acute rhabdomyolysis which has improved with IVF hydration Patient sustained pelvic fracture with acute right superior and inferior pubic rami fractures. Appreciate orthopedic surgery input.  Recommends nonsurgical management.  Patient would need at least 6 to 8 weeks for fractures to heal and for him to become sufficiently comfortable to resume full weightbearing Pain management  PT consult.  Patient may weight-bear as tolerated on his right leg and uses a walker for balance and support. Pain control as needed     Hypokalemia Supplement potassium     Essential hypertension Continue lisinopril     Hypernatremia Most likely iatrogenic Improved following administration of free water        Subjective: Sitting up in bed.  RN  was concerned about a change in condition " slurred speech".  Explained to her in detail that patient has had slow speech which is difficult to understand since his presentation.  Physical Exam: Vitals:   10/02/23 1729 10/02/23 2142 10/03/23 0337 10/03/23 0700  BP: (!) 132/56 (!) 104/59 (!) 133/58 116/65  Pulse: 72 72 77 64  Resp: 18 18 18 17   Temp: 98.1 F (36.7 C) 97.9 F (36.6 C) (!) 97 F (36.1 C)  97.9 F (36.6 C)  TempSrc:    Oral  SpO2: 99% 97% 100% 98%  Weight:      Height:       GENERAL:  89 y.o.-year-old Caucasian male patient lying in the bed with no acute distress.  Awake, alert and oriented to person.  Able to answer simple questions but speech is hard to understand EYES: Pupils equal, round, reactive to light and accommodation. No scleral icterus. Extraocular muscles intact.  HEENT: Head atraumatic, normocephalic. Oropharynx and nasopharynx clear.  NECK:  Supple, no jugular venous distention. No thyroid enlargement, no tenderness.  LUNGS: Normal breath sounds bilaterally, no wheezing, rales,rhonchi or crepitation. No use of accessory muscles of respiration.  CARDIOVASCULAR: Regular rate and rhythm, S1, S2 normal. No murmurs, rubs, or gallops.  ABDOMEN: Soft, nondistended, nontender. Bowel sounds present. No organomegaly or mass.  EXTREMITIES: No pedal edema, cyanosis, or clubbing.  NEUROLOGIC: Cranial nerves II through XII are intact. Muscle strength 5/5 in all extremities. Sensation intact. Gait not checked.  PSYCHIATRIC: The patient is alert and oriented to person.   SKIN: No obvious rash, lesion, or ulcer.    Data Reviewed:   There are no new results to review at this time.  Family Communication: Plan of care discussed with nursing and patient's sister at the bedside.  All questions and concerns have been addressed  Disposition: Status is: Inpatient Remains inpatient appropriate because: Awaiting discharge to SNF  Planned Discharge Destination: Skilled nursing facility    Time spent: 33 minutes  Author: Read Camel, MD 10/03/2023 11:22 AM  For on call review [email or web address].

## 2023-10-03 NOTE — Plan of Care (Signed)
   Problem: Safety: Goal: Ability to remain free from injury will improve Outcome: Progressing

## 2023-10-04 DIAGNOSIS — G9340 Encephalopathy, unspecified: Secondary | ICD-10-CM | POA: Diagnosis not present

## 2023-10-04 LAB — BASIC METABOLIC PANEL WITH GFR
Anion gap: 3 — ABNORMAL LOW (ref 5–15)
BUN: 31 mg/dL — ABNORMAL HIGH (ref 8–23)
CO2: 27 mmol/L (ref 22–32)
Calcium: 8.4 mg/dL — ABNORMAL LOW (ref 8.9–10.3)
Chloride: 105 mmol/L (ref 98–111)
Creatinine, Ser: 0.87 mg/dL (ref 0.61–1.24)
GFR, Estimated: >60 mL/min (ref >60)
Glucose, Bld: 97 mg/dL (ref 70–99)
Potassium: 4.5 mmol/L (ref 3.5–5.1)
Sodium: 135 mmol/L (ref 135–145)

## 2023-10-04 LAB — CBC
HCT: 28.1 % — ABNORMAL LOW (ref 39.0–52.0)
Hemoglobin: 9.4 g/dL — ABNORMAL LOW (ref 13.0–17.0)
MCH: 33.3 pg (ref 26.0–34.0)
MCHC: 33.5 g/dL (ref 30.0–36.0)
MCV: 99.6 fL (ref 80.0–100.0)
Platelets: 154 10*3/uL (ref 150–400)
RBC: 2.82 MIL/uL — ABNORMAL LOW (ref 4.22–5.81)
RDW: 13.2 % (ref 11.5–15.5)
WBC: 9 10*3/uL (ref 4.0–10.5)
nRBC: 0 % (ref 0.0–0.2)

## 2023-10-04 LAB — CK: Total CK: 64 U/L (ref 49–397)

## 2023-10-04 MED ORDER — ORAL CARE MOUTH RINSE: 15.0000 mL | OROMUCOSAL | Status: DC | PRN

## 2023-10-04 MED ORDER — ORAL CARE MOUTH RINSE
15.0000 mL | OROMUCOSAL | Status: DC
  Administered 2023-10-05: 15 mL via OROMUCOSAL

## 2023-10-04 NOTE — TOC Progression Note (Signed)
 Transition of Care [facility]) - Progression Note    Patient Details  Name: Nicholas Gross MRN: [id number] Date of Birth: 1928/01/01  Transition of [facility]) CM/SW Contact  Areta Beer, RN Phone Number: 10/04/2023, 12:06 PM  Clinical Narrative:  4/13: Hand off only indicates patient has authorization to go to [facility] Monday.     Katheryn Pandy MSN RN CM  RN Case Manager [place]  Transitions of Care Direct Dial: [phone] (Weekends Only) [facility] Main Office Phone: [phone] [facility] Fax: [phone] [email or web address]     Expected Discharge Plan: Skilled Nursing Facility Barriers to Discharge: Continued Medical Work up  Expected Discharge Plan and Services   Discharge Planning Services: CM Consult   Living arrangements for the past 2 months: Single Family Home                                       Social Determinants of Health (SDOH) Interventions SDOH Screenings   Food Insecurity: Patient Declined (09/29/2023)  Housing: Patient Declined (09/29/2023)  Transportation Needs: Patient Declined (09/29/2023)  Utilities: Patient Declined (09/29/2023)  Alcohol Screen: Low Risk  (05/13/2021)  Depression (PHQ2-9): Low Risk  (07/06/2023)  Recent Concern: Depression (PHQ2-9) - Medium Risk (06/01/2023)  Financial Resource Strain: Low Risk  (04/20/2023)   Received from [facility]  Physical Activity: Unknown (05/13/2021)  Social Connections: Patient Declined (09/29/2023)  Stress: No Stress Concern Present (05/13/2021)  Tobacco Use: Medium Risk (09/21/2023)   Received from [facility]    Readmission Risk Interventions     No data to display

## 2023-10-04 NOTE — Plan of Care (Signed)
   Problem: Safety: Goal: Ability to remain free from injury will improve Outcome: Progressing

## 2023-10-04 NOTE — Progress Notes (Signed)
 Progress Note   Patient: Nicholas Gross:[id number] DOB: 06/05/1928 DOA: 09/28/2023     5 DOS: the patient was seen and examined on 10/04/2023   Brief hospital course: Nicholas Gross is a 89 y.o. Caucasian male with medical history significant for coronary artery disease, hypertension CVA with residual right-sided weakness and dyslipidemia, who presented to the emergency room with acute onset of confusion.  The patient lives alone in a trailer and ambulates with a walker.  According to his niece he was last seen normal the day prior to his admission at about 2 PM.  On the day of admission at 2 PM his neighbor found him on the ground under a table before calling EMS.  The patient is able to ambulate with a walker at baseline.  He was unable to give much history apart from stating his name.  It is unclear how long the patient had been on the ground for.   ED Course: When the patient came to the ER, BP was 208/82 with otherwise normal vital signs.  Labs revealed hypokalemia of 2.6 and a BUN of 25 and AST 43 with total bili of 5.8 and total bili 1.5.  Total CK was 719 and high sensitive troponin I was 52 >> 21.  CBC showed leukocytosis 13.6 with neutrophilia as well as mild anemia respiratory panel came back negative.  UA came back negative.     04/12 Hospital course has been complicated by episode of traumatic hematuria which has resolved.    Assessment and Plan:  Acute encephalopathy Presumed metabolic Improved. More awake and alert. Speech remains slow and difficult to understand but patient is at his baseline ??  Patient has been seen by neurology in the out patient setting for possible parkinsonism versus essential tremor due to right-sided action tremor. MRI of the brain without contrast showed no acute intracranial finding. Aging brain and asymmetric left hippocampal atrophy.     Dysphagia Noted to have dysphagia with solids which seems to be an ongoing problem Appreciate speech  therapy input.  Patient noted to have moderate aspiration risk.   Patient was reassessed by speech therapy and they recommend mechanical soft diet/ with thin liquids (Dysphagia 2).  Administer pills whole and in pure.  Patient needs assistance with meals      Acute urinary retention without infection Hematuria (Resolved) Foley catheter was inserted in the ER with drainage of 2100 mL of urine Continue Flomax for presumed BPH Overnight 04/09, patient was said to have tried pulling out his Foley catheter and now has traumatic hematuria which has resolved. Continue to hold Lovenox.  SCD for DVT prophylaxis Will likely discharge home with Foley catheter to follow-up with urology as an outpatient     Fall at home, initial encounter Associated with acute rhabdomyolysis which has improved with IVF hydration Patient sustained pelvic fracture with acute right superior and inferior pubic rami fractures. Appreciate orthopedic surgery input.  Recommends nonsurgical management.  Patient would need at least 6 to 8 weeks for fractures to heal and for him to become sufficiently comfortable to resume full weightbearing Pain management  PT consult.  Patient may weight-bear as tolerated on his right leg and uses a walker for balance and support. Pain control as needed     Hypokalemia Supplement potassium     Essential hypertension Continue lisinopril     Hypernatremia Most likely iatrogenic Improved following administration of free water            Subjective:  Patient is seen and examined at the bedside  Physical Exam: Vitals:   10/03/23 2000 10/04/23 0500 10/04/23 0550 10/04/23 0722  BP: (!) 104/57 115/62 (!) 157/67 (!) 158/72  Pulse: 71 76 61 63  Resp: 18 20 16 16   Temp: 98.2 F (36.8 C) 98 F (36.7 C) 98.3 F (36.8 C) 98 F (36.7 C)  TempSrc: Oral Oral Oral Oral  SpO2: 95% 97% 99% 97%  Weight:      Height:       GENERAL:  89 y.o.-year-old Caucasian male patient lying in the  bed with no acute distress.  Awake, alert and oriented to person.  Able to answer simple questions but speech is hard to understand EYES: Pupils equal, round, reactive to light and accommodation. No scleral icterus. Extraocular muscles intact.  HEENT: Head atraumatic, normocephalic. Oropharynx and nasopharynx clear.  NECK:  Supple, no jugular venous distention. No thyroid enlargement, no tenderness.  LUNGS: Normal breath sounds bilaterally, no wheezing, rales,rhonchi or crepitation. No use of accessory muscles of respiration.  CARDIOVASCULAR: Regular rate and rhythm, S1, S2 normal. No murmurs, rubs, or gallops.  ABDOMEN: Soft, nondistended, nontender. Bowel sounds present. No organomegaly or mass.  EXTREMITIES: No pedal edema, cyanosis, or clubbing.  NEUROLOGIC: Cranial nerves II through XII are intact. Muscle strength 5/5 in all extremities. Sensation intact. Gait not checked.  PSYCHIATRIC: The patient is alert and oriented to person.   SKIN: No obvious rash, lesion, or ulcer.    Data Reviewed:  Labs reviewed  Family Communication: Plan of care discussed with patient's sister at the bedside.  She verbalizes understanding and agrees with the plan.  Disposition: Status is: Inpatient Remains inpatient appropriate because: For discharge to skilled nursing facility.  Planned Discharge Destination: Skilled nursing facility    Time spent: 32 minutes  Author: Read Camel, MD 10/04/2023 11:12 AM  For on call review [email or web address].

## 2023-10-04 NOTE — Progress Notes (Signed)
 Speech Language Pathology Treatment: Dysphagia  Patient Details Name: Nicholas Gross MRN: [id number] DOB: 12/05/1927 Today's Date: 10/04/2023 Time: 0925-1000 SLP Time Calculation (min) (ACUTE ONLY): 35 min  Assessment / Plan / Recommendation Clinical Impression  Pt seen for f/u assessment of swallowing; assessment of toleration of mech soft diet recommended last session/week. Per NSG, pt appeared to have difficulty masticating, then coughing, w/ increased textured foods during dinner meal yesterday. No changes in ANS per chart. Pt does have LOOSE Dentures at Baseline.  This morning, he is alert; he engaged verbally, though slow responses and mumbled speech -- Baseline per Sister, chart. Generalized weakness overall. He was able to participate in self-feeding by holding Cup to drink. He followed basic commands w/ Cue. LOOSE Dentures. On RA, afebrile. WBC WNL.   Pt appears to present w/ functional oropharyngeal phase swallowing w/ No overt clinical s/s of aspiration noted during po trials of Purees and thin liquids; pt exhibited increased oral phase time and mastication effort w/ increased textured foods(both mech soft and minced textures). Sister noted also that he "seemed to like the Puree foods best" -- suspect oral phase-friendliest.  Pt presents w/ risk for aspiration/aspiration pneumonia in setting current comorbidities but this risk can be reduced by following general aspiration precautions and giving him feeding support during meals; 100% Supervision. Unsure of pt's COGNITIVE FUNCTION and/or if any decline(?). ANY COGNITIVE DECLINE can impact awareness/timing of during swallowing and increase risk for aspiration/aspiration pneumonia.  Pt has other challenging factors that could impact oropharyngeal swallowing to include significant deconditioning/weakness, concern for Neurological issues(per chart), Esophageal phase Dysmotility-(Seen by SLP at [facility] 04/17/23- reporting globus sensation with  solids- recommending GI follow up), vision deficits, LOOSE Dentures, and advanced Age as well as current hospitalization. These factors can increase risk for aspiration, dysphagia as well as decreased oral intake overall.    During po trials, pt consumed Purees and the thin liquids via cup w/ no immediate, overt coughing, decline in vocal quality, or change in respiratory presentation during/post trials. Oral phase appeared [facility] w/ timely bolus management and control of bolus propulsion for A-P transfer for swallowing. Oral clearing achieved w/ all trials. Pt helped to feed self by [employer], which improves safety of swallowing. Have noted MIN+ IMPULSIVENESS which requires monitoring. Pt would benefit from Supervision to reduce risk of choking.    Recommend modify diet to a Pureed consistency diet w/ thin liquids; well-moistened foods w/ Gravies and Condiments. Thin liquids -- carefully monitor, and pt should help to Hold Cup when drinking. NO straws if coughing noted. Recommend general aspiration precautions, 100% support and Supervision at meals. Small sips and bites, Slowly. Pills WHOLE vs CRUSHED in Puree for safer, easier swallowing now and at D/C. REFLUX PRECAUTIONS per chart h/o Esophageal phase Dysmotility -- see [facility] notes.     Education given on Pills in Puree; Puree food consistencies and easy to eat options; general aspiration precautions to pt and Sister. No further Acute ST services indicated at this time. F/u at his next venue of care/SNF if needs indicate. NSG updated, agreed. MD updated. Recommend Dietician and Palliative Care f/u for support.      HPI HPI: "Nicholas Gross is a 89 y.o. Caucasian male (unsure of Baseline Cognitive functioning as he lives alone?) with medical history significant for coronary artery disease, hypertension CVA with residual right-sided weakness and dyslipidemia, who presented to the emergency room with acute onset of confusion.  The patient lives alone in a  trailer and  ambulates with a walker.  According to his niece he was last seen normal the day prior to his admission at about 2 PM.  On the day of admission at 2 PM his neighbor found him on the ground under a table before calling EMS.  The patient is able to ambulate with a walker at baseline.  He was unable to give much history apart from stating his name.  It is unclear how long the patient had been on the ground for." MRI Brain 4/8: No acute intracranial finding.  2. Aging brain and asymmetric left hippocampal atrophy.      SLP Plan  All goals met      Recommendations for follow up therapy are one component of a multi-disciplinary discharge planning process, led by the attending physician.  Recommendations may be updated based on patient status, additional functional criteria and insurance authorization.    Recommendations  Diet recommendations: Dysphagia 1 (puree);Thin liquid Liquids provided via: Cup Medication Administration: Whole meds with puree (vs CRUSHED in Puree) Supervision: Patient able to self feed;Staff to assist with self feeding;Full supervision/cueing for compensatory strategies Compensations: Minimize environmental distractions;Slow rate;Small sips/bites;Lingual sweep for clearance of pocketing;Multiple dry swallows after each bite/sip;Follow solids with liquid Postural Changes and/or Swallow Maneuvers: Out of bed for meals;Seated upright 90 degrees;Upright 30-60 min after meal (REFLUX precs.)                 (Palliative Care; Dietician) Oral care BID;Oral care before and after PO;Staff/trained caregiver to provide oral care (Denture care)   Frequent or constant Supervision/Assistance (at meals) Dysphagia, oropharyngeal phase (R13.12) (Advanced Age; deconditioned; medical comorbidities; LOOSE Dentures)     All goals met       Darla Edward, MS, CCC-SLP Speech Language Pathologist Rehab Services; [facility] [phone]  (ascom) Teagen Bucio  10/04/2023, 11:39 AM

## 2023-10-05 DIAGNOSIS — L120 Bullous pemphigoid: Secondary | ICD-10-CM | POA: Diagnosis present

## 2023-10-05 DIAGNOSIS — G9340 Encephalopathy, unspecified: Secondary | ICD-10-CM | POA: Diagnosis not present

## 2023-10-05 DIAGNOSIS — S32599G Other specified fracture of unspecified pubis, subsequent encounter for fracture with delayed healing: Secondary | ICD-10-CM

## 2023-10-05 DIAGNOSIS — R532 Functional quadriplegia: Secondary | ICD-10-CM | POA: Diagnosis present

## 2023-10-05 LAB — CK: Total CK: 53 U/L (ref 49–397)

## 2023-10-05 MED ORDER — PREDNISONE 20 MG PO TABS
20.0000 mg | ORAL_TABLET | Freq: Once | ORAL | Status: AC
  Administered 2023-10-05: 20 mg via ORAL
  Filled 2023-10-05: qty 1

## 2023-10-05 MED ORDER — TAMSULOSIN HCL 0.4 MG PO CAPS: 0.4000 mg | ORAL_CAPSULE | Freq: Every day | ORAL | 0 refills | Status: DC

## 2023-10-05 MED ORDER — ACETAMINOPHEN 325 MG PO TABS: 650.0000 mg | ORAL_TABLET | ORAL | 2 refills | Status: DC | PRN

## 2023-10-05 NOTE — Discharge Summary (Addendum)
 Physician Discharge Summary   Patient: Nicholas Gross MRN: [id number] DOB: 1928-06-10  Admit date:     09/28/2023  Discharge date: 10/05/23  Discharge Physician: Tahjai Schetter   PCP: Solomon Dupre, DO   Recommendations at discharge:   Weightbearing as tolerated on the right leg and use a walker for balance and support Follow-up with orthopedic surgery in 1 month for repeat x-rays of his pelvis  Discharge Diagnoses: Principal Problem:   Acute encephalopathy Active Problems:   Fall at home, initial encounter   Acute urinary retention   Fracture of pubic ramus with delayed healing, unspecified laterality   Hematoma of extraperitoneal space   Hypokalemia   Dyslipidemia   Essential hypertension   Hypernatremia   Pressure injury of skin   Bullous pemphigoid   Functional quadriplegia (HCC)  Resolved Problems:   * No resolved hospital problems. *  Hospital Course:  Nicholas Gross is a 89 y.o. Caucasian male with medical history significant for coronary artery disease, hypertension CVA with residual right-sided weakness and dyslipidemia, who presented to the emergency room with acute onset of ultimate status with confusion.  The patient lives alone in a trailer and ambulates with a walker.  According to his niece he was last seen normal yesterday at 2 PM.  Today at 2 PM his neighbor found him on the ground under a table before calling EMS.  The patient is able to ambulate with a walker at baseline.  He was unable to give much history apart from stating his name.   ED Course: When the patient came to the ER, BP was 208/82 with otherwise normal vital signs.  Labs revealed hypokalemia of 2.6 and a BUN of 25 and AST 43 with total bili of 5.8 and total bili 1.5.  Total CK was 719 and high sensitive troponin I was 5221.  CBC showed leukocytosis 13.6 with neutrophilia as well as mild anemia respiratory panel came back negative.  UA came back negative.     EKG as reviewed by me : EKG  showed sinus rhythm with rate of 62 with PVCs and prolonged QT interval with QTc 510 MS. Imaging: Noncontrast head CT scan showed no acute intracranial normalities.  It showed chronic small vessel ischemic changes of the right matter.   C-spine CT showed degenerative changes with no acute osseous abnormalities.   Pelvic CT revealed the following: 1. Foley catheter within the urinary bladder. Diffuse thick-walled appearance of the urinary bladder with trabeculation suggesting chronic outlet obstruction. No definite extraluminal extravasation of contrast from the bladder. 2. Small volume slightly dense fluid within the extraperitoneal space, suspect small volume extraperitoneal hematoma. 3. Acute appearing right superior and inferior pubic rami fractures. Chronic right inferior pubic ramus fracture. 4. Markedly enlarged prostate.   The patient was given 10 mg of IV potassium chloride.  He will be admitted to a medical telemetry observation bed for further evaluation and management.   Assessment and Plan:  Acute encephalopathy Presumed metabolic Improved. More awake and alert. Speech remains slow and difficult to understand but patient is at his baseline ??  Patient has been seen by neurology in the out patient setting for possible parkinsonism versus essential tremor due to right-sided action tremor. MRI of the brain without contrast showed no acute intracranial finding. Aging brain and asymmetric left hippocampal atrophy. Keep scheduled follow-up appointment with neurology as an outpatient      Dysphagia Noted to have dysphagia with solids which seems to be an ongoing problem  Appreciate speech therapy input.  Patient noted to have moderate aspiration risk.   Patient was reassessed by speech therapy and they recommend mechanical soft diet/ with thin liquids (Dysphagia 1).  Administer pills whole and in pure.  Patient needs assistance with meals       Acute urinary retention without  infection Hematuria (Resolved) Foley catheter was inserted in the ER with drainage of 2100 mL of urine Continue Flomax for presumed BPH Overnight 04/09, patient was said to have tried pulling out his Foley catheter and now has traumatic hematuria which has resolved. Continue to hold Lovenox.  SCD for DVT prophylaxis Will likely discharge home with Foley catheter to follow-up with urology as an outpatient     Fall at home, initial encounter Associated with acute rhabdomyolysis which has improved with IVF hydration Patient sustained pelvic fracture with acute right superior and inferior pubic rami fractures. Appreciate orthopedic surgery input.  Recommends nonsurgical management.  Patient would need at least 6 to 8 weeks for fractures to heal and for him to become sufficiently comfortable to resume full weightbearing Pain management  PT consult.  Patient may weight-bear as tolerated on his right leg and uses a walker for balance and support. Pain control as needed     Hypokalemia Supplement potassium     Essential hypertension Continue lisinopril     Hypernatremia Most likely iatrogenic Improved following administration of free water       History of bullous pemphigoid Continue doxycycline and prednisone Follow-up with dermatology as an outpatient    Functional quadriplegia Patient requires assistance with all activities of daily living  Patient was seen and examined at the bedside and is in stable condition for the      Consultants: Orthopedic surgery Procedures performed: None Disposition: Skilled nursing facility Diet recommendation:  Discharge Diet Orders (From admission, onward)     Start     Ordered   10/05/23 0000  Diet - low sodium heart healthy        10/05/23 0906           Dysphagia type 1 Thin Liquid DISCHARGE MEDICATION: Allergies as of 10/05/2023       Reactions   Other    Patient states that he is allergic to something that they place in  the IV before they work on you   Simvastatin Other (See Comments)   Myalgia        Medication List     STOP taking these medications    Dupilumab 300 MG/2ML Soaj   valACYclovir 1000 MG tablet Commonly known as: VALTREX       TAKE these medications    acetaminophen 325 MG tablet Commonly known as: Tylenol Take 2 tablets (650 mg total) by mouth every 4 (four) hours as needed for moderate pain (pain score 4-6).   aspirin EC 81 MG tablet Take 81 mg by mouth daily. Swallow whole.   clobetasol ointment 0.05 % Commonly known as: TEMOVATE Apply the medication twice daily to stubborn areas of the skin until smooth. Then stop and re-start as the skin changes come back.   cyanocobalamin 250 MCG tablet Commonly known as: VITAMIN B12 Take by mouth.   doxycycline 100 MG tablet Commonly known as: VIBRA-TABS Take 100 mg by mouth 2 (two) times daily.   furosemide 40 MG tablet Commonly known as: LASIX Take 1 tablet (40 mg total) by mouth daily.   lisinopril 5 MG tablet Commonly known as: ZESTRIL Take 1 tablet (5 mg total) by  mouth daily.   predniSONE 10 MG tablet Commonly known as: DELTASONE Take 20 mg by mouth daily with breakfast.   PRESERVISION AREDS PO Take 1 tablet by mouth 2 (two) times daily.   rosuvastatin 10 MG tablet Commonly known as: CRESTOR Take 1 tablet (10 mg total) by mouth daily.   tamsulosin 0.4 MG Caps capsule Commonly known as: FLOMAX Take 1 capsule (0.4 mg total) by mouth daily after supper.   White Petroleum Jelly Gel Apply topically.               Discharge Care Instructions  (From admission, onward)           Start     Ordered   10/05/23 0000  Discharge wound care:       Comments: DuoDERM to sacral area.  Change every 72 hours   10/05/23 0906   10/05/23 0000  Discharge wound care:       Comments: DuoDERM to sacral wound.  Change every 72 hours   10/05/23 4098            Contact information for follow-up providers      Johnson, Megan P, DO Follow up.   Specialty: Family Medicine Why: Hospital follow up Contact information: [address] [place] [address] [phone]              Contact information for after-discharge care     Destination     [facility]erred [facility] .   Service: Skilled Nursing Contact information: [address] [place]  [phone]                    Discharge Exam: Cleavon Curls Weights   09/28/23 1530  Weight: 68.5 kg   GENERAL:  89 y.o.-year-old Caucasian male patient lying in the bed with no acute distress.  Awake, alert and oriented to person.  Able to answer simple questions but speech is hard to understand EYES: Pupils equal, round, reactive to light and accommodation. No scleral icterus. Extraocular muscles intact.  HEENT: Head atraumatic, normocephalic. Oropharynx and nasopharynx clear.  NECK:  Supple, no jugular venous distention. No thyroid enlargement, no tenderness.  LUNGS: Normal breath sounds bilaterally, no wheezing, rales,rhonchi or crepitation. No use of accessory muscles of respiration.  CARDIOVASCULAR: Regular rate and rhythm, S1, S2 normal. No murmurs, rubs, or gallops.  ABDOMEN: Soft, nondistended, nontender. Bowel sounds present. No organomegaly or mass.  EXTREMITIES: No pedal edema, cyanosis, or clubbing.  NEUROLOGIC: Cranial nerves II through XII are intact. Muscle strength 5/5 in all extremities. Sensation intact. Gait not checked.  PSYCHIATRIC: The patient is alert and oriented to person.   SKIN: No obvious rash, lesion, or ulcer.       Condition at discharge: stable  The results of significant diagnostics from this hospitalization (including imaging, microbiology, ancillary and laboratory) are listed below for reference.   Imaging Studies: MR BRAIN WO CONTRAST Result Date: 09/29/2023 CLINICAL DATA:  Mental status change with  unknown cause EXAM: MRI HEAD WITHOUT CONTRAST TECHNIQUE: Multiplanar, multiecho pulse sequences of the brain and surrounding structures were obtained without intravenous contrast. COMPARISON:  Head CT from yesterday FINDINGS: Brain: No acute infarction, hemorrhage, hydrocephalus, extra-axial collection or mass lesion. Periventricular FLAIR hyperintensity attributed to mild chronic small vessel ischemia. Cerebral volume loss which is also overall mild for age. There is asymmetric atrophy of the medial left temporal lobe including the hippocampus with increased T2  signal, also seen in 2023, of questionable significance with no chart history of epilepsy. Vascular: Major flow voids are preserved. Skull and upper cervical spine: No focal marrow lesion. Posterior scalp swelling asymmetric to the left Sinuses/Orbits: No acute finding. IMPRESSION: 1. No acute intracranial finding. 2. Aging brain and asymmetric left hippocampal atrophy. Electronically Signed   By: Tiburcio Pea M.D.   On: 09/29/2023 05:34   CT ABDOMEN PELVIS W CONTRAST Addendum Date: 09/29/2023 ADDENDUM REPORT: 09/29/2023 00:37 ADDENDUM: Wrong template dictated under this exam due to technical confusion. CLINICAL DATA:  Abnormal CT, fluid adjacent to the bladder Examination: CT abdomen and pelvis with contrast TECHNIQUE: Multi detector CT imaging of the abdomen and pelvis performed following intravenous contrast. Delayed images of the kidneys were also obtained. CONTRAST:  80 mL Omnipaque 300 intravenous COMPARISON:  CT 09/28/2023 FINDINGS: Lung bases: See separately dictated chest CT Hepatobiliary: No calcified gallstones. Subcentimeter hypodensities within the left hepatic lobe too small to further characterize. Pancreas: No inflammatory change.  Atrophic Spleen: Normal in size.  No focal abnormality. Adrenal/urinary tract: Normal adrenal glands. No hydronephrosis. Decompressed urinary bladder with Foley catheter. Bladder is thick walled.  Stomach/bowel: The stomach is decompressed. No dilated small bowel. No acute bowel wall thickening. Vascular/lymphatic: Advanced aortic atherosclerosis.  No aneurysm. Reproductive: Marked enlargement of the prostate Other: No free air. Calcified mesenteric mass as described on prior exam. Small volume hyperdense fluid anterior to the bladder Musculoskeletal: Old appearing right inferior pubic ramus fracture. Acute appearing mildly displaced right superior and inferior pubic ramus fractures. No pubic symphysis widening. IMPRESSION: 1. Acute on chronic appearing fractures involving the right superior and inferior pubic rami. Small amount of hyperdense fluid and stranding within the pre vesicle space and inguinal regions suspected to represent small amount of extraperitoneal blood/hematoma 2. Pelvic CT with delayed images through the bladder subsequently performed 3. Calcified mesenteric mass as previously described. Electronically Signed   By: Jasmine Pang M.D.   On: 09/29/2023 00:37   Result Date: 09/29/2023 CLINICAL DATA:  Abnormal CT, fluid adjacent to the bladder concern for bladder trauma EXAM: CT PELVIS WITHOUT CONTRAST TECHNIQUE: Multidetector CT imaging of the pelvis was performed following the standard protocol without additional intravenous contrast. Delayed imaging through the pelvis obtained following clamping of Foley catheter. RADIATION DOSE REDUCTION: This exam was performed according to the departmental dose-optimization program which includes automated exposure control, adjustment of the mA and/or kV according to patient size and/or use of iterative reconstruction technique. COMPARISON:  CT 09/28/2023 FINDINGS: Urinary Tract: Foley catheter within the bladder which is partially decompressed. Bladder appears diffusely thick walled. There is no extraluminal extravasation of contrast within the pelvis. Bowel:  No acute bowel wall thickening Vascular/Lymphatic: Advanced aortic atherosclerosis. No  aneurysm. No suspicious lymph nodes. Reproductive:  Very enlarged prostate Other: No pelvic free air. Partially visualized calcified mesenteric mass. Small volume slightly dense fluid within the left greater than right extraperitoneal space. Musculoskeletal: Chronic right inferior pubic rami fracture. More acute appearing right superior pubic ramus fracture and acute appearing right inferior pubic ramus fracture posteriorly. This likely accounts for the small amount of extraperitoneal hematoma. IMPRESSION: 1. Foley catheter within the bladder which is partially decompressed. Negative for extraluminal extravasation of contrast from the bladder. 2. Diffuse thick-walled appearance of the bladder either due to cystitis or chronic obstruction. 3. Acute appearing right superior and inferior pubic rami fractures superimposed on chronic right pubic ramus fractures. Small volume slightly dense fluid within the left greater than right extraperitoneal space  likely due to small amount of extraperitoneal hematoma. 4. Markedly enlarged prostate 5. Partially visualized calcified mesenteric mass., see previously performed CT chest abdomen pelvis report 6. Aortic atherosclerosis. Electronically Signed: By: Jasmine Pang M.D. On: 09/28/2023 22:16   CT PELVIS W CONTRAST Result Date: 09/29/2023 CLINICAL DATA:  Possible bladder injury EXAM: CT PELVIS WITH CONTRAST TECHNIQUE: Multidetector CT imaging of the pelvis was performed using the standard protocol following the bolus administration of intravenous contrast. RADIATION DOSE REDUCTION: This exam was performed according to the departmental dose-optimization program which includes automated exposure control, adjustment of the mA and/or kV according to patient size and/or use of iterative reconstruction technique. CONTRAST:  80mL OMNIPAQUE IOHEXOL 300 MG/ML  SOLN COMPARISON:  CT 09/28/2023 FINDINGS: Urinary Tract: Foley catheter within the urinary bladder. Diffuse thick-walled  appearance of the urinary bladder with trabeculation. No definite extravasation of contrast from the bladder. Bowel:  No acute bowel wall thickening Vascular/Lymphatic: Vascular calcification. No aneurysm. No suspicious lymph nodes. Reproductive:  Markedly enlarged prostate Other: No free air. Small volume slightly dense fluid within the extraperitoneal space. Musculoskeletal: Chronic right inferior pubic ramus fracture. More acute appearing right superior and inferior mildly displaced pubic rami fractures. IMPRESSION: 1. Foley catheter within the urinary bladder. Diffuse thick-walled appearance of the urinary bladder with trabeculation suggesting chronic outlet obstruction. No definite extraluminal extravasation of contrast from the bladder. 2. Small volume slightly dense fluid within the extraperitoneal space, suspect small volume extraperitoneal hematoma. 3. Acute appearing right superior and inferior pubic rami fractures. Chronic right inferior pubic ramus fracture. 4. Markedly enlarged prostate. Electronically Signed   By: Jasmine Pang M.D.   On: 09/29/2023 00:19   CT CHEST ABDOMEN PELVIS WO CONTRAST Result Date: 09/28/2023 CLINICAL DATA:  Found down.  Blunt trauma. EXAM: CT CHEST, ABDOMEN AND PELVIS WITHOUT CONTRAST TECHNIQUE: Multidetector CT imaging of the chest, abdomen and pelvis was performed following the standard protocol without IV contrast. RADIATION DOSE REDUCTION: This exam was performed according to the departmental dose-optimization program which includes automated exposure control, adjustment of the mA and/or kV according to patient size and/or use of iterative reconstruction technique. COMPARISON:  09/22/2023 PET FINDINGS: CT CHEST FINDINGS Cardiovascular: Aortic atherosclerosis. Ascending aortic dilatation at 4.3 cm on 35/2. Tortuous thoracic aorta. Mild cardiomegaly, without pericardial effusion. Three vessel coronary artery calcification. No mediastinal hematoma. Mediastinum/Nodes: No  mediastinal or hilar adenopathy, given limitations of unenhanced CT. Lungs/Pleura: No pleural fluid. No pneumothorax. No pulmonary contusion. Areas of subsegmental atelectasis in both lung bases. Left upper lobe scarring laterally. Minimal ground-glass in the right upper lobe with septal thickening could represent minimal fluid overload (32/4). Musculoskeletal: Included within the abdomen pelvic section. CT ABDOMEN PELVIS FINDINGS Hepatobiliary: Normal noncontrast appearance of the liver, gallbladder, biliary tract. Pancreas: Normal pancreas for age. No duct dilatation or acute inflammation. Spleen: In additional lack of IV contrast, degradation secondary to EKG wire and lead artifacts. Normal in size, without focal abnormality. Adrenals/Urinary Tract: Normal adrenal glands. No renal calculi or hydronephrosis. No hydroureter or ureteric calculi. The bladder is moderately distended. Stomach/Bowel: Normal stomach, without wall thickening. Large stool ball in the rectum. Normal small bowel. Vascular/Lymphatic: Advanced aortic and branch vessel atherosclerosis. Calcified small bowel mesenteric mass again identified at 4.0 x 2.9 cm on 72/2, similar to last week's PET. Reproductive: Moderate prostatomegaly. Other: No pelvic free fluid or free intraperitoneal air. Subtle possible fluid adjacent the distended bladder including at the entrance to the left inguinal canal including on 98/2. Musculoskeletal: Remote right pubic rami  fractures. Remote bilateral rib fractures. Multiple thoracolumbar compression deformities. Most significant at T5, T9, T10 and thoracic spine. Present on 09/22/2023 PET. Mild L1 and L3 compression deformities are present on 03/02/2023 CT. IMPRESSION: 1. Multifactorial degradation, including lack of oral/IV contrast, EKG wires and leads. 2. Thoracic compression deformities were present on 09/22/2023 PET. No posttraumatic deformity in the chest. 3. Possible subtle fluid adjacent the left side of the  distended urinary bladder. If there is significant pelvic trauma, such that bladder or bowel injury are concerns, consider contrast enhanced (oral and IV) pelvic CT. 4. Calcified mesenteric mass, similar to recent PET. 5. Ascending aortic aneurysm at 4.2 cm. If this 89 year old patient is a treatment candidate, this could be re-evaluated with dedicated CTA at 1 year. 6. Incidental findings, including: Coronary artery atherosclerosis. Aortic Atherosclerosis (ICD10-I70.0). Prostatomegaly with bladder distension. Electronically Signed   By: Jeronimo Greaves M.D.   On: 09/28/2023 17:55   CT HEAD WO CONTRAST ( ) Result Date: 09/28/2023 CLINICAL DATA:  Head trauma EXAM: CT HEAD WITHOUT CONTRAST CT CERVICAL SPINE WITHOUT CONTRAST TECHNIQUE: Multidetector CT imaging of the head and cervical spine was performed following the standard protocol without intravenous contrast. Multiplanar CT image reconstructions of the cervical spine were also generated. RADIATION DOSE REDUCTION: This exam was performed according to the departmental dose-optimization program which includes automated exposure control, adjustment of the mA and/or kV according to patient size and/or use of iterative reconstruction technique. COMPARISON:  CT brain and cervical spine 06/17/2022 FINDINGS: CT HEAD FINDINGS Brain: No acute territorial infarction, hemorrhage or intracranial mass. Moderate advanced atrophy. Mild chronic small vessel ischemic changes of the white matter. Stable ventricle size Vascular: No hyperdense vessels.  Carotid vascular calcification Skull: Normal. Negative for fracture or focal lesion. Sinuses/Orbits: Mucosal thickening in the sinuses Other: None CT CERVICAL SPINE FINDINGS Alignment: Stable trace anterolisthesis C4 on C5. Facet alignment is maintained. Skull base and vertebrae: No acute fracture. No primary bone lesion or focal pathologic process. Mild chronic appearing superior endplate deformity at T3. Soft tissues and spinal  canal: No prevertebral fluid or swelling. No visible canal hematoma. Disc levels: Moderate severe disc space narrowing and degenerative change C4-C5, C5-C6 and C6-C7. Facet degenerative changes at multiple levels with foraminal narrowing. Upper chest: Negative. Other: None IMPRESSION: 1. No CT evidence for acute intracranial abnormality. Atrophy and chronic small vessel ischemic changes of the white matter. 2. Degenerative changes of the cervical spine. No acute osseous abnormality. Electronically Signed   By: Jasmine Pang M.D.   On: 09/28/2023 17:49   CT Cervical Spine Wo Contrast Result Date: 09/28/2023 CLINICAL DATA:  Head trauma EXAM: CT HEAD WITHOUT CONTRAST CT CERVICAL SPINE WITHOUT CONTRAST TECHNIQUE: Multidetector CT imaging of the head and cervical spine was performed following the standard protocol without intravenous contrast. Multiplanar CT image reconstructions of the cervical spine were also generated. RADIATION DOSE REDUCTION: This exam was performed according to the departmental dose-optimization program which includes automated exposure control, adjustment of the mA and/or kV according to patient size and/or use of iterative reconstruction technique. COMPARISON:  CT brain and cervical spine 06/17/2022 FINDINGS: CT HEAD FINDINGS Brain: No acute territorial infarction, hemorrhage or intracranial mass. Moderate advanced atrophy. Mild chronic small vessel ischemic changes of the white matter. Stable ventricle size Vascular: No hyperdense vessels.  Carotid vascular calcification Skull: Normal. Negative for fracture or focal lesion. Sinuses/Orbits: Mucosal thickening in the sinuses Other: None CT CERVICAL SPINE FINDINGS Alignment: Stable trace anterolisthesis C4 on C5. Facet alignment is maintained. Skull  base and vertebrae: No acute fracture. No primary bone lesion or focal pathologic process. Mild chronic appearing superior endplate deformity at T3. Soft tissues and spinal canal: No prevertebral  fluid or swelling. No visible canal hematoma. Disc levels: Moderate severe disc space narrowing and degenerative change C4-C5, C5-C6 and C6-C7. Facet degenerative changes at multiple levels with foraminal narrowing. Upper chest: Negative. Other: None IMPRESSION: 1. No CT evidence for acute intracranial abnormality. Atrophy and chronic small vessel ischemic changes of the white matter. 2. Degenerative changes of the cervical spine. No acute osseous abnormality. Electronically Signed   By: Jasmine Pang M.D.   On: 09/28/2023 17:49   NM PET DOTATATE SKULL BASE TO MID THIGH Result Date: 09/22/2023 CLINICAL DATA:  Mesenteric mass. Elevated chromogranin 4.3. History of diarrhea EXAM: NUCLEAR MEDICINE PET SKULL BASE TO THIGH TECHNIQUE: 4.3 mCi copper 64 DOTATATE was injected intravenously. Full-ring PET imaging was performed from the skull base to thigh after the radiotracer. CT data was obtained and used for attenuation correction and anatomic localization. COMPARISON:  CT 02/28/2023, FDG PET scan 06/12/2023 FINDINGS: NECK No radiotracer activity in neck lymph nodes. Incidental CT findings: None CHEST No radiotracer accumulation within mediastinal or hilar lymph nodes. No suspicious pulmonary nodules on the CT scan. Incidental CT finding:Coronary artery calcification and aortic atherosclerotic calcification. ABDOMEN/PELVIS Again demonstrated predominately calcified LEFT central mesenteric mass measuring 3.7 x 2.9 cm on image 84. This mass lesion has no radiotracer avidity. No abnormal radiotracer activity within the pancreas. Physiologic activity noted in the proximal small bowel. No evidence abnormal activity liver. No radiotracer avid adenopathy. Scattered activity in the prostate gland is nonspecific. Physiologic activity noted in the liver, spleen, adrenal glands and kidneys. Incidental CT findings:Atherosclerotic calcification of the aorta. SKELETON No focal activity to suggest skeletal metastasis. Incidental CT  findings:None IMPRESSION: 1. No radiotracer activity associated with the calcified LEFT central mesenteric mass. 2. No activity within the abdomen pelvis to localize well differentiated neuroendocrine tumor.No thoracic neuroendocrine tumor. 3. Enlarged prostate gland with nonspecific radiotracer activity. Recommend correlation with PSA. Electronically Signed   By: Genevive Bi M.D.   On: 09/22/2023 13:22    Microbiology: Results for orders placed or performed during the hospital encounter of 09/28/23  Resp panel by RT-PCR (RSV, Flu A&B, Covid) Anterior Nasal Swab     Status: None   Collection Time: 09/28/23  4:47 PM   Specimen: Anterior Nasal Swab  Result Value Ref Range Status   SARS Coronavirus 2 by RT PCR NEGATIVE NEGATIVE Final    Comment: (NOTE) SARS-CoV-2 target nucleic acids are NOT DETECTED.  The SARS-CoV-2 RNA is generally detectable in upper respiratory specimens during the acute phase of infection. The lowest concentration of SARS-CoV-2 viral copies this assay can detect is 138 copies/mL. A negative result does not preclude SARS-Cov-2 infection and should not be used as the sole basis for treatment or other patient management decisions. A negative result may occur with  improper specimen collection/handling, submission of specimen other than nasopharyngeal swab, presence of viral mutation(s) within the areas targeted by this assay, and inadequate number of viral copies(<138 copies/mL). A negative result must be combined with clinical observations, patient history, and epidemiological information. The expected result is Negative.  Fact Sheet for Patients:  BloggerCourse.com  Fact Sheet for Healthcare Providers:  SeriousBroker.it  This test is no t yet approved or cleared by the [place] FDA and  has been authorized for detection and/or diagnosis of SARS-CoV-2 by FDA under an Emergency Use Authorization (  EUA). This  EUA will remain  in effect (meaning this test can be used) for the duration of the COVID-19 declaration under Section 564(b)(1) of the Act, 21 U.S.C.section 360bbb-3(b)(1), unless the authorization is terminated  or revoked sooner.       Influenza A by PCR NEGATIVE NEGATIVE Final   Influenza B by PCR NEGATIVE NEGATIVE Final    Comment: (NOTE) The Xpert Xpress SARS-CoV-2/FLU/RSV plus assay is intended as an aid in the diagnosis of influenza from Nasopharyngeal swab specimens and should not be used as a sole basis for treatment. Nasal washings and aspirates are unacceptable for Xpert Xpress SARS-CoV-2/FLU/RSV testing.  Fact Sheet for Patients: BloggerCourse.com  Fact Sheet for Healthcare Providers: SeriousBroker.it  This test is not yet approved or cleared by the [place]  FDA and has been authorized for detection and/or diagnosis of SARS-CoV-2 by FDA under an Emergency Use Authorization (EUA). This EUA will remain in effect (meaning this test can be used) for the duration of the COVID-19 declaration under Section 564(b)(1) of the Act, 21 U.S.C. section 360bbb-3(b)(1), unless the authorization is terminated or revoked.     Resp Syncytial Virus by PCR NEGATIVE NEGATIVE Final    Comment: (NOTE) Fact Sheet for Patients: BloggerCourse.com  Fact Sheet for Healthcare Providers: SeriousBroker.it  This test is not yet approved or cleared by the [place]  FDA and has been authorized for detection and/or diagnosis of SARS-CoV-2 by FDA under an Emergency Use Authorization (EUA). This EUA will remain in effect (meaning this test can be used) for the duration of the COVID-19 declaration under Section 564(b)(1) of the Act, 21 U.S.C. section 360bbb-3(b)(1), unless the authorization is terminated or revoked.  Performed at [facility], [address] [place], [place]  [address]     Labs: CBC: Recent Labs  Lab 09/28/23 1533 09/29/23 0523 09/30/23 0844 10/01/23 0837 10/02/23 0828 10/04/23 0553  WBC 13.6* 14.5* 12.4* 12.4* 9.6 9.0  NEUTROABS 11.7*  --   --   --   --   --   HGB 12.1* 12.2* 11.6* 9.3* 9.4* 9.4*  HCT 35.5* 35.8* 34.6* 27.9* 27.3* 28.1*  MCV 96.7 98.9 99.4 99.6 98.9 99.6  PLT 170 170 161 134* 148* 154   Basic Metabolic Panel: Recent Labs  Lab 09/28/23 1533 09/29/23 0523 09/30/23 0844 10/01/23 0837 10/02/23 0828 10/04/23 0553  NA 145 146* 148* 144 144 135  K 2.6* 3.0* 3.4* 3.4* 3.7 4.5  CL 106 111 114* 112* 112* 105  CO2 29 26 27 26 27 27   GLUCOSE 127* 85 123* 96 91 97  BUN 25* 20 20 32* 29* 31*  CREATININE 1.02 0.92 0.91 1.02 0.99 0.87  CALCIUM 9.3 9.1 8.8* 8.5* 8.4* 8.4*  MG 2.0 2.1  --   --   --   --    Liver Function Tests: Recent Labs  Lab 09/28/23 1533  AST 43*  ALT 27  ALKPHOS 41  BILITOT 1.5*  PROT 5.8*  ALBUMIN 3.8   CBG: Recent Labs  Lab 10/03/23 0828  GLUCAP 108*    Discharge time spent: greater than 30 minutes.  Signed: Read Camel, MD [facility] 10/05/2023

## 2023-10-05 NOTE — NC FL2 (Signed)
  MEDICAID [id number] LEVEL OF CARE FORM     IDENTIFICATION  Patient Name: Nicholas Gross Birthdate: [birth year] Sex: male Admission Date (Current Location): 09/28/2023  [place] and [place] Number:  Chiropodist and Address:  [facility], [address] [place], [place] [address]      Provider Number: [id number]  Attending Physician Name and Address:  Lucile Shutters, MD  Relative Name and Phone Number:  Philippa Sicks [phone]    Current Level of Care: Hospital Recommended Level of Care: Skilled Nursing Facility Prior Approval Number:    Date Approved/Denied:   PASRR Number:  [id number]   Discharge Plan: SNF    Current Diagnoses: Patient Active Problem List   Diagnosis Date Noted   Fracture of pubic ramus with delayed healing, unspecified laterality 10/05/2023   Bullous pemphigoid 10/05/2023   Functional quadriplegia (HCC) 10/05/2023   Hypernatremia 09/30/2023   Pressure injury of skin 09/30/2023   Fall at home, initial encounter 09/29/2023   Acute urinary retention 09/29/2023   Essential hypertension 09/29/2023   Hypokalemia 09/29/2023   Hematoma of extraperitoneal space 09/29/2023   Acute encephalopathy 09/28/2023   Weight loss 06/05/2023   Bullous disorder 06/01/2023   Mesenteric mass 06/01/2023   Dysarthria 04/17/2023   Tremor 04/17/2023   Aphasia 04/10/2023   Dysphagia 04/10/2023   History of falling 03/24/2023   Long term (current) use of aspirin 03/24/2023   Occlusion and stenosis of unspecified carotid artery 03/24/2023   Pain and swelling of lower leg 09/15/2022   Right acetabular fracture (HCC) 06/17/2022   Aortic stenosis, mild 04/25/2022   Cellulitis of both lower extremities 04/24/2022   Aneurysm of ascending aorta without rupture (HCC) 12/23/2021   Compression fracture of L1 lumbar vertebra (HCC) 12/23/2021   Pulmonary nodule 12/23/2021   Right inguinal hernia 12/23/2021   Osteoarthritis of right  hip 12/23/2021   Aortic atherosclerosis (HCC) 12/23/2021   Multiple falls 12/12/2021   Left rib fracture 12/12/2021   Senile purpura (HCC) 11/08/2021   PAD (peripheral artery disease) (HCC) 07/15/2020   Lymphedema 05/06/2019   Dyslipidemia 02/13/2016   Macular degeneration 08/15/2015   Carotid atherosclerosis 08/15/2015   Allergic rhinitis due to pollen 02/12/2015   Hypertension    CAD (coronary artery disease)    H/O cardiac catheterization 06/12/2014   History of left-sided carotid endarterectomy 06/12/2014    Orientation RESPIRATION BLADDER Height & Weight     Self    External catheter Weight: 68.5 kg Height:  5\' 9"  (175.3 cm)  BEHAVIORAL SYMPTOMS/MOOD NEUROLOGICAL BOWEL NUTRITION STATUS      Incontinent Diet  AMBULATORY STATUS COMMUNICATION OF NEEDS Skin     Verbally (slurred speech)                         Personal Care Assistance Level of Assistance              Functional Limitations Info  Speech     Speech Info: Impaired    SPECIAL CARE FACTORS FREQUENCY  PT (By licensed PT), OT (By licensed OT)     PT Frequency: 5 x week OT Frequency: 5 x week            Contractures      Additional Factors Info  Code Status, Allergies Code Status Info: DNR Allergies Info: Simvastatin & "Something they place in the IV before the work on you"           Current Medications (  10/05/2023):  This is the current hospital active medication list Current Facility-Administered Medications  Medication Dose Route Frequency Provider Last Rate Last Admin   acetaminophen (TYLENOL) tablet 650 mg  650 mg Oral Q6H PRN Mansy, Jan A, MD   650 mg at 10/05/23 1610   Or   acetaminophen (TYLENOL) suppository 650 mg  650 mg Rectal Q6H PRN Mansy, Jan A, MD   650 mg at 09/29/23 1618   Chlorhexidine Gluconate Cloth 2 % PADS 6 each  6 each Topical Daily Agbata, Tochukwu, MD   6 each at 10/05/23 0820   clobetasol ointment (TEMOVATE) 0.05 %   Topical BID PRN Mansy, Jan A, MD   Given  at 10/01/23 1203   cyanocobalamin (VITAMIN B12) tablet 250 mcg  250 mcg Oral Daily Mansy, Jan A, MD   250 mcg at 10/05/23 0819   feeding supplement (ENSURE ENLIVE / ENSURE PLUS) liquid 237 mL  237 mL Oral BID BM Agbata, Tochukwu, MD   237 mL at 10/05/23 0821   hydrALAZINE (APRESOLINE) injection 10 mg  10 mg Intravenous Q6H PRN Mansy, Jan A, MD   10 mg at 10/02/23 0348   lisinopril (ZESTRIL) tablet 5 mg  5 mg Oral Daily Mansy, Jan A, MD   5 mg at 10/05/23 9604   magnesium hydroxide (MILK OF MAGNESIA) suspension 30 mL  30 mL Oral Daily PRN Mansy, Jan A, MD       multivitamin (PROSIGHT) tablet 1 tablet  1 tablet Oral Daily Mansy, Jan A, MD   1 tablet at 10/05/23 0819   ondansetron (ZOFRAN) tablet 4 mg  4 mg Oral Q6H PRN Mansy, Jan A, MD       Or   ondansetron [facility]) injection 4 mg  4 mg Intravenous Q6H PRN Mansy, Jan A, MD       Oral care mouth rinse  15 mL Mouth Rinse 4 times per day Agbata, Tochukwu, MD   15 mL at 10/05/23 5409   Oral care mouth rinse  15 mL Mouth Rinse PRN Agbata, Tochukwu, MD       polyethylene glycol (MIRALAX / GLYCOLAX) packet 17 g  17 g Oral Daily Agbata, Tochukwu, MD   17 g at 10/04/23 0933   polyvinyl alcohol (LIQUIFILM TEARS) 1.4 % ophthalmic solution 1 drop  1 drop Both Eyes PRN Agbata, Tochukwu, MD   1 drop at 10/02/23 0936   predniSONE (DELTASONE) tablet 20 mg  20 mg Oral Once Agbata, Tochukwu, MD       rosuvastatin (CRESTOR) tablet 10 mg  10 mg Oral Daily Mansy, Jan A, MD   10 mg at 10/05/23 8119   tamsulosin (FLOMAX) capsule 0.4 mg  0.4 mg Oral QPC supper Agbata, Tochukwu, MD   0.4 mg at 10/04/23 1741   traMADol (ULTRAM) tablet 50 mg  50 mg Oral Q6H PRN Agbata, Tochukwu, MD   50 mg at 10/05/23 0150   traZODone (DESYREL) tablet 25 mg  25 mg Oral QHS PRN Mansy, Jan A, MD   25 mg at 10/03/23 2148     Discharge Medications: Please see discharge summary for a list of discharge medications.  Relevant Imaging Results:  Relevant Lab Results:   Additional  Information    Cherre Blanc, RN

## 2023-10-05 NOTE — TOC Transition Note (Signed)
 Transition of Care [facility]) - Discharge Note   Patient Details  Name: Nicholas Gross MRN: [id number] Date of Birth: Aug 27, 1927  Transition of [facility]) CM/SW Contact:  Elsie Halo, RN Phone Number: 10/05/2023, 10:19 AM   Clinical Narrative:     Patient is medically clear to dc to [facility] for short term rehab. TOC left voicemail with the patients niece, Hillery Lown [phone]  to notify of the discharge. Transportation arranged with [facility].  Nurse to call report to [facility] [phone]   Final next level of care: Skilled Nursing Facility Barriers to Discharge: Insurance Authorization, Continued Medical Work up   Patient Goals and CMS Choice            Discharge Placement              Patient chooses bed at: [facility] Patient to be transferred to facility by: [facility] Name of family member notified: Left voicemail with Sophronia Dutton Patient and family notified of of transfer: 10/05/23  Discharge Plan and Services Additional resources added to the After Visit Summary for     Discharge Planning Services: CM Consult                                 Social Drivers of Health (SDOH) Interventions SDOH Screenings   Food Insecurity: Patient Declined (09/29/2023)  Housing: Patient Declined (09/29/2023)  Transportation Needs: Patient Declined (09/29/2023)  Utilities: Patient Declined (09/29/2023)  Alcohol Screen: Low Risk  (05/13/2021)  Depression (PHQ2-9): Low Risk  (07/06/2023)  Recent Concern: Depression (PHQ2-9) - Medium Risk (06/01/2023)  Financial Resource Strain: Low Risk  (04/20/2023)   Received from [facility]  Physical Activity: Unknown (05/13/2021)  Social Connections: Patient Declined (09/29/2023)  Stress: No Stress Concern Present (05/13/2021)  Tobacco Use: Medium Risk (09/21/2023)   Received from [facility]     Readmission Risk Interventions     No data to display

## 2023-10-05 NOTE — Progress Notes (Signed)
 I called report to [facility] at [phone] and spoke to Deidre the nurse assuming care. Patient left via [facility] with all belongings with sister at bedside st the time of discharge.

## 2023-10-14 ENCOUNTER — Ambulatory Visit: Admitting: Urology

## 2023-10-28 ENCOUNTER — Other Ambulatory Visit: Payer: Self-pay

## 2023-10-28 ENCOUNTER — Encounter: Payer: Self-pay | Admitting: *Deleted

## 2023-10-28 ENCOUNTER — Emergency Department

## 2023-10-28 ENCOUNTER — Telehealth: Payer: Self-pay

## 2023-10-28 ENCOUNTER — Inpatient Hospital Stay
Admission: EM | Admit: 2023-10-28 | Discharge: 2023-11-05 | DRG: 522 | Disposition: A | Discharge status: Skilled Nursing Facility | Source: Skilled Nursing Facility | Attending: Internal Medicine | Admitting: Internal Medicine

## 2023-10-28 DIAGNOSIS — S72092A Other fracture of head and neck of left femur, initial encounter for closed fracture: Principal | ICD-10-CM | POA: Diagnosis present

## 2023-10-28 DIAGNOSIS — S72002A Fracture of unspecified part of neck of left femur, initial encounter for closed fracture: Secondary | ICD-10-CM | POA: Diagnosis present

## 2023-10-28 DIAGNOSIS — Z8249 Family history of ischemic heart disease and other diseases of the circulatory system: Secondary | ICD-10-CM

## 2023-10-28 DIAGNOSIS — Z7982 Long term (current) use of aspirin: Secondary | ICD-10-CM

## 2023-10-28 DIAGNOSIS — M25552 Pain in left hip: Secondary | ICD-10-CM | POA: Diagnosis not present

## 2023-10-28 DIAGNOSIS — E440 Moderate protein-calorie malnutrition: Secondary | ICD-10-CM | POA: Diagnosis present

## 2023-10-28 DIAGNOSIS — I2510 Atherosclerotic heart disease of native coronary artery without angina pectoris: Secondary | ICD-10-CM | POA: Diagnosis present

## 2023-10-28 DIAGNOSIS — I69351 Hemiplegia and hemiparesis following cerebral infarction affecting right dominant side: Secondary | ICD-10-CM

## 2023-10-28 DIAGNOSIS — N390 Urinary tract infection, site not specified: Secondary | ICD-10-CM | POA: Diagnosis present

## 2023-10-28 DIAGNOSIS — I10 Essential (primary) hypertension: Secondary | ICD-10-CM | POA: Diagnosis present

## 2023-10-28 DIAGNOSIS — Z515 Encounter for palliative care: Secondary | ICD-10-CM

## 2023-10-28 DIAGNOSIS — Z604 Social exclusion and rejection: Secondary | ICD-10-CM | POA: Diagnosis present

## 2023-10-28 DIAGNOSIS — R338 Other retention of urine: Secondary | ICD-10-CM | POA: Diagnosis present

## 2023-10-28 DIAGNOSIS — W050XXA Fall from non-moving wheelchair, initial encounter: Secondary | ICD-10-CM | POA: Diagnosis present

## 2023-10-28 DIAGNOSIS — R296 Repeated falls: Secondary | ICD-10-CM | POA: Diagnosis present

## 2023-10-28 DIAGNOSIS — E785 Hyperlipidemia, unspecified: Secondary | ICD-10-CM | POA: Diagnosis present

## 2023-10-28 DIAGNOSIS — I739 Peripheral vascular disease, unspecified: Secondary | ICD-10-CM | POA: Diagnosis present

## 2023-10-28 DIAGNOSIS — L120 Bullous pemphigoid: Secondary | ICD-10-CM | POA: Diagnosis present

## 2023-10-28 DIAGNOSIS — I69322 Dysarthria following cerebral infarction: Secondary | ICD-10-CM

## 2023-10-28 DIAGNOSIS — Z79899 Other long term (current) drug therapy: Secondary | ICD-10-CM

## 2023-10-28 DIAGNOSIS — Z7189 Other specified counseling: Secondary | ICD-10-CM

## 2023-10-28 DIAGNOSIS — Z87891 Personal history of nicotine dependence: Secondary | ICD-10-CM

## 2023-10-28 DIAGNOSIS — I69320 Aphasia following cerebral infarction: Secondary | ICD-10-CM

## 2023-10-28 DIAGNOSIS — Y92129 Unspecified place in nursing home as the place of occurrence of the external cause: Secondary | ICD-10-CM

## 2023-10-28 DIAGNOSIS — D62 Acute posthemorrhagic anemia: Secondary | ICD-10-CM | POA: Diagnosis not present

## 2023-10-28 DIAGNOSIS — R339 Retention of urine, unspecified: Secondary | ICD-10-CM | POA: Diagnosis present

## 2023-10-28 DIAGNOSIS — Z888 Allergy status to other drugs, medicaments and biological substances status: Secondary | ICD-10-CM

## 2023-10-28 DIAGNOSIS — Z66 Do not resuscitate: Secondary | ICD-10-CM | POA: Diagnosis present

## 2023-10-28 DIAGNOSIS — Z7952 Long term (current) use of systemic steroids: Secondary | ICD-10-CM

## 2023-10-28 DIAGNOSIS — W19XXXA Unspecified fall, initial encounter: Principal | ICD-10-CM | POA: Diagnosis present

## 2023-10-28 DIAGNOSIS — B964 Proteus (mirabilis) (morganii) as the cause of diseases classified elsewhere: Secondary | ICD-10-CM | POA: Diagnosis present

## 2023-10-28 DIAGNOSIS — N401 Enlarged prostate with lower urinary tract symptoms: Secondary | ICD-10-CM | POA: Diagnosis present

## 2023-10-28 NOTE — Telephone Encounter (Signed)
 Copied from CRM [id number]. Topic: Clinical - Medical Advice >> Oct 28, 2023  1:31 PM Dominic Friendly wrote: [phone] Hillery Lown patient's niece would like a call back to discus what steps needs to be taken to have the patient seen by his pcp because he the patient is currently in a long term facility [facility] in [place]

## 2023-10-28 NOTE — ED Triage Notes (Signed)
 Pt brought in via ems from [facility].  Pt states he missed the wheelchair and fell.  Pt has left hip pain   pt alert.

## 2023-10-28 NOTE — Telephone Encounter (Signed)
 Noted, will leave for Dr. Lincoln Renshaw review upon return to office.

## 2023-10-28 NOTE — ED Provider Notes (Signed)
 [facility] Provider Note    Event Date/Time   First MD Initiated Contact with Patient 10/28/23 2327     (approximate)   History   Fall, Migraine, and Hip Pain   HPI  Nicholas Gross is a 89 y.o. male with history of CAD, hypertension, hyperlipidemia, some aphasia at baseline, presenting with left hip pain after fall.  Per EMS patient told them that he missed his wheelchair and fell, was complaining about pain to his left hip.  Per family who is at bedside, states that she was told by the nursing home that he is here, states that when she asked him how he fell, he was not able to give a clear history.  States that he has history of aphasia, states that other than him talking a little more slowly, speech is around his baseline.  Independent history obtained from EMS and family as above.   On independent chart review, patient was admitted in April for altered mental status and a fall, was found to have right pubic rami fractures, was also found to have urinary retention.  Physical Exam   Triage Vital Signs: ED Triage Vitals  Encounter Vitals Group     BP 10/28/23 2059 (!) 161/66     Systolic BP Percentile --      Diastolic BP Percentile --      Pulse Rate 10/28/23 2059 66     Resp 10/28/23 2059 18     Temp 10/28/23 2059 98.6 F (37 C)     Temp Source 10/28/23 2059 Oral     SpO2 10/28/23 2059 100 %     Weight 10/28/23 2100 149 lb 14.6 oz (68 kg)     Height 10/28/23 2100 5\' 8"  (1.727 m)     Head Circumference --      Peak Flow --      Pain Score 10/28/23 2059 8     Pain Loc --      Pain Education --      Exclude from Growth Chart --     Most recent vital signs: Vitals:   10/29/23 0044 10/29/23 0148  BP: (!) 159/66   Pulse: 67   Resp: 18   Temp:  98.6 F (37 C)  SpO2: 100%      General: Awake, no distress.  CV:  Good peripheral perfusion.  Resp:  Normal effort.  No thoracic cage tenderness Abd:  No distention.  Soft  nontender Other:  No palpable skull deformities or tenderness, no facial deformities, no midline spinal tenderness, he does have tenderness to his left hip with reduced range of motion, able to range his bilateral upper extremities as well as right lower extremity, he is able to move his toes bilaterally.  Equal radial pulses bilaterally.  No tenderness to his left knee, leg, ankle, foot.   ED Results / Procedures / Treatments   Labs (all labs ordered are listed, but only abnormal results are displayed) Labs Reviewed  COMPREHENSIVE METABOLIC PANEL WITH GFR - Abnormal; Notable for the following components:      Result Value   Sodium 134 (*)    Potassium 3.0 (*)    BUN 35 (*)    Calcium  8.4 (*)    Total Protein 5.5 (*)    Albumin 3.4 (*)    All other components within normal limits  CBC WITH DIFFERENTIAL/PLATELET - Abnormal; Notable for the following components:   WBC 12.8 (*)    RBC 3.03 (*)  Hemoglobin 10.0 (*)    HCT 29.0 (*)    Neutro Abs 10.6 (*)    Abs Immature Granulocytes 0.08 (*)    All other components within normal limits  PROTIME-INR - Abnormal; Notable for the following components:   Prothrombin Time 15.3 (*)    All other components within normal limits  URINALYSIS, W/ REFLEX TO CULTURE (INFECTION SUSPECTED) - Abnormal; Notable for the following components:   Color, Urine YELLOW (*)    APPearance CLOUDY (*)    Nitrite POSITIVE (*)    Leukocytes,Ua LARGE (*)    Bacteria, UA FEW (*)    All other components within normal limits  URINE CULTURE  TROPONIN I (HIGH SENSITIVITY)  TROPONIN I (HIGH SENSITIVITY)     RADIOLOGY On my independent interpretation, CT head without obvious intracranial hemorrhage   PROCEDURES:  Critical Care performed: No  Procedures   MEDICATIONS ORDERED IN ED: Medications  ceFAZolin (ANCEF) IVPB 2g/100 mL premix (has no administration in time range)  morphine  (PF) 2 MG/ML injection 2 mg (2 mg Intravenous Not Given 10/29/23 0136)   potassium chloride  SA (KLOR-CON  M) CR tablet 40 mEq (40 mEq Oral Patient Refused/Not Given 10/29/23 0136)  morphine  (PF) 2 MG/ML injection 2 mg (2 mg Intravenous Given 10/29/23 0044)     IMPRESSION / MDM / ASSESSMENT AND PLAN / ED COURSE  I reviewed the triage vital signs and the nursing notes.                              Differential diagnosis includes, but is not limited to, fall, consider intracranial hemorrhage, fracture, contusion, strain, sprain, patient had reported earlier that he fell of his wheelchair, not able to remember what happened, consider metabolic encephalopathy, sundowning, occult infection, electrolyte derangements, will get labs, EKG, troponin, UA, chest x-ray.  Patient's presentation is most consistent with acute presentation with potential threat to life or bodily function.  Independent interpretation of labs and imaging below.  Given the fracture, he will need to be admitted for Ortho evaluation as well as pain control.  Consult hospitalist who is agreeable with plan for admission and will evaluate the patient.  He is admitted    Clinical Course as of 10/29/23 0242  Wed Oct 28, 2023  2340 DG Hip Unilat W or Wo Pelvis 2-3 Views Left IMPRESSION: 1. Acute nondisplaced fracture through the left femoral neck. 2. Right superior and inferior pubic rami fractures are again seen.   [TT]  2341 DG Chest 1 View No active disease.  [TT]  Thu Oct 29, 2023  0019 Consulted orthopedic surgery who evaluated images, recommended admission, will evaluate him tomorrow. [TT]  0034 Was notified by nurse that morphine  was accidentally wasted when she was pushing the plunger, the plunger fell out and the morphine  was wasted on the bed. [TT]  0034 Independent review of labs, no leukocytosis, his potassium is low, will replete, creatinine is normal [TT]  0108 Postvoid residual is 203 cc, will insert Foley.  He does have history of urinary retention. [TT]  0127 CT Head Wo Contrast No  acute intracranial or cervical spine finding.  [TT]  0128 CT Cervical Spine Wo Contrast No acute intracranial or cervical spine finding.  [TT]    Clinical Course User Index [TT] Drenda Gentle Richard Champion, MD     FINAL CLINICAL IMPRESSION(S) / ED DIAGNOSES   Final diagnoses:  Fall, initial encounter  Closed fracture of neck of left femur,  initial encounter [facility])  Urinary retention     Rx / DC Orders   ED Discharge Orders     None        Note:  This document was prepared using Dragon voice recognition software and may include unintentional dictation errors.    Shane Darling, MD 10/29/23 [phone]

## 2023-10-28 NOTE — Telephone Encounter (Signed)
 Returned call to niece. She is concerned that since patients discharge into Long Term care that the providers there feel like patients mental capacity is severely declined. Tammy feels that since patient is very new to the facility that they do not know his baseline in order to make a good judgement where he is at right now. Patient is scheduled for 11/03/2023 to see PCP. Tammy will be with him for visit.

## 2023-10-29 ENCOUNTER — Inpatient Hospital Stay: Admitting: Anesthesiology

## 2023-10-29 ENCOUNTER — Inpatient Hospital Stay

## 2023-10-29 ENCOUNTER — Emergency Department

## 2023-10-29 ENCOUNTER — Encounter
Admission: EM | Disposition: A | Discharge status: Skilled Nursing Facility | Payer: Self-pay | Source: Skilled Nursing Facility | Attending: Internal Medicine

## 2023-10-29 ENCOUNTER — Other Ambulatory Visit: Payer: Self-pay

## 2023-10-29 ENCOUNTER — Encounter: Payer: Self-pay | Admitting: Internal Medicine

## 2023-10-29 DIAGNOSIS — S72092A Other fracture of head and neck of left femur, initial encounter for closed fracture: Secondary | ICD-10-CM | POA: Diagnosis present

## 2023-10-29 DIAGNOSIS — N390 Urinary tract infection, site not specified: Secondary | ICD-10-CM | POA: Diagnosis present

## 2023-10-29 DIAGNOSIS — Z79899 Other long term (current) drug therapy: Secondary | ICD-10-CM | POA: Diagnosis not present

## 2023-10-29 DIAGNOSIS — S72002A Fracture of unspecified part of neck of left femur, initial encounter for closed fracture: Secondary | ICD-10-CM

## 2023-10-29 DIAGNOSIS — Z66 Do not resuscitate: Secondary | ICD-10-CM | POA: Diagnosis present

## 2023-10-29 DIAGNOSIS — Z8249 Family history of ischemic heart disease and other diseases of the circulatory system: Secondary | ICD-10-CM | POA: Diagnosis not present

## 2023-10-29 DIAGNOSIS — Z87891 Personal history of nicotine dependence: Secondary | ICD-10-CM | POA: Diagnosis not present

## 2023-10-29 DIAGNOSIS — L120 Bullous pemphigoid: Secondary | ICD-10-CM | POA: Diagnosis present

## 2023-10-29 DIAGNOSIS — I10 Essential (primary) hypertension: Secondary | ICD-10-CM | POA: Diagnosis present

## 2023-10-29 DIAGNOSIS — D62 Acute posthemorrhagic anemia: Secondary | ICD-10-CM | POA: Diagnosis not present

## 2023-10-29 DIAGNOSIS — I2510 Atherosclerotic heart disease of native coronary artery without angina pectoris: Secondary | ICD-10-CM | POA: Diagnosis present

## 2023-10-29 DIAGNOSIS — M25552 Pain in left hip: Secondary | ICD-10-CM | POA: Diagnosis present

## 2023-10-29 DIAGNOSIS — W19XXXA Unspecified fall, initial encounter: Secondary | ICD-10-CM

## 2023-10-29 DIAGNOSIS — N401 Enlarged prostate with lower urinary tract symptoms: Secondary | ICD-10-CM | POA: Diagnosis present

## 2023-10-29 DIAGNOSIS — B964 Proteus (mirabilis) (morganii) as the cause of diseases classified elsewhere: Secondary | ICD-10-CM | POA: Diagnosis present

## 2023-10-29 DIAGNOSIS — I69322 Dysarthria following cerebral infarction: Secondary | ICD-10-CM

## 2023-10-29 DIAGNOSIS — R338 Other retention of urine: Secondary | ICD-10-CM | POA: Diagnosis present

## 2023-10-29 DIAGNOSIS — E785 Hyperlipidemia, unspecified: Secondary | ICD-10-CM | POA: Diagnosis present

## 2023-10-29 DIAGNOSIS — E440 Moderate protein-calorie malnutrition: Secondary | ICD-10-CM | POA: Diagnosis present

## 2023-10-29 DIAGNOSIS — R339 Retention of urine, unspecified: Secondary | ICD-10-CM | POA: Diagnosis not present

## 2023-10-29 DIAGNOSIS — Z604 Social exclusion and rejection: Secondary | ICD-10-CM | POA: Diagnosis present

## 2023-10-29 DIAGNOSIS — I739 Peripheral vascular disease, unspecified: Secondary | ICD-10-CM | POA: Diagnosis present

## 2023-10-29 DIAGNOSIS — I69351 Hemiplegia and hemiparesis following cerebral infarction affecting right dominant side: Secondary | ICD-10-CM | POA: Diagnosis not present

## 2023-10-29 DIAGNOSIS — W050XXA Fall from non-moving wheelchair, initial encounter: Secondary | ICD-10-CM | POA: Diagnosis present

## 2023-10-29 DIAGNOSIS — Z7982 Long term (current) use of aspirin: Secondary | ICD-10-CM | POA: Diagnosis not present

## 2023-10-29 DIAGNOSIS — Y92129 Unspecified place in nursing home as the place of occurrence of the external cause: Secondary | ICD-10-CM | POA: Diagnosis not present

## 2023-10-29 DIAGNOSIS — Z7189 Other specified counseling: Secondary | ICD-10-CM | POA: Diagnosis not present

## 2023-10-29 DIAGNOSIS — R296 Repeated falls: Secondary | ICD-10-CM | POA: Diagnosis present

## 2023-10-29 DIAGNOSIS — I25118 Atherosclerotic heart disease of native coronary artery with other forms of angina pectoris: Secondary | ICD-10-CM | POA: Diagnosis not present

## 2023-10-29 DIAGNOSIS — Z515 Encounter for palliative care: Secondary | ICD-10-CM | POA: Diagnosis not present

## 2023-10-29 DIAGNOSIS — Z7952 Long term (current) use of systemic steroids: Secondary | ICD-10-CM | POA: Diagnosis not present

## 2023-10-29 DIAGNOSIS — I69320 Aphasia following cerebral infarction: Secondary | ICD-10-CM | POA: Diagnosis not present

## 2023-10-29 DIAGNOSIS — N3000 Acute cystitis without hematuria: Secondary | ICD-10-CM | POA: Diagnosis not present

## 2023-10-29 HISTORY — PX: HIP ARTHROPLASTY: SHX981

## 2023-10-29 LAB — CBC WITH DIFFERENTIAL/PLATELET
Abs Immature Granulocytes: 0.08 10*3/uL — ABNORMAL HIGH (ref 0.00–0.07)
Basophils Absolute: 0 10*3/uL (ref 0.0–0.1)
Basophils Relative: 0 %
Eosinophils Absolute: 0.3 10*3/uL (ref 0.0–0.5)
Eosinophils Relative: 2 %
HCT: 29 % — ABNORMAL LOW (ref 39.0–52.0)
Hemoglobin: 10 g/dL — ABNORMAL LOW (ref 13.0–17.0)
Immature Granulocytes: 1 %
Lymphocytes Relative: 8 %
Lymphs Abs: 1 10*3/uL (ref 0.7–4.0)
MCH: 33 pg (ref 26.0–34.0)
MCHC: 34.5 g/dL (ref 30.0–36.0)
MCV: 95.7 fL (ref 80.0–100.0)
Monocytes Absolute: 0.8 10*3/uL (ref 0.1–1.0)
Monocytes Relative: 6 %
Neutro Abs: 10.6 10*3/uL — ABNORMAL HIGH (ref 1.7–7.7)
Neutrophils Relative %: 83 %
Platelets: 180 10*3/uL (ref 150–400)
RBC: 3.03 MIL/uL — ABNORMAL LOW (ref 4.22–5.81)
RDW: 13.2 % (ref 11.5–15.5)
WBC: 12.8 10*3/uL — ABNORMAL HIGH (ref 4.0–10.5)
nRBC: 0 % (ref 0.0–0.2)

## 2023-10-29 LAB — PROTIME-INR
INR: 1.2 (ref 0.8–1.2)
Prothrombin Time: 15.3 s — ABNORMAL HIGH (ref 11.4–15.2)

## 2023-10-29 LAB — COMPREHENSIVE METABOLIC PANEL WITH GFR
ALT: 26 U/L (ref 0–44)
AST: 20 U/L (ref 15–41)
Albumin: 3.4 g/dL — ABNORMAL LOW (ref 3.5–5.0)
Alkaline Phosphatase: 109 U/L (ref 38–126)
Anion gap: 10 (ref 5–15)
BUN: 35 mg/dL — ABNORMAL HIGH (ref 8–23)
CO2: 26 mmol/L (ref 22–32)
Calcium: 8.4 mg/dL — ABNORMAL LOW (ref 8.9–10.3)
Chloride: 98 mmol/L (ref 98–111)
Creatinine, Ser: 0.88 mg/dL (ref 0.61–1.24)
GFR, Estimated: >60 mL/min (ref >60)
Glucose, Bld: 94 mg/dL (ref 70–99)
Potassium: 3 mmol/L — ABNORMAL LOW (ref 3.5–5.1)
Sodium: 134 mmol/L — ABNORMAL LOW (ref 135–145)
Total Bilirubin: 1.1 mg/dL (ref 0.0–1.2)
Total Protein: 5.5 g/dL — ABNORMAL LOW (ref 6.5–8.1)

## 2023-10-29 LAB — URINALYSIS, W/ REFLEX TO CULTURE (INFECTION SUSPECTED)
Bilirubin Urine: NEGATIVE
Glucose, UA: NEGATIVE mg/dL
Hgb urine dipstick: NEGATIVE
Ketones, ur: NEGATIVE mg/dL
Nitrite: POSITIVE — AB
Protein, ur: NEGATIVE mg/dL
Specific Gravity, Urine: 1.015 (ref 1.005–1.030)
pH: 8 (ref 5.0–8.0)

## 2023-10-29 LAB — TROPONIN I (HIGH SENSITIVITY)
Troponin I (High Sensitivity): 12 ng/L (ref <18)
Troponin I (High Sensitivity): 13 ng/L (ref <18)

## 2023-10-29 LAB — TYPE AND SCREEN
ABO/RH(D): A NEG
Antibody Screen: NEGATIVE

## 2023-10-29 SURGERY — HEMIARTHROPLASTY (BIPOLAR) HIP, POSTERIOR APPROACH FOR FRACTURE
Anesthesia: General | Laterality: Left

## 2023-10-29 MED ORDER — LACTATED RINGERS IV SOLN: INTRAVENOUS | Status: DC | PRN

## 2023-10-29 MED ORDER — PHENYLEPHRINE HCL-NACL 20-0.9 MG/250ML-% IV SOLN
INTRAVENOUS | Status: DC | PRN
  Administered 2023-10-29: 25 ug/min via INTRAVENOUS

## 2023-10-29 MED ORDER — ENOXAPARIN SODIUM 40 MG/0.4ML IJ SOSY
40.0000 mg | PREFILLED_SYRINGE | INTRAMUSCULAR | Status: DC
  Administered 2023-10-30 – 2023-11-05 (×7): 40 mg via SUBCUTANEOUS
  Filled 2023-10-29 (×7): qty 0.4

## 2023-10-29 MED ORDER — METOCLOPRAMIDE HCL 5 MG/ML IJ SOLN: 5.0000 mg | Freq: Three times a day (TID) | INTRAMUSCULAR | Status: DC | PRN

## 2023-10-29 MED ORDER — POTASSIUM CHLORIDE CRYS ER 20 MEQ PO TBCR
20.0000 meq | EXTENDED_RELEASE_TABLET | Freq: Every day | ORAL | Status: DC
  Administered 2023-10-29 – 2023-11-05 (×8): 20 meq via ORAL
  Filled 2023-10-29 (×8): qty 1

## 2023-10-29 MED ORDER — SUGAMMADEX SODIUM 200 MG/2ML IV SOLN
INTRAVENOUS | Status: DC | PRN
  Administered 2023-10-29: 200 mg via INTRAVENOUS

## 2023-10-29 MED ORDER — TRANEXAMIC ACID-NACL 1000-0.7 MG/100ML-% IV SOLN
INTRAVENOUS | Status: AC
  Filled 2023-10-29: qty 100

## 2023-10-29 MED ORDER — FENTANYL CITRATE (PF) 100 MCG/2ML IJ SOLN: 25.0000 ug | INTRAMUSCULAR | Status: DC | PRN

## 2023-10-29 MED ORDER — ROCURONIUM BROMIDE 100 MG/10ML IV SOLN
INTRAVENOUS | Status: DC | PRN
  Administered 2023-10-29: 10 mg via INTRAVENOUS
  Administered 2023-10-29: 40 mg via INTRAVENOUS

## 2023-10-29 MED ORDER — DEXAMETHASONE SODIUM PHOSPHATE 10 MG/ML IJ SOLN
INTRAMUSCULAR | Status: AC
  Filled 2023-10-29: qty 1

## 2023-10-29 MED ORDER — PHENYLEPHRINE HCL (PRESSORS) 10 MG/ML IV SOLN
INTRAVENOUS | Status: AC
  Filled 2023-10-29: qty 1

## 2023-10-29 MED ORDER — EPHEDRINE 5 MG/ML INJ
INTRAVENOUS | Status: AC
  Filled 2023-10-29: qty 5

## 2023-10-29 MED ORDER — ACETAMINOPHEN 325 MG PO TABS
325.0000 mg | ORAL_TABLET | Freq: Four times a day (QID) | ORAL | Status: DC | PRN
  Administered 2023-10-31 – 2023-11-03 (×7): 650 mg via ORAL
  Filled 2023-10-29 (×7): qty 2

## 2023-10-29 MED ORDER — BUPIVACAINE-EPINEPHRINE (PF) 0.5% -1:200000 IJ SOLN
INTRAMUSCULAR | Status: DC | PRN
  Administered 2023-10-29: 30 mL

## 2023-10-29 MED ORDER — BUPIVACAINE LIPOSOME 1.3 % IJ SUSP
INTRAMUSCULAR | Status: DC | PRN
  Administered 2023-10-29: 20 mL

## 2023-10-29 MED ORDER — LISINOPRIL 5 MG PO TABS
5.0000 mg | ORAL_TABLET | Freq: Every day | ORAL | Status: DC
  Administered 2023-10-30 – 2023-11-05 (×7): 5 mg via ORAL
  Filled 2023-10-29 (×8): qty 1

## 2023-10-29 MED ORDER — ACETAMINOPHEN 10 MG/ML IV SOLN
INTRAVENOUS | Status: DC | PRN
Start: 2023-10-29 — End: 2023-10-29
  Administered 2023-10-29: 1000 mg via INTRAVENOUS

## 2023-10-29 MED ORDER — TRANEXAMIC ACID 1000 MG/10ML IV SOLN
INTRAVENOUS | Status: DC | PRN
  Administered 2023-10-29: 1000 mg via INTRAVENOUS

## 2023-10-29 MED ORDER — ACETAMINOPHEN 500 MG PO TABS
500.0000 mg | ORAL_TABLET | Freq: Four times a day (QID) | ORAL | Status: AC
  Administered 2023-10-30 (×3): 500 mg via ORAL
  Filled 2023-10-29 (×3): qty 1

## 2023-10-29 MED ORDER — TAMSULOSIN HCL 0.4 MG PO CAPS
0.4000 mg | ORAL_CAPSULE | Freq: Every day | ORAL | Status: DC
  Administered 2023-10-30 – 2023-11-04 (×6): 0.4 mg via ORAL
  Filled 2023-10-29 (×6): qty 1

## 2023-10-29 MED ORDER — ROSUVASTATIN CALCIUM 10 MG PO TABS
10.0000 mg | ORAL_TABLET | Freq: Every day | ORAL | Status: DC
  Administered 2023-10-30 – 2023-11-05 (×7): 10 mg via ORAL
  Filled 2023-10-29 (×8): qty 1

## 2023-10-29 MED ORDER — BISACODYL 10 MG RE SUPP: 10.0000 mg | Freq: Every day | RECTAL | Status: DC | PRN

## 2023-10-29 MED ORDER — LIDOCAINE HCL (PF) 2 % IJ SOLN
INTRAMUSCULAR | Status: AC
  Filled 2023-10-29: qty 5

## 2023-10-29 MED ORDER — KETAMINE HCL 10 MG/ML IJ SOLN
INTRAMUSCULAR | Status: DC | PRN
Start: 2023-10-29 — End: 2023-10-29
  Administered 2023-10-29: 10 mg via INTRAVENOUS
  Administered 2023-10-29: 20 mg via INTRAVENOUS

## 2023-10-29 MED ORDER — ONDANSETRON HCL 4 MG PO TABS: 4.0000 mg | ORAL_TABLET | Freq: Four times a day (QID) | ORAL | Status: DC | PRN

## 2023-10-29 MED ORDER — MAGNESIUM HYDROXIDE 400 MG/5ML PO SUSP: 30.0000 mL | Freq: Every day | ORAL | Status: DC | PRN

## 2023-10-29 MED ORDER — PREDNISONE 20 MG PO TABS
20.0000 mg | ORAL_TABLET | Freq: Every day | ORAL | Status: DC
  Administered 2023-10-30 – 2023-11-05 (×7): 20 mg via ORAL
  Filled 2023-10-29 (×8): qty 1

## 2023-10-29 MED ORDER — DROPERIDOL 2.5 MG/ML IJ SOLN: 0.6250 mg | Freq: Once | INTRAMUSCULAR | Status: DC | PRN

## 2023-10-29 MED ORDER — SODIUM CHLORIDE (PF) 0.9 % IJ SOLN
INTRAMUSCULAR | Status: AC
  Filled 2023-10-29: qty 10

## 2023-10-29 MED ORDER — SODIUM CHLORIDE 0.9 % IV SOLN: INTRAVENOUS | Status: AC

## 2023-10-29 MED ORDER — DIPHENHYDRAMINE HCL 12.5 MG/5ML PO ELIX
12.5000 mg | ORAL_SOLUTION | ORAL | Status: DC | PRN
  Administered 2023-11-02 – 2023-11-03 (×3): 12.5 mg via ORAL
  Filled 2023-10-29 (×3): qty 5

## 2023-10-29 MED ORDER — DEXAMETHASONE SODIUM PHOSPHATE 10 MG/ML IJ SOLN
INTRAMUSCULAR | Status: DC | PRN
  Administered 2023-10-29: 5 mg via INTRAVENOUS

## 2023-10-29 MED ORDER — SODIUM CHLORIDE 0.9% FLUSH
INTRAVENOUS | Status: DC | PRN
  Administered 2023-10-29: 10 mL

## 2023-10-29 MED ORDER — METHOCARBAMOL 1000 MG/10ML IJ SOLN: 500.0000 mg | Freq: Four times a day (QID) | INTRAMUSCULAR | Status: DC | PRN

## 2023-10-29 MED ORDER — LIDOCAINE HCL (CARDIAC) PF 100 MG/5ML IV SOSY
PREFILLED_SYRINGE | INTRAVENOUS | Status: DC | PRN
  Administered 2023-10-29: 60 mg via INTRAVENOUS

## 2023-10-29 MED ORDER — DOCUSATE SODIUM 100 MG PO CAPS
100.0000 mg | ORAL_CAPSULE | Freq: Two times a day (BID) | ORAL | Status: DC
  Administered 2023-10-30 – 2023-10-31 (×3): 100 mg via ORAL
  Filled 2023-10-29 (×4): qty 1

## 2023-10-29 MED ORDER — ROCURONIUM BROMIDE 10 MG/ML (PF) SYRINGE
PREFILLED_SYRINGE | INTRAVENOUS | Status: AC
  Filled 2023-10-29: qty 10

## 2023-10-29 MED ORDER — METHOCARBAMOL 500 MG PO TABS
500.0000 mg | ORAL_TABLET | Freq: Four times a day (QID) | ORAL | Status: DC | PRN
  Administered 2023-10-31 – 2023-11-02 (×5): 500 mg via ORAL
  Filled 2023-10-29 (×5): qty 1

## 2023-10-29 MED ORDER — ONDANSETRON HCL 4 MG/2ML IJ SOLN
INTRAMUSCULAR | Status: DC | PRN
  Administered 2023-10-29: 4 mg via INTRAVENOUS

## 2023-10-29 MED ORDER — BUPIVACAINE LIPOSOME 1.3 % IJ SUSP
INTRAMUSCULAR | Status: AC
  Filled 2023-10-29: qty 20

## 2023-10-29 MED ORDER — HYDROCODONE-ACETAMINOPHEN 5-325 MG PO TABS
1.0000 | ORAL_TABLET | Freq: Four times a day (QID) | ORAL | Status: DC | PRN
  Administered 2023-10-30 (×2): 1 via ORAL
  Filled 2023-10-29 (×2): qty 1

## 2023-10-29 MED ORDER — KETOROLAC TROMETHAMINE 30 MG/ML IJ SOLN
INTRAMUSCULAR | Status: DC | PRN
  Administered 2023-10-29: 15 mg via INTRAMUSCULAR

## 2023-10-29 MED ORDER — TRIAMCINOLONE ACETONIDE 40 MG/ML IJ SUSP
INTRAMUSCULAR | Status: AC
  Filled 2023-10-29: qty 2

## 2023-10-29 MED ORDER — SODIUM CHLORIDE 0.9 % IV SOLN
1.0000 g | INTRAVENOUS | Status: DC
  Administered 2023-10-29 – 2023-11-02 (×5): 1 g via INTRAVENOUS
  Filled 2023-10-29 (×5): qty 10

## 2023-10-29 MED ORDER — MORPHINE SULFATE (PF) 2 MG/ML IV SOLN
1.0000 mg | INTRAVENOUS | Status: DC | PRN
  Administered 2023-10-29 (×2): 1 mg via INTRAVENOUS
  Filled 2023-10-29 (×2): qty 1

## 2023-10-29 MED ORDER — SODIUM CHLORIDE 0.9 % IR SOLN
Status: DC | PRN
  Administered 2023-10-29: 3000 mL

## 2023-10-29 MED ORDER — ASPIRIN 81 MG PO TBEC
81.0000 mg | DELAYED_RELEASE_TABLET | Freq: Every day | ORAL | Status: DC
  Filled 2023-10-29: qty 1

## 2023-10-29 MED ORDER — ACETAMINOPHEN 10 MG/ML IV SOLN
INTRAVENOUS | Status: AC
Start: 2023-10-29 — End: ?
  Filled 2023-10-29: qty 100

## 2023-10-29 MED ORDER — CEFAZOLIN SODIUM-DEXTROSE 2-4 GM/100ML-% IV SOLN
2.0000 g | INTRAVENOUS | Status: DC
  Filled 2023-10-29: qty 100

## 2023-10-29 MED ORDER — MORPHINE SULFATE (PF) 2 MG/ML IV SOLN
2.0000 mg | Freq: Once | INTRAVENOUS | Status: DC
  Filled 2023-10-29: qty 1

## 2023-10-29 MED ORDER — CEFAZOLIN SODIUM-DEXTROSE 2-4 GM/100ML-% IV SOLN
2.0000 g | Freq: Four times a day (QID) | INTRAVENOUS | Status: AC
  Administered 2023-10-29 – 2023-10-30 (×2): 2 g via INTRAVENOUS
  Filled 2023-10-29 (×2): qty 100

## 2023-10-29 MED ORDER — SODIUM CHLORIDE 0.9 % IV SOLN
INTRAVENOUS | Status: DC | PRN
  Administered 2023-10-29: .2 mL via TOPICAL

## 2023-10-29 MED ORDER — PHENYLEPHRINE 80 MCG/ML (10ML) SYRINGE FOR IV PUSH (FOR BLOOD PRESSURE SUPPORT)
PREFILLED_SYRINGE | INTRAVENOUS | Status: DC | PRN
  Administered 2023-10-29 (×7): 80 ug via INTRAVENOUS

## 2023-10-29 MED ORDER — CEFAZOLIN SODIUM-DEXTROSE 2-3 GM-%(50ML) IV SOLR
INTRAVENOUS | Status: DC | PRN
Start: 2023-10-29 — End: 2023-10-29
  Administered 2023-10-29: 2 g via INTRAVENOUS

## 2023-10-29 MED ORDER — POTASSIUM CHLORIDE CRYS ER 20 MEQ PO TBCR
40.0000 meq | EXTENDED_RELEASE_TABLET | Freq: Once | ORAL | Status: DC
  Filled 2023-10-29: qty 2

## 2023-10-29 MED ORDER — ONDANSETRON HCL 4 MG/2ML IJ SOLN
4.0000 mg | Freq: Four times a day (QID) | INTRAMUSCULAR | Status: DC | PRN
Start: 2023-10-29 — End: 2023-11-05

## 2023-10-29 MED ORDER — CEFAZOLIN SODIUM-DEXTROSE 2-4 GM/100ML-% IV SOLN
INTRAVENOUS | Status: AC
  Filled 2023-10-29: qty 100

## 2023-10-29 MED ORDER — FLEET ENEMA RE ENEM: 1.0000 | ENEMA | Freq: Once | RECTAL | Status: DC | PRN

## 2023-10-29 MED ORDER — FENTANYL CITRATE (PF) 100 MCG/2ML IJ SOLN
INTRAMUSCULAR | Status: AC
  Filled 2023-10-29: qty 2

## 2023-10-29 MED ORDER — DOXYCYCLINE HYCLATE 100 MG PO TABS
100.0000 mg | ORAL_TABLET | Freq: Two times a day (BID) | ORAL | Status: DC
  Administered 2023-10-29 – 2023-11-05 (×14): 100 mg via ORAL
  Filled 2023-10-29 (×16): qty 1

## 2023-10-29 MED ORDER — FENTANYL CITRATE (PF) 100 MCG/2ML IJ SOLN
INTRAMUSCULAR | Status: DC | PRN
  Administered 2023-10-29: 50 ug via INTRAVENOUS
  Administered 2023-10-29 (×2): 25 ug via INTRAVENOUS

## 2023-10-29 MED ORDER — MORPHINE SULFATE (PF) 2 MG/ML IV SOLN
2.0000 mg | Freq: Once | INTRAVENOUS | Status: AC
  Administered 2023-10-29: 2 mg via INTRAVENOUS
  Filled 2023-10-29: qty 1

## 2023-10-29 MED ORDER — BUPIVACAINE-EPINEPHRINE (PF) 0.5% -1:200000 IJ SOLN
INTRAMUSCULAR | Status: AC
  Filled 2023-10-29: qty 30

## 2023-10-29 MED ORDER — TRIAMCINOLONE ACETONIDE 40 MG/ML IJ SUSP
INTRAMUSCULAR | Status: DC | PRN
  Administered 2023-10-29: 80 mg via INTRAMUSCULAR

## 2023-10-29 MED ORDER — PROPOFOL 10 MG/ML IV BOLUS
INTRAVENOUS | Status: DC | PRN
Start: 2023-10-29 — End: 2023-10-29
  Administered 2023-10-29: 50 mg via INTRAVENOUS

## 2023-10-29 MED ORDER — METOCLOPRAMIDE HCL 5 MG PO TABS: 5.0000 mg | ORAL_TABLET | Freq: Three times a day (TID) | ORAL | Status: DC | PRN

## 2023-10-29 MED ORDER — KETAMINE HCL 50 MG/5ML IJ SOSY
PREFILLED_SYRINGE | INTRAMUSCULAR | Status: AC
Start: 2023-10-29 — End: ?
  Filled 2023-10-29: qty 5

## 2023-10-29 SURGICAL SUPPLY — 58 items
BAG DECANTER FOR FLEXI CONT (MISCELLANEOUS) IMPLANT
BIT DRILL JC 5IN 3.2M 127 42FL (BIT) IMPLANT
BLADE SAGITTAL WIDE XTHICK NO (BLADE) ×1 IMPLANT
BLADE SURG SZ20 CARB STEEL (BLADE) ×1 IMPLANT
BOWL CEMENT MIXING ADV NOZZLE (MISCELLANEOUS) IMPLANT
CEMENT BONE 40GM (Cement) IMPLANT
CHLORAPREP W/TINT 26 (MISCELLANEOUS) ×2 IMPLANT
DRAPE IMP U-DRAPE 54X76 (DRAPES) ×1 IMPLANT
DRAPE INCISE IOBAN 66X60 STRL (DRAPES) ×1 IMPLANT
DRAPE SURG 17X11 SM STRL (DRAPES) ×1 IMPLANT
DRAPE SURG 17X23 STRL (DRAPES) ×1 IMPLANT
DRSG OPSITE POSTOP 4X12 (GAUZE/BANDAGES/DRESSINGS) IMPLANT
DRSG OPSITE POSTOP 4X8 (GAUZE/BANDAGES/DRESSINGS) IMPLANT
ELECT BLADE 6.5 EXT (BLADE) IMPLANT
ELECT CAUTERY BLADE 6.4 (BLADE) ×1 IMPLANT
ELECTRODE REM PT RTRN 9FT ADLT (ELECTROSURGICAL) ×1 IMPLANT
GAUZE PACK 2X3YD (PACKING) IMPLANT
GAUZE XEROFORM 1X8 LF (GAUZE/BANDAGES/DRESSINGS) ×1 IMPLANT
GLOVE BIO SURGEON STRL SZ7.5 (GLOVE) IMPLANT
GLOVE BIO SURGEON STRL SZ8 (GLOVE) ×3 IMPLANT
GLOVE BIOGEL PI IND STRL 8 (GLOVE) ×1 IMPLANT
GOWN STRL REUS W/ TWL LRG LVL3 (GOWN DISPOSABLE) ×1 IMPLANT
GOWN STRL REUS W/ TWL XL LVL3 (GOWN DISPOSABLE) ×1 IMPLANT
HEAD MOD COCR 28MM HD -3MM NK (Orthopedic Implant) IMPLANT
HOOD PEEL AWAY T7 (MISCELLANEOUS) ×3 IMPLANT
IV NS 100ML SINGLE PACK (IV SOLUTION) IMPLANT
KIT PREP HIP W/CEMENT RESTRICT (Miscellaneous) IMPLANT
LABEL OR SOLS (LABEL) ×1 IMPLANT
MANIFOLD NEPTUNE II (INSTRUMENTS) ×1 IMPLANT
NDL FILTER BLUNT 18X1 1/2 (NEEDLE) ×1 IMPLANT
NDL SAFETY ECLIPSE 18X1.5 (NEEDLE) ×1 IMPLANT
NDL SPNL 20GX3.5 QUINCKE YW (NEEDLE) ×1 IMPLANT
NEEDLE FILTER BLUNT 18X1 1/2 (NEEDLE) IMPLANT
NEEDLE SPNL 20GX3.5 QUINCKE YW (NEEDLE) ×1 IMPLANT
NS IRRIG 500ML POUR BTL (IV SOLUTION) ×1 IMPLANT
PACK HIP PROSTHESIS (MISCELLANEOUS) ×1 IMPLANT
PIN STEIN FIX 2.4X229 SNGL (WIRE) IMPLANT
PIN STEIN SMOOTH 3/16X9 (Pin) ×1 IMPLANT
PIN STEIN SMOOTH 4.8X9 (Pin) ×1 IMPLANT
SHELL RINGBLOC BI POLR 28X56MM (Orthopedic Implant) IMPLANT
SOL .9 NS 3000ML IRR UROMATIC (IV SOLUTION) ×2 IMPLANT
SPIKE FLUID TRANSFER (MISCELLANEOUS) ×2 IMPLANT
SPONGE T-LAP 18X18 ~~LOC~~+RFID (SPONGE) ×2 IMPLANT
STAPLER SKIN PROX 35W (STAPLE) ×1 IMPLANT
STEM CENTRALIZER DISTAL SZ15 (Orthopedic Implant) IMPLANT
STEM FEM ECHO SZ13 150M T1 (Stem) IMPLANT
STRAP SAFETY 5IN WIDE (MISCELLANEOUS) ×1 IMPLANT
SUT TICRON 2-0 30IN 311381 (SUTURE) ×4 IMPLANT
SUT VIC AB 0 CT1 36 (SUTURE) ×1 IMPLANT
SUT VIC AB 1 CT1 36 (SUTURE) ×1 IMPLANT
SUT VIC AB 2-0 CT1 (SUTURE) ×2 IMPLANT
SYR 10ML LL (SYRINGE) ×1 IMPLANT
SYR 30ML LL (SYRINGE) ×3 IMPLANT
SYR TB 1ML LL NO SAFETY (SYRINGE) IMPLANT
TIP BRUSH PULSAVAC PLUS 24.33 (MISCELLANEOUS) IMPLANT
TIP FAN IRRIG PULSAVAC PLUS (DISPOSABLE) ×1 IMPLANT
TRAP FLUID SMOKE EVACUATOR (MISCELLANEOUS) ×2 IMPLANT
WATER STERILE IRR 1000ML POUR (IV SOLUTION) ×1 IMPLANT

## 2023-10-29 NOTE — ED Notes (Signed)
 Patient transported to CT

## 2023-10-29 NOTE — Progress Notes (Signed)
 Pt from [facility] sleeping/drowsy, family at bedside; vital stable; IV antibiotics stated, dissolved potassium med with orange juice pt was a bit awake to drink and went back to sleep.continue to monitor.

## 2023-10-29 NOTE — Anesthesia Procedure Notes (Signed)
 Procedure Name: Intubation Date/Time: 10/29/2023 4:06 PM  Performed by: Philippe Brazen, CRNAPre-anesthesia Checklist: Patient identified, Emergency Drugs available, Suction available and Patient being monitored Patient Re-evaluated:Patient Re-evaluated prior to induction Oxygen Delivery Method: Circle system utilized Preoxygenation: Pre-oxygenation with 100% oxygen Induction Type: IV induction Ventilation: Mask ventilation without difficulty Laryngoscope Size: McGrath and 4 Grade View: Grade I Tube type: Oral Tube size: 7.5 mm Number of attempts: 1 Airway Equipment and Method: Stylet and Oral airway Placement Confirmation: ETT inserted through vocal cords under direct vision, positive ETCO2 and breath sounds checked- equal and bilateral Secured at: 23 cm Tube secured with: Tape Dental Injury: Teeth and Oropharynx as per pre-operative assessment

## 2023-10-29 NOTE — Op Note (Signed)
 10/29/2023  6:09 PM  Patient:   Nicholas Gross  Pre-Op Diagnosis:   Displaced femoral neck fracture, left hip.  Post-Op Diagnosis:   Same.  Procedure:   Left hip bipolar hemiarthroplasty.  Surgeon:   Lonnie Roberts, MD  Assistant:   Edie Goon, PA-C  Anesthesia:   GET  Findings:   As above.  Complications:   None  EBL:   150 cc  Fluids:   500 cc crystalloid  UOP:   150 cc  TT:   None  Drains:   None  Closure:   Staples  Implants:   Biomet Echo system with a #13 laterally offset cemented femoral stem, a 56 mm outer diameter shell, and a 28 mm head with a -3 mm neck adapter.  Brief Clinical Note:   The patient is a 89 year old male who sustained above-noted injury last evening when he lost his balance and fell trying to sit down in his wheelchair. The patient was brought to the emergency room where x-rays demonstrated the above-noted injury. The patient has been cleared medically and presents at this time for definitive management of the injury.  Procedure:   The patient was brought into the operating room and laid in the supine position. After adequate general endotracheal intubation and anesthesia was obtained, the patient was repositioned in the right lateral decubitus position and secured using a lateral hip positioner. The bony prominences along the lower part of the leg were properly padded and an axillary roll was utilized. The left hip and lower extremity were prepped with ChloroPrep solution before being draped sterilely. Preoperative antibiotics were administered. A timeout was performed to verify the appropriate surgical site.    A standard posterior approach to the hip was made through an approximately 4-5 inch incision. The incision was carried down through the subcutaneous tissues to expose the gluteal fascia and proximal end of the iliotibial band. These structures were split the length of the incision and the Charnley self-retaining hip retractor placed. The  bursal tissues were swept posteriorly to expose the short external rotators. The anterior border of the piriformis tendon was identified and this plane developed down through the capsule to enter the joint. Abundant fracture hematoma was suctioned. A flap of tissue was elevated off the posterior aspect of the femoral neck and greater trochanter and retracted posteriorly. This flap included the piriformis tendon, the short external rotators, and the posterior capsule. The femoral head was removed in its entirety, then taken to the back table where it was measured and found to be optimally replicated by a 56 mm head. The appropriate trial head was inserted and found to demonstrate an excellent suction fit.   Attention was directed to the femoral side. The femoral neck was recut 10-12 mm above the lesser trochanter using an oscillating saw. The piriformis fossa was debrided of soft tissues before the intramedullary canal was accessed through this point using a triple step reamer. The canal was reamed sequentially beginning with a #7 tapered reamer and progressing to a #15 tapered reamer. This provided excellent circumferential chatter. A box osteotome was used to establish version before the canal was broached sequentially beginning with a #11 broach and progressing to a #15 broach. This was left in place and several trial reductions performed.   The canal was prepared for cementing by rasping it lightly with a bottle brush before irrigating it thoroughly.  The distal cement restrictor was placed into the canal to the appropriate depth before the canal  was packed with an Neo-Synephrine soaked gauze.  Meanwhile, the cement was mixed on the back table.  When the cement was ready, the cement was injected using the cement gun and pressurized using the third-generation cement technique.  The #13 cemented stem was inserted into place with care taken to maintain the appropriate version, then held in place until the  cemented hardened.  The excess cement was removed using curettes.  A repeat trial reduction was performed using both the -6 mm and -3 mm neck lengths. The -3 mm neck length demonstrated excellent stability both in extension and external rotation as well as with flexion to 90 and internal rotation beyond 70. It also was stable in the position of sleep. The 56 mm outer diameter shell with the 28 mm head and -3 mm neck adapter construct was put together on the back table before being impacted onto the stem of the femoral component. The Morse taper locking mechanism was verified using manual distraction before the head was relocated and the hip placed through a range of motion with the findings as described above.  The wound was copiously irrigated with sterile saline solution via the jet lavage system before the peri-incisional and pericapsular tissues were injected with a "cocktail" of 20 cc of Exparel, 30 cc of 0.5% Sensorcaine, 2 cc of Kenalog  40 (80 mg), and 30 mg of Toradol diluted out to 60 cc with normal saline to help with postoperative analgesia. The posterior flap was reapproximated to the posterior aspect of the greater trochanter using #2 Tycron interrupted sutures placed through drill holes. The iliotibial band was reapproximated using #1 Vicryl interrupted sutures before the gluteal fascia was closed using a running #0 Vicryl suture. The subcutaneous tissues were closed in several layers using 2-0 Vicryl interrupted sutures before the skin was closed using staples. A sterile occlusive dressing was applied to the wound. The patient then was rolled back into the supine position on the hospital bed before being awakened, extubated, and returned to the recovery room in satisfactory condition after tolerating the procedure well.

## 2023-10-29 NOTE — Progress Notes (Signed)
   10/29/23 1420  Spiritual Encounters  Type of Visit Initial  Care provided to: Pt and family (Niece and her husband at bedside)  Referral source Nurse (RN/NT/LPN)  Reason for visit Advance directives  OnCall Visit No  Spiritual Framework  Presenting Themes Goals in life/care;Values and beliefs;Other (comment) (Tried to discuss AD info; Pt not able to answer simple questions at this time. Nurse suggests trying again after surgery. Told [name] family to review info and ask for a chaplain when they feel he's ready.)  Interventions  Spiritual Care Interventions Made Established relationship of care and support;Compassionate presence;Reflective listening;Prayer;Other (comment) (Niece asked for prayer for Pt.)  Intervention Outcomes  Outcomes Connection to spiritual care;Awareness around self/spiritual resourses;Connection to values and goals of care;Awareness of support;Other (comment) (for [name] family)  Advance Directives (For Healthcare)  Does Patient Have a Medical Advance Directive? No  Would patient like information on creating a medical advance directive? Yes (Inpatient - patient defers creating a medical advance directive at this time - Information given)  Mental Health Advance Directives  Does Patient Have a Mental Health Advance Directive? No  Would patient like information on creating a mental health advance directive? Yes (Inpatient - patient defers creating a mental health advance directive at this time - Information given)

## 2023-10-29 NOTE — Consult Note (Signed)
 ORTHOPAEDIC CONSULTATION  REQUESTING PHYSICIAN: Brenna Cam, MD  Chief Complaint:   Left hip pain.  History of Present Illness: Nicholas Gross is a 89 y.o. male with a history of hypertension, hyperlipidemia, coronary artery disease, and history of GI bleed who was in his usual state of health while at [facility] last evening when he apparently lost his balance and fell onto his left side while trying to sit down into his wheelchair.  The patient was brought to the emergency room where x-rays demonstrated a displaced left femoral neck fracture.  The patient denies any associated injuries.  He did not strike his head or lose consciousness.  The patient also denies any lightheadedness, dizziness, chest pain, shortness of breath, or other symptoms which may have precipitated his fall.  Past Medical History:  Diagnosis Date   CAD (coronary artery disease)    History of GI bleed 2008   History of tobacco use    17 pack years, quit around 1970   Hyperlipidemia    Hypertension    Overweight    Past Surgical History:  Procedure Laterality Date   CAROTID ENDARTERECTOMY Left 2010   CATARACT EXTRACTION     cypher stent  09/12/02   s/p cypher stent mid-LAD   TENDON REPAIR  1991   finger and arm   Social History   Socioeconomic History   Marital status: Widowed    Spouse name: Not on file   Number of children: Not on file   Years of education: some college in [employer]    Highest education level: High school graduate  Occupational History   Occupation: retired   Tobacco Use   Smoking status: Former    Current packs/day: 0.00    Average packs/day: 1 pack/day for 17.0 years (17.0 ttl pk-yrs)    Types: Cigarettes    Start date: 06/24/1951    Quit date: 06/23/1968    Years since quitting: 55.3   Smokeless tobacco: Never  Vaping Use   Vaping status: Never Used  Substance and Sexual Activity   Alcohol  use: No   Drug use:  No   Sexual activity: Not Currently  Other Topics Concern   Not on file  Social History Narrative   Attends church, neighbor take him    Social Drivers of Health   Financial Resource Strain: Low Risk  (04/20/2023)   Received from [facility]   Overall Financial Resource Strain (CARDIA)    Difficulty of Paying Living Expenses: Not very hard  Food Insecurity: No Food Insecurity (10/29/2023)   Hunger Vital Sign    Worried About Running Out of Food in the Last Year: Never true    Ran Out of Food in the Last Year: Never true  Transportation Needs: No Transportation Needs (10/29/2023)   PRAPARE - Administrator, Civil Service (Medical): No    Lack of Transportation (Non-Medical): No  Physical Activity: Unknown (05/13/2021)   Exercise Vital Sign    Days of Exercise per Week: 2 days    Minutes of Exercise per Session: Not on file  Stress: No Stress Concern Present (05/13/2021)   [employer] of Occupational Health - Occupational Stress Questionnaire    Feeling of Stress : Not at all  Social Connections: Socially Isolated (10/29/2023)   Social Connection and Isolation Panel [NHANES]    Frequency of Communication with Friends and Family: Once a week    Frequency of Social Gatherings with Friends and Family: More than three times a week  Attends Religious Services: Never    Active Member of Clubs or Organizations: No    Attends Banker Meetings: Never    Marital Status: Widowed   Family History  Problem Relation Age of Onset   Heart disease Father        possibly   Cancer Sister        breast   AAA (abdominal aortic aneurysm) Brother    Cancer Sister        lung   Allergies  Allergen Reactions   Other     Patient states that he is allergic to something that they place in the IV before they work on you   Simvastatin  Other (See Comments)    Myalgia    Prior to Admission medications   Medication Sig Start Date End Date Taking? Authorizing  Provider  acetaminophen  (TYLENOL ) 325 MG tablet Take 2 tablets (650 mg total) by mouth every 4 (four) hours as needed for moderate pain (pain score 4-6). 10/05/23 10/04/24 Yes Agbata, Tochukwu, MD  aspirin  EC 81 MG tablet Take 81 mg by mouth daily. Swallow whole.   Yes [provider]  carboxymethylcellulose (REFRESH TEARS) 0.5 % SOLN Apply 1 drop to eye 3 (three) times daily as needed. 10/07/23  Yes [provider]  clobetasol  ointment (TEMOVATE ) 0.05 % Apply the medication twice daily to stubborn areas of the skin until smooth. Then stop and re-start as the skin changes come back. 05/28/23  Yes [provider]  cyanocobalamin  (VITAMIN B12) 250 MCG tablet Take by mouth.   Yes [provider]  furosemide  (LASIX ) 40 MG tablet Take 1 tablet (40 mg total) by mouth daily. 07/06/23  Yes Johnson, Megan P, DO  lisinopril  (ZESTRIL ) 5 MG tablet Take 1 tablet (5 mg total) by mouth daily. 07/06/23  Yes Johnson, Megan P, DO  Multiple Vitamins-Minerals (PRESERVISION AREDS PO) Take 1 tablet by mouth 2 (two) times daily.   Yes [provider]  potassium chloride  (MICRO-K ) 10 MEQ CR capsule Take 10 mEq by mouth daily. 10/22/23  Yes [provider]  predniSONE  (DELTASONE ) 10 MG tablet Take 20 mg by mouth daily with breakfast. 09/17/23 12/16/23 Yes [provider]  rosuvastatin  (CRESTOR ) 10 MG tablet Take 1 tablet (10 mg total) by mouth daily. 07/06/23  Yes Johnson, Megan P, DO  tamsulosin  (FLOMAX ) 0.4 MG CAPS capsule Take 1 capsule (0.4 mg total) by mouth daily after supper. 10/05/23 11/04/23 Yes Agbata, Tochukwu, MD  doxycycline  (VIBRA -TABS) 100 MG tablet Take 100 mg by mouth 2 (two) times daily. 09/17/23 02/14/24  [provider]  White Petrolatum (WHITE PETROLEUM JELLY) GEL Apply topically. 04/24/23 04/23/24  [provider]   CT Head Wo Contrast Result Date: 10/29/2023 CLINICAL DATA:  Head trauma, minor (Age >= 65y); Neck trauma (Age >= 65y) EXAM: CT  HEAD WITHOUT CONTRAST CT CERVICAL SPINE WITHOUT CONTRAST TECHNIQUE: Multidetector CT imaging of the head and cervical spine was performed following the standard protocol without intravenous contrast. Multiplanar CT image reconstructions of the cervical spine were also generated. RADIATION DOSE REDUCTION: This exam was performed according to the departmental dose-optimization program which includes automated exposure control, adjustment of the mA and/or kV according to patient size and/or use of iterative reconstruction technique. COMPARISON:  None Available. FINDINGS: CT HEAD FINDINGS Brain: No evidence of acute infarction, hemorrhage, hydrocephalus, extra-axial collection or mass lesion/mass effect. Cerebral atrophy. Vascular: No hyperdense vessel. Skull: No acute fracture. Sinuses/Orbits: Clear sinuses.  No acute orbital findings. CT CERVICAL SPINE  FINDINGS Alignment: Similar mild anterolisthesis of C4 on C5. Otherwise, no substantial sagittal subluxation. Skull base and vertebrae: No evidence of acute fracture. Mild height loss of multiple upper thoracic vertebral bodies, similar to prior. Soft tissues and spinal canal: No prevertebral fluid or swelling. No visible canal hematoma. Disc levels:  Similar multilevel degenerative change. Upper chest: Visualized lung apices are clear. IMPRESSION: No acute intracranial or cervical spine finding. Electronically Signed   By: Stevenson Elbe M.D.   On: 10/29/2023 01:15   CT Cervical Spine Wo Contrast Result Date: 10/29/2023 CLINICAL DATA:  Head trauma, minor (Age >= 65y); Neck trauma (Age >= 65y) EXAM: CT HEAD WITHOUT CONTRAST CT CERVICAL SPINE WITHOUT CONTRAST TECHNIQUE: Multidetector CT imaging of the head and cervical spine was performed following the standard protocol without intravenous contrast. Multiplanar CT image reconstructions of the cervical spine were also generated. RADIATION DOSE REDUCTION: This exam was performed according to the departmental  dose-optimization program which includes automated exposure control, adjustment of the mA and/or kV according to patient size and/or use of iterative reconstruction technique. COMPARISON:  None Available. FINDINGS: CT HEAD FINDINGS Brain: No evidence of acute infarction, hemorrhage, hydrocephalus, extra-axial collection or mass lesion/mass effect. Cerebral atrophy. Vascular: No hyperdense vessel. Skull: No acute fracture. Sinuses/Orbits: Clear sinuses.  No acute orbital findings. CT CERVICAL SPINE FINDINGS Alignment: Similar mild anterolisthesis of C4 on C5. Otherwise, no substantial sagittal subluxation. Skull base and vertebrae: No evidence of acute fracture. Mild height loss of multiple upper thoracic vertebral bodies, similar to prior. Soft tissues and spinal canal: No prevertebral fluid or swelling. No visible canal hematoma. Disc levels:  Similar multilevel degenerative change. Upper chest: Visualized lung apices are clear. IMPRESSION: No acute intracranial or cervical spine finding. Electronically Signed   By: Stevenson Elbe M.D.   On: 10/29/2023 01:15   DG Chest 1 View Result Date: 10/28/2023 CLINICAL DATA:  Fall EXAM: CHEST  1 VIEW COMPARISON:  03/16/2023 FINDINGS: Heart and mediastinal contours are within normal limits. No focal opacities or effusions. No acute bony abnormality. Aortic atherosclerosis. IMPRESSION: No active disease. Electronically Signed   By: Janeece Mechanic M.D.   On: 10/28/2023 21:42   DG Hip Unilat W or Wo Pelvis 2-3 Views Left Result Date: 10/28/2023 CLINICAL DATA:  Injury, fall. EXAM: DG HIP (WITH OR WITHOUT PELVIS) 2-3V LEFT COMPARISON:  CT of the pelvis 09/28/2023. FINDINGS: The bones are diffusely osteopenic. Right superior and inferior pubic rami fractures are again seen. Evaluation is limited secondary to patient rotation. There is and new acute fracture through the left femoral neck, nondisplaced. There is no dislocation. Joint spaces are maintained. Peripheral vascular  calcifications are present. IMPRESSION: 1. Acute nondisplaced fracture through the left femoral neck. 2. Right superior and inferior pubic rami fractures are again seen. The Electronically Signed   By: Tyron Gallon M.D.   On: 10/28/2023 21:42   Positive ROS: All other systems have been reviewed and were otherwise negative with the exception of those mentioned in the HPI and as above.  Physical Exam: General:  Alert, no acute distress Psychiatric:  Patient appears to be competent  Cardiovascular:  No pedal edema Respiratory:  No wheezing, non-labored breathing GI:  Abdomen is soft and non-tender Skin:  No lesions in the area of chief complaint Neurologic:  Sensation intact distally Lymphatic:  No axillary or cervical lymphadenopathy  Orthopedic Exam:  Orthopedic examination is limited to the left hip and lower extremity.  The left lower extremity was somewhat shortened and externally  rotated as compared to the right.  Skin inspection around the left hip is unremarkable.  No swelling, erythema, ecchymosis, abrasions, or other skin abnormalities are identified.  The patient has mild tenderness palpation of the lateral aspect of the left hip.  He has more severe pain with any attempted active or passive motion of the hip.  He is grossly neurovascularly intact to the left lower extremity and foot.  X-rays:  Recent x-rays of the pelvis and left hip are available for review and have been reviewed by myself.  The findings are as described above.  No significant degenerative changes of the left hip joint are identified.  Old superior and inferior right pubic rami fractures are noted, but no other acute bony abnormalities are identified.  Assessment: Closed displaced left femoral neck fracture.  Plan: The treatment options, including both surgical and nonsurgical choices, have been discussed in detail with the patient and his family.  The patient and his family agree to proceed with surgical  intervention to include a left hip hemiarthroplasty.  The risks (including bleeding, infection, nerve and/or blood vessel injury, persistent or recurrent pain, loosening or failure of the components, leg length inequality, dislocation, need for further surgery, blood clots, strokes, heart attacks or arrhythmias, pneumonia, etc.) and benefits of the surgical procedure were discussed.  The patient states his/her understanding and agrees to proceed.  A formal written consent will be obtained by the nursing staff.  Thank you for asking me to participate in the care of this most pleasant yet unfortunate man.  I will be happy to follow him with you.   Lonnie Roberts, MD  Beeper #:  [phone]  10/29/2023 11:45 AM

## 2023-10-29 NOTE — Anesthesia Preprocedure Evaluation (Signed)
 Anesthesia Evaluation  Patient identified by MRN, date of birth, ID band Patient awake    Reviewed: Allergy & Precautions, H&P , NPO status , Patient's Chart, lab work & pertinent test results, reviewed documented beta blocker date and time   History of Anesthesia Complications Negative for: history of anesthetic complications  Airway Mallampati: III  TM Distance: >3 FB Neck ROM: full    Dental  (+) Edentulous Upper, Edentulous Lower, Dental Advidsory Given   Pulmonary neg shortness of breath, neg sleep apnea, neg COPD, neg recent URI, former smoker   Pulmonary exam normal breath sounds clear to auscultation       Cardiovascular Exercise Tolerance: Good hypertension, (-) angina + CAD and + Cardiac Stents  (-) Past MI and (-) CABG Normal cardiovascular exam(-) dysrhythmias (-) Valvular Problems/Murmurs Rhythm:regular Rate:Normal     Neuro/Psych neg Seizures CVA (Maybe??  No official diagnosis, but has little use of his hand and has some slurred speech.)  negative psych ROS   GI/Hepatic negative GI ROS, Neg liver ROS,,,  Endo/Other  negative endocrine ROS    Renal/GU negative Renal ROS  negative genitourinary   Musculoskeletal   Abdominal   Peds  Hematology negative hematology ROS (+)   Anesthesia Other Findings Past Medical History: No date: CAD (coronary artery disease) 2008: History of GI bleed No date: History of tobacco use     Comment:  17 pack years, quit around 1970 No date: Hyperlipidemia No date: Hypertension No date: Overweight   Reproductive/Obstetrics negative OB ROS                             Anesthesia Physical Anesthesia Plan  ASA: 3  Anesthesia Plan: General   Post-op Pain Management:    Induction: Intravenous  PONV Risk Score and Plan: 2 and Ondansetron , Dexamethasone and Treatment may vary due to age or medical condition  Airway Management Planned: Oral  ETT  Additional Equipment:   Intra-op Plan:   Post-operative Plan: Extubation in OR  Informed Consent: I have reviewed the patients History and Physical, chart, labs and discussed the procedure including the risks, benefits and alternatives for the proposed anesthesia with the patient or authorized representative who has indicated his/her understanding and acceptance.     Dental Advisory Given  Plan Discussed with: Anesthesiologist, CRNA and Surgeon  Anesthesia Plan Comments:         Anesthesia Quick Evaluation

## 2023-10-29 NOTE — Assessment & Plan Note (Signed)
 Pain control N.p.o. Orthopedics consult for further orders

## 2023-10-29 NOTE — Transfer of Care (Signed)
 Immediate Anesthesia Transfer of Care Note  Patient: Nicholas Gross  Procedure(s) Performed: HEMIARTHROPLASTY (BIPOLAR) HIP, POSTERIOR APPROACH FOR FRACTURE (Left)  Patient Location: PACU  Anesthesia Type:General  Level of Consciousness: drowsy  Airway & Oxygen Therapy: Patient Spontanous Breathing and Patient connected to face mask oxygen  Post-op Assessment: Report given to RN  Post vital signs: stable  Last Vitals:  Vitals Value Taken Time  BP 145/69 10/29/23 1815  Temp    Pulse 61 10/29/23 1817  Resp 19 10/29/23 1817  SpO2 100 % 10/29/23 1817  Vitals shown include unfiled device data.  Last Pain:  Vitals:   10/29/23 1438  TempSrc: Temporal  PainSc: 0-No pain         Complications: No notable events documented.

## 2023-10-29 NOTE — Final Progress Note (Addendum)
 Same-day rounding progress note  Patient seen and examined in the ED.  Waiting for the floor bed.  Please see Dr. Harrel Lim dictated history and physical for further details.  I agree with her assessment and plan.  Close displaced left femoral neck fracture Patient is medically clear for planned surgery.  He is at moderate risk considering his advanced age and other medical comorbidities.  Discussed with Dr. Daun Epstein.  Plan for surgery later afternoon  Time spent: 25 minutes

## 2023-10-29 NOTE — H&P (Signed)
 History and Physical    Patient: Nicholas Gross FAO:[id number] DOB: [birth year] DOA: 10/28/2023 DOS: the patient was seen and examined on 10/29/2023 PCP: Solomon Dupre, DO  Patient coming from: SNF  Chief Complaint:  Chief Complaint  Patient presents with   Fall   Migraine   Hip Pain    HPI: Nicholas Gross is a 89 y.o. male with medical history significant for HTN, CAD, PAD, bullous pemphigoid on chronic prednisone , CVA with residual right-sided weakness, frequent falls, chronic imbalance  recently admitted from 4/7 to 10/05/2023 with a fall with pelvic fracture and rhabdomyolysis managed nonsurgically, with stay complicated by acute urinary retention and altered mental status, discharged to SNF, who is being admitted with a left femoral neck fracture which he sustained from a likely accidental fall as he was trying to sit in his wheelchair.  He was previously in his usual state of health. ED course and data review:BP 161/66 with otherwise normal vitals Labs notable for potassium 3, WBC 12.8 and hemoglobin 10.  Urinalysis showed large leuks and positive nitrites. EKG, personally viewed and interpreted showing NSR at 61 CT head and C-spine nonacute Chest x-ray clear X-ray pelvis showing left femoral neck fracture  The ED provider spoke with orthopedist, Dr. Daun Epstein will see patient in the a.m.  Patient was having difficulty urinating in the ED and Foley is being placed at the time of request for admission     Past Medical History:  Diagnosis Date   CAD (coronary artery disease)    History of GI bleed 2008   History of tobacco use    17 pack years, quit around 1970   Hyperlipidemia    Hypertension    Overweight    Past Surgical History:  Procedure Laterality Date   CAROTID ENDARTERECTOMY Left 2010   CATARACT EXTRACTION     cypher stent  09/12/02   s/p cypher stent mid-LAD   TENDON REPAIR  1991   finger and arm   Social History:  reports that he quit smoking about 55  years ago. His smoking use included cigarettes. He started smoking about 72 years ago. He has a 17 pack-year smoking history. He has never used smokeless tobacco. He reports that he does not drink alcohol  and does not use drugs.  Allergies  Allergen Reactions   Other     Patient states that he is allergic to something that they place in the IV before they work on you   Simvastatin  Other (See Comments)    Myalgia     Family History  Problem Relation Age of Onset   Heart disease Father        possibly   Cancer Sister        breast   AAA (abdominal aortic aneurysm) Brother    Cancer Sister        lung    Prior to Admission medications   Medication Sig Start Date End Date Taking? Authorizing Provider  acetaminophen  (TYLENOL ) 325 MG tablet Take 2 tablets (650 mg total) by mouth every 4 (four) hours as needed for moderate pain (pain score 4-6). 10/05/23 10/04/24  Read Camel, MD  aspirin  EC 81 MG tablet Take 81 mg by mouth daily. Swallow whole.    [provider]  clobetasol  ointment (TEMOVATE ) 0.05 % Apply the medication twice daily to stubborn areas of the skin until smooth. Then stop and re-start as the skin changes come back. 05/28/23   [provider]  cyanocobalamin  (VITAMIN B12) 250  MCG tablet Take by mouth.    [provider]  doxycycline  (VIBRA -TABS) 100 MG tablet Take 100 mg by mouth 2 (two) times daily. 09/17/23 02/14/24  [provider]  furosemide  (LASIX ) 40 MG tablet Take 1 tablet (40 mg total) by mouth daily. 07/06/23   Johnson, Megan P, DO  lisinopril  (ZESTRIL ) 5 MG tablet Take 1 tablet (5 mg total) by mouth daily. 07/06/23   Johnson, Megan P, DO  Multiple Vitamins-Minerals (PRESERVISION AREDS PO) Take 1 tablet by mouth 2 (two) times daily.    [provider]  predniSONE  (DELTASONE ) 10 MG tablet Take 20 mg by mouth daily with breakfast. 09/17/23 12/16/23  [provider]  rosuvastatin  (CRESTOR ) 10 MG tablet Take 1 tablet (10  mg total) by mouth daily. 07/06/23   Johnson, Megan P, DO  tamsulosin  (FLOMAX ) 0.4 MG CAPS capsule Take 1 capsule (0.4 mg total) by mouth daily after supper. 10/05/23 11/04/23  Read Camel, MD  White Petrolatum (WHITE PETROLEUM JELLY) GEL Apply topically. 04/24/23 04/23/24  [provider]    Physical Exam: Vitals:   10/28/23 2059 10/28/23 2100 10/29/23 0044 10/29/23 0148  BP: (!) 161/66  (!) 159/66   Pulse: 66  67   Resp: 18  18   Temp: 98.6 F (37 C)   98.6 F (37 C)  TempSrc: Oral   Oral  SpO2: 100%  100%   Weight:  68 kg    Height:  5\' 8"  (1.727 m)     Physical Exam Vitals and nursing note reviewed.  Constitutional:      General: He is not in acute distress. HENT:     Head: Normocephalic and atraumatic.  Cardiovascular:     Rate and Rhythm: Normal rate and regular rhythm.     Heart sounds: Normal heart sounds.  Pulmonary:     Effort: Pulmonary effort is normal.     Breath sounds: Normal breath sounds.  Abdominal:     Palpations: Abdomen is soft.     Tenderness: There is no abdominal tenderness.  Neurological:     Mental Status: Mental status is at baseline.     Labs on Admission: I have personally reviewed following labs and imaging studies  CBC: Recent Labs  Lab 10/28/23 2347  WBC 12.8*  NEUTROABS 10.6*  HGB 10.0*  HCT 29.0*  MCV 95.7  PLT 180   Basic Metabolic Panel: Recent Labs  Lab 10/28/23 2347  NA 134*  K 3.0*  CL 98  CO2 26  GLUCOSE 94  BUN 35*  CREATININE 0.88  CALCIUM  8.4*   GFR: Estimated Creatinine Clearance: 49.4 mL/min (by C-G formula based on SCr of 0.88 mg/dL). Liver Function Tests: Recent Labs  Lab 10/28/23 2347  AST 20  ALT 26  ALKPHOS 109  BILITOT 1.1  PROT 5.5*  ALBUMIN 3.4*   No results for input(s): "LIPASE", "AMYLASE" in the last 168 hours. No results for input(s): "AMMONIA" in the last 168 hours. Coagulation Profile: Recent Labs  Lab 10/28/23 2347  INR 1.2   Cardiac Enzymes: No results for  input(s): "CKTOTAL", "CKMB", "CKMBINDEX", "TROPONINI" in the last 168 hours. BNP (last 3 results) No results for input(s): "PROBNP" in the last 8760 hours. HbA1C: No results for input(s): "HGBA1C" in the last 72 hours. CBG: No results for input(s): "GLUCAP" in the last 168 hours. Lipid Profile: No results for input(s): "CHOL", "HDL", "LDLCALC", "TRIG", "CHOLHDL", "LDLDIRECT" in the last 72 hours. Thyroid  Function Tests: No results for input(s): "TSH", "T4TOTAL", "FREET4", "T3FREE", "  THYROIDAB" in the last 72 hours. Anemia Panel: No results for input(s): "VITAMINB12", "FOLATE", "FERRITIN", "TIBC", "IRON", "RETICCTPCT" in the last 72 hours. Urine analysis:    Component Value Date/Time   COLORURINE YELLOW (A) 10/29/2023 0219   APPEARANCEUR CLOUDY (A) 10/29/2023 0219   APPEARANCEUR Clear 02/02/2023 1632   LABSPEC 1.015 10/29/2023 0219   PHURINE 8.0 10/29/2023 0219   GLUCOSEU NEGATIVE 10/29/2023 0219   HGBUR NEGATIVE 10/29/2023 0219   BILIRUBINUR NEGATIVE 10/29/2023 0219   BILIRUBINUR Negative 02/02/2023 1632   KETONESUR NEGATIVE 10/29/2023 0219   PROTEINUR NEGATIVE 10/29/2023 0219   NITRITE POSITIVE (A) 10/29/2023 0219   LEUKOCYTESUR LARGE (A) 10/29/2023 0219    Radiological Exams on Admission: CT Head Wo Contrast Result Date: 10/29/2023 CLINICAL DATA:  Head trauma, minor (Age >= 65y); Neck trauma (Age >= 65y) EXAM: CT HEAD WITHOUT CONTRAST CT CERVICAL SPINE WITHOUT CONTRAST TECHNIQUE: Multidetector CT imaging of the head and cervical spine was performed following the standard protocol without intravenous contrast. Multiplanar CT image reconstructions of the cervical spine were also generated. RADIATION DOSE REDUCTION: This exam was performed according to the departmental dose-optimization program which includes automated exposure control, adjustment of the mA and/or kV according to patient size and/or use of iterative reconstruction technique. COMPARISON:  None Available. FINDINGS: CT  HEAD FINDINGS Brain: No evidence of acute infarction, hemorrhage, hydrocephalus, extra-axial collection or mass lesion/mass effect. Cerebral atrophy. Vascular: No hyperdense vessel. Skull: No acute fracture. Sinuses/Orbits: Clear sinuses.  No acute orbital findings. CT CERVICAL SPINE FINDINGS Alignment: Similar mild anterolisthesis of C4 on C5. Otherwise, no substantial sagittal subluxation. Skull base and vertebrae: No evidence of acute fracture. Mild height loss of multiple upper thoracic vertebral bodies, similar to prior. Soft tissues and spinal canal: No prevertebral fluid or swelling. No visible canal hematoma. Disc levels:  Similar multilevel degenerative change. Upper chest: Visualized lung apices are clear. IMPRESSION: No acute intracranial or cervical spine finding. Electronically Signed   By: Stevenson Elbe M.D.   On: 10/29/2023 01:15   CT Cervical Spine Wo Contrast Result Date: 10/29/2023 CLINICAL DATA:  Head trauma, minor (Age >= 65y); Neck trauma (Age >= 65y) EXAM: CT HEAD WITHOUT CONTRAST CT CERVICAL SPINE WITHOUT CONTRAST TECHNIQUE: Multidetector CT imaging of the head and cervical spine was performed following the standard protocol without intravenous contrast. Multiplanar CT image reconstructions of the cervical spine were also generated. RADIATION DOSE REDUCTION: This exam was performed according to the departmental dose-optimization program which includes automated exposure control, adjustment of the mA and/or kV according to patient size and/or use of iterative reconstruction technique. COMPARISON:  None Available. FINDINGS: CT HEAD FINDINGS Brain: No evidence of acute infarction, hemorrhage, hydrocephalus, extra-axial collection or mass lesion/mass effect. Cerebral atrophy. Vascular: No hyperdense vessel. Skull: No acute fracture. Sinuses/Orbits: Clear sinuses.  No acute orbital findings. CT CERVICAL SPINE FINDINGS Alignment: Similar mild anterolisthesis of C4 on C5. Otherwise, no  substantial sagittal subluxation. Skull base and vertebrae: No evidence of acute fracture. Mild height loss of multiple upper thoracic vertebral bodies, similar to prior. Soft tissues and spinal canal: No prevertebral fluid or swelling. No visible canal hematoma. Disc levels:  Similar multilevel degenerative change. Upper chest: Visualized lung apices are clear. IMPRESSION: No acute intracranial or cervical spine finding. Electronically Signed   By: Stevenson Elbe M.D.   On: 10/29/2023 01:15   DG Chest 1 View Result Date: 10/28/2023 CLINICAL DATA:  Fall EXAM: CHEST  1 VIEW COMPARISON:  03/16/2023 FINDINGS: Heart and mediastinal contours are within normal  limits. No focal opacities or effusions. No acute bony abnormality. Aortic atherosclerosis. IMPRESSION: No active disease. Electronically Signed   By: Janeece Mechanic M.D.   On: 10/28/2023 21:42   DG Hip Unilat W or Wo Pelvis 2-3 Views Left Result Date: 10/28/2023 CLINICAL DATA:  Injury, fall. EXAM: DG HIP (WITH OR WITHOUT PELVIS) 2-3V LEFT COMPARISON:  CT of the pelvis 09/28/2023. FINDINGS: The bones are diffusely osteopenic. Right superior and inferior pubic rami fractures are again seen. Evaluation is limited secondary to patient rotation. There is and new acute fracture through the left femoral neck, nondisplaced. There is no dislocation. Joint spaces are maintained. Peripheral vascular calcifications are present. IMPRESSION: 1. Acute nondisplaced fracture through the left femoral neck. 2. Right superior and inferior pubic rami fractures are again seen. The Electronically Signed   By: Tyron Gallon M.D.   On: 10/28/2023 21:42   Data Reviewed for HPI: Relevant notes from primary care and specialist visits, past discharge summaries as available in EHR, including Care Everywhere. Prior diagnostic testing as pertinent to current admission diagnoses Updated medications and problem lists for reconciliation ED course, including vitals, labs, imaging,  treatment and response to treatment Triage notes, nursing and pharmacy notes and ED provider's notes Notable results as noted above in HPI      Assessment and Plan: * Closed displaced fracture of left femoral neck (HCC) Pain control N.p.o. Orthopedics consult for further orders  Acute urinary retention BPH Foley catheter being placed in the ED Continue tamsulosin   Suspect urinary tract infection Urinalysis consistent with UTI with positive leuks and nitrites Rocephin  Follow cultures  PAD (peripheral artery disease) (HCC) History of carotid endarterectomy Continue aspirin  and rosuvastatin  Distal pulses were dopplerable in the ED   CAD (coronary artery disease) No complaints of chest pain and EKG nonacute Continue lisinopril , rosuvastatin  and aspirin    Hypertension Continue lisinopril   Dysarthria/aphasia secondary to old CVA Increase nursing assistance for communication of needs  Bullous pemphigoid  On chronic prednisone  and doxycycline   DVT prophylaxis: SCDs  Consults: Ortho pedis, Dr. Daun Epstein  Advance Care Planning:   Code Status: Prior   Family Communication: none  Disposition Plan: Back to previous home environment  Severity of Illness: The appropriate patient status for this patient is INPATIENT. Inpatient status is judged to be reasonable and necessary in order to provide the required intensity of service to ensure the patient's safety. The patient's presenting symptoms, physical exam findings, and initial radiographic and laboratory data in the context of their chronic comorbidities is felt to place them at high risk for further clinical deterioration. Furthermore, it is not anticipated that the patient will be medically stable for discharge from the hospital within 2 midnights of admission.   * I certify that at the point of admission it is my clinical judgment that the patient will require inpatient hospital care spanning beyond 2 midnights from the point  of admission due to high intensity of service, high risk for further deterioration and high frequency of surveillance required.*  Author: Lanetta Pion, MD 10/29/2023 2:49 AM  For on call review [email or web address].

## 2023-10-30 ENCOUNTER — Encounter: Payer: Self-pay | Admitting: Surgery

## 2023-10-30 DIAGNOSIS — S72002A Fracture of unspecified part of neck of left femur, initial encounter for closed fracture: Secondary | ICD-10-CM | POA: Diagnosis not present

## 2023-10-30 DIAGNOSIS — I69322 Dysarthria following cerebral infarction: Secondary | ICD-10-CM | POA: Diagnosis not present

## 2023-10-30 DIAGNOSIS — E440 Moderate protein-calorie malnutrition: Secondary | ICD-10-CM

## 2023-10-30 DIAGNOSIS — I10 Essential (primary) hypertension: Secondary | ICD-10-CM | POA: Diagnosis not present

## 2023-10-30 LAB — CBC
HCT: 24.6 % — ABNORMAL LOW (ref 39.0–52.0)
Hemoglobin: 8.1 g/dL — ABNORMAL LOW (ref 13.0–17.0)
MCH: 32 pg (ref 26.0–34.0)
MCHC: 32.9 g/dL (ref 30.0–36.0)
MCV: 97.2 fL (ref 80.0–100.0)
Platelets: 132 10*3/uL — ABNORMAL LOW (ref 150–400)
RBC: 2.53 MIL/uL — ABNORMAL LOW (ref 4.22–5.81)
RDW: 13.2 % (ref 11.5–15.5)
WBC: 9.6 10*3/uL (ref 4.0–10.5)
nRBC: 0 % (ref 0.0–0.2)

## 2023-10-30 LAB — BASIC METABOLIC PANEL WITH GFR
Anion gap: 10 (ref 5–15)
BUN: 23 mg/dL (ref 8–23)
CO2: 25 mmol/L (ref 22–32)
Calcium: 7.9 mg/dL — ABNORMAL LOW (ref 8.9–10.3)
Chloride: 101 mmol/L (ref 98–111)
Creatinine, Ser: 0.82 mg/dL (ref 0.61–1.24)
GFR, Estimated: >60 mL/min (ref >60)
Glucose, Bld: 143 mg/dL — ABNORMAL HIGH (ref 70–99)
Potassium: 3.7 mmol/L (ref 3.5–5.1)
Sodium: 136 mmol/L (ref 135–145)

## 2023-10-30 MED ORDER — ENSURE ENLIVE PO LIQD
237.0000 mL | Freq: Two times a day (BID) | ORAL | Status: DC
  Administered 2023-10-31 – 2023-11-05 (×11): 237 mL via ORAL

## 2023-10-30 NOTE — Evaluation (Signed)
 Physical Therapy Evaluation Patient Details Name: Nicholas Gross MRN: [id number] DOB: [birth year] Today's Date: 10/30/2023  History of Present Illness  Nicholas Gross is 89 y.o. male who lost his balance and fell trying to sit down in his wheelchair.  Pt is s/p L hemiarthroplasty on 10/29/23.  PMH: CAD, HLD, PAD. Workup revealing of subactue Lt 7th rib fracture. Head/cervical CT negative.   Clinical Impression  Pt received in Semi-Fowler's position and agreeable to therapy.  Pt able to assist with transfers, however required modA to come upright into sitting position with use of the chuck pad.  Pt also needing modA to come upright into standing position.  Pt with difficulty expressing himself at times and is only alert and oriented to person.  Pt able to following commands, and even able to side step towards the [facility].  Pt required modA to bring LE's into the bed and repositioned in bed.  Pt left with all needs met and niece in room.        If plan is discharge home, recommend the following: Two people to help with walking and/or transfers;A lot of help with bathing/dressing/bathroom;Help with stairs or ramp for entrance;Assist for transportation   Can travel by private vehicle   No    Equipment Recommendations None recommended by PT  Recommendations for Other Services       Functional Status Assessment Patient has had a recent decline in their functional status and demonstrates the ability to make significant improvements in function in a reasonable and predictable amount of time.     Precautions / Restrictions        Mobility  Bed Mobility Overal bed mobility: Needs Assistance Bed Mobility: Supine to Sit, Sit to Supine     Supine to sit: Mod assist Sit to supine: Mod assist   General bed mobility comments: modA for LE navigation in/out of the bed.    Transfers Overall transfer level: Needs assistance Equipment used: Rolling walker (2 wheels) Transfers: Sit to/from  Stand Sit to Stand: Min assist           General transfer comment: slightly elevated bed due to pt's height    Ambulation/Gait         Gait velocity: decreased     General Gait Details: no formal gait, but able to side step 2-3 steps to get to [facility] for bed positioning.  Stairs            Wheelchair Mobility     Tilt Bed    Modified Rankin (Stroke Patients Only)       Balance Overall balance assessment: Needs assistance Sitting-balance support: No upper extremity supported, Feet supported Sitting balance-[name]: Fair     Standing balance support: Bilateral upper extremity supported, During functional activity, Reliant on assistive device for balance Standing balance-[name]: Poor                               Pertinent Vitals/Pain Pain Assessment Pain Assessment: Faces Faces Pain Scale: Hurts worst Pain Location: L LE when mobilizing Pain Descriptors / Indicators: Aching, Pounding, Sharp, Sore Pain Intervention(s): Limited activity within patient's tolerance, Monitored during session, Premedicated before session, Repositioned    Home Living Family/patient expects to be discharged to:: Skilled nursing facility                   Additional Comments: [name] niece in room upon arrival and provided history noting  that pt is anticipated to return to SNF.    Prior Function Prior Level of Function : Patient poor historian/Family not available                     Extremity/Trunk Assessment   Upper Extremity Assessment Upper Extremity Assessment: Generalized weakness    Lower Extremity Assessment Lower Extremity Assessment: Generalized weakness       Communication   Communication Communication: Impaired Factors Affecting Communication: Difficulty expressing self    Cognition Arousal: Alert Behavior During Therapy: Flat affect   PT - Cognitive impairments: Difficult to assess Difficult to assess due to:  Impaired communication                     PT - Cognition Comments: Pt with limited speech and mumbling a lot.  Niece states he was not like this 5 min prior to PT arrival to room, however nurse reports this being how she has seen him all day. Following commands: Intact       Cueing Cueing Techniques: Verbal cues, Gestural cues, Tactile cues     General Comments      Exercises     Assessment/Plan    PT Assessment Patient needs continued PT services  PT Problem List Decreased strength;Decreased range of motion;Decreased activity tolerance;Decreased balance;Decreased mobility;Decreased knowledge of use of DME;Decreased safety awareness       PT Treatment Interventions DME instruction;Gait training;Functional mobility training;Therapeutic activities;Therapeutic exercise;Balance training;Neuromuscular re-education    PT Goals (Current goals can be found in the Care Plan section)  Acute Rehab PT Goals Patient Stated Goal: unable to establish due to pt having difficulty expressing himself PT Goal Formulation: With patient/family Time For Goal Achievement: 11/13/23    Frequency Min 2X/week     Co-evaluation               AM-PAC PT "6 Clicks" Mobility  Outcome Measure Help needed turning from your back to your side while in a flat bed without using bedrails?: A Lot Help needed moving from lying on your back to sitting on the side of a flat bed without using bedrails?: A Lot Help needed moving to and from a bed to a chair (including a wheelchair)?: A Lot Help needed standing up from a chair using your arms (e.g., wheelchair or bedside chair)?: A Lot Help needed to walk in hospital room?: Total Help needed climbing 3-5 steps with a railing? : Total 6 Click Score: 10    End of Session   Activity Tolerance: Patient tolerated treatment well Patient left: in bed;with call bell/phone within reach;with bed alarm set Nurse Communication: Mobility status PT Visit  Diagnosis: Unsteadiness on feet (R26.81);Other abnormalities of gait and mobility (R26.89);Muscle weakness (generalized) (M62.81);Difficulty in walking, not elsewhere classified (R26.2)    Time: 4098-1191 PT Time Calculation (min) (ACUTE ONLY): 30 min   Charges:   PT Evaluation $PT Eval Moderate Complexity: 1 Mod PT Treatments $Therapeutic Activity: 8-22 mins PT General Charges $$ ACUTE PT VISIT: 1 Visit         Rozanna Corner, PT, DPT Physical Therapist - [facility]  10/30/23, 2:26 PM

## 2023-10-30 NOTE — Evaluation (Signed)
 Occupational Therapy Evaluation Patient Details Name: Nicholas Gross MRN: [id number] DOB: Nov 25, 1927 Today's Date: 10/30/2023   History of Present Illness   Nicholas Gross is 89 y.o. male who lost his balance and fell trying to sit down in his wheelchair.  Pt is s/p L hemiarthroplasty on 10/29/23.  PMH: CAD, HLD, PAD. Workup revealing of subactue Lt 7th rib fracture. Head/cervical CT negative.   Clinical Impressions Nicholas Gross was seen for OT evaluation this date. Prior to hospital admission, pt was recently at [facility], prior to which pt was MOD I using RW. Pt lives alone but currently at [facility]. Pt currently requires MIN A + RW sit<>stand x2 and side steps along EOB, decreasing to MOD A for static standing balance as pt fatigues. Expressive aphasia limiting history but pt follows commands with increased time. Pt would benefit from skilled OT to address noted impairments and functional limitations (see below for any additional details). Upon hospital discharge, recommend OT follow up.   If plan is discharge home, recommend the following:   A lot of help with walking and/or transfers;Supervision due to cognitive status;Help with stairs or ramp for entrance;Two people to help with bathing/dressing/bathroom     Functional Status Assessment   Patient has had a recent decline in their functional status and demonstrates the ability to make significant improvements in function in a reasonable and predictable amount of time.     Equipment Recommendations   Other (comment) (defer)     Recommendations for Other Services         Precautions/Restrictions   Precautions Precautions: Fall;Posterior Hip Recall of Precautions/Restrictions: Impaired Restrictions Weight Bearing Restrictions Per Provider Order: Yes RLE Weight Bearing Per Provider Order: Weight bearing as tolerated LLE Weight Bearing Per Provider Order: Weight bearing as tolerated     Mobility Bed Mobility Overal bed  mobility: Needs Assistance Bed Mobility: Supine to Sit, Sit to Supine     Supine to sit: Max assist Sit to supine: Mod assist, +2 for physical assistance        Transfers Overall transfer level: Needs assistance Equipment used: Rolling walker (2 wheels) Transfers: Sit to/from Stand Sit to Stand: Mod assist, From elevated surface                  Balance Overall balance assessment: Needs assistance Sitting-balance support: No upper extremity supported, Feet supported Sitting balance-[name]: Fair     Standing balance support: Bilateral upper extremity supported Standing balance-[name]: Poor                             ADL either performed or assessed with clinical judgement   ADL Overall ADL's : Needs assistance/impaired                                       General ADL Comments: MIN A + RW for simulated BSC t/f. MAX A don/doff B socks in sitting     Vision         Perception         Praxis         Pertinent Vitals/Pain Pain Assessment Pain Assessment: PAINAD Breathing: normal Negative Vocalization: none Facial Expression: sad, frightened, frown Body Language: relaxed Consolability: distracted or reassured by voice/touch PAINAD Score: 2 Pain Location: L LE when mobilizing Pain Descriptors / Indicators: Aching, Pounding, Sharp, Sore  Pain Intervention(s): Limited activity within patient's tolerance, Repositioned     Extremity/Trunk Assessment Upper Extremity Assessment Upper Extremity Assessment: Generalized weakness;RUE deficits/detail RUE Deficits / Details: hx of CVA   Lower Extremity Assessment Lower Extremity Assessment: Generalized weakness       Communication Communication Communication: Impaired Factors Affecting Communication: Difficulty expressing self   Cognition Arousal: Alert Behavior During Therapy: Flat affect Cognition: Difficult to assess Difficult to assess due to: Impaired  communication           OT - Cognition Comments: hx of aphasia                 Following commands: Intact       Cueing  General Comments   Cueing Techniques: Verbal cues;Gestural cues;Tactile cues      Exercises     Shoulder Instructions      Home Living Family/patient expects to be discharged to:: Skilled nursing facility                                 Additional Comments: arrived from [facility], prior to which pt lived alone MOD I using RW      Prior Functioning/Environment Prior Level of Function : Patient poor historian/Family not available             Mobility Comments: prior to recent STR pt was MOD I using RW.      OT Problem List: Decreased strength;Decreased range of motion;Decreased activity tolerance;Impaired balance (sitting and/or standing);Decreased safety awareness   OT Treatment/Interventions: Self-care/ADL training;Therapeutic exercise;Energy conservation;DME and/or AE instruction;Therapeutic activities;Patient/family education      OT Goals(Current goals can be found in the care plan section)   Acute Rehab OT Goals Patient Stated Goal: to walk OT Goal Formulation: With family Time For Goal Achievement: 11/13/23 Potential to Achieve Goals: Fair ADL Goals Pt Will Perform Grooming: sitting;with min assist Pt Will Perform Lower Body Dressing: with min assist;sit to/from stand Pt Will Transfer to Toilet: with contact guard assist;stand pivot transfer;bedside commode   OT Frequency:  Min 2X/week    Co-evaluation              AM-PAC OT "6 Clicks" Daily Activity     Outcome Measure Help from another person eating meals?: None Help from another person taking care of personal grooming?: A Little Help from another person toileting, which includes using toliet, bedpan, or urinal?: A Lot Help from another person bathing (including washing, rinsing, drying)?: A Lot Help from another person to put on and  taking off regular upper body clothing?: A Little Help from another person to put on and taking off regular lower body clothing?: A Lot 6 Click Score: 16   End of Session Equipment Utilized During Treatment: Rolling walker (2 wheels) Nurse Communication: Mobility status  Activity Tolerance: Patient tolerated treatment well Patient left: in bed;with call bell/phone within reach;with bed alarm set;with family/visitor present  OT Visit Diagnosis: Other abnormalities of gait and mobility (R26.89);Muscle weakness (generalized) (M62.81)                Time: 1610-9604 OT Time Calculation (min): 20 min Charges:  OT General Charges $OT Visit: 1 Visit OT Evaluation $OT Eval Moderate Complexity: 1 Mod  Gordan Latina, M.S. OTR/L  10/30/23, 4:04 PM  ascom [phone]

## 2023-10-30 NOTE — Progress Notes (Signed)
   10/30/23 1330  Spiritual Encounters  Type of Visit Follow up  Referral source Chaplain team  Reason for visit Advance directives  OnCall Visit No  Interventions  Spiritual Care Interventions Made Established relationship of care and support;Compassionate presence  Intervention Outcomes  Outcomes Connection to spiritual care   Chaplain was able to secure notary and two witnesses for A.D., but the patient was unable to physically sign. The witnesses left the room and the notary had to confirm if they could actually approve a document if the patient at least made an attempt to sign. After talking to a notary that has been here a while, the notary can sign if they see the patient actually make an attempt to sign. The two witnesses still need to sign first. Another notary is still needed due to pt being unable to sign while the witnesses were there. Chaplain told the family they would have someone follow up with them in hopes to complete the process.

## 2023-10-30 NOTE — Progress Notes (Signed)
 Initial Nutrition Assessment  DOCUMENTATION CODES:   Non-severe (moderate) malnutrition in context of chronic illness  INTERVENTION:   -Ensure Enlive po BID, each supplement provides 350 kcal and 20 grams of protein -MVI with minerals daily -Liberalize diet to regular for wider variety of meal selections -Case discussed with MD; plan for SLP consult for potential for diet upgrade   NUTRITION DIAGNOSIS:   Moderate Malnutrition related to chronic illness (CVA) as evidenced by mild fat depletion, mild muscle depletion.  GOAL:   Patient will meet greater than or equal to 90% of their needs  MONITOR:   PO intake, Supplement acceptance, Diet advancement  REASON FOR ASSESSMENT:   Consult Assessment of nutrition requirement/status, Hip fracture protocol  ASSESSMENT:   Pt with medical history significant for HTN, CAD, PAD, bullous pemphigoid on chronic prednisone , CVA with residual right-sided weakness, frequent falls, chronic imbalance  recently admitted from 4/7 to 10/05/2023 with a fall with pelvic fracture and rhabdomyolysis managed nonsurgically, with stay complicated by acute urinary retention and altered mental status, discharged to SNF, who is being admitted with a left femoral neck fracture which he sustained from a likely accidental fall as he was trying to sit in his wheelchair  Pt admitted with closed lt femoral neck fracture.   5/8 s/p Left hip bipolar hemiarthroplasty   Reviewed I/O's: +492 ml x 24 hours  UOP: 1.1 L x 24 hours  Spoke with pt and niece at bedside. Niece provides most of the history. Pt consumed a regular diet this morning and consumed 100%. Niece and RN reported no difficulty chewing or swallowing. Per niece, pt was on a puree diet with thin liquids at [facility]. Reviewed chart from prior admission; pt was placed on a puree diet per SLP due to dysphagia with solids. Per niece, agreeable to SLP evaluation for swallow safety and potential diet  advancement. Case discussed with MD and SLP.   Pt has experienced a 1.4% wt loss over the past 4 months, which is not significant for time frame.   Discussed importance of good meal and supplement intake to promote healing. Pt amenable to supplements.   Medications reviewed and include colace, lovenox , potassium chloride , potassium chloride , deltasone , and 0.9% sodium chloride  infusion @ 75 ml/hr.   Labs reviewed: CBGS: 108 (inpatient orders for glycemic control are none).    NUTRITION - FOCUSED PHYSICAL EXAM:  Flowsheet Row Most Recent Value  Orbital Region No depletion  Upper Arm Region Mild depletion  Thoracic and Lumbar Region No depletion  Buccal Region Mild depletion  Temple Region Mild depletion  Clavicle Bone Region Mild depletion  Clavicle and Acromion Bone Region Mild depletion  Scapular Bone Region Mild depletion  Dorsal Hand Mild depletion  Patellar Region No depletion  Anterior Thigh Region No depletion  Posterior Calf Region No depletion  Edema (RD Assessment) None  Hair Reviewed  Eyes Reviewed  Mouth Reviewed  Skin Reviewed  Nails Reviewed       Diet Order:   Diet Order             DIET - DYS 1 Fluid consistency: Thin  Diet effective now                   EDUCATION NEEDS:   Education needs have been addressed  Skin:  Skin Assessment: Skin Integrity Issues: Skin Integrity Issues:: Incisions Incisions: closed lt hip  Last BM:  Unknown  Height:   Ht Readings from Last 1 Encounters:  10/29/23 5'  8" (1.727 m)    Weight:   Wt Readings from Last 1 Encounters:  10/29/23 68 kg    Ideal Body Weight:  70 kg  BMI:  Body mass index is 22.79 kg/m.  Estimated Nutritional Needs:   Kcal:  1700-1900  Protein:  90-105 grams  Fluid:  1.7-1.9 L    Herschel Lords, RD, LDN, CDCES Registered Dietitian III Certified Diabetes Care and Education Specialist If unable to reach this RD, please use "RD Inpatient" group chat on secure chat between  hours of 8am-4 pm daily

## 2023-10-30 NOTE — Progress Notes (Signed)
  Subjective: 1 Day Post-Op Procedure(s) (LRB): HEMIARTHROPLASTY (BIPOLAR) HIP, POSTERIOR APPROACH FOR FRACTURE (Left) Patient denies any significant pain this AM. Family in the room. Patient is well, and has had no acute complaints or problems PT and care management to assist with discharge planning.  Was at [employer] from a previous fall. Negative for chest pain and shortness of breath Fever: no Gastrointestinal:Negative for nausea and vomiting  Objective: Vital signs in last 24 hours: Temp:  [96.9 F (36.1 C)-99.2 F (37.3 C)] 97.5 F (36.4 C) (05/09 0451) Pulse Rate:  [55-88] 55 (05/09 0451) Resp:  [14-20] 18 (05/09 0451) BP: (122-168)/(60-81) 134/61 (05/09 0451) SpO2:  [96 %-100 %] 100 % (05/09 0451) Weight:  [68 kg] 68 kg (05/08 1438)  Intake/Output from previous day:  Intake/Output Summary (Last 24 hours) at 10/30/2023 0723 Last data filed at 10/30/2023 0439 Gross per 24 hour  Intake 1612.73 ml  Output 1120 ml  Net 492.73 ml    Intake/Output this shift: No intake/output data recorded.  Labs: Recent Labs    10/28/23 2347 10/30/23 0528  HGB 10.0* 8.1*   Recent Labs    10/28/23 2347 10/30/23 0528  WBC 12.8* 9.6  RBC 3.03* 2.53*  HCT 29.0* 24.6*  PLT 180 132*   Recent Labs    10/28/23 2347 10/30/23 0528  NA 134* 136  K 3.0* 3.7  CL 98 101  CO2 26 25  BUN 35* 23  CREATININE 0.88 0.82  GLUCOSE 94 143*  CALCIUM  8.4* 7.9*   Recent Labs    10/28/23 2347  INR 1.2     EXAM General - Patient is Alert and Appropriate.  Answers basic questions this morning. Extremity - ABD soft Neurovascular intact Dorsiflexion/Plantar flexion intact Incision: dressing C/D/I No cellulitis present Dressing/Incision - clean, dry, no drainage noted to the left hip honeycomb dressing. Motor Function - intact, moving foot and toes well on exam.  Abdomen soft with intact bowel sounds this AM.  Past Medical History:  Diagnosis Date   CAD (coronary  artery disease)    History of GI bleed 2008   History of tobacco use    17 pack years, quit around 1970   Hyperlipidemia    Hypertension    Overweight     Assessment/Plan: 1 Day Post-Op Procedure(s) (LRB): HEMIARTHROPLASTY (BIPOLAR) HIP, POSTERIOR APPROACH FOR FRACTURE (Left) Principal Problem:   Closed displaced fracture of left femoral neck (HCC) Active Problems:   CAD (coronary artery disease)   PAD (peripheral artery disease) (HCC)   Fall   Urinary retention   Essential hypertension   Dysarthria as late effect of cerebrovascular accident (CVA)   Urinary tract infection  Estimated body mass index is 22.79 kg/m as calculated from the following:   Height as of this encounter: 5\' 8"  (1.727 m).   Weight as of this encounter: 68 kg. Advance diet Up with therapy D/C IV fluids when tolerating po intake.  Labs and vitals reviewed. Hg 8.1 this AM. Up with PT. Care management to assist with d/c planning, was at [facility] prior to fall. Continue to work on a BM.  DVT Prophylaxis - Lovenox  and TED hose Weight-Bearing as tolerated to left leg  J. Edie Goon, PA-C [facility] Orthopaedic Surgery 10/30/2023, 7:23 AM

## 2023-10-30 NOTE — Progress Notes (Signed)
   10/30/23 1130  Spiritual Encounters  Type of Visit Follow up  Care provided to: Pt and family ([name] niece at bedside)  Referral source Nurse (RN/NT/LPN)  Reason for visit Advance directives (Working to find a Conservation officer, nature; ongoing process)  OnCall Visit No  Merchant navy officer (For Healthcare)  Does Patient Have a Medical Advance Directive? No (Working to find a Conservation officer, nature; this is an ongoing process.)

## 2023-10-30 NOTE — Progress Notes (Signed)
   10/30/23 1500  Spiritual Encounters  Type of Visit Follow up  Care provided to: Pt and family (Sister at bedside)  Referral source Nurse (RN/NT/LPN)  Reason for visit Advance directives (Not able to procure a notary; will forward this request to the oncoming chaplain and Vester Gouty (Sat. notary))  OnCall Visit No  Advance Directives (For Healthcare)  Does Patient Have a Medical Advance Directive? No (Not able to procure notary; will pass request to oncoming chaplain and Sat. morning notary.)

## 2023-10-30 NOTE — Progress Notes (Signed)
 1      PROGRESS NOTE    Nicholas Gross  VPX:[id number] DOB: [birth year] DOA: 10/28/2023 PCP: Solomon Dupre, DO    Brief Narrative:   89 y.o. male with medical history significant for HTN, CAD, PAD, bullous pemphigoid on chronic prednisone , CVA with residual right-sided weakness, frequent falls, chronic imbalance  recently admitted from 4/7 to 10/05/2023 with a fall with pelvic fracture and rhabdomyolysis managed nonsurgically, with stay complicated by acute urinary retention and altered mental status, discharged to SNF, who is being admitted with a left femoral neck fracture which he sustained from a likely accidental fall as he was trying to sit in his wheelchair.   5/8: Orthopedic consult, Left hip bipolar hemiarthroplasty.  5/9: PT and OT eval   Assessment & Plan:   Principal Problem:   Closed displaced fracture of left femoral neck (HCC) Active Problems:   Fall   Urinary retention   Urinary tract infection   Essential hypertension   CAD (coronary artery disease)   PAD (peripheral artery disease) (HCC)   Dysarthria as late effect of cerebrovascular accident (CVA)   Malnutrition of moderate degree   * Closed displaced fracture of left femoral neck (HCC) Status post left hip bipolar hemiarthroplasty on 5/8 DVT prophylaxis and pain management per Ortho   Acute urinary retention BPH Foley catheter being placed in the ED Continue tamsulosin    Suspect urinary tract infection Urinalysis consistent with UTI with positive leuks and nitrites Continue Rocephin  Follow cultures   PAD (peripheral artery disease) (HCC) History of carotid endarterectomy Continue aspirin  and rosuvastatin    CAD (coronary artery disease) No complaints of chest pain and EKG nonacute Continue lisinopril , rosuvastatin  and aspirin    Hypertension Continue lisinopril    Dysarthria/aphasia secondary to old CVA Increase nursing assistance for communication of needs   Bullous pemphigoid  On chronic  prednisone  and doxycycline    DVT prophylaxis:  enoxaparin  (LOVENOX ) injection 40 mg Start: 10/30/23 0800 SCDs Start: 10/29/23 1810 Place TED hose Start: 10/29/23 1810     Code Status: DNR Family Communication: Son-in-law updated at bedside Disposition Plan: Possible discharge back to [facility] in next 2 to 3 days depending on clinical condition and insurance approval   Consultants:  Orthopedics  Procedures:   left hip bipolar hemiarthroplasty on 5/8  Antimicrobials:  IV Rocephin  Oral doxycycline    Subjective:  Remains pleasantly confused.  Son-in-law at bedside.  No other issues.  Son-in-law is requesting chaplain for HCPOA paperwork  Objective: Vitals:   10/29/23 2348 10/30/23 0451 10/30/23 0829 10/30/23 1239  BP: (!) 168/81 134/61 (!) 155/61 (!) 144/60  Pulse: (!) 59 (!) 55 62 64  Resp: 18 18 16 18   Temp: (!) 97.5 F (36.4 C) (!) 97.5 F (36.4 C) 98.4 F (36.9 C) 98.3 F (36.8 C)  TempSrc: Oral Oral    SpO2: 100% 100% 100% 95%  Weight:      Height:        Intake/Output Summary (Last 24 hours) at 10/30/2023 1505 Last data filed at 10/30/2023 0439 Gross per 24 hour  Intake 1612.73 ml  Output 320 ml  Net 1292.73 ml   Filed Weights   10/28/23 2100 10/29/23 1438  Weight: 68 kg 68 kg    Examination:  General exam: Appears calm and comfortable  Respiratory system: Clear to auscultation. Respiratory effort normal. Cardiovascular system: S1 & S2 heard, RRR. No pedal edema. Gastrointestinal system: Abdomen is soft, benign Central nervous system: Alert and and awake, pleasantly confused. No focal neurological deficits.  Extremities: Left hip honeycomb dressing in place.  No drainage noted.  It is clean and dry    Data Reviewed: I have personally reviewed following labs and imaging studies  CBC: Recent Labs  Lab 10/28/23 2347 10/30/23 0528  WBC 12.8* 9.6  NEUTROABS 10.6*  --   HGB 10.0* 8.1*  HCT 29.0* 24.6*  MCV 95.7 97.2  PLT 180 132*    Basic Metabolic Panel: Recent Labs  Lab 10/28/23 2347 10/30/23 0528  NA 134* 136  K 3.0* 3.7  CL 98 101  CO2 26 25  GLUCOSE 94 143*  BUN 35* 23  CREATININE 0.88 0.82  CALCIUM  8.4* 7.9*   GFR: Estimated Creatinine Clearance: 53 mL/min (by C-G formula based on SCr of 0.82 mg/dL). Liver Function Tests: Recent Labs  Lab 10/28/23 2347  AST 20  ALT 26  ALKPHOS 109  BILITOT 1.1  PROT 5.5*  ALBUMIN 3.4*   No results for input(s): "LIPASE", "AMYLASE" in the last 168 hours. No results for input(s): "AMMONIA" in the last 168 hours. Coagulation Profile: Recent Labs  Lab 10/28/23 2347  INR 1.2     Recent Results (from the past 240 hours)  Urine Culture     Status: Abnormal (Preliminary result)   Collection Time: 10/29/23  2:19 AM   Specimen: Urine, Random  Result Value Ref Range Status   Specimen Description   Final    URINE, RANDOM Performed at [facility], [address], [place], [place] [address]    Special Requests   Final    NONE Reflexed from [id number] Performed at [facility], [address] [place], [place] [address]    Culture (A)  Final    >=100,000 COLONIES/mL PROTEUS MIRABILIS SUSCEPTIBILITIES TO FOLLOW Performed at [facility], [address] [place], [place] [address]    Report Status PENDING  Incomplete         Radiology Studies: DG HIP UNILAT W OR W/O PELVIS 2-3 VIEWS LEFT Result Date: 10/29/2023 CLINICAL DATA:  Status post left hip hemiarthroplasty. EXAM: DG HIP (WITH OR WITHOUT PELVIS) 2-3V LEFT COMPARISON:  Left hip radiographs dated 10/28/2023. FINDINGS: Status post left hip hemiarthroplasty with appropriate alignment. Right superior and inferior pubic rami fractures are again noted. Diffuse osseous demineralization. Peripheral vascular calcifications. Expected postoperative soft tissue swelling, soft tissue air, and cutaneous staples along the left hip. IMPRESSION: 1. Expected postoperative changes status post  left hip hemiarthroplasty with appropriate alignment. 2. Right superior and inferior pubic rami fractures are again noted. Electronically Signed   By: Mannie Seek M.D.   On: 10/29/2023 18:52   CT Head Wo Contrast Result Date: 10/29/2023 CLINICAL DATA:  Head trauma, minor (Age >= 65y); Neck trauma (Age >= 65y) EXAM: CT HEAD WITHOUT CONTRAST CT CERVICAL SPINE WITHOUT CONTRAST TECHNIQUE: Multidetector CT imaging of the head and cervical spine was performed following the standard protocol without intravenous contrast. Multiplanar CT image reconstructions of the cervical spine were also generated. RADIATION DOSE REDUCTION: This exam was performed according to the departmental dose-optimization program which includes automated exposure control, adjustment of the mA and/or kV according to patient size and/or use of iterative reconstruction technique. COMPARISON:  None Available. FINDINGS: CT HEAD FINDINGS Brain: No evidence of acute infarction, hemorrhage, hydrocephalus, extra-axial collection or mass lesion/mass effect. Cerebral atrophy. Vascular: No hyperdense vessel. Skull: No acute fracture. Sinuses/Orbits: Clear sinuses.  No acute orbital findings. CT CERVICAL SPINE FINDINGS Alignment: Similar mild anterolisthesis of C4 on C5. Otherwise, no substantial sagittal  subluxation. Skull base and vertebrae: No evidence of acute fracture. Mild height loss of multiple upper thoracic vertebral bodies, similar to prior. Soft tissues and spinal canal: No prevertebral fluid or swelling. No visible canal hematoma. Disc levels:  Similar multilevel degenerative change. Upper chest: Visualized lung apices are clear. IMPRESSION: No acute intracranial or cervical spine finding. Electronically Signed   By: Stevenson Elbe M.D.   On: 10/29/2023 01:15   CT Cervical Spine Wo Contrast Result Date: 10/29/2023 CLINICAL DATA:  Head trauma, minor (Age >= 65y); Neck trauma (Age >= 65y) EXAM: CT HEAD WITHOUT CONTRAST CT CERVICAL SPINE  WITHOUT CONTRAST TECHNIQUE: Multidetector CT imaging of the head and cervical spine was performed following the standard protocol without intravenous contrast. Multiplanar CT image reconstructions of the cervical spine were also generated. RADIATION DOSE REDUCTION: This exam was performed according to the departmental dose-optimization program which includes automated exposure control, adjustment of the mA and/or kV according to patient size and/or use of iterative reconstruction technique. COMPARISON:  None Available. FINDINGS: CT HEAD FINDINGS Brain: No evidence of acute infarction, hemorrhage, hydrocephalus, extra-axial collection or mass lesion/mass effect. Cerebral atrophy. Vascular: No hyperdense vessel. Skull: No acute fracture. Sinuses/Orbits: Clear sinuses.  No acute orbital findings. CT CERVICAL SPINE FINDINGS Alignment: Similar mild anterolisthesis of C4 on C5. Otherwise, no substantial sagittal subluxation. Skull base and vertebrae: No evidence of acute fracture. Mild height loss of multiple upper thoracic vertebral bodies, similar to prior. Soft tissues and spinal canal: No prevertebral fluid or swelling. No visible canal hematoma. Disc levels:  Similar multilevel degenerative change. Upper chest: Visualized lung apices are clear. IMPRESSION: No acute intracranial or cervical spine finding. Electronically Signed   By: Stevenson Elbe M.D.   On: 10/29/2023 01:15   DG Chest 1 View Result Date: 10/28/2023 CLINICAL DATA:  Fall EXAM: CHEST  1 VIEW COMPARISON:  03/16/2023 FINDINGS: Heart and mediastinal contours are within normal limits. No focal opacities or effusions. No acute bony abnormality. Aortic atherosclerosis. IMPRESSION: No active disease. Electronically Signed   By: Janeece Mechanic M.D.   On: 10/28/2023 21:42   DG Hip Unilat W or Wo Pelvis 2-3 Views Left Result Date: 10/28/2023 CLINICAL DATA:  Injury, fall. EXAM: DG HIP (WITH OR WITHOUT PELVIS) 2-3V LEFT COMPARISON:  CT of the pelvis  09/28/2023. FINDINGS: The bones are diffusely osteopenic. Right superior and inferior pubic rami fractures are again seen. Evaluation is limited secondary to patient rotation. There is and new acute fracture through the left femoral neck, nondisplaced. There is no dislocation. Joint spaces are maintained. Peripheral vascular calcifications are present. IMPRESSION: 1. Acute nondisplaced fracture through the left femoral neck. 2. Right superior and inferior pubic rami fractures are again seen. The Electronically Signed   By: Tyron Gallon M.D.   On: 10/28/2023 21:42        Scheduled Meds:  acetaminophen   500 mg Oral Q6H   docusate sodium   100 mg Oral BID   doxycycline   100 mg Oral BID   enoxaparin  (LOVENOX ) injection  40 mg Subcutaneous Q24H   feeding supplement  237 mL Oral BID BM   lisinopril   5 mg Oral Daily   potassium chloride   20 mEq Oral Daily   predniSONE   20 mg Oral Q breakfast   rosuvastatin   10 mg Oral Daily   tamsulosin   0.4 mg Oral QPC supper   Continuous Infusions:  sodium chloride  75 mL/hr at 10/30/23 0439   cefTRIAXone  (ROCEPHIN )  IV Stopped (10/30/23 0329)  LOS: 1 day    Time spent: 35 minutes    Brenna Cam, MD [facility] Pager [phone]  If 7PM-7AM, please contact night-coverage [email or web address]  10/30/2023, 3:05 PM

## 2023-10-30 NOTE — Progress Notes (Signed)
 Pt alert/ awake; pain med given x1; pt had1 cup apple sauce, antibiotic given, foley in  place,continue to monitor.

## 2023-10-30 NOTE — Plan of Care (Signed)
  Problem: Clinical Measurements: Goal: Ability to maintain clinical measurements within normal limits will improve Outcome: Progressing Goal: Will remain free from infection Outcome: Progressing Goal: Diagnostic test results will improve Outcome: Progressing Goal: Respiratory complications will improve Outcome: Progressing Goal: Cardiovascular complication will be avoided Outcome: Progressing   Problem: Activity: Goal: Risk for activity intolerance will decrease Outcome: Progressing   Problem: Nutrition: Goal: Adequate nutrition will be maintained Outcome: Progressing   Problem: Coping: Goal: Level of anxiety will decrease Outcome: Progressing   Problem: Elimination: Goal: Will not experience complications related to bowel motility Outcome: Progressing Goal: Will not experience complications related to urinary retention Outcome: Progressing   Problem: Pain Managment: Goal: General experience of comfort will improve and/or be controlled Outcome: Progressing   Problem: Safety: Goal: Ability to remain free from injury will improve Outcome: Progressing   Problem: Skin Integrity: Goal: Risk for impaired skin integrity will decrease Outcome: Progressing   Problem: Activity: Goal: Ability to ambulate and perform ADLs will improve Outcome: Progressing   Problem: Pain Management: Goal: Pain level will decrease Outcome: Progressing   Problem: Education: Goal: Knowledge of General Education information will improve Description: Including pain rating scale, medication(s)/side effects and non-pharmacologic comfort measures Outcome: Not Progressing   Problem: Health Behavior/Discharge Planning: Goal: Ability to manage health-related needs will improve Outcome: Not Progressing   Problem: Education: Goal: Verbalization of understanding the information provided (i.e., activity precautions, restrictions, etc) will improve Outcome: Not Progressing   Problem:  Self-Concept: Goal: Ability to maintain and perform role responsibilities to the fullest extent possible will improve Outcome: Not Progressing

## 2023-10-31 DIAGNOSIS — I69322 Dysarthria following cerebral infarction: Secondary | ICD-10-CM | POA: Diagnosis not present

## 2023-10-31 DIAGNOSIS — I10 Essential (primary) hypertension: Secondary | ICD-10-CM | POA: Diagnosis not present

## 2023-10-31 DIAGNOSIS — N3000 Acute cystitis without hematuria: Secondary | ICD-10-CM

## 2023-10-31 DIAGNOSIS — S72002A Fracture of unspecified part of neck of left femur, initial encounter for closed fracture: Secondary | ICD-10-CM | POA: Diagnosis not present

## 2023-10-31 LAB — CBC
HCT: 21.8 % — ABNORMAL LOW (ref 39.0–52.0)
Hemoglobin: 7.3 g/dL — ABNORMAL LOW (ref 13.0–17.0)
MCH: 32.6 pg (ref 26.0–34.0)
MCHC: 33.5 g/dL (ref 30.0–36.0)
MCV: 97.3 fL (ref 80.0–100.0)
Platelets: 123 10*3/uL — ABNORMAL LOW (ref 150–400)
RBC: 2.24 MIL/uL — ABNORMAL LOW (ref 4.22–5.81)
RDW: 13.2 % (ref 11.5–15.5)
WBC: 10.2 10*3/uL (ref 4.0–10.5)
nRBC: 0 % (ref 0.0–0.2)

## 2023-10-31 LAB — BASIC METABOLIC PANEL WITH GFR
Anion gap: 6 (ref 5–15)
BUN: 33 mg/dL — ABNORMAL HIGH (ref 8–23)
CO2: 25 mmol/L (ref 22–32)
Calcium: 7.8 mg/dL — ABNORMAL LOW (ref 8.9–10.3)
Chloride: 103 mmol/L (ref 98–111)
Creatinine, Ser: 0.88 mg/dL (ref 0.61–1.24)
GFR, Estimated: >60 mL/min (ref >60)
Glucose, Bld: 108 mg/dL — ABNORMAL HIGH (ref 70–99)
Potassium: 3.9 mmol/L (ref 3.5–5.1)
Sodium: 134 mmol/L — ABNORMAL LOW (ref 135–145)

## 2023-10-31 LAB — URINE CULTURE: Culture: >=100000 — AB

## 2023-10-31 MED ORDER — SENNOSIDES-DOCUSATE SODIUM 8.6-50 MG PO TABS
2.0000 | ORAL_TABLET | Freq: Two times a day (BID) | ORAL | Status: DC
  Administered 2023-10-31 – 2023-11-05 (×6): 2 via ORAL
  Filled 2023-10-31 (×6): qty 2

## 2023-10-31 MED ORDER — POLYETHYLENE GLYCOL 3350 17 G PO PACK
17.0000 g | PACK | Freq: Two times a day (BID) | ORAL | Status: DC
  Administered 2023-10-31 – 2023-11-05 (×5): 17 g via ORAL
  Filled 2023-10-31 (×6): qty 1

## 2023-10-31 MED ORDER — HYDROCODONE-ACETAMINOPHEN 5-325 MG PO TABS: 1.0000 | ORAL_TABLET | Freq: Four times a day (QID) | ORAL | Status: DC | PRN

## 2023-10-31 NOTE — Progress Notes (Signed)
 1      PROGRESS NOTE    Nicholas Gross  NWG:[id number] DOB: 29-Sep-1927 DOA: 10/28/2023 PCP: Solomon Dupre, DO    Brief Narrative:   89 y.o. male with medical history significant for HTN, CAD, PAD, bullous pemphigoid on chronic prednisone , CVA with residual right-sided weakness, frequent falls, chronic imbalance  recently admitted from 4/7 to 10/05/2023 with a fall with pelvic fracture and rhabdomyolysis managed nonsurgically, with stay complicated by acute urinary retention and altered mental status, discharged to SNF, who is being admitted with a left femoral neck fracture which he sustained from a likely accidental fall as he was trying to sit in his wheelchair.   5/8: Orthopedic consult, Left hip bipolar hemiarthroplasty.  5/9: PT and OT eval, HC POA paperwork 5/10: Scheduled bowel regimen added   Assessment & Plan:   Principal Problem:   Closed displaced fracture of left femoral neck (HCC) Active Problems:   Fall   Urinary retention   Urinary tract infection   Essential hypertension   CAD (coronary artery disease)   PAD (peripheral artery disease) (HCC)   Dysarthria as late effect of cerebrovascular accident (CVA)   Malnutrition of moderate degree   * Closed displaced fracture of left femoral neck (HCC) Status post left hip bipolar hemiarthroplasty on 5/8 DVT prophylaxis and pain management per Ortho   Acute urinary retention BPH Foley catheter being placed in the ED and removed on 5/9. Continue tamsulosin   Acute on chronic anemia Likely IntraOp/postop blood loss Hemoglobin 12.2 on on 09/29/2023-> 7.3 today Will transfuse 1 PRBC for hemoglobin less than 7.  No obvious GI bleed  Proteus mirabilis urinary tract infection Urinalysis consistent with UTI with positive leuks and nitrites Continue Rocephin  urine culture growing Proteus mirabilis more than 100,000 colonies   PAD (peripheral artery disease) (HCC) History of carotid endarterectomy Continue aspirin  and  rosuvastatin    CAD (coronary artery disease) No complaints of chest pain and EKG nonacute Continue lisinopril , rosuvastatin  and aspirin    Hypertension Continue lisinopril    Dysarthria/aphasia secondary to old CVA Increase nursing assistance for communication of needs   Bullous pemphigoid  On chronic prednisone  and doxycycline    DVT prophylaxis:  enoxaparin  (LOVENOX ) injection 40 mg Start: 10/30/23 0800 SCDs Start: 10/29/23 1810 Place TED hose Start: 10/29/23 1810     Code Status: DNR Family Communication: Sister updated at bedside Disposition Plan: Possible discharge back to [facility] in next 2 to 3 days depending on clinical condition and insurance approval   Consultants:  Orthopedics  Procedures:   left hip bipolar hemiarthroplasty on 5/8  Antimicrobials:  IV Rocephin  Oral doxycycline    Subjective:  Remains pleasantly confused.  Sister at bedside.  No new issues  Objective: Vitals:   10/30/23 1920 10/30/23 2330 10/31/23 0306 10/31/23 0822  BP: (!) 114/53 (!) 116/52 124/60 (!) 157/64  Pulse: 65 (!) 59 (!) 58 (!) 59  Resp: 18 16 18 16   Temp: 98.4 F (36.9 C) 98 F (36.7 C) 98.2 F (36.8 C) 98.2 F (36.8 C)  TempSrc: Oral   Oral  SpO2: 96% 94% 95% 98%  Weight:      Height:        Intake/Output Summary (Last 24 hours) at 10/31/2023 1259 Last data filed at 10/31/2023 1143 Gross per 24 hour  Intake 724.33 ml  Output 600 ml  Net 124.33 ml   Filed Weights   10/28/23 2100 10/29/23 1438  Weight: 68 kg 68 kg    Examination:  General exam: Appears calm  and comfortable  Respiratory system: Clear to auscultation. Respiratory effort normal. Cardiovascular system: S1 & S2 heard, RRR. No pedal edema. Gastrointestinal system: Abdomen is soft, benign Central nervous system: Alert and and awake, pleasantly confused. No focal neurological deficits. Extremities: Left hip honeycomb dressing in place.  No drainage noted.  It is clean and dry    Data  Reviewed: I have personally reviewed following labs and imaging studies  CBC: Recent Labs  Lab 10/28/23 2347 10/30/23 0528 10/31/23 0451  WBC 12.8* 9.6 10.2  NEUTROABS 10.6*  --   --   HGB 10.0* 8.1* 7.3*  HCT 29.0* 24.6* 21.8*  MCV 95.7 97.2 97.3  PLT 180 132* 123*   Basic Metabolic Panel: Recent Labs  Lab 10/28/23 2347 10/30/23 0528 10/31/23 0451  NA 134* 136 134*  K 3.0* 3.7 3.9  CL 98 101 103  CO2 26 25 25   GLUCOSE 94 143* 108*  BUN 35* 23 33*  CREATININE 0.88 0.82 0.88  CALCIUM  8.4* 7.9* 7.8*   GFR: Estimated Creatinine Clearance: 49.4 mL/min (by C-G formula based on SCr of 0.88 mg/dL). Liver Function Tests: Recent Labs  Lab 10/28/23 2347  AST 20  ALT 26  ALKPHOS 109  BILITOT 1.1  PROT 5.5*  ALBUMIN 3.4*   No results for input(s): "LIPASE", "AMYLASE" in the last 168 hours. No results for input(s): "AMMONIA" in the last 168 hours. Coagulation Profile: Recent Labs  Lab 10/28/23 2347  INR 1.2     Recent Results (from the past 240 hours)  Urine Culture     Status: Abnormal   Collection Time: 10/29/23  2:19 AM   Specimen: Urine, Random  Result Value Ref Range Status   Specimen Description   Final    URINE, RANDOM Performed at [facility], [address] [place], [place] [address]    Special Requests   Final    NONE Reflexed from [id number] Performed at [facility], [address] [place], [place] [address]    Culture >=100,000 COLONIES/mL PROTEUS MIRABILIS (A)  Final   Report Status 10/31/2023 FINAL  Final   Organism ID, Bacteria PROTEUS MIRABILIS (A)  Final      Susceptibility   Proteus mirabilis - MIC*    AMPICILLIN >=32 RESISTANT Resistant     CEFAZOLIN  8 SENSITIVE Sensitive     CEFEPIME <=0.12 SENSITIVE Sensitive     CEFTRIAXONE  <=0.25 SENSITIVE Sensitive     CIPROFLOXACIN  <=0.25 SENSITIVE Sensitive     GENTAMICIN <=1 SENSITIVE Sensitive     IMIPENEM 2 SENSITIVE Sensitive     NITROFURANTOIN 128 RESISTANT Resistant      TRIMETH /SULFA  >=320 RESISTANT Resistant     AMPICILLIN/SULBACTAM 16 INTERMEDIATE Intermediate     PIP/TAZO <=4 SENSITIVE Sensitive ug/mL    * >=100,000 COLONIES/mL PROTEUS MIRABILIS         Radiology Studies: DG HIP UNILAT W OR W/O PELVIS 2-3 VIEWS LEFT Result Date: 10/29/2023 CLINICAL DATA:  Status post left hip hemiarthroplasty. EXAM: DG HIP (WITH OR WITHOUT PELVIS) 2-3V LEFT COMPARISON:  Left hip radiographs dated 10/28/2023. FINDINGS: Status post left hip hemiarthroplasty with appropriate alignment. Right superior and inferior pubic rami fractures are again noted. Diffuse osseous demineralization. Peripheral vascular calcifications. Expected postoperative soft tissue swelling, soft tissue air, and cutaneous staples along the left hip. IMPRESSION: 1. Expected postoperative changes status post left hip hemiarthroplasty with appropriate alignment. 2. Right superior and inferior pubic rami fractures are again noted. Electronically Signed   By: Arcola Beards.D.  On: 10/29/2023 18:52        Scheduled Meds:  docusate sodium   100 mg Oral BID   doxycycline   100 mg Oral BID   enoxaparin  (LOVENOX ) injection  40 mg Subcutaneous Q24H   feeding supplement  237 mL Oral BID BM   lisinopril   5 mg Oral Daily   potassium chloride   20 mEq Oral Daily   predniSONE   20 mg Oral Q breakfast   rosuvastatin   10 mg Oral Daily   tamsulosin   0.4 mg Oral QPC supper   Continuous Infusions:  cefTRIAXone  (ROCEPHIN )  IV Stopped (10/31/23 0336)     LOS: 2 days    Time spent: 35 minutes    Nigeria Lasseter Mason Sole, MD [facility] Pager [phone]  If 7PM-7AM, please contact night-coverage [email or web address]  10/31/2023, 12:59 PM

## 2023-10-31 NOTE — Anesthesia Postprocedure Evaluation (Signed)
 Anesthesia Post Note  Patient: Nicholas Gross  Procedure(s) Performed: HEMIARTHROPLASTY (BIPOLAR) HIP, POSTERIOR APPROACH FOR FRACTURE (Left)  Patient location during evaluation: PACU Anesthesia Type: General Level of consciousness: awake and alert Pain management: pain level controlled Vital Signs Assessment: post-procedure vital signs reviewed and stable Respiratory status: spontaneous breathing, nonlabored ventilation, respiratory function stable and patient connected to nasal cannula oxygen Cardiovascular status: blood pressure returned to baseline and stable Postop Assessment: no apparent nausea or vomiting Anesthetic complications: no   No notable events documented.   Last Vitals:  Vitals:   10/31/23 0306 10/31/23 0822  BP: 124/60 (!) 157/64  Pulse: (!) 58 (!) 59  Resp: 18 16  Temp: 36.8 C 36.8 C  SpO2: 95% 98%    Last Pain:  Vitals:   10/31/23 0822  TempSrc: Oral  PainSc:                  Vanice Genre

## 2023-10-31 NOTE — Progress Notes (Signed)
  Subjective: 2 Days Post-Op Procedure(s) (LRB): HEMIARTHROPLASTY (BIPOLAR) HIP, POSTERIOR APPROACH FOR FRACTURE (Left) Patient is sleeping this AM, no signs of pain. Patient is well, and has had no acute complaints or problems PT and care management to assist with discharge planning.  Was at [employer] from a previous fall. Negative for chest pain and shortness of breath Fever: no Gastrointestinal:Negative for nausea and vomiting  Objective: Vital signs in last 24 hours: Temp:  [98 F (36.7 C)-98.4 F (36.9 C)] 98.2 F (36.8 C) (05/10 0306) Pulse Rate:  [58-66] 58 (05/10 0306) Resp:  [16-18] 18 (05/10 0306) BP: (114-155)/(52-61) 124/60 (05/10 0306) SpO2:  [94 %-100 %] 95 % (05/10 0306)  Intake/Output from previous day:  Intake/Output Summary (Last 24 hours) at 10/31/2023 0811 Last data filed at 10/31/2023 0404 Gross per 24 hour  Intake 484.33 ml  Output --  Net 484.33 ml    Intake/Output this shift: No intake/output data recorded.  Labs: Recent Labs    10/28/23 2347 10/30/23 0528 10/31/23 0451  HGB 10.0* 8.1* 7.3*   Recent Labs    10/30/23 0528 10/31/23 0451  WBC 9.6 10.2  RBC 2.53* 2.24*  HCT 24.6* 21.8*  PLT 132* 123*   Recent Labs    10/30/23 0528 10/31/23 0451  NA 136 134*  K 3.7 3.9  CL 101 103  CO2 25 25  BUN 23 33*  CREATININE 0.82 0.88  GLUCOSE 143* 108*  CALCIUM  7.9* 7.8*   Recent Labs    10/28/23 2347  INR 1.2     EXAM General - Patient is Alert and Appropriate.  Very sleepy this AM. Extremity - ABD soft Neurovascular intact Dorsiflexion/Plantar flexion intact Incision: dressing C/D/I No cellulitis present Dressing/Incision - clean, dry, no drainage noted to the left hip honeycomb dressing. Motor Function - intact, moving foot and toes well on exam.  Abdomen soft with intact bowel sounds this AM.  Past Medical History:  Diagnosis Date   CAD (coronary artery disease)    History of GI bleed 2008   History of  tobacco use    17 pack years, quit around 1970   Hyperlipidemia    Hypertension    Overweight     Assessment/Plan: 2 Days Post-Op Procedure(s) (LRB): HEMIARTHROPLASTY (BIPOLAR) HIP, POSTERIOR APPROACH FOR FRACTURE (Left) Principal Problem:   Closed displaced fracture of left femoral neck (HCC) Active Problems:   CAD (coronary artery disease)   PAD (peripheral artery disease) (HCC)   Fall   Urinary retention   Essential hypertension   Dysarthria as late effect of cerebrovascular accident (CVA)   Urinary tract infection   Malnutrition of moderate degree  Estimated body mass index is 22.79 kg/m as calculated from the following:   Height as of this encounter: 5\' 8"  (1.727 m).   Weight as of this encounter: 68 kg. Advance diet Up with therapy D/C IV fluids when tolerating po intake.  Labs and vitals reviewed. Hg 7.3, continue to monitor.  Transfuse if drops below 7. Up with PT. Care management to assist with d/c planning, was at [facility] prior to fall. Continue to work on a BM.  DVT Prophylaxis - Lovenox  and TED hose Weight-Bearing as tolerated to left leg  J. Edie Goon, PA-C [facility] Orthopaedic Surgery 10/31/2023, 8:11 AM

## 2023-10-31 NOTE — Evaluation (Signed)
 Clinical/Bedside Swallow Evaluation Patient Details  Name: Nicholas Gross MRN: [id number] Date of Birth: 11-Jul-1927  Today's Date: 10/31/2023 Time: SLP Start Time (ACUTE ONLY): 0930 SLP Stop Time (ACUTE ONLY): 0950 SLP Time Calculation (min) (ACUTE ONLY): 20 min  Past Medical History:  Past Medical History:  Diagnosis Date   CAD (coronary artery disease)    History of GI bleed 2008   History of tobacco use    17 pack years, quit around 1970   Hyperlipidemia    Hypertension    Overweight    Past Surgical History:  Past Surgical History:  Procedure Laterality Date   CAROTID ENDARTERECTOMY Left 2010   CATARACT EXTRACTION     cypher stent  09/12/02   s/p cypher stent mid-LAD   HIP ARTHROPLASTY Left 10/29/2023   Procedure: HEMIARTHROPLASTY (BIPOLAR) HIP, POSTERIOR APPROACH FOR FRACTURE;  Surgeon: Elner Hahn, MD;  Location: [facility] ORS;  Service: Orthopedics;  Laterality: Left;   TENDON REPAIR  1991   finger and arm   HPI:  Per MD progress note, "89 y.o. male with medical history significant for HTN, CAD, PAD, bullous pemphigoid on chronic prednisone , CVA with residual right-sided weakness, frequent falls, chronic imbalance  recently admitted from 4/7 to 10/05/2023 with a fall with pelvic fracture and rhabdomyolysis managed nonsurgically, with stay complicated by acute urinary retention and altered mental status, discharged to SNF, who is being admitted with a left femoral neck fracture which he sustained from a likely accidental fall as he was trying to sit in his wheelchair. S/p left hip bipolar hemiarthroplasty." DG Chest 10/28/23 "No active disease." Head CT 10/29/23 "no acute intracranial or cervical spine finding."    Assessment / Plan / Recommendation  Clinical Impression  Pt seen for bedside swallow assessment in the setting of known dysphagia (seen by this service from 4/8-4/13 during last admission.  Pt seen with trials of thin liquid via cup and straw, puree solids, and dys 2  (chopped consistent) trials. Pt with slight throat clear and wet vocal quality following straw sip of thin liquids. No indication of aspiration with single sips of thin liquid via cup. Therapist provided min assist for pt to hold cup to increase independence and attention to task.   Oral phase remains prolonged, with use of liquid wash to clear min oral residue with dys 2 solids. RN denying issue with completion of breakfast with current puree diet.   Current factors impacting oropharyngeal function include dentition (loose dentures in place), mentation/endurance (limited PO trials completed), and known esophageal concerns/globus sensation (refer to SLP note from 04/17/23). All factors along with current acute deconditioning increase pt risk for aspiration. Recommend continuation of puree solids and thin liquids with aspiration precautions, slow rate, small bites, elevated HOB (during and after intake). Supervision/assist for PO intake. Medications whole in thin liquids vs puree. RN and MD aware of recommendations. SLP will continue to follow per POC.  SLP Visit Diagnosis: Dysphagia, oropharyngeal phase (R13.12)    Aspiration Risk  Moderate aspiration risk    Diet Recommendation   Thin;Dysphagia 1 (puree)  Medication Administration: Whole meds with liquid (vs puree)    Other  Recommendations Recommended Consults: Consider GI evaluation (if pt report globus sensation) Oral Care Recommendations: Oral care before and after PO;Staff/trained caregiver to provide oral care    Recommendations for follow up therapy are one component of a multi-disciplinary discharge planning process, led by the attending physician.  Recommendations may be updated based on patient status, additional functional criteria and  insurance authorization.  Follow up Recommendations Follow physician's recommendations for discharge plan and follow up therapies      Assistance Recommended at Discharge  Supervision and assistance  with PO intake.   Functional Status Assessment Patient has had a recent decline in their functional status and demonstrates the ability to make significant improvements in function in a reasonable and predictable amount of time. (though appears grossly stable with last presentation)  Frequency and Duration min 2x/week  2 weeks       Prognosis Prognosis for improved oropharyngeal function: Fair Barriers to Reach Goals: Time post onset;Motivation;Cognitive deficits      Swallow Study   General Date of Onset: 10/31/23 HPI: Per MD progress note, "89 y.o. male with medical history significant for HTN, CAD, PAD, bullous pemphigoid on chronic prednisone , CVA with residual right-sided weakness, frequent falls, chronic imbalance  recently admitted from 4/7 to 10/05/2023 with a fall with pelvic fracture and rhabdomyolysis managed nonsurgically, with stay complicated by acute urinary retention and altered mental status, discharged to SNF, who is being admitted with a left femoral neck fracture which he sustained from a likely accidental fall as he was trying to sit in his wheelchair. S/p left hip bipolar hemiarthroplasty." DG Chest 10/28/23 "No active disease." Head CT 10/29/23 "no acute intracranial or cervical spine finding." Type of Study: Bedside Swallow Evaluation Previous Swallow Assessment: seen during last admission 4/8-4/13 Diet Prior to this Study: Dysphagia 1 (pureed);Thin liquids (Level 0) Temperature Spikes Noted: No (WBC 10.2) Respiratory Status: Room air History of Recent Intubation:  (for procedure only 5/8) Behavior/Cognition: Lethargic/Drowsy;Distractible Oral Cavity Assessment: Within Functional Limits Oral Care Completed by SLP: Recent completion by staff Oral Cavity - Dentition: Dentures, top;Dentures, bottom (ill fitting) Vision: Functional for self-feeding Self-Feeding Abilities: Needs assist Patient Positioning: Upright in bed Baseline Vocal Quality: Low vocal  intensity Volitional Cough: Cognitively unable to elicit Volitional Swallow: Unable to elicit    Oral/Motor/Sensory Function Overall Oral Motor/Sensory Function: Within functional limits   Ice Chips Ice chips: Not tested   Thin Liquid Thin Liquid: Impaired Presentation: Cup;Straw Oral Phase Impairments: Reduced labial seal;Poor awareness of bolus Oral Phase Functional Implications: Right anterior spillage Pharyngeal  Phase Impairments: Throat Clearing - Delayed;Wet Vocal Quality;Multiple swallows Other Comments: throat clear/wet voice noted with use of straw    Nectar Thick Nectar Thick Liquid: Not tested   Honey Thick Honey Thick Liquid: Not tested   Puree Puree: Within functional limits   Solid     Solid: Impaired Presentation: Spoon Oral Phase Impairments: Impaired mastication;Poor awareness of bolus Oral Phase Functional Implications: Oral residue;Impaired mastication;Prolonged oral transit Pharyngeal Phase Impairments:  (none)     [place] Tashari Schoenfelder Clapp, MS, CCC-SLP Speech Language Pathologist Rehab Services; [facility] [place] [phone] (ascom)   [place] J Clapp 10/31/2023,10:44 AM

## 2023-11-01 DIAGNOSIS — Z7189 Other specified counseling: Secondary | ICD-10-CM | POA: Diagnosis not present

## 2023-11-01 DIAGNOSIS — S72002A Fracture of unspecified part of neck of left femur, initial encounter for closed fracture: Secondary | ICD-10-CM | POA: Diagnosis not present

## 2023-11-01 DIAGNOSIS — E440 Moderate protein-calorie malnutrition: Secondary | ICD-10-CM | POA: Diagnosis not present

## 2023-11-01 DIAGNOSIS — I739 Peripheral vascular disease, unspecified: Secondary | ICD-10-CM

## 2023-11-01 DIAGNOSIS — R339 Retention of urine, unspecified: Secondary | ICD-10-CM | POA: Diagnosis not present

## 2023-11-01 DIAGNOSIS — I69322 Dysarthria following cerebral infarction: Secondary | ICD-10-CM | POA: Diagnosis not present

## 2023-11-01 LAB — CBC
HCT: 22.1 % — ABNORMAL LOW (ref 39.0–52.0)
Hemoglobin: 7.5 g/dL — ABNORMAL LOW (ref 13.0–17.0)
MCH: 32.6 pg (ref 26.0–34.0)
MCHC: 33.9 g/dL (ref 30.0–36.0)
MCV: 96.1 fL (ref 80.0–100.0)
Platelets: 137 10*3/uL — ABNORMAL LOW (ref 150–400)
RBC: 2.3 MIL/uL — ABNORMAL LOW (ref 4.22–5.81)
RDW: 13.2 % (ref 11.5–15.5)
WBC: 8.8 10*3/uL (ref 4.0–10.5)
nRBC: 0 % (ref 0.0–0.2)

## 2023-11-01 LAB — BASIC METABOLIC PANEL WITH GFR
Anion gap: 8 (ref 5–15)
BUN: 33 mg/dL — ABNORMAL HIGH (ref 8–23)
CO2: 24 mmol/L (ref 22–32)
Calcium: 8 mg/dL — ABNORMAL LOW (ref 8.9–10.3)
Chloride: 103 mmol/L (ref 98–111)
Creatinine, Ser: 0.83 mg/dL (ref 0.61–1.24)
GFR, Estimated: >60 mL/min (ref >60)
Glucose, Bld: 108 mg/dL — ABNORMAL HIGH (ref 70–99)
Potassium: 4.1 mmol/L (ref 3.5–5.1)
Sodium: 135 mmol/L (ref 135–145)

## 2023-11-01 MED ORDER — HYDROCODONE-ACETAMINOPHEN 5-325 MG PO TABS
1.0000 | ORAL_TABLET | Freq: Four times a day (QID) | ORAL | Status: DC | PRN
  Administered 2023-11-05: 1 via ORAL
  Filled 2023-11-01: qty 1

## 2023-11-01 NOTE — TOC Progression Note (Signed)
 Transition of Care [facility]) - Progression Note    Patient Details  Name: Nicholas Gross MRN: [id number] Date of Birth: [birth year]  [facility] CM/SW Contact  Marino Sias, RN Phone Number: 11/01/2023, 12:28 PM  Clinical Narrative:  MD spoke with POA and [facility] is preference SNF with Hospice Care. Referral pending review at this time. CM to follow up tomorrow, 11/02/23.          Expected Discharge Plan and Services                                               Social Determinants of Health (SDOH) Interventions SDOH Screenings   Food Insecurity: No Food Insecurity (10/29/2023)  Housing: Low Risk  (10/29/2023)  Transportation Needs: No Transportation Needs (10/29/2023)  Utilities: Not At Risk (10/29/2023)  Alcohol  Screen: Low Risk  (05/13/2021)  Depression (PHQ2-9): Low Risk  (07/06/2023)  Recent Concern: Depression (PHQ2-9) - Medium Risk (06/01/2023)  Financial Resource Strain: Low Risk  (04/20/2023)   Received from [facility]  Physical Activity: Unknown (05/13/2021)  Social Connections: Socially Isolated (10/29/2023)  Stress: No Stress Concern Present (05/13/2021)  Tobacco Use: Medium Risk (10/29/2023)    Readmission Risk Interventions     No data to display

## 2023-11-01 NOTE — Care Management Important Message (Signed)
 Important Message  Patient Details  Name: RUEL TOSI MRN: [id number] Date of Birth: 10/01/1927   Important Message Given:  Yes - Medicare IM     Anise Kerns 11/01/2023, 2:05 PM

## 2023-11-01 NOTE — IPAL (Signed)
  Interdisciplinary Goals of Care Family Meeting   Date carried out: 11/01/2023  Location of the meeting: Phone conference  Member's involved: Physician and Family Member or next of kin  Durable Power of Attorney or Environmental health practitioner: next of kin/niece/Tammy/HCPOA  Discussion: We discussed goals of care for [employer]   Code status:   Code Status: Limited: Do not attempt resuscitation (DNR) -DNR-LIMITED -Do Not Intubate/DNI    Disposition: Home with Hospice  Time spent for the meeting: 35 minutes    Brenna Cam, MD  11/01/2023, 11:54 AM

## 2023-11-01 NOTE — Progress Notes (Signed)
 1      PROGRESS NOTE    Nicholas Gross  ZOX:[id number] DOB: 10-24-1927 DOA: 10/28/2023 PCP: Solomon Dupre, DO    Brief Narrative:   89 y.o. male with medical history significant for HTN, CAD, PAD, bullous pemphigoid on chronic prednisone , CVA with residual right-sided weakness, frequent falls, chronic imbalance  recently admitted from 4/7 to 10/05/2023 with a fall with pelvic fracture and rhabdomyolysis managed nonsurgically, with stay complicated by acute urinary retention and altered mental status, discharged to SNF, who is being admitted with a left femoral neck fracture which he sustained from a likely accidental fall as he was trying to sit in his wheelchair.   5/8: Orthopedic consult, Left hip bipolar hemiarthroplasty.  5/9: PT and OT eval, HC POA paperwork 5/10: Scheduled bowel regimen added 5/11: Palliative care consult.  Family interested in hospice   Assessment & Plan:   Principal Problem:   Closed displaced fracture of left femoral neck (HCC) Active Problems:   Fall   Urinary retention   Urinary tract infection   Essential hypertension   CAD (coronary artery disease)   PAD (peripheral artery disease) (HCC)   Dysarthria as late effect of cerebrovascular accident (CVA)   Malnutrition of moderate degree   * Closed displaced fracture of left femoral neck (HCC) Status post left hip bipolar hemiarthroplasty on 5/8 DVT prophylaxis and pain management per Ortho.  Stopping morphine    Acute urinary retention BPH Foley catheter being placed in the ED and removed on 5/9. Continue tamsulosin   Acute on chronic anemia Likely IntraOp/postop blood loss Hemoglobin 12.2 on on 09/29/2023-> 7.5 today Will transfuse 1 PRBC for hemoglobin less than 7.  No obvious GI bleed  Proteus mirabilis urinary tract infection Urinalysis consistent with UTI with positive leuks and nitrites Continue Rocephin .  This can be stopped at discharge urine culture growing Proteus mirabilis more than  100,000 colonies   PAD (peripheral artery disease) (HCC) History of carotid endarterectomy Continue aspirin  and rosuvastatin    CAD (coronary artery disease) No complaints of chest pain and EKG nonacute Continue lisinopril , rosuvastatin  and aspirin    Hypertension Continue lisinopril    Dysarthria/aphasia secondary to old CVA Increase nursing assistance for communication of needs   Bullous pemphigoid  On chronic prednisone  and doxycycline   Goals of care Palliative care consult.  Potentially hospice at [facility] if he qualifies  DVT prophylaxis:  enoxaparin  (LOVENOX ) injection 40 mg Start: 10/30/23 0800 SCDs Start: 10/29/23 1810 Place TED hose Start: 10/29/23 1810     Code Status: DNR Family Communication: Sister updated at bedside Disposition Plan: Possible discharge back to [facility] in next 1-2 days depending on clinical condition and insurance approval   Consultants:  Orthopedics  Procedures:   left hip bipolar hemiarthroplasty on 5/8  Antimicrobials:  IV Rocephin  Oral doxycycline    Subjective:  Remains pleasantly confused.  Sister at bedside.  No new issues  Objective: Vitals:   10/31/23 1521 10/31/23 2024 11/01/23 0448 11/01/23 0920  BP: (!) 123/52 (!) 115/49 (!) 149/57 (!) 170/69  Pulse: 66 66 (!) 57 89  Resp: 16 16 18 20   Temp: 98.8 F (37.1 C) 98.1 F (36.7 C) 98.5 F (36.9 C) 98.5 F (36.9 C)  TempSrc: Oral Oral Oral Oral  SpO2: 95% 97% 99% 100%  Weight:      Height:        Intake/Output Summary (Last 24 hours) at 11/01/2023 1141 Last data filed at 11/01/2023 1100 Gross per 24 hour  Intake 460 ml  Output  1600 ml  Net -1140 ml   Filed Weights   10/28/23 2100 10/29/23 1438  Weight: 68 kg 68 kg    Examination:  General exam: Appears calm and comfortable  Respiratory system: Clear to auscultation. Respiratory effort normal. Cardiovascular system: S1 & S2 heard, RRR. No pedal edema. Gastrointestinal system: Abdomen is soft,  benign Central nervous system: Alert and and awake, pleasantly confused. No focal neurological deficits. Extremities: Left hip honeycomb dressing in place.  No drainage noted.  It is clean and dry    Data Reviewed: I have personally reviewed following labs and imaging studies  CBC: Recent Labs  Lab 10/28/23 2347 10/30/23 0528 10/31/23 0451 11/01/23 0503  WBC 12.8* 9.6 10.2 8.8  NEUTROABS 10.6*  --   --   --   HGB 10.0* 8.1* 7.3* 7.5*  HCT 29.0* 24.6* 21.8* 22.1*  MCV 95.7 97.2 97.3 96.1  PLT 180 132* 123* 137*   Basic Metabolic Panel: Recent Labs  Lab 10/28/23 2347 10/30/23 0528 10/31/23 0451 11/01/23 0503  NA 134* 136 134* 135  K 3.0* 3.7 3.9 4.1  CL 98 101 103 103  CO2 26 25 25 24   GLUCOSE 94 143* 108* 108*  BUN 35* 23 33* 33*  CREATININE 0.88 0.82 0.88 0.83  CALCIUM  8.4* 7.9* 7.8* 8.0*   GFR: Estimated Creatinine Clearance: 52.3 mL/min (by C-G formula based on SCr of 0.83 mg/dL). Liver Function Tests: Recent Labs  Lab 10/28/23 2347  AST 20  ALT 26  ALKPHOS 109  BILITOT 1.1  PROT 5.5*  ALBUMIN 3.4*   No results for input(s): "LIPASE", "AMYLASE" in the last 168 hours. No results for input(s): "AMMONIA" in the last 168 hours. Coagulation Profile: Recent Labs  Lab 10/28/23 2347  INR 1.2     Recent Results (from the past 240 hours)  Urine Culture     Status: Abnormal   Collection Time: 10/29/23  2:19 AM   Specimen: Urine, Random  Result Value Ref Range Status   Specimen Description   Final    URINE, RANDOM Performed at [facility], [address] [place], [place] [address]    Special Requests   Final    NONE Reflexed from [id number] Performed at [facility], [address] [place], [place] [address]    Culture >=100,000 COLONIES/mL PROTEUS MIRABILIS (A)  Final   Report Status 10/31/2023 FINAL  Final   Organism ID, Bacteria PROTEUS MIRABILIS (A)  Final      Susceptibility   Proteus mirabilis - MIC*    AMPICILLIN >=32  RESISTANT Resistant     CEFAZOLIN  8 SENSITIVE Sensitive     CEFEPIME <=0.12 SENSITIVE Sensitive     CEFTRIAXONE  <=0.25 SENSITIVE Sensitive     CIPROFLOXACIN  <=0.25 SENSITIVE Sensitive     GENTAMICIN <=1 SENSITIVE Sensitive     IMIPENEM 2 SENSITIVE Sensitive     NITROFURANTOIN 128 RESISTANT Resistant     TRIMETH /SULFA  >=320 RESISTANT Resistant     AMPICILLIN/SULBACTAM 16 INTERMEDIATE Intermediate     PIP/TAZO <=4 SENSITIVE Sensitive ug/mL    * >=100,000 COLONIES/mL PROTEUS MIRABILIS         Radiology Studies: No results found.       Scheduled Meds:  doxycycline   100 mg Oral BID   enoxaparin  (LOVENOX ) injection  40 mg Subcutaneous Q24H   feeding supplement  237 mL Oral BID BM   lisinopril   5 mg Oral Daily   polyethylene glycol  17 g Oral BID   potassium chloride   20 mEq Oral Daily   predniSONE   20 mg Oral Q breakfast   rosuvastatin   10 mg Oral Daily   senna-docusate  2 tablet Oral BID   tamsulosin   0.4 mg Oral QPC supper   Continuous Infusions:  cefTRIAXone  (ROCEPHIN )  IV 1 g (11/01/23 0445)     LOS: 3 days    Time spent: 35 minutes    Kaitlynne Wenz Mason Sole, MD [facility] Pager [phone]  If 7PM-7AM, please contact night-coverage [email or web address]  11/01/2023, 11:41 AM

## 2023-11-01 NOTE — Progress Notes (Signed)
 Subjective: 3 Days Post-Op Procedure(s) (LRB): HEMIARTHROPLASTY (BIPOLAR) HIP, POSTERIOR APPROACH FOR FRACTURE (Left) Patient is sleeping this AM, no signs of pain. Patient is well, and has had no acute complaints or problems PT and care management to assist with discharge planning.  Was at [facility] [employer] from a previous fall. Negative for chest pain and shortness of breath Fever: no Gastrointestinal:Negative for nausea and vomiting  Objective: Vital signs in last 24 hours: Temp:  [98.1 F (36.7 C)-98.8 F (37.1 C)] 98.5 F (36.9 C) (05/11 0920) Pulse Rate:  [57-89] 89 (05/11 0920) Resp:  [16-20] 20 (05/11 0920) BP: (115-170)/(49-69) 170/69 (05/11 0920) SpO2:  [95 %-100 %] 100 % (05/11 0920)  Intake/Output from previous day:  Intake/Output Summary (Last 24 hours) at 11/01/2023 1106 Last data filed at 11/01/2023 1100 Gross per 24 hour  Intake 460 ml  Output 1600 ml  Net -1140 ml    Intake/Output this shift: Total I/O In: 360 [P.O.:360] Out: 400 [Urine:400]  Labs: Recent Labs    10/30/23 0528 10/31/23 0451 11/01/23 0503  HGB 8.1* 7.3* 7.5*   Recent Labs    10/31/23 0451 11/01/23 0503  WBC 10.2 8.8  RBC 2.24* 2.30*  HCT 21.8* 22.1*  PLT 123* 137*   Recent Labs    10/31/23 0451 11/01/23 0503  NA 134* 135  K 3.9 4.1  CL 103 103  CO2 25 24  BUN 33* 33*  CREATININE 0.88 0.83  GLUCOSE 108* 108*  CALCIUM  7.8* 8.0*   No results for input(s): "LABPT", "INR" in the last 72 hours.    EXAM General - Patient is Alert and Appropriate.  Very sleepy this AM. Extremity - ABD soft Neurovascular intact Dorsiflexion/Plantar flexion intact Incision: dressing C/D/I No cellulitis present Dressing/Incision - clean, dry, no drainage noted to the left hip honeycomb dressing. Motor Function - intact, moving foot and toes well on exam.  Abdomen soft with intact bowel sounds this AM.  Past Medical History:  Diagnosis Date   CAD (coronary artery disease)     History of GI bleed 2008   History of tobacco use    17 pack years, quit around 1970   Hyperlipidemia    Hypertension    Overweight     Assessment/Plan: 3 Days Post-Op Procedure(s) (LRB): HEMIARTHROPLASTY (BIPOLAR) HIP, POSTERIOR APPROACH FOR FRACTURE (Left) Principal Problem:   Closed displaced fracture of left femoral neck (HCC) Active Problems:   CAD (coronary artery disease)   PAD (peripheral artery disease) (HCC)   Fall   Urinary retention   Essential hypertension   Dysarthria as late effect of cerebrovascular accident (CVA)   Urinary tract infection   Malnutrition of moderate degree  Estimated body mass index is 22.79 kg/m as calculated from the following:   Height as of this encounter: 5\' 8"  (1.727 m).   Weight as of this encounter: 68 kg. Advance diet Up with therapy D/C IV fluids when tolerating po intake.  Labs and vitals reviewed. Hg 7.5, continue to monitor.  Transfuse if drops below 7. WBC 8.8 this morning. Up with PT. Care management to assist with d/c planning, was at [facility] prior to fall. Patient has had a BM.  DVT Prophylaxis - Lovenox  and TED hose Weight-Bearing as tolerated to left leg  J. Edie Goon, PA-C [facility] Orthopaedic Surgery 11/01/2023, 11:06 AM

## 2023-11-02 DIAGNOSIS — I25118 Atherosclerotic heart disease of native coronary artery with other forms of angina pectoris: Secondary | ICD-10-CM | POA: Diagnosis not present

## 2023-11-02 DIAGNOSIS — Z515 Encounter for palliative care: Secondary | ICD-10-CM

## 2023-11-02 DIAGNOSIS — R339 Retention of urine, unspecified: Secondary | ICD-10-CM | POA: Diagnosis not present

## 2023-11-02 DIAGNOSIS — S72002A Fracture of unspecified part of neck of left femur, initial encounter for closed fracture: Secondary | ICD-10-CM | POA: Diagnosis not present

## 2023-11-02 DIAGNOSIS — I10 Essential (primary) hypertension: Secondary | ICD-10-CM | POA: Diagnosis not present

## 2023-11-02 DIAGNOSIS — E440 Moderate protein-calorie malnutrition: Secondary | ICD-10-CM | POA: Diagnosis not present

## 2023-11-02 DIAGNOSIS — W19XXXA Unspecified fall, initial encounter: Secondary | ICD-10-CM | POA: Diagnosis not present

## 2023-11-02 LAB — CBC
HCT: 22.6 % — ABNORMAL LOW (ref 39.0–52.0)
Hemoglobin: 7.5 g/dL — ABNORMAL LOW (ref 13.0–17.0)
MCH: 32.6 pg (ref 26.0–34.0)
MCHC: 33.2 g/dL (ref 30.0–36.0)
MCV: 98.3 fL (ref 80.0–100.0)
Platelets: 136 10*3/uL — ABNORMAL LOW (ref 150–400)
RBC: 2.3 MIL/uL — ABNORMAL LOW (ref 4.22–5.81)
RDW: 13.2 % (ref 11.5–15.5)
WBC: 6.8 10*3/uL (ref 4.0–10.5)
nRBC: 0 % (ref 0.0–0.2)

## 2023-11-02 LAB — BASIC METABOLIC PANEL WITH GFR
Anion gap: 4 — ABNORMAL LOW (ref 5–15)
BUN: 31 mg/dL — ABNORMAL HIGH (ref 8–23)
CO2: 22 mmol/L (ref 22–32)
Calcium: 7.9 mg/dL — ABNORMAL LOW (ref 8.9–10.3)
Chloride: 109 mmol/L (ref 98–111)
Creatinine, Ser: 0.77 mg/dL (ref 0.61–1.24)
GFR, Estimated: >60 mL/min (ref >60)
Glucose, Bld: 101 mg/dL — ABNORMAL HIGH (ref 70–99)
Potassium: 4.3 mmol/L (ref 3.5–5.1)
Sodium: 135 mmol/L (ref 135–145)

## 2023-11-02 MED ORDER — HYDROCODONE-ACETAMINOPHEN 5-325 MG PO TABS: 1.0000 | ORAL_TABLET | Freq: Four times a day (QID) | ORAL | 0 refills | Status: DC | PRN

## 2023-11-02 MED ORDER — ENOXAPARIN SODIUM 40 MG/0.4ML IJ SOSY: 40.0000 mg | PREFILLED_SYRINGE | INTRAMUSCULAR | 0 refills | Status: DC

## 2023-11-02 NOTE — Progress Notes (Signed)
 1      PROGRESS NOTE    Nicholas Gross  VHQ:[id number] DOB: 24-May-1928 DOA: 10/28/2023 PCP: Solomon Dupre, DO    Brief Narrative:   89 y.o. male with medical history significant for HTN, CAD, PAD, bullous pemphigoid on chronic prednisone , CVA with residual right-sided weakness, frequent falls, chronic imbalance  recently admitted from 4/7 to 10/05/2023 with a fall with pelvic fracture and rhabdomyolysis managed nonsurgically, with stay complicated by acute urinary retention and altered mental status, discharged to SNF, who is being admitted with a left femoral neck fracture which he sustained from a likely accidental fall as he was trying to sit in his wheelchair.   5/8: Orthopedic consult, Left hip bipolar hemiarthroplasty.  5/9: PT and OT eval, HC POA paperwork 5/10: Scheduled bowel regimen added 5/11: Palliative care consult.  5/12: Diet advanced to dysphagia 3.  Work with PT and OT.  Waiting for placement   Assessment & Plan:   Principal Problem:   Closed displaced fracture of left femoral neck (HCC) Active Problems:   Fall   Urinary retention   Urinary tract infection   Essential hypertension   CAD (coronary artery disease)   Goals of care, counseling/discussion   PAD (peripheral artery disease) (HCC)   Dysarthria as late effect of cerebrovascular accident (CVA)   Malnutrition of moderate degree   * Closed displaced fracture of left femoral neck (HCC) Status post left hip bipolar hemiarthroplasty on 5/8 DVT prophylaxis and pain management per Ortho.  Stopping morphine .  He had a bowel movement today   Acute urinary retention BPH Foley catheter being placed in the ED and removed on 5/9. Continue tamsulosin .  Purewick in place  Acute on chronic anemia Likely IntraOp/postop blood loss Hemoglobin 12.2 on on 09/29/2023-> 7.5 today Will transfuse 1 PRBC for hemoglobin less than 7.  No obvious GI bleed  Proteus mirabilis urinary tract infection Urinalysis consistent  with UTI with positive leuks and nitrites Continue Rocephin .  This can be stopped at discharge urine culture growing Proteus mirabilis more than 100,000 colonies   PAD (peripheral artery disease) (HCC) History of carotid endarterectomy Continue aspirin  and rosuvastatin    CAD (coronary artery disease) No complaints of chest pain and EKG nonacute Continue lisinopril , rosuvastatin  and aspirin    Hypertension Continue lisinopril    Dysarthria/aphasia secondary to old CVA Increase nursing assistance for communication of needs   Bullous pemphigoid  On chronic prednisone  and doxycycline   Goals of care Palliative care consult.  Potentially hospice at [facility] if he qualifies and if family in agreement  DVT prophylaxis:  enoxaparin  (LOVENOX ) injection 40 mg Start: 10/30/23 0800 SCDs Start: 10/29/23 1810 Place TED hose Start: 10/29/23 1810     Code Status: DNR Family Communication: Niece and her husband are updated at bedside.  Sister also at bedside Disposition Plan: Possible discharge back [facility] in next 1-2 days depending on clinical condition and insurance approval   Consultants:  Orthopedics  Procedures:   left hip bipolar hemiarthroplasty on 5/8  Antimicrobials:  IV Rocephin  Oral doxycycline    Subjective:  Patient is motivated to work with therapy.  Needs and her husband are concerned about his mobility and recurrent fall.  They would like for him to work with therapy while here  Objective: Vitals:   11/01/23 1957 11/02/23 0435 11/02/23 0814 11/02/23 1239  BP: 139/61 (!) 144/57 (!) 160/72 137/61  Pulse: 63 (!) 58 (!) 56 69  Resp: 18 18 16 18   Temp: 98.3 F (36.8 C)  98.2 F (36.8 C) 98.2 F (36.8 C) 97.8 F (36.6 C)  TempSrc:  Oral    SpO2: 99% 97% 99% 100%  Weight:      Height:        Intake/Output Summary (Last 24 hours) at 11/02/2023 1510 Last data filed at 11/02/2023 1246 Gross per 24 hour  Intake 810 ml  Output 1750 ml  Net -940  ml   Filed Weights   10/28/23 2100 10/29/23 1438  Weight: 68 kg 68 kg    Examination:  General exam: Appears calm and comfortable  Respiratory system: Clear to auscultation. Respiratory effort normal. Cardiovascular system: S1 & S2 heard, RRR. No pedal edema. Gastrointestinal system: Abdomen is soft, benign Central nervous system: Alert and and awake, residual right-sided weakness from previous stroke Extremities: Left hip honeycomb dressing in place.  No drainage noted.  It is clean and dry    Data Reviewed: I have personally reviewed following labs and imaging studies  CBC: Recent Labs  Lab 10/28/23 2347 10/30/23 0528 10/31/23 0451 11/01/23 0503 11/02/23 0455  WBC 12.8* 9.6 10.2 8.8 6.8  NEUTROABS 10.6*  --   --   --   --   HGB 10.0* 8.1* 7.3* 7.5* 7.5*  HCT 29.0* 24.6* 21.8* 22.1* 22.6*  MCV 95.7 97.2 97.3 96.1 98.3  PLT 180 132* 123* 137* 136*   Basic Metabolic Panel: Recent Labs  Lab 10/28/23 2347 10/30/23 0528 10/31/23 0451 11/01/23 0503 11/02/23 0455  NA 134* 136 134* 135 135  K 3.0* 3.7 3.9 4.1 4.3  CL 98 101 103 103 109  CO2 26 25 25 24 22   GLUCOSE 94 143* 108* 108* 101*  BUN 35* 23 33* 33* 31*  CREATININE 0.88 0.82 0.88 0.83 0.77  CALCIUM  8.4* 7.9* 7.8* 8.0* 7.9*   GFR: Estimated Creatinine Clearance: 54.3 mL/min (by C-G formula based on SCr of 0.77 mg/dL). Liver Function Tests: Recent Labs  Lab 10/28/23 2347  AST 20  ALT 26  ALKPHOS 109  BILITOT 1.1  PROT 5.5*  ALBUMIN 3.4*   No results for input(s): "LIPASE", "AMYLASE" in the last 168 hours. No results for input(s): "AMMONIA" in the last 168 hours. Coagulation Profile: Recent Labs  Lab 10/28/23 2347  INR 1.2     Recent Results (from the past 240 hours)  Urine Culture     Status: Abnormal   Collection Time: 10/29/23  2:19 AM   Specimen: Urine, Random  Result Value Ref Range Status   Specimen Description   Final    URINE, RANDOM Performed at [facility], [address] [place], [place] [address]    Special Requests   Final    NONE Reflexed from [id number] Performed at [facility], [address] [place], [place] [address]    Culture >=100,000 COLONIES/mL PROTEUS MIRABILIS (A)  Final   Report Status 10/31/2023 FINAL  Final   Organism ID, Bacteria PROTEUS MIRABILIS (A)  Final      Susceptibility   Proteus mirabilis - MIC*    AMPICILLIN >=32 RESISTANT Resistant     CEFAZOLIN  8 SENSITIVE Sensitive     CEFEPIME <=0.12 SENSITIVE Sensitive     CEFTRIAXONE  <=0.25 SENSITIVE Sensitive     CIPROFLOXACIN  <=0.25 SENSITIVE Sensitive     GENTAMICIN <=1 SENSITIVE Sensitive     IMIPENEM 2 SENSITIVE Sensitive     NITROFURANTOIN 128 RESISTANT Resistant     TRIMETH /SULFA  >=320 RESISTANT Resistant     AMPICILLIN/SULBACTAM 16 INTERMEDIATE Intermediate  PIP/TAZO <=4 SENSITIVE Sensitive ug/mL    * >=100,000 COLONIES/mL PROTEUS MIRABILIS         Radiology Studies: No results found.       Scheduled Meds:  doxycycline   100 mg Oral BID   enoxaparin  (LOVENOX ) injection  40 mg Subcutaneous Q24H   feeding supplement  237 mL Oral BID BM   lisinopril   5 mg Oral Daily   polyethylene glycol  17 g Oral BID   potassium chloride   20 mEq Oral Daily   predniSONE   20 mg Oral Q breakfast   rosuvastatin   10 mg Oral Daily   senna-docusate  2 tablet Oral BID   tamsulosin   0.4 mg Oral QPC supper   Continuous Infusions:  cefTRIAXone  (ROCEPHIN )  IV Stopped (11/02/23 0450)     LOS: 4 days    Time spent: 35 minutes    Dondre Catalfamo Mason Sole, MD [facility] Pager [phone]  If 7PM-7AM, please contact night-coverage [email or web address]  11/02/2023, 3:10 PM

## 2023-11-02 NOTE — Progress Notes (Addendum)
 Subjective: 4 Days Post-Op Procedure(s) (LRB): HEMIARTHROPLASTY (BIPOLAR) HIP, POSTERIOR APPROACH FOR FRACTURE (Left) Patient is sleeping this AM, no signs of pain. Patient is well, and has had no acute complaints or problems PT and care management to assist with discharge planning.  Current plan is for SNF with hospice care. Negative for chest pain and shortness of breath Fever: no Gastrointestinal:Negative for nausea and vomiting  Objective: Vital signs in last 24 hours: Temp:  [97.6 F (36.4 C)-98.5 F (36.9 C)] 98.2 F (36.8 C) (05/12 0435) Pulse Rate:  [58-89] 58 (05/12 0435) Resp:  [16-20] 18 (05/12 0435) BP: (139-170)/(57-69) 144/57 (05/12 0435) SpO2:  [97 %-100 %] 97 % (05/12 0435)  Intake/Output from previous day:  Intake/Output Summary (Last 24 hours) at 11/02/2023 0738 Last data filed at 11/02/2023 1610 Gross per 24 hour  Intake 1290 ml  Output 1200 ml  Net 90 ml    Intake/Output this shift: No intake/output data recorded.  Labs: Recent Labs    10/31/23 0451 11/01/23 0503 11/02/23 0455  HGB 7.3* 7.5* 7.5*   Recent Labs    11/01/23 0503 11/02/23 0455  WBC 8.8 6.8  RBC 2.30* 2.30*  HCT 22.1* 22.6*  PLT 137* 136*   Recent Labs    11/01/23 0503 11/02/23 0455  NA 135 135  K 4.1 4.3  CL 103 109  CO2 24 22  BUN 33* 31*  CREATININE 0.83 0.77  GLUCOSE 108* 101*  CALCIUM  8.0* 7.9*   No results for input(s): "LABPT", "INR" in the last 72 hours.    EXAM General - Patient is Alert.  Very sleepy this AM. Extremity - ABD soft Neurovascular intact Dorsiflexion/Plantar flexion intact Incision: dressing C/D/I No cellulitis present Dressing/Incision - clean, dry, no drainage noted to the left hip honeycomb dressing. Motor Function - intact, moving foot and toes well on exam.  Abdomen soft with intact bowel sounds this AM.  Past Medical History:  Diagnosis Date   CAD (coronary artery disease)    History of GI bleed 2008   History of tobacco use     17 pack years, quit around 1970   Hyperlipidemia    Hypertension    Overweight     Assessment/Plan: 4 Days Post-Op Procedure(s) (LRB): HEMIARTHROPLASTY (BIPOLAR) HIP, POSTERIOR APPROACH FOR FRACTURE (Left) Principal Problem:   Closed displaced fracture of left femoral neck (HCC) Active Problems:   CAD (coronary artery disease)   Goals of care, counseling/discussion   PAD (peripheral artery disease) (HCC)   Fall   Urinary retention   Essential hypertension   Dysarthria as late effect of cerebrovascular accident (CVA)   Urinary tract infection   Malnutrition of moderate degree  Estimated body mass index is 22.79 kg/m as calculated from the following:   Height as of this encounter: 5\' 8"  (1.727 m).   Weight as of this encounter: 68 kg. Advance diet Up with therapy D/C IV fluids when tolerating po intake.  Labs and vitals reviewed. Hg 7.5, continue to monitor.  Transfuse if drops below 7. WBC 6.8 this morning. Up with PT.  Current plan is for d/c to SNF with hospice care. Care management to assist with d/c planning, [facility] to [facility]. Patient has had a BM.  Staples can be removed by SNF on 11/12/23. Continue Lovenox  40mg  daily for 14 days for DVT prophylaxis. Follow-up with [facility] in 6 weeks for x-rays of the left hip.  DVT Prophylaxis - Lovenox  and TED hose Weight-Bearing as tolerated to left  leg  J. Edie Goon, PA-C [facility] 11/02/2023, 7:38 AM

## 2023-11-02 NOTE — Progress Notes (Signed)
 Physical Therapy Treatment Patient Details Name: Nicholas Gross MRN: [id number] DOB: 1927/08/30 Today's Date: 11/02/2023   History of Present Illness Nicholas Sly. Gross is 89 y.o. male who lost his balance and fell trying to sit down in his wheelchair.  Pt is s/p L hemiarthroplasty on 10/29/23.  PMH: CAD, HLD, PAD. Workup revealing of subactue Lt 7th rib fracture. Head/cervical CT negative.    PT Comments  Pt received in Semi-Fowler's position and agreeable to therapy.  [name] niece in room upon arrival to the room.  [name] niece asking if pt had been seen over the weekend, and telling therapist that she was concerned that pt was not well/strong enough to go to rehab facility.  Niece was educated on role of therapy in hospital setting as well as discharge planning and the role of SNF in order to assist pt with transitioning back to prior level of function.  Pt did perform STS x2 with the first attempt being the most prolonged, ~3 minutes of standing, however had a bowel movement and nursing had to come to the room to assist.  Pt cleaned up while in standing position the second attempt and then transferred back to the bed and scooted back to [facility] by therapist and nursing.  Pt left with clean linens and gown and all other needs met and call bell within reach.      If plan is discharge home, recommend the following: Two people to help with walking and/or transfers;A lot of help with bathing/dressing/bathroom;Help with stairs or ramp for entrance;Assist for transportation   Can travel by private vehicle        Equipment Recommendations  None recommended by PT    Recommendations for Other Services       Precautions / Restrictions Precautions Precautions: Fall;Posterior Hip Recall of Precautions/Restrictions: Impaired Restrictions Weight Bearing Restrictions Per Provider Order: Yes RLE Weight Bearing Per Provider Order: Weight bearing as tolerated LLE Weight Bearing Per Provider Order: Weight  bearing as tolerated     Mobility  Bed Mobility Overal bed mobility: Needs Assistance Bed Mobility: Supine to Sit     Supine to sit: Mod assist, HOB elevated, Used rails          Transfers Overall transfer level: Needs assistance Equipment used: Rolling walker (2 wheels) Transfers: Sit to/from Stand Sit to Stand: Mod assist, From elevated surface           General transfer comment: slightly elevated bed due to pt's height; performed STS x2 trials.  Pt stood for ~3 minutes on the first attempt however had a bowel movement at that time.    Ambulation/Gait               General Gait Details: No formal gait due to faitgue from prolonged standing on both attempts.   Stairs             Wheelchair Mobility     Tilt Bed    Modified Rankin (Stroke Patients Only)       Balance Overall balance assessment: Needs assistance Sitting-balance support: No upper extremity supported, Feet supported Sitting balance-[name]: Fair Sitting balance - Comments: slight R lateral lean (hx of R sided weakness from old CVA);   Standing balance support: Bilateral upper extremity supported Standing balance-[name]: Poor                              Communication Communication Communication: Impaired Factors Affecting Communication:  Difficulty expressing self  Cognition Arousal: Alert Behavior During Therapy: Flat affect                             Following commands: Intact      Cueing Cueing Techniques: Verbal cues, Gestural cues, Tactile cues  Exercises      General Comments        Pertinent Vitals/Pain Pain Assessment Pain Assessment: Faces Faces Pain Scale: Hurts even more Pain Location: L LE when mobilizing Pain Descriptors / Indicators: Other (Comment) Pain Intervention(s): Limited activity within patient's tolerance, Monitored during session, Premedicated before session, Repositioned    Home Living                           Prior Function            PT Goals (current goals can now be found in the care plan section) Acute Rehab PT Goals Patient Stated Goal: unable to establish due to pt having difficulty expressing himself PT Goal Formulation: With patient/family Time For Goal Achievement: 11/13/23 Progress towards PT goals: Progressing toward goals    Frequency    Min 2X/week      PT Plan      Co-evaluation              AM-PAC PT "6 Clicks" Mobility   Outcome Measure  Help needed turning from your back to your side while in a flat bed without using bedrails?: A Lot Help needed moving from lying on your back to sitting on the side of a flat bed without using bedrails?: A Lot Help needed moving to and from a bed to a chair (including a wheelchair)?: A Lot Help needed standing up from a chair using your arms (e.g., wheelchair or bedside chair)?: A Lot Help needed to walk in hospital room?: Total Help needed climbing 3-5 steps with a railing? : Total 6 Click Score: 10    End of Session   Activity Tolerance: Patient tolerated treatment well Patient left: in bed;with call bell/phone within reach;with bed alarm set Nurse Communication: Mobility status PT Visit Diagnosis: Unsteadiness on feet (R26.81);Other abnormalities of gait and mobility (R26.89);Muscle weakness (generalized) (M62.81);Difficulty in walking, not elsewhere classified (R26.2)     Time: 2952-8413 PT Time Calculation (min) (ACUTE ONLY): 39 min  Charges:    $Therapeutic Activity: 38-52 mins PT General Charges $$ ACUTE PT VISIT: 1 Visit                     Rozanna Corner, PT, DPT Physical Therapist - [facility]  11/02/23, 5:12 PM

## 2023-11-02 NOTE — Progress Notes (Signed)
 Occupational Therapy Treatment Patient Details Name: Nicholas Gross MRN: [id number] DOB: 11-Jan-1928 Today's Date: 11/02/2023   History of present illness Nicholas Ardito. Kachmar is 89 y.o. male who lost his balance and fell trying to sit down in his wheelchair.  Pt is s/p L hemiarthroplasty on 10/29/23.  PMH: CAD, HLD, PAD. Workup revealing of subactue Lt 7th rib fracture. Head/cervical CT negative.   OT comments  Family present for duration of OT session (niece and her spouse).  Pt in bed but agreeable to transfer up to chair.  2 standing trials completed, with pt requiring extensive cues for more erect standing posture and attempts to even distribution of WB through BLEs, as pt was actually heavily bearing weight into his post surgical L side, and struggled to straighten RLE to help with weight distribution and balance (hx of R sided weakness from CVA).  Pt was able to stand with mod A of 1, but LPN assisted with step pivot to chair d/t this uneven weight distribution through BLEs offsetting balance.  Once up in chair, OT reviewed posterior hip precautions with pt; family present.  Currently requiring max-total A for LB ADLs d/t hip precautions on the L and weakness on the R from old CVA.  Will continue to reinforce precautions with ADLs.  OT encouraged pt to sit up for at least 1 hour and informed LPN of this goal.  See below for additional ADL details.  Will continue to follow to work towards goals in OT poc.        If plan is discharge home, recommend the following:  A lot of help with walking and/or transfers;Supervision due to cognitive status;Help with stairs or ramp for entrance;Two people to help with bathing/dressing/bathroom;Assist for transportation;Assistance with cooking/housework   Equipment Recommendations  Other (comment) (defer to next venue of care)    Recommendations for Other Services      Precautions / Restrictions Precautions Precautions: Fall;Posterior Hip Recall of  Precautions/Restrictions: Impaired Restrictions Weight Bearing Restrictions Per Provider Order: Yes RLE Weight Bearing Per Provider Order: Weight bearing as tolerated LLE Weight Bearing Per Provider Order: Weight bearing as tolerated       Mobility Bed Mobility Overal bed mobility: Needs Assistance Bed Mobility: Supine to Sit     Supine to sit: Mod assist, HOB elevated, Used rails       Patient Response: Cooperative  Transfers Overall transfer level: Needs assistance Equipment used: Rolling walker (2 wheels) Transfers: Sit to/from Stand Sit to Stand: Mod assist, From elevated surface           General transfer comment: slightly elevated bed due to pt's height; performed STS x2 trials, with second trial completing step pivot to chair     Balance Overall balance assessment: Needs assistance Sitting-balance support: No upper extremity supported, Feet supported Sitting balance-[name]: Fair Sitting balance - Comments: slight R lateral lean (hx of R sided weakness from old CVA); pillow placed beneath RUE and arm rest once up in chair to promote midline sitting   Standing balance support: Bilateral upper extremity supported Standing balance-[name]: Poor                             ADL either performed or assessed with clinical judgement   ADL Overall ADL's : Needs assistance/impaired                     Lower Body Dressing: Total assistance;Sitting/lateral leans  Lower Body Dressing Details (indicate cue type and reason): OT encouraged pt lift R LE as if to attempt to cross leg, but lift not sufficient enough to don R sock.  OT reminded pt of hip precautions for the LLE; OT assisted to don both socks   Toilet Transfer Details (indicate cue type and reason): Niece verbalized pt had just had an episode of BI prior to OT arrival and had been cleaned up, so toilet transfer attempt deferred Toileting- Clothing Manipulation and Hygiene: Total  assistance       Functional mobility during ADLs: Moderate assistance;+2 for physical assistance      Extremity/Trunk Assessment Upper Extremity Assessment Upper Extremity Assessment: Generalized weakness;RUE deficits/detail RUE Deficits / Details: hx of CVA   Lower Extremity Assessment Lower Extremity Assessment: Generalized weakness;RLE deficits/detail;LLE deficits/detail RLE Deficits / Details: hx of CVA LLE Deficits / Details: pain/weakness post L hip hemiarthroplasty.   Cervical / Trunk Assessment Cervical / Trunk Assessment: Kyphotic    Vision Baseline Vision/History: 1 Wears glasses Patient Visual Report: No change from baseline Additional Comments: Niece reports low vision at baseline             Communication Communication Communication: Impaired Factors Affecting Communication: Difficulty expressing self   Cognition Arousal: Alert Behavior During Therapy: Flat affect Cognition: Difficult to assess Difficult to assess due to: Impaired communication           OT - Cognition Comments: hx of expressive aphasia                 Following commands: Intact        Cueing   Cueing Techniques: Verbal cues, Gestural cues, Tactile cues  Exercises Other Exercises Other Exercises: OT reviewed posterior hip precautions with pt; niece and her spouse present for educ; further reinforcement needed                 Pertinent Vitals/ Pain       Pain Assessment Pain Score: 5  Pain Location: L LE when mobilizing Pain Descriptors / Indicators: Other (Comment) (pt unable to describe) Pain Intervention(s): Monitored during session, RN gave pain meds during session, Repositioned, Limited activity within patient's tolerance  Home Living                                                        Frequency  Min 2X/week        Progress Toward Goals  OT Goals(current goals can now be found in the care plan section)  Progress towards  OT goals: Progressing toward goals  Acute Rehab OT Goals Patient Stated Goal: to walk OT Goal Formulation: With family Time For Goal Achievement: 11/13/23 Potential to Achieve Goals: Fair  Plan      Co-evaluation                 AM-PAC OT "6 Clicks" Daily Activity     Outcome Measure   Help from another person eating meals?: A Little Help from another person taking care of personal grooming?: A Little Help from another person toileting, which includes using toliet, bedpan, or urinal?: Total Help from another person bathing (including washing, rinsing, drying)?: A Lot Help from another person to put on and taking off regular upper body clothing?: A Little Help from another person to put on and taking  off regular lower body clothing?: Total 6 Click Score: 13    End of Session Equipment Utilized During Treatment: Rolling walker (2 wheels);Gait belt  OT Visit Diagnosis: Other abnormalities of gait and mobility (R26.89);Muscle weakness (generalized) (M62.81)   Activity Tolerance Patient tolerated treatment well   Patient Left in chair;with call bell/phone within reach;with chair alarm set;with family/visitor present   Nurse Communication Mobility status        Time: 4098-1191 OT Time Calculation (min): 40 min  Charges: OT General Charges $OT Visit: 1 Visit OT Treatments $Self Care/Home Management : 38-52 mins  Marcus Sewer, MS, OTR/L   Casandra Claw 11/02/2023, 4:29 PM

## 2023-11-02 NOTE — Consult Note (Signed)
 Consultation Note Date: 11/02/2023 at 1615  Patient Name: Nicholas Gross  DOB: Jun 05, 1928  MRN: [id number]  Age / Sex: 89 y.o., male  PCP: Solomon Dupre, DO Referring Physician: Brenna Cam, MD  HPI/Patient Profile: 89 y.o. male  with past medical history significant for HTN, CAD, PAD, bullous pemphigoid on chronic prednisone , CVA with residual R sided weakness, frequent falls with chronic imbalance. He was recently admitted 4/7-4/14/25 pelvic fracture from fall, managed non-surgically, rhabdomyolysis with stay complicated by acute urinary retention and AMS. He was d/c to [facility]. He presented from [facility] via EMS with family 10/28/23 c/o L hip pain after an accidental fall as he was trying to sit in wheelchair.   ED workup showed BP 161/66, Na+ 134, K+3.0, BUN 35, Creatinine 0.88, Ca+8.4, Alb 3.4, troponin 13, WBC 12.8, Hgb 10.0 and Hct 29.0. UA + for leukocytes and nitrite.  Hip XR demonstrated acute, nondisplaced left femoral neck fracture as well as known R superior and inferior pubic rami fractures.   He was admitted to [facility] for further management of closed displaced fracture of left femoral neck and UTI.   Surgery consulted for management of L femoral neck fracture. Underwent L hip bipolar hemiarthroplasty with Dr. Daun Epstein 10/29/23.   Palliative consulted for goals of care.   Clinical Assessment and Goals of Care: Extensive chart review completed prior to meeting patient including labs, vital signs, imaging, progress notes, orders, and available advanced directive documents from current and previous encounters. I then met with patient to discuss diagnosis prognosis, GOC, EOL wishes, disposition and options.  Elderly, ill-appearing male sitting upright in recliner. He has difficulty speaking, but is able to answer simple yes/no questions by shaking his head. He is alert, unable to assess  orientation. He is in no distress.   Patient does not respond to initial question of pain. When asked what he wants, he pats the bed and confirms he wants to get into bed.  When questioned again about pain, he says "knee" and touches L knee. He shakes his head yes when asked if his niece is [name].   Notified staff that patient wants to be moved to bed from recliner.  Per TOC note, plan is for patient to return to [facility] under facility hospice care. Confirmed with Aldona Amel, CM, this is the plan for patient. She confirms she is working on this case.   Attempted to contact Hillery Lown, niece, via phone to confirm plan for patient without success.      Primary Decision Maker NEXT OF KIN  Physical Exam Vitals reviewed.  Constitutional:      General: He is not in acute distress.    Appearance: He is ill-appearing.  HENT:     Head: Normocephalic and atraumatic.     Mouth/Throat:     Mouth: Mucous membranes are moist.  Pulmonary:     Effort: Pulmonary effort is normal. No respiratory distress.  Musculoskeletal:     Right lower leg: No edema.     Left  lower leg: No edema.  Skin:    General: Skin is warm and dry.  Neurological:     Mental Status: He is alert. He is disoriented.    Discussed plan of care with Aldona Amel, CM and Dr. Mason Sole   Recommendations/Plan: Continue DNR/DNI status as previously documented    Continue current supportive interventions TOC coordinating transfer to [facility] with facility hospice   Thank you for this consult. Palliative medicine will continue to follow peripherally and assist holistically as needed.   Time Total: 45 minutes  Time spent includes: Detailed review of medical records (labs, imaging, vital signs), medically appropriate exam (mental status, respiratory, cardiac, skin), discussed with treatment team, counseling and educating patient, family and staff, documenting clinical information, medication management and  coordination of care.     Ina Manas, Joyice Nodal[facility] Palliative Medicine Team  11/02/2023 4:22 PM  Office [phone]  Pager [phone]     Please contact Palliative Medicine Team providers via AMION for questions and concerns.

## 2023-11-02 NOTE — Discharge Instructions (Signed)

## 2023-11-02 NOTE — Progress Notes (Signed)
 Speech Language Pathology Treatment: Dysphagia  Patient Details Name: Nicholas Gross MRN: [id number] DOB: 03-15-1928 Today's Date: 11/02/2023 Time: 0940-1000 SLP Time Calculation (min) (ACUTE ONLY): 20 min  Assessment / Plan / Recommendation Clinical Impression  Pt seen for follow up dysphagia intervention. Niece, [name], present for duration of session. Pt afebrile, on room air, WBC WNL, no recent chest imaging, and with increased alertness and engagement noted. Trials completed of puree, regular solids, and thin liquids via cup. Pt able to hold cup and self feed finger foods with increased support required for use of utensil. No overt or subtle s/sx pharyngeal dysphagia noted. No change to vocal quality across trials. Oral phase mildly prolonged for regular solids, with liquid wash and extended time utilized for oral clearance. Belching noted x1 (in accordance with known esophageal factors).   Education shared with pt/niece regarding rationale for current diet recommendations and difference between safest vs pleasure diet (given pursuit of hospice mindset at discharge). All reported understanding.   Based on current level of engagement/attention and oral manipulation of solids noted during session, recommend advancement to Dys 3 with thin liquids. Aspiration risk remains based on deconditioning, age, and hx of CVA/complaint of globus sensation. Risk is mediated with use of aspiration precautions (slow rate, small bites, elevated HOB during and after intake, alert for intake, and monitor straw use. Recommend supervision/assistance with meals for compliance with recommendations and assist with feeding. RN and MD aware of recommendations. No further acute SLP services indicated at this time.    HPI HPI: Per MD progress note, "Nicholas Gross with medical history significant for HTN, CAD, PAD, bullous pemphigoid on chronic prednisone , CVA with residual right-sided weakness, frequent falls, chronic  imbalance  recently admitted from 4/7 to 10/05/2023 with a fall with pelvic fracture and rhabdomyolysis managed nonsurgically, with stay complicated by acute urinary retention and altered mental status, discharged to SNF, who is being admitted with a left femoral neck fracture which he sustained from a likely accidental fall as he was trying to sit in his wheelchair. S/p left hip bipolar hemiarthroplasty." DG Chest 10/28/23 "No active disease." Head CT 10/29/23 "no acute intracranial or cervical spine finding."      SLP Plan  All goals met      Recommendations for follow up therapy are one component of a multi-disciplinary discharge planning process, led by the attending physician.  Recommendations may be updated based on patient status, additional functional criteria and insurance authorization.    Recommendations  Diet recommendations: Dysphagia 3 (mechanical soft);Thin liquid Liquids provided via: No straw;Cup Medication Administration: Whole meds with liquid (vs puree) Supervision: Staff to assist with self feeding;Full supervision/cueing for compensatory strategies Compensations: Minimize environmental distractions;Slow rate;Small sips/bites Postural Changes and/or Swallow Maneuvers: Seated upright 90 degrees;Upright 30-60 min after meal                  Oral care before and after PO;Staff/trained caregiver to provide oral care   Set up Supervision/Assistance (with PO intake) Dysphagia, oropharyngeal phase (R13.12)     All goals met    Nicholas Marsha Gundlach Clapp, MS, CCC-SLP Speech Language Pathologist Rehab Services; [facility] [phone] (ascom)   Nicholas Gross  11/02/2023, 10:57 AM

## 2023-11-02 NOTE — Plan of Care (Signed)

## 2023-11-03 ENCOUNTER — Ambulatory Visit: Admitting: Family Medicine

## 2023-11-03 DIAGNOSIS — S72002A Fracture of unspecified part of neck of left femur, initial encounter for closed fracture: Secondary | ICD-10-CM | POA: Diagnosis not present

## 2023-11-03 DIAGNOSIS — N3000 Acute cystitis without hematuria: Secondary | ICD-10-CM | POA: Diagnosis not present

## 2023-11-03 DIAGNOSIS — E440 Moderate protein-calorie malnutrition: Secondary | ICD-10-CM | POA: Diagnosis not present

## 2023-11-03 DIAGNOSIS — Z7189 Other specified counseling: Secondary | ICD-10-CM | POA: Diagnosis not present

## 2023-11-03 LAB — CBC
HCT: 24 % — ABNORMAL LOW (ref 39.0–52.0)
Hemoglobin: 8 g/dL — ABNORMAL LOW (ref 13.0–17.0)
MCH: 33.1 pg (ref 26.0–34.0)
MCHC: 33.3 g/dL (ref 30.0–36.0)
MCV: 99.2 fL (ref 80.0–100.0)
Platelets: 147 10*3/uL — ABNORMAL LOW (ref 150–400)
RBC: 2.42 MIL/uL — ABNORMAL LOW (ref 4.22–5.81)
RDW: 12.9 % (ref 11.5–15.5)
WBC: 7.4 10*3/uL (ref 4.0–10.5)
nRBC: 0 % (ref 0.0–0.2)

## 2023-11-03 LAB — BASIC METABOLIC PANEL WITH GFR
Anion gap: 4 — ABNORMAL LOW (ref 5–15)
BUN: 31 mg/dL — ABNORMAL HIGH (ref 8–23)
CO2: 22 mmol/L (ref 22–32)
Calcium: 8.2 mg/dL — ABNORMAL LOW (ref 8.9–10.3)
Chloride: 107 mmol/L (ref 98–111)
Creatinine, Ser: 0.76 mg/dL (ref 0.61–1.24)
GFR, Estimated: >60 mL/min (ref >60)
Glucose, Bld: 102 mg/dL — ABNORMAL HIGH (ref 70–99)
Potassium: 4.5 mmol/L (ref 3.5–5.1)
Sodium: 133 mmol/L — ABNORMAL LOW (ref 135–145)

## 2023-11-03 NOTE — Progress Notes (Signed)
 1      PROGRESS NOTE    Nicholas Gross  WUJ:[id number] DOB: [birth year] DOA: 10/28/2023 PCP: Solomon Dupre, DO    Brief Narrative:   89 y.o. male with medical history significant for HTN, CAD, PAD, bullous pemphigoid on chronic prednisone , CVA with residual right-sided weakness, frequent falls, chronic imbalance  recently admitted from 4/7 to 10/05/2023 with a fall with pelvic fracture and rhabdomyolysis managed nonsurgically, with stay complicated by acute urinary retention and altered mental status, discharged to SNF, who is being admitted with a left femoral neck fracture which he sustained from a likely accidental fall as he was trying to sit in his wheelchair.   5/8: Orthopedic consult, Left hip bipolar hemiarthroplasty.  5/9: PT and OT eval, HC POA paperwork 5/10: Scheduled bowel regimen added 5/11: Palliative care consult.  5/12: Diet advanced to dysphagia 3.  Work with PT and OT.  Waiting for placement 5/13: Family changed mind and would like to send him to SNF for now and hold off hospice   Assessment & Plan:   Principal Problem:   Closed displaced fracture of left femoral neck (HCC) Active Problems:   Fall   Urinary retention   Urinary tract infection   Essential hypertension   CAD (coronary artery disease)   Goals of care, counseling/discussion   PAD (peripheral artery disease) (HCC)   Dysarthria as late effect of cerebrovascular accident (CVA)   Malnutrition of moderate degree   * Closed displaced fracture of left femoral neck (HCC) Status post left hip bipolar hemiarthroplasty on 5/8 DVT prophylaxis and pain management per Ortho.  Last BM on 5/12   Acute urinary retention BPH Foley catheter being placed in the ED and removed on 5/9. Continue tamsulosin .  Purewick in place  Acute on chronic anemia Likely IntraOp/postop blood loss Hemoglobin 12.2 on on 09/29/2023-> 8.0 today Will transfuse 1 PRBC for hemoglobin less than 7.  No obvious GI bleed  Proteus  mirabilis urinary tract infection Urinalysis consistent with UTI with positive leuks and nitrites Completed course of Rocephin  urine culture growing Proteus mirabilis more than 100,000 colonies   PAD (peripheral artery disease) (HCC) History of carotid endarterectomy Continue aspirin  and rosuvastatin    CAD (coronary artery disease) No complaints of chest pain and EKG nonacute Continue lisinopril , rosuvastatin  and aspirin    Hypertension Continue lisinopril    Dysarthria/aphasia secondary to old CVA Increase nursing assistance for communication of needs   Bullous pemphigoid  On chronic prednisone  and doxycycline   Goals of care Family would like to hold off hospice and pursue SNF for now  DVT prophylaxis:  enoxaparin  (LOVENOX ) injection 40 mg Start: 10/30/23 0800 SCDs Start: 10/29/23 1810 Place TED hose Start: 10/29/23 1810     Code Status: DNR Family Communication: Sister updated at bedside.   Disposition Plan: Patient is medically stable, waiting for SNF placement   Consultants:  Orthopedics  Procedures:   left hip bipolar hemiarthroplasty on 5/8  Antimicrobials:  Oral doxycycline    Subjective:  Awake and alert.  Feeling much better.  Sister at bedside  Objective: Vitals:   11/02/23 1534 11/02/23 2019 11/03/23 0401 11/03/23 0802  BP: 124/60 118/63 (!) 143/64 (!) 141/57  Pulse: 67 60 (!) 59 (!) 59  Resp: 18 17 17 16   Temp: 98.3 F (36.8 C) 98.8 F (37.1 C) 98.2 F (36.8 C) 98.6 F (37 C)  TempSrc: Oral Oral Oral Oral  SpO2: 100% 99% 98% 98%  Weight:      Height:  Intake/Output Summary (Last 24 hours) at 11/03/2023 1344 Last data filed at 11/03/2023 0803 Gross per 24 hour  Intake 220 ml  Output 1200 ml  Net -980 ml   Filed Weights   10/28/23 2100 10/29/23 1438  Weight: 68 kg 68 kg    Examination:  General exam: Appears calm and comfortable  Respiratory system: Clear to auscultation. Respiratory effort normal. Cardiovascular system: S1  & S2 heard, RRR. No pedal edema. Gastrointestinal system: Abdomen is soft, benign Central nervous system: Alert and and awake, residual right-sided weakness from previous stroke Extremities: Left hip honeycomb dressing in place.  No drainage noted.  It is clean and dry    Data Reviewed: I have personally reviewed following labs and imaging studies  CBC: Recent Labs  Lab 10/28/23 2347 10/30/23 0528 10/31/23 0451 11/01/23 0503 11/02/23 0455 11/03/23 0454  WBC 12.8* 9.6 10.2 8.8 6.8 7.4  NEUTROABS 10.6*  --   --   --   --   --   HGB 10.0* 8.1* 7.3* 7.5* 7.5* 8.0*  HCT 29.0* 24.6* 21.8* 22.1* 22.6* 24.0*  MCV 95.7 97.2 97.3 96.1 98.3 99.2  PLT 180 132* 123* 137* 136* 147*   Basic Metabolic Panel: Recent Labs  Lab 10/30/23 0528 10/31/23 0451 11/01/23 0503 11/02/23 0455 11/03/23 0454  NA 136 134* 135 135 133*  K 3.7 3.9 4.1 4.3 4.5  CL 101 103 103 109 107  CO2 25 25 24 22 22   GLUCOSE 143* 108* 108* 101* 102*  BUN 23 33* 33* 31* 31*  CREATININE 0.82 0.88 0.83 0.77 0.76  CALCIUM  7.9* 7.8* 8.0* 7.9* 8.2*   GFR: Estimated Creatinine Clearance: 54.3 mL/min (by C-G formula based on SCr of 0.76 mg/dL). Liver Function Tests: Recent Labs  Lab 10/28/23 2347  AST 20  ALT 26  ALKPHOS 109  BILITOT 1.1  PROT 5.5*  ALBUMIN 3.4*   No results for input(s): "LIPASE", "AMYLASE" in the last 168 hours. No results for input(s): "AMMONIA" in the last 168 hours. Coagulation Profile: Recent Labs  Lab 10/28/23 2347  INR 1.2     Recent Results (from the past 240 hours)  Urine Culture     Status: Abnormal   Collection Time: 10/29/23  2:19 AM   Specimen: Urine, Random  Result Value Ref Range Status   Specimen Description   Final    URINE, RANDOM Performed at [facility], [address] [place], [place] [address]    Special Requests   Final    NONE Reflexed from [id number] Performed at [facility], [address] [place], [place] [address]    Culture  >=100,000 COLONIES/mL PROTEUS MIRABILIS (A)  Final   Report Status 10/31/2023 FINAL  Final   Organism ID, Bacteria PROTEUS MIRABILIS (A)  Final      Susceptibility   Proteus mirabilis - MIC*    AMPICILLIN >=32 RESISTANT Resistant     CEFAZOLIN  8 SENSITIVE Sensitive     CEFEPIME <=0.12 SENSITIVE Sensitive     CEFTRIAXONE  <=0.25 SENSITIVE Sensitive     CIPROFLOXACIN  <=0.25 SENSITIVE Sensitive     GENTAMICIN <=1 SENSITIVE Sensitive     IMIPENEM 2 SENSITIVE Sensitive     NITROFURANTOIN 128 RESISTANT Resistant     TRIMETH /SULFA  >=320 RESISTANT Resistant     AMPICILLIN/SULBACTAM 16 INTERMEDIATE Intermediate     PIP/TAZO <=4 SENSITIVE Sensitive ug/mL    * >=100,000 COLONIES/mL PROTEUS MIRABILIS     Scheduled Meds:  doxycycline   100 mg Oral BID   enoxaparin  (  LOVENOX ) injection  40 mg Subcutaneous Q24H   feeding supplement  237 mL Oral BID BM   lisinopril   5 mg Oral Daily   polyethylene glycol  17 g Oral BID   potassium chloride   20 mEq Oral Daily   predniSONE   20 mg Oral Q breakfast   rosuvastatin   10 mg Oral Daily   senna-docusate  2 tablet Oral BID   tamsulosin   0.4 mg Oral QPC supper   Continuous Infusions:     LOS: 5 days    Time spent: 35 minutes    Brenna Cam, MD [facility] Pager [phone]  If 7PM-7AM, please contact night-coverage [email or web address]  11/03/2023, 1:44 PM

## 2023-11-03 NOTE — Progress Notes (Signed)
 Occupational Therapy Treatment Patient Details Name: Nicholas Gross MRN: [id number] DOB: 1927-07-15 Today's Date: 11/03/2023   History of present illness Nicholas Gross is 89 y.o. male who lost his balance and fell trying to sit down in his wheelchair.  Pt is s/p L hemiarthroplasty on 10/29/23.  PMH: CAD, HLD, PAD. Workup revealing of subactue Lt 7th rib fracture. Head/cervical CT negative.   OT comments  Mr Goodnow seen for OT treatment on this date. Upon arrival to room pt supine in bed, agreeable to tx. Pt requires MIN A for bed mobility, MIN A + RW for t/f from bed>chair. Pt making good progress toward goals, will continue to follow POC. Discharge recommendation remains appropriate.        If plan is discharge home, recommend the following:  A lot of help with walking and/or transfers;Supervision due to cognitive status;Help with stairs or ramp for entrance;Two people to help with bathing/dressing/bathroom;Assist for transportation;Assistance with cooking/housework   Equipment Recommendations  Other (comment)    Recommendations for Other Services      Precautions / Restrictions Precautions Precautions: Fall;Posterior Hip Recall of Precautions/Restrictions: Impaired Restrictions RLE Weight Bearing Per Provider Order: Weight bearing as tolerated LLE Weight Bearing Per Provider Order: Weight bearing as tolerated       Mobility Bed Mobility Overal bed mobility: Needs Assistance Bed Mobility: Supine to Sit     Supine to sit: Min assist     General bed mobility comments: MIN A for moving to EOB    Transfers Overall transfer level: Needs assistance Equipment used: Rolling walker (2 wheels) Transfers: Sit to/from Stand, Bed to chair/wheelchair/BSC Sit to Stand: Min assist Stand pivot transfers: Min assist               Balance Overall balance assessment: Needs assistance Sitting-balance support: No upper extremity supported, Feet supported Sitting  balance-[name]: Fair     Standing balance support: Bilateral upper extremity supported Standing balance-[name]: Poor                             ADL either performed or assessed with clinical judgement   ADL Overall ADL's : Needs assistance/impaired                                       General ADL Comments: MIN A + RW for simulated BSC t/f    Extremity/Trunk Assessment Upper Extremity Assessment Upper Extremity Assessment: Generalized weakness RUE Deficits / Details: hx of CVA   Lower Extremity Assessment Lower Extremity Assessment: Generalized weakness RLE Deficits / Details: hx of CVA LLE Deficits / Details: pain/weakness post L hip hemiarthroplasty.        Vision       Perception     Praxis     Communication Communication Communication: Impaired Factors Affecting Communication: Difficulty expressing self   Cognition Arousal: Alert Behavior During Therapy: Flat affect Cognition: Difficult to assess Difficult to assess due to: Impaired communication           OT - Cognition Comments: hx of expressive aphasia                 Following commands: Intact        Cueing   Cueing Techniques: Verbal cues, Gestural cues, Tactile cues  Exercises      Shoulder Instructions  General Comments Back was itching    Pertinent Vitals/ Pain       Pain Assessment Pain Assessment: PAINAD Breathing: normal Negative Vocalization: none Facial Expression: smiling or inexpressive Body Language: tense, distressed pacing, fidgeting Consolability: no need to console PAINAD Score: 1 Pain Location: flank Pain Descriptors / Indicators: Discomfort Pain Intervention(s): Repositioned  Home Living                                          Prior Functioning/Environment              Frequency  Min 2X/week        Progress Toward Goals  OT Goals(current goals can now be found in the care plan  section)  Progress towards OT goals: Progressing toward goals  Acute Rehab OT Goals Patient Stated Goal: to walk OT Goal Formulation: With family Time For Goal Achievement: 11/17/23 Potential to Achieve Goals: Fair ADL Goals Pt Will Perform Grooming: sitting;with min assist Pt Will Perform Lower Body Dressing: with min assist;sit to/from stand Pt Will Transfer to Toilet: with contact guard assist;stand pivot transfer;bedside commode  Plan      Co-evaluation                 AM-PAC OT "6 Clicks" Daily Activity     Outcome Measure   Help from another person eating meals?: A Little Help from another person taking care of personal grooming?: A Little Help from another person toileting, which includes using toliet, bedpan, or urinal?: A Lot Help from another person bathing (including washing, rinsing, drying)?: A Lot Help from another person to put on and taking off regular upper body clothing?: A Little Help from another person to put on and taking off regular lower body clothing?: A Lot 6 Click Score: 15    End of Session Equipment Utilized During Treatment: Rolling walker (2 wheels)  OT Visit Diagnosis: Other abnormalities of gait and mobility (R26.89);Muscle weakness (generalized) (M62.81)   Activity Tolerance Patient tolerated treatment well   Patient Left in chair;with call bell/phone within reach;with chair alarm set;with family/visitor present   Nurse Communication          Time: 4098-1191 OT Time Calculation (min): 13 min  Charges: OT General Charges $OT Visit: 1 Visit OT Treatments $Self Care/Home Management : 8-22 mins  Stevenson Elbe, Student OT   [employer] 11/03/2023, 4:00 PM

## 2023-11-03 NOTE — Progress Notes (Signed)
 Subjective: 5 Days Post-Op Procedure(s) (LRB): HEMIARTHROPLASTY (BIPOLAR) HIP, POSTERIOR APPROACH FOR FRACTURE (Left) Patient is resting well this AM, no signs of pain. Patient is well, and has had no acute complaints or problems PT and care management to assist with discharge planning.  Current plan is for SNF with hospice care.  Bed search has been initiated. Negative for chest pain and shortness of breath Fever: no Gastrointestinal:Negative for nausea and vomiting  Objective: Vital signs in last 24 hours: Temp:  [97.8 F (36.6 C)-98.8 F (37.1 C)] 98.6 F (37 C) (05/13 0802) Pulse Rate:  [59-69] 59 (05/13 0802) Resp:  [16-18] 16 (05/13 0802) BP: (118-143)/(57-64) 141/57 (05/13 0802) SpO2:  [98 %-100 %] 98 % (05/13 0802)  Intake/Output from previous day:  Intake/Output Summary (Last 24 hours) at 11/03/2023 1237 Last data filed at 11/03/2023 0803 Gross per 24 hour  Intake 220 ml  Output 1750 ml  Net -1530 ml    Intake/Output this shift: Total I/O In: -  Out: 300 [Urine:300]  Labs: Recent Labs    11/01/23 0503 11/02/23 0455 11/03/23 0454  HGB 7.5* 7.5* 8.0*   Recent Labs    11/02/23 0455 11/03/23 0454  WBC 6.8 7.4  RBC 2.30* 2.42*  HCT 22.6* 24.0*  PLT 136* 147*   Recent Labs    11/02/23 0455 11/03/23 0454  NA 135 133*  K 4.3 4.5  CL 109 107  CO2 22 22  BUN 31* 31*  CREATININE 0.77 0.76  GLUCOSE 101* 102*  CALCIUM  7.9* 8.2*   No results for input(s): "LABPT", "INR" in the last 72 hours.    EXAM General - Patient is Alert. Extremity - ABD soft Neurovascular intact Dorsiflexion/Plantar flexion intact Incision: dressing C/D/I No cellulitis present Dressing/Incision - clean, dry, no drainage noted to the left hip honeycomb dressing. Motor Function - intact, moving foot and toes well on exam.  Abdomen soft with intact bowel sounds this AM.  Past Medical History:  Diagnosis Date   CAD (coronary artery disease)    History of GI bleed 2008    History of tobacco use    17 pack years, quit around 1970   Hyperlipidemia    Hypertension    Overweight     Assessment/Plan: 5 Days Post-Op Procedure(s) (LRB): HEMIARTHROPLASTY (BIPOLAR) HIP, POSTERIOR APPROACH FOR FRACTURE (Left) Principal Problem:   Closed displaced fracture of left femoral neck (HCC) Active Problems:   CAD (coronary artery disease)   Goals of care, counseling/discussion   PAD (peripheral artery disease) (HCC)   Fall   Urinary retention   Essential hypertension   Dysarthria as late effect of cerebrovascular accident (CVA)   Urinary tract infection   Malnutrition of moderate degree  Estimated body mass index is 22.79 kg/m as calculated from the following:   Height as of this encounter: 5\' 8"  (1.727 m).   Weight as of this encounter: 68 kg. Advance diet Up with therapy D/C IV fluids when tolerating po intake.  Labs and vitals reviewed. Hg 8.0, continue to monitor.  Transfuse if drops below 7. WBC 7.4 this morning. Up with PT.  Current plan is for d/c to SNF with hospice care. Care management to assist with d/c planning, was at [facility] prior to fall. Patient has had a BM.  Staples can be removed by SNF on 11/12/23. Continue Lovenox  40mg  daily for 14 days for DVT prophylaxis. Follow-up with [facility] in 6 weeks for x-rays of the left hip.  DVT Prophylaxis - Lovenox  and TED  hose Weight-Bearing as tolerated to left leg  J. Edie Goon, PA-C [facility] Orthopaedic Surgery 11/03/2023, 12:37 PM

## 2023-11-03 NOTE — Progress Notes (Signed)
 Nutrition Follow-up  DOCUMENTATION CODES:   Non-severe (moderate) malnutrition in context of chronic illness  INTERVENTION:   -Continue Ensure Enlive po BID, each supplement provides 350 kcal and 20 grams of protein.  -Continue MVI with minerals daily -Continue dysphagia 3 diet   NUTRITION DIAGNOSIS:   Moderate Malnutrition related to chronic illness (CVA) as evidenced by mild fat depletion, mild muscle depletion.  Ongoing  GOAL:   Patient will meet greater than or equal to 90% of their needs  Progressing   MONITOR:   PO intake, Supplement acceptance, Diet advancement  REASON FOR ASSESSMENT:   Consult Assessment of nutrition requirement/status, Hip fracture protocol  ASSESSMENT:   Pt with medical history significant for HTN, CAD, PAD, bullous pemphigoid on chronic prednisone , CVA with residual right-sided weakness, frequent falls, chronic imbalance  recently admitted from 4/7 to 10/05/2023 with a fall with pelvic fracture and rhabdomyolysis managed nonsurgically, with stay complicated by acute urinary retention and altered mental status, discharged to SNF, who is being admitted with a left femoral neck fracture which he sustained from a likely accidental fall as he was trying to sit in his wheelchair  5/10- s/p BSE- dysphagia 1 diet with thin liquids 5/12- s/p BSE- advanced to dysphagia 3 diet with thin liquids  Reviewed I/O's: -1.2 L x 24 hours and -1.4 L since admission  UOP: 1.5 L x 24 hours   Pt has been upgraded to a dysphagia 3 diet with thin liquids. Noted meal completions 30-100%. Pt also drinking Ensure supplements.   No new wt since last visit.   Palliative care following for goals of care discussions. Pt family would like to hold off on hospice for now.   Per TOC notes, plan to discharge to SNF once medically stable.   Medications reviewed and include miralax , potassium chloride , prednisone , senokot.   Labs reviewed: Na: 133, CBGS: 108 (inpatient orders  for glycemic control are ).    Diet Order:   Diet Order             DIET DYS 3 Room service appropriate? Yes; Fluid consistency: Thin  Diet effective now                   EDUCATION NEEDS:   Education needs have been addressed  Skin:  Skin Assessment: Skin Integrity Issues: Skin Integrity Issues:: Incisions Incisions: closed lt hip  Last BM:  11/03/23 (type 7)  Height:   Ht Readings from Last 1 Encounters:  10/29/23 5\' 8"  (1.727 m)    Weight:   Wt Readings from Last 1 Encounters:  10/29/23 68 kg    Ideal Body Weight:  70 kg  BMI:  Body mass index is 22.79 kg/m.  Estimated Nutritional Needs:   Kcal:  1700-1900  Protein:  90-105 grams  Fluid:  1.7-1.9 L    Herschel Lords, RD, LDN, CDCES Registered Dietitian III Certified Diabetes Care and Education Specialist If unable to reach this RD, please use "RD Inpatient" group chat on secure chat between hours of 8am-4 pm daily

## 2023-11-03 NOTE — NC FL2 (Signed)
 [place]  MEDICAID [id number] LEVEL OF CARE FORM     IDENTIFICATION  Patient Name: Nicholas Gross Birthdate: August 06, 1927 Sex: male Admission Date (Current Location): 10/28/2023  [place] and [place] Number:  Chiropodist and Address:  [facility], [address] [place], [place] [address]      Provider Number:    Attending Physician Name and Address:  Brenna Cam, MD  Relative Name and Phone Number:  Sister: Rachael Budd    Current Level of Care: Hospital Recommended Level of Care: Skilled Nursing Facility Prior Approval Number:    Date Approved/Denied:   PASRR Number: [id number]  Discharge Plan: SNF    Current Diagnoses: Patient Active Problem List   Diagnosis Date Noted   Malnutrition of moderate degree 10/30/2023   Closed displaced fracture of left femoral neck (HCC) 10/29/2023   Dysarthria as late effect of cerebrovascular accident (CVA) 10/29/2023   Urinary tract infection 10/29/2023   Fracture of pubic ramus with delayed healing, unspecified laterality 10/05/2023   Bullous pemphigoid 10/05/2023   Functional quadriplegia (HCC) 10/05/2023   Hypernatremia 09/30/2023   Pressure injury of skin 09/30/2023   Fall 09/29/2023   Urinary retention 09/29/2023   Essential hypertension 09/29/2023   Hypokalemia 09/29/2023   Hematoma of extraperitoneal space 09/29/2023   Acute encephalopathy 09/28/2023   Weight loss 06/05/2023   Bullous disorder 06/01/2023   Mesenteric mass 06/01/2023   Dysarthria 04/17/2023   Tremor 04/17/2023   Aphasia 04/10/2023   Dysphagia 04/10/2023   History of falling 03/24/2023   Long term (current) use of aspirin  03/24/2023   Occlusion and stenosis of unspecified carotid artery 03/24/2023   Pain and swelling of lower leg 09/15/2022   Right acetabular fracture (HCC) 06/17/2022   Aortic stenosis, mild 04/25/2022   Cellulitis of both lower extremities 04/24/2022   Aneurysm of ascending aorta without  rupture (HCC) 12/23/2021   Compression fracture of L1 lumbar vertebra (HCC) 12/23/2021   Pulmonary nodule 12/23/2021   Right inguinal hernia 12/23/2021   Osteoarthritis of right hip 12/23/2021   Aortic atherosclerosis (HCC) 12/23/2021   Multiple falls 12/12/2021   Left rib fracture 12/12/2021   Senile purpura (HCC) 11/08/2021   PAD (peripheral artery disease) (HCC) 07/15/2020   Lymphedema 05/06/2019   Dyslipidemia 02/13/2016   Macular degeneration 08/15/2015   Carotid atherosclerosis 08/15/2015   Goals of care, counseling/discussion 02/12/2015   Allergic rhinitis due to pollen 02/12/2015   Hypertension    CAD (coronary artery disease)    H/O cardiac catheterization 06/12/2014   History of left-sided carotid endarterectomy 06/12/2014    Orientation RESPIRATION BLADDER Height & Weight     Self  Normal Incontinent Weight: 68 kg Height:  5\' 8"  (172.7 cm)  BEHAVIORAL SYMPTOMS/MOOD NEUROLOGICAL BOWEL NUTRITION STATUS      Incontinent Diet (Dysphagia 3, thin liquids)  AMBULATORY STATUS COMMUNICATION OF NEEDS Skin   Limited Assist Verbally Surgical wounds                       Personal Care Assistance Level of Assistance  Bathing, Feeding, Dressing Bathing Assistance: Limited assistance Feeding assistance: Limited assistance Dressing Assistance: Limited assistance     Functional Limitations Info             SPECIAL CARE FACTORS FREQUENCY  PT (By licensed PT), OT (By licensed OT)     PT Frequency: 5 times per week OT Frequency: 5 times per week  Contractures Contractures Info: Not present    Additional Factors Info  Code Status, Allergies Code Status Info: DNR-limited Allergies Info: Simvastatin            Current Medications (11/03/2023):  This is the current hospital active medication list Current Facility-Administered Medications  Medication Dose Route Frequency Provider Last Rate Last Admin   acetaminophen  (TYLENOL ) tablet 325-650 mg   325-650 mg Oral Q6H PRN Poggi, John J, MD   650 mg at 11/02/23 2129   bisacodyl  (DULCOLAX) suppository 10 mg  10 mg Rectal Daily PRN Poggi, John J, MD       diphenhydrAMINE  (BENADRYL ) 12.5 MG/5ML elixir 12.5-25 mg  12.5-25 mg Oral Q4H PRN Poggi, John J, MD   12.5 mg at 11/02/23 2142   doxycycline  (VIBRA -TABS) tablet 100 mg  100 mg Oral BID Poggi, John J, MD   100 mg at 11/02/23 2129   enoxaparin  (LOVENOX ) injection 40 mg  40 mg Subcutaneous Q24H Poggi, John J, MD   40 mg at 11/02/23 0751   feeding supplement (ENSURE ENLIVE / ENSURE PLUS) liquid 237 mL  237 mL Oral BID BM Brenna Cam, MD   237 mL at 11/02/23 1438   HYDROcodone -acetaminophen  (NORCO/VICODIN) 5-325 MG per tablet 1 tablet  1 tablet Oral Q6H PRN Brenna Cam, MD       lisinopril  (ZESTRIL ) tablet 5 mg  5 mg Oral Daily Poggi, Kaylene Pascal, MD   5 mg at 11/02/23 1003   magnesium  hydroxide (MILK OF MAGNESIA) suspension 30 mL  30 mL Oral Daily PRN Poggi, Kaylene Pascal, MD       methocarbamol  (ROBAXIN ) tablet 500 mg  500 mg Oral Q6H PRN Poggi, John J, MD   500 mg at 11/02/23 2129   Or   methocarbamol  (ROBAXIN ) injection 500 mg  500 mg Intravenous Q6H PRN Poggi, John J, MD       metoCLOPramide  (REGLAN ) tablet 5-10 mg  5-10 mg Oral Q8H PRN Poggi, John J, MD       Or   metoCLOPramide  (REGLAN ) injection 5-10 mg  5-10 mg Intravenous Q8H PRN Poggi, John J, MD       ondansetron  (ZOFRAN ) tablet 4 mg  4 mg Oral Q6H PRN Poggi, John J, MD       Or   ondansetron  (ZOFRAN ) injection 4 mg  4 mg Intravenous Q6H PRN Poggi, John J, MD       polyethylene glycol (MIRALAX  / GLYCOLAX ) packet 17 g  17 g Oral BID Brenna Cam, MD   17 g at 10/31/23 2150   potassium chloride  SA (KLOR-CON  M) CR tablet 20 mEq  20 mEq Oral Daily Poggi, John J, MD   20 mEq at 11/02/23 1003   predniSONE  (DELTASONE ) tablet 20 mg  20 mg Oral Q breakfast Poggi, John J, MD   20 mg at 11/02/23 0751   rosuvastatin  (CRESTOR ) tablet 10 mg  10 mg Oral Daily Poggi, Kaylene Pascal, MD   10 mg at 11/02/23 1003    senna-docusate (Senokot-S) tablet 2 tablet  2 tablet Oral BID Brenna Cam, MD   2 tablet at 11/02/23 1003   sodium phosphate  (FLEET) enema 1 enema  1 enema Rectal Once PRN Poggi, Kaylene Pascal, MD       tamsulosin  (FLOMAX ) capsule 0.4 mg  0.4 mg Oral QPC supper Poggi, John J, MD   0.4 mg at 11/02/23 1732     Discharge Medications: Please see discharge summary for a list of discharge medications.  Relevant Imaging Results:  Relevant Lab Results:   Additional Information SSN: [id number]  Alexandra Ice, RN

## 2023-11-03 NOTE — Plan of Care (Signed)
                                                     Palliative Care Progress Note   Patient Name: Nicholas Gross       Date: 11/03/2023 DOB: 1928/06/18  Age: 89 y.o. MRN#: [id number] Attending Physician: Brenna Cam, MD Primary Care Physician: Solomon Dupre, DO Admit Date: 10/28/2023  Extensive chart review completed including labs, vital signs, imaging, progress notes, orders, and available advanced directive documents from current and previous encounters.   As per chart review, patient is requesting patient transfer to a rehab facility.  TOC following closely for discharge planning.  No acute palliative needs at this time.  Thank you for allowing the Palliative Medicine Team to assist in the care of Nicholas Gross.  Judeen Nose L. Rebbeca Campi, DNP, FNP-BC Palliative Medicine Team  No charge

## 2023-11-03 NOTE — NC FL2 (Cosign Needed Addendum)
 [place]  MEDICAID FL2 LEVEL OF CARE FORM     IDENTIFICATION  Patient Name: Nicholas Gross Birthdate: [birth year] Sex: male Admission Date (Current Location): 10/28/2023  [facility] and [place] Number:  Chiropodist and Address:  [facility], [address] [place], [place] [address]      Provider Number:    Attending Physician Name and Address:  Brenna Cam, MD  Relative Name and Phone Number:  Sister: Rachael Budd    Current Level of Care: Hospital Recommended Level of Care: Skilled Nursing Facility Prior Approval Number:    Date Approved/Denied:   PASRR Number: [id number]  Discharge Plan: SNF    Current Diagnoses: Patient Active Problem List   Diagnosis Date Noted   Malnutrition of moderate degree 10/30/2023   Closed displaced fracture of left femoral neck (HCC) 10/29/2023   Dysarthria as late effect of cerebrovascular accident (CVA) 10/29/2023   Urinary tract infection 10/29/2023   Fracture of pubic ramus with delayed healing, unspecified laterality 10/05/2023   Bullous pemphigoid 10/05/2023   Functional quadriplegia (HCC) 10/05/2023   Hypernatremia 09/30/2023   Pressure injury of skin 09/30/2023   Fall 09/29/2023   Urinary retention 09/29/2023   Essential hypertension 09/29/2023   Hypokalemia 09/29/2023   Hematoma of extraperitoneal space 09/29/2023   Acute encephalopathy 09/28/2023   Weight loss 06/05/2023   Bullous disorder 06/01/2023   Mesenteric mass 06/01/2023   Dysarthria 04/17/2023   Tremor 04/17/2023   Aphasia 04/10/2023   Dysphagia 04/10/2023   History of falling 03/24/2023   Long term (current) use of aspirin  03/24/2023   Occlusion and stenosis of unspecified carotid artery 03/24/2023   Pain and swelling of lower leg 09/15/2022   Right acetabular fracture (HCC) 06/17/2022   Aortic stenosis, mild 04/25/2022   Cellulitis of both lower extremities 04/24/2022   Aneurysm of ascending aorta without  rupture (HCC) 12/23/2021   Compression fracture of L1 lumbar vertebra (HCC) 12/23/2021   Pulmonary nodule 12/23/2021   Right inguinal hernia 12/23/2021   Osteoarthritis of right hip 12/23/2021   Aortic atherosclerosis (HCC) 12/23/2021   Multiple falls 12/12/2021   Left rib fracture 12/12/2021   Senile purpura (HCC) 11/08/2021   PAD (peripheral artery disease) (HCC) 07/15/2020   Lymphedema 05/06/2019   Dyslipidemia 02/13/2016   Macular degeneration 08/15/2015   Carotid atherosclerosis 08/15/2015   Goals of care, counseling/discussion 02/12/2015   Allergic rhinitis due to pollen 02/12/2015   Hypertension    CAD (coronary artery disease)    H/O cardiac catheterization 06/12/2014   History of left-sided carotid endarterectomy 06/12/2014    Orientation RESPIRATION BLADDER Height & Weight     Self  Normal Incontinent Weight: 68 kg Height:  5\' 8"  (172.7 cm)  BEHAVIORAL SYMPTOMS/MOOD NEUROLOGICAL BOWEL NUTRITION STATUS      Incontinent Diet (Dysphagia 3, thin liquids)  AMBULATORY STATUS COMMUNICATION OF NEEDS Skin   Limited Assist Verbally Surgical wounds                       Personal Care Assistance Level of Assistance  Bathing, Feeding, Dressing Bathing Assistance: Limited assistance Feeding assistance: Limited assistance Dressing Assistance: Limited assistance     Functional Limitations Info             SPECIAL CARE FACTORS FREQUENCY                 Contractures Contractures Info: Not present    Additional Factors Info  Code Status, Allergies Code Status Info: DNR-limited  Allergies Info: Simvastatin            Current Medications (11/03/2023):  This is the current hospital active medication list Current Facility-Administered Medications  Medication Dose Route Frequency Provider Last Rate Last Admin   acetaminophen  (TYLENOL ) tablet 325-650 mg  325-650 mg Oral Q6H PRN Poggi, John J, MD   650 mg at 11/02/23 2129   bisacodyl  (DULCOLAX) suppository 10  mg  10 mg Rectal Daily PRN Poggi, John J, MD       diphenhydrAMINE  (BENADRYL ) 12.5 MG/5ML elixir 12.5-25 mg  12.5-25 mg Oral Q4H PRN Poggi, John J, MD   12.5 mg at 11/02/23 2142   doxycycline  (VIBRA -TABS) tablet 100 mg  100 mg Oral BID Poggi, John J, MD   100 mg at 11/02/23 2129   enoxaparin  (LOVENOX ) injection 40 mg  40 mg Subcutaneous Q24H Poggi, John J, MD   40 mg at 11/02/23 0751   feeding supplement (ENSURE ENLIVE / ENSURE PLUS) liquid 237 mL  237 mL Oral BID BM Brenna Cam, MD   237 mL at 11/02/23 1438   HYDROcodone -acetaminophen  (NORCO/VICODIN) 5-325 MG per tablet 1 tablet  1 tablet Oral Q6H PRN Brenna Cam, MD       lisinopril  (ZESTRIL ) tablet 5 mg  5 mg Oral Daily Poggi, Kaylene Pascal, MD   5 mg at 11/02/23 1003   magnesium  hydroxide (MILK OF MAGNESIA) suspension 30 mL  30 mL Oral Daily PRN Poggi, Kaylene Pascal, MD       methocarbamol  (ROBAXIN ) tablet 500 mg  500 mg Oral Q6H PRN Poggi, John J, MD   500 mg at 11/02/23 2129   Or   methocarbamol  (ROBAXIN ) injection 500 mg  500 mg Intravenous Q6H PRN Poggi, John J, MD       metoCLOPramide  (REGLAN ) tablet 5-10 mg  5-10 mg Oral Q8H PRN Poggi, John J, MD       Or   metoCLOPramide  (REGLAN ) injection 5-10 mg  5-10 mg Intravenous Q8H PRN Poggi, John J, MD       ondansetron  (ZOFRAN ) tablet 4 mg  4 mg Oral Q6H PRN Poggi, John J, MD       Or   ondansetron  (ZOFRAN ) injection 4 mg  4 mg Intravenous Q6H PRN Poggi, John J, MD       polyethylene glycol (MIRALAX  / GLYCOLAX ) packet 17 g  17 g Oral BID Brenna Cam, MD   17 g at 10/31/23 2150   potassium chloride  SA (KLOR-CON  M) CR tablet 20 mEq  20 mEq Oral Daily Poggi, John J, MD   20 mEq at 11/02/23 1003   predniSONE  (DELTASONE ) tablet 20 mg  20 mg Oral Q breakfast Poggi, John J, MD   20 mg at 11/02/23 0751   rosuvastatin  (CRESTOR ) tablet 10 mg  10 mg Oral Daily Poggi, Kaylene Pascal, MD   10 mg at 11/02/23 1003   senna-docusate (Senokot-S) tablet 2 tablet  2 tablet Oral BID Brenna Cam, MD   2 tablet at 11/02/23 1003    sodium phosphate  (FLEET) enema 1 enema  1 enema Rectal Once PRN Poggi, Kaylene Pascal, MD       tamsulosin  (FLOMAX ) capsule 0.4 mg  0.4 mg Oral QPC supper Poggi, John J, MD   0.4 mg at 11/02/23 1732     Discharge Medications: Please see discharge summary for a list of discharge medications.  Relevant Imaging Results:  Relevant Lab Results:   Additional Information SSN: [id number]  Alexandra Ice, RN

## 2023-11-03 NOTE — TOC Progression Note (Signed)
 Transition of Care [facility]) - Progression Note    Patient Details  Name: Nicholas Gross MRN: [id number] Date of Birth: [birth year]  Transition of Care [facility]) CM/SW Contact  Alexandra Ice, RN Phone Number: 11/03/2023, 9:14 AM  Clinical Narrative:    [place] [facility], and sent referral to [facility] via HUB. Sent message to [employer] inquiring if they are able to accept patient back today, awaiting response.        Expected Discharge Plan and Services                                               Social Determinants of Health (SDOH) Interventions SDOH Screenings   Food Insecurity: No Food Insecurity (10/29/2023)  Housing: Low Risk  (10/29/2023)  Transportation Needs: No Transportation Needs (10/29/2023)  Utilities: Not At Risk (10/29/2023)  Alcohol  Screen: Low Risk  (05/13/2021)  Depression (PHQ2-9): Low Risk  (07/06/2023)  Recent Concern: Depression (PHQ2-9) - Medium Risk (06/01/2023)  Financial Resource Strain: Low Risk  (04/20/2023)   Received from [facility]  Physical Activity: Unknown (05/13/2021)  Social Connections: Socially Isolated (10/29/2023)  Stress: No Stress Concern Present (05/13/2021)  Tobacco Use: Medium Risk (10/29/2023)    Readmission Risk Interventions     No data to display

## 2023-11-03 NOTE — Progress Notes (Signed)
 Physical Therapy Treatment Patient Details Name: Nicholas Gross MRN: [id number] DOB: 1927/07/29 Today's Date: 11/03/2023   History of Present Illness Nicholas Gross is 89 y.o. male who lost his balance and fell trying to sit down in his wheelchair.  Pt is s/p L hemiarthroplasty on 10/29/23.  PMH: CAD, HLD, PAD. Workup revealing of subactue Lt 7th rib fracture. Head/cervical CT negative.    PT Comments  Pt received seated in recliner upon arrival to room and pt agreeable to therapy.  Pt noted to be much more alert and ready to work with therapy.  Pt transferred well, with one moderate incident of instability when transferring to the bed.  Pt assisted to the bed and was able to participate in seated exercises.  Pt demonstrates poor core stability as expected, but overall did well with the exercises involving the LE's.  Pt then transferred back to the bed and needed modA to get repositioned properly.  Pt left with all needs met and call bell within reach.      If plan is discharge home, recommend the following: Two people to help with walking and/or transfers;A lot of help with bathing/dressing/bathroom;Help with stairs or ramp for entrance;Assist for transportation   Can travel by private vehicle        Equipment Recommendations  None recommended by PT    Recommendations for Other Services       Precautions / Restrictions Precautions Precautions: Fall;Posterior Hip Recall of Precautions/Restrictions: Impaired Restrictions Weight Bearing Restrictions Per Provider Order: Yes RLE Weight Bearing Per Provider Order: Weight bearing as tolerated LLE Weight Bearing Per Provider Order: Weight bearing as tolerated     Mobility  Bed Mobility Overal bed mobility: Needs Assistance Bed Mobility: Supine to Sit     Supine to sit: Min assist     General bed mobility comments: MIN A for moving to EOB    Transfers Overall transfer level: Needs assistance Equipment used: Rolling walker (2  wheels) Transfers: Sit to/from Stand, Bed to chair/wheelchair/BSC Sit to Stand: Min assist Stand pivot transfers: Min assist         General transfer comment: Pt able to perform STS and transfer to the bed.  At EOB, pt performed seated exercises.    Ambulation/Gait               General Gait Details: no formal gait at this time.   Stairs             Wheelchair Mobility     Tilt Bed    Modified Rankin (Stroke Patients Only)       Balance Overall balance assessment: Needs assistance Sitting-balance support: No upper extremity supported, Feet supported Sitting balance-[name]: Fair Sitting balance - Comments: slight R lateral lean (hx of R sided weakness from old CVA);   Standing balance support: Bilateral upper extremity supported Standing balance-[name]: Poor                              Communication Communication Communication: Impaired Factors Affecting Communication: Difficulty expressing self  Cognition Arousal: Alert Behavior During Therapy: Flat affect                             Following commands: Intact      Cueing Cueing Techniques: Verbal cues, Gestural cues, Tactile cues  Exercises Total Joint Exercises Hip ABduction/ADduction: AROM, Strengthening, Both, 10 reps, Seated (  into pillow for 3 seconds.) Long Arc Quad: AROM, Strengthening, Both, 10 reps, Seated Marching in Standing: AROM, Strengthening, Both, 10 reps, Seated    General Comments General comments (skin integrity, edema, etc.): Back was itching      Pertinent Vitals/Pain Pain Assessment Pain Assessment: Faces Faces Pain Scale: Hurts even more Pain Location: L LE Pain Descriptors / Indicators: Discomfort Pain Intervention(s): Repositioned, Monitored during session    Home Living                          Prior Function            PT Goals (current goals can now be found in the care plan section) Acute Rehab PT  Goals Patient Stated Goal: unable to establish due to pt having difficulty expressing himself PT Goal Formulation: With patient/family Time For Goal Achievement: 11/13/23 Progress towards PT goals: Progressing toward goals    Frequency    Min 2X/week      PT Plan      Co-evaluation              AM-PAC PT "6 Clicks" Mobility   Outcome Measure  Help needed turning from your back to your side while in a flat bed without using bedrails?: A Lot Help needed moving from lying on your back to sitting on the side of a flat bed without using bedrails?: A Lot Help needed moving to and from a bed to a chair (including a wheelchair)?: A Little Help needed standing up from a chair using your arms (e.g., wheelchair or bedside chair)?: A Lot Help needed to walk in hospital room?: Total Help needed climbing 3-5 steps with a railing? : Total 6 Click Score: 11    End of Session   Activity Tolerance: Patient tolerated treatment well Patient left: in bed;with call bell/phone within reach;with bed alarm set Nurse Communication: Mobility status PT Visit Diagnosis: Unsteadiness on feet (R26.81);Other abnormalities of gait and mobility (R26.89);Muscle weakness (generalized) (M62.81);Difficulty in walking, not elsewhere classified (R26.2)     Time: 1610-9604 PT Time Calculation (min) (ACUTE ONLY): 15 min  Charges:    $Therapeutic Activity: 8-22 mins PT General Charges $$ ACUTE PT VISIT: 1 Visit                     Rozanna Corner, PT, DPT Physical Therapist - [facility]  11/03/23, 5:37 PM

## 2023-11-03 NOTE — TOC Progression Note (Signed)
 Transition of Care [facility]) - Progression Note    Patient Details  Name: Nicholas Gross MRN: [id number] Date of Birth: [birth year]  Transition [facility] Care [facility]) CM/SW Contact  Alexandra Ice, RN Phone Number: 11/03/2023, 10:34 AM  Clinical Narrative:     LVM with sister, regarding discharge plan, provided contact for [facility] and requested call back. Spoke with niece, explained purpose of call. They would like for him to get rehab at SNF, will complete bedsearch and provide bed offers for patient.       Expected Discharge Plan and Services                                               Social Determinants of Health (SDOH) Interventions SDOH Screenings   Food Insecurity: No Food Insecurity (10/29/2023)  Housing: Low Risk  (10/29/2023)  Transportation Needs: No Transportation Needs (10/29/2023)  Utilities: Not At Risk (10/29/2023)  Alcohol  Screen: Low Risk  (05/13/2021)  Depression (PHQ2-9): Low Risk  (07/06/2023)  Recent Concern: Depression (PHQ2-9) - Medium Risk (06/01/2023)  Financial Resource Strain: Low Risk  (04/20/2023)   Received from [facility]  Physical Activity: Unknown (05/13/2021)  Social Connections: Socially Isolated (10/29/2023)  Stress: No Stress Concern Present (05/13/2021)  Tobacco Use: Medium Risk (10/29/2023)    Readmission Risk Interventions     No data to display

## 2023-11-04 DIAGNOSIS — S72002A Fracture of unspecified part of neck of left femur, initial encounter for closed fracture: Secondary | ICD-10-CM | POA: Diagnosis not present

## 2023-11-04 LAB — BASIC METABOLIC PANEL WITH GFR
Anion gap: 2 — ABNORMAL LOW (ref 5–15)
BUN: 31 mg/dL — ABNORMAL HIGH (ref 8–23)
CO2: 21 mmol/L — ABNORMAL LOW (ref 22–32)
Calcium: 7.9 mg/dL — ABNORMAL LOW (ref 8.9–10.3)
Chloride: 106 mmol/L (ref 98–111)
Creatinine, Ser: 0.83 mg/dL (ref 0.61–1.24)
GFR, Estimated: >60 mL/min (ref >60)
Glucose, Bld: 108 mg/dL — ABNORMAL HIGH (ref 70–99)
Potassium: 4.7 mmol/L (ref 3.5–5.1)
Sodium: 129 mmol/L — ABNORMAL LOW (ref 135–145)

## 2023-11-04 LAB — CBC
HCT: 23.1 % — ABNORMAL LOW (ref 39.0–52.0)
Hemoglobin: 7.7 g/dL — ABNORMAL LOW (ref 13.0–17.0)
MCH: 32.4 pg (ref 26.0–34.0)
MCHC: 33.3 g/dL (ref 30.0–36.0)
MCV: 97.1 fL (ref 80.0–100.0)
Platelets: 153 10*3/uL (ref 150–400)
RBC: 2.38 MIL/uL — ABNORMAL LOW (ref 4.22–5.81)
RDW: 13.1 % (ref 11.5–15.5)
WBC: 8 10*3/uL (ref 4.0–10.5)
nRBC: 0 % (ref 0.0–0.2)

## 2023-11-04 LAB — IRON AND TIBC
Iron: 36 ug/dL — ABNORMAL LOW (ref 45–182)
Saturation Ratios: 20 % (ref 17.9–39.5)
TIBC: 182 ug/dL — ABNORMAL LOW (ref 250–450)
UIBC: 146 ug/dL

## 2023-11-04 LAB — FERRITIN: Ferritin: 82 ng/mL (ref 24–336)

## 2023-11-04 MED ORDER — IRON SUCROSE 300 MG IVPB - SIMPLE MED
300.0000 mg | Freq: Once | Status: AC
  Administered 2023-11-04: 300 mg via INTRAVENOUS
  Filled 2023-11-04: qty 300

## 2023-11-04 NOTE — Progress Notes (Signed)
 Subjective: 6 Days Post-Op Procedure(s) (LRB): HEMIARTHROPLASTY (BIPOLAR) HIP, POSTERIOR APPROACH FOR FRACTURE (Left) Patient is eating with assistance from family member. Able to respond so simple questions. Patient is well, and has had no acute complaints or problems PT and care management to assist with discharge planning.  Current plan is for SNF with hospice care.  Bed search has been initiated. Negative for chest pain and shortness of breath Fever: no Gastrointestinal:Negative for nausea and vomiting  Objective: Vital signs in last 24 hours: Temp:  [97.5 F (36.4 C)-99.2 F (37.3 C)] 97.5 F (36.4 C) (05/14 0750) Pulse Rate:  [58-68] 58 (05/14 0750) Resp:  [15-17] 15 (05/14 0750) BP: (118-143)/(57-62) 143/58 (05/14 0750) SpO2:  [98 %-100 %] 98 % (05/14 0750)  Intake/Output from previous day:  Intake/Output Summary (Last 24 hours) at 11/04/2023 1324 Last data filed at 11/04/2023 1200 Gross per 24 hour  Intake 824 ml  Output 1700 ml  Net -876 ml    Intake/Output this shift: Total I/O In: 557 [P.O.:557] Out: 1250 [Urine:1250]  Labs: Recent Labs    11/02/23 0455 11/03/23 0454 11/04/23 0349  HGB 7.5* 8.0* 7.7*   Recent Labs    11/03/23 0454 11/04/23 0349  WBC 7.4 8.0  RBC 2.42* 2.38*  HCT 24.0* 23.1*  PLT 147* 153   Recent Labs    11/03/23 0454 11/04/23 0349  NA 133* 129*  K 4.5 4.7  CL 107 106  CO2 22 21*  BUN 31* 31*  CREATININE 0.76 0.83  GLUCOSE 102* 108*  CALCIUM  8.2* 7.9*   No results for input(s): "LABPT", "INR" in the last 72 hours.    EXAM General - Patient is Alert. Extremity - ABD soft Neurovascular intact Dorsiflexion/Plantar flexion intact Incision: dressing C/D/I No cellulitis present Dressing/Incision - clean, dry, no drainage noted to the left hip honeycomb dressing. Motor Function - intact, moving foot and toes well on exam.   Past Medical History:  Diagnosis Date   CAD (coronary artery disease)    History of GI bleed  2008   History of tobacco use    17 pack years, quit around 1970   Hyperlipidemia    Hypertension    Overweight     Assessment/Plan: 6 Days Post-Op Procedure(s) (LRB): HEMIARTHROPLASTY (BIPOLAR) HIP, POSTERIOR APPROACH FOR FRACTURE (Left) Principal Problem:   Closed displaced fracture of left femoral neck (HCC) Active Problems:   CAD (coronary artery disease)   Goals of care, counseling/discussion   PAD (peripheral artery disease) (HCC)   Fall   Urinary retention   Essential hypertension   Dysarthria as late effect of cerebrovascular accident (CVA)   Urinary tract infection   Malnutrition of moderate degree  Estimated body mass index is 22.79 kg/m as calculated from the following:   Height as of this encounter: 5\' 8"  (1.727 m).   Weight as of this encounter: 68 kg. Advance diet Up with therapy D/C IV fluids when tolerating po intake.  Labs and vitals reviewed. Hg 7.7, iron levels low at 36. continue to monitor.  Transfuse if drops below 7. WBC 8.0 this morning. Up with PT.  Current plan is for d/c to SNF with hospice care. Care management to assist with d/c planning, was at [facility] prior to fall. Patient has had a BM.  Staples can be removed by SNF on 11/12/23. Continue Lovenox  40mg  daily for 14 days for DVT prophylaxis. Follow-up with [facility] in 6 weeks for x-rays of the left hip.  DVT Prophylaxis - Lovenox  and  TED hose Weight-Bearing as tolerated to left leg  Standley Earing, PA-C [facility] 11/04/2023, 1:24 PM

## 2023-11-04 NOTE — Plan of Care (Signed)

## 2023-11-04 NOTE — TOC Progression Note (Signed)
 Transition of Care [facility]) - Progression Note    Patient Details  Name: Nicholas Gross MRN: [id number] Date of Birth: 09-Apr-1928  Transition of Care [facility]) CM/SW Contact  Alexandra Ice, RN Phone Number: 11/04/2023, 4:10 PM  Clinical Narrative:    Patient has pending authorization for [employer], Pending AuthID: [id number]         Expected Discharge Plan and Services                                               Social Determinants of Health (SDOH) Interventions SDOH Screenings   Food Insecurity: No Food Insecurity (10/29/2023)  Housing: Low Risk  (10/29/2023)  Transportation Needs: No Transportation Needs (10/29/2023)  Utilities: Not At Risk (10/29/2023)  Alcohol  Screen: Low Risk  (05/13/2021)  Depression (PHQ2-9): Low Risk  (07/06/2023)  Recent Concern: Depression (PHQ2-9) - Medium Risk (06/01/2023)  Financial Resource Strain: Low Risk  (04/20/2023)   Received from [facility]  Physical Activity: Unknown (05/13/2021)  Social Connections: Socially Isolated (10/29/2023)  Stress: No Stress Concern Present (05/13/2021)  Tobacco Use: Medium Risk (10/29/2023)    Readmission Risk Interventions     No data to display

## 2023-11-04 NOTE — TOC Progression Note (Signed)
 Transition of Care [facility]) - Progression Note    Patient Details  Name: RAYSEAN CATINELLA MRN: [id number] Date of Birth: 07-08-1927  Transition of Care [facility]) CM/SW Contact  Alexandra Ice, RN Phone Number: 11/04/2023, 3:25 PM  Clinical Narrative:      Met with patient and family at bedside, answered questions regarding rehab services provided at SNF. They chose [facility],accepted facility via hub. Sent text message to [employer], notifying them patient chose facility for rehab and will initiate auth.       Expected Discharge Plan and Services                                               Social Determinants of Health (SDOH) Interventions SDOH Screenings   Food Insecurity: No Food Insecurity (10/29/2023)  Housing: Low Risk  (10/29/2023)  Transportation Needs: No Transportation Needs (10/29/2023)  Utilities: Not At Risk (10/29/2023)  Alcohol  Screen: Low Risk  (05/13/2021)  Depression (PHQ2-9): Low Risk  (07/06/2023)  Recent Concern: Depression (PHQ2-9) - Medium Risk (06/01/2023)  Financial Resource Strain: Low Risk  (04/20/2023)   [facility]  Physical Activity: Unknown (05/13/2021)  Social Connections: Socially Isolated (10/29/2023)  Stress: No Stress Concern Present (05/13/2021)  Tobacco Use: Medium Risk (10/29/2023)    Readmission Risk Interventions     No data to display

## 2023-11-04 NOTE — Progress Notes (Signed)
 Physical Therapy Treatment Patient Details Name: Nicholas Gross MRN: [id number] DOB: 03/22/1928 Today's Date: 11/04/2023   History of Present Illness Khyran Engberg. Isaacson is 89 y.o. male who lost his balance and fell trying to sit down in his wheelchair.  Pt is s/p L hemiarthroplasty on 10/29/23.  PMH: CAD, HLD, PAD. Workup revealing of subactue Lt 7th rib fracture. Head/cervical CT negative.    PT Comments  Pt was long sitting in bed upon arrival with supportive sister at bedside. Pt is alert but difficult to understand due to mumbling speech. Per sister, "He always has the mumbling speech." Pt is able to fully participate and follow commands with increased time. Author struggles throughout session to understand pt's request/statements. He required more assistance today to safely exit bed and stand to RW 2 x. Session greatly limited due to pt having BM on third standing attempt.  RN tech assisted Chartered loss adjuster with hygiene care afterwards. Pt is far form his baseline. He will continue to benefit form skilled PT to maximize independence and safety with all ADLs. DC recs remain appropriate.    If plan is discharge home, recommend the following: Two people to help with walking and/or transfers;A lot of help with bathing/dressing/bathroom;Help with stairs or ramp for entrance;Assist for transportation     Equipment Recommendations  None recommended by PT       Precautions / Restrictions Precautions Precautions: Fall;Posterior Hip Precaution Booklet Issued: Yes (comment) Recall of Precautions/Restrictions: Impaired Restrictions Weight Bearing Restrictions Per Provider Order: Yes RLE Weight Bearing Per Provider Order: Weight bearing as tolerated LLE Weight Bearing Per Provider Order: Weight bearing as tolerated     Mobility  Bed Mobility Overal bed mobility: Needs Assistance Bed Mobility: Supine to Sit  Supine to sit: Mod assist, Used rails Sit to supine: Max assist, HOB elevated, Used  rails General bed mobility comments: pt required more assistance to exit bed today than previous date '    Transfers Overall transfer level: Needs assistance Equipment used: Rolling walker (2 wheels) Transfers: Sit to/from Stand Sit to Stand: Mod assist, Min assist, From elevated surface  General transfer comment: Min assist to stand form elevated bed height but mod assist from lowest bed height.    Ambulation/Gait  General Gait Details: pt did march 3 x alternating however unable to actually walk, take steps due to severity of pain in standing. pt puts forth great effort   Balance Overall balance assessment: Needs assistance Sitting-balance support: Bilateral upper extremity supported, Feet supported Sitting balance-[name]: Fair Sitting balance - Comments: Did have one occasion of posterior LOB in sitting   Standing balance support: Bilateral upper extremity supported, During functional activity, Reliant on assistive device for balance Standing balance-[name]: Poor Standing balance comment: pt remains high fall risk     Communication Communication Communication: Impaired Factors Affecting Communication: Difficulty expressing self  Cognition Arousal: Alert Behavior During Therapy: Flat affect   PT - Cognitive impairments: Difficult to assess (pt is extremely hard to understand. mumbling speech but is cooperative and agreeable to session.) Difficult to assess due to: Impaired communication    PT - Cognition Comments: Pt with limited speech and mumbling a lot.  Pt seems to have more mumbling speech later in the day after being fatigue however pt continues to have very mumbling speech Following commands: Intact      Cueing Cueing Techniques: Verbal cues         Pertinent Vitals/Pain Pain Assessment Pain Assessment: PAINAD Breathing: occasional labored breathing, short  period of hyperventilation Negative Vocalization: occasional moan/groan, low speech,  negative/disapproving quality Facial Expression: sad, frightened, frown Body Language: relaxed Consolability: no need to console PAINAD Score: 3 Pain Location: L LE Pain Descriptors / Indicators: Discomfort Pain Intervention(s): Limited activity within patient's tolerance, Monitored during session, Premedicated before session, Repositioned     PT Goals (current goals can now be found in the care plan section) Acute Rehab PT Goals Patient Stated Goal: unable to establish due to pt having difficulty expressing himself Progress towards PT goals: Not progressing toward goals - comment    Frequency    Min 2X/week       AM-PAC PT "6 Clicks" Mobility   Outcome Measure  Help needed turning from your back to your side while in a flat bed without using bedrails?: A Lot Help needed moving from lying on your back to sitting on the side of a flat bed without using bedrails?: A Lot Help needed moving to and from a bed to a chair (including a wheelchair)?: A Lot Help needed standing up from a chair using your arms (e.g., wheelchair or bedside chair)?: A Lot Help needed to walk in hospital room?: Total Help needed climbing 3-5 steps with a railing? : Total 6 Click Score: 10    End of Session   Activity Tolerance: Patient tolerated treatment well;Patient limited by pain Patient left: in bed;with call bell/phone within reach;with bed alarm set Nurse Communication: Mobility status PT Visit Diagnosis: Unsteadiness on feet (R26.81);Other abnormalities of gait and mobility (R26.89);Muscle weakness (generalized) (M62.81);Difficulty in walking, not elsewhere classified (R26.2)     Time: 1410-1437 PT Time Calculation (min) (ACUTE ONLY): 27 min  Charges:    $Therapeutic Activity: 23-37 mins PT General Charges $$ ACUTE PT VISIT: 1 Visit                    Chester Costa PTA 11/04/23, 3:54 PM

## 2023-11-04 NOTE — Progress Notes (Signed)
 PROGRESS NOTE    Nicholas Gross  ZOX:[id number] DOB: 30-Sep-1927 DOA: 10/28/2023 PCP: Solomon Dupre, DO    Brief Narrative:   89 y.o. male with medical history significant for HTN, CAD, PAD, bullous pemphigoid on chronic prednisone , CVA with residual right-sided weakness, frequent falls, chronic imbalance  recently admitted from 4/7 to 10/05/2023 with a fall with pelvic fracture and rhabdomyolysis managed nonsurgically, with stay complicated by acute urinary retention and altered mental status, discharged to SNF, who is being admitted with a left femoral neck fracture which he sustained from a likely accidental fall as he was trying to sit in his wheelchair.    5/8: Orthopedic consult, Left hip bipolar hemiarthroplasty.  5/9: PT and OT eval, HC POA paperwork 5/10: Scheduled bowel regimen added 5/11: Palliative care consult.  5/12: Diet advanced to dysphagia 3.  Work with PT and OT.  Waiting for placement 5/13: Family changed mind and would like to send him to SNF for now and hold off hospice     Assessment & Plan:   Principal Problem:   Closed displaced fracture of left femoral neck (HCC) Active Problems:   Fall   Urinary retention   Urinary tract infection   Essential hypertension   CAD (coronary artery disease)   Goals of care, counseling/discussion   PAD (peripheral artery disease) (HCC)   Dysarthria as late effect of cerebrovascular accident (CVA)   Malnutrition of moderate degree  Closed displaced fracture of left femoral neck (HCC) Status post left hip bipolar hemiarthroplasty on 5/8 DVT prophylaxis and pain management per Ortho.  Last BM on 5/12 Medically stable for discharge to skilled nursing facility   Acute urinary retention BPH Foley catheter being placed in the ED and removed on 5/9. Continue tamsulosin .  Purewick in place   Acute on chronic anemia Likely IntraOp/postop blood loss Possibly component of iron deficiency Plan: IV Venofer 300 mg x  1 Monitor CBC   Proteus mirabilis urinary tract infection Urinalysis consistent with UTI with positive leuks and nitrites Completed course of Rocephin  urine culture growing Proteus mirabilis more than 100,000 colonies   PAD (peripheral artery disease) (HCC) History of carotid endarterectomy Continue aspirin  and rosuvastatin    CAD (coronary artery disease) No complaints of chest pain and EKG nonacute Continue lisinopril , rosuvastatin  and aspirin    Hypertension Continue lisinopril    Dysarthria/aphasia secondary to old CVA Increase nursing assistance for communication of needs   Bullous pemphigoid  On chronic prednisone  and doxycycline    Goals of care Family would like to hold off hospice and pursue SNF for now   DVT prophylaxis: Lovenox  Code Status: DNR Family Communication: Spouse at bedside 5/14 Disposition Plan: Status is: Inpatient Remains inpatient appropriate because: Unsafe discharge plan.  Need skilled nursing facility.  Medically stable for discharge at this time.   Level of care: Med-Surg  Consultants:  Orthopedics Palliative care  Procedures:  Hip fracture repair  Antimicrobials: None   Subjective: Seen and Ament.  Resting comfortably in bed.  Spouse at bedside.  Patient no distress.  Objective: Vitals:   11/03/23 1422 11/03/23 2019 11/04/23 0350 11/04/23 0750  BP: (!) 120/57 123/62 118/62 (!) 143/58  Pulse: 67 68 66 (!) 58  Resp: 16 16 17 15   Temp: 99.2 F (37.3 C) 98.8 F (37.1 C) 98.5 F (36.9 C) (!) 97.5 F (36.4 C)  TempSrc:  Oral Oral   SpO2: 100% 100% 100% 98%  Weight:      Height:        Intake/Output  Summary (Last 24 hours) at 11/04/2023 1357 Last data filed at 11/04/2023 1200 Gross per 24 hour  Intake 824 ml  Output 1700 ml  Net -876 ml   Filed Weights   10/28/23 2100 10/29/23 1438  Weight: 68 kg 68 kg    Examination:  General exam: Appears calm and comfortable  Respiratory system: Clear to auscultation. Respiratory  effort normal. Cardiovascular system: S1-S2, RRR, no murmurs, no pedal edema Gastrointestinal system: Soft, NT/ND, normal bowel sounds Central nervous system: Alert and oriented x 2.  Dysarthric speech Extremities: Chronic right-sided weakness.  Left hip surgical dressing in place.  Dressing CDI Skin: No rashes, lesions or ulcers Psychiatry: Judgement and insight appear impaired. Mood & affect flattened.     Data Reviewed: I have personally reviewed following labs and imaging studies  CBC: Recent Labs  Lab 10/28/23 2347 10/30/23 0528 10/31/23 0451 11/01/23 0503 11/02/23 0455 11/03/23 0454 11/04/23 0349  WBC 12.8*   < > 10.2 8.8 6.8 7.4 8.0  NEUTROABS 10.6*  --   --   --   --   --   --   HGB 10.0*   < > 7.3* 7.5* 7.5* 8.0* 7.7*  HCT 29.0*   < > 21.8* 22.1* 22.6* 24.0* 23.1*  MCV 95.7   < > 97.3 96.1 98.3 99.2 97.1  PLT 180   < > 123* 137* 136* 147* 153   < > = values in this interval not displayed.   Basic Metabolic Panel: Recent Labs  Lab 10/31/23 0451 11/01/23 0503 11/02/23 0455 11/03/23 0454 11/04/23 0349  NA 134* 135 135 133* 129*  K 3.9 4.1 4.3 4.5 4.7  CL 103 103 109 107 106  CO2 25 24 22 22  21*  GLUCOSE 108* 108* 101* 102* 108*  BUN 33* 33* 31* 31* 31*  CREATININE 0.88 0.83 0.77 0.76 0.83  CALCIUM  7.8* 8.0* 7.9* 8.2* 7.9*   GFR: Estimated Creatinine Clearance: 52.3 mL/min (by C-G formula based on SCr of 0.83 mg/dL). Liver Function Tests: Recent Labs  Lab 10/28/23 2347  AST 20  ALT 26  ALKPHOS 109  BILITOT 1.1  PROT 5.5*  ALBUMIN 3.4*   No results for input(s): "LIPASE", "AMYLASE" in the last 168 hours. No results for input(s): "AMMONIA" in the last 168 hours. Coagulation Profile: Recent Labs  Lab 10/28/23 2347  INR 1.2   Cardiac Enzymes: No results for input(s): "CKTOTAL", "CKMB", "CKMBINDEX", "TROPONINI" in the last 168 hours. BNP (last 3 results) No results for input(s): "PROBNP" in the last 8760 hours. HbA1C: No results for input(s):  "HGBA1C" in the last 72 hours. CBG: No results for input(s): "GLUCAP" in the last 168 hours. Lipid Profile: No results for input(s): "CHOL", "HDL", "LDLCALC", "TRIG", "CHOLHDL", "LDLDIRECT" in the last 72 hours. Thyroid  Function Tests: No results for input(s): "TSH", "T4TOTAL", "FREET4", "T3FREE", "THYROIDAB" in the last 72 hours. Anemia Panel: Recent Labs    11/04/23 0349  FERRITIN 82  TIBC 182*  IRON 36*   Sepsis Labs: No results for input(s): "PROCALCITON", "LATICACIDVEN" in the last 168 hours.  Recent Results (from the past 240 hours)  Urine Culture     Status: Abnormal   Collection Time: 10/29/23  2:19 AM   Specimen: Urine, Random  Result Value Ref Range Status   Specimen Description   Final    URINE, RANDOM Performed at [facility], [address] [place], [place] [address]    Special Requests   Final    NONE Reflexed from [id number] Performed  at [facility], [address] [place], [place] [address]    Culture >=100,000 COLONIES/mL PROTEUS MIRABILIS (A)  Final   Report Status 10/31/2023 FINAL  Final   Organism ID, Bacteria PROTEUS MIRABILIS (A)  Final      Susceptibility   Proteus mirabilis - MIC*    AMPICILLIN >=32 RESISTANT Resistant     CEFAZOLIN  8 SENSITIVE Sensitive     CEFEPIME <=0.12 SENSITIVE Sensitive     CEFTRIAXONE  <=0.25 SENSITIVE Sensitive     CIPROFLOXACIN  <=0.25 SENSITIVE Sensitive     GENTAMICIN <=1 SENSITIVE Sensitive     IMIPENEM 2 SENSITIVE Sensitive     NITROFURANTOIN 128 RESISTANT Resistant     TRIMETH /SULFA  >=320 RESISTANT Resistant     AMPICILLIN/SULBACTAM 16 INTERMEDIATE Intermediate     PIP/TAZO <=4 SENSITIVE Sensitive ug/mL    * >=100,000 COLONIES/mL PROTEUS MIRABILIS         Radiology Studies: No results found.      Scheduled Meds:  doxycycline   100 mg Oral BID   enoxaparin  (LOVENOX ) injection  40 mg Subcutaneous Q24H   feeding supplement  237 mL Oral BID BM   lisinopril   5 mg Oral Daily    polyethylene glycol  17 g Oral BID   potassium chloride   20 mEq Oral Daily   predniSONE   20 mg Oral Q breakfast   rosuvastatin   10 mg Oral Daily   senna-docusate  2 tablet Oral BID   tamsulosin   0.4 mg Oral QPC supper   Continuous Infusions:   LOS: 6 days       Tiajuana Fluke, MD [facility]   If 7PM-7AM, please contact night-coverage  11/04/2023, 1:57 PM

## 2023-11-05 DIAGNOSIS — S72002A Fracture of unspecified part of neck of left femur, initial encounter for closed fracture: Secondary | ICD-10-CM | POA: Diagnosis not present

## 2023-11-05 LAB — CBC WITH DIFFERENTIAL/PLATELET
Abs Immature Granulocytes: 0.13 10*3/uL — ABNORMAL HIGH (ref 0.00–0.07)
Basophils Absolute: 0 10*3/uL (ref 0.0–0.1)
Basophils Relative: 0 %
Eosinophils Absolute: 0.3 10*3/uL (ref 0.0–0.5)
Eosinophils Relative: 3 %
HCT: 26.6 % — ABNORMAL LOW (ref 39.0–52.0)
Hemoglobin: 9 g/dL — ABNORMAL LOW (ref 13.0–17.0)
Immature Granulocytes: 1 %
Lymphocytes Relative: 9 %
Lymphs Abs: 0.9 10*3/uL (ref 0.7–4.0)
MCH: 33 pg (ref 26.0–34.0)
MCHC: 33.8 g/dL (ref 30.0–36.0)
MCV: 97.4 fL (ref 80.0–100.0)
Monocytes Absolute: 0.9 10*3/uL (ref 0.1–1.0)
Monocytes Relative: 9 %
Neutro Abs: 7.6 10*3/uL (ref 1.7–7.7)
Neutrophils Relative %: 78 %
Platelets: 194 10*3/uL (ref 150–400)
RBC: 2.73 MIL/uL — ABNORMAL LOW (ref 4.22–5.81)
RDW: 13.2 % (ref 11.5–15.5)
WBC: 9.8 10*3/uL (ref 4.0–10.5)
nRBC: 0 % (ref 0.0–0.2)

## 2023-11-05 LAB — BASIC METABOLIC PANEL WITH GFR
Anion gap: 5 (ref 5–15)
BUN: 26 mg/dL — ABNORMAL HIGH (ref 8–23)
CO2: 23 mmol/L (ref 22–32)
Calcium: 8.3 mg/dL — ABNORMAL LOW (ref 8.9–10.3)
Chloride: 105 mmol/L (ref 98–111)
Creatinine, Ser: 0.66 mg/dL (ref 0.61–1.24)
GFR, Estimated: >60 mL/min (ref >60)
Glucose, Bld: 90 mg/dL (ref 70–99)
Potassium: 4.5 mmol/L (ref 3.5–5.1)
Sodium: 133 mmol/L — ABNORMAL LOW (ref 135–145)

## 2023-11-05 MED ORDER — TAMSULOSIN HCL 0.4 MG PO CAPS: 0.4000 mg | ORAL_CAPSULE | Freq: Every day | ORAL | Status: AC

## 2023-11-05 NOTE — TOC Transition Note (Signed)
 Transition of Care [facility]) - Discharge Note   Patient Details  Name: Nicholas Gross MRN: [id number] Date of Birth: 23-Apr-1928  [facility]) CM/SW Contact:  Alexandra Ice, RN Phone Number: 11/05/2023, 1:18 PM   Clinical Narrative:    Patient received approval for [employer], notified facility, and inquiring if they are able to accept patient. Received response they are able to accept patient today. Notified MD and bedside nurse. Discharge summary and orders sent to facility via HUB. Spoke with Blaise Bumps at [employer] for pick up in a hour. EMS packet printed to nurse station. Patient going to room. 303A, report number [phone], provided to bedside nurse.    Final next level of care: Skilled Nursing Facility Barriers to Discharge: Barriers Resolved   Patient Goals and CMS Choice Patient states their goals for this hospitalization and ongoing recovery are:: get better CMS Medicare.gov Compare Post Acute Care list provided to:: Patient Choice offered to / list presented to : Patient      Discharge Placement              Patient [facility] bed at: [facility] Patient to be transferred to facility by: [facility] Name of family member notified: Clerance Dais Patient and family notified of of transfer: 11/05/23  Discharge Plan and Services Additional resources added to the After Visit Summary for                    DME Agency: NA       HH Arranged: NA          Social Drivers of Health (SDOH) Interventions SDOH Screenings   Food Insecurity: No Food Insecurity (10/29/2023)  Housing: Low Risk  (10/29/2023)  Transportation Needs: No Transportation Needs (10/29/2023)  Utilities: Not At Risk (10/29/2023)  Alcohol  Screen: Low Risk  (05/13/2021)  Depression (PHQ2-9): Low Risk  (07/06/2023)  Recent Concern: Depression (PHQ2-9) - Medium Risk (06/01/2023)  Financial Resource Strain: Low Risk  (04/20/2023)   Received from [facility]  Physical Activity: Unknown (05/13/2021)  Social Connections: Socially Isolated (10/29/2023)  Stress: No Stress Concern Present (05/13/2021)  Tobacco Use: Medium Risk (10/29/2023)     Readmission Risk Interventions     No data to display

## 2023-11-05 NOTE — Progress Notes (Signed)
 Subjective: 7 Days Post-Op Procedure(s) (LRB): HEMIARTHROPLASTY (BIPOLAR) HIP, POSTERIOR APPROACH FOR FRACTURE (Left) Patient is eating with assistance from family member. Able to respond so simple questions. Patient is well, and has had no acute complaints or problems PT and care management to assist with discharge planning.  Current plan is for SNF with hospice care.  Bed search has been initiated. Negative for chest pain and shortness of breath Fever: no Gastrointestinal:Negative for nausea and vomiting  Objective: Vital signs in last 24 hours: Temp:  [97.6 F (36.4 C)-98.5 F (36.9 C)] 98.1 F (36.7 C) (05/15 0749) Pulse Rate:  [54-65] 57 (05/15 0749) Resp:  [15-16] 15 (05/15 0749) BP: (126-139)/(60-71) 136/71 (05/15 0749) SpO2:  [98 %-100 %] 100 % (05/15 0749)  Intake/Output from previous day:  Intake/Output Summary (Last 24 hours) at 11/05/2023 1313 Last data filed at 11/04/2023 2300 Gross per 24 hour  Intake 1284 ml  Output 850 ml  Net 434 ml    Intake/Output this shift: No intake/output data recorded.  Labs: Recent Labs    11/03/23 0454 11/04/23 0349 11/05/23 0809  HGB 8.0* 7.7* 9.0*   Recent Labs    11/04/23 0349 11/05/23 0809  WBC 8.0 9.8  RBC 2.38* 2.73*  HCT 23.1* 26.6*  PLT 153 194   Recent Labs    11/04/23 0349 11/05/23 0809  NA 129* 133*  K 4.7 4.5  CL 106 105  CO2 21* 23  BUN 31* 26*  CREATININE 0.83 0.66  GLUCOSE 108* 90  CALCIUM  7.9* 8.3*   No results for input(s): "LABPT", "INR" in the last 72 hours.    EXAM General - Patient is Alert. Extremity - ABD soft Neurovascular intact Dorsiflexion/Plantar flexion intact Incision: dressing C/D/I No cellulitis present Dressing/Incision - clean, dry, no drainage noted to the left hip honeycomb dressing. Motor Function - intact, moving foot and toes well on exam.   Past Medical History:  Diagnosis Date   CAD (coronary artery disease)    History of GI bleed 2008   History of tobacco  use    17 pack years, quit around 1970   Hyperlipidemia    Hypertension    Overweight     Assessment/Plan: 7 Days Post-Op Procedure(s) (LRB): HEMIARTHROPLASTY (BIPOLAR) HIP, POSTERIOR APPROACH FOR FRACTURE (Left) Principal Problem:   Closed displaced fracture of left femoral neck (HCC) Active Problems:   CAD (coronary artery disease)   Goals of care, counseling/discussion   PAD (peripheral artery disease) (HCC)   Fall   Urinary retention   Essential hypertension   Dysarthria as late effect of cerebrovascular accident (CVA)   Urinary tract infection   Malnutrition of moderate degree  Estimated body mass index is 22.79 kg/m as calculated from the following:   Height as of this encounter: 5\' 8"  (1.727 m).   Weight as of this encounter: 68 kg. Advance diet Up with therapy D/C IV fluids when tolerating po intake.  Labs and vitals reviewed. Hg 9.0 after iron infusion Up with PT.  Current plan is for d/c to SNF with hospice care. Care management to assist with d/c planning, was at [facility] prior to fall. Patient has had a BM.  Staples can be removed by SNF on 11/12/23. Continue Lovenox  40mg  daily for 14 days for DVT prophylaxis. Follow-up with [facility] in 6 weeks for x-rays of the left hip.  Orthopedics will sign off at this juncture and follow patient care from afar. Feel free to re-consult if needed  DVT Prophylaxis - Lovenox   and TED hose Weight-Bearing as tolerated to left leg  Standley Earing, PA-C [facility] 11/05/2023, 1:13 PM

## 2023-11-05 NOTE — Discharge Summary (Signed)
 Physician Discharge Summary  Nicholas Gross WUJ:[id number] DOB: [birth year] DOA: 10/28/2023  PCP: Solomon Dupre, DO  Admit date: 10/28/2023 Discharge date: 11/05/2023  Admitted From: Home Disposition:  SNF  Recommendations for Outpatient Follow-up:  Follow up with PCP in 1-2 weeks Follow up with ortho 1-2 weeks  Home Health:No  Equipment/Devices:None   Discharge Condition:Stable  CODE STATUS:DNR  Diet recommendation: Reg  Brief/Interim Summary:  89 y.o. male with medical history significant for HTN, CAD, PAD, bullous pemphigoid on chronic prednisone , CVA with residual right-sided weakness, frequent falls, chronic imbalance  recently admitted from 4/7 to 10/05/2023 with a fall with pelvic fracture and rhabdomyolysis managed nonsurgically, with stay complicated by acute urinary retention and altered mental status, discharged to SNF, who is being admitted with a left femoral neck fracture which he sustained from a likely accidental fall as he was trying to sit in his wheelchair.    5/8: Orthopedic consult, Left hip bipolar hemiarthroplasty.  5/9: PT and OT eval, HC POA paperwork 5/10: Scheduled bowel regimen added 5/11: Palliative care consult.  5/12: Diet advanced to dysphagia 3.  Work with PT and OT.  Waiting for placement 5/13: Family changed mind and would like to send him to SNF for now and hold off hospice 5/15: Ins auth obtained.  Stable and discharged to SNF       Discharge Diagnoses:  Principal Problem:   Closed displaced fracture of left femoral neck (HCC) Active Problems:   Fall   Urinary retention   Urinary tract infection   Essential hypertension   CAD (coronary artery disease)   Goals of care, counseling/discussion   PAD (peripheral artery disease) (HCC)   Dysarthria as late effect of cerebrovascular accident (CVA)   Malnutrition of moderate degree  Closed displaced fracture of left femoral neck (HCC) Status post left hip bipolar hemiarthroplasty on  5/8 DVT prophylaxis and pain management per Ortho.  Last BM on 5/12 Medically stable for discharge to skilled nursing facility Ins auth obtained   Acute urinary retention BPH Foley catheter being placed in the ED and removed on 5/9. Continue tamsulosin .     Acute on chronic anemia Likely IntraOp/postop blood loss Possibly component of iron deficiency Plan: S/p IV venofer x 1 Outpatient followup recommended   Proteus mirabilis urinary tract infection Urinalysis consistent with UTI with positive leuks and nitrites Completed course of Rocephin  urine culture growing Proteus mirabilis more than 100,000 colonies   PAD (peripheral artery disease) (HCC) History of carotid endarterectomy Continue aspirin  and rosuvastatin    CAD (coronary artery disease) No complaints of chest pain and EKG nonacute Continue lisinopril , rosuvastatin  and aspirin    Hypertension Continue lisinopril    Dysarthria/aphasia secondary to old CVA Increase nursing assistance for communication of needs   Bullous pemphigoid  On chronic prednisone  and doxycycline   Goals of care Family would like to hold off hospice and pursue SNF for now   Discharge Instructions  Discharge Instructions     Diet - low sodium heart healthy   Complete by: As directed    Increase activity slowly   Complete by: As directed       Allergies as of 11/05/2023       Reactions   Other    Patient states that he is allergic to something that they place in the IV before they work on you   Simvastatin  Other (See Comments)   Myalgia        Medication List     TAKE these medications  acetaminophen  325 MG tablet Commonly known as: Tylenol  Take 2 tablets (650 mg total) by mouth every 4 (four) hours as needed for moderate pain (pain score 4-6).   aspirin  EC 81 MG tablet Take 81 mg by mouth daily. Swallow whole.   clobetasol  ointment 0.05 % Commonly known as: TEMOVATE  Apply the medication twice daily to stubborn  areas of the skin until smooth. Then stop and re-start as the skin changes come back.   cyanocobalamin  250 MCG tablet Commonly known as: VITAMIN B12 Take by mouth.   doxycycline  100 MG tablet Commonly known as: VIBRA -TABS Take 100 mg by mouth 2 (two) times daily.   enoxaparin  40 MG/0.4ML injection Commonly known as: LOVENOX  Inject 0.4 mLs (40 mg total) into the skin daily.   furosemide  40 MG tablet Commonly known as: LASIX  Take 1 tablet (40 mg total) by mouth daily.   HYDROcodone -acetaminophen  5-325 MG tablet Commonly known as: NORCO/VICODIN Take 1 tablet by mouth every 6 (six) hours as needed for moderate pain (pain score 4-6) or severe pain (pain score 7-10).   lisinopril  5 MG tablet Commonly known as: ZESTRIL  Take 1 tablet (5 mg total) by mouth daily.   potassium chloride  10 MEQ CR capsule Commonly known as: MICRO-K  Take 10 mEq by mouth daily.   predniSONE  10 MG tablet Commonly known as: DELTASONE  Take 20 mg by mouth daily with breakfast.   PRESERVISION AREDS PO Take 1 tablet by mouth 2 (two) times daily.   Refresh Tears 0.5 % Soln Generic drug: carboxymethylcellulose Apply 1 drop to eye 3 (three) times daily as needed.   rosuvastatin  10 MG tablet Commonly known as: CRESTOR  Take 1 tablet (10 mg total) by mouth daily.   tamsulosin  0.4 MG Caps capsule Commonly known as: FLOMAX  Take 1 capsule (0.4 mg total) by mouth daily after supper.   White Petroleum Jelly Gel Apply topically.        Follow-up Information     Rojelio Clement, PA-C Follow up in 6 week(s).   Specialty: Physician Assistant Why: x-rays of the left hip. Contact information: [address] [place] [address] [phone]         Terre Ferri P, DO. Schedule an appointment as soon as possible for a visit in 1 week(s).   Specialty: Family Medicine Why: [facility] Discharge F/UP Contact information: [address] [place] [address] [phone]                 Allergies  Allergen Reactions   Other     Patient states that he is allergic to something that they place in the IV before they work on you   Simvastatin  Other (See Comments)    Myalgia     Consultations: Ortho   Procedures/Studies: DG HIP UNILAT W OR W/O PELVIS 2-3 VIEWS LEFT Result Date: 10/29/2023 CLINICAL DATA:  Status post left hip hemiarthroplasty. EXAM: DG HIP (WITH OR WITHOUT PELVIS) 2-3V LEFT COMPARISON:  Left hip radiographs dated 10/28/2023. FINDINGS: Status post left hip hemiarthroplasty with appropriate alignment. Right superior and inferior pubic rami fractures are again noted. Diffuse osseous demineralization. Peripheral vascular calcifications. Expected postoperative soft tissue swelling, soft tissue air, and cutaneous staples along the left hip. IMPRESSION: 1. Expected postoperative changes status post left hip hemiarthroplasty with appropriate alignment. 2. Right superior and inferior pubic rami fractures are again noted. Electronically Signed   By: Mannie Seek M.D.   On: 10/29/2023 18:52   CT Head Wo Contrast Result Date: 10/29/2023 CLINICAL DATA:  Head trauma, minor (Age >= 65y); Neck trauma (Age >= 65y) EXAM: CT HEAD WITHOUT CONTRAST CT CERVICAL SPINE WITHOUT CONTRAST TECHNIQUE: Multidetector CT imaging of the head and cervical spine was performed following the standard protocol without intravenous contrast. Multiplanar CT image reconstructions of the cervical spine were also generated. RADIATION DOSE REDUCTION: This exam was performed according to the departmental dose-optimization program which includes automated exposure control, adjustment of the mA and/or kV according to patient size and/or use of iterative reconstruction technique. COMPARISON:  None Available. FINDINGS: CT HEAD FINDINGS Brain: No evidence of acute infarction, hemorrhage, hydrocephalus, extra-axial collection or mass lesion/mass effect. Cerebral atrophy. Vascular: No hyperdense vessel. Skull: No  acute fracture. Sinuses/Orbits: Clear sinuses.  No acute orbital findings. CT CERVICAL SPINE FINDINGS Alignment: Similar mild anterolisthesis of C4 on C5. Otherwise, no substantial sagittal subluxation. Skull base and vertebrae: No evidence of acute fracture. Mild height loss of multiple upper thoracic vertebral bodies, similar to prior. Soft tissues and spinal canal: No prevertebral fluid or swelling. No visible canal hematoma. Disc levels:  Similar multilevel degenerative change. Upper chest: Visualized lung apices are clear. IMPRESSION: No acute intracranial or cervical spine finding. Electronically Signed   By: Stevenson Elbe M.D.   On: 10/29/2023 01:15   CT Cervical Spine Wo Contrast Result Date: 10/29/2023 CLINICAL DATA:  Head trauma, minor (Age >= 65y); Neck trauma (Age >= 65y) EXAM: CT HEAD WITHOUT CONTRAST CT CERVICAL SPINE WITHOUT CONTRAST TECHNIQUE: Multidetector CT imaging of the head and cervical spine was performed following the standard protocol without intravenous contrast. Multiplanar CT image reconstructions of the cervical spine were also generated. RADIATION DOSE REDUCTION: This exam was performed according to the departmental dose-optimization program which includes automated exposure control, adjustment of the mA and/or kV according to patient size and/or use of iterative reconstruction technique. COMPARISON:  None Available. FINDINGS: CT HEAD FINDINGS Brain: No evidence of acute infarction, hemorrhage, hydrocephalus, extra-axial collection or mass lesion/mass effect. Cerebral atrophy. Vascular: No hyperdense vessel. Skull: No acute fracture. Sinuses/Orbits: Clear sinuses.  No acute orbital findings. CT CERVICAL SPINE FINDINGS Alignment: Similar mild anterolisthesis of C4 on C5. Otherwise, no substantial sagittal subluxation. Skull base and vertebrae: No evidence of acute fracture. Mild height loss of multiple upper thoracic vertebral bodies, similar to prior. Soft tissues and spinal  canal: No prevertebral fluid or swelling. No visible canal hematoma. Disc levels:  Similar multilevel degenerative change. Upper chest: Visualized lung apices are clear. IMPRESSION: No acute intracranial or cervical spine finding. Electronically Signed   By: Stevenson Elbe M.D.   On: 10/29/2023 01:15   DG Chest 1 View Result Date: 10/28/2023 CLINICAL DATA:  Fall EXAM: CHEST  1 VIEW COMPARISON:  03/16/2023 FINDINGS: Heart and mediastinal contours are within normal limits. No focal opacities or effusions. No acute bony abnormality. Aortic atherosclerosis. IMPRESSION: No active disease. Electronically Signed   By: Janeece Mechanic M.D.   On: 10/28/2023 21:42   DG Hip Unilat W or Wo Pelvis 2-3 Views Left Result Date: 10/28/2023 CLINICAL DATA:  Injury, fall. EXAM: DG HIP (WITH OR WITHOUT PELVIS) 2-3V LEFT COMPARISON:  CT of the pelvis 09/28/2023. FINDINGS: The bones are diffusely osteopenic. Right superior and inferior pubic rami fractures are again seen. Evaluation is limited secondary to patient rotation. There is and new acute fracture through the left femoral neck, nondisplaced. There is no dislocation. Joint spaces are maintained. Peripheral vascular calcifications are present. IMPRESSION: 1. Acute nondisplaced fracture through the left femoral neck. 2. Right superior and inferior pubic  rami fractures are again seen. The Electronically Signed   By: Tyron Gallon M.D.   On: 10/28/2023 21:42      Subjective: Seen and examined on day of discharge.  Appropriate for transfer to SNF  Discharge Exam: Vitals:   11/05/23 0350 11/05/23 0749  BP: 139/63 136/71  Pulse: (!) 54 (!) 57  Resp: 16 15  Temp: 98.1 F (36.7 C) 98.1 F (36.7 C)  SpO2: 98% 100%   Vitals:   11/04/23 1451 11/04/23 2028 11/05/23 0350 11/05/23 0749  BP: 132/60 126/61 139/63 136/71  Pulse: 65 60 (!) 54 (!) 57  Resp: 15 16 16 15   Temp: 97.6 F (36.4 C) 98.5 F (36.9 C) 98.1 F (36.7 C) 98.1 F (36.7 C)  TempSrc:      SpO2: 100%  98% 98% 100%  Weight:      Height:        General: Pt is alert, awake, not in acute distress Cardiovascular: RRR, S1/S2 +, no rubs, no gallops Respiratory: CTA bilaterally, no wheezing, no rhonchi Abdominal: Soft, NT, ND, bowel sounds + Extremities: no edema, no cyanosis    The results of significant diagnostics from this hospitalization (including imaging, microbiology, ancillary and laboratory) are listed below for reference.     Microbiology: Recent Results (from the past 240 hours)  Urine Culture     Status: Abnormal   Collection Time: 10/29/23  2:19 AM   Specimen: Urine, Random  Result Value Ref Range Status   Specimen Description   Final    URINE, RANDOM Performed at [facility], [address] [place], [place] [address]    Special Requests   Final    NONE Reflexed from [id number] Performed at [facility], [address] [place], [place] [address]    Culture >=100,000 COLONIES/mL PROTEUS MIRABILIS (A)  Final   Report Status 10/31/2023 FINAL  Final   Organism ID, Bacteria PROTEUS MIRABILIS (A)  Final      Susceptibility   Proteus mirabilis - MIC*    AMPICILLIN >=32 RESISTANT Resistant     CEFAZOLIN  8 SENSITIVE Sensitive     CEFEPIME <=0.12 SENSITIVE Sensitive     CEFTRIAXONE  <=0.25 SENSITIVE Sensitive     CIPROFLOXACIN  <=0.25 SENSITIVE Sensitive     GENTAMICIN <=1 SENSITIVE Sensitive     IMIPENEM 2 SENSITIVE Sensitive     NITROFURANTOIN 128 RESISTANT Resistant     TRIMETH /SULFA  >=320 RESISTANT Resistant     AMPICILLIN/SULBACTAM 16 INTERMEDIATE Intermediate     PIP/TAZO <=4 SENSITIVE Sensitive ug/mL    * >=100,000 COLONIES/mL PROTEUS MIRABILIS     Labs: BNP (last 3 results) No results for input(s): "BNP" in the last 8760 hours. Basic Metabolic Panel: Recent Labs  Lab 11/01/23 0503 11/02/23 0455 11/03/23 0454 11/04/23 0349 11/05/23 0809  NA 135 135 133* 129* 133*  K 4.1 4.3 4.5 4.7 4.5  CL 103 109 107 106 105  CO2 24 22 22  21* 23   GLUCOSE 108* 101* 102* 108* 90  BUN 33* 31* 31* 31* 26*  CREATININE 0.83 0.77 0.76 0.83 0.66  CALCIUM  8.0* 7.9* 8.2* 7.9* 8.3*   Liver Function Tests: No results for input(s): "AST", "ALT", "ALKPHOS", "BILITOT", "PROT", "ALBUMIN" in the last 168 hours. No results for input(s): "LIPASE", "AMYLASE" in the last 168 hours. No results for input(s): "AMMONIA" in the last 168 hours. CBC: Recent Labs  Lab 11/01/23 0503 11/02/23 0455 11/03/23 0454 11/04/23 0349 11/05/23 0809  WBC 8.8 6.8 7.4 8.0 9.8  NEUTROABS  --   --   --   --  7.6  HGB 7.5* 7.5* 8.0* 7.7* 9.0*  HCT 22.1* 22.6* 24.0* 23.1* 26.6*  MCV 96.1 98.3 99.2 97.1 97.4  PLT 137* 136* 147* 153 194   Cardiac Enzymes: No results for input(s): "CKTOTAL", "CKMB", "CKMBINDEX", "TROPONINI" in the last 168 hours. BNP: Invalid input(s): "POCBNP" CBG: No results for input(s): "GLUCAP" in the last 168 hours. D-Dimer No results for input(s): "DDIMER" in the last 72 hours. Hgb A1c No results for input(s): "HGBA1C" in the last 72 hours. Lipid Profile No results for input(s): "CHOL", "HDL", "LDLCALC", "TRIG", "CHOLHDL", "LDLDIRECT" in the last 72 hours. Thyroid  function studies No results for input(s): "TSH", "T4TOTAL", "T3FREE", "THYROIDAB" in the last 72 hours.  Invalid input(s): "FREET3" Anemia work up Recent Labs    11/04/23 0349  FERRITIN 82  TIBC 182*  IRON 36*   Urinalysis    Component Value Date/Time   COLORURINE YELLOW (A) 10/29/2023 0219   APPEARANCEUR CLOUDY (A) 10/29/2023 0219   APPEARANCEUR Clear 02/02/2023 1632   LABSPEC 1.015 10/29/2023 0219   PHURINE 8.0 10/29/2023 0219   GLUCOSEU NEGATIVE 10/29/2023 0219   HGBUR NEGATIVE 10/29/2023 0219   BILIRUBINUR NEGATIVE 10/29/2023 0219   BILIRUBINUR Negative 02/02/2023 1632   KETONESUR NEGATIVE 10/29/2023 0219   PROTEINUR NEGATIVE 10/29/2023 0219   NITRITE POSITIVE (A) 10/29/2023 0219   LEUKOCYTESUR LARGE (A) 10/29/2023 0219   Sepsis Labs Recent Labs  Lab  11/02/23 0455 11/03/23 0454 11/04/23 0349 11/05/23 0809  WBC 6.8 7.4 8.0 9.8   Microbiology Recent Results (from the past 240 hours)  Urine Culture     Status: Abnormal   Collection Time: 10/29/23  2:19 AM   Specimen: Urine, Random  Result Value Ref Range Status   Specimen Description   Final    URINE, RANDOM Performed at [facility], [address], [place], [place] [address]    Special Requests   Final    NONE Reflexed from [id number] Performed at [facility], [address] [place], [place] [address]    Culture >=100,000 COLONIES/mL PROTEUS MIRABILIS (A)  Final   Report Status 10/31/2023 FINAL  Final   Organism ID, Bacteria PROTEUS MIRABILIS (A)  Final      Susceptibility   Proteus mirabilis - MIC*    AMPICILLIN >=32 RESISTANT Resistant     CEFAZOLIN  8 SENSITIVE Sensitive     CEFEPIME <=0.12 SENSITIVE Sensitive     CEFTRIAXONE  <=0.25 SENSITIVE Sensitive     CIPROFLOXACIN  <=0.25 SENSITIVE Sensitive     GENTAMICIN <=1 SENSITIVE Sensitive     IMIPENEM 2 SENSITIVE Sensitive     NITROFURANTOIN 128 RESISTANT Resistant     TRIMETH /SULFA  >=320 RESISTANT Resistant     AMPICILLIN/SULBACTAM 16 INTERMEDIATE Intermediate     PIP/TAZO <=4 SENSITIVE Sensitive ug/mL    * >=100,000 COLONIES/mL PROTEUS MIRABILIS     Time coordinating discharge: Over 30 minutes  SIGNED:   Tiajuana Fluke, MD  [facility] 11/05/2023, 1:00 PM Pager   If 7PM-7AM, please contact night-coverage

## 2023-11-05 NOTE — Progress Notes (Signed)
 Occupational Therapy Treatment Patient Details Name: Nicholas Gross MRN: [id number] DOB: [birth year] Today's Date: 11/05/2023   History of present illness Nicholas Gross. Nicholas Gross is 89 y.o. male who lost his balance and fell trying to sit down in his wheelchair.  Pt is s/p L hemiarthroplasty on 10/29/23.  PMH: CAD, HLD, PAD. Workup revealing of subactue Lt 7th rib fracture. Head/cervical CT negative.   OT comments  Nicholas Gross was seen for OT treatment on this date. Upon arrival to room pt in bed, agreeable to tx. Pt requires MIN A bed mobility. MAX A don B socks, SETUP self-drinking in sitting. MIN A + RW for bed>chair step pivot t/f. Improved clarity of speech this session, significantly increased time to make words understood. Skin tear noted to L elbow, RN notified. Pt making good progress toward goals, will continue to follow POC. Discharge recommendation remains appropriate.        If plan is discharge home, recommend the following:  A lot of help with walking and/or transfers;Supervision due to cognitive status;Help with stairs or ramp for entrance;Two people to help with bathing/dressing/bathroom;Assist for transportation;Assistance with cooking/housework   Equipment Recommendations  Other (comment)    Recommendations for Other Services      Precautions / Restrictions Precautions Precautions: Fall;Posterior Hip Restrictions Weight Bearing Restrictions Per Provider Order: Yes RLE Weight Bearing Per Provider Order: Weight bearing as tolerated LLE Weight Bearing Per Provider Order: Weight bearing as tolerated       Mobility Bed Mobility Overal bed mobility: Needs Assistance Bed Mobility: Supine to Sit     Supine to sit: Min assist          Transfers Overall transfer level: Needs assistance Equipment used: Rolling walker (2 wheels) Transfers: Bed to chair/wheelchair/BSC Sit to Stand: Min assist Stand pivot transfers: Min assist               Balance Overall  balance assessment: Needs assistance Sitting-balance support: Single extremity supported, Feet supported Sitting balance-[name]: Fair     Standing balance support: Bilateral upper extremity supported Standing balance-[name]: Poor                             ADL either performed or assessed with clinical judgement   ADL Overall ADL's : Needs assistance/impaired                                       General ADL Comments: MAX A don B socks in sitting. SETUP self-drinking in sitting. MIN A + RW for simulated BSC t/f    Extremity/Trunk Assessment              Vision       Perception     Praxis     Communication Communication Communication: Impaired Factors Affecting Communication: Difficulty expressing self   Cognition Arousal: Alert Behavior During Therapy: Flat affect Cognition: Difficult to assess Difficult to assess due to: Impaired communication           OT - Cognition Comments: hx of expressive aphasia                 Following commands: Intact        Cueing   Cueing Techniques: Verbal cues  Exercises      Shoulder Instructions       General Comments  Pertinent Vitals/ Pain       Pain Assessment Pain Assessment: PAINAD Breathing: normal Negative Vocalization: none Facial Expression: smiling or inexpressive Body Language: relaxed Consolability: no need to console PAINAD Score: 0  Home Living                                          Prior Functioning/Environment              Frequency  Min 2X/week        Progress Toward Goals  OT Goals(current goals can now be found in the care plan section)  Progress towards OT goals: Progressing toward goals  Acute Rehab OT Goals OT Goal Formulation: With family Time For Goal Achievement: 11/17/23 Potential to Achieve Goals: Fair ADL Goals Pt Will Perform Grooming: sitting;with min assist Pt Will Perform Lower Body  Dressing: with min assist;sit to/from stand Pt Will Transfer to Toilet: with contact guard assist;stand pivot transfer;bedside commode  Plan      Co-evaluation                 AM-PAC OT "6 Clicks" Daily Activity     Outcome Measure   Help from another person eating meals?: A Little Help from another person taking care of personal grooming?: A Little Help from another person toileting, which includes using toliet, bedpan, or urinal?: A Lot Help from another person bathing (including washing, rinsing, drying)?: A Lot Help from another person to put on and taking off regular upper body clothing?: A Little Help from another person to put on and taking off regular lower body clothing?: A Lot 6 Click Score: 15    End of Session    OT Visit Diagnosis: Other abnormalities of gait and mobility (R26.89);Muscle weakness (generalized) (M62.81)   Activity Tolerance Patient tolerated treatment well   Patient Left in chair;with call bell/phone within reach;with chair alarm set   Nurse Communication Mobility status        Time: 1125-1140 OT Time Calculation (min): 15 min  Charges: OT General Charges $OT Visit: 1 Visit OT Treatments $Self Care/Home Management : 8-22 mins  Nicholas Gross, M.S. OTR/L  11/05/23, 12:53 PM  ascom [phone]

## 2023-11-05 NOTE — TOC Progression Note (Signed)
 Transition of Care [facility]) - Progression Note    Patient Details  Name: Nicholas Gross MRN: [id number] Date of Birth: October 16, 1927  Transition of Care [facility]) CM/SW Contact  Alexandra Ice, RN Phone Number: 11/05/2023, 12:47 PM  Clinical Narrative:      Received approval for [employer],  PlanAuthID:  [id number]    Dates:  5/15-5/19/25  Next Review Date:  11/09/2023, sent message to [facility] inquiring if they are able to accept patient today, awaiting response.  9 mins NP         Expected Discharge Plan and Services                                               Social Determinants of Health (SDOH) Interventions SDOH Screenings   Food Insecurity: No Food Insecurity (10/29/2023)  Housing: Low Risk  (10/29/2023)  Transportation Needs: No Transportation Needs (10/29/2023)  Utilities: Not At Risk (10/29/2023)  Alcohol  Screen: Low Risk  (05/13/2021)  Depression (PHQ2-9): Low Risk  (07/06/2023)  Recent Concern: Depression (PHQ2-9) - Medium Risk (06/01/2023)  Financial Resource Strain: Low Risk  (04/20/2023)   Received from [facility]  Physical Activity: Unknown (05/13/2021)  Social Connections: Socially Isolated (10/29/2023)  Stress: No Stress Concern Present (05/13/2021)  Tobacco Use: Medium Risk (10/29/2023)    Readmission Risk Interventions     No data to display

## 2023-11-08 IMAGING — CT CT CHEST W/O CM
2 of 4 series · 13 of 36 positions shown, 16 images · non-contrast
Comparison: Rib series from December 12, 2021.

CLINICAL DATA: Post fall with LEFT chest pain.



[Series 2: thorax · axial · 0.69mm/px · z∈[-385,-67]mm · 10 of 189 slices shown, 13 images]
[im 15/189  mediastinal]
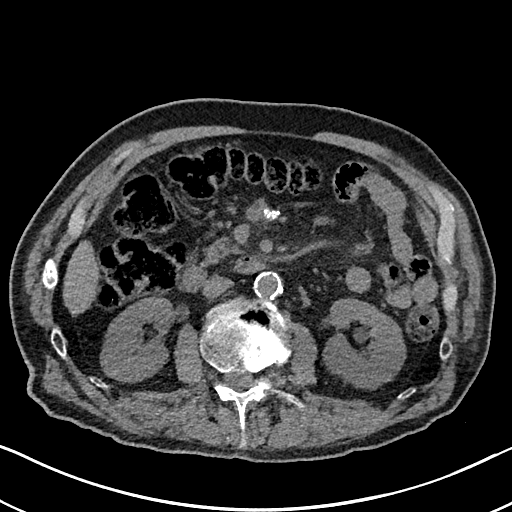
[im 15/189  lung]
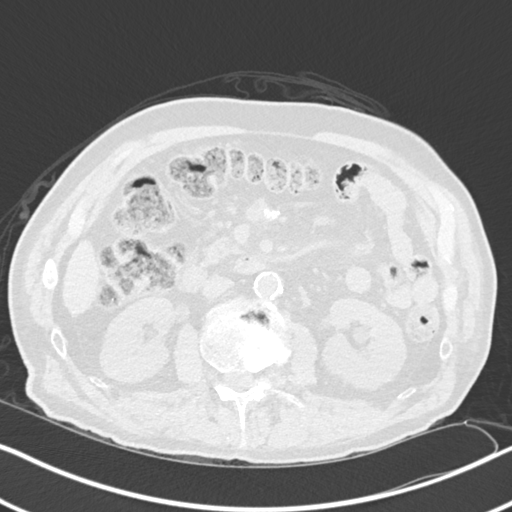
[im 29/189  lung]
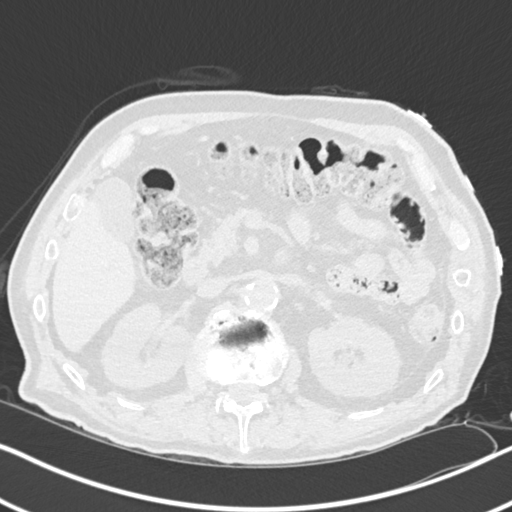
[im 58/189  lung]
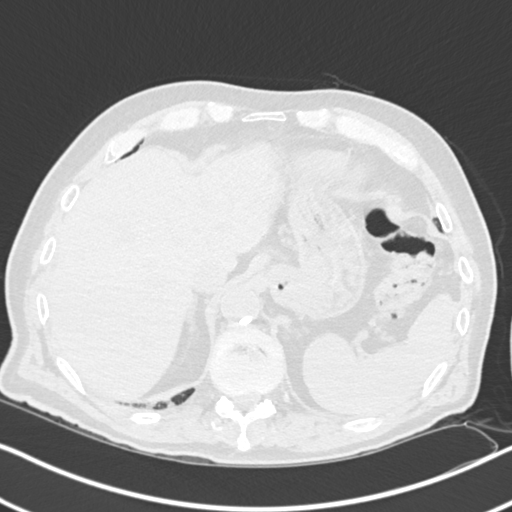
[im 73/189  lung]
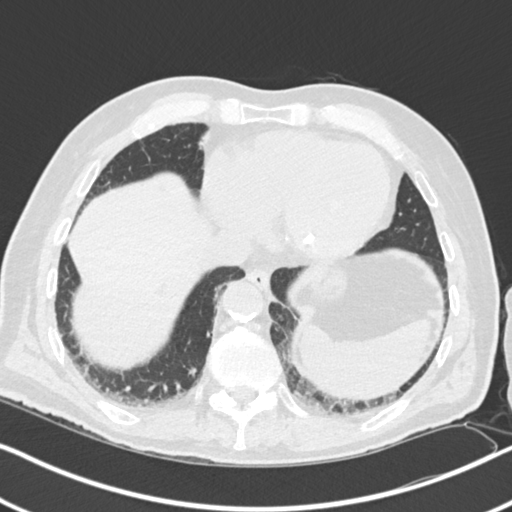
[im 87/189  mediastinal]
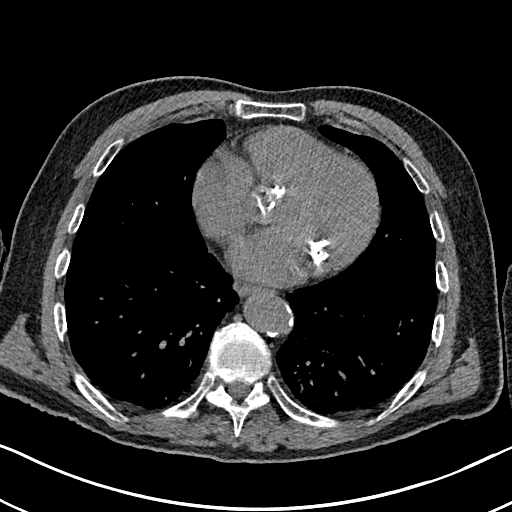
[im 87/189  lung]
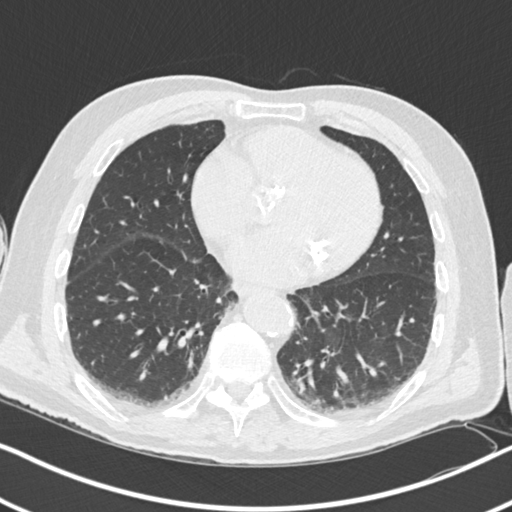
[im 102/189  lung]
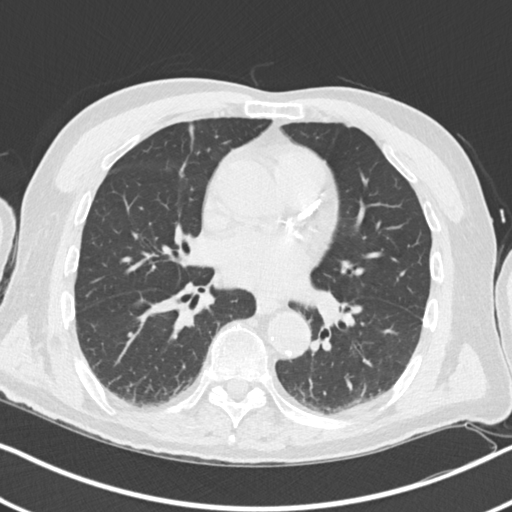
[im 116/189  lung]
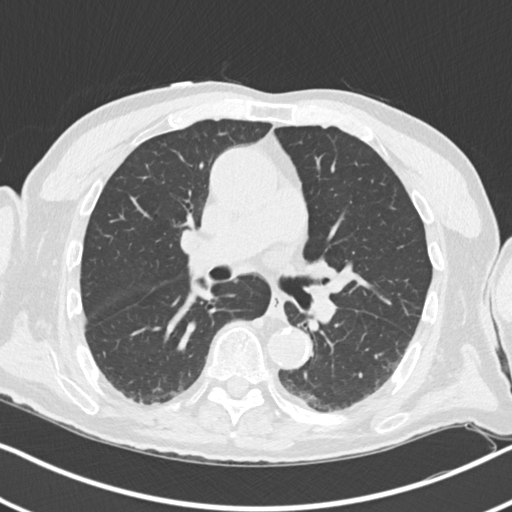
[im 145/189  lung]
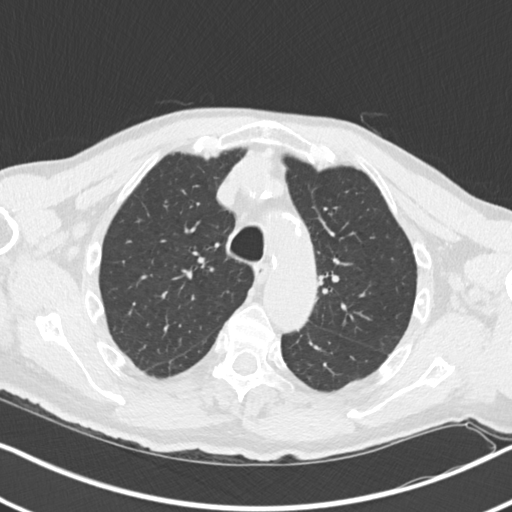
[im 160/189  mediastinal]
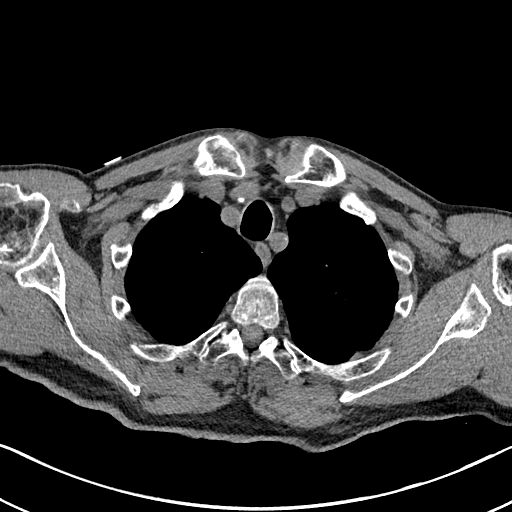
[im 160/189  lung]
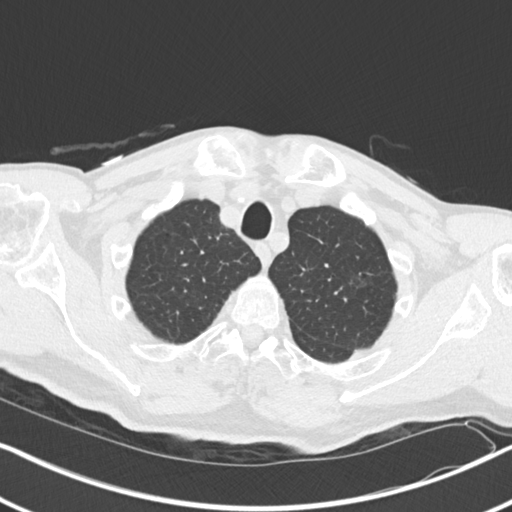
[im 174/189  lung]
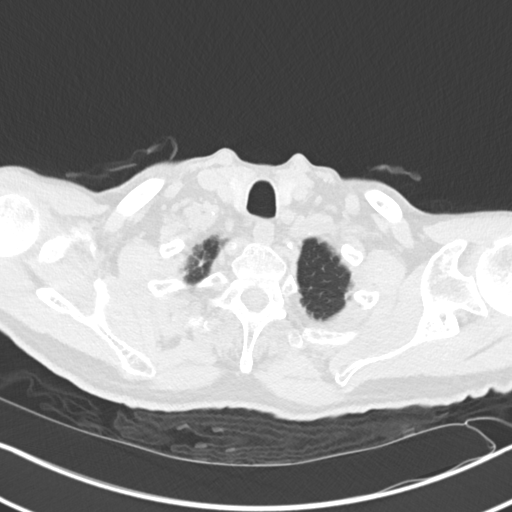

[Series 5: coronal · coronal · 0.71mm/px · 3 of 132 slices shown]
[im 27/132  lung]
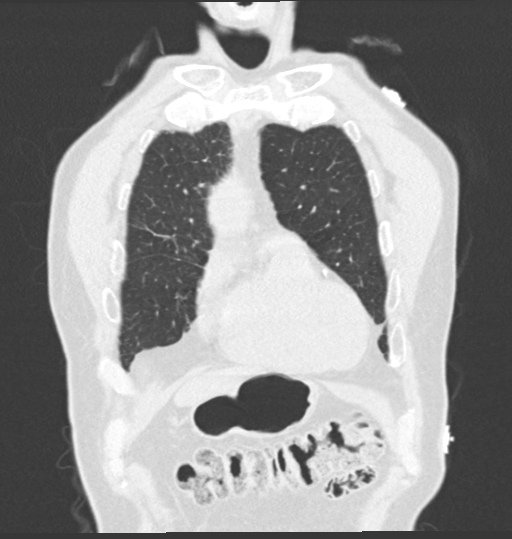
[im 53/132  lung]
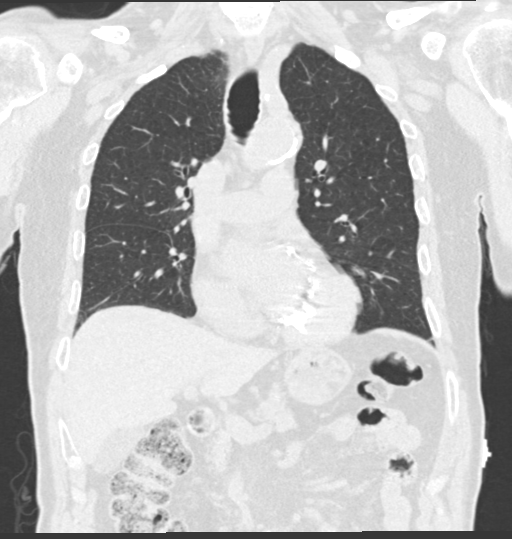
[im 79/132  lung]
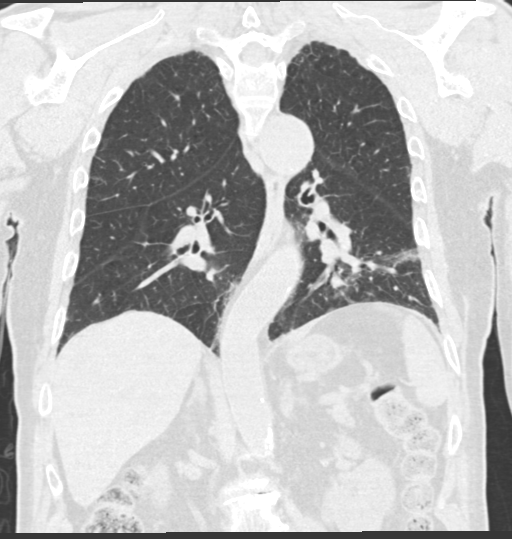

[13 of 36 positions shown; findings below may reference images not displayed]

FINDINGS: Cardiovascular: Calcified aortic atherosclerosis. 4.6 cm transverse
caliber of the ascending thoracic aorta. 4.4 cm in the coronal plane
calcifications of the aortic valve. Calcifications of the mitral
valve. Three-vessel coronary artery disease. No pericardial effusion
or sign of pericardial thickening. Central pulmonary vasculature is
of normal caliber. Limited assessment of cardiovascular structures
given lack of intravenous contrast.

Mediastinum/Nodes: No thoracic inlet, axillary, mediastinal or hilar
adenopathy. Esophagus grossly normal.

Lungs/Pleura: No consolidation or sign of pleural effusion. No
pneumothorax. Mild basilar atelectasis. Subtle ground-glass in the
LEFT chest with bandlike appearance may reflect mild pneumonitis or
component of atelectasis. Airways are patent.

Small RIGHT upper lobe pulmonary nodule (image 67/3) 4 mm.

Pleural and parenchymal scarring at the apices.

Upper Abdomen: Incidental imaging of upper abdominal contents
without acute process related to imaged portions the liver,
gallbladder, pancreas, spleen, adrenal glands, kidneys, stomach,
small and large bowel. There are calcifications within the jejunal
mesentery which are incompletely imaged. Concomitant nodal
enlargement may be present up to 12 mm in the jejunal mesentery in
there is some mild stranding as well. No retroperitoneal adenopathy
in the upper abdomen. Calcified aortic atherosclerosis tracking into
the abdominal aorta.

Musculoskeletal: No destructive bone process. Spinal degenerative
changes. Osteopenia with moderate degenerative changes of the spine.
40% loss of height of the L1 vertebral body.
IMPRESSION: 1. Subtle ground-glass in the LEFT chest with bandlike appearance
may reflect mild pneumonitis or component of atelectasis.
2. 4 mm right solid pulmonary nodule within the upper lobe. If
patient is low risk for malignancy, no routine follow-up imaging is
recommended; if patient is high risk for malignancy, a non-contrast
Chest CT at 12 months is optional. If performed and the nodule is
stable at 12 months, no further follow-up is recommended.
These guidelines do not apply to immunocompromised patients and
patients with cancer. Follow up in patients with significant
comorbidities as clinically warranted. For lung cancer screening,
adhere to Lung-RADS guidelines. Reference: Radiology. 5537;
284(1):228-43.
3. There are calcifications within the jejunal mesentery which are
incompletely imaged. Concomitant nodal enlargement may be present up
to 12 mm in the jejunal mesentery in there is some mild stranding as
well. Findings are nonspecific but can be seen in the setting of
sclerosing mesenteritis, less likely lymphoproliferative disorder
post treatment. Consider comparison with prior imaging or short
interval follow-up with abdominal imaging. This does not appear to
represent an acute finding.
4. Three-vessel coronary artery disease.
5. 4.6 cm transverse caliber, 4.4 cm in the coronal plane of the
ascending thoracic aorta. Could consider the following, recommend
annual imaging followup by CTA or MRA. This recommendation follows
7040 ACCF/AHA/AATS/ACR/ASA/SCA/AYOMIKU/OMAIIMA/TZUCHI/MOOLMAN Guidelines for the
Diagnosis and Management of Patients with Thoracic Aortic Disease.
Circulation. 7040; 121: E266-e369. Aortic aneurysm NOS (H16CZ-6RH.0)
also, given the calcifications of the aortic valve and concomitant
aneurysmal change in the thoracic aorta could consider aortic
stenosis. Correlate clinically.
6. Age-indeterminate compression fracture at L1 is without
surrounding stranding. Correlate with point tenderness in this area.
7. Aortic atherosclerosis.

Aortic Atherosclerosis (H16CZ-HXD.D).

## 2023-11-08 IMAGING — CT CT HIP*R* W/O CM
2 of 6 series · 14 of 46 positions shown, 19 images · non-contrast
Comparison: Hip radiograph 12/12/2020

CLINICAL DATA: Hip pain, stress fracture suspected, neg xray

EXAM:
CT OF THE RIGHT HIP WITHOUT CONTRAST
TECHNIQUE: Multidetector CT imaging of the right hip was performed according to
the standard protocol. Multiplanar CT image reconstructions were
also generated.
RADIATION DOSE REDUCTION: This exam was performed according to the
departmental dose-optimization program which includes automated
exposure control, adjustment of the mA and/or kV according to
patient size and/or use of iterative reconstruction technique.

[Series 8: coronal st · coronal · 0.43mm/px · 3 of 107 slices shown]
[im 22/107  soft-tissue]
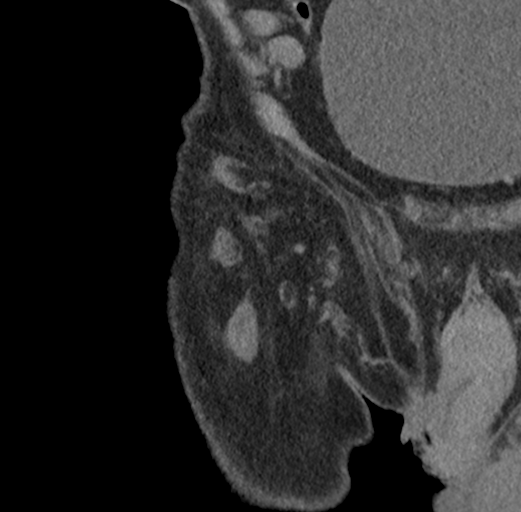
[im 43/107  soft-tissue]
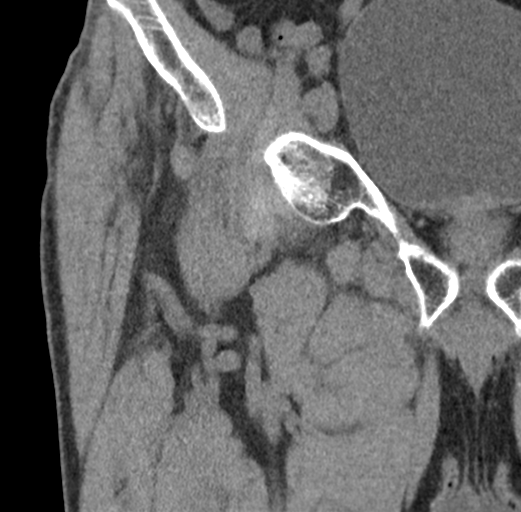
[im 64/107  soft-tissue]
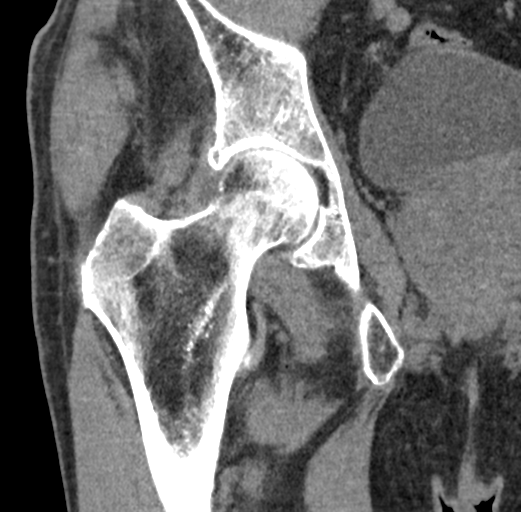

[Series 10: axial (id) · axial · 0.49mm/px · z∈[-716,-528]mm · 11 of 110 slices shown, 16 images]
[im 8/110  soft-tissue]
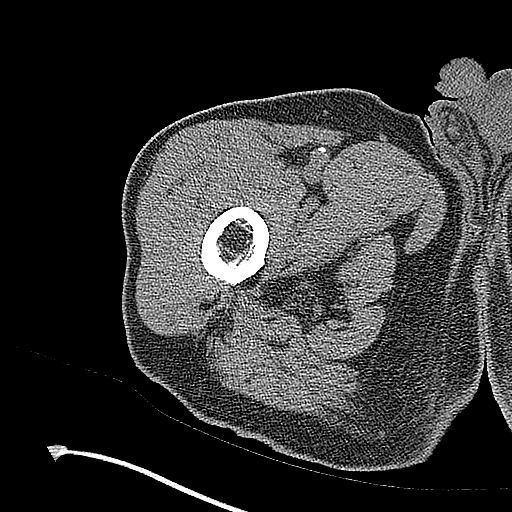
[im 8/110  bone]
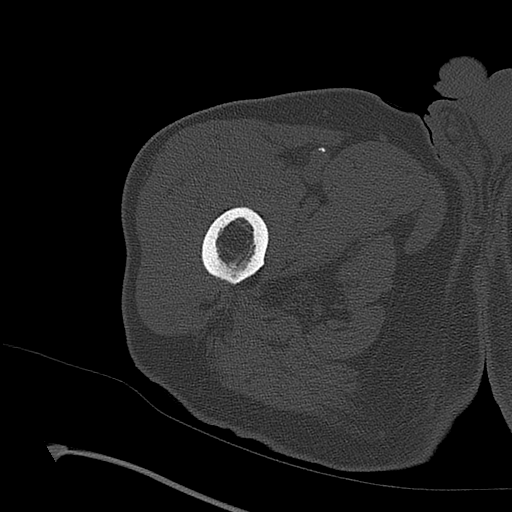
[im 22/110  soft-tissue]
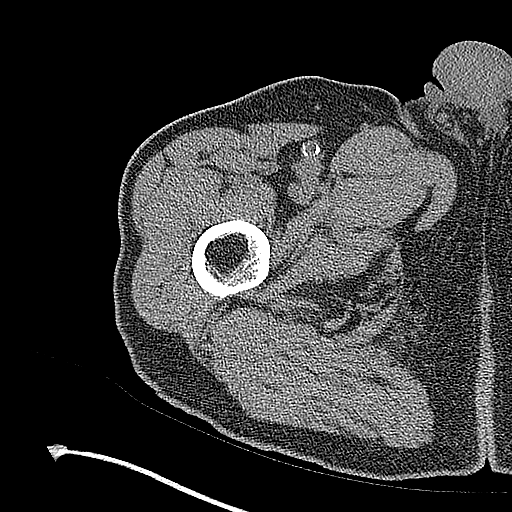
[im 30/110  soft-tissue]
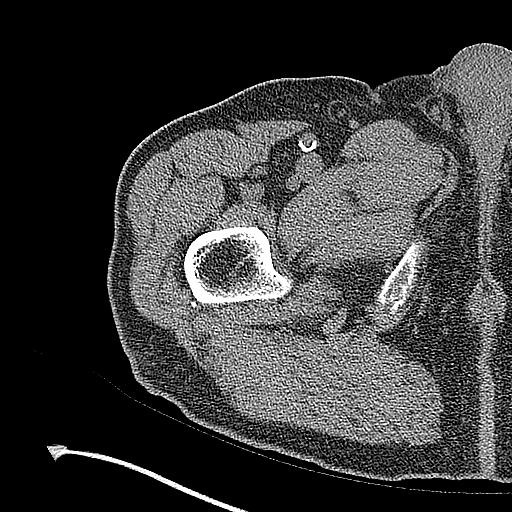
[im 37/110  soft-tissue]
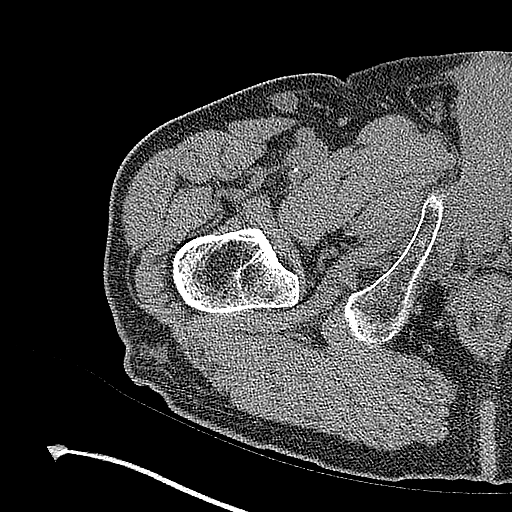
[im 51/110  soft-tissue]
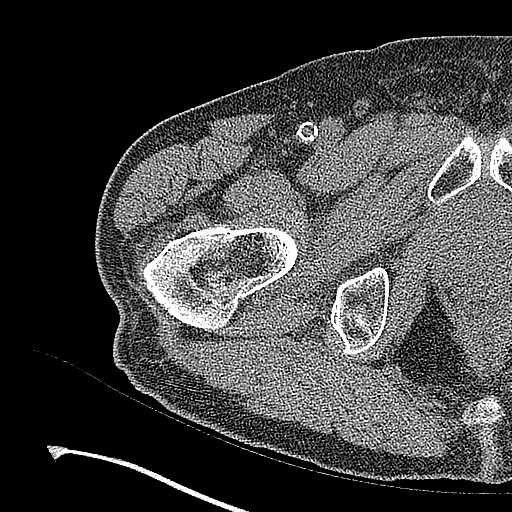
[im 59/110  soft-tissue]
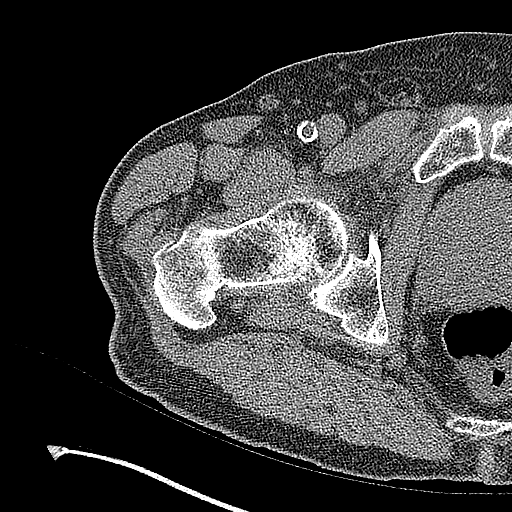
[im 73/110  soft-tissue]
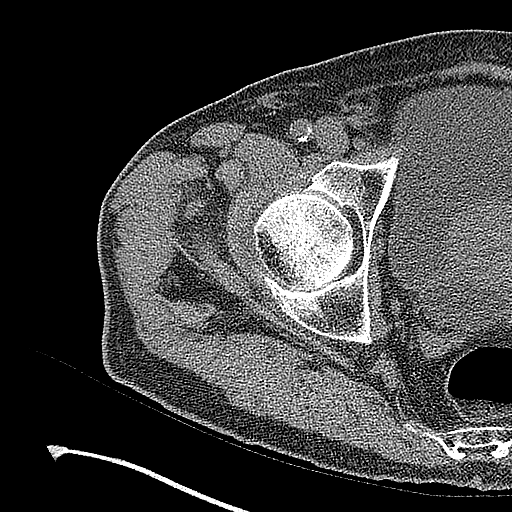
[im 80/110  soft-tissue]
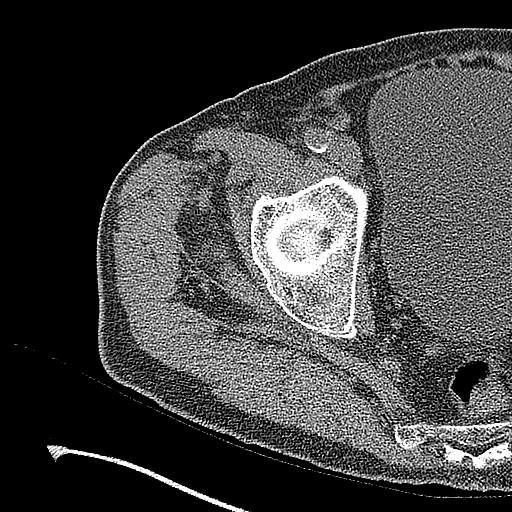
[im 80/110  lung]
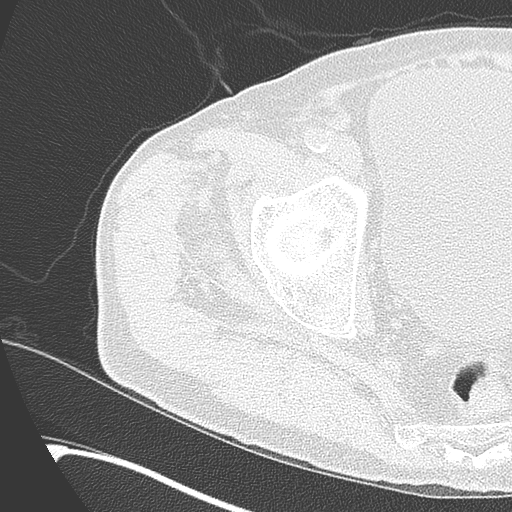
[im 88/110  soft-tissue]
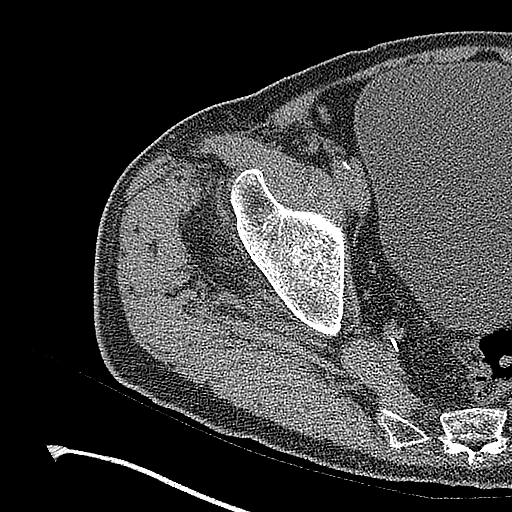
[im 88/110  lung]
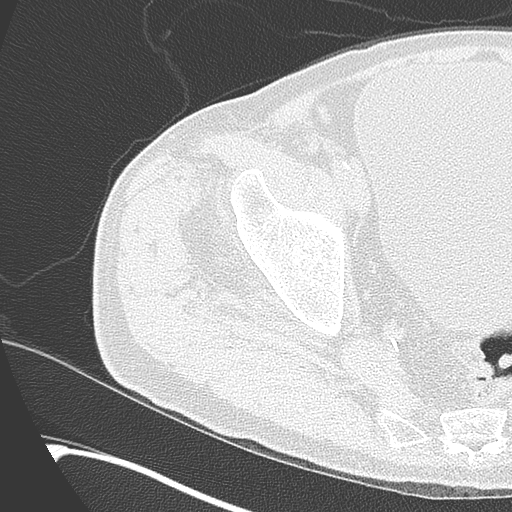
[im 88/110  bone]
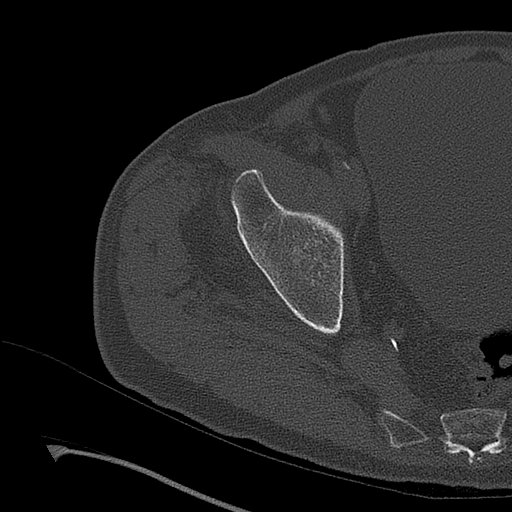
[im 95/110  lung]
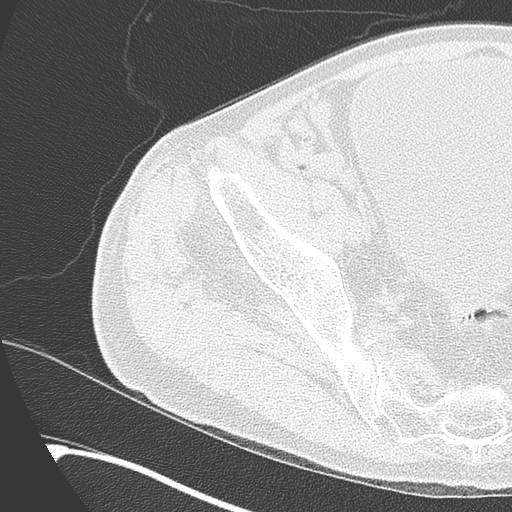
[im 102/110  soft-tissue]
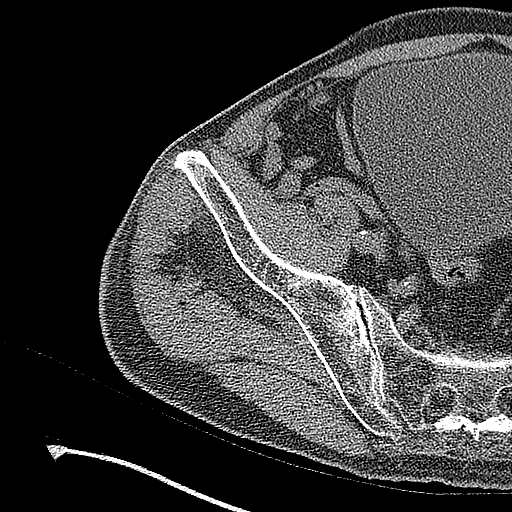
[im 102/110  lung]
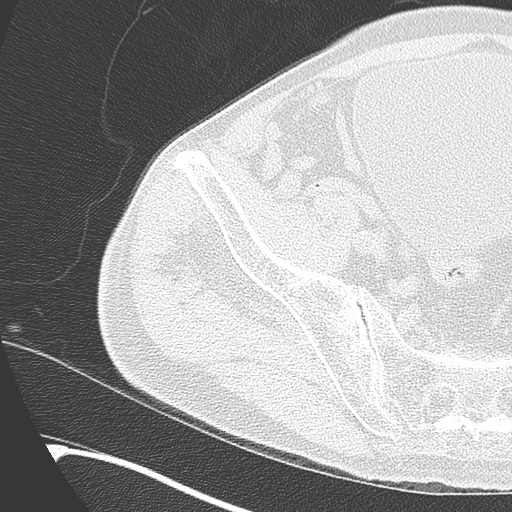

[14 of 46 positions shown; findings below may reference images not displayed]

FINDINGS: Bones/Joint/Cartilage

There is no evidence of acute fracture. Alignment is normal. There
is moderate right hip osteoarthritis.

Ligaments

Suboptimally assessed by CT.

Muscles and Tendons

Right gluteus minimus muscle atrophy. No acute myotendinous
abnormality by CT.

Soft tissues

No focal fluid collection. No lymphadenopathy. Enlarged prostate
gland with distension of the urinary bladder. Small right fat
containing inguinal hernia.
IMPRESSION: No evidence of acute right hip fracture. Moderate right hip
osteoarthritis.

## 2023-12-06 ENCOUNTER — Inpatient Hospital Stay

## 2023-12-06 ENCOUNTER — Emergency Department

## 2023-12-06 ENCOUNTER — Inpatient Hospital Stay
Admission: EM | Admit: 2023-12-06 | Discharge: 2023-12-14 | DRG: 481 | Disposition: A | Discharge status: Skilled Nursing Facility | Source: Skilled Nursing Facility | Attending: Hospitalist | Admitting: Hospitalist

## 2023-12-06 ENCOUNTER — Other Ambulatory Visit: Payer: Self-pay

## 2023-12-06 DIAGNOSIS — Z7401 Bed confinement status: Secondary | ICD-10-CM | POA: Diagnosis not present

## 2023-12-06 DIAGNOSIS — Z7952 Long term (current) use of systemic steroids: Secondary | ICD-10-CM | POA: Diagnosis not present

## 2023-12-06 DIAGNOSIS — Z9181 History of falling: Secondary | ICD-10-CM

## 2023-12-06 DIAGNOSIS — E876 Hypokalemia: Secondary | ICD-10-CM | POA: Diagnosis present

## 2023-12-06 DIAGNOSIS — L120 Bullous pemphigoid: Secondary | ICD-10-CM | POA: Diagnosis present

## 2023-12-06 DIAGNOSIS — W1839XA Other fall on same level, initial encounter: Secondary | ICD-10-CM | POA: Diagnosis present

## 2023-12-06 DIAGNOSIS — S72141A Displaced intertrochanteric fracture of right femur, initial encounter for closed fracture: Secondary | ICD-10-CM | POA: Diagnosis present

## 2023-12-06 DIAGNOSIS — E663 Overweight: Secondary | ICD-10-CM | POA: Diagnosis present

## 2023-12-06 DIAGNOSIS — Z8249 Family history of ischemic heart disease and other diseases of the circulatory system: Secondary | ICD-10-CM

## 2023-12-06 DIAGNOSIS — I350 Nonrheumatic aortic (valve) stenosis: Secondary | ICD-10-CM | POA: Diagnosis not present

## 2023-12-06 DIAGNOSIS — M1611 Unilateral primary osteoarthritis, right hip: Secondary | ICD-10-CM | POA: Diagnosis present

## 2023-12-06 DIAGNOSIS — Z66 Do not resuscitate: Secondary | ICD-10-CM | POA: Diagnosis present

## 2023-12-06 DIAGNOSIS — I69351 Hemiplegia and hemiparesis following cerebral infarction affecting right dominant side: Secondary | ICD-10-CM | POA: Diagnosis not present

## 2023-12-06 DIAGNOSIS — E785 Hyperlipidemia, unspecified: Secondary | ICD-10-CM | POA: Diagnosis present

## 2023-12-06 DIAGNOSIS — I739 Peripheral vascular disease, unspecified: Secondary | ICD-10-CM | POA: Diagnosis present

## 2023-12-06 DIAGNOSIS — I2510 Atherosclerotic heart disease of native coronary artery without angina pectoris: Secondary | ICD-10-CM | POA: Diagnosis present

## 2023-12-06 DIAGNOSIS — Z01810 Encounter for preprocedural cardiovascular examination: Secondary | ICD-10-CM | POA: Diagnosis not present

## 2023-12-06 DIAGNOSIS — Z6823 Body mass index (BMI) 23.0-23.9, adult: Secondary | ICD-10-CM | POA: Diagnosis not present

## 2023-12-06 DIAGNOSIS — R296 Repeated falls: Secondary | ICD-10-CM | POA: Diagnosis present

## 2023-12-06 DIAGNOSIS — D72829 Elevated white blood cell count, unspecified: Secondary | ICD-10-CM | POA: Diagnosis present

## 2023-12-06 DIAGNOSIS — E440 Moderate protein-calorie malnutrition: Secondary | ICD-10-CM | POA: Diagnosis present

## 2023-12-06 DIAGNOSIS — Z9889 Other specified postprocedural states: Secondary | ICD-10-CM

## 2023-12-06 DIAGNOSIS — S7291XA Unspecified fracture of right femur, initial encounter for closed fracture: Secondary | ICD-10-CM | POA: Diagnosis present

## 2023-12-06 DIAGNOSIS — Y92129 Unspecified place in nursing home as the place of occurrence of the external cause: Secondary | ICD-10-CM | POA: Diagnosis not present

## 2023-12-06 DIAGNOSIS — S72001A Fracture of unspecified part of neck of right femur, initial encounter for closed fracture: Secondary | ICD-10-CM

## 2023-12-06 DIAGNOSIS — D692 Other nonthrombocytopenic purpura: Secondary | ICD-10-CM | POA: Diagnosis present

## 2023-12-06 DIAGNOSIS — I10 Essential (primary) hypertension: Secondary | ICD-10-CM | POA: Diagnosis present

## 2023-12-06 DIAGNOSIS — Z7982 Long term (current) use of aspirin: Secondary | ICD-10-CM

## 2023-12-06 DIAGNOSIS — Z96642 Presence of left artificial hip joint: Secondary | ICD-10-CM | POA: Diagnosis present

## 2023-12-06 DIAGNOSIS — Z79899 Other long term (current) drug therapy: Secondary | ICD-10-CM

## 2023-12-06 DIAGNOSIS — Z87891 Personal history of nicotine dependence: Secondary | ICD-10-CM

## 2023-12-06 DIAGNOSIS — W19XXXA Unspecified fall, initial encounter: Principal | ICD-10-CM

## 2023-12-06 LAB — CBC WITH DIFFERENTIAL/PLATELET
Abs Immature Granulocytes: 0.13 10*3/uL — ABNORMAL HIGH (ref 0.00–0.07)
Basophils Absolute: 0.1 10*3/uL (ref 0.0–0.1)
Basophils Relative: 0 %
Eosinophils Absolute: 1.6 10*3/uL — ABNORMAL HIGH (ref 0.0–0.5)
Eosinophils Relative: 10 %
HCT: 26.8 % — ABNORMAL LOW (ref 39.0–52.0)
Hemoglobin: 8.9 g/dL — ABNORMAL LOW (ref 13.0–17.0)
Immature Granulocytes: 1 %
Lymphocytes Relative: 6 %
Lymphs Abs: 0.9 10*3/uL (ref 0.7–4.0)
MCH: 32.5 pg (ref 26.0–34.0)
MCHC: 33.2 g/dL (ref 30.0–36.0)
MCV: 97.8 fL (ref 80.0–100.0)
Monocytes Absolute: 1.1 10*3/uL — ABNORMAL HIGH (ref 0.1–1.0)
Monocytes Relative: 7 %
Neutro Abs: 11.9 10*3/uL — ABNORMAL HIGH (ref 1.7–7.7)
Neutrophils Relative %: 76 %
Platelets: 218 10*3/uL (ref 150–400)
RBC: 2.74 MIL/uL — ABNORMAL LOW (ref 4.22–5.81)
RDW: 13.9 % (ref 11.5–15.5)
WBC: 15.6 10*3/uL — ABNORMAL HIGH (ref 4.0–10.5)
nRBC: 0 % (ref 0.0–0.2)

## 2023-12-06 LAB — BASIC METABOLIC PANEL WITH GFR
Anion gap: 3 — ABNORMAL LOW (ref 5–15)
BUN: 30 mg/dL — ABNORMAL HIGH (ref 8–23)
CO2: 27 mmol/L (ref 22–32)
Calcium: 8.1 mg/dL — ABNORMAL LOW (ref 8.9–10.3)
Chloride: 106 mmol/L (ref 98–111)
Creatinine, Ser: 0.86 mg/dL (ref 0.61–1.24)
GFR, Estimated: >60 mL/min (ref >60)
Glucose, Bld: 132 mg/dL — ABNORMAL HIGH (ref 70–99)
Potassium: 3.3 mmol/L — ABNORMAL LOW (ref 3.5–5.1)
Sodium: 136 mmol/L (ref 135–145)

## 2023-12-06 LAB — MAGNESIUM: Magnesium: 1.7 mg/dL (ref 1.7–2.4)

## 2023-12-06 LAB — CK: Total CK: 39 U/L — ABNORMAL LOW (ref 49–397)

## 2023-12-06 LAB — PROTIME-INR
INR: 1.1 (ref 0.8–1.2)
Prothrombin Time: 14.6 s (ref 11.4–15.2)

## 2023-12-06 MED ORDER — ACETAMINOPHEN 500 MG PO TABS
1000.0000 mg | ORAL_TABLET | Freq: Once | ORAL | Status: AC
  Administered 2023-12-06: 1000 mg via ORAL
  Filled 2023-12-06: qty 2

## 2023-12-06 MED ORDER — HYDROCODONE-ACETAMINOPHEN 5-325 MG PO TABS: 1.0000 | ORAL_TABLET | Freq: Four times a day (QID) | ORAL | Status: DC | PRN

## 2023-12-06 MED ORDER — PREDNISONE 20 MG PO TABS
20.0000 mg | ORAL_TABLET | Freq: Every day | ORAL | Status: DC
  Administered 2023-12-07 – 2023-12-14 (×8): 20 mg via ORAL
  Filled 2023-12-06 (×8): qty 1

## 2023-12-06 MED ORDER — FENTANYL CITRATE PF 50 MCG/ML IJ SOSY: 25.0000 ug | PREFILLED_SYRINGE | INTRAMUSCULAR | Status: DC | PRN

## 2023-12-06 MED ORDER — LACTATED RINGERS IV SOLN: INTRAVENOUS | Status: AC

## 2023-12-06 MED ORDER — ACETAMINOPHEN 325 MG PO TABS: 650.0000 mg | ORAL_TABLET | Freq: Four times a day (QID) | ORAL | Status: DC | PRN

## 2023-12-06 MED ORDER — LISINOPRIL 5 MG PO TABS
5.0000 mg | ORAL_TABLET | Freq: Every day | ORAL | Status: DC
  Administered 2023-12-07 – 2023-12-14 (×8): 5 mg via ORAL
  Filled 2023-12-06 (×8): qty 1

## 2023-12-06 MED ORDER — POLYVINYL ALCOHOL 1.4 % OP SOLN: 1.0000 [drp] | Freq: Three times a day (TID) | OPHTHALMIC | Status: DC | PRN

## 2023-12-06 MED ORDER — HYDRALAZINE HCL 20 MG/ML IJ SOLN: 5.0000 mg | Freq: Four times a day (QID) | INTRAMUSCULAR | Status: AC | PRN

## 2023-12-06 MED ORDER — POTASSIUM CHLORIDE 20 MEQ PO PACK
40.0000 meq | PACK | Freq: Two times a day (BID) | ORAL | Status: DC
  Administered 2023-12-06: 40 meq via ORAL
  Filled 2023-12-06: qty 2

## 2023-12-06 MED ORDER — ONDANSETRON HCL 4 MG/2ML IJ SOLN
4.0000 mg | Freq: Four times a day (QID) | INTRAMUSCULAR | Status: AC | PRN
  Administered 2023-12-07: 4 mg via INTRAVENOUS

## 2023-12-06 MED ORDER — ROSUVASTATIN CALCIUM 10 MG PO TABS
10.0000 mg | ORAL_TABLET | Freq: Every day | ORAL | Status: DC
  Administered 2023-12-06 – 2023-12-13 (×7): 10 mg via ORAL
  Filled 2023-12-06 (×8): qty 1

## 2023-12-06 MED ORDER — FENTANYL CITRATE PF 50 MCG/ML IJ SOSY
50.0000 ug | PREFILLED_SYRINGE | Freq: Once | INTRAMUSCULAR | Status: AC
  Administered 2023-12-06: 50 ug via INTRAVENOUS
  Filled 2023-12-06: qty 1

## 2023-12-06 MED ORDER — SENNOSIDES-DOCUSATE SODIUM 8.6-50 MG PO TABS
1.0000 | ORAL_TABLET | Freq: Every evening | ORAL | Status: DC | PRN
  Administered 2023-12-09: 1 via ORAL
  Filled 2023-12-06 (×2): qty 1

## 2023-12-06 MED ORDER — CYANOCOBALAMIN 500 MCG PO TABS
250.0000 ug | ORAL_TABLET | Freq: Every day | ORAL | Status: DC
  Administered 2023-12-07 – 2023-12-14 (×8): 250 ug via ORAL
  Filled 2023-12-06 (×8): qty 1

## 2023-12-06 MED ORDER — MORPHINE SULFATE (PF) 2 MG/ML IV SOLN
2.0000 mg | Freq: Once | INTRAVENOUS | Status: AC
  Administered 2023-12-06: 2 mg via INTRAVENOUS
  Filled 2023-12-06: qty 1

## 2023-12-06 MED ORDER — MORPHINE SULFATE (PF) 2 MG/ML IV SOLN
2.0000 mg | INTRAVENOUS | Status: AC | PRN
  Administered 2023-12-06 – 2023-12-07 (×4): 2 mg via INTRAVENOUS
  Filled 2023-12-06 (×4): qty 1

## 2023-12-06 NOTE — Assessment & Plan Note (Signed)
 Aspirin  not resumed on admission, a.m. team to resume when benefits outweigh the risk

## 2023-12-06 NOTE — ED Triage Notes (Signed)
 Pt brought in by EMS from [facility] for a unwitnessed fall. Per nursing facility staff, pt was found following a fall at about 6am today. Pt presents to ED with swelling and bruising to RUE and bruising to right hip.

## 2023-12-06 NOTE — Assessment & Plan Note (Signed)
 rosuvastatin  10 mg daily resumed Aspirin  not resumed on admission, a.m. team resume when benefits outweigh the risk

## 2023-12-06 NOTE — Assessment & Plan Note (Signed)
 Symptomatic support Hydrocodone -acetaminophen  5-325 mg p.o. every 6 hours as needed for moderate pain, 1 day ordered Fentanyl  25 mcg IV every 4 hours as needed for severe pain not relieved with IV morphine , 20 hours of coverage ordered LR infusion at 100 mL/h, 1 day ordered

## 2023-12-06 NOTE — Hospital Course (Signed)
 [name] is a 89 year old male with history of hypertension, hyperlipidemia, prior fall status post left hip hemiarthroplasty who presents to the ED for chief concerns of a unwitnessed fall from [employer].  Vitals in the ED showed temperature of 98.7, respiration rate 25, heart rate 77, blood pressure 157/77, SpO2 100% on room air.  Serum sodium is 136, potassium 3.3, chloride 106, bicarb 25, BUN of 30, serum creatinine of 0.86, EGFR greater than 60, nonfasting blood glucose 132, WBC 15.6, hemoglobin 8.9, platelets of 218.  CK is 39.  ED treatment: Acetaminophen  1000 mg p.o. one-time dose, fentanyl  50 mcg IV one-time dose, morphine  2 mg IV one-time dose, potassium chloride  40 mEq p.o. one-time dose.

## 2023-12-06 NOTE — Plan of Care (Signed)
  Problem: Clinical Measurements: Goal: Ability to maintain clinical measurements within normal limits will improve Outcome: Progressing   Problem: Pain Managment: Goal: General experience of comfort will improve and/or be controlled Outcome: Progressing   Problem: Safety: Goal: Ability to remain free from injury will improve Outcome: Progressing   Problem: Skin Integrity: Goal: Risk for impaired skin integrity will decrease Outcome: Progressing

## 2023-12-06 NOTE — Progress Notes (Signed)
 Imaging and labs reviewed Patient with right intertrochanteric fracture Patient will benefit from operative fixation with intramedullary nail of the right femur Will review surgical plan and treatment options with patient and family in the morning and will obtain consent at that time. NPO after midnight for OR tomorrow 12/07/2023 Hold chemical AC after midnight for OR Admit to medicine for optimization for surgery May need PRBC transfusion perioperatively given presenting anemia.  Plan discussed with ED provider  Full consult to follow  Venus Ginsberg MD

## 2023-12-06 NOTE — Assessment & Plan Note (Signed)
 Potassium chloride  40 mEq p.o. one-time dose per EDP Check magnesium  level on admission Will replace further as appropriate

## 2023-12-06 NOTE — Assessment & Plan Note (Addendum)
 Low clinical suspicion for infectious etiology at this time I suspect this is reactive in setting of a femoral fracture However I do agree with a UA, and this can be treated as appropriate Chest x-ray was negative for concerns of pneumonia

## 2023-12-06 NOTE — H&P (Addendum)
 History and Physical   Nicholas Gross:[id number] DOB: 05-13-1928 DOA: 12/06/2023  PCP: Solomon Dupre, DO  Outpatient Specialists: Dr. Wilhelmenia Harada, medical oncology Patient coming from: [facility] via EMS  I have personally briefly reviewed patient's old medical records in [facility] EMR.  Chief Concern: fall, unwitnessed  HPI: [name] is a 89 year old male with history of hypertension, hyperlipidemia, prior fall status post left hip hemiarthroplasty who presents to the ED for chief concerns of a unwitnessed fall from [employer].  Vitals in the ED showed temperature of 98.7, respiration rate 25, heart rate 77, blood pressure 157/77, SpO2 100% on room air.  Serum sodium is 136, potassium 3.3, chloride 106, bicarb 25, BUN of 30, serum creatinine of 0.86, EGFR greater than 60, nonfasting blood glucose 132, WBC 15.6, hemoglobin 8.9, platelets of 218.  CK is 39.  ED treatment: Acetaminophen  1000 mg p.o. one-time dose, fentanyl  50 mcg IV one-time dose, morphine  2 mg IV one-time dose, potassium chloride  40 mEq p.o. one-time dose. ----------------------------------- At bedside, patient was able to tell me his first name, his age of 89.  He was not able to tell me the current calendar year or his current location.  His sister Nicholas Gross was at bedside.  Social history: Unable to complete  ROS: Unable to complete due to acuity of patient presentation  ED Course: Discussed with EDP, patient requiring hospitalization for chief concerns of right femoral fracture.  Assessment/Plan  Principal Problem:   Right femoral fracture (HCC) Active Problems:   Multiple falls   Hypokalemia   Dyslipidemia   Hypertension   CAD (coronary artery disease)   H/O cardiac catheterization   PAD (peripheral artery disease) (HCC)   Senile purpura (HCC)   Osteoarthritis of right hip   Leukocytosis   Assessment and Plan:  * Right femoral fracture (HCC) Symptomatic  support Hydrocodone -acetaminophen  5-325 mg p.o. every 6 hours as needed for moderate pain, 1 day ordered Fentanyl  25 mcg IV every 4 hours as needed for severe pain not relieved with IV morphine , 20 hours of coverage ordered LR infusion at 100 mL/h, 1 day ordered  Multiple falls Fall precautions  Hypokalemia Potassium chloride  40 mEq p.o. one-time dose per EDP Check magnesium  level on admission Will replace further as appropriate  Dyslipidemia Home rosuvastatin  10 mg nightly resumed  Leukocytosis Low clinical suspicion for infectious etiology at this time I suspect this is reactive in setting of a femoral fracture However I do agree with a UA, and this can be treated as appropriate Chest x-ray was negative for concerns of pneumonia  PAD (peripheral artery disease) (HCC) Aspirin  not resumed on admission, a.m. team to resume when benefits outweigh the risk  CAD (coronary artery disease) rosuvastatin  10 mg daily resumed Aspirin  not resumed on admission, a.m. team resume when benefits outweigh the risk  Hypertension Home lisinopril  5 mg daily resumed for 12/07/2023 Hydralazine  5 mg IV every 6 hours as needed for SBP greater 170, 5 days ordered  Chart reviewed.   DVT prophylaxis: TED hose; to initiate pharmacologic DVT prophylaxis when the benefits outweigh the risk Code Status: DNR/DNI, MOST form at bedside Diet: Heart healthy; n.p.o. after midnight Family Communication: Updated Sister Nicholas Gross at bedside Disposition Plan: Pending clinical course Consults called: Orthopedic specialist via EDP Admission status: Telemetry medical, inpatient  Past Medical History:  Diagnosis Date   CAD (coronary artery disease)    History of GI bleed 2008   History of tobacco use    17 pack  years, quit around 1970   Hyperlipidemia    Hypertension    Overweight    Past Surgical History:  Procedure Laterality Date   CAROTID ENDARTERECTOMY Left 2010   CATARACT EXTRACTION     cypher stent   09/12/02   s/p cypher stent mid-LAD   HIP ARTHROPLASTY Left 10/29/2023   Procedure: HEMIARTHROPLASTY (BIPOLAR) HIP, POSTERIOR APPROACH FOR FRACTURE;  Surgeon: Elner Hahn, MD;  Location: [facility] ORS;  Service: Orthopedics;  Laterality: Left;   TENDON REPAIR  1991   finger and arm   Social History:  reports that he quit smoking about 55 years ago. His smoking use included cigarettes. He started smoking about 72 years ago. He has a 17 pack-year smoking history. He has never used smokeless tobacco. He reports that he does not drink alcohol  and does not use drugs.  Allergies  Allergen Reactions   Other     Patient states that he is allergic to something that they place in the IV before they work on you   Simvastatin  Other (See Comments)    Myalgia    Family History  Problem Relation Age of Onset   Heart disease Father        possibly   Cancer Sister        breast   AAA (abdominal aortic aneurysm) Brother    Cancer Sister        lung   Family history: Family history reviewed and not pertinent.  Prior to Admission medications   Medication Sig Start Date End Date Taking? Authorizing Provider  acetaminophen  (TYLENOL ) 325 MG tablet Take 2 tablets (650 mg total) by mouth every 4 (four) hours as needed for moderate pain (pain score 4-6). 10/05/23 10/04/24  Read Camel, MD  aspirin  EC 81 MG tablet Take 81 mg by mouth daily. Swallow whole.    [provider]  carboxymethylcellulose (REFRESH TEARS) 0.5 % SOLN Apply 1 drop to eye 3 (three) times daily as needed. 10/07/23   [provider]  clobetasol  ointment (TEMOVATE ) 0.05 % Apply the medication twice daily to stubborn areas of the skin until smooth. Then stop and re-start as the skin changes come back. 05/28/23   [provider]  cyanocobalamin  (VITAMIN B12) 250 MCG tablet Take by mouth.    [provider]  doxycycline  (VIBRA -TABS) 100 MG tablet Take 100 mg by mouth 2 (two) times daily. 09/17/23 02/14/24   [provider]  enoxaparin  (LOVENOX ) 40 MG/0.4ML injection Inject 0.4 mLs (40 mg total) into the skin daily. 11/02/23   Rojelio Clement, PA-C  furosemide  (LASIX ) 40 MG tablet Take 1 tablet (40 mg total) by mouth daily. 07/06/23   Johnson, Megan P, DO  HYDROcodone -acetaminophen  (NORCO/VICODIN) 5-325 MG tablet Take 1 tablet by mouth every 6 (six) hours as needed for moderate pain (pain score 4-6) or severe pain (pain score 7-10). 11/02/23   Rojelio Clement, PA-C  lisinopril  (ZESTRIL ) 5 MG tablet Take 1 tablet (5 mg total) by mouth daily. 07/06/23   Johnson, Megan P, DO  Multiple Vitamins-Minerals (PRESERVISION AREDS PO) Take 1 tablet by mouth 2 (two) times daily.    [provider]  potassium chloride  (MICRO-K ) 10 MEQ CR capsule Take 10 mEq by mouth daily. 10/22/23   [provider]  predniSONE  (DELTASONE ) 10 MG tablet Take 20 mg by mouth daily with breakfast. 09/17/23 12/16/23  [provider]  rosuvastatin  (CRESTOR ) 10 MG tablet Take 1 tablet (10 mg total) by mouth daily. 07/06/23   Lincoln Renshaw,  Megan P, DO  White Petrolatum (WHITE PETROLEUM JELLY) GEL Apply topically. 04/24/23 04/23/24  [provider]   Physical Exam: Vitals:   12/06/23 1011 12/06/23 1154 12/06/23 1230  BP:  (!) 157/77 130/61  Pulse:  77 74  Resp:  (!) 25 (!) 23  Temp:  98.7 F (37.1 C)   TempSrc:  Axillary   SpO2:  100% 100%  Weight: 70.7 kg    Height: 5' 8 (1.727 m)     Constitutional: appears frail, age-appropriate, weak Eyes: PERRL, lids and conjunctivae normal ENMT: Mucous membranes are moist.  Unable to assess posterior pharynx. Age-appropriate dentition.  Unable to accurately assess hearing Neck: normal, supple, no masses, no thyromegaly Respiratory: clear to auscultation bilaterally, no wheezing, no crackles. Normal respiratory effort. No accessory muscle use.  Cardiovascular: Regular rate and rhythm, no murmurs / rubs / gallops. No extremity edema. 2+ pedal pulses. No  carotid bruits.  Abdomen: no tenderness, no masses palpated, no hepatosplenomegaly. Bowel sounds positive.  Musculoskeletal: no clubbing / cyanosis. No joint deformity upper and lower extremities. Good ROM, no contractures, no atrophy. Normal muscle tone.  Skin: no rashes, lesions, ulcers. No induration Neurologic: Unable to assess sensation, strength.  Psychiatric: Unable to assess judgment, insight, alertness, orientation, mood.   EKG: independently reviewed, showing sinus rhythm with rate of 76, QTc 450  Chest x-ray on Admission: I personally reviewed and I agree with radiologist reading as below.  DG Femur Min 2 Views Right Result Date: 12/06/2023 CLINICAL DATA:  Right hip fracture.  Full femur imaging. EXAM: RIGHT FEMUR 2 VIEWS COMPARISON:  Right hip radiographs 12/06/2023, CT right hip 12/06/2023 FINDINGS: There is again diffuse decreased bone mineralization. Redemonstration of right femoral intertrochanteric acute fracture. There is again mild to moderate varus angulation of the fracture. Subacute healing right superior and inferior pubic ramus fractures again seen. No additional acute fracture is seen within the more distal aspect of the femur. Within the limitation of obliquity of the views, the knee joint appears appropriately aligned. No knee joint effusion is seen. Moderate to high-grade atherosclerotic calcifications. IMPRESSION: 1. Redemonstration of right femoral intertrochanteric acute fracture with mild to moderate varus angulation. 2. Subacute healing right superior and inferior pubic ramus fractures. Electronically Signed   By: Bertina Broccoli M.D.   On: 12/06/2023 13:54   CT Hip Right Wo Contrast Result Date: 12/06/2023 CLINICAL DATA:  Fracture.  Fall. EXAM: CT OF THE RIGHT HIP WITHOUT CONTRAST TECHNIQUE: Multidetector CT imaging of the right hip was performed according to the standard protocol. Multiplanar CT image reconstructions were also generated. RADIATION DOSE REDUCTION: This  exam was performed according to the departmental dose-optimization program which includes automated exposure control, adjustment of the mA and/or kV according to patient size and/or use of iterative reconstruction technique. COMPARISON:  06/17/2022. FINDINGS: Comminuted intertrochanteric fracture of the right femoral neck with varus angulation. Fracture fragments displaced by several mm. Fracture identified of the superior pubic ramus, the inferior acetabular extending to the ischium. Pubic rami fractures demonstrate evidence of callus formation indicating healing. IMPRESSION: 1. Healing fractures of the inferior and superior pubic rami extending to the inferior acetabulum on the right. 2. Acute intertrochanteric fracture of the right femoral neck. Electronically Signed   By: Sydell Eva M.D.   On: 12/06/2023 13:46   DG Chest Port 1 View Result Date: 12/06/2023 CLINICAL DATA:  Fall. EXAM: PORTABLE CHEST 1 VIEW COMPARISON:  AP chest 10/28/2023 FINDINGS: Cardiac silhouette mediastinal contours are unchanged and within normal limits. Moderate  atherosclerotic calcification within the aortic arch. The lungs are clear. No pleural effusion pneumothorax. Mild dextrocurvature of the midthoracic spine with moderate multilevel degenerative disc changes. IMPRESSION: No active disease. Electronically Signed   By: Bertina Broccoli M.D.   On: 12/06/2023 12:12   DG Forearm Right Result Date: 12/06/2023 CLINICAL DATA:  Fall.  Trauma.  Right forearm and hand pain. EXAM: RIGHT HAND - COMPLETE 3+ VIEW; RIGHT FOREARM - 2 VIEW COMPARISON:  None Available. FINDINGS: Right forearm: There is diffuse decreased bone mineralization. No acute fracture is seen within the radius or ulna. Mild medial elbow peripheral degenerative spurring. Soft tissue lucency that may represent a laceration within the distal posterior right upper arm/posterior elbow. There is moderate swelling dorsal and medial to the proximal ulna. -- Right hand: Moderate  thumb carpometacarpal joint space narrowing, subchondral sclerosis, subchondral cystic change, and peripheral osteophytosis. Moderate thumb interphalangeal joint space narrowing and peripheral osteophytosis. Mild third and fourth DIP joint space narrowing. Postsurgical changes of amputation of the index finger to the level of the distal aspect of the middle phalanx. No acute fracture or dislocation. IMPRESSION: 1. No acute fracture is seen within the right forearm or hand. 2. Moderate swelling dorsal and medial to the proximal ulna. 3. Moderate thumb carpometacarpal and interphalangeal osteoarthritis. 4. Postsurgical changes of amputation of the index finger to the level of the distal aspect of the middle phalanx. Electronically Signed   By: Bertina Broccoli M.D.   On: 12/06/2023 12:12   DG Hand Complete Right Result Date: 12/06/2023 CLINICAL DATA:  Fall.  Trauma.  Right forearm and hand pain. EXAM: RIGHT HAND - COMPLETE 3+ VIEW; RIGHT FOREARM - 2 VIEW COMPARISON:  None Available. FINDINGS: Right forearm: There is diffuse decreased bone mineralization. No acute fracture is seen within the radius or ulna. Mild medial elbow peripheral degenerative spurring. Soft tissue lucency that may represent a laceration within the distal posterior right upper arm/posterior elbow. There is moderate swelling dorsal and medial to the proximal ulna. -- Right hand: Moderate thumb carpometacarpal joint space narrowing, subchondral sclerosis, subchondral cystic change, and peripheral osteophytosis. Moderate thumb interphalangeal joint space narrowing and peripheral osteophytosis. Mild third and fourth DIP joint space narrowing. Postsurgical changes of amputation of the index finger to the level of the distal aspect of the middle phalanx. No acute fracture or dislocation. IMPRESSION: 1. No acute fracture is seen within the right forearm or hand. 2. Moderate swelling dorsal and medial to the proximal ulna. 3. Moderate thumb  carpometacarpal and interphalangeal osteoarthritis. 4. Postsurgical changes of amputation of the index finger to the level of the distal aspect of the middle phalanx. Electronically Signed   By: Bertina Broccoli M.D.   On: 12/06/2023 12:12   DG Humerus Right Result Date: 12/06/2023 CLINICAL DATA:  Trauma.  Fall today. EXAM: RIGHT HUMERUS - 2+ VIEW COMPARISON:  None Available. FINDINGS: There is diffuse decreased bone mineralization. Mild acromioclavicular joint space narrowing and peripheral osteophytosis. The acromioclavicular and glenohumeral joint spaces are normally aligned. Within the limitation of low bone mineralization and overlapping bones at the elbow due to projection, no definite acute fracture is seen. No dislocation. IMPRESSION: 1. No acute fracture is seen. 2. Mild acromioclavicular osteoarthritis. Electronically Signed   By: Bertina Broccoli M.D.   On: 12/06/2023 12:07   DG Hip Unilat W or Wo Pelvis 2-3 Views Right Result Date: 12/06/2023 CLINICAL DATA:  Fall.  Trauma.  Bruising and pain to right hip. EXAM: DG HIP (WITH OR WITHOUT PELVIS)  2-3V RIGHT COMPARISON:  Pelvis and right hip radiographs 06/16/2022 and pelvis and left hip radiographs 10/29/2023; CT abdomen and pelvis 09/28/2023 FINDINGS: There is diffuse decreased bone mineralization. Redemonstration of left hip bipolar hemiarthroplasty. The distal stem is not imaged, however no perihardware lucency is seen to indicate hardware failure or loosening. New right femoral intertrochanteric fracture with approximately 8 mm inferior displacement of the distal fracture component with respect to the proximal fracture component. Moderate superior right femoroacetabular joint space narrowing. Mild cortical thickening irregularity of the right superior and inferior pubic rami appears similar to 10/29/2023. These fractures were seen to be acute on 09/28/2023 CT and now demonstrate partial subacute healing. Mild bilateral sacroiliac joint space narrowing  and subchondral sclerosis. Moderate right L4-5 disc space narrowing. IMPRESSION: 1. New right femoral intertrochanteric fracture with approximately 8 mm inferior displacement of the distal fracture component with respect to the proximal fracture component. 2. Partial subacute healing of right superior and inferior pubic rami fractures seen acutely on CT abdomen and pelvis 09/28/2023. 3. Left hip bipolar hemiarthroplasty without evidence of hardware failure or loosening within the visualized portion. Electronically Signed   By: Bertina Broccoli M.D.   On: 12/06/2023 12:05   CT HEAD WO CONTRAST ( ) Result Date: 12/06/2023 CLINICAL DATA:  Trauma. EXAM: CT HEAD WITHOUT CONTRAST CT CERVICAL SPINE WITHOUT CONTRAST TECHNIQUE: Multidetector CT imaging of the head and cervical spine was performed following the standard protocol without intravenous contrast. Multiplanar CT image reconstructions of the cervical spine were also generated. RADIATION DOSE REDUCTION: This exam was performed according to the departmental dose-optimization program which includes automated exposure control, adjustment of the mA and/or kV according to patient size and/or use of iterative reconstruction technique. COMPARISON:  10/29/2023. FINDINGS: CT HEAD FINDINGS Brain: There is periventricular white matter decreased attenuation consistent with small vessel ischemic changes. Ventricles, sulci and cisterns are prominent consistent with age related involutional changes. No acute intracranial hemorrhage, mass effect or shift. No hydrocephalus. Vascular: No hyperdense vessel or unexpected calcification. Skull: Normal. Negative for fracture or focal lesion. Sinuses/Orbits: No acute finding. CT CERVICAL SPINE FINDINGS Alignment: Normal. Skull base and vertebrae: Osseous structures are osteopenic. No acute fracture. No primary bone lesion or focal pathologic process. Soft tissues and spinal canal: No prevertebral fluid or swelling. No visible canal  hematoma. Disc levels: Disc space narrowing with marginal osteophyte formation C4-5 through C6-7. Osteoarthritis at C1-C2. Upper chest: Negative. IMPRESSION: 1. Atrophy and chronic small vessel ischemic changes. No acute intracranial process identified. 2. Osteopenia and degenerative changes. No acute osseous abnormalities. Electronically Signed   By: Sydell Eva M.D.   On: 12/06/2023 10:49   CT Cervical Spine Wo Contrast Result Date: 12/06/2023 CLINICAL DATA:  Trauma. EXAM: CT HEAD WITHOUT CONTRAST CT CERVICAL SPINE WITHOUT CONTRAST TECHNIQUE: Multidetector CT imaging of the head and cervical spine was performed following the standard protocol without intravenous contrast. Multiplanar CT image reconstructions of the cervical spine were also generated. RADIATION DOSE REDUCTION: This exam was performed according to the departmental dose-optimization program which includes automated exposure control, adjustment of the mA and/or kV according to patient size and/or use of iterative reconstruction technique. COMPARISON:  10/29/2023. FINDINGS: CT HEAD FINDINGS Brain: There is periventricular white matter decreased attenuation consistent with small vessel ischemic changes. Ventricles, sulci and cisterns are prominent consistent with age related involutional changes. No acute intracranial hemorrhage, mass effect or shift. No hydrocephalus. Vascular: No hyperdense vessel or unexpected calcification. Skull: Normal. Negative for fracture or focal lesion. Sinuses/Orbits: No acute  finding. CT CERVICAL SPINE FINDINGS Alignment: Normal. Skull base and vertebrae: Osseous structures are osteopenic. No acute fracture. No primary bone lesion or focal pathologic process. Soft tissues and spinal canal: No prevertebral fluid or swelling. No visible canal hematoma. Disc levels: Disc space narrowing with marginal osteophyte formation C4-5 through C6-7. Osteoarthritis at C1-C2. Upper chest: Negative. IMPRESSION: 1. Atrophy and  chronic small vessel ischemic changes. No acute intracranial process identified. 2. Osteopenia and degenerative changes. No acute osseous abnormalities. Electronically Signed   By: Sydell Eva M.D.   On: 12/06/2023 10:49   Labs on Admission: I have personally reviewed following labs  CBC: Recent Labs  Lab 12/06/23 1032  WBC 15.6*  NEUTROABS 11.9*  HGB 8.9*  HCT 26.8*  MCV 97.8  PLT 218   Basic Metabolic Panel: Recent Labs  Lab 12/06/23 1032  NA 136  K 3.3*  CL 106  CO2 27  GLUCOSE 132*  BUN 30*  CREATININE 0.86  CALCIUM  8.1*  MG 1.7   GFR: Estimated Creatinine Clearance: 50.8 mL/min (by C-G formula based on SCr of 0.86 mg/dL).  Coagulation Profile: Recent Labs  Lab 12/06/23 1032  INR 1.1   Cardiac Enzymes: Recent Labs  Lab 12/06/23 1032  CKTOTAL 39*   Urine analysis:    Component Value Date/Time   COLORURINE YELLOW (A) 10/29/2023 0219   APPEARANCEUR CLOUDY (A) 10/29/2023 0219   APPEARANCEUR Clear 02/02/2023 1632   LABSPEC 1.015 10/29/2023 0219   PHURINE 8.0 10/29/2023 0219   GLUCOSEU NEGATIVE 10/29/2023 0219   HGBUR NEGATIVE 10/29/2023 0219   BILIRUBINUR NEGATIVE 10/29/2023 0219   BILIRUBINUR Negative 02/02/2023 1632   KETONESUR NEGATIVE 10/29/2023 0219   PROTEINUR NEGATIVE 10/29/2023 0219   NITRITE POSITIVE (A) 10/29/2023 0219   LEUKOCYTESUR LARGE (A) 10/29/2023 0219   This document was prepared using Dragon Voice Recognition software and may include unintentional dictation errors.  Dr. Reinhold Carbine [facility]  If 7PM-7AM, please contact overnight-coverage provider If 7AM-7PM, please contact day attending provider [email or web address]  12/06/2023, 2:31 PM

## 2023-12-06 NOTE — ED Provider Notes (Signed)
 [facility] Provider Note    Event Date/Time   First MD Initiated Contact with Patient 12/06/23 1007     (approximate)   History   Fall  Pt brought in by EMS from [facility] for a unwitnessed fall. Per nursing facility staff, pt was found following a fall at about 6am today. Pt presents to ED with swelling and bruising to RUE and bruising to right hip.    HPI Nicholas Gross is a 89 y.o. male PMH hypertension, CAD, PAD, bullous pemphigoid on chronic prednisone , CVA with residual right-sided weakness + apahasia/dysarthria, frequent falls, chronic imbalance, recent admission (10/28/2023-11/05/2023 for left femoral neck fracture after fall) now status post left hip bipolar hemiarthroplasty presents for evaluation after an unwitnessed fall - Per EMS, patient had an unwitnessed fall around 6 AM.  They were contacted around 9:30 AM after patient started expressing some element of pain. - On my eval, patient is not able to provide any significant history       Physical Exam   Triage Vital Signs: BP 130/61   Pulse 74   Temp 98.7 F (37.1 C) (Axillary)   Resp (!) 23   Ht 5' 8 (1.727 m)   Wt 70.7 kg   SpO2 100%   BMI 23.70 kg/m    Most recent vital signs: Vitals:   12/06/23 1154 12/06/23 1230  BP: (!) 157/77 130/61  Pulse: 77 74  Resp: (!) 25 (!) 23  Temp: 98.7 F (37.1 C)   SpO2: 100% 100%     General: Awake, no distress.  HEENT: Normocephalic, atraumatic CV:  Good peripheral perfusion. RRR, RP 2+ Resp:  Normal effort. CTAB Abd:  No distention. Nontender to deep palpation throughout Other:  RUE diffusely swollen, appears tender to palpation throughout.  RP 2+.  Compartments compressible. +skin avulsion at elbow. Patient unable to interact with neurologic testing.  Also with small hematoma to right hip, no obvious limb length discrepancy or abnormal rotation, does appear tender to palpation of this area.  No other extremity injuries  appreciated.  Pelvis stable. Neuro:  Alert, interactive responses limited by aphasia/dysarthria.  Not interactive with neurologic testing.   ED Results / Procedures / Treatments   Labs (all labs ordered are listed, but only abnormal results are displayed) Labs Reviewed  BASIC METABOLIC PANEL WITH GFR - Abnormal; Notable for the following components:      Result Value   Potassium 3.3 (*)    Glucose, Bld 132 (*)    BUN 30 (*)    Calcium  8.1 (*)    Anion gap 3 (*)    All other components within normal limits  CBC WITH DIFFERENTIAL/PLATELET - Abnormal; Notable for the following components:   WBC 15.6 (*)    RBC 2.74 (*)    Hemoglobin 8.9 (*)    HCT 26.8 (*)    Neutro Abs 11.9 (*)    Monocytes Absolute 1.1 (*)    Eosinophils Absolute 1.6 (*)    Abs Immature Granulocytes 0.13 (*)    All other components within normal limits  CK - Abnormal; Notable for the following components:   Total CK 39 (*)    All other components within normal limits  PROTIME-INR  MAGNESIUM   URINALYSIS, COMPLETE (UACMP) WITH MICROSCOPIC     EKG  Ecg = sinus rhythm, rate 78, no ST elevation or depression, no significant repolarization abnormality, normal axis, somewhat short PR interval though intervals otherwise normal.  No evidence of ischemia nor arrhythmia  on my interpretation.   RADIOLOGY Radiology interpreted by myself and radiology report reviewed.  Notable for right hip fracture.    PROCEDURES:  Critical Care performed: No  Procedures   MEDICATIONS ORDERED IN ED: Medications  HYDROcodone -acetaminophen  (NORCO/VICODIN) 5-325 MG per tablet 1 tablet (has no administration in time range)  morphine  (PF) 2 MG/ML injection 2 mg (has no administration in time range)  senna-docusate (Senokot-S) tablet 1 tablet (has no administration in time range)  ondansetron  (ZOFRAN ) injection 4 mg (has no administration in time range)  acetaminophen  (TYLENOL ) tablet 650 mg (has no administration in time  range)  fentaNYL  (SUBLIMAZE ) injection 25 mcg (has no administration in time range)  hydrALAZINE  (APRESOLINE ) injection 5 mg (has no administration in time range)  lactated ringers  infusion (has no administration in time range)  lisinopril  (ZESTRIL ) tablet 5 mg (has no administration in time range)  rosuvastatin  (CRESTOR ) tablet 10 mg (has no administration in time range)  predniSONE  (DELTASONE ) tablet 20 mg (has no administration in time range)  cyanocobalamin  (VITAMIN B12) tablet 250 mcg (has no administration in time range)  artificial tears ophthalmic solution 1 drop (has no administration in time range)  fentaNYL  (SUBLIMAZE ) injection 50 mcg (50 mcg Intravenous Given 12/06/23 1027)  morphine  (PF) 2 MG/ML injection 2 mg (2 mg Intravenous Given 12/06/23 1206)  acetaminophen  (TYLENOL ) tablet 1,000 mg (1,000 mg Oral Given 12/06/23 1205)     IMPRESSION / MDM / ASSESSMENT AND PLAN / ED COURSE  I reviewed the triage vital signs and the nursing notes.                              DDX/MDM/AP: Differential diagnosis includes, but is not limited to, concern for right upper extremity fracture, right hip fracture, consider intracranial hemorrhage or skull fracture or C-spine injury given unwitnessed fall despite reassuring physical exam.  Appears patient has been having frequent falls per my chart review with multiple recent fractures in the past few months.-Suspect continuation of frequent falls, had been treated for prior UTI, will screen here.   Plan: - CT head, CT C-spine - X-ray chest, pelvis, right hip, right humerus, right forearm, right hand - N.p.o. - Pain control - Basic labs - EKG - Cardiac monitor - Last Tdap 2023, no indication for repeat at this time  Patient's presentation is most consistent with acute presentation with potential threat to life or bodily function.  The patient is on the cardiac monitor to evaluate for evidence of arrhythmia and/or significant heart rate  changes.  ED course below.  Workup notable for right hip fracture, orthopedics consulted, plan for surgical intervention tomorrow.  Admitted to hospitalist service.  Clinical Course as of 12/06/23 1524  Sun Dec 06, 2023  1059 CTH, CTSpine: IMPRESSION: 1. Atrophy and chronic small vessel ischemic changes. No acute intracranial process identified. 2. Osteopenia and degenerative changes. No acute osseous abnormalities.   [MM]  1100 CBC with leukocytosis to 15.6, stable anemia, otherwise unremarkable [MM]  1153 Patient with persistent pain,, last received fentanyl  about 1 hour ago.  Renal function normal, will treat with morphine  and Tylenol .  Replenish potassium.  Results of x-rays, concern for right hip fracture on my interpretation  Patient's sister is now bedside.  Notes he chronically has a swollen and red right upper extremity and this does not appear different from baseline.  Confirms surgery would be within goals of care as needed [MM]  1223 Xrays reviewed, notable for  right hip intertrochanteric fracture.  No other acute pathology visualized on chest x-ray, x-ray right forearm, humerus, hand. [MM]  1238 Ortho paged [MM]  1247 D/w Dr. Clyda Dark of ortho, imaging reviewed -Request x-ray of full femur as well as CT right hip -Tentative plan for surgical intervention (pin) tomorrow, n.p.o. at midnight -Admit to medicine  Imaging ordered.  Hospitalist consult order placed. [MM]    Clinical Course User Index [MM] Collis Deaner, MD     FINAL CLINICAL IMPRESSION(S) / ED DIAGNOSES   Final diagnoses:  Fall, initial encounter  Closed fracture of right hip, initial encounter [facility])     Rx / DC Orders   ED Discharge Orders     None        Note:  This document was prepared using Dragon voice recognition software and may include unintentional dictation errors.   Collis Deaner, MD 12/06/23 1524

## 2023-12-06 NOTE — Assessment & Plan Note (Signed)
 Fall precautions.

## 2023-12-06 NOTE — Assessment & Plan Note (Addendum)
 Home lisinopril  5 mg daily resumed for 12/07/2023 Hydralazine  5 mg IV every 6 hours as needed for SBP greater 170, 5 days ordered

## 2023-12-06 NOTE — Assessment & Plan Note (Signed)
Home rosuvastatin 10 mg nightly resumed

## 2023-12-07 ENCOUNTER — Inpatient Hospital Stay: Admitting: Anesthesiology

## 2023-12-07 ENCOUNTER — Inpatient Hospital Stay

## 2023-12-07 ENCOUNTER — Other Ambulatory Visit: Payer: Self-pay

## 2023-12-07 ENCOUNTER — Encounter: Payer: Self-pay | Admitting: Internal Medicine

## 2023-12-07 ENCOUNTER — Encounter
Admission: EM | Disposition: A | Discharge status: Skilled Nursing Facility | Payer: Self-pay | Source: Skilled Nursing Facility | Attending: Hospitalist

## 2023-12-07 DIAGNOSIS — I2510 Atherosclerotic heart disease of native coronary artery without angina pectoris: Secondary | ICD-10-CM

## 2023-12-07 DIAGNOSIS — R296 Repeated falls: Secondary | ICD-10-CM | POA: Diagnosis not present

## 2023-12-07 DIAGNOSIS — Z01810 Encounter for preprocedural cardiovascular examination: Secondary | ICD-10-CM

## 2023-12-07 DIAGNOSIS — S72141A Displaced intertrochanteric fracture of right femur, initial encounter for closed fracture: Secondary | ICD-10-CM | POA: Diagnosis not present

## 2023-12-07 DIAGNOSIS — I350 Nonrheumatic aortic (valve) stenosis: Secondary | ICD-10-CM

## 2023-12-07 HISTORY — PX: INTRAMEDULLARY (IM) NAIL INTERTROCHANTERIC: SHX5875

## 2023-12-07 LAB — CBC
HCT: 25 % — ABNORMAL LOW (ref 39.0–52.0)
Hemoglobin: 8.1 g/dL — ABNORMAL LOW (ref 13.0–17.0)
MCH: 32.3 pg (ref 26.0–34.0)
MCHC: 32.4 g/dL (ref 30.0–36.0)
MCV: 99.6 fL (ref 80.0–100.0)
Platelets: 159 10*3/uL (ref 150–400)
RBC: 2.51 MIL/uL — ABNORMAL LOW (ref 4.22–5.81)
RDW: 14 % (ref 11.5–15.5)
WBC: 8.9 10*3/uL (ref 4.0–10.5)
nRBC: 0 % (ref 0.0–0.2)

## 2023-12-07 LAB — CBC WITH DIFFERENTIAL/PLATELET
Abs Immature Granulocytes: 0.09 10*3/uL — ABNORMAL HIGH (ref 0.00–0.07)
Basophils Absolute: 0 10*3/uL (ref 0.0–0.1)
Basophils Relative: 0 %
Eosinophils Absolute: 0.2 10*3/uL (ref 0.0–0.5)
Eosinophils Relative: 2 %
HCT: 27.9 % — ABNORMAL LOW (ref 39.0–52.0)
Hemoglobin: 9.5 g/dL — ABNORMAL LOW (ref 13.0–17.0)
Immature Granulocytes: 1 %
Lymphocytes Relative: 4 %
Lymphs Abs: 0.5 10*3/uL — ABNORMAL LOW (ref 0.7–4.0)
MCH: 31.6 pg (ref 26.0–34.0)
MCHC: 34.1 g/dL (ref 30.0–36.0)
MCV: 92.7 fL (ref 80.0–100.0)
Monocytes Absolute: 0.6 10*3/uL (ref 0.1–1.0)
Monocytes Relative: 6 %
Neutro Abs: 9.2 10*3/uL — ABNORMAL HIGH (ref 1.7–7.7)
Neutrophils Relative %: 87 %
Platelets: 163 10*3/uL (ref 150–400)
RBC: 3.01 MIL/uL — ABNORMAL LOW (ref 4.22–5.81)
RDW: 16.5 % — ABNORMAL HIGH (ref 11.5–15.5)
WBC: 10.6 10*3/uL — ABNORMAL HIGH (ref 4.0–10.5)
nRBC: 0 % (ref 0.0–0.2)

## 2023-12-07 LAB — BASIC METABOLIC PANEL WITH GFR
Anion gap: 8 (ref 5–15)
BUN: 26 mg/dL — ABNORMAL HIGH (ref 8–23)
CO2: 21 mmol/L — ABNORMAL LOW (ref 22–32)
Calcium: 8.2 mg/dL — ABNORMAL LOW (ref 8.9–10.3)
Chloride: 109 mmol/L (ref 98–111)
Creatinine, Ser: 0.7 mg/dL (ref 0.61–1.24)
GFR, Estimated: >60 mL/min (ref >60)
Glucose, Bld: 87 mg/dL (ref 70–99)
Potassium: 3.9 mmol/L (ref 3.5–5.1)
Sodium: 138 mmol/L (ref 135–145)

## 2023-12-07 LAB — MRSA NEXT GEN BY PCR, NASAL: MRSA by PCR Next Gen: NOT DETECTED

## 2023-12-07 LAB — PREPARE RBC (CROSSMATCH)

## 2023-12-07 SURGERY — FIXATION, FRACTURE, INTERTROCHANTERIC, WITH INTRAMEDULLARY ROD
Anesthesia: General | Site: Hip | Laterality: Right

## 2023-12-07 MED ORDER — PHENOL 1.4 % MT LIQD: 1.0000 | OROMUCOSAL | Status: DC | PRN

## 2023-12-07 MED ORDER — DIPHENHYDRAMINE HCL 50 MG/ML IJ SOLN
12.5000 mg | Freq: Three times a day (TID) | INTRAMUSCULAR | Status: DC | PRN
  Administered 2023-12-07 – 2023-12-10 (×3): 12.5 mg via INTRAVENOUS
  Filled 2023-12-07 (×3): qty 1

## 2023-12-07 MED ORDER — 0.9 % SODIUM CHLORIDE (POUR BTL) OPTIME
TOPICAL | Status: DC | PRN
  Administered 2023-12-07: 500 mL

## 2023-12-07 MED ORDER — TRANEXAMIC ACID-NACL 1000-0.7 MG/100ML-% IV SOLN
INTRAVENOUS | Status: AC
Start: 2023-12-07 — End: 2023-12-07
  Filled 2023-12-07: qty 100

## 2023-12-07 MED ORDER — DEXAMETHASONE SODIUM PHOSPHATE 10 MG/ML IJ SOLN
INTRAMUSCULAR | Status: DC | PRN
  Administered 2023-12-07: 5 mg via INTRAVENOUS

## 2023-12-07 MED ORDER — OXYCODONE HCL 5 MG PO TABS: 5.0000 mg | ORAL_TABLET | Freq: Once | ORAL | Status: DC | PRN

## 2023-12-07 MED ORDER — LIDOCAINE HCL (CARDIAC) PF 100 MG/5ML IV SOSY
PREFILLED_SYRINGE | INTRAVENOUS | Status: DC | PRN
Start: 2023-12-07 — End: 2023-12-07
  Administered 2023-12-07: 80 mg via INTRAVENOUS

## 2023-12-07 MED ORDER — TRANEXAMIC ACID-NACL 1000-0.7 MG/100ML-% IV SOLN
1000.0000 mg | INTRAVENOUS | Status: AC
  Administered 2023-12-07: 1000 mg via INTRAVENOUS

## 2023-12-07 MED ORDER — ACETAMINOPHEN 10 MG/ML IV SOLN
INTRAVENOUS | Status: DC | PRN
  Administered 2023-12-07: 1000 mg via INTRAVENOUS

## 2023-12-07 MED ORDER — FENTANYL CITRATE (PF) 100 MCG/2ML IJ SOLN: 25.0000 ug | INTRAMUSCULAR | Status: DC | PRN

## 2023-12-07 MED ORDER — ACETAMINOPHEN 325 MG PO TABS
325.0000 mg | ORAL_TABLET | Freq: Four times a day (QID) | ORAL | Status: DC | PRN
  Administered 2023-12-10 – 2023-12-13 (×4): 650 mg via ORAL
  Filled 2023-12-07 (×5): qty 2

## 2023-12-07 MED ORDER — PHENYLEPHRINE 80 MCG/ML (10ML) SYRINGE FOR IV PUSH (FOR BLOOD PRESSURE SUPPORT)
PREFILLED_SYRINGE | INTRAVENOUS | Status: DC | PRN
  Administered 2023-12-07 (×3): 160 ug via INTRAVENOUS

## 2023-12-07 MED ORDER — FENTANYL CITRATE (PF) 100 MCG/2ML IJ SOLN
INTRAMUSCULAR | Status: AC
Start: 2023-12-07 — End: 2023-12-07
  Filled 2023-12-07: qty 2

## 2023-12-07 MED ORDER — LIDOCAINE HCL (PF) 2 % IJ SOLN
INTRAMUSCULAR | Status: AC
  Filled 2023-12-07: qty 5

## 2023-12-07 MED ORDER — HYDROCODONE-ACETAMINOPHEN 7.5-325 MG PO TABS
1.0000 | ORAL_TABLET | ORAL | Status: DC | PRN
  Administered 2023-12-08 – 2023-12-14 (×5): 1 via ORAL
  Filled 2023-12-07 (×6): qty 1

## 2023-12-07 MED ORDER — ONDANSETRON HCL 4 MG/2ML IJ SOLN
INTRAMUSCULAR | Status: AC
  Filled 2023-12-07: qty 2

## 2023-12-07 MED ORDER — OXYCODONE HCL 5 MG/5ML PO SOLN: 5.0000 mg | Freq: Once | ORAL | Status: DC | PRN

## 2023-12-07 MED ORDER — DOCUSATE SODIUM 100 MG PO CAPS
100.0000 mg | ORAL_CAPSULE | Freq: Two times a day (BID) | ORAL | Status: DC
  Administered 2023-12-08 – 2023-12-14 (×7): 100 mg via ORAL
  Filled 2023-12-07 (×8): qty 1

## 2023-12-07 MED ORDER — ACETAMINOPHEN 500 MG PO TABS
500.0000 mg | ORAL_TABLET | Freq: Four times a day (QID) | ORAL | Status: DC
Start: 2023-12-07 — End: 2023-12-07

## 2023-12-07 MED ORDER — PROPOFOL 10 MG/ML IV BOLUS
INTRAVENOUS | Status: AC
  Filled 2023-12-07: qty 20

## 2023-12-07 MED ORDER — SODIUM CHLORIDE 0.9% IV SOLUTION: Freq: Once | INTRAVENOUS | Status: AC

## 2023-12-07 MED ORDER — BUPIVACAINE-EPINEPHRINE (PF) 0.25% -1:200000 IJ SOLN
INTRAMUSCULAR | Status: AC
Start: 2023-12-07 — End: 2023-12-07
  Filled 2023-12-07: qty 30

## 2023-12-07 MED ORDER — BUPIVACAINE-EPINEPHRINE (PF) 0.25% -1:200000 IJ SOLN
INTRAMUSCULAR | Status: DC | PRN
  Administered 2023-12-07: 20 mL via PERINEURAL

## 2023-12-07 MED ORDER — ACETAMINOPHEN 10 MG/ML IV SOLN
INTRAVENOUS | Status: AC
  Filled 2023-12-07: qty 100

## 2023-12-07 MED ORDER — ROCURONIUM BROMIDE 10 MG/ML (PF) SYRINGE
PREFILLED_SYRINGE | INTRAVENOUS | Status: AC
  Filled 2023-12-07: qty 10

## 2023-12-07 MED ORDER — FENTANYL CITRATE (PF) 100 MCG/2ML IJ SOLN
INTRAMUSCULAR | Status: DC | PRN
  Administered 2023-12-07 (×2): 50 ug via INTRAVENOUS

## 2023-12-07 MED ORDER — CEFAZOLIN SODIUM-DEXTROSE 2-4 GM/100ML-% IV SOLN
2.0000 g | Freq: Four times a day (QID) | INTRAVENOUS | Status: AC
  Administered 2023-12-07 – 2023-12-08 (×2): 2 g via INTRAVENOUS
  Filled 2023-12-07 (×2): qty 100

## 2023-12-07 MED ORDER — PHENYLEPHRINE HCL-NACL 20-0.9 MG/250ML-% IV SOLN
INTRAVENOUS | Status: DC | PRN
  Administered 2023-12-07: 40 ug/min via INTRAVENOUS

## 2023-12-07 MED ORDER — METOCLOPRAMIDE HCL 5 MG PO TABS: 5.0000 mg | ORAL_TABLET | Freq: Three times a day (TID) | ORAL | Status: DC | PRN

## 2023-12-07 MED ORDER — PROPOFOL 10 MG/ML IV BOLUS
INTRAVENOUS | Status: DC | PRN
  Administered 2023-12-07: 100 mg via INTRAVENOUS
  Administered 2023-12-07: 50 mg via INTRAVENOUS

## 2023-12-07 MED ORDER — ROCURONIUM BROMIDE 100 MG/10ML IV SOLN
INTRAVENOUS | Status: DC | PRN
  Administered 2023-12-07: 50 mg via INTRAVENOUS

## 2023-12-07 MED ORDER — SUGAMMADEX SODIUM 200 MG/2ML IV SOLN
INTRAVENOUS | Status: DC | PRN
  Administered 2023-12-07 (×2): 100 mg via INTRAVENOUS

## 2023-12-07 MED ORDER — ADULT MULTIVITAMIN W/MINERALS CH
1.0000 | ORAL_TABLET | Freq: Every day | ORAL | Status: DC
  Administered 2023-12-08: 1
  Filled 2023-12-07: qty 1

## 2023-12-07 MED ORDER — ENSURE PLUS HIGH PROTEIN PO LIQD
237.0000 mL | Freq: Two times a day (BID) | ORAL | Status: DC
  Administered 2023-12-08 – 2023-12-10 (×5): 237 mL via ORAL
  Filled 2023-12-07: qty 237

## 2023-12-07 MED ORDER — CEFAZOLIN SODIUM-DEXTROSE 2-4 GM/100ML-% IV SOLN
INTRAVENOUS | Status: AC
  Filled 2023-12-07: qty 100

## 2023-12-07 MED ORDER — MENTHOL 3 MG MT LOZG: 1.0000 | LOZENGE | OROMUCOSAL | Status: DC | PRN

## 2023-12-07 MED ORDER — CEFAZOLIN SODIUM-DEXTROSE 2-4 GM/100ML-% IV SOLN
2.0000 g | Freq: Once | INTRAVENOUS | Status: AC
  Administered 2023-12-07: 2 g via INTRAVENOUS

## 2023-12-07 MED ORDER — ENOXAPARIN SODIUM 40 MG/0.4ML IJ SOSY
40.0000 mg | PREFILLED_SYRINGE | INTRAMUSCULAR | Status: DC
  Administered 2023-12-08 – 2023-12-14 (×7): 40 mg via SUBCUTANEOUS
  Filled 2023-12-07 (×7): qty 0.4

## 2023-12-07 MED ORDER — METOCLOPRAMIDE HCL 5 MG/ML IJ SOLN: 5.0000 mg | Freq: Three times a day (TID) | INTRAMUSCULAR | Status: DC | PRN

## 2023-12-07 MED ORDER — ACETAMINOPHEN 500 MG PO TABS
500.0000 mg | ORAL_TABLET | Freq: Four times a day (QID) | ORAL | Status: AC
  Administered 2023-12-08 (×2): 500 mg via ORAL
  Filled 2023-12-07 (×3): qty 1

## 2023-12-07 MED ORDER — DEXAMETHASONE SODIUM PHOSPHATE 10 MG/ML IJ SOLN
INTRAMUSCULAR | Status: AC
  Filled 2023-12-07: qty 1

## 2023-12-07 SURGICAL SUPPLY — 42 items
BIT DRILL CALIBRATED 4.2 (BIT) IMPLANT
BIT DRILL CANN 16 HIP (BIT) IMPLANT
BIT DRILL CANN STP 6/9 HIP (BIT) IMPLANT
BIT DRILL TAPERED 10 (BIT) IMPLANT
BLADE HELICAL TFNA 105 (Anchor) IMPLANT
BNDG COHESIVE 6X5 TAN ST LF (GAUZE/BANDAGES/DRESSINGS) ×2 IMPLANT
CHLORAPREP W/TINT 26 (MISCELLANEOUS) ×1 IMPLANT
DERMABOND ADVANCED .7 DNX12 (GAUZE/BANDAGES/DRESSINGS) ×1 IMPLANT
DRAPE C-ARM XRAY 36X54 (DRAPES) ×1 IMPLANT
DRAPE C-ARMOR (DRAPES) IMPLANT
DRAPE SHEET LG 3/4 BI-LAMINATE (DRAPES) ×1 IMPLANT
DRSG OPSITE POSTOP 3X4 (GAUZE/BANDAGES/DRESSINGS) IMPLANT
DRSG OPSITE POSTOP 4X6 (GAUZE/BANDAGES/DRESSINGS) IMPLANT
ELECT CAUTERY BLADE 6.4 (BLADE) IMPLANT
ELECTRODE REM PT RTRN 9FT ADLT (ELECTROSURGICAL) IMPLANT
GLOVE PI ORTHO PRO STRL 7.5 (GLOVE) ×2 IMPLANT
GLOVE SURG SYN 7.5 E (GLOVE) ×1 IMPLANT
GLOVE SURG SYN 7.5 PF PI (GLOVE) ×1 IMPLANT
GOWN SRG XL LVL 3 NONREINFORCE (GOWNS) ×1 IMPLANT
GOWN STRL REUS W/ TWL LRG LVL3 (GOWN DISPOSABLE) ×1 IMPLANT
GUIDEWIRE 3.2X400 (WIRE) IMPLANT
HANDLE YANKAUER SUCT OPEN TIP (MISCELLANEOUS) ×1 IMPLANT
KIT PATIENT CARE HANA TABLE (KITS) ×1 IMPLANT
KIT TURNOVER CYSTO (KITS) ×1 IMPLANT
MANIFOLD NEPTUNE II (INSTRUMENTS) ×1 IMPLANT
MAT ABSORB FLUID 56X50 GRAY (MISCELLANEOUS) ×1 IMPLANT
NAIL TROCH FIX 10X170 130 (Nail) IMPLANT
NDL HYPO 21X1.5 SAFETY (NEEDLE) ×1 IMPLANT
NEEDLE HYPO 21X1.5 SAFETY (NEEDLE) ×1 IMPLANT
NS IRRIG 500ML POUR BTL (IV SOLUTION) ×1 IMPLANT
PACK HIP COMPR (MISCELLANEOUS) ×1 IMPLANT
PAD ARMBOARD POSITIONER FOAM (MISCELLANEOUS) ×1 IMPLANT
PENCIL SMOKE EVACUATOR (MISCELLANEOUS) ×1 IMPLANT
SCREW LOCK STAR 5X36 (Screw) IMPLANT
SLEEVE SCD COMPRESS KNEE MED (STOCKING) ×1 IMPLANT
SUT VIC AB 1 CT1 36 (SUTURE) ×1 IMPLANT
SUT VIC AB 2-0 CT2 27 (SUTURE) ×1 IMPLANT
SUTURE STRATA SPIR 4-0 18 (SUTURE) ×1 IMPLANT
SYR 20ML LL LF (SYRINGE) ×1 IMPLANT
TAPE MICROFOAM 4IN (TAPE) IMPLANT
TRAP FLUID SMOKE EVACUATOR (MISCELLANEOUS) IMPLANT
WATER STERILE IRR 1000ML POUR (IV SOLUTION) ×1 IMPLANT

## 2023-12-07 NOTE — Op Note (Signed)
 Patient Name: Corderius Saraceni  ZOX:[id number]  Pre-Operative Diagnosis: Right hip Intertrochanteric fracture  Post-Operative Diagnosis: (same)  Procedure: Right Hip Intramedullary nail   Components/Implants: Nail:TFNA 10x130 deg x142mm  Lag Blade :  Locking Screw:67mm   Date of Surgery: 12/07/2023  Surgeon: Venus Ginsberg MD  Assistant: Francenia Ingle PA (present and scrubbed throughout the case, critical for assistance with exposure, retraction, instrumentation, and closure), Maura Soulier   Anesthesiologist: Piscitello  Anesthesia: General   EBL: 100cc  IVF: 400cc  Complications: None   Brief history: The patient is a 89 year old male who presented to the [facility] emergency room after a fall and found to have a  right hip intertrochanteric fracture.  The patient was admitted by the medical team and optimized for surgery.  A thorough discussion was had with the patient and family about the risks and benefits of surgical intervention for their hip fracture as definitive treatment.  The patient and family opted to proceed with the operation.  All preoperative films were reviewed and an appropriate surgical plan was made prior to surgery.   Description of procedure: The patient was brought to the operating room where laterality was confirmed by all those present to be the right side.  The patient was administered anesthesia on a stretcher prior to being moved supine on the operating room table. Patient was given an intravenous dose of antibiotics for surgical prophylaxis and TXA.  All bony prominences and extremities were well padded and the patient was securely attached to the table boots, a perineal post was placed and the patient had a safety strap placed.  Surgical site was prepped with alcohol  and chlorhexidine . The surgical site over the hip was and draped in typical sterile fashion with multiple layers of adhesive and nonadhesive drapes.  The incision site  was marked out with a sterile marker under fluoroscopic guidance.    A surgical timeout was then called with participation of all staff in the room the patient was then a confirmed again and laterality confirmed. The hip fracture was reduced through indirect measures with traction and rotation of the leg on the fracture table. After an acceptable reduction was obtained on AP and lateral images the procedure started.  An incision was made just proximal to the greater trochanter through the skin subcutaneous tissues and an incision was made in the glut max fascia.  A guidewire was placed through the greater trochanter at the tip under x-ray guidance into the intertrochanteric region.  The position of this wire was assessed on AP and lateral fluoroscopic images to ensure position.  An opening reamer was used to create access at the tip of the greater trochanter under fluoroscopic guidance.   A size 10x170 nail was advanced through the reamed hole in the proximal femur and seated within the femur to an appropriate depth under fluoroscopic guidance.  The aiming jig was used to mark an incision site for a lag blade to the femoral head which was then incised with a scalpel.  A wire was then advanced through the lateral cortex of the femur into the center of the femoral head on both AP and lateral fluoroscopic imaging stopping at the subchondral bone without penetration of the femoral head.  This wire was then used to measure for a lag screw and a size 105 lag blade was inserted into the femoral head through the lateral cortex of the femur and the nail under fluoroscopic guidance.  The setscrew was then tightened in the proximal  part of the nail and engaged with the lag blade with a small amount of play left so as to allow compression of the fracture and a some compression was also applied through the jig at this time after lowering traction.   The lag screw driver was removed and the aiming jig was switched for the  distal interlocking screw.  A drill was used to drill a hole for the distal interlocking screw under fluoroscopic guidance and a size 36 screw was placed.  The intramedullary nail was assessed on AP and lateral fluoroscopic guidance prior to removal of the aiming jig.  Final fluoroscopic x-rays were then taken after removal of the jig.  The nail was found to be in appropriate position on AP and lateral imaging with appropriate lengths of both the lag screw in the distal locking screw.  The fascia was closed with 0 Vicryl interrupted figure-of-eight sutures.  The subcutaneous tissues were closed with 2-0 Vicryl and the skin closed with 3-0 stratafix and Dermabond.  Sterile dressings were applied to the incisions.   The patient was awoken from anesthesia transferred off of the operating room table onto a hospital bed.  The patient had a good pulse postoperatively in the foot . the patient was then transferred to the PACU in stable condition.

## 2023-12-07 NOTE — Anesthesia Procedure Notes (Signed)
 Procedure Name: Intubation Date/Time: 12/07/2023 1:37 PM  Performed by: Marisue Side, CRNAPre-anesthesia Checklist: Patient identified, Patient being monitored, Timeout performed, Emergency Drugs available and Suction available Patient Re-evaluated:Patient Re-evaluated prior to induction Oxygen Delivery Method: Circle system utilized Preoxygenation: Pre-oxygenation with 100% oxygen Induction Type: IV induction Ventilation: Mask ventilation without difficulty Laryngoscope Size: 3 and McGrath Grade View: Grade I Tube type: Oral Tube size: 7.5 mm Number of attempts: 1 Airway Equipment and Method: Stylet Placement Confirmation: ETT inserted through vocal cords under direct vision, positive ETCO2 and breath sounds checked- equal and bilateral Secured at: 23 cm Tube secured with: Tape Dental Injury: Teeth and Oropharynx as per pre-operative assessment

## 2023-12-07 NOTE — Consult Note (Signed)
 Cardiology Consultation:   Patient ID: Nicholas Gross; [id number]; [birth year]   Admit date: 12/06/2023 Date of Consult: 12/07/2023  Primary Care Provider: Solomon Dupre, DO Primary Cardiologist: Jerelene Monday Primary Electrophysiologist:  None   Patient Profile:   Nicholas Gross is a 89 y.o. male with a hx of CAD status post remote stenting to the mid LAD in 08/2022, carotid artery disease status post left-sided carotid endarterectomy in 2010, frequent falls, HTN, HLD, bullous phigmoid, mesenteric mass with associated dysphagia without suspicious findings on PET scan, and prior tobacco use from 1952-1969 who is being seen today for preoperative cardiac risk stratification at the request of Dr. Antoniette Batty.  History of Present Illness:   Nicholas Gross was previously followed by Dr. Parks Bollman with [facility] cardiology, establishing care with Dr. Gollan in 2022, last being evaluated in the office in 07/2021.  Most recent echo from 11/2021 showed an EF of 60 to 65%, no regional wall motion abnormalities, mild LVH, grade 1 diastolic dysfunction, normal RV systolic function and ventricular cavity size, moderate mitral annular calcification, severe calcification of the aortic valve with mild stenosis with a mean gradient of 8.3 mmHg, and an estimated right atrial pressure of 3 mmHg.  He was admitted in 09/2023 with presumed acute metabolic encephalopathy along with fall complicated by acute appearing right superior and inferior pubic ramus fractures conservatively managed with admission further complicated by acute urinary retention with hematuria and metabolic derangements.  He was admitted in 10/2023 after sustaining a fall leading to a fracture of the left femoral neck and underwent surgical repair without cardiac complication.  He was readmitted on 12/06/2023 after sustaining an unwitnessed fall (family thinks he was attempting to transfer without assistance) leading to right femoral intertrochanteric  fracture with subacute healing of right superior and inferior pubic ramus fractures.  Patient has not complained to family or hospital staff of symptoms of angina or cardiac decompensation.  EKG showed sinus rhythm without acute ischemic changes.  Patient is not currently on telemetry.  At time of cardiology consult the patient denies symptoms of angina.  He reports fix my hip.  Family does report the patient has been complaining of diffuse pruritus this morning indicating he is chronically on low-dose prednisone  at home for this.   Past Medical History:  Diagnosis Date   CAD (coronary artery disease)    History of GI bleed 2008   History of tobacco use    17 pack years, quit around 1970   Hyperlipidemia    Hypertension    Overweight     Past Surgical History:  Procedure Laterality Date   CAROTID ENDARTERECTOMY Left 2010   CATARACT EXTRACTION     cypher stent  09/12/02   s/p cypher stent mid-LAD   HIP ARTHROPLASTY Left 10/29/2023   Procedure: HEMIARTHROPLASTY (BIPOLAR) HIP, POSTERIOR APPROACH FOR FRACTURE;  Surgeon: Elner Hahn, MD;  Location: [facility] ORS;  Service: Orthopedics;  Laterality: Left;   TENDON REPAIR  1991   finger and arm     Home Meds: Prior to Admission medications   Medication Sig Start Date End Date Taking? Authorizing Provider  acetaminophen  (TYLENOL ) 325 MG tablet Take 2 tablets (650 mg total) by mouth every 4 (four) hours as needed for moderate pain (pain score 4-6). 10/05/23 10/04/24 Yes Agbata, Tochukwu, MD  aspirin  EC 81 MG tablet Take 81 mg by mouth 2 (two) times daily. Swallow whole.   Yes [provider]  calcium  carbonate (OS-CAL) 600 MG tablet Take 600 mg  by mouth daily.   Yes [provider]  carboxymethylcellulose (REFRESH TEARS) 0.5 % SOLN Apply 1 drop to eye 3 (three) times daily as needed. 10/07/23  Yes [provider]  clobetasol  ointment (TEMOVATE ) 0.05 % Apply the medication twice daily to stubborn areas of the skin until  smooth. Then stop and re-start as the skin changes come back. 05/28/23  Yes [provider]  cyanocobalamin  (VITAMIN B12) 250 MCG tablet Take by mouth.   Yes [provider]  doxycycline  (VIBRA -TABS) 100 MG tablet Take 100 mg by mouth 2 (two) times daily. 09/17/23 02/14/24 Yes [provider]  HYDROcodone -acetaminophen  (NORCO/VICODIN) 5-325 MG tablet Take 1 tablet by mouth every 6 (six) hours as needed for moderate pain (pain score 4-6) or severe pain (pain score 7-10). 11/02/23  Yes Rojelio Clement, PA-C  lisinopril  (ZESTRIL ) 5 MG tablet Take 1 tablet (5 mg total) by mouth daily. 07/06/23  Yes Johnson, Megan P, DO  Menthol-Zinc  Oxide (CALMOSEPTINE) 0.44-20.6 % OINT Apply 1 Application topically in the morning, at noon, and at bedtime.   Yes [provider]  Multiple Vitamins-Minerals (PRESERVISION AREDS PO) Take 1 tablet by mouth 2 (two) times daily.   Yes [provider]  potassium chloride  (MICRO-K ) 10 MEQ CR capsule Take 20 mEq by mouth daily. 10/22/23  Yes [provider]  predniSONE  (DELTASONE ) 10 MG tablet Take 20 mg by mouth daily with breakfast. 09/17/23 12/16/23 Yes [provider]  rosuvastatin  (CRESTOR ) 10 MG tablet Take 1 tablet (10 mg total) by mouth daily. 07/06/23  Yes Johnson, Megan P, DO  tamsulosin  (FLOMAX ) 0.4 MG CAPS capsule Take 0.4 mg by mouth daily.   Yes [provider]    Inpatient Medications: Scheduled Meds:  sodium chloride    Intravenous Once   cyanocobalamin   250 mcg Oral Daily   lisinopril   5 mg Oral Daily   predniSONE   20 mg Oral Q breakfast   rosuvastatin   10 mg Oral QHS   Continuous Infusions:  lactated ringers  100 mL/hr at 12/07/23 0945   PRN Meds: acetaminophen , artificial tears, diphenhydrAMINE , fentaNYL  (SUBLIMAZE ) injection, hydrALAZINE , HYDROcodone -acetaminophen , morphine  injection, ondansetron  (ZOFRAN ) IV, senna-docusate  Allergies:   Allergies  Allergen Reactions   Other      Patient states that he is allergic to something that they place in the IV before they work on you   Simvastatin  Other (See Comments)    Myalgia     Social History:   Social History   Socioeconomic History   Marital status: Widowed    Spouse name: Not on file   Number of children: Not on file   Years of education: some college in [employer]    Highest education level: High school graduate  Occupational History   Occupation: retired   Tobacco Use   Smoking status: Former    Current packs/day: 0.00    Average packs/day: 1 pack/day for 17.0 years (17.0 ttl pk-yrs)    Types: Cigarettes    Start date: 06/24/1951    Quit date: 06/23/1968    Years since quitting: 55.4   Smokeless tobacco: Never  Vaping Use   Vaping status: Never Used  Substance and Sexual Activity   Alcohol  use: No   Drug use: No   Sexual activity: Not Currently  Other Topics Concern   Not on file  Social History Narrative   Attends church, neighbor take him    Social Drivers of Health   Financial Resource Strain: Low Risk  (04/20/2023)   Received  [facility]   Overall Financial Resource Strain (CARDIA)    Difficulty of Paying Living Expenses: Not very hard  Food Insecurity: No Food Insecurity (12/06/2023)   Hunger Vital Sign    Worried About Running Out of Food in the Last Year: Never true    Ran Out of Food in the Last Year: Never true  Transportation Needs: Patient Unable To Answer (12/06/2023)   PRAPARE - Transportation    Lack of Transportation (Medical): Patient unable to answer    Lack of Transportation (Non-Medical): Patient unable to answer  Physical Activity: Unknown (05/13/2021)   Exercise Vital Sign    Days of Exercise per Week: 2 days    Minutes of Exercise per Session: Not on file  Stress: No Stress Concern Present (05/13/2021)   [employer] of Occupational Health - Occupational Stress Questionnaire    Feeling of Stress : Not at all  Social Connections: Patient Unable To Answer  (12/06/2023)   Social Connection and Isolation Panel    Frequency of Communication with Friends and Family: Patient unable to answer    Frequency of Social Gatherings with Friends and Family: Patient unable to answer    Attends Religious Services: Patient unable to answer    Active Member of Clubs or Organizations: Patient unable to answer    Attends Banker Meetings: Patient unable to answer    Marital Status: Patient unable to answer  Recent Concern: Social Connections - Socially Isolated (10/29/2023)   Social Connection and Isolation Panel    Frequency of Communication with Friends and Family: Once a week    Frequency of Social Gatherings with Friends and Family: More than three times a week    Attends Religious Services: Never    Database administrator or Organizations: No    Attends Banker Meetings: Never    Marital Status: Widowed  Intimate Partner Violence: Patient Unable To Answer (12/06/2023)   Humiliation, Afraid, Rape, and Kick questionnaire    Fear of Current or Ex-Partner: Patient unable to answer    Emotionally Abused: Patient unable to answer    Physically Abused: Patient unable to answer    Sexually Abused: Patient unable to answer     Family History:   Family History  Problem Relation Age of Onset   Heart disease Father        possibly   Cancer Sister        breast   AAA (abdominal aortic aneurysm) Brother    Cancer Sister        lung    ROS:  Review of Systems  Constitutional:  Positive for malaise/fatigue. Negative for chills, diaphoresis, fever and weight loss.  HENT:  Negative for congestion.   Eyes:  Negative for discharge and redness.  Respiratory:  Negative for cough, sputum production, shortness of breath and wheezing.   Cardiovascular:  Negative for chest pain, palpitations, orthopnea, claudication, leg swelling and PND.  Musculoskeletal:  Positive for falls and joint pain.  Skin:  Positive for itching.  Neurological:   Positive for weakness. Negative for dizziness and loss of consciousness.      Physical Exam/Data:   Vitals:   12/06/23 2114 12/07/23 0339 12/07/23 0806 12/07/23 0908  BP: (!) 150/72 (!) 158/78 (!) 171/79 (!) 166/80  Pulse: 67 74 76 80  Resp: 14 16 16    Temp: 98.3 F (36.8 C) 98.3 F (36.8 C) 98.3 F (36.8 C)   TempSrc: Oral     SpO2: 100%  99% 99%   Weight:      Height:        Intake/Output Summary (Last 24 hours) at 12/07/2023 1041 Last data filed at 12/07/2023 0342 Gross per 24 hour  Intake 240 ml  Output 650 ml  Net -410 ml   Filed Weights   12/06/23 1011  Weight: 70.7 kg   Body mass index is 23.7 kg/m.   Physical Exam: General: Elderly and frail appearing, in no acute distress. Head: Normocephalic, atraumatic, sclera non-icteric, no xanthomas, nares without discharge.  Neck: Negative for carotid bruits. JVD not elevated. Lungs: Diminished and coarse breath sounds bilaterally.  Breathing is unlabored. Heart: RRR with S1 S2. I/VI systolic murmur RUSB, no rubs, or gallops appreciated. Abdomen: Soft, non-tender, non-distended with normoactive bowel sounds. No hepatomegaly. No rebound/guarding. No obvious abdominal masses. Msk:  Strength and tone appear normal for age. Extremities: No clubbing or cyanosis. No edema. Distal pedal pulses are 2+ and equal bilaterally. Neuro: Alert. No facial asymmetry. No focal deficit. Moves all extremities spontaneously. Psych:  Intermittently responds to questions appropriately with a normal affect.   EKG:  The EKG was personally reviewed and demonstrates: NSR, 70 bpm, baseline artifact, no acute ST-T changes Telemetry:  Telemetry was personally reviewed and demonstrates: Not on telemetry  Weights: Filed Weights   12/06/23 1011  Weight: 70.7 kg    Relevant CV Studies:  2D echo 12/12/2021: 1. Left ventricular ejection fraction, by estimation, is 60 to 65%. The  left ventricle has normal function. The left ventricle has no  regional  wall motion abnormalities. There is mild left ventricular hypertrophy.  Left ventricular diastolic parameters  are consistent with Grade I diastolic dysfunction (impaired relaxation).   2. Right ventricular systolic function is normal. The right ventricular  size is normal.   3. The mitral valve is normal in structure. No evidence of mitral valve  regurgitation. No evidence of mitral stenosis. Moderate mitral annular  calcification.   4. The aortic valve is normal in structure. There is severe calcifcation  of the aortic valve. Aortic valve regurgitation is not visualized. Mild  aortic valve stenosis. Aortic valve area, by VTI measures 1.05 cm. Aortic  valve mean gradient measures 8.3  mmHg. Aortic valve Vmax measures 1.90 m/s.   5. The inferior vena cava is normal in size with greater than 50%  respiratory variability, suggesting right atrial pressure of 3 mmHg. __________  2D echo 08/08/2021: 1. Left ventricular ejection fraction, by estimation, is 50 to 55%. The  left ventricle has low normal function. The left ventricle has no regional  wall motion abnormalities. There is mild left ventricular hypertrophy.  Left ventricular diastolic  parameters are consistent with Grade I diastolic dysfunction (impaired  relaxation).   2. Right ventricular systolic function is normal. The right ventricular  size is normal. There is normal pulmonary artery systolic pressure. The  estimated right ventricular systolic pressure is 32.9 mmHg.   3. Left atrial size was mild to moderately dilated.   4. The mitral valve is normal in structure. Mild mitral valve  regurgitation. No evidence of mitral stenosis.   5. The aortic valve was not well visualized. There is severe calcifcation  of the aortic valve. Aortic valve regurgitation is mild. Mild aortic valve  stenosis. Aortic valve area, by VTI measures 1.27 cm.   6. There is borderline dilatation of the aortic root, measuring 38 mm.  There  is mild dilatation of the ascending aorta, measuring 42 mm.    Laboratory  Data:  Chemistry Recent Labs  Lab 12/06/23 1032 12/07/23 0207  NA 136 138  K 3.3* 3.9  CL 106 109  CO2 27 21*  GLUCOSE 132* 87  BUN 30* 26*  CREATININE 0.86 0.70  CALCIUM  8.1* 8.2*  GFRNONAA >60 >60  ANIONGAP 3* 8    No results for input(s): PROT, ALBUMIN, AST, ALT, ALKPHOS, BILITOT in the last 168 hours. Hematology Recent Labs  Lab 12/06/23 1032 12/07/23 0207  WBC 15.6* 8.9  RBC 2.74* 2.51*  HGB 8.9* 8.1*  HCT 26.8* 25.0*  MCV 97.8 99.6  MCH 32.5 32.3  MCHC 33.2 32.4  RDW 13.9 14.0  PLT 218 159   Cardiac EnzymesNo results for input(s): TROPONINI in the last 168 hours. No results for input(s): TROPIPOC in the last 168 hours.  BNPNo results for input(s): BNP, PROBNP in the last 168 hours.  DDimer No results for input(s): DDIMER in the last 168 hours.  Radiology/Studies:  US  Venous Img Upper Uni Right(DVT) Result Date: 12/06/2023 IMPRESSION: No evidence of DVT within the right upper extremity. Electronically Signed   By: Wyvonnia Heimlich M.D.   On: 12/06/2023 15:17   DG Femur Min 2 Views Right Result Date: 12/06/2023 IMPRESSION: 1. Redemonstration of right femoral intertrochanteric acute fracture with mild to moderate varus angulation. 2. Subacute healing right superior and inferior pubic ramus fractures. Electronically Signed   By: Bertina Broccoli M.D.   On: 12/06/2023 13:54   CT Hip Right Wo Contrast Result Date: 12/06/2023 IMPRESSION: 1. Healing fractures of the inferior and superior pubic rami extending to the inferior acetabulum on the right. 2. Acute intertrochanteric fracture of the right femoral neck. Electronically Signed   By: Sydell Eva M.D.   On: 12/06/2023 13:46   DG Chest Port 1 View Result Date: 12/06/2023 IMPRESSION: No active disease. Electronically Signed   By: Bertina Broccoli M.D.   On: 12/06/2023 12:12   DG Forearm Right Result Date:  12/06/2023 IMPRESSION: 1. No acute fracture is seen within the right forearm or hand. 2. Moderate swelling dorsal and medial to the proximal ulna. 3. Moderate thumb carpometacarpal and interphalangeal osteoarthritis. 4. Postsurgical changes of amputation of the index finger to the level of the distal aspect of the middle phalanx. Electronically Signed   By: Bertina Broccoli M.D.   On: 12/06/2023 12:12   DG Hand Complete Right Result Date: 12/06/2023 IMPRESSION: 1. No acute fracture is seen within the right forearm or hand. 2. Moderate swelling dorsal and medial to the proximal ulna. 3. Moderate thumb carpometacarpal and interphalangeal osteoarthritis. 4. Postsurgical changes of amputation of the index finger to the level of the distal aspect of the middle phalanx. Electronically Signed   By: Bertina Broccoli M.D.   On: 12/06/2023 12:12   DG Humerus Right Result Date: 12/06/2023 IMPRESSION: 1. No acute fracture is seen. 2. Mild acromioclavicular osteoarthritis. Electronically Signed   By: Bertina Broccoli M.D.   On: 12/06/2023 12:07   DG Hip Unilat W or Wo Pelvis 2-3 Views Right Result Date: 12/06/2023 IMPRESSION: 1. New right femoral intertrochanteric fracture with approximately 8 mm inferior displacement of the distal fracture component with respect to the proximal fracture component. 2. Partial subacute healing of right superior and inferior pubic rami fractures seen acutely on CT abdomen and pelvis 09/28/2023. 3. Left hip bipolar hemiarthroplasty without evidence of hardware failure or loosening within the visualized portion. Electronically Signed   By: Bertina Broccoli M.D.   On: 12/06/2023 12:05   CT HEAD  WO CONTRAST ( ) Result Date: 12/06/2023 IMPRESSION: 1. Atrophy and chronic small vessel ischemic changes. No acute intracranial process identified. 2. Osteopenia and degenerative changes. No acute osseous abnormalities. Electronically Signed   By: Sydell Eva M.D.   On: 12/06/2023 10:49   CT Cervical  Spine Wo Contrast Result Date: 12/06/2023 IMPRESSION: 1. Atrophy and chronic small vessel ischemic changes. No acute intracranial process identified. 2. Osteopenia and degenerative changes. No acute osseous abnormalities. Electronically Signed   By: Sydell Eva M.D.   On: 12/06/2023 10:49    Assessment and Plan:   1.  Preoperative cardiac risk stratification: Patient has recently had multiple falls with family indicating he is following multiple days per week.  In this setting he has suffered acute superior and inferior pubic ramus fractures in 09/2023, left femoral neck fracture in 10/2023 status post surgical repair without cardiac complication, and most recently right femoral intertrochanteric fracture.  Unable to assess METs due to sedentary lifestyle.  Patient is without symptoms of angina or cardiac decompensation and EKG is without significant ischemic changes.  Patient does have a history of mild aortic stenosis noted on echo in 11/2021.  Even if this were progressed patient would not be a candidate for invasive valvular repair.  Would not pursue further cardiac testing at this time as a further intervention will not decrease the patient's perioperative risk.  Patient is at least moderate risk for noncardiac surgery.  2.  CAD involving the native coronary arteries: No symptoms suggestive of angina or cardiac decompensation.  Not currently on aspirin  in the context of persisting and normocytic anemia, resume when/if able.  Remains on rosuvastatin .  No plans for further cardiac testing.  3.  Normocytic anemia: Likely in the setting of recurrent fractures and surgical repair.  Ongoing management per internal medicine.  4.  Pruritus: Longstanding issue.  On PTA prednisone .  Ongoing management per internal medicine.        For questions or updates, please contact [facility] Please consult [email or web address] for contact info under Cardiology/STEMI.   Signed, Varney Gentleman, PA-C [facility] Pager: [phone] 12/07/2023, 10:41 AM

## 2023-12-07 NOTE — Transfer of Care (Signed)
 Immediate Anesthesia Transfer of Care Note  Patient: Nicholas Gross  Procedure(s) Performed: FIXATION, FRACTURE, INTERTROCHANTERIC, WITH INTRAMEDULLARY ROD (Right: Hip)  Patient Location: PACU  Anesthesia Type:General  Level of Consciousness: drowsy  Airway & Oxygen Therapy: Patient Spontanous Breathing and Patient connected to face mask oxygen  Post-op Assessment: Report given to RN and Post -op Vital signs reviewed and stable  Post vital signs: Reviewed and stable  Last Vitals:  Vitals Value Taken Time  BP 108/78 12/07/23 15:03  Temp    Pulse 63 12/07/23 15:04  Resp 12 12/07/23 15:04  SpO2 99 % 12/07/23 15:04  Vitals shown include unfiled device data.  Last Pain:  Vitals:   12/07/23 1219  TempSrc: Temporal  PainSc: 0-No pain         Complications: No notable events documented.

## 2023-12-07 NOTE — Consult Note (Addendum)
 ORTHOPAEDIC CONSULTATION  REQUESTING PHYSICIAN: Montey Apa, DO  Chief Complaint:   Right intertrochanteric fracture  History of Present Illness: Nicholas Gross is a 89 y.o. male with history of hypertension, hyperlipidemia, prior fall status post left hip hemiarthroplasty one month ago presented to the emergency room yesterday after an unwitnessed fall at [facility] where he is recovering postoperatively from his left hip surgery 1 month ago.  Patient is oriented to person and age but nothing else at this time.  Family is at bedside to assist with history.  Patient endorses right hip pain denies pain anywhere else at this time.  Able to follow commands.  Denies chest pain or shortness of breath on exam.  Past Medical History:  Diagnosis Date   CAD (coronary artery disease)    History of GI bleed 2008   History of tobacco use    17 pack years, quit around 1970   Hyperlipidemia    Hypertension    Overweight    Past Surgical History:  Procedure Laterality Date   CAROTID ENDARTERECTOMY Left 2010   CATARACT EXTRACTION     cypher stent  09/12/02   s/p cypher stent mid-LAD   HIP ARTHROPLASTY Left 10/29/2023   Procedure: HEMIARTHROPLASTY (BIPOLAR) HIP, POSTERIOR APPROACH FOR FRACTURE;  Surgeon: Elner Hahn, MD;  Location: [facility] ORS;  Service: Orthopedics;  Laterality: Left;   TENDON REPAIR  1991   finger and arm   Social History   Socioeconomic History   Marital status: Widowed    Spouse name: Not on file   Number of children: Not on file   Years of education: some college in [employer]    Highest education level: High school graduate  Occupational History   Occupation: retired   Tobacco Use   Smoking status: Former    Current packs/day: 0.00    Average packs/day: 1 pack/day for 17.0 years (17.0 ttl pk-yrs)    Types: Cigarettes    Start date: 06/24/1951    Quit date: 06/23/1968    Years since quitting: 55.4    Smokeless tobacco: Never  Vaping Use   Vaping status: Never Used  Substance and Sexual Activity   Alcohol  use: No   Drug use: No   Sexual activity: Not Currently  Other Topics Concern   Not on file  Social History Narrative   Attends church, neighbor take him    Social Drivers of Health   Financial Resource Strain: Low Risk  (04/20/2023)   Received from [facility]   Overall Financial Resource Strain (CARDIA)    Difficulty of Paying Living Expenses: Not very hard  Food Insecurity: No Food Insecurity (12/06/2023)   Hunger Vital Sign    Worried About Running Out of Food in the Last Year: Never true    Ran Out of Food in the Last Year: Never true  Transportation Needs: Patient Unable To Answer (12/06/2023)   PRAPARE - Transportation    Lack of Transportation (Medical): Patient unable to answer    Lack of Transportation (Non-Medical): Patient unable to answer  Physical Activity: Unknown (05/13/2021)   Exercise Vital Sign    Days of Exercise per Week: 2 days    Minutes of Exercise per Session: Not on file  Stress: No Stress Concern Present (05/13/2021)   [employer] of Occupational Health - Occupational Stress Questionnaire    Feeling of Stress : Not at all  Social Connections: Patient Unable To Answer (12/06/2023)   Social Connection and Isolation Panel  Frequency of Communication with Friends and Family: Patient unable to answer    Frequency of Social Gatherings with Friends and Family: Patient unable to answer    Attends Religious Services: Patient unable to answer    Active Member of Clubs or Organizations: Patient unable to answer    Attends Banker Meetings: Patient unable to answer    Marital Status: Patient unable to answer  Recent Concern: Social Connections - Socially Isolated (10/29/2023)   Social Connection and Isolation Panel    Frequency of Communication with Friends and Family: Once a week    Frequency of Social Gatherings with Friends and  Family: More than three times a week    Attends Religious Services: Never    Database administrator or Organizations: No    Attends Banker Meetings: Never    Marital Status: Widowed   Family History  Problem Relation Age of Onset   Heart disease Father        possibly   Cancer Sister        breast   AAA (abdominal aortic aneurysm) Brother    Cancer Sister        lung   Allergies  Allergen Reactions   Other     Patient states that he is allergic to something that they place in the IV before they work on you   Simvastatin  Other (See Comments)    Myalgia    Prior to Admission medications   Medication Sig Start Date End Date Taking? Authorizing Provider  acetaminophen  (TYLENOL ) 325 MG tablet Take 2 tablets (650 mg total) by mouth every 4 (four) hours as needed for moderate pain (pain score 4-6). 10/05/23 10/04/24 Yes Agbata, Tochukwu, MD  aspirin  EC 81 MG tablet Take 81 mg by mouth 2 (two) times daily. Swallow whole.   Yes [provider]  calcium  carbonate (OS-CAL) 600 MG tablet Take 600 mg by mouth daily.   Yes [provider]  carboxymethylcellulose (REFRESH TEARS) 0.5 % SOLN Apply 1 drop to eye 3 (three) times daily as needed. 10/07/23  Yes [provider]  clobetasol  ointment (TEMOVATE ) 0.05 % Apply the medication twice daily to stubborn areas of the skin until smooth. Then stop and re-start as the skin changes come back. 05/28/23  Yes [provider]  cyanocobalamin  (VITAMIN B12) 250 MCG tablet Take by mouth.   Yes [provider]  doxycycline  (VIBRA -TABS) 100 MG tablet Take 100 mg by mouth 2 (two) times daily. 09/17/23 02/14/24 Yes [provider]  HYDROcodone -acetaminophen  (NORCO/VICODIN) 5-325 MG tablet Take 1 tablet by mouth every 6 (six) hours as needed for moderate pain (pain score 4-6) or severe pain (pain score 7-10). 11/02/23  Yes Rojelio Clement, PA-C  lisinopril  (ZESTRIL ) 5 MG tablet Take 1 tablet (5 mg  total) by mouth daily. 07/06/23  Yes Johnson, Megan P, DO  Menthol-Zinc  Oxide (CALMOSEPTINE) 0.44-20.6 % OINT Apply 1 Application topically in the morning, at noon, and at bedtime.   Yes [provider]  Multiple Vitamins-Minerals (PRESERVISION AREDS PO) Take 1 tablet by mouth 2 (two) times daily.   Yes [provider]  potassium chloride  (MICRO-K ) 10 MEQ CR capsule Take 20 mEq by mouth daily. 10/22/23  Yes [provider]  predniSONE  (DELTASONE ) 10 MG tablet Take 20 mg by mouth daily with breakfast. 09/17/23 12/16/23 Yes [provider]  rosuvastatin  (CRESTOR ) 10 MG tablet Take 1 tablet (10 mg total) by mouth daily. 07/06/23  Yes Johnson, Megan P,  DO  tamsulosin  (FLOMAX ) 0.4 MG CAPS capsule Take 0.4 mg by mouth daily.   Yes [provider]   US  Venous Img Upper Uni Right(DVT) Result Date: 12/06/2023 CLINICAL DATA:  Arm swelling and bruising following fall. EXAM: RIGHT UPPER EXTREMITY VENOUS DOPPLER ULTRASOUND TECHNIQUE: Gray-scale sonography with graded compression, as well as color Doppler and duplex ultrasound were performed to evaluate the upper extremity deep venous system from the level of the subclavian vein and including the jugular, axillary, basilic, radial, ulnar and upper cephalic vein. Spectral Doppler was utilized to evaluate flow at rest and with distal augmentation maneuvers. COMPARISON:  None Available. FINDINGS: Contralateral Subclavian Vein: Respiratory phasicity is normal and symmetric with the symptomatic side. No evidence of thrombus. Normal compressibility. Internal Jugular Vein: No evidence of thrombus. Normal compressibility, respiratory phasicity and response to augmentation. Subclavian Vein: No evidence of thrombus. Normal compressibility, respiratory phasicity and response to augmentation. Axillary Vein: No evidence of thrombus. Normal compressibility, respiratory phasicity and response to augmentation. Cephalic Vein: No evidence of  thrombus. Normal compressibility, respiratory phasicity and response to augmentation. Basilic Vein: No evidence of thrombus. Normal compressibility, respiratory phasicity and response to augmentation. Brachial Veins: No evidence of thrombus. Normal compressibility, respiratory phasicity and response to augmentation. Radial Veins: No evidence of thrombus. Normal compressibility, respiratory phasicity and response to augmentation. Ulnar Veins: No evidence of thrombus. Normal compressibility, respiratory phasicity and response to augmentation. Venous Reflux:  None visualized. Other Findings:  Subcutaneous edema is noted in the right forearm. IMPRESSION: No evidence of DVT within the right upper extremity. Electronically Signed   By: Wyvonnia Heimlich M.D.   On: 12/06/2023 15:17   DG Femur Min 2 Views Right Result Date: 12/06/2023 CLINICAL DATA:  Right hip fracture.  Full femur imaging. EXAM: RIGHT FEMUR 2 VIEWS COMPARISON:  Right hip radiographs 12/06/2023, CT right hip 12/06/2023 FINDINGS: There is again diffuse decreased bone mineralization. Redemonstration of right femoral intertrochanteric acute fracture. There is again mild to moderate varus angulation of the fracture. Subacute healing right superior and inferior pubic ramus fractures again seen. No additional acute fracture is seen within the more distal aspect of the femur. Within the limitation of obliquity of the views, the knee joint appears appropriately aligned. No knee joint effusion is seen. Moderate to high-grade atherosclerotic calcifications. IMPRESSION: 1. Redemonstration of right femoral intertrochanteric acute fracture with mild to moderate varus angulation. 2. Subacute healing right superior and inferior pubic ramus fractures. Electronically Signed   By: Bertina Broccoli M.D.   On: 12/06/2023 13:54   CT Hip Right Wo Contrast Result Date: 12/06/2023 CLINICAL DATA:  Fracture.  Fall. EXAM: CT OF THE RIGHT HIP WITHOUT CONTRAST TECHNIQUE: Multidetector  CT imaging of the right hip was performed according to the standard protocol. Multiplanar CT image reconstructions were also generated. RADIATION DOSE REDUCTION: This exam was performed according to the departmental dose-optimization program which includes automated exposure control, adjustment of the mA and/or kV according to patient size and/or use of iterative reconstruction technique. COMPARISON:  06/17/2022. FINDINGS: Comminuted intertrochanteric fracture of the right femoral neck with varus angulation. Fracture fragments displaced by several mm. Fracture identified of the superior pubic ramus, the inferior acetabular extending to the ischium. Pubic rami fractures demonstrate evidence of callus formation indicating healing. IMPRESSION: 1. Healing fractures of the inferior and superior pubic rami extending to the inferior acetabulum on the right. 2. Acute intertrochanteric fracture of the right femoral neck. Electronically Signed   By: Sydell Eva M.D.   On: 12/06/2023  13:46   DG Chest Port 1 View Result Date: 12/06/2023 CLINICAL DATA:  Fall. EXAM: PORTABLE CHEST 1 VIEW COMPARISON:  AP chest 10/28/2023 FINDINGS: Cardiac silhouette mediastinal contours are unchanged and within normal limits. Moderate atherosclerotic calcification within the aortic arch. The lungs are clear. No pleural effusion pneumothorax. Mild dextrocurvature of the midthoracic spine with moderate multilevel degenerative disc changes. IMPRESSION: No active disease. Electronically Signed   By: Bertina Broccoli M.D.   On: 12/06/2023 12:12   DG Forearm Right Result Date: 12/06/2023 CLINICAL DATA:  Fall.  Trauma.  Right forearm and hand pain. EXAM: RIGHT HAND - COMPLETE 3+ VIEW; RIGHT FOREARM - 2 VIEW COMPARISON:  None Available. FINDINGS: Right forearm: There is diffuse decreased bone mineralization. No acute fracture is seen within the radius or ulna. Mild medial elbow peripheral degenerative spurring. Soft tissue lucency that may  represent a laceration within the distal posterior right upper arm/posterior elbow. There is moderate swelling dorsal and medial to the proximal ulna. -- Right hand: Moderate thumb carpometacarpal joint space narrowing, subchondral sclerosis, subchondral cystic change, and peripheral osteophytosis. Moderate thumb interphalangeal joint space narrowing and peripheral osteophytosis. Mild third and fourth DIP joint space narrowing. Postsurgical changes of amputation of the index finger to the level of the distal aspect of the middle phalanx. No acute fracture or dislocation. IMPRESSION: 1. No acute fracture is seen within the right forearm or hand. 2. Moderate swelling dorsal and medial to the proximal ulna. 3. Moderate thumb carpometacarpal and interphalangeal osteoarthritis. 4. Postsurgical changes of amputation of the index finger to the level of the distal aspect of the middle phalanx. Electronically Signed   By: Bertina Broccoli M.D.   On: 12/06/2023 12:12   DG Hand Complete Right Result Date: 12/06/2023 CLINICAL DATA:  Fall.  Trauma.  Right forearm and hand pain. EXAM: RIGHT HAND - COMPLETE 3+ VIEW; RIGHT FOREARM - 2 VIEW COMPARISON:  None Available. FINDINGS: Right forearm: There is diffuse decreased bone mineralization. No acute fracture is seen within the radius or ulna. Mild medial elbow peripheral degenerative spurring. Soft tissue lucency that may represent a laceration within the distal posterior right upper arm/posterior elbow. There is moderate swelling dorsal and medial to the proximal ulna. -- Right hand: Moderate thumb carpometacarpal joint space narrowing, subchondral sclerosis, subchondral cystic change, and peripheral osteophytosis. Moderate thumb interphalangeal joint space narrowing and peripheral osteophytosis. Mild third and fourth DIP joint space narrowing. Postsurgical changes of amputation of the index finger to the level of the distal aspect of the middle phalanx. No acute fracture or  dislocation. IMPRESSION: 1. No acute fracture is seen within the right forearm or hand. 2. Moderate swelling dorsal and medial to the proximal ulna. 3. Moderate thumb carpometacarpal and interphalangeal osteoarthritis. 4. Postsurgical changes of amputation of the index finger to the level of the distal aspect of the middle phalanx. Electronically Signed   By: Bertina Broccoli M.D.   On: 12/06/2023 12:12   DG Humerus Right Result Date: 12/06/2023 CLINICAL DATA:  Trauma.  Fall today. EXAM: RIGHT HUMERUS - 2+ VIEW COMPARISON:  None Available. FINDINGS: There is diffuse decreased bone mineralization. Mild acromioclavicular joint space narrowing and peripheral osteophytosis. The acromioclavicular and glenohumeral joint spaces are normally aligned. Within the limitation of low bone mineralization and overlapping bones at the elbow due to projection, no definite acute fracture is seen. No dislocation. IMPRESSION: 1. No acute fracture is seen. 2. Mild acromioclavicular osteoarthritis. Electronically Signed   By: Bertina Broccoli M.D.   On:  12/06/2023 12:07   DG Hip Unilat W or Wo Pelvis 2-3 Views Right Result Date: 12/06/2023 CLINICAL DATA:  Fall.  Trauma.  Bruising and pain to right hip. EXAM: DG HIP (WITH OR WITHOUT PELVIS) 2-3V RIGHT COMPARISON:  Pelvis and right hip radiographs 06/16/2022 and pelvis and left hip radiographs 10/29/2023; CT abdomen and pelvis 09/28/2023 FINDINGS: There is diffuse decreased bone mineralization. Redemonstration of left hip bipolar hemiarthroplasty. The distal stem is not imaged, however no perihardware lucency is seen to indicate hardware failure or loosening. New right femoral intertrochanteric fracture with approximately 8 mm inferior displacement of the distal fracture component with respect to the proximal fracture component. Moderate superior right femoroacetabular joint space narrowing. Mild cortical thickening irregularity of the right superior and inferior pubic rami appears similar  to 10/29/2023. These fractures were seen to be acute on 09/28/2023 CT and now demonstrate partial subacute healing. Mild bilateral sacroiliac joint space narrowing and subchondral sclerosis. Moderate right L4-5 disc space narrowing. IMPRESSION: 1. New right femoral intertrochanteric fracture with approximately 8 mm inferior displacement of the distal fracture component with respect to the proximal fracture component. 2. Partial subacute healing of right superior and inferior pubic rami fractures seen acutely on CT abdomen and pelvis 09/28/2023. 3. Left hip bipolar hemiarthroplasty without evidence of hardware failure or loosening within the visualized portion. Electronically Signed   By: Bertina Broccoli M.D.   On: 12/06/2023 12:05   CT HEAD WO CONTRAST ( ) Result Date: 12/06/2023 CLINICAL DATA:  Trauma. EXAM: CT HEAD WITHOUT CONTRAST CT CERVICAL SPINE WITHOUT CONTRAST TECHNIQUE: Multidetector CT imaging of the head and cervical spine was performed following the standard protocol without intravenous contrast. Multiplanar CT image reconstructions of the cervical spine were also generated. RADIATION DOSE REDUCTION: This exam was performed according to the departmental dose-optimization program which includes automated exposure control, adjustment of the mA and/or kV according to patient size and/or use of iterative reconstruction technique. COMPARISON:  10/29/2023. FINDINGS: CT HEAD FINDINGS Brain: There is periventricular white matter decreased attenuation consistent with small vessel ischemic changes. Ventricles, sulci and cisterns are prominent consistent with age related involutional changes. No acute intracranial hemorrhage, mass effect or shift. No hydrocephalus. Vascular: No hyperdense vessel or unexpected calcification. Skull: Normal. Negative for fracture or focal lesion. Sinuses/Orbits: No acute finding. CT CERVICAL SPINE FINDINGS Alignment: Normal. Skull base and vertebrae: Osseous structures are  osteopenic. No acute fracture. No primary bone lesion or focal pathologic process. Soft tissues and spinal canal: No prevertebral fluid or swelling. No visible canal hematoma. Disc levels: Disc space narrowing with marginal osteophyte formation C4-5 through C6-7. Osteoarthritis at C1-C2. Upper chest: Negative. IMPRESSION: 1. Atrophy and chronic small vessel ischemic changes. No acute intracranial process identified. 2. Osteopenia and degenerative changes. No acute osseous abnormalities. Electronically Signed   By: Sydell Eva M.D.   On: 12/06/2023 10:49   CT Cervical Spine Wo Contrast Result Date: 12/06/2023 CLINICAL DATA:  Trauma. EXAM: CT HEAD WITHOUT CONTRAST CT CERVICAL SPINE WITHOUT CONTRAST TECHNIQUE: Multidetector CT imaging of the head and cervical spine was performed following the standard protocol without intravenous contrast. Multiplanar CT image reconstructions of the cervical spine were also generated. RADIATION DOSE REDUCTION: This exam was performed according to the departmental dose-optimization program which includes automated exposure control, adjustment of the mA and/or kV according to patient size and/or use of iterative reconstruction technique. COMPARISON:  10/29/2023. FINDINGS: CT HEAD FINDINGS Brain: There is periventricular white matter decreased attenuation consistent with small vessel ischemic changes. Ventricles, sulci and  cisterns are prominent consistent with age related involutional changes. No acute intracranial hemorrhage, mass effect or shift. No hydrocephalus. Vascular: No hyperdense vessel or unexpected calcification. Skull: Normal. Negative for fracture or focal lesion. Sinuses/Orbits: No acute finding. CT CERVICAL SPINE FINDINGS Alignment: Normal. Skull base and vertebrae: Osseous structures are osteopenic. No acute fracture. No primary bone lesion or focal pathologic process. Soft tissues and spinal canal: No prevertebral fluid or swelling. No visible canal hematoma.  Disc levels: Disc space narrowing with marginal osteophyte formation C4-5 through C6-7. Osteoarthritis at C1-C2. Upper chest: Negative. IMPRESSION: 1. Atrophy and chronic small vessel ischemic changes. No acute intracranial process identified. 2. Osteopenia and degenerative changes. No acute osseous abnormalities. Electronically Signed   By: Sydell Eva M.D.   On: 12/06/2023 10:49    Positive ROS: All other systems have been reviewed and were otherwise negative with the exception of those mentioned in the HPI and as above.  Physical Exam: General:  Alert, no acute distress Psychiatric:  Patient is oriented to self otherwise not oriented Cardiovascular:  No pedal edema Respiratory:  No wheezing, non-labored breathing GI:  Abdomen is soft and non-tender Skin:  No lesions in the area of chief complaint Neurologic:  Sensation intact distally Lymphatic:  No axillary or cervical lymphadenopathy  Orthopedic Exam:  Right lower extremity Shortened and externally rotated Skin intact over the hip with some ecchymosis Compartments all soft Tender to palpation over the hip no tenderness over the distal femur, knee, tibia, ankle foot or toes Able to dorsiflex and plantarflex the foot and toes intact dorsalis pedis pulse with brisk capillary refill Neurovascular intact  Secondary survey No tenderness to palpation over other bony prominences in the lower extremities or bilateral upper extremities Well-healed incision over the left posterior hip. No pain with logroll or simulated axial loading of the left lower extremity All compartments soft No tenderness to palpation over the cervical or thoracic spine, no bony step-off Motor grossly intact throughout, no focal deficits Sensation grossly intact throughout, no focal deficits Good distal pulses and capillary refill on all extremities   Imaging:  X-rays and CT scan reviewed of the right hip images and report reviewed myself.  There is a right  intertrochanteric basicervical femoral fracture with comminution and displacement.  No subcapital or superior femoral neck involvement.  No distal femoral involvement.  Agree with radiologist interpretation.  Assessment: Right intertrochanteric femur fracture  Plan: Ersel is a 89 year old male presents with a displaced right intertrochanteric fracture.  I discussed the treatment options with the patient and his family who are at bedside and discussed the risks and benefits of surgical versus nonsurgical management.  Under shared decision making model the family the patient agreed the plan to move forward with open reduction internal fixation of the right hip with intramedullary nail.  A long discussion took place with the patient describing what a intramedullary nail is and what the procedure would entail. The xrays were reviewed with the patient and the implants were discussed. The ability to secure the implant utilizing screws/blade fixation was discussed. Surgical exposures were discussed with the patient.    The hospitalization and post-operative care and rehabilitation were also discussed. The use of perioperative antibiotics and DVT prophylaxis were discussed. The risk, benefits and alternatives to a surgical intervention were discussed at length with the patient. The patient was also advised of risks related to the medical comorbidities. A lengthy discussion took place to review the most common complications including but not limited to: deep  vein thrombosis, pulmonary embolus, heart attack, stroke, infection, wound breakdown, dislocation, numbness, leg length in-equality, damage to nerves, intraoperative fracture, hardware cut out, hardware failure, malunion/ nonunion, tendon,muscles, arteries or other blood vessels, death and other possible complications from anesthesia. The patient was told that we will take steps to minimize these risks by using sterile technique, antibiotics and DVT prophylaxis  when appropriate and follow the patient postoperatively in the office setting to monitor progress. The possibility of recurrent pain, no improvement in pain and actual worsening of pain were also discussed with the patient.      The benefits of surgery were discussed with the patient including the potential for improving the patient's current clinical condition through operative intervention. Alternatives to surgical intervention including conservative management were also discussed in detail. All questions were answered to the satisfaction of the patient. The patient participated and agreed to the plan of care as well as the use of the recommended implants for their surgery.    Plan for surgery today 12/07/2023 N.p.o. for the operating room Hold anticoagulation    Laela Deviney MD  Beeper #:  [phone]  12/07/2023 12:59 PM

## 2023-12-07 NOTE — Progress Notes (Signed)
 Initial Nutrition Assessment  DOCUMENTATION CODES:   Not applicable  INTERVENTION:   -Once diet is advanced, add:   -Ensure Plus High Protein po BID, each supplement provides 350 kcal and 20 grams of protein  -MVI with minerals daily  NUTRITION DIAGNOSIS:   Increased nutrient needs related to post-op healing as evidenced by estimated needs.  GOAL:   Patient will meet greater than or equal to 90% of their needs  MONITOR:   PO intake, Supplement acceptance, Diet advancement  REASON FOR ASSESSMENT:   Consult Assessment of nutrition requirement/status  ASSESSMENT:   Pt with history of hypertension, hyperlipidemia, prior fall status post left hip hemiarthroplasty who presents for chief concerns of a unwitnessed fall from [facility].  Pt admitted with rt hip fracture.   Reviewed I/O's: -410 ml x 24 hours   UOP: 650 ml x 24 hours  5/8 s/p Left hip bipolar hemiarthroplasty  5/12- s/p BSE- advanced to dysphagia 3 diet with thin liquids   Per orthopedics notes, plan for OR today.   Attempted to see pt x 3, however, pt unavailable at times of visits (out of room, in with MD). RD unable to obtain further nutrition-related history or complete nutrition-focused physical exam at this time.    Pt familiar to this RD due to prior admissions. Pt is a resident of [facility]. He was evaluated by SLP last admission and placed on a dysphagia 3 diet.   No wt loss noted over the past 6 months. Per RN notes, pt with moderate edema which may be masking true weight loss as well as fat and muscle depletions.   Pt with increased nutritional needs for post-op healing and would benefit from addition of oral nutrition supplements.   Medications reviewed and include vitamin B-12 and prednisone .   Labs reviewed: CBGS: 108 (inpatient orders for glycemic control are none).    Diet Order:   Diet Order             Diet NPO time specified Except for: Sips with Meds  Diet effective  midnight                   EDUCATION NEEDS:   No education needs have been identified at this time  Skin:  Skin Assessment: Skin Integrity Issues: Skin Integrity Issues:: Incisions Incisions: closed rt hip  Last BM:  Unknown  Height:   Ht Readings from Last 1 Encounters:  12/06/23 5' 8 (1.727 m)    Weight:   Wt Readings from Last 1 Encounters:  12/06/23 70.7 kg    Ideal Body Weight:  70 kg  BMI:  Body mass index is 23.7 kg/m.  Estimated Nutritional Needs:   Kcal:  1900-2100  Protein:  90-105 grams  Fluid:  1.9-2.1 L    Herschel Lords, RD, LDN, CDCES Registered Dietitian III Certified Diabetes Care and Education Specialist If unable to reach this RD, please use RD Inpatient group chat on secure chat between hours of 8am-4 pm daily

## 2023-12-07 NOTE — Anesthesia Preprocedure Evaluation (Signed)
 Anesthesia Evaluation  Patient identified by MRN, date of birth, ID band Patient awake and Patient confused    Reviewed: Allergy & Precautions, NPO status , Patient's Chart, lab work & pertinent test results  Airway Mallampati: III  TM Distance: <3 FB Neck ROM: full    Dental  (+) Missing   Pulmonary neg shortness of breath, former smoker   Pulmonary exam normal        Cardiovascular Exercise Tolerance: Good hypertension, + CAD  Normal cardiovascular exam     Neuro/Psych CVA (right arm), Residual Symptoms  negative psych ROS   GI/Hepatic negative GI ROS, Neg liver ROS,neg GERD  ,,  Endo/Other  negative endocrine ROS    Renal/GU      Musculoskeletal   Abdominal   Peds  Hematology  (+) Blood dyscrasia, anemia   Anesthesia Other Findings Patient has cardiac clearance for this procedure.   Past Medical History: No date: CAD (coronary artery disease) 2008: History of GI bleed No date: History of tobacco use     Comment:  17 pack years, quit around 1970 No date: Hyperlipidemia No date: Hypertension No date: Overweight  Past Surgical History: 2010: CAROTID ENDARTERECTOMY; Left No date: CATARACT EXTRACTION 09/12/02: cypher stent     Comment:  s/p cypher stent mid-LAD 10/29/2023: HIP ARTHROPLASTY; Left     Comment:  Procedure: HEMIARTHROPLASTY (BIPOLAR) HIP, POSTERIOR               APPROACH FOR FRACTURE;  Surgeon: Elner Hahn, MD;                Location: [facility] ORS;  Service: Orthopedics;  Laterality:               Left; 1991: TENDON REPAIR     Comment:  finger and arm  BMI    Body Mass Index: 23.70 kg/m      Reproductive/Obstetrics negative OB ROS                             Anesthesia Physical Anesthesia Plan  ASA: 3  Anesthesia Plan: General ETT   Post-op Pain Management:    Induction: Intravenous  PONV Risk Score and Plan: Ondansetron , Dexamethasone , Midazolam and  Treatment may vary due to age or medical condition  Airway Management Planned: Oral ETT  Additional Equipment:   Intra-op Plan:   Post-operative Plan: Extubation in OR  Informed Consent: I have reviewed the patients History and Physical, chart, labs and discussed the procedure including the risks, benefits and alternatives for the proposed anesthesia with the patient or authorized representative who has indicated his/her understanding and acceptance.   Patient has DNR.  Discussed DNR with patient and Discussed DNR with power of attorney.   Dental Advisory Given  Plan Discussed with: Anesthesiologist, CRNA and Surgeon  Anesthesia Plan Comments: (Patient has limited DNR. Sister says that he is fine with intubation but not CPR for cardioversion in a code scenario per the patients wishes.  Patient and sister consented for risks of anesthesia including but not limited to:  - adverse reactions to medications - damage to eyes, teeth, lips or other oral mucosa - nerve damage due to positioning  - sore throat or hoarseness - Damage to heart, brain, nerves, lungs, other parts of body or loss of life  They voiced understanding and assent.)       Anesthesia Quick Evaluation

## 2023-12-07 NOTE — Progress Notes (Signed)
  Progress Note   Patient: Nicholas Gross ZOX:[id number] DOB: Dec 25, 1927 DOA: 12/06/2023     1 DOS: the patient was seen and examined on 12/07/2023   Brief hospital course: Nicholas Gross is a 89 year old male with history of hypertension, hyperlipidemia, prior fall status post left hip hemiarthroplasty who presents to the ED for chief concerns of a unwitnessed fall from [facility].  See H&P for full HPI on admission & ED course.  Patient was found to have a right intertrochanteric femur fracture. Admitted to medicine service with Orthopedic surgery consulted. Further hospital course and management as outlined below.   Assessment and Plan:  * Right intertrochanteric femoral fracture  --Orthopedic surgery consulted --Plan for IM nail fixation today --NPO until post-op --Pain control per orders --Bowel regimen --PT/OT post-op --Likely return to SNF at [facility] following  Multiple falls --Fall precautions  Hypokalemia Replaced on admission --Monitor BMP, Mg level  Rash - chest and abdomen - present before admission per family. Sees dermatology --PRN Benadryl  --Monitor  Dyslipidemia --Continue rosuvastatin    Leukocytosis Suspect reactive in setting of a femoral fracture.  No fevers or s/sx's of infection. Resolved 15.6 >> 8.9 --Monitor CBC   PAD (peripheral artery disease) (HCC) CAD (coronary artery disease) --ASA held on admission, resume post-op --Continue rosuvastatin    Hypertension --Continue lisinopril   --PRN IV hydralazine         Subjective: pt awake sitting up in bed with sister and niece at bedside.  His speech is very difficult to understand, difficult to make out any complaints.  Family deny acute concerns or issues besdies pt has been itchy this AM.  He's been seeing dermatology for a rash on his chest and abdomen that's been there a while.  Pt has been agreeable to surgery.  His left hip fracture was recent, only about a month  ago.   Physical Exam: Vitals:   12/07/23 0908 12/07/23 1043 12/07/23 1107 12/07/23 1219  BP: (!) 166/80 (!) 160/71 (!) 157/83 (!) 177/78  Pulse: 80 79 80 81  Resp:  19 17 16   Temp:  98.3 F (36.8 C) 98.5 F (36.9 C) 98.7 F (37.1 C)  TempSrc:  Oral Oral Temporal  SpO2:  98% 100% 99%  Weight:      Height:       General exam: awake, alert, no acute distress HEENT: moist mucus membranes, hearing grossly normal  Respiratory system: CTAB, no wheezes, rales or rhonchi, normal respiratory effort. Cardiovascular system: normal S1/S2, RRR Gastrointestinal system: soft, NT, ND, no HSM felt, +bowel sounds. Central nervous system: no gross focal neurologic deficits, baseline dysarthic speech family report is unchanged Extremities: right leg shortened and externally rotated no edema, normal tone Skin: dry, intact, normal temperature, mild rash on chest (present days before admission per family) Psychiatry: normal mood, congruent affect, judgement and insight appear normal    Data Reviewed:  Notable labs --  Bicarb 21 BUN 26 Ca 8.2 Hbg 8.1  Family Communication: at bedside on rounds  Disposition: Status is: Inpatient Remains inpatient appropriate because: surgery today   Planned Discharge Destination: Skilled nursing facility    Time spent: 42 minutes  Author: Montey Apa, DO 12/07/2023 1:43 PM  For on call review [email or web address].

## 2023-12-08 ENCOUNTER — Encounter: Payer: Self-pay | Admitting: Orthopedic Surgery

## 2023-12-08 DIAGNOSIS — I10 Essential (primary) hypertension: Secondary | ICD-10-CM | POA: Diagnosis not present

## 2023-12-08 DIAGNOSIS — R296 Repeated falls: Secondary | ICD-10-CM | POA: Diagnosis not present

## 2023-12-08 DIAGNOSIS — S72141A Displaced intertrochanteric fracture of right femur, initial encounter for closed fracture: Secondary | ICD-10-CM | POA: Diagnosis not present

## 2023-12-08 LAB — BPAM RBC
Blood Product Expiration Date: 202506252359
ISSUE DATE / TIME: 202506161048
Unit Type and Rh: 600

## 2023-12-08 LAB — TYPE AND SCREEN
ABO/RH(D): A NEG
Antibody Screen: NEGATIVE
Unit division: 0

## 2023-12-08 LAB — URINALYSIS, COMPLETE (UACMP) WITH MICROSCOPIC
Bilirubin Urine: NEGATIVE
Glucose, UA: NEGATIVE mg/dL
Hgb urine dipstick: NEGATIVE
Ketones, ur: 5 mg/dL — AB
Nitrite: POSITIVE — AB
Protein, ur: NEGATIVE mg/dL
Specific Gravity, Urine: 1.023 (ref 1.005–1.030)
Squamous Epithelial / HPF: 0 /HPF (ref 0–5)
WBC, UA: >50 WBC/hpf (ref 0–5)
pH: 6 (ref 5.0–8.0)

## 2023-12-08 LAB — BASIC METABOLIC PANEL WITH GFR
Anion gap: 10 (ref 5–15)
BUN: 23 mg/dL (ref 8–23)
CO2: 19 mmol/L — ABNORMAL LOW (ref 22–32)
Calcium: 7.8 mg/dL — ABNORMAL LOW (ref 8.9–10.3)
Chloride: 108 mmol/L (ref 98–111)
Creatinine, Ser: 0.84 mg/dL (ref 0.61–1.24)
GFR, Estimated: >60 mL/min (ref >60)
Glucose, Bld: 117 mg/dL — ABNORMAL HIGH (ref 70–99)
Potassium: 3.9 mmol/L (ref 3.5–5.1)
Sodium: 137 mmol/L (ref 135–145)

## 2023-12-08 LAB — CBC
HCT: 25.5 % — ABNORMAL LOW (ref 39.0–52.0)
Hemoglobin: 8.4 g/dL — ABNORMAL LOW (ref 13.0–17.0)
MCH: 30.8 pg (ref 26.0–34.0)
MCHC: 32.9 g/dL (ref 30.0–36.0)
MCV: 93.4 fL (ref 80.0–100.0)
Platelets: 161 10*3/uL (ref 150–400)
RBC: 2.73 MIL/uL — ABNORMAL LOW (ref 4.22–5.81)
RDW: 16.9 % — ABNORMAL HIGH (ref 11.5–15.5)
WBC: 8.4 10*3/uL (ref 4.0–10.5)
nRBC: 0 % (ref 0.0–0.2)

## 2023-12-08 MED ORDER — ADULT MULTIVITAMIN W/MINERALS CH
1.0000 | ORAL_TABLET | Freq: Every day | ORAL | Status: DC
  Administered 2023-12-09 – 2023-12-14 (×6): 1 via ORAL
  Filled 2023-12-08 (×6): qty 1

## 2023-12-08 NOTE — Evaluation (Signed)
 Physical Therapy Evaluation Patient Details Name: Nicholas Gross MRN: [id number] DOB: 04/07/1928 Today's Date: 12/08/2023  History of Present Illness  [name] is a 89 year old male with history of hypertension, hyperlipidemia, prior fall status post left hip hemiarthroplasty who presents to the ED for chief concerns of a unwitnessed fall from [facility]. Pt is s/p R hip IMN on 12/07/23.   Clinical Impression  Pt admitted with above diagnosis. Pt currently with functional limitations due to the deficits listed below (see PT Problem List). Pt received upright in bed with sister present. Pt and sister unable to provide any insight to pt's condition at SNF prior to admission. Anticipate PTA pt was reliant on staff support for mobility and ADL's.   To date, pt able to follow simple commands with increased time and multimodal cuing throughout session. Pt very restless at first likely due to pt wanting to mobilize. Pt is reliant on TotalA+2 for bed mobility and SBA to CGA for sitting EoB due to intermittent R lateral lean and posterior lean. X2 STS performed needing modA then maxA with bed elevated. Pt with poor standing tolerance and posterior lean due to limited anterior weight shift from limited hip/knee extension with standing. Pt unable to take alteral steps EOB thus returned to sitting then maxA+2 to sit to supine to return upright in bed with all needs in reach. Mitts donned. Pt will benefit from skilled PT services < 3 hours/day to address acute deficits.       If plan is discharge home, recommend the following: Two people to help with bathing/dressing/bathroom;Two people to help with walking and/or transfers;Assistance with cooking/housework;Assist for transportation;Supervision due to cognitive status;Help with stairs or ramp for entrance   Can travel by private vehicle   No    Equipment Recommendations None recommended by PT  Recommendations for Other Services        Functional Status Assessment Patient has had a recent decline in their functional status and/or demonstrates limited ability to make significant improvements in function in a reasonable and predictable amount of time     Precautions / Restrictions Precautions Precautions: Fall Recall of Precautions/Restrictions: Impaired Restrictions Weight Bearing Restrictions Per Provider Order: Yes RLE Weight Bearing Per Provider Order: Weight bearing as tolerated      Mobility  Bed Mobility Overal bed mobility: Needs Assistance Bed Mobility: Supine to Sit, Sit to Supine     Supine to sit: Total assist, +2 for physical assistance Sit to supine: Max assist, +2 for physical assistance     Patient Response: Cooperative, Flat affect  Transfers Overall transfer level: Needs assistance Equipment used: Rolling walker (2 wheels) Transfers: Sit to/from Stand Sit to Stand: Mod assist, Max assist, +2 physical assistance, From elevated surface           General transfer comment: first attempt modA+2. Second maxA+2 with poor knee and hip extension    Ambulation/Gait               General Gait Details: unable/unsafe  Stairs            Wheelchair Mobility     Tilt Bed Tilt Bed Patient Response: Cooperative, Flat affect  Modified Rankin (Stroke Patients Only)       Balance Overall balance assessment: Needs assistance Sitting-balance support: Feet supported, Bilateral upper extremity supported Sitting balance-[name]: Poor Sitting balance - Comments: intermittent R lateral and posterior lean. Progresses to fair Postural control: Posterior lean, Right lateral lean Standing balance support: Bilateral upper extremity  supported, During functional activity, Reliant on assistive device for balance Standing balance-[name]: Poor Standing balance comment: poor hip/knee extension                             Pertinent Vitals/Pain Pain Assessment Pain  Assessment: Faces Faces Pain Scale: Hurts little more Pain Location: R hip Pain Descriptors / Indicators: Grimacing, Discomfort Pain Intervention(s): Limited activity within patient's tolerance, Monitored during session, Premedicated before session, Repositioned    Home Living Family/patient expects to be discharged to:: Skilled nursing facility                   Additional Comments: From [facility] STR    Prior Function Prior Level of Function : Patient poor historian/Family not available             Mobility Comments: Anticipate heavy physical support being from STR. ADLs Comments: Anticipate need from staff to assist     Extremity/Trunk Assessment   Upper Extremity Assessment Upper Extremity Assessment: Defer to OT evaluation    Lower Extremity Assessment Lower Extremity Assessment: Generalized weakness;RLE deficits/detail RLE Deficits / Details: R hip IMN    Cervical / Trunk Assessment Cervical / Trunk Assessment: Normal  Communication   Communication Communication: Impaired Factors Affecting Communication: Difficulty expressing self    Cognition Arousal: Alert Behavior During Therapy: Restless, Flat affect   PT - Cognitive impairments: Difficult to assess, Attention, Safety/Judgement, Awareness Difficult to assess due to: Impaired communication                     PT - Cognition Comments: able to follow single step commands with time and cuing Following commands: Impaired Following commands impaired: Follows one step commands inconsistently, Follows one step commands with increased time     Cueing Cueing Techniques: Verbal cues, Gestural cues, Tactile cues     General Comments      Exercises General Exercises - Lower Extremity Long Arc Quad: AROM, Strengthening, Right, 10 reps, Seated   Assessment/Plan    PT Assessment Patient needs continued PT services  PT Problem List Decreased strength;Decreased mobility;Decreased activity  tolerance;Decreased balance;Decreased knowledge of use of DME;Pain;Decreased safety awareness       PT Treatment Interventions DME instruction;Therapeutic exercise;Gait training;Balance training;Stair training;Neuromuscular re-education;Functional mobility training;Cognitive remediation;Therapeutic activities;Patient/family education    PT Goals (Current goals can be found in the Care Plan section)  Acute Rehab PT Goals PT Goal Formulation: Patient unable to participate in goal setting Time For Goal Achievement: 12/22/23    Frequency 7X/week     Co-evaluation PT/OT/SLP Co-Evaluation/Treatment: Yes Reason for Co-Treatment: Complexity of the patient's impairments (multi-system involvement);For patient/therapist safety;To address functional/ADL transfers PT goals addressed during session: Mobility/safety with mobility;Strengthening/ROM;Proper use of DME;Balance OT goals addressed during session: ADL's and self-care       AM-PAC PT 6 Clicks Mobility  Outcome Measure Help needed turning from your back to your side while in a flat bed without using bedrails?: Total Help needed moving from lying on your back to sitting on the side of a flat bed without using bedrails?: Total Help needed moving to and from a bed to a chair (including a wheelchair)?: Total Help needed standing up from a chair using your arms (e.g., wheelchair or bedside chair)?: A Lot Help needed to walk in hospital room?: Total Help needed climbing 3-5 steps with a railing? : Total 6 Click Score: 7    End of Session Equipment  Utilized During Treatment: Gait belt Activity Tolerance: Patient limited by fatigue Patient left: in bed;with call bell/phone within reach;with bed alarm set;with family/visitor present;with SCD's reapplied;Other (comment) (mitts donned) Nurse Communication: Mobility status PT Visit Diagnosis: Muscle weakness (generalized) (M62.81);Difficulty in walking, not elsewhere classified (R26.2);History  of falling (Z91.81);Repeated falls (R29.6);Pain Pain - Right/Left: Right Pain - part of body: Hip    Time: 1040-1107 PT Time Calculation (min) (ACUTE ONLY): 27 min   Charges:   PT Evaluation $PT Eval Moderate Complexity: 1 Mod PT Treatments $Therapeutic Activity: 8-22 mins PT General Charges $$ ACUTE PT VISIT: 1 Visit         Marc Senior. Fairly IV, PT, DPT Physical Therapist- [place]  [facility] 12/08/2023, 1:04 PM

## 2023-12-08 NOTE — Anesthesia Postprocedure Evaluation (Signed)
 Anesthesia Post Note  Patient: Amerigo Mcglory Nees  Procedure(s) Performed: FIXATION, FRACTURE, INTERTROCHANTERIC, WITH INTRAMEDULLARY ROD (Right: Hip)  Patient location during evaluation: PACU Anesthesia Type: General Level of consciousness: awake and alert Pain management: pain level controlled Vital Signs Assessment: post-procedure vital signs reviewed and stable Respiratory status: spontaneous breathing, nonlabored ventilation, respiratory function stable and patient connected to nasal cannula oxygen Cardiovascular status: blood pressure returned to baseline and stable Postop Assessment: no apparent nausea or vomiting Anesthetic complications: no   No notable events documented.   Last Vitals:  Vitals:   12/08/23 0356 12/08/23 0748  BP: (!) 150/77 (!) 160/71  Pulse: 72 72  Resp: 16 15  Temp: 36.9 C 36.8 C  SpO2: 100% 97%    Last Pain:  Vitals:   12/08/23 0748  TempSrc: Oral  PainSc:                  Portia Brittle Azha Constantin

## 2023-12-08 NOTE — Progress Notes (Signed)
  Progress Note   Patient: Nicholas Gross ZOX:[id number] DOB: [birth year] DOA: 12/06/2023     2 DOS: the patient was seen and examined on 12/08/2023   Brief hospital course: Nicholas Gross is a 89 year old male with history of hypertension, hyperlipidemia, prior fall status post left hip hemiarthroplasty who presents to the ED for chief concerns of a unwitnessed fall from [facility].  See H&P for full HPI on admission & ED course.  Patient was found to have a right intertrochanteric femur fracture. Admitted to medicine service with Orthopedic surgery consulted. Further hospital course and management as outlined below.   Assessment and Plan:  * Right intertrochanteric femoral fracture  --Orthopedic surgery consulted --underwent IM nail fixation 6/16 --Pain control per orders --Bowel regimen --PT/OT post-op --Likely return to SNF at [facility] following  Multiple falls --Fall precautions  Hypokalemia Replaced on admission --Monitor BMP, Mg level  Rash - chest and abdomen - present before admission per family. --Sees dermatology - follow up outpatient --PRN Benadryl  --Monitor  Dyslipidemia --Continue rosuvastatin    Leukocytosis Suspect reactive in setting of a femoral fracture.  No fevers or s/sx's of infection. Resolved 15.6 >> 8.9 >> 10.6 >> 8.4 --Monitor CBC   PAD (peripheral artery disease) (HCC) CAD (coronary artery disease) --ASA held on admission, resume post-op --Continue rosuvastatin    Hypertension --Continue lisinopril   --PRN IV hydralazine         Subjective: Pt seen with sister and a friend at bedside this AM.  He denies pain or other complaints.  He was up there therapy earlier, family report he did well.     Physical Exam: Vitals:   12/07/23 2229 12/07/23 2356 12/08/23 0356 12/08/23 0748  BP: 139/71 (!) 158/71 (!) 150/77 (!) 160/71  Pulse: 66 68 72 72  Resp: 16 18 16 15   Temp: 97.7 F (36.5 C) 97.9 F (36.6 C) 98.4 F  (36.9 C) 98.3 F (36.8 C)  TempSrc: Axillary   Oral  SpO2: 100% 100% 100% 97%  Weight:      Height:       General exam: awake, alert, no acute distress HEENT: moist mucus membranes, hearing grossly normal  Respiratory system: CTAB, no wheezes, rales or rhonchi, normal respiratory effort. Cardiovascular system: normal S1/S2, RRR Gastrointestinal system: soft, NT, ND, no HSM felt, +bowel sounds. Central nervous system: no gross focal neurologic deficits, baseline dysarthic speech stable Extremities: RLE dressing clean dry intact, no edema Skin: dry, intact, normal temperature, mild rash on chest (present days before admission per family) Psychiatry: normal mood, congruent affect, judgement and insight appear normal    Data Reviewed:  Notable labs --  Bicarb 21 >> 19 Glucose 117 Ca 7.8  Hbg 8.1 (pre-op RBC transfusion) >> 9.5 >> 8.4   Family Communication: sister and neighbor at bedside on rounds  Disposition: Status is: Inpatient Remains inpatient appropriate because: surgery today   Planned Discharge Destination: Skilled nursing facility    Time spent: 35 minutes  Author: Montey Apa, DO 12/08/2023 2:32 PM  For on call review [email or web address].

## 2023-12-08 NOTE — Plan of Care (Signed)
  Problem: Clinical Measurements: Goal: Will remain free from infection Outcome: Progressing   Problem: Elimination: Goal: Will not experience complications related to bowel motility Outcome: Progressing Goal: Will not experience complications related to urinary retention Outcome: Progressing   Problem: Pain Managment: Goal: General experience of comfort will improve and/or be controlled Outcome: Progressing   Problem: Safety: Goal: Ability to remain free from injury will improve Outcome: Progressing   Problem: Skin Integrity: Goal: Risk for impaired skin integrity will decrease Outcome: Progressing

## 2023-12-08 NOTE — Evaluation (Signed)
 Occupational Therapy Evaluation Patient Details Name: Nicholas Gross MRN: [id number] DOB: 02-05-1928 Today's Date: 12/08/2023   History of Present Illness   Pt is a 89 y.o.male admitted with unwitnessed fall, s/p R hip IMN on 12/07/23. PMH significant for frequent falls, R pubic rami fx, HTN, HLD, s/p L hip hemiarthroplasty on 10/29/23, rib fx, CVA with right sided weakness     Clinical Impressions Pt admitted with above diagnosis from [facility]. Prior to recent falls causing hospitalizations, pt was mod independent using RW and living alone. Info from chart review as pt is difficult to understand; sister present but cannot assist with PLOF or cognitive status. Pt follows simple commands and puts forth good effort, restless upon arrival likely due to prolonged period in bed. TOTAL A +2 for bed mobility, external support required to maintain EOB static balance due to R lateral and posterior lean. Pt able to perform STS with MOD A +2 initial attempt, and MAX A +2 second attempt. Anticipate pt requiring MAX A for bed level ADLs at this time. Pt would benefit from skilled OT services to address noted impairments and functional limitations (see below for any additional details) in order to maximize safety and independence while minimizing falls risk and caregiver burden. Anticipate the need for follow up OT services upon acute hospital DC.      If plan is discharge home, recommend the following:   Two people to help with walking and/or transfers;Two people to help with bathing/dressing/bathroom;Direct supervision/assist for medications management;Direct supervision/assist for financial management;Help with stairs or ramp for entrance;Assist for transportation;Supervision due to cognitive status     Functional Status Assessment   Patient has had a recent decline in their functional status and demonstrates the ability to make significant improvements in function in a reasonable and predictable  amount of time.     Equipment Recommendations   Other (comment)      Precautions/Restrictions   Precautions Precautions: Fall Recall of Precautions/Restrictions: Impaired Restrictions Weight Bearing Restrictions Per Provider Order: Yes RLE Weight Bearing Per Provider Order: Weight bearing as tolerated Other Position/Activity Restrictions: hx of rib fx, R pubic rami fx and L hip arthroplasty     Mobility Bed Mobility Overal bed mobility: Needs Assistance Bed Mobility: Supine to Sit, Sit to Supine     Supine to sit: Total assist, +2 for physical assistance Sit to supine: Max assist, +2 for physical assistance   General bed mobility comments: pt motivated to get OOB, restless improving once given opportinity to exit and sit EOB    Transfers Overall transfer level: Needs assistance Equipment used: Rolling walker (2 wheels) Transfers: Sit to/from Stand Sit to Stand: Mod assist, Max assist, +2 physical assistance, From elevated surface           General transfer comment: first attempt modA+2. Second maxA+2 with poor knee and hip extension. bilateral feet blocked, poor standing tolerance      Balance Overall balance assessment: Needs assistance Sitting-balance support: Feet supported, Bilateral upper extremity supported Sitting balance-[name]: Poor Sitting balance - Comments: intermittent R lateral and posterior lean. Progresses to fair Postural control: Posterior lean, Right lateral lean Standing balance support: Bilateral upper extremity supported, During functional activity, Reliant on assistive device for balance Standing balance-[name]: Poor Standing balance comment: poor hip/knee extension                           ADL either performed or assessed with clinical judgement  ADL Overall ADL's : Needs assistance/impaired Eating/Feeding: Maximal assistance;Bed level   Grooming: Maximal assistance;Sitting   Upper Body Bathing: Maximal  assistance;Bed level   Lower Body Bathing: Maximal assistance;Bed level   Upper Body Dressing : Maximal assistance;Bed level   Lower Body Dressing: Sitting/lateral leans;Bed level   Toilet Transfer: BSC/3in1;Stand-pivot;+2 for physical assistance;+2 for safety/equipment;Maximal assistance Toilet Transfer Details (indicate cue type and reason): unable to attempt this session. pt will need +2 to progress towards t/f [facility] Toileting- Clothing Manipulation and Hygiene: Bed level;Maximal assistance;+2 for physical assistance Toileting - Clothing Manipulation Details (indicate cue type and reason): anticipate need for bed level toileting at this time     Functional mobility during ADLs: Cueing for safety;Cueing for sequencing;Rolling walker (2 wheels);+2 for physical assistance;+2 for safety/equipment;Maximal assistance        Pertinent Vitals/Pain Pain Assessment Pain Assessment: Faces Faces Pain Scale: Hurts little more Pain Location: R hip Pain Descriptors / Indicators: Grimacing, Discomfort Pain Intervention(s): Limited activity within patient's tolerance, Monitored during session, Premedicated before session, Repositioned (RN notified of s/s of pain and likely needing pain meds end of session)     Extremity/Trunk Assessment Upper Extremity Assessment Upper Extremity Assessment: Generalized weakness   Lower Extremity Assessment Lower Extremity Assessment: Defer to PT evaluation RLE Deficits / Details: R hip IMN   Cervical / Trunk Assessment Cervical / Trunk Assessment: Normal   Communication Communication Communication: Impaired Factors Affecting Communication: Difficulty expressing self;Reduced clarity of speech   Cognition Arousal: Alert Behavior During Therapy: Restless, Flat affect Cognition: Cognition impaired     Awareness: Intellectual awareness impaired Memory impairment (select all impairments): Short-term memory, Working memory, Non-declarative long-term  memory Attention impairment (select first level of impairment): Focused attention Executive functioning impairment (select all impairments): Initiation, Organization, Sequencing, Reasoning, Problem solving OT - Cognition Comments: hard to understand pt due to decreased speech clarity. pt follows commands and motivated to get OOB.                 Following commands: Impaired Following commands impaired: Follows one step commands inconsistently, Follows one step commands with increased time     Cueing  General Comments   Cueing Techniques: Verbal cues;Gestural cues;Tactile cues  RN notified of mittens placed back on due to pt pulling at IV, R elbow bandage dressing with blood, and new L elbow skin tear. RUE swollen, elevated on pillow           Home Living Family/patient expects to be discharged to:: Skilled nursing facility                                 Additional Comments: From [facility] STR      Prior Functioning/Environment Prior Level of Function : Patient poor historian/Family not available             Mobility Comments: Anticipate heavy physical support being from STR. ADLs Comments: Anticipate need from staff to assist    OT Problem List: Decreased strength;Decreased range of motion;Decreased activity tolerance;Impaired balance (sitting and/or standing);Decreased cognition;Decreased safety awareness;Pain;Decreased knowledge of precautions;Increased edema   OT Treatment/Interventions: Self-care/ADL training;Therapeutic exercise;Neuromuscular education;Energy conservation;DME and/or AE instruction;Cognitive remediation/compensation;Balance training;Patient/family education      OT Goals(Current goals can be found in the care plan section)   Acute Rehab OT Goals OT Goal Formulation: Patient unable to participate in goal setting Time For Goal Achievement: 12/22/23   OT Frequency:  Min 1X/week  Co-evaluation PT/OT/SLP  Co-Evaluation/Treatment: Yes Reason for Co-Treatment: Complexity of the patient's impairments (multi-system involvement);For patient/therapist safety;To address functional/ADL transfers PT goals addressed during session: Mobility/safety with mobility;Strengthening/ROM;Proper use of DME;Balance OT goals addressed during session: ADL's and self-care      AM-PAC OT 6 Clicks Daily Activity     Outcome Measure Help from another person eating meals?: A Little Help from another person taking care of personal grooming?: A Little Help from another person toileting, which includes using toliet, bedpan, or urinal?: Total Help from another person bathing (including washing, rinsing, drying)?: Total Help from another person to put on and taking off regular upper body clothing?: Total Help from another person to put on and taking off regular lower body clothing?: Total 6 Click Score: 10   End of Session Equipment Utilized During Treatment: Gait belt;Rolling walker (2 wheels) Nurse Communication: Mobility status;Patient requests pain meds;Other (comment) (bilat mitts donned, skin tear L elbow, dressing on R elbow with blood)  Activity Tolerance: Patient tolerated treatment well Patient left: in bed;with call bell/phone within reach;with bed alarm set;with family/visitor present  OT Visit Diagnosis: History of falling (Z91.81);Muscle weakness (generalized) (M62.81);Other abnormalities of gait and mobility (R26.89);Repeated falls (R29.6)                Time: 4098-1191 OT Time Calculation (min): 28 min Charges:  OT General Charges $OT Visit: 1 Visit OT Evaluation $OT Eval Moderate Complexity: 1 Mod  Verne Cove L. Tinsley Lomas, OTR/L  12/08/23, 3:21 PM

## 2023-12-09 DIAGNOSIS — S72141A Displaced intertrochanteric fracture of right femur, initial encounter for closed fracture: Secondary | ICD-10-CM | POA: Diagnosis not present

## 2023-12-09 LAB — BASIC METABOLIC PANEL WITH GFR
Anion gap: 6 (ref 5–15)
BUN: 29 mg/dL — ABNORMAL HIGH (ref 8–23)
CO2: 24 mmol/L (ref 22–32)
Calcium: 7.8 mg/dL — ABNORMAL LOW (ref 8.9–10.3)
Chloride: 109 mmol/L (ref 98–111)
Creatinine, Ser: 0.85 mg/dL (ref 0.61–1.24)
GFR, Estimated: >60 mL/min
Glucose, Bld: 97 mg/dL (ref 70–99)
Potassium: 3.5 mmol/L (ref 3.5–5.1)
Sodium: 139 mmol/L (ref 135–145)

## 2023-12-09 LAB — CBC
HCT: 25.4 % — ABNORMAL LOW (ref 39.0–52.0)
Hemoglobin: 8.4 g/dL — ABNORMAL LOW (ref 13.0–17.0)
MCH: 30.7 pg (ref 26.0–34.0)
MCHC: 33.1 g/dL (ref 30.0–36.0)
MCV: 92.7 fL (ref 80.0–100.0)
Platelets: 181 10*3/uL (ref 150–400)
RBC: 2.74 MIL/uL — ABNORMAL LOW (ref 4.22–5.81)
RDW: 16.1 % — ABNORMAL HIGH (ref 11.5–15.5)
WBC: 8.5 10*3/uL (ref 4.0–10.5)
nRBC: 0 % (ref 0.0–0.2)

## 2023-12-09 NOTE — Plan of Care (Signed)
   Problem: Health Behavior/Discharge Planning: Goal: Ability to manage health-related needs will improve Outcome: Progressing

## 2023-12-09 NOTE — NC FL2 (Signed)
 [place]  MEDICAID [id number] LEVEL OF CARE FORM     IDENTIFICATION  Patient Name: Nicholas Gross Birthdate: Jan 13, 1928 Sex: male Admission Date (Current Location): 12/06/2023  [facility] [place] Number:  Chiropodist and Address:  [facility], [address] [place], [place] [address]      Provider Number: [id number]  Attending Physician Name and Address:  Garrison Kanner, MD  Relative Name and Phone Number:  Sister: Elnor Hail, [phone]    Current Level of Care: Hospital Recommended Level of Care: Skilled Nursing Facility Prior Approval Number:    Date Approved/Denied:   PASRR Number: [id number]  Discharge Plan: SNF    Current Diagnoses: Patient Active Problem List   Diagnosis Date Noted   Right femoral fracture (HCC) 12/06/2023   Leukocytosis 12/06/2023   Malnutrition of moderate degree 10/30/2023   Closed displaced fracture of left femoral neck (HCC) 10/29/2023   Dysarthria as late effect of cerebrovascular accident (CVA) 10/29/2023   Urinary tract infection 10/29/2023   Fracture of pubic ramus with delayed healing, unspecified laterality 10/05/2023   Bullous pemphigoid 10/05/2023   Functional quadriplegia (HCC) 10/05/2023   Hypernatremia 09/30/2023   Pressure injury of skin 09/30/2023   Fall 09/29/2023   Urinary retention 09/29/2023   Hypokalemia 09/29/2023   Hematoma of extraperitoneal space 09/29/2023   Acute encephalopathy 09/28/2023   Weight loss 06/05/2023   Bullous disorder 06/01/2023   Mesenteric mass 06/01/2023   Dysarthria 04/17/2023   Tremor 04/17/2023   Aphasia 04/10/2023   Dysphagia 04/10/2023   History of falling 03/24/2023   Long term (current) use of aspirin  03/24/2023   Occlusion and stenosis of unspecified carotid artery 03/24/2023   Pain and swelling of lower leg 09/15/2022   Right acetabular fracture (HCC) 06/17/2022   Aortic stenosis, mild 04/25/2022   Cellulitis of both lower extremities  04/24/2022   Aneurysm of ascending aorta without rupture (HCC) 12/23/2021   Compression fracture of L1 lumbar vertebra (HCC) 12/23/2021   Pulmonary nodule 12/23/2021   Right inguinal hernia 12/23/2021   Osteoarthritis of right hip 12/23/2021   Aortic atherosclerosis (HCC) 12/23/2021   Multiple falls 12/12/2021   Left rib fracture 12/12/2021   Senile purpura (HCC) 11/08/2021   PAD (peripheral artery disease) (HCC) 07/15/2020   Lymphedema 05/06/2019   Dyslipidemia 02/13/2016   Macular degeneration 08/15/2015   Carotid atherosclerosis 08/15/2015   Goals of care, counseling/discussion 02/12/2015   Allergic rhinitis due to pollen 02/12/2015   Hypertension    CAD (coronary artery disease)    H/O cardiac catheterization 06/12/2014   History of left-sided carotid endarterectomy 06/12/2014    Orientation RESPIRATION BLADDER Height & Weight     Self, Time, Situation, Place  Normal Continent, Incontinent Weight: 70.7 kg Height:  5' 8 (172.7 cm)  BEHAVIORAL SYMPTOMS/MOOD NEUROLOGICAL BOWEL NUTRITION STATUS      Continent Diet (Dysphagia I, thin liquids)  AMBULATORY STATUS COMMUNICATION OF NEEDS Skin   Extensive Assist Verbally Surgical wounds                       Personal Care Assistance Level of Assistance  Bathing, Feeding, Dressing Bathing Assistance: Maximum assistance Feeding assistance: Limited assistance Dressing Assistance: Maximum assistance     Functional Limitations Info             SPECIAL CARE FACTORS FREQUENCY  PT (By licensed PT), OT (By licensed OT)     PT Frequency: 5-7 times per week OT Frequency: 5-7 times per week  Contractures Contractures Info: Not present    Additional Factors Info  Code Status, Allergies Code Status Info: DNR-Limited Allergies Info: Simvastatin , Contrast Dye           Current Medications (12/09/2023):  This is the current hospital active medication list Current Facility-Administered Medications   Medication Dose Route Frequency Provider Last Rate Last Admin   acetaminophen  (TYLENOL ) tablet 325-650 mg  325-650 mg Oral Q6H PRN Aberman, Zachary, MD       artificial tears ophthalmic solution 1 drop  1 drop Both Eyes TID PRN Cox, Amy N, DO       cyanocobalamin  (VITAMIN B12) tablet 250 mcg  250 mcg Oral Daily Cox, Amy N, DO   250 mcg at 12/08/23 7829   diphenhydrAMINE  (BENADRYL ) injection 12.5 mg  12.5 mg Intravenous Q8H PRN Darus Engels A, DO   12.5 mg at 12/07/23 1021   docusate sodium  (COLACE) capsule 100 mg  100 mg Oral BID Aberman, Zachary, MD   100 mg at 12/08/23 2212   enoxaparin  (LOVENOX ) injection 40 mg  40 mg Subcutaneous Q24H Aberman, Zachary, MD   40 mg at 12/08/23 5621   feeding supplement (ENSURE PLUS HIGH PROTEIN) liquid 237 mL  237 mL Oral BID BM Darus Engels A, DO   237 mL at 12/08/23 1403   hydrALAZINE  (APRESOLINE ) injection 5 mg  5 mg Intravenous Q6H PRN Cox, Amy N, DO       HYDROcodone -acetaminophen  (NORCO) 7.5-325 MG per tablet 1-2 tablet  1-2 tablet Oral Q4H PRN Aberman, Zachary, MD   1 tablet at 12/08/23 1202   lisinopril  (ZESTRIL ) tablet 5 mg  5 mg Oral Daily Cox, Amy N, DO   5 mg at 12/08/23 3086   menthol-cetylpyridinium (CEPACOL) lozenge 3 mg  1 lozenge Oral PRN Aberman, Zachary, MD       Or   phenol (CHLORASEPTIC) mouth spray 1 spray  1 spray Mouth/Throat PRN Aberman, Zachary, MD       metoCLOPramide  (REGLAN ) tablet 5-10 mg  5-10 mg Oral Q8H PRN Aberman, Zachary, MD       Or   metoCLOPramide  (REGLAN ) injection 5-10 mg  5-10 mg Intravenous Q8H PRN Aberman, Zachary, MD       morphine  (PF) 2 MG/ML injection 2 mg  2 mg Intravenous Q2H PRN Darus Engels A, DO   2 mg at 12/07/23 2242   multivitamin with minerals tablet 1 tablet  1 tablet Oral Daily Page Boast, [facility]       ondansetron  (ZOFRAN ) injection 4 mg  4 mg Intravenous Q6H PRN Cox, Amy N, DO   4 mg at 12/07/23 1430   predniSONE  (DELTASONE ) tablet 20 mg  20 mg Oral Q breakfast Cox, Amy N, DO   20 mg at  12/08/23 5784   rosuvastatin  (CRESTOR ) tablet 10 mg  10 mg Oral QHS Cox, Amy N, DO   10 mg at 12/08/23 2212   senna-docusate (Senokot-S) tablet 1 tablet  1 tablet Oral QHS PRN Cox, Amy N, DO         Discharge Medications: Please see discharge summary for a list of discharge medications.  Relevant Imaging Results:  Relevant Lab Results:   Additional Information SSN: [id number]  Alexandra Ice, RN

## 2023-12-09 NOTE — Progress Notes (Signed)
   Subjective: 2 Days Post-Op Procedure(s) (LRB): FIXATION, FRACTURE, INTERTROCHANTERIC, WITH INTRAMEDULLARY ROD (Right) Patient is a difficult historian. Resting in bed this am.  Plan is to go Skilled nursing facility after hospital stay.  Objective: Vital signs in last 24 hours: Temp:  [97.4 F (36.3 C)-98.4 F (36.9 C)] 97.5 F (36.4 C) (06/18 0441) Pulse Rate:  [64-70] 66 (06/18 0441) Resp:  [16-18] 18 (06/18 0441) BP: (127-163)/(65-69) 163/69 (06/18 0441) SpO2:  [100 %] 100 % (06/18 0441)  Intake/Output from previous day: 06/17 0701 - 06/18 0700 In: -  Out: 2600 [Urine:2600] Intake/Output this shift: No intake/output data recorded.  Recent Labs    12/06/23 1032 12/07/23 0207 12/07/23 1530 12/08/23 0440 12/09/23 0510  HGB 8.9* 8.1* 9.5* 8.4* 8.4*   Recent Labs    12/08/23 0440 12/09/23 0510  WBC 8.4 8.5  RBC 2.73* 2.74*  HCT 25.5* 25.4*  PLT 161 181   Recent Labs    12/08/23 0440 12/09/23 0510  NA 137 139  K 3.9 3.5  CL 108 109  CO2 19* 24  BUN 23 29*  CREATININE 0.84 0.85  GLUCOSE 117* 97  CALCIUM  7.8* 7.8*   Recent Labs    12/06/23 1032  INR 1.1    EXAM General - Patient is Alert and Appropriate Extremity - Neurovascular intact Sensation intact distally Intact pulses distally Dorsiflexion/Plantar flexion intact No cellulitis present Compartment soft Mild ecchymosis along right posterior hip Dressing - dressing C/D/I Motor Function - intact, moving foot and toes well on exam.   Past Medical History:  Diagnosis Date   CAD (coronary artery disease)    History of GI bleed 2008   History of tobacco use    17 pack years, quit around 1970   Hyperlipidemia    Hypertension    Overweight     Assessment/Plan:   2 Days Post-Op Procedure(s) (LRB): FIXATION, FRACTURE, INTERTROCHANTERIC, WITH INTRAMEDULLARY ROD (Right) Principal Problem:   Right femoral fracture (HCC) Active Problems:   Hypertension   CAD (coronary artery disease)    Dyslipidemia   H/O cardiac catheterization   PAD (peripheral artery disease) (HCC)   Senile purpura (HCC)   Multiple falls   Osteoarthritis of right hip   Hypokalemia   Leukocytosis  Estimated body mass index is 23.7 kg/m as calculated from the following:   Height as of this encounter: 5' 8 (1.727 m).   Weight as of this encounter: 70.7 kg. Advance diet Up with therapy Pain controlled Vital signs stable Labs stable, Hgb 8.4 CM to assist with discharge to SNF  Follow up with [facility] in 2 weeks Lovenox  40 mg SQ daily x 14 days at discharge   DVT Prophylaxis - Lovenox , TED hose, and SCDs Weight-Bearing as tolerated to right leg   T. Thomos Flies, PA-C [facility] 12/09/2023, 7:51 AM

## 2023-12-09 NOTE — Progress Notes (Signed)
 Physical Therapy Treatment Patient Details Name: Nicholas Gross MRN: [id number] DOB: Jan 07, 1928 Today's Date: 12/09/2023   History of Present Illness Pt is a 89 y.o.male admitted with unwitnessed fall, s/p R hip IMN on 12/07/23. PMH significant for frequent falls, R pubic rami fx, HTN, HLD, s/p L hip hemiarthroplasty on 10/29/23, rib fx, CVA with right sided weakness  Pt continues to require total A-Max A +2 for mobility overall.  Pt did make progress by tolerating a squat - pivot transfer to the recliner to sit up for breakfast. Continued PT will assist pt towards standing tolerance, LE strengthening, and activity tolerance to increase safety and independence and decrease burden of care with functional mobility.    PT Comments     If plan is discharge home, recommend the following: Two people to help with bathing/dressing/bathroom;Two people to help with walking and/or transfers;Assistance with cooking/housework;Assist for transportation;Supervision due to cognitive status;Help with stairs or ramp for entrance   Can travel by private vehicle     No  Equipment Recommendations  None recommended by PT    Recommendations for Other Services       Precautions / Restrictions Precautions Precautions: Fall Recall of Precautions/Restrictions: Impaired Restrictions Weight Bearing Restrictions Per Provider Order: Yes RLE Weight Bearing Per Provider Order: Weight bearing as tolerated Other Position/Activity Restrictions: hx of rib fx, R pubic rami fx and R hip arthroplasty     Mobility  Bed Mobility Overal bed mobility: Needs Assistance Bed Mobility: Supine to Sit     Supine to sit: Total assist, +2 for physical assistance     General bed mobility comments: Pt did not seem motivated or demonstrate initative to get OOB but was not agitated    Transfers Overall transfer level: Needs assistance Equipment used: Rolling walker (2 wheels) Transfers: Sit to/from Stand, Bed to  chair/wheelchair/BSC Sit to Stand: Max assist, +2 physical assistance     Squat pivot transfers: Total assist, +2 physical assistance     General transfer comment: maxA+2 with poor knee and hip extension during sit <> stand. bilateral feet blocked, poor standing tolerance, and pt was definately pushing backwards with 2nd/3rd sit<>stand and squat-pivot transfer.    Ambulation/Gait               General Gait Details: unable/unsafe   Stairs             Wheelchair Mobility     Tilt Bed    Modified Rankin (Stroke Patients Only)       Balance Overall balance assessment: Needs assistance Sitting-balance support: Feet supported, Bilateral upper extremity supported Sitting balance-[name]: Poor Sitting balance - Comments: intermittent posterior lean. Progresses to fair Postural control: Posterior lean, Left lateral lean Standing balance support: Bilateral upper extremity supported, During functional activity, Reliant on assistive device for balance Standing balance-[name]: Poor Standing balance comment: poor hip/knee extension                            Communication Communication Communication: Impaired Factors Affecting Communication: Difficulty expressing self;Reduced clarity of speech  Cognition Arousal: Alert Behavior During Therapy: Restless, Flat affect   PT - Cognitive impairments: Difficult to assess, Attention, Safety/Judgement, Awareness Difficult to assess due to: Impaired communication                       Following commands: Impaired Following commands impaired: Follows one step commands inconsistently, Follows one step commands with increased  time    Cueing Cueing Techniques: Verbal cues, Gestural cues, Tactile cues  Exercises      General Comments        Pertinent Vitals/Pain Pain Assessment Pain Assessment: Faces Faces Pain Scale: Hurts little more Pain Location: R hip Pain Descriptors / Indicators:  Grimacing, Discomfort Pain Intervention(s): Limited activity within patient's tolerance, Monitored during session, Other (comment) (RN notified of s/s of pain and possible need of pain meds end of session)    Home Living                          Prior Function            PT Goals (current goals can now be found in the care plan section) Acute Rehab PT Goals PT Goal Formulation: Patient unable to participate in goal setting Time For Goal Achievement: 12/22/23 Progress towards PT goals: Progressing toward goals    Frequency    7X/week      PT Plan      Co-evaluation              AM-PAC PT 6 Clicks Mobility   Outcome Measure  Help needed turning from your back to your side while in a flat bed without using bedrails?: Total Help needed moving from lying on your back to sitting on the side of a flat bed without using bedrails?: Total Help needed moving to and from a bed to a chair (including a wheelchair)?: Total Help needed standing up from a chair using your arms (e.g., wheelchair or bedside chair)?: Total Help needed to walk in hospital room?: Total Help needed climbing 3-5 steps with a railing? : Total 6 Click Score: 6    End of Session Equipment Utilized During Treatment: Gait belt Activity Tolerance: Patient limited by pain Patient left: in chair;with call bell/phone within reach;with chair alarm set (donned pt's R mitt but was unable to donn L mitt; pt holding on to it so tightly we couldn't get it out of pt's hand.) Nurse Communication: Mobility status PT Visit Diagnosis: Muscle weakness (generalized) (M62.81);Difficulty in walking, not elsewhere classified (R26.2);History of falling (Z91.81);Repeated falls (R29.6);Pain Pain - Right/Left: Right Pain - part of body: Hip     Time: 1191-4782 PT Time Calculation (min) (ACUTE ONLY): 26 min  Charges:    $Therapeutic Activity: 23-37 mins PT General Charges $$ ACUTE PT VISIT: 1 Visit                      Eliazar Gross, PTA  12/09/23, 10:13 AM

## 2023-12-09 NOTE — TOC Progression Note (Signed)
 Transition of Care [facility]) - Progression Note    Patient Details  Name: Nicholas Gross MRN: [id number] Date of Birth: 1927/12/24  Transition of Care [facility]) CM/SW Contact  Alexandra Ice, RN Phone Number: 12/09/2023, 8:38 AM  Clinical Narrative:     Benjaman Branch pending MD signature        Expected Discharge Plan and Services                                               Social Determinants of Health (SDOH) Interventions SDOH Screenings   Food Insecurity: No Food Insecurity (12/06/2023)  Housing: Patient Unable To Answer (12/06/2023)  Transportation Needs: Patient Unable To Answer (12/06/2023)  Utilities: Patient Unable To Answer (12/06/2023)  Alcohol  Screen: Low Risk  (05/13/2021)  Depression (PHQ2-9): Low Risk  (07/06/2023)  Recent Concern: Depression (PHQ2-9) - Medium Risk (06/01/2023)  Financial Resource Strain: Low Risk  (04/20/2023)   Received from [facility] Care  Physical Activity: Unknown (05/13/2021)  Social Connections: Patient Unable To Answer (12/06/2023)  Recent Concern: Social Connections - Socially Isolated (10/29/2023)  Stress: No Stress Concern Present (05/13/2021)  Tobacco Use: Medium Risk (12/07/2023)    Readmission Risk Interventions     No data to display

## 2023-12-09 NOTE — Progress Notes (Signed)
  PROGRESS NOTE    Nicholas Gross  ZOX:[id number] DOB: 1927-10-07 DOA: 12/06/2023 PCP: Terre Ferri P, DO  [id number]  LOS: 3 days   Brief hospital course:   Assessment & Plan: Nicholas Gross is a 89 year old male with history of hypertension, hyperlipidemia, prior fall status post left hip hemiarthroplasty who presents to the ED for chief concerns of a unwitnessed fall from [employer].    Patient was found to have a right intertrochanteric femur fracture.  * Right intertrochanteric femoral fracture  --underwent IM nail fixation 6/16 --Pain control per orders --Bowel regimen --PT/OT   Multiple falls --Fall precautions   Hypokalemia --monitor and supplement PRN   Rash - chest and abdomen - present before admission per family. --Sees dermatology - follow up outpatient --PRN Benadryl  --Monitor   Dyslipidemia --cont statin   Leukocytosis Suspect reactive in setting of a femoral fracture.  No fevers or s/sx's of infection. Resolved 15.6 >> 8.9 >> 10.6 >> 8.4 --monitor  HTN --cont Lisinopril     DVT prophylaxis: Lovenox  SQ Code Status: DNR  Family Communication:  Level of care: Telemetry Medical Dispo:   The patient is from: SNF rehab Anticipated d/c is to: SNF rehab Anticipated d/c date is: whenever bed available   Subjective and Interval History:  No new event.   Objective: Vitals:   12/08/23 2036 12/09/23 0441 12/09/23 0924 12/09/23 1635  BP: 127/66 (!) 163/69 (!) 177/85 (!) 141/70  Pulse: 64 66 77 71  Resp: 18 18 16 16   Temp: (!) 97.4 F (36.3 C) (!) 97.5 F (36.4 C) 98.4 F (36.9 C) 98.6 F (37 C)  TempSrc:   Oral Oral  SpO2: 100% 100% 99% 98%  Weight:      Height:        Intake/Output Summary (Last 24 hours) at 12/09/2023 2059 Last data filed at 12/09/2023 1600 Gross per 24 hour  Intake 480 ml  Output 1800 ml  Net -1320 ml   Filed Weights   12/06/23 1011  Weight: 70.7 kg    Examination:   Constitutional: NAD CV: No  cyanosis.   RESP: normal respiratory effort, on RA Extremities: TED hose on BLE SKIN: warm, dry   Data Reviewed: I have personally reviewed labs and imaging studies  Time spent: 35 minutes  Garrison Kanner, MD [facility] If 7PM-7AM, please contact night-coverage 12/09/2023, 8:59 PM

## 2023-12-09 NOTE — Care Management Important Message (Signed)
 Important Message  Patient Details  Name: Nicholas Gross MRN: [id number] Date of Birth: Jun 09, 1928   Important Message Given:  Yes - Medicare IM     Donnalynn Wheeless W, CMA 12/09/2023, 11:10 AM

## 2023-12-10 DIAGNOSIS — S72141A Displaced intertrochanteric fracture of right femur, initial encounter for closed fracture: Secondary | ICD-10-CM | POA: Diagnosis not present

## 2023-12-10 LAB — CBC
HCT: 25.3 % — ABNORMAL LOW (ref 39.0–52.0)
Hemoglobin: 8.5 g/dL — ABNORMAL LOW (ref 13.0–17.0)
MCH: 31.8 pg (ref 26.0–34.0)
MCHC: 33.6 g/dL (ref 30.0–36.0)
MCV: 94.8 fL (ref 80.0–100.0)
Platelets: 196 10*3/uL (ref 150–400)
RBC: 2.67 MIL/uL — ABNORMAL LOW (ref 4.22–5.81)
RDW: 15.9 % — ABNORMAL HIGH (ref 11.5–15.5)
WBC: 7.3 10*3/uL (ref 4.0–10.5)
nRBC: 0 % (ref 0.0–0.2)

## 2023-12-10 LAB — BASIC METABOLIC PANEL WITH GFR
Anion gap: 4 — ABNORMAL LOW (ref 5–15)
BUN: 26 mg/dL — ABNORMAL HIGH (ref 8–23)
CO2: 26 mmol/L (ref 22–32)
Calcium: 7.8 mg/dL — ABNORMAL LOW (ref 8.9–10.3)
Chloride: 109 mmol/L (ref 98–111)
Creatinine, Ser: 0.71 mg/dL (ref 0.61–1.24)
GFR, Estimated: >60 mL/min (ref >60)
Glucose, Bld: 90 mg/dL (ref 70–99)
Potassium: 3.6 mmol/L (ref 3.5–5.1)
Sodium: 139 mmol/L (ref 135–145)

## 2023-12-10 MED ORDER — DIPHENHYDRAMINE HCL 25 MG PO CAPS
25.0000 mg | ORAL_CAPSULE | Freq: Four times a day (QID) | ORAL | Status: DC | PRN
  Administered 2023-12-12 – 2023-12-13 (×3): 25 mg via ORAL
  Filled 2023-12-10 (×3): qty 1

## 2023-12-10 MED ORDER — ENOXAPARIN SODIUM 40 MG/0.4ML IJ SOSY: 40.0000 mg | PREFILLED_SYRINGE | INTRAMUSCULAR | 0 refills | Status: DC

## 2023-12-10 MED ORDER — HYDROCODONE-ACETAMINOPHEN 5-325 MG PO TABS: 1.0000 | ORAL_TABLET | Freq: Four times a day (QID) | ORAL | 0 refills | Status: DC | PRN

## 2023-12-10 NOTE — Plan of Care (Signed)
   Problem: Education: Goal: Knowledge of General Education information will improve Description Including pain rating scale, medication(s)/side effects and non-pharmacologic comfort measures Outcome: Progressing

## 2023-12-10 NOTE — Progress Notes (Signed)
  PROGRESS NOTE    Nicholas PURSEL  ZOX:[id number] DOB: 09-May-1928 DOA: 12/06/2023 PCP: Terre Ferri P, DO  157A/[id number]-AA  LOS: 4 days   Brief hospital course:   Assessment & Plan: Nicholas Gross is a 89 year old male with history of hypertension, hyperlipidemia, prior fall status post left hip hemiarthroplasty who presents to the ED for chief concerns of a unwitnessed fall from [employer].    Patient was found to have a right intertrochanteric femur fracture.  * Right intertrochanteric femoral fracture  --underwent IM nail fixation 6/16 Weight-Bearing as tolerated to right leg  Lovenox  40 mg SQ daily x 14 days at discharge  Follow up with [facility] in 2 weeks  --SNF rehab   Multiple falls --Fall precautions   Hypokalemia --monitor and supplement PRN   Rash - chest and abdomen - present before admission per family. --Sees dermatology - follow up outpatient --Bendryl PRN --cont prednisone  --Monitor   Dyslipidemia --cont statin   Leukocytosis Suspect reactive in setting of a femoral fracture.  No fevers or s/sx's of infection. Resolved 15.6 >> 8.9 >> 10.6 >> 8.4 --monitor  HTN BP varied widely --cont Lisinopril     DVT prophylaxis: Lovenox  SQ Code Status: DNR  Family Communication:  Level of care: Telemetry Medical Dispo:   The patient is from: SNF rehab Anticipated d/c is to: SNF rehab Anticipated d/c date is: whenever bed available   Subjective and Interval History:  Pt denied pain while at rest.   Objective: Vitals:   12/09/23 2106 12/10/23 0445 12/10/23 0823 12/10/23 1549  BP: 138/70 127/70 (!) 171/76 132/65  Pulse: 69 70 69 71  Resp: 16 17 19 16   Temp: 98.4 F (36.9 C) 98.2 F (36.8 C) 98.7 F (37.1 C) 98.2 F (36.8 C)  TempSrc: Oral Oral  Oral  SpO2: 100% 100% 99% 100%  Weight:      Height:        Intake/Output Summary (Last 24 hours) at 12/10/2023 2043 Last data filed at 12/10/2023 1921 Gross per 24 hour  Intake 680 ml  Output  1200 ml  Net -520 ml   Filed Weights   12/06/23 1011  Weight: 70.7 kg    Examination:   Constitutional: NAD, awake HEENT: conjunctivae and lids normal, EOMI CV: No cyanosis.   RESP: normal respiratory effort, on RA   Data Reviewed: I have personally reviewed labs and imaging studies  Time spent: 35 minutes  Garrison Kanner, MD [facility] If 7PM-7AM, please contact night-coverage 12/10/2023, 8:43 PM

## 2023-12-10 NOTE — Progress Notes (Signed)
 Physical Therapy Treatment Patient Details Name: Nicholas Gross MRN: [id number] DOB: [birth year] Today's Date: 12/10/2023   History of Present Illness Pt is a 89 y.o.male admitted with unwitnessed fall, s/p R hip IMN on 12/07/23. PMH significant for frequent falls, R pubic rami fx, HTN, HLD, s/p L hip hemiarthroplasty on 10/29/23, rib fx, CVA with right sided weakness    PT Comments  Pt received in bed, daughter at bedside. Pt participated in R LE AAROM prior to transitioning to EOB with MaxA and multimodal cues. Sitting EOB ~10+ minutes with CGA and Left lateral support. Pt requesting BSC for BM. MaxA for squat pivot transfer from raised bed. Multiple attempts to squat pivot back to bed, however pt with increased fatigue and difficulty mobilizing towards operative side. Total assist to attain upright stance in Dwana Gist due to R UE weakness and recent L hip surgery as well as multiple other co-morbidities/injuries. Pt struggled to attain upright stance with increased level of exertion. Would recommend dependent type of lift for OOB to avoid over exertion until function/tolerance improves. Pt positioned to comfort in bed with all needs in reach.    If plan is discharge home, recommend the following: Two people to help with bathing/dressing/bathroom;Two people to help with walking and/or transfers;Assistance with cooking/housework;Assist for transportation;Supervision due to cognitive status;Help with stairs or ramp for entrance   Can travel by private vehicle     No  Equipment Recommendations  None recommended by PT    Recommendations for Other Services       Precautions / Restrictions Precautions Precautions: Fall Recall of Precautions/Restrictions: Impaired Restrictions Weight Bearing Restrictions Per Provider Order: Yes RLE Weight Bearing Per Provider Order: Weight bearing as tolerated Other Position/Activity Restrictions: hx of rib fx, R pubic rami fx and R hip arthroplasty      Mobility  Bed Mobility Overal bed mobility: Needs Assistance Bed Mobility: Supine to Sit     Supine to sit: Max assist, HOB elevated Sit to supine: Max assist, Total assist   General bed mobility comments: Pt agreeable to sitting EOB    Transfers Overall transfer level: Needs assistance Equipment used: None Transfers: Sit to/from Stand, Bed to chair/wheelchair/BSC Sit to Stand: Total assist, From elevated surface     Squat pivot transfers: Total assist     General transfer comment: Several attempts to assess safest transfer technique. Pt too weak to use Dwana Gist, may need dependent lift Transfer via Lift Equipment: Stedy  Ambulation/Gait               General Gait Details: unable/unsafe   Stairs             Wheelchair Mobility     Tilt Bed    Modified Rankin (Stroke Patients Only)       Balance Overall balance assessment: Needs assistance Sitting-balance support: Feet supported, Bilateral upper extremity supported Sitting balance-[name]: Fair Sitting balance - Comments: Pt sat EOB supported with pillows on Left side for ~10 minutes with CGA   Standing balance support: Bilateral upper extremity supported, During functional activity, Reliant on assistive device for balance Standing balance-[name]: Zero                              Communication Communication Communication: Impaired Factors Affecting Communication: Difficulty expressing self;Reduced clarity of speech  Cognition Arousal: Alert Behavior During Therapy: Flat affect   PT - Cognitive impairments: Difficult to assess Difficult to assess  due to: Impaired communication                     PT - Cognition Comments:  (Pt able to make needs know if given time to project voice) Following commands: Impaired Following commands impaired: Follows one step commands inconsistently, Follows one step commands with increased time    Cueing Cueing Techniques:  Verbal cues, Gestural cues, Tactile cues  Exercises General Exercises - Lower Extremity Ankle Circles/Pumps: AAROM, Both, 10 reps Heel Slides: AAROM, Right, 10 reps, Supine Hip ABduction/ADduction: AAROM, Right, 10 reps, Supine Other Exercises Other Exercises: Pt and daughter educated on role of PT, benefits of STR, and acute PT goals    General Comments General comments (skin integrity, edema, etc.): R hip dressing intact.      Pertinent Vitals/Pain Pain Assessment Pain Assessment: Faces Faces Pain Scale: Hurts little more Pain Location: R hip Pain Descriptors / Indicators: Aching, Discomfort, Operative site guarding Pain Intervention(s): Limited activity within patient's tolerance    Home Living                          Prior Function            PT Goals (current goals can now be found in the care plan section)      Frequency    7X/week      PT Plan      Co-evaluation              AM-PAC PT 6 Clicks Mobility   Outcome Measure  Help needed turning from your back to your side while in a flat bed without using bedrails?: Total Help needed moving from lying on your back to sitting on the side of a flat bed without using bedrails?: Total Help needed moving to and from a bed to a chair (including a wheelchair)?: Total Help needed standing up from a chair using your arms (e.g., wheelchair or bedside chair)?: Total Help needed to walk in hospital room?: Total Help needed climbing 3-5 steps with a railing? : Total 6 Click Score: 6    End of Session Equipment Utilized During Treatment: Gait belt Activity Tolerance: Patient limited by fatigue;Patient limited by pain Patient left: in chair;with call bell/phone within reach;with bed alarm set;with family/visitor present Nurse Communication: Mobility status;Other (comment) (+BM) PT Visit Diagnosis: Muscle weakness (generalized) (M62.81);Difficulty in walking, not elsewhere classified (R26.2);History of  falling (Z91.81);Repeated falls (R29.6);Pain Pain - Right/Left: Right Pain - part of body: Hip     Time: 1425-1506 PT Time Calculation (min) (ACUTE ONLY): 41 min  Charges:    $Therapeutic Exercise: 8-22 mins $Therapeutic Activity: 23-37 mins PT General Charges $$ ACUTE PT VISIT: 1 Visit                    Melvyn Stagers, PTA  Diona Franklin 12/10/2023, 5:59 PM

## 2023-12-10 NOTE — Progress Notes (Signed)
   Subjective: 3 Days Post-Op Procedure(s) (LRB): FIXATION, FRACTURE, INTERTROCHANTERIC, WITH INTRAMEDULLARY ROD (Right) Patient is a difficult historian. Resting in bed this am. States he is doing well. Plan is to go Skilled nursing facility after hospital stay. 2 BMs  Objective: Vital signs in last 24 hours: Temp:  [98.2 F (36.8 C)-98.7 F (37.1 C)] 98.7 F (37.1 C) (06/19 0823) Pulse Rate:  [69-71] 69 (06/19 0823) Resp:  [16-19] 19 (06/19 0823) BP: (127-171)/(70-76) 171/76 (06/19 0823) SpO2:  [98 %-100 %] 99 % (06/19 0823)  Intake/Output from previous day: 06/18 0701 - 06/19 0700 In: 480 [P.O.:480] Out: 1200 [Urine:1200] Intake/Output this shift: No intake/output data recorded.  Recent Labs    12/07/23 1530 12/08/23 0440 12/09/23 0510 12/10/23 0244  HGB 9.5* 8.4* 8.4* 8.5*   Recent Labs    12/09/23 0510 12/10/23 0244  WBC 8.5 7.3  RBC 2.74* 2.67*  HCT 25.4* 25.3*  PLT 181 196   Recent Labs    12/09/23 0510 12/10/23 0244  NA 139 139  K 3.5 3.6  CL 109 109  CO2 24 26  BUN 29* 26*  CREATININE 0.85 0.71  GLUCOSE 97 90  CALCIUM  7.8* 7.8*   No results for input(s): LABPT, INR in the last 72 hours.   EXAM General - Patient is Alert Extremity - Neurovascular intact Sensation intact distally Intact pulses distally Dorsiflexion/Plantar flexion intact No cellulitis present Compartment soft Mild ecchymosis along right posterior hip Dressing - dressing C/D/I Motor Function - intact, moving foot and toes well on exam.   Past Medical History:  Diagnosis Date   CAD (coronary artery disease)    History of GI bleed 2008   History of tobacco use    17 pack years, quit around 1970   Hyperlipidemia    Hypertension    Overweight     Assessment/Plan:   3 Days Post-Op Procedure(s) (LRB): FIXATION, FRACTURE, INTERTROCHANTERIC, WITH INTRAMEDULLARY ROD (Right) Principal Problem:   Right femoral fracture (HCC) Active Problems:   Hypertension   CAD  (coronary artery disease)   Dyslipidemia   H/O cardiac catheterization   PAD (peripheral artery disease) (HCC)   Senile purpura (HCC)   Multiple falls   Osteoarthritis of right hip   Hypokalemia   Leukocytosis  Estimated body mass index is 23.7 kg/m as calculated from the following:   Height as of this encounter: 5' 8 (1.727 m).   Weight as of this encounter: 70.7 kg. Advance diet Up with therapy Pain controlled Vital signs stable + BM Labs stable, Hgb 8.5, trending up CM to assist with discharge to SNF  Follow up with [facility] in 2 weeks Lovenox  40 mg SQ daily x 14 days at discharge   DVT Prophylaxis - Lovenox , TED hose, and SCDs Weight-Bearing as tolerated to right leg   T. Thomos Flies, PA-C [facility] 12/10/2023, 10:52 AM

## 2023-12-10 NOTE — Progress Notes (Signed)
 OT Cancellation Note  Patient Details Name: Nicholas Gross MRN: [id number] DOB: 01/15/1928   Cancelled Treatment:    Reason Eval/Treat Not Completed: Fatigue/lethargy limiting ability to participate. Discussed with PT, pt fatigued after PT session. OT will re-attempt at later date.     Yoshiharu Brassell L. Melysa Schroyer, OTR/L  12/10/23, 4:01 PM

## 2023-12-11 DIAGNOSIS — S72141A Displaced intertrochanteric fracture of right femur, initial encounter for closed fracture: Secondary | ICD-10-CM | POA: Diagnosis not present

## 2023-12-11 LAB — VITAMIN B12: Vitamin B-12: 434 pg/mL (ref 180–914)

## 2023-12-11 LAB — FOLATE: Folate: 8.8 ng/mL (ref >5.9)

## 2023-12-11 NOTE — Progress Notes (Signed)
 Occupational Therapy Treatment Patient Details Name: Nicholas Gross MRN: [id number] DOB: 1927-11-29 Today's Date: 12/11/2023   History of present illness Pt is a 89 y.o.male admitted with unwitnessed fall, s/p R hip IMN on 12/07/23. PMH significant for frequent falls, R pubic rami fx, HTN, HLD, s/p L hip hemiarthroplasty on 10/29/23, rib fx, CVA with right sided weakness   OT comments  Pt is supine in bed on arrival. Pleasant and agreeable to PT/OT session to maximize pt/therapist safety. OT went in with NT to assist with bed pan use for large BM, pt able to roll to bil sides in bed with Max/TOTAL A.  He groans/grimaces in pain and has noted increased WOB although VSS. Pt performed bed mobility with Max A x2 using chux pads. Pt required CGA for static seated balance and L lateral lean noted with dynamic activity while washing face using LUE requiring cueing to correct. Stedy used for STS from EOB with Max A, then able to stand from stedy with CGA x2 to sit in recliner. Additional STS from recliner requiring Max A x2 and cues. Assist to place RUE onto stedy rail d/t poor ROM. Provided pt with an ensure and he was able to utilize LUE to drink it all, but needed frequent cueing to take smaller, slower sips.  Pt left in recliner with all needs in place and will cont to require skilled acute OT services to maximize his safety and IND to return to PLOF.       If plan is discharge home, recommend the following:  Two people to help with walking and/or transfers;Two people to help with bathing/dressing/bathroom;Direct supervision/assist for medications management;Direct supervision/assist for financial management;Help with stairs or ramp for entrance;Assist for transportation;Supervision due to cognitive status   Equipment Recommendations  Other (comment)    Recommendations for Other Services      Precautions / Restrictions Precautions Precautions: Fall Recall of Precautions/Restrictions:  Impaired Restrictions Weight Bearing Restrictions Per Provider Order: Yes RLE Weight Bearing Per Provider Order: Weight bearing as tolerated Other Position/Activity Restrictions: hx of rib fx, R pubic rami fx and L hip arthroplasty       Mobility Bed Mobility Overal bed mobility: Needs Assistance Bed Mobility: Supine to Sit     Supine to sit: Max assist, +2 for safety/equipment     General bed mobility comments: utilized chux pads to assist with movement, pt intiates movement but still needs Max A x2 to reach EOB and Max A to scoot hips forward on EOB and recliner    Transfers Overall transfer level: Needs assistance Equipment used: Rolling walker (2 wheels) Transfers: Sit to/from Stand Sit to Stand: Max assist, +2 safety/equipment, +2 physical assistance, Via lift equipment, From elevated surface           General transfer comment: pt required Max A x1-2 for STS from elevated bed height to stedy, needed assist to get RUE onto stedy d/t poor reach/functional use; Max A x2 for STS from recliner and CGA x2 from higher surface of stedy; cueing throughout for sequencing Transfer via Lift Equipment: Stedy   Balance Overall balance assessment: Needs assistance Sitting-balance support: Feet supported, Bilateral upper extremity supported Sitting balance-[name]: Fair Sitting balance - Comments: CGA at EOB static, L lateral lean noted with dynamic activity Postural control: Posterior lean, Left lateral lean Standing balance support: Bilateral upper extremity supported, During functional activity, Reliant on assistive device for balance Standing balance-[name]: Zero Standing balance comment: Max A x2 to STS from recliner  ADL either performed or assessed with clinical judgement   ADL Overall ADL's : Needs assistance/impaired     Grooming: Sitting;Wash/dry face;Contact guard assist Grooming Details (indicate cue type and reason): L  lateral lean while seated EOB using L hand to wash face, cues to correct                                    Extremity/Trunk Assessment              Vision       Perception     Praxis     Communication Communication Communication: Impaired Factors Affecting Communication: Difficulty expressing self;Reduced clarity of speech   Cognition Arousal: Alert Behavior During Therapy: Flat affect                                 Following commands: Impaired Following commands impaired: Follows one step commands with increased time      Cueing   Cueing Techniques: Verbal cues, Gestural cues, Tactile cues  Exercises      Shoulder Instructions       General Comments      Pertinent Vitals/ Pain       Pain Assessment Pain Assessment: Faces Faces Pain Scale: Hurts little more Breathing: noisy labored breathing, long periods of hyperventilation, Cheyne-Stokes respirations Negative Vocalization: occasional moan/groan, low speech, negative/disapproving quality Facial Expression: smiling or inexpressive Body Language: tense, distressed pacing, fidgeting Consolability: no need to console PAINAD Score: 4 Pain Location: R hip Pain Descriptors / Indicators: Aching, Discomfort, Operative site guarding Pain Intervention(s): Limited activity within patient's tolerance, Monitored during session, Repositioned  Home Living                                          Prior Functioning/Environment              Frequency  Min 1X/week        Progress Toward Goals  OT Goals(current goals can now be found in the care plan section)  Progress towards OT goals: Progressing toward goals  Acute Rehab OT Goals OT Goal Formulation: Patient unable to participate in goal setting Time For Goal Achievement: 12/22/23  Plan      Co-evaluation    PT/OT/SLP Co-Evaluation/Treatment: Yes Reason for Co-Treatment: Complexity of the patient's  impairments (multi-system involvement);For patient/therapist safety;To address functional/ADL transfers PT goals addressed during session: Mobility/safety with mobility;Strengthening/ROM;Proper use of DME;Balance OT goals addressed during session: ADL's and self-care      AM-PAC OT 6 Clicks Daily Activity     Outcome Measure   Help from another person eating meals?: A Little Help from another person taking care of personal grooming?: A Little Help from another person toileting, which includes using toliet, bedpan, or urinal?: Total Help from another person bathing (including washing, rinsing, drying)?: Total Help from another person to put on and taking off regular upper body clothing?: Total Help from another person to put on and taking off regular lower body clothing?: Total 6 Click Score: 10    End of Session Equipment Utilized During Treatment: Gait belt;Other (comment) (stedy)  OT Visit Diagnosis: History of falling (Z91.81);Muscle weakness (generalized) (M62.81);Other abnormalities of gait and mobility (R26.89);Repeated falls (R29.6)   Activity Tolerance Patient tolerated treatment  well   Patient Left with call bell/phone within reach;in chair;with chair alarm set;with family/visitor present   Nurse Communication Mobility status        Time: 1610-9604 OT Time Calculation (min): 30 min  Charges: OT General Charges $OT Visit: 1 Visit OT Treatments $Therapeutic Activity: 8-22 mins  Nicholas Gross, OTR/L  12/11/23, 12:51 PM   Nicholas Gross 12/11/2023, 12:48 PM

## 2023-12-11 NOTE — Progress Notes (Signed)
 Nutrition Follow-up  DOCUMENTATION CODES:   Non-severe (moderate) malnutrition in context of chronic illness  INTERVENTION:   -D/c Ensure Plus High Protein po BID, each supplement provides 350 kcal and 20 grams of protein  -Magic cup TID with meals, each supplement provides 290 kcal and 9 grams of protein  -Continue MVI with minerals daily -Feeding assistance with meals  NUTRITION DIAGNOSIS:   Moderate Malnutrition related to chronic illness (CAD) as evidenced by mild fat depletion, moderate fat depletion, moderate muscle depletion, mild muscle depletion.  Ongoing  GOAL:   Patient will meet greater than or equal to 90% of their needs  Progressing   MONITOR:   PO intake, Supplement acceptance, Diet advancement  REASON FOR ASSESSMENT:   Consult Assessment of nutrition requirement/status  ASSESSMENT:   Pt with history of hypertension, hyperlipidemia, prior fall status post left hip hemiarthroplasty who presents for chief concerns of a unwitnessed fall from [employer].  5/8- s/p Left hip bipolar hemiarthroplasty  6/16- s/p Right Hip Intramedullary nail   Reviewed I/O's: +280 ml x 24 hours and -2.7 L since admission  UOP: 400 ml x 24 hours   Pt sleeping soundly at time of visit, but arose to voice and touch. Pt had garbled speech and was difficult to understand. Pt unable to provide history. No family at bedside.   Pt on a dysphagia 1 diet with thin liquids. Pt with good appetite; noted meal completions 50-100%. Pt has been refusing Ensure supplements.   No new wt since last visit.   Medications reviewed and include vitamin B-12, colace, lovenox , prednisone .   Per TOC notes, plan to return to SNF at discharge.   Labs reviewed: CBGS: 108 (inpatient orders for glycemic control are none).    NUTRITION - FOCUSED PHYSICAL EXAM:  Flowsheet Row Most Recent Value  Orbital Region Moderate depletion  Upper Arm Region Moderate depletion  Thoracic and Lumbar Region  Mild depletion  Buccal Region Mild depletion  Temple Region Moderate depletion  Clavicle Bone Region Moderate depletion  Clavicle and Acromion Bone Region Moderate depletion  Scapular Bone Region Moderate depletion  Dorsal Hand Moderate depletion  Patellar Region Moderate depletion  Anterior Thigh Region Moderate depletion  Posterior Calf Region Moderate depletion  Edema (RD Assessment) Mild  Hair Reviewed  Eyes Reviewed  Mouth Reviewed  Skin Reviewed  Nails Reviewed    Diet Order:   Diet Order             DIET - DYS 1 Room service appropriate? Yes; Fluid consistency: Thin  Diet effective now                   EDUCATION NEEDS:   No education needs have been identified at this time  Skin:  Skin Assessment: Skin Integrity Issues: Skin Integrity Issues:: Incisions Incisions: closed rt hip  Last BM:  12/11/23 (type 6)  Height:   Ht Readings from Last 1 Encounters:  12/06/23 5' 8 (1.727 m)    Weight:   Wt Readings from Last 1 Encounters:  12/06/23 70.7 kg    Ideal Body Weight:  70 kg  BMI:  Body mass index is 23.7 kg/m.  Estimated Nutritional Needs:   Kcal:  1900-2100  Protein:  90-105 grams  Fluid:  1.9-2.1 L    Herschel Lords, RD, LDN, CDCES Registered Dietitian III Certified Diabetes Care and Education Specialist If unable to reach this RD, please use RD Inpatient group chat on secure chat between hours of 8am-4 pm daily

## 2023-12-11 NOTE — TOC Transition Note (Signed)
 Transition of Care [facility]) - Discharge Note   Patient Details  Name: Nicholas Gross MRN: [id number] Date of Birth: Oct 12, 1927  [facility]) CM/SW Contact:  Alexandra Ice, RN Phone Number: 12/11/2023, 3:28 PM   Clinical Narrative:     Patient needs STR, resides at [facility]. Auth initiated for [employer], Pending [id number]          Patient Goals and CMS Choice            Discharge Placement                       Discharge Plan and Services Additional resources added to the After Visit Summary for                                       Social Drivers of Health (SDOH) Interventions SDOH Screenings   Food Insecurity: No Food Insecurity (12/06/2023)  Housing: Patient Unable To Answer (12/06/2023)  Transportation Needs: Patient Unable To Answer (12/06/2023)  Utilities: Patient Unable To Answer (12/06/2023)  Alcohol  Screen: Low Risk  (05/13/2021)  Depression (PHQ2-9): Low Risk  (07/06/2023)  Recent Concern: Depression (PHQ2-9) - Medium Risk (06/01/2023)  Financial Resource Strain: Low Risk  (04/20/2023)   Received from [facility]  Physical Activity: Unknown (05/13/2021)  Social Connections: Patient Unable To Answer (12/06/2023)  Recent Concern: Social Connections - Socially Isolated (10/29/2023)  Stress: No Stress Concern Present (05/13/2021)  Tobacco Use: Medium Risk (12/07/2023)     Readmission Risk Interventions     No data to display

## 2023-12-11 NOTE — Progress Notes (Signed)
  PROGRESS NOTE    CALE BETHARD  ZOX:[id number] DOB: Feb 04, 1928 DOA: 12/06/2023 PCP: Terre Ferri P, DO  [id number]/[id number]-AA  LOS: 5 days   Brief hospital course:   Assessment & Plan: Creedon Danielski is a 89 year old male with history of hypertension, hyperlipidemia, prior fall status post left hip hemiarthroplasty who presents to the ED for chief concerns of a unwitnessed fall from [employer] [employer].    Patient was found to have a right intertrochanteric femur fracture.  * Right intertrochanteric femoral fracture  --underwent IM nail fixation 6/16 Weight-Bearing as tolerated to right leg  Lovenox  40 mg SQ daily x 14 days at discharge  Follow up with [facility] in 2 weeks  --SNF rehab   Multiple falls --Fall precautions   Hypokalemia --monitor and supplement PRN   Rash - chest and abdomen - present before admission per family. --Sees dermatology - follow up outpatient --Bendryl PRN --cont prednisone  --Monitor   Dyslipidemia --cont statin   Leukocytosis Suspect reactive in setting of a femoral fracture.  No fevers or s/sx's of infection. Resolved 15.6 >> 8.9 >> 10.6 >> 8.4 --monitor  HTN BP varied widely --cont Lisinopril   Anemia --anemia workup    DVT prophylaxis: Lovenox  SQ Code Status: DNR  Family Communication: sister updated at bedside today Level of care: Telemetry Medical Dispo:   The patient is from: SNF rehab Anticipated d/c is to: SNF rehab Anticipated d/c date is: whenever bed available   Subjective and Interval History:  Pt reported no pain while at rest.   Objective: Vitals:   12/10/23 1945 12/11/23 0416 12/11/23 0749 12/11/23 1553  BP: 128/62 127/66 (!) 162/90 129/62  Pulse: 69 70 71 75  Resp: 16 16 20  (!) 21  Temp: 98.2 F (36.8 C) 98 F (36.7 C) 98.3 F (36.8 C) 98.5 F (36.9 C)  TempSrc: Oral Oral Oral   SpO2: 100% 100% 98% 99%  Weight:      Height:        Intake/Output Summary (Last 24 hours) at 12/11/2023 1840 Last data  filed at 12/11/2023 1553 Gross per 24 hour  Intake 720 ml  Output 400 ml  Net 320 ml   Filed Weights   12/06/23 1011  Weight: 70.7 kg    Examination:   Constitutional: NAD, alert HEENT: conjunctivae and lids normal, EOMI CV: No cyanosis.   RESP: normal respiratory effort, on RA Neuro: II - XII grossly intact.     Data Reviewed: I have personally reviewed labs and imaging studies  Time spent: 35 minutes  Garrison Kanner, MD [facility] If 7PM-7AM, please contact night-coverage 12/11/2023, 6:40 PM

## 2023-12-11 NOTE — Progress Notes (Signed)
   Subjective: 4 Days Post-Op Procedure(s) (LRB): FIXATION, FRACTURE, INTERTROCHANTERIC, WITH INTRAMEDULLARY ROD (Right) Patient is a difficult historian. Resting in bed this am.  Plan is to go Skilled nursing facility after hospital stay.   Objective: Vital signs in last 24 hours: Temp:  [98 F (36.7 C)-98.3 F (36.8 C)] 98.3 F (36.8 C) (06/20 0749) Pulse Rate:  [69-71] 71 (06/20 0749) Resp:  [16-20] 20 (06/20 0749) BP: (127-162)/(62-90) 162/90 (06/20 0749) SpO2:  [98 %-100 %] 98 % (06/20 0749)  Intake/Output from previous day: 06/19 0701 - 06/20 0700 In: 680 [P.O.:680] Out: 400 [Urine:400] Intake/Output this shift: No intake/output data recorded.  Recent Labs    12/09/23 0510 12/10/23 0244  HGB 8.4* 8.5*   Recent Labs    12/09/23 0510 12/10/23 0244  WBC 8.5 7.3  RBC 2.74* 2.67*  HCT 25.4* 25.3*  PLT 181 196   Recent Labs    12/09/23 0510 12/10/23 0244  NA 139 139  K 3.5 3.6  CL 109 109  CO2 24 26  BUN 29* 26*  CREATININE 0.85 0.71  GLUCOSE 97 90  CALCIUM  7.8* 7.8*   No results for input(s): LABPT, INR in the last 72 hours.   EXAM General - Patient is Alert Extremity - Neurovascular intact Sensation intact distally Intact pulses distally Dorsiflexion/Plantar flexion intact No cellulitis present Compartment soft Mild ecchymosis along right posterior hip, stable Dressing - dressing C/D/I Motor Function - intact, moving foot and toes well on exam.   Past Medical History:  Diagnosis Date   CAD (coronary artery disease)    History of GI bleed 2008   History of tobacco use    17 pack years, quit around 1970   Hyperlipidemia    Hypertension    Overweight     Assessment/Plan:   4 Days Post-Op Procedure(s) (LRB): FIXATION, FRACTURE, INTERTROCHANTERIC, WITH INTRAMEDULLARY ROD (Right) Principal Problem:   Right femoral fracture (HCC) Active Problems:   Hypertension   CAD (coronary artery disease)   Dyslipidemia   H/O cardiac  catheterization   PAD (peripheral artery disease) (HCC)   Senile purpura (HCC)   Multiple falls   Osteoarthritis of right hip   Hypokalemia   Leukocytosis  Estimated body mass index is 23.7 kg/m as calculated from the following:   Height as of this encounter: 5' 8 (1.727 m).   Weight as of this encounter: 70.7 kg. Advance diet Up with therapy Pain controlled Vital signs stable + BM Labs stable, Hgb 8.5 stable CM to assist with discharge to SNF  Follow up with [facility] in 2 weeks Lovenox  40 mg SQ daily x 14 days at discharge   DVT Prophylaxis - Lovenox , TED hose, and SCDs Weight-Bearing as tolerated to right leg   T. Thomos Flies, PA-C [facility] 12/11/2023, 8:36 AM

## 2023-12-11 NOTE — Progress Notes (Signed)
 Physical Therapy Treatment Patient Details Name: Nicholas Gross MRN: [id number] DOB: 27-Jul-1927 Today's Date: 12/11/2023   History of Present Illness Pt is a 89 y.o.male admitted with unwitnessed fall, s/p R hip IMN on 12/07/23. PMH significant for frequent falls, R pubic rami fx, HTN, HLD, s/p L hip hemiarthroplasty on 10/29/23, rib fx, CVA with right sided weakness    PT Comments  Pt alert, able to state his name, and wishes with extended time, garbled speech noted. Indicated mild-moderate pain with activity, and did shake his head yes when asked about fatigue. Supine to sit with maxAx2, but pt able to initiate RLE movement and trunk elevation. Able to sit intermittently with CGA, but with fatigue L lateral lean present. Sit <> stand from EOB with stedy, maxA. From stedy height, CGAx2. From recliner with max multimodal cues, maxAx2 with RW stabilization. Pt up in chair with needs in reach. The patient would benefit from further skilled PT intervention to continue to progress towards goals.     If plan is discharge home, recommend the following: Two people to help with bathing/dressing/bathroom;Two people to help with walking and/or transfers;Assistance with cooking/housework;Assist for transportation;Supervision due to cognitive status;Help with stairs or ramp for entrance   Can travel by private vehicle     No  Equipment Recommendations  None recommended by PT    Recommendations for Other Services       Precautions / Restrictions Precautions Precautions: Fall Recall of Precautions/Restrictions: Impaired Restrictions Weight Bearing Restrictions Per Provider Order: Yes RLE Weight Bearing Per Provider Order: Weight bearing as tolerated Other Position/Activity Restrictions: hx of rib fx, R pubic rami fx and R hip arthroplasty     Mobility  Bed Mobility Overal bed mobility: Needs Assistance Bed Mobility: Supine to Sit     Supine to sit: Max assist, +2 for safety/equipment           Transfers Overall transfer level: Needs assistance Equipment used: Rolling walker (2 wheels) (stedy) Transfers: Sit to/from Stand Sit to Stand: Max assist, +2 safety/equipment, +2 physical assistance, Via lift equipment           General transfer comment: maxA to stand from elevated bed surface, with stedy for assistance for BUE support. maxAx2 to stand from recliner with RW. sit <> stand with CGAx2 while in the stedy (very elevated surface)    Ambulation/Gait               General Gait Details: unable/unsafe   Stairs             Wheelchair Mobility     Tilt Bed    Modified Rankin (Stroke Patients Only)       Balance Overall balance assessment: Needs assistance Sitting-balance support: Feet supported, Bilateral upper extremity supported Sitting balance-[name]: Fair Sitting balance - Comments: able to sit with CGA but with fatigue L lateral lean noted Postural control: Posterior lean, Left lateral lean Standing balance support: Bilateral upper extremity supported, During functional activity, Reliant on assistive device for balance Standing balance-[name]: Zero                              Communication Communication Communication: Impaired Factors Affecting Communication: Difficulty expressing self;Reduced clarity of speech  Cognition Arousal: Alert Behavior During Therapy: Flat affect   PT - Cognitive impairments: Difficult to assess Difficult to assess due to: Impaired communication  PT - Cognition Comments: extra to project voice Following commands: Impaired Following commands impaired: Follows one step commands with increased time    Cueing Cueing Techniques: Verbal cues, Gestural cues, Tactile cues  Exercises      General Comments        Pertinent Vitals/Pain Pain Assessment Pain Assessment: Faces Faces Pain Scale: Hurts little more Breathing: noisy labored breathing, long  periods of hyperventilation, Cheyne-Stokes respirations Negative Vocalization: occasional moan/groan, low speech, negative/disapproving quality Facial Expression: smiling or inexpressive Body Language: tense, distressed pacing, fidgeting Consolability: no need to console PAINAD Score: 4 Pain Location: R hip Pain Descriptors / Indicators: Aching, Discomfort, Operative site guarding Pain Intervention(s): Limited activity within patient's tolerance, Monitored during session, Repositioned    Home Living                          Prior Function            PT Goals (current goals can now be found in the care plan section) Progress towards PT goals: Progressing toward goals    Frequency    Min 3X/week      PT Plan      Co-evaluation PT/OT/SLP Co-Evaluation/Treatment: Yes Reason for Co-Treatment: Complexity of the patient's impairments (multi-system involvement);For patient/therapist safety;To address functional/ADL transfers PT goals addressed during session: Mobility/safety with mobility;Strengthening/ROM;Proper use of DME;Balance OT goals addressed during session: ADL's and self-care      AM-PAC PT 6 Clicks Mobility   Outcome Measure  Help needed turning from your back to your side while in a flat bed without using bedrails?: Total Help needed moving from lying on your back to sitting on the side of a flat bed without using bedrails?: Total Help needed moving to and from a bed to a chair (including a wheelchair)?: Total Help needed standing up from a chair using your arms (e.g., wheelchair or bedside chair)?: Total Help needed to walk in hospital room?: Total Help needed climbing 3-5 steps with a railing? : Total 6 Click Score: 6    End of Session Equipment Utilized During Treatment: Gait belt Activity Tolerance: Patient tolerated treatment well Patient left: in chair;with call bell/phone within reach;with family/visitor present;with chair alarm set Nurse  Communication: Mobility status PT Visit Diagnosis: Muscle weakness (generalized) (M62.81);Difficulty in walking, not elsewhere classified (R26.2);History of falling (Z91.81);Repeated falls (R29.6);Pain Pain - Right/Left: Right Pain - part of body: Hip     Time: 1610-9604 PT Time Calculation (min) (ACUTE ONLY): 23 min  Charges:    $Therapeutic Activity: 8-22 mins PT General Charges $$ ACUTE PT VISIT: 1 Visit                     Darien Eden PT, DPT 12:40 PM,12/11/23

## 2023-12-12 DIAGNOSIS — S72141A Displaced intertrochanteric fracture of right femur, initial encounter for closed fracture: Secondary | ICD-10-CM | POA: Diagnosis not present

## 2023-12-12 MED ORDER — ENSURE PLUS HIGH PROTEIN PO LIQD
237.0000 mL | Freq: Two times a day (BID) | ORAL | Status: DC
  Administered 2023-12-12 – 2023-12-14 (×5): 237 mL via ORAL

## 2023-12-12 NOTE — Progress Notes (Signed)
  PROGRESS NOTE    Nicholas Gross  FMW:[id number] DOB: 22-Jul-1927 DOA: 12/06/2023 PCP: Vicci Bouchard P, DO  [id number]  LOS: 6 days   Brief hospital course:   Assessment & Plan: Nicholas Gross is a 89 year old male with history of hypertension, hyperlipidemia, prior fall status post left hip hemiarthroplasty who presents to the ED for chief concerns of a unwitnessed fall from [employer].    Patient was found to have a right intertrochanteric femur fracture.  * Right intertrochanteric femoral fracture  --underwent IM nail fixation 6/16 Weight-Bearing as tolerated to right leg  Lovenox  40 mg SQ daily x 14 days at discharge  Follow up with [facility] in 2 weeks  --SNF rehab   Multiple falls --Fall precautions   Hypokalemia --monitor and supplement PRN   Rash - chest and abdomen - present before admission per family. --Sees dermatology - follow up outpatient --Bendryl PRN --cont prednisone  --Monitor   Dyslipidemia --cont statin   Leukocytosis Suspect reactive in setting of a femoral fracture.  No fevers or s/sx's of infection. Resolved 15.6 >> 8.9 >> 10.6 >> 8.4 --monitor  HTN BP varied widely --cont Lisinopril   Anemia --anemia workup showed no definite deficiencies.    Non-severe (moderate) malnutrition in context of chronic illness  ---supplements per dietician    DVT prophylaxis: Lovenox  SQ Code Status: DNR  Family Communication:  Level of care: Telemetry Medical Dispo:   The patient is from: SNF LTC Anticipated d/c is to: SNF rehab Anticipated d/c date is: whenever bed available   Subjective and Interval History:  No new event.     Objective: Vitals:   12/12/23 0347 12/12/23 0716 12/12/23 0717 12/12/23 1535  BP: (!) 161/76 (!) 170/78 (!) 156/71 (!) 114/58  Pulse: 69 74 70 75  Resp: 18 (!) 21  19  Temp: 98.2 F (36.8 C) 98.2 F (36.8 C)  98.4 F (36.9 C)  TempSrc:  Oral    SpO2: 99% 99%  98%  Weight:      Height:         Intake/Output Summary (Last 24 hours) at 12/12/2023 1751 Last data filed at 12/12/2023 1441 Gross per 24 hour  Intake 677 ml  Output 1000 ml  Net -323 ml   Filed Weights   12/06/23 1011  Weight: 70.7 kg    Examination:   Constitutional: NAD, awake HEENT: conjunctivae and lids normal, EOMI CV: No cyanosis.   RESP: normal respiratory effort, on RA Neuro: II - XII grossly intact.     Data Reviewed: I have personally reviewed labs and imaging studies  Time spent: 35 minutes  Ellouise Haber, MD [facility] If 7PM-7AM, please contact night-coverage 12/12/2023, 5:51 PM

## 2023-12-13 DIAGNOSIS — S72141A Displaced intertrochanteric fracture of right femur, initial encounter for closed fracture: Secondary | ICD-10-CM | POA: Diagnosis not present

## 2023-12-13 MED ORDER — NIACIN 500 MG PO TABS
500.0000 mg | ORAL_TABLET | Freq: Two times a day (BID) | ORAL | Status: DC
  Administered 2023-12-13 – 2023-12-14 (×2): 500 mg via ORAL
  Filled 2023-12-13 (×3): qty 1

## 2023-12-13 MED ORDER — DOXYCYCLINE HYCLATE 100 MG PO TABS
100.0000 mg | ORAL_TABLET | Freq: Two times a day (BID) | ORAL | Status: DC
  Administered 2023-12-13 – 2023-12-14 (×3): 100 mg via ORAL
  Filled 2023-12-13 (×3): qty 1

## 2023-12-13 MED ORDER — CLOBETASOL PROPIONATE 0.05 % EX OINT
TOPICAL_OINTMENT | Freq: Two times a day (BID) | CUTANEOUS | Status: DC | PRN
  Administered 2023-12-14: 1 via TOPICAL
  Filled 2023-12-13: qty 15

## 2023-12-13 NOTE — Progress Notes (Signed)
  PROGRESS NOTE    Nicholas Gross  FMW:[id number] DOB: 1928/02/27 DOA: 12/06/2023 PCP: Vicci Bouchard P, DO  [id number]  LOS: 7 days   Brief hospital course:   Assessment & Plan: Nicholas Gross is a 89 year old male with history of hypertension, hyperlipidemia, prior fall status post left hip hemiarthroplasty who presents to the ED for chief concerns of a unwitnessed fall from [employer].    Patient was found to have a right intertrochanteric femur fracture.  * Right intertrochanteric femoral fracture  --underwent IM nail fixation 6/16 Weight-Bearing as tolerated to right leg  Lovenox  40 mg SQ daily x 14 days at discharge  Follow up with [facility] in 2 weeks  Snf rehab   Multiple falls --Fall precautions   Hypokalemia --monitor and supplement PRN   Rash 2/2 Bullous pemphigoid  - chest and abdomen - present before admission per family. --last saw [facility] derm on 11/26/23 --cont prednisone  20 mg daily --resume doxy 100  mg BID --resume niacinamide 500 mg BID --resume clobetasol  ointment 0.05% BID PRN for itch  --d/c benadryl    Dyslipidemia --cont statin   Leukocytosis Suspect reactive in setting of a femoral fracture.  No fevers or s/sx's of infection. Resolved 15.6 >> 8.9 >> 10.6 >> 8.4 --monitor  HTN BP varied widely --cont Lisinopril   Anemia --anemia workup showed no definite deficiencies.    Non-severe (moderate) malnutrition in context of chronic illness  --supplements per dietician    DVT prophylaxis: Lovenox  SQ Code Status: DNR  Family Communication:  Level of care: Telemetry Medical Dispo:   The patient is from: SNF LTC Anticipated d/c is to: SNF rehab Anticipated d/c date is: whenever bed available   Subjective and Interval History:  RN reported pt scratching often, but benadryl  made pt sleepy.   Objective: Vitals:   12/12/23 2022 12/13/23 0336 12/13/23 0712 12/13/23 1531  BP: 106/60 (!) 160/70 (!) 154/62 127/64  Pulse: 76 66 65 80   Resp: 18 16 20 17   Temp:  98.5 F (36.9 C) 97.8 F (36.6 C) 98.1 F (36.7 C)  TempSrc:  Oral Oral Oral  SpO2: 98% 96% 96% 96%  Weight:      Height:        Intake/Output Summary (Last 24 hours) at 12/13/2023 1550 Last data filed at 12/13/2023 1550 Gross per 24 hour  Intake 957 ml  Output 1600 ml  Net -643 ml   Filed Weights   12/06/23 1011  Weight: 70.7 kg    Examination:   Constitutional: NAD CV: No cyanosis.   RESP: normal respiratory effort, on RA SKIN: warm, dry   Data Reviewed: I have personally reviewed labs and imaging studies  Time spent: 35 minutes  Ellouise Haber, MD [facility] If 7PM-7AM, please contact night-coverage 12/13/2023, 3:50 PM

## 2023-12-13 NOTE — Progress Notes (Signed)
 Physical Therapy Treatment Patient Details Name: Nicholas Gross MRN: [id number] DOB: 1927-12-10 Today's Date: 12/13/2023   History of Present Illness Pt is a 89 y.o.male admitted with unwitnessed fall, s/p R hip IMN on 12/07/23. PMH significant for frequent falls, R pubic rami fx, HTN, HLD, s/p L hip hemiarthroplasty on 10/29/23, rib fx, CVA with right sided weakness    PT Comments  Pt lightly sleeping upon arrival.  BLE PROM 2 x 10 in supine.  Repositioned on L side with pillow support.  He does try to reach for rail to assist but still needs max a to roll to place pillow.  Pt falling asleep during ex.  Does state he is tired and keeps eyes closed for most of session.  Further mobility deferred.   If plan is discharge home, recommend the following: Two people to help with bathing/dressing/bathroom;Two people to help with walking and/or transfers;Assistance with cooking/housework;Assist for transportation;Supervision due to cognitive status;Help with stairs or ramp for entrance   Can travel by private vehicle        Equipment Recommendations  None recommended by PT    Recommendations for Other Services       Precautions / Restrictions Precautions Precautions: Fall Recall of Precautions/Restrictions: Impaired Restrictions Weight Bearing Restrictions Per Provider Order: Yes RLE Weight Bearing Per Provider Order: Weight bearing as tolerated Other Position/Activity Restrictions: hx of rib fx, R pubic rami fx and L hip arthroplasty     Mobility  Bed Mobility Overal bed mobility: Needs Assistance Bed Mobility: Rolling Rolling: Max assist         General bed mobility comments: max a to roll left/right during session.  he does attempt to reach rail to assist but still needs max a to reposition and prop with pillows. Patient Response: Cooperative  Transfers                        Ambulation/Gait                   Stairs             Wheelchair  Mobility     Tilt Bed Tilt Bed Patient Response: Cooperative  Modified Rankin (Stroke Patients Only)       Balance                                            Communication Communication Communication: Impaired Factors Affecting Communication: Difficulty expressing self;Reduced clarity of speech  Cognition Arousal: Lethargic Behavior During Therapy: WFL for tasks assessed/performed   PT - Cognitive impairments: Difficult to assess Difficult to assess due to: Impaired communication                     PT - Cognition Comments: extra to project voice Following commands: Impaired Following commands impaired: Follows one step commands with increased time    Cueing Cueing Techniques: Verbal cues, Gestural cues, Tactile cues  Exercises Other Exercises Other Exercises: BLE PROM 2 x 10 in supine    General Comments        Pertinent Vitals/Pain Pain Assessment Pain Assessment: No/denies pain Pain Location: seems comfortable during ROM and repositioning.  voices no c/o with minimal grimmacing and falling asleep during ROM Pain Intervention(s): Monitored during session, Repositioned    Home Living  Prior Function            PT Goals (current goals can now be found in the care plan section) Progress towards PT goals: Progressing toward goals    Frequency    Min 3X/week      PT Plan      Co-evaluation              AM-PAC PT 6 Clicks Mobility   Outcome Measure  Help needed turning from your back to your side while in a flat bed without using bedrails?: Total Help needed moving from lying on your back to sitting on the side of a flat bed without using bedrails?: Total Help needed moving to and from a bed to a chair (including a wheelchair)?: Total Help needed standing up from a chair using your arms (e.g., wheelchair or bedside chair)?: Total Help needed to walk in hospital room?: Total Help  needed climbing 3-5 steps with a railing? : Total 6 Click Score: 6    End of Session   Activity Tolerance: Patient tolerated treatment well Patient left: in bed;with call bell/phone within reach;with bed alarm set Nurse Communication: Mobility status PT Visit Diagnosis: Muscle weakness (generalized) (M62.81);Difficulty in walking, not elsewhere classified (R26.2);History of falling (Z91.81);Repeated falls (R29.6);Pain Pain - Right/Left: Right Pain - part of body: Hip     Time: 0919-0928 PT Time Calculation (min) (ACUTE ONLY): 9 min  Charges:    $Therapeutic Exercise: 8-22 mins PT General Charges $$ ACUTE PT VISIT: 1 Visit                   Lauraine Gills, PTA 12/13/23, 9:56 AM

## 2023-12-14 DIAGNOSIS — S72141A Displaced intertrochanteric fracture of right femur, initial encounter for closed fracture: Secondary | ICD-10-CM | POA: Diagnosis not present

## 2023-12-14 MED ORDER — DIPHENHYDRAMINE HCL 25 MG PO CAPS: 25.0000 mg | ORAL_CAPSULE | Freq: Every evening | ORAL | Status: DC | PRN

## 2023-12-14 MED ORDER — DIPHENHYDRAMINE HCL 25 MG PO CAPS: 25.0000 mg | ORAL_CAPSULE | Freq: Three times a day (TID) | ORAL | Status: DC | PRN

## 2023-12-14 MED ORDER — NIACINAMIDE 500 MG PO TABS: 500.0000 mg | ORAL_TABLET | Freq: Two times a day (BID) | ORAL | Status: DC

## 2023-12-14 MED ORDER — CETIRIZINE HCL 10 MG PO TABS: 10.0000 mg | ORAL_TABLET | Freq: Every day | ORAL | Status: DC

## 2023-12-14 MED ORDER — ENOXAPARIN SODIUM 40 MG/0.4ML IJ SOSY: 40.0000 mg | PREFILLED_SYRINGE | INTRAMUSCULAR | Status: AC

## 2023-12-14 MED ORDER — DIPHENHYDRAMINE HCL 25 MG PO CAPS
25.0000 mg | ORAL_CAPSULE | Freq: Four times a day (QID) | ORAL | Status: AC | PRN
  Administered 2023-12-14: 25 mg via ORAL
  Filled 2023-12-14: qty 1

## 2023-12-14 MED ORDER — NIACIN 500 MG PO TABS: 500.0000 mg | ORAL_TABLET | Freq: Two times a day (BID) | ORAL | Status: DC

## 2023-12-14 MED ORDER — ENSURE PLUS HIGH PROTEIN PO LIQD: 237.0000 mL | Freq: Two times a day (BID) | ORAL | Status: DC

## 2023-12-14 MED ORDER — LORATADINE 10 MG PO TABS
10.0000 mg | ORAL_TABLET | Freq: Every day | ORAL | Status: DC
  Administered 2023-12-14: 10 mg via ORAL
  Filled 2023-12-14: qty 1

## 2023-12-14 NOTE — TOC Transition Note (Signed)
 Transition of Care [facility]) - Discharge Note   Patient Details  Name: Nicholas Gross MRN: [id number] Date of Birth: 1928-04-13  [facility]) CM/SW Contact:  Quintella Suzen Jansky, RN Phone Number: 12/14/2023, 1:32 PM   Clinical Narrative:     Patient to discharge today, [facility]. Patient agreeable to return. Discharge summary and orders sent to facility via HUB. Patient going to room 303, nurse to call report to [phone]. TOC spoke with Laurel at [facility] on schedule for pick up. EMS packet printed to nurse station. Notified bedside nurse and MD.  Final next level of care: Skilled Nursing Facility Barriers to Discharge: Barriers Resolved   Patient Goals and CMS Choice   CMS Medicare.gov Compare Post Acute Care list provided to:: Patient Choice offered to / list presented to : Patient      Discharge Placement              Patient chooses bed at: [facility]   Name of family member notified: Almarie Patient and family notified of of transfer: 12/14/23  Discharge Plan and Services Additional resources added to the After Visit Summary for                    DME Agency: NA       HH Arranged: NA          Social Drivers of Health (SDOH) Interventions SDOH Screenings   Food Insecurity: No Food Insecurity (12/06/2023)  Housing: Patient Unable To Answer (12/06/2023)  Transportation Needs: Patient Unable To Answer (12/06/2023)  Utilities: Patient Unable To Answer (12/06/2023)  Alcohol  Screen: Low Risk  (05/13/2021)  Depression (PHQ2-9): Low Risk  (07/06/2023)  Recent Concern: Depression (PHQ2-9) - Medium Risk (06/01/2023)  Financial Resource Strain: Low Risk  (04/20/2023)   Received from [facility]  Physical Activity: Unknown (05/13/2021)  Social Connections: Patient Unable To Answer (12/06/2023)  Recent Concern: Social Connections - Socially Isolated (10/29/2023)  Stress: No Stress Concern Present (05/13/2021)  Tobacco  Use: Medium Risk (12/07/2023)     Readmission Risk Interventions     No data to display

## 2023-12-14 NOTE — TOC Progression Note (Signed)
 Transition of Care [facility]) - Progression Note    Patient Details  Name: Nicholas Gross MRN: [id number] Date of Birth: 07-Mar-1928  Transition [facility]) CM/SW Contact  Quintella Suzen Jansky, RN Phone Number: 12/14/2023, 10:31 AM  Clinical Narrative:     Patient received shara approval for [employer], Approved PlanAuthID: [id number] Dates: 6/21-6/24/25 Next Review Date: 12/15/23        Expected Discharge Plan and Services                                               Social Determinants of Health (SDOH) Interventions SDOH Screenings   Food Insecurity: No Food Insecurity (12/06/2023)  Housing: Patient Unable To Answer (12/06/2023)  Transportation Needs: Patient Unable To Answer (12/06/2023)  Utilities: Patient Unable To Answer (12/06/2023)  Alcohol  Screen: Low Risk  (05/13/2021)  Depression (PHQ2-9): Low Risk  (07/06/2023)  Recent Concern: Depression (PHQ2-9) - Medium Risk (06/01/2023)  Financial Resource Strain: Low Risk  (04/20/2023)   Received from [facility]  Physical Activity: Unknown (05/13/2021)  Social Connections: Patient Unable To Answer (12/06/2023)  Recent Concern: Social Connections - Socially Isolated (10/29/2023)  Stress: No Stress Concern Present (05/13/2021)  Tobacco Use: Medium Risk (12/07/2023)    Readmission Risk Interventions     No data to display

## 2023-12-14 NOTE — Discharge Summary (Signed)
 Physician Discharge Summary   Nicholas Gross  male DOB: [birth year]  FMW:[id number]  PCP: Nicholas [name], DO  Admit date: 12/06/2023 Discharge date: 12/14/2023  Admitted From: SNF LTC Disposition:  SNF rehab CODE STATUS: DNR  Discharge Instructions     No wound care   Complete by: As directed       Hospital Course:  For full details, please see H&P, progress notes, consult notes and ancillary notes.  Briefly,  Nicholas Gross is a 89 year old male with history of hypertension, prior fall status post left hip hemiarthroplasty on 10/29/23 who presented to the ED for chief concerns of a unwitnessed fall from [employer].    Patient was found to have a right intertrochanteric femur fracture.   * Right intertrochanteric femoral fracture  S/p IM nail fixation 12/07/23 Weight-Bearing as tolerated to right leg  Lovenox  40 mg SQ daily x 14 days at discharge  Follow up with [facility] in 2 weeks  Back to SNF LTC as rehab   Multiple falls --Fall precautions   Hypokalemia --monitored and supplemented PRN   Rash 2/2 Bullous pemphigoid  - chest and abdomen - present before admission per family. --last saw [facility] derm on 11/26/23 who prescribed prednisone  20 mg daily, doxy 100  mg BID, niacinamide 500 mg BID, and clobetasol  ointment 0.05% BID PRN for itch.  Continue above regimen. --Pt became very sleepy with PRN benadryl , therefore, it is ordered only at night PRN.  Dyslipidemia --cont statin   Leukocytosis Suspect reactive in setting of a femoral fracture.  No fevers or s/sx's of infection.   HTN BP varied widely --cont home Lisinopril    Anemia --anemia workup showed no definite deficiencies.     Non-severe (moderate) malnutrition in context of chronic illness  --supplements per dietician   Discharge Diagnoses:  Principal Problem:   Right femoral fracture (HCC) Active Problems:   Multiple falls   Hypokalemia   Dyslipidemia   Hypertension   CAD (coronary artery  disease)   H/O cardiac catheterization   PAD (peripheral artery disease) (HCC)   Senile purpura (HCC)   Osteoarthritis of right hip   Leukocytosis   30 Day Unplanned Readmission Risk Score    Flowsheet Row ED to Hosp-Admission (Current) from 12/06/2023 in [facility] (1A)  30 Day Unplanned Readmission Risk Score (%) 19.72 Filed at 12/14/2023 1200    This score is the patient's risk of an unplanned readmission within 30 days of being discharged (0 -100%). The score is based on dignosis, age, lab data, medications, orders, and past utilization.   Low:  0-14.9   Medium: 15-21.9   High: 22-29.9   Extreme: 30 and above         Discharge Instructions:  Allergies as of 12/14/2023       Reactions   Other    Patient states that he is allergic to something that they place in the IV before they work on you   Simvastatin  Other (See Comments)   Myalgia        Medication List     STOP taking these medications    potassium chloride  10 MEQ CR capsule Commonly known as: MICRO-K        TAKE these medications    acetaminophen  325 MG tablet Commonly known as: Tylenol  Take 2 tablets (650 mg total) by mouth every 4 (four) hours as needed for moderate pain (pain score 4-6).   aspirin  EC 81 MG tablet Take 81 mg by mouth 2 (  two) times daily. Swallow whole.   calcium  carbonate 600 MG tablet Commonly known as: OS-CAL Take 600 mg by mouth daily.   Calmoseptine 0.44-20.6 % Oint Generic drug: Menthol-Zinc  Oxide Apply 1 Application topically in the morning, at noon, and at bedtime.   cetirizine 10 MG tablet Commonly known as: ZyrTEC Allergy Take 1 tablet (10 mg total) by mouth daily. Home med.   clobetasol  ointment 0.05 % Commonly known as: TEMOVATE  Apply the medication twice daily to stubborn areas of the skin until smooth. Then stop and re-start as the skin changes come back.   cyanocobalamin  250 MCG tablet Commonly known as: VITAMIN B12 Take  by mouth.   diphenhydrAMINE  25 mg capsule Commonly known as: BENADRYL  Take 1 capsule (25 mg total) by mouth at bedtime as needed for itching.   doxycycline  100 MG tablet Commonly known as: VIBRA -TABS Take 100 mg by mouth 2 (two) times daily.   enoxaparin  40 MG/0.4ML injection Commonly known as: LOVENOX  Inject 0.4 mLs (40 mg total) into the skin daily for 14 days.   feeding supplement Liqd Take 237 mLs by mouth 2 (two) times daily between meals.   HYDROcodone -acetaminophen  5-325 MG tablet Commonly known as: NORCO/VICODIN Take 1 tablet by mouth every 6 (six) hours as needed for moderate pain (pain score 4-6) or severe pain (pain score 7-10).   lisinopril  5 MG tablet Commonly known as: ZESTRIL  Take 1 tablet (5 mg total) by mouth daily.   niacinamide 500 MG tablet Take 1 tablet (500 mg total) by mouth 2 (two) times daily with a meal.   predniSONE  10 MG tablet Commonly known as: DELTASONE  Take 20 mg by mouth daily with breakfast.   PRESERVISION AREDS PO Take 1 tablet by mouth 2 (two) times daily.   Refresh Tears 0.5 % Soln Generic drug: carboxymethylcellulose Apply 1 drop to eye 3 (three) times daily as needed.   rosuvastatin  10 MG tablet Commonly known as: CRESTOR  Take 1 tablet (10 mg total) by mouth daily.   tamsulosin  0.4 MG Caps capsule Commonly known as: FLOMAX  Take 0.4 mg by mouth daily.         Follow-up Information     Nicholas Debby BROCKS, PA-C Follow up in 2 week(s).   Specialties: Orthopedic Surgery, Emergency Medicine Contact information: [address] [place] [address] [phone]         Nicholas Gross P, DO Follow up.   Specialty: Family Medicine Contact information: [address] [place] [address] [phone]                 Allergies  Allergen Reactions   Other     Patient states that he is allergic to something that they place in the IV before they work on you   Simvastatin  Other (See Comments)    Myalgia       The results of significant diagnostics from this hospitalization (including imaging, microbiology, ancillary and laboratory) are listed below for reference.   Consultations:   Procedures/Studies: DG HIP UNILAT WITH PELVIS 2-3 VIEWS RIGHT Result Date: 12/07/2023 CLINICAL DATA:  ORIF right hip fracture EXAM: DG HIP (WITH OR WITHOUT PELVIS) 2-3V RIGHT COMPARISON:  12/06/2023 FINDINGS: Seven fluoroscopic images are obtained during the performance of the procedure and are provided for interpretation only. Images demonstrate intramedullary rod with proximal dynamic and distal interlocking screw traversing the comminuted intertrochanteric right hip fracture seen previously. Alignment is near anatomic. Left hip hemiarthroplasty is in stable position. Please refer to operative report. Fluoroscopy time:  2 minutes 5 seconds, 20.85 mGy IMPRESSION: 1. ORIF right hip fracture as above. Electronically Signed   By: Ozell Daring M.D.   On: 12/07/2023 18:32   DG C-Arm 1-60 Min-No Report Result Date: 12/07/2023 Fluoroscopy was utilized by the requesting physician.  No radiographic interpretation.   DG C-Arm 1-60 Min-No Report Result Date: 12/07/2023 Fluoroscopy was utilized by the requesting physician.  No radiographic interpretation.   US  Venous Img Upper Uni Right(DVT) Result Date: 12/06/2023 CLINICAL DATA:  Arm swelling and bruising following fall. EXAM: RIGHT UPPER EXTREMITY VENOUS DOPPLER ULTRASOUND TECHNIQUE: Gray-scale sonography with graded compression, as well as color Doppler and duplex ultrasound were performed to evaluate the upper extremity deep venous system from the level of the subclavian vein and including the jugular, axillary, basilic, radial, ulnar and upper cephalic vein. Spectral Doppler was utilized to evaluate flow at rest and with distal augmentation maneuvers. COMPARISON:  None Available. FINDINGS: Contralateral Subclavian Vein: Respiratory phasicity is normal and symmetric with the  symptomatic side. No evidence of thrombus. Normal compressibility. Internal Jugular Vein: No evidence of thrombus. Normal compressibility, respiratory phasicity and response to augmentation. Subclavian Vein: No evidence of thrombus. Normal compressibility, respiratory phasicity and response to augmentation. Axillary Vein: No evidence of thrombus. Normal compressibility, respiratory phasicity and response to augmentation. Cephalic Vein: No evidence of thrombus. Normal compressibility, respiratory phasicity and response to augmentation. Basilic Vein: No evidence of thrombus. Normal compressibility, respiratory phasicity and response to augmentation. Brachial Veins: No evidence of thrombus. Normal compressibility, respiratory phasicity and response to augmentation. Radial Veins: No evidence of thrombus. Normal compressibility, respiratory phasicity and response to augmentation. Ulnar Veins: No evidence of thrombus. Normal compressibility, respiratory phasicity and response to augmentation. Venous Reflux:  None visualized. Other Findings:  Subcutaneous edema is noted in the right forearm. IMPRESSION: No evidence of DVT within the right upper extremity. Electronically Signed   By: Leita Birmingham M.D.   On: 12/06/2023 15:17   DG Femur Min 2 Views Right Result Date: 12/06/2023 CLINICAL DATA:  Right hip fracture.  Full femur imaging. EXAM: RIGHT FEMUR 2 VIEWS COMPARISON:  Right hip radiographs 12/06/2023, CT right hip 12/06/2023 FINDINGS: There is again diffuse decreased bone mineralization. Redemonstration of right femoral intertrochanteric acute fracture. There is again mild to moderate varus angulation of the fracture. Subacute healing right superior and inferior pubic ramus fractures again seen. No additional acute fracture is seen within the more distal aspect of the femur. Within the limitation of obliquity of the views, the knee joint appears appropriately aligned. No knee joint effusion is seen. Moderate to  high-grade atherosclerotic calcifications. IMPRESSION: 1. Redemonstration of right femoral intertrochanteric acute fracture with mild to moderate varus angulation. 2. Subacute healing right superior and inferior pubic ramus fractures. Electronically Signed   By: Tanda Lyons M.D.   On: 12/06/2023 13:54   CT Hip Right Wo Contrast Result Date: 12/06/2023 CLINICAL DATA:  Fracture.  Fall. EXAM: CT OF THE RIGHT HIP WITHOUT CONTRAST TECHNIQUE: Multidetector CT imaging of the right hip was performed according to the standard protocol. Multiplanar CT image reconstructions were also generated. RADIATION DOSE REDUCTION: This exam was performed according to the departmental dose-optimization program which includes automated exposure control, adjustment of the mA and/or kV according to patient size and/or use of iterative reconstruction technique. COMPARISON:  06/17/2022. FINDINGS: Comminuted intertrochanteric fracture of the right femoral neck with varus angulation. Fracture fragments displaced by several mm. Fracture identified of the superior pubic ramus, the inferior acetabular extending to the ischium. Pubic  rami fractures demonstrate evidence of callus formation indicating healing. IMPRESSION: 1. Healing fractures of the inferior and superior pubic rami extending to the inferior acetabulum on the right. 2. Acute intertrochanteric fracture of the right femoral neck. Electronically Signed   By: Fonda Field M.D.   On: 12/06/2023 13:46   DG Chest Port 1 View Result Date: 12/06/2023 CLINICAL DATA:  Fall. EXAM: PORTABLE CHEST 1 VIEW COMPARISON:  AP chest 10/28/2023 FINDINGS: Cardiac silhouette mediastinal contours are unchanged and within normal limits. Moderate atherosclerotic calcification within the aortic arch. The lungs are clear. No pleural effusion pneumothorax. Mild dextrocurvature of the midthoracic spine with moderate multilevel degenerative disc changes. IMPRESSION: No active disease. Electronically  Signed   By: Tanda Lyons M.D.   On: 12/06/2023 12:12   DG Forearm Right Result Date: 12/06/2023 CLINICAL DATA:  Fall.  Trauma.  Right forearm and hand pain. EXAM: RIGHT HAND - COMPLETE 3+ VIEW; RIGHT FOREARM - 2 VIEW COMPARISON:  None Available. FINDINGS: Right forearm: There is diffuse decreased bone mineralization. No acute fracture is seen within the radius or ulna. Mild medial elbow peripheral degenerative spurring. Soft tissue lucency that may represent a laceration within the distal posterior right upper arm/posterior elbow. There is moderate swelling dorsal and medial to the proximal ulna. -- Right hand: Moderate thumb carpometacarpal joint space narrowing, subchondral sclerosis, subchondral cystic change, and peripheral osteophytosis. Moderate thumb interphalangeal joint space narrowing and peripheral osteophytosis. Mild third and fourth DIP joint space narrowing. Postsurgical changes of amputation of the index finger to the level of the distal aspect of the middle phalanx. No acute fracture or dislocation. IMPRESSION: 1. No acute fracture is seen within the right forearm or hand. 2. Moderate swelling dorsal and medial to the proximal ulna. 3. Moderate thumb carpometacarpal and interphalangeal osteoarthritis. 4. Postsurgical changes of amputation of the index finger to the level of the distal aspect of the middle phalanx. Electronically Signed   By: Tanda Lyons M.D.   On: 12/06/2023 12:12   DG Hand Complete Right Result Date: 12/06/2023 CLINICAL DATA:  Fall.  Trauma.  Right forearm and hand pain. EXAM: RIGHT HAND - COMPLETE 3+ VIEW; RIGHT FOREARM - 2 VIEW COMPARISON:  None Available. FINDINGS: Right forearm: There is diffuse decreased bone mineralization. No acute fracture is seen within the radius or ulna. Mild medial elbow peripheral degenerative spurring. Soft tissue lucency that may represent a laceration within the distal posterior right upper arm/posterior elbow. There is moderate swelling  dorsal and medial to the proximal ulna. -- Right hand: Moderate thumb carpometacarpal joint space narrowing, subchondral sclerosis, subchondral cystic change, and peripheral osteophytosis. Moderate thumb interphalangeal joint space narrowing and peripheral osteophytosis. Mild third and fourth DIP joint space narrowing. Postsurgical changes of amputation of the index finger to the level of the distal aspect of the middle phalanx. No acute fracture or dislocation. IMPRESSION: 1. No acute fracture is seen within the right forearm or hand. 2. Moderate swelling dorsal and medial to the proximal ulna. 3. Moderate thumb carpometacarpal and interphalangeal osteoarthritis. 4. Postsurgical changes of amputation of the index finger to the level of the distal aspect of the middle phalanx. Electronically Signed   By: Tanda Lyons M.D.   On: 12/06/2023 12:12   DG Humerus Right Result Date: 12/06/2023 CLINICAL DATA:  Trauma.  Fall today. EXAM: RIGHT HUMERUS - 2+ VIEW COMPARISON:  None Available. FINDINGS: There is diffuse decreased bone mineralization. Mild acromioclavicular joint space narrowing and peripheral osteophytosis. The acromioclavicular and glenohumeral joint spaces  are normally aligned. Within the limitation of low bone mineralization and overlapping bones at the elbow due to projection, no definite acute fracture is seen. No dislocation. IMPRESSION: 1. No acute fracture is seen. 2. Mild acromioclavicular osteoarthritis. Electronically Signed   By: Tanda Lyons M.D.   On: 12/06/2023 12:07   DG Hip Unilat W or Wo Pelvis 2-3 Views Right Result Date: 12/06/2023 CLINICAL DATA:  Fall.  Trauma.  Bruising and pain to right hip. EXAM: DG HIP (WITH OR WITHOUT PELVIS) 2-3V RIGHT COMPARISON:  Pelvis and right hip radiographs 06/16/2022 and pelvis and left hip radiographs 10/29/2023; CT abdomen and pelvis 09/28/2023 FINDINGS: There is diffuse decreased bone mineralization. Redemonstration of left hip bipolar  hemiarthroplasty. The distal stem is not imaged, however no perihardware lucency is seen to indicate hardware failure or loosening. New right femoral intertrochanteric fracture with approximately 8 mm inferior displacement of the distal fracture component with respect to the proximal fracture component. Moderate superior right femoroacetabular joint space narrowing. Mild cortical thickening irregularity of the right superior and inferior pubic rami appears similar to 10/29/2023. These fractures were seen to be acute on 09/28/2023 CT and now demonstrate partial subacute healing. Mild bilateral sacroiliac joint space narrowing and subchondral sclerosis. Moderate right L4-5 disc space narrowing. IMPRESSION: 1. New right femoral intertrochanteric fracture with approximately 8 mm inferior displacement of the distal fracture component with respect to the proximal fracture component. 2. Partial subacute healing of right superior and inferior pubic rami fractures seen acutely on CT abdomen and pelvis 09/28/2023. 3. Left hip bipolar hemiarthroplasty without evidence of hardware failure or loosening within the visualized portion. Electronically Signed   By: Tanda Lyons M.D.   On: 12/06/2023 12:05   CT HEAD WO CONTRAST ( ) Result Date: 12/06/2023 CLINICAL DATA:  Trauma. EXAM: CT HEAD WITHOUT CONTRAST CT CERVICAL SPINE WITHOUT CONTRAST TECHNIQUE: Multidetector CT imaging of the head and cervical spine was performed following the standard protocol without intravenous contrast. Multiplanar CT image reconstructions of the cervical spine were also generated. RADIATION DOSE REDUCTION: This exam was performed according to the departmental dose-optimization program which includes automated exposure control, adjustment of the mA and/or kV according to patient size and/or use of iterative reconstruction technique. COMPARISON:  10/29/2023. FINDINGS: CT HEAD FINDINGS Brain: There is periventricular white matter decreased  attenuation consistent with small vessel ischemic changes. Ventricles, sulci and cisterns are prominent consistent with age related involutional changes. No acute intracranial hemorrhage, mass effect or shift. No hydrocephalus. Vascular: No hyperdense vessel or unexpected calcification. Skull: Normal. Negative for fracture or focal lesion. Sinuses/Orbits: No acute finding. CT CERVICAL SPINE FINDINGS Alignment: Normal. Skull base and vertebrae: Osseous structures are osteopenic. No acute fracture. No primary bone lesion or focal pathologic process. Soft tissues and spinal canal: No prevertebral fluid or swelling. No visible canal hematoma. Disc levels: Disc space narrowing with marginal osteophyte formation C4-5 through C6-7. Osteoarthritis at C1-C2. Upper chest: Negative. IMPRESSION: 1. Atrophy and chronic small vessel ischemic changes. No acute intracranial process identified. 2. Osteopenia and degenerative changes. No acute osseous abnormalities. Electronically Signed   By: Fonda Field M.D.   On: 12/06/2023 10:49   CT Cervical Spine Wo Contrast Result Date: 12/06/2023 CLINICAL DATA:  Trauma. EXAM: CT HEAD WITHOUT CONTRAST CT CERVICAL SPINE WITHOUT CONTRAST TECHNIQUE: Multidetector CT imaging of the head and cervical spine was performed following the standard protocol without intravenous contrast. Multiplanar CT image reconstructions of the cervical spine were also generated. RADIATION DOSE REDUCTION: This exam was performed according  to the departmental dose-optimization program which includes automated exposure control, adjustment of the mA and/or kV according to patient size and/or use of iterative reconstruction technique. COMPARISON:  10/29/2023. FINDINGS: CT HEAD FINDINGS Brain: There is periventricular white matter decreased attenuation consistent with small vessel ischemic changes. Ventricles, sulci and cisterns are prominent consistent with age related involutional changes. No acute intracranial  hemorrhage, mass effect or shift. No hydrocephalus. Vascular: No hyperdense vessel or unexpected calcification. Skull: Normal. Negative for fracture or focal lesion. Sinuses/Orbits: No acute finding. CT CERVICAL SPINE FINDINGS Alignment: Normal. Skull base and vertebrae: Osseous structures are osteopenic. No acute fracture. No primary bone lesion or focal pathologic process. Soft tissues and spinal canal: No prevertebral fluid or swelling. No visible canal hematoma. Disc levels: Disc space narrowing with marginal osteophyte formation C4-5 through C6-7. Osteoarthritis at C1-C2. Upper chest: Negative. IMPRESSION: 1. Atrophy and chronic small vessel ischemic changes. No acute intracranial process identified. 2. Osteopenia and degenerative changes. No acute osseous abnormalities. Electronically Signed   By: Fonda Field M.D.   On: 12/06/2023 10:49      Labs: BNP (last 3 results) No results for input(s): BNP in the last 8760 hours. Basic Metabolic Panel: Recent Labs  Lab 12/08/23 0440 12/09/23 0510 12/10/23 0244  NA 137 139 139  K 3.9 3.5 3.6  CL 108 109 109  CO2 19* 24 26  GLUCOSE 117* 97 90  BUN 23 29* 26*  CREATININE 0.84 0.85 0.71  CALCIUM  7.8* 7.8* 7.8*   Liver Function Tests: No results for input(s): AST, ALT, ALKPHOS, BILITOT, PROT, ALBUMIN in the last 168 hours. No results for input(s): LIPASE, AMYLASE in the last 168 hours. No results for input(s): AMMONIA in the last 168 hours. CBC: Recent Labs  Lab 12/07/23 1530 12/08/23 0440 12/09/23 0510 12/10/23 0244  WBC 10.6* 8.4 8.5 7.3  NEUTROABS 9.2*  --   --   --   HGB 9.5* 8.4* 8.4* 8.5*  HCT 27.9* 25.5* 25.4* 25.3*  MCV 92.7 93.4 92.7 94.8  PLT 163 161 181 196   Cardiac Enzymes: No results for input(s): CKTOTAL, CKMB, CKMBINDEX, TROPONINI in the last 168 hours. BNP: Invalid input(s): POCBNP CBG: No results for input(s): GLUCAP in the last 168 hours. D-Dimer No results for input(s):  DDIMER in the last 72 hours. Hgb A1c No results for input(s): HGBA1C in the last 72 hours. Lipid Profile No results for input(s): CHOL, HDL, LDLCALC, TRIG, CHOLHDL, LDLDIRECT in the last 72 hours. Thyroid  function studies No results for input(s): TSH, T4TOTAL, T3FREE, THYROIDAB in the last 72 hours.  Invalid input(s): FREET3 Anemia work up Recent Labs    12/11/23 1929  VITAMINB12 434   Urinalysis    Component Value Date/Time   COLORURINE YELLOW (A) 12/08/2023 0415   APPEARANCEUR CLEAR (A) 12/08/2023 0415   APPEARANCEUR Clear 02/02/2023 1632   LABSPEC 1.023 12/08/2023 0415   PHURINE 6.0 12/08/2023 0415   GLUCOSEU NEGATIVE 12/08/2023 0415   HGBUR NEGATIVE 12/08/2023 0415   BILIRUBINUR NEGATIVE 12/08/2023 0415   BILIRUBINUR Negative 02/02/2023 1632   KETONESUR 5 (A) 12/08/2023 0415   PROTEINUR NEGATIVE 12/08/2023 0415   NITRITE POSITIVE (A) 12/08/2023 0415   LEUKOCYTESUR MODERATE (A) 12/08/2023 0415   Sepsis Labs Recent Labs  Lab 12/07/23 1530 12/08/23 0440 12/09/23 0510 12/10/23 0244  WBC 10.6* 8.4 8.5 7.3   Microbiology Recent Results (from the past 240 hours)  MRSA Next Gen by PCR, Nasal     Status: None   Collection Time: 12/07/23  11:11 AM   Specimen: Nasal Mucosa; Nasal Swab  Result Value Ref Range Status   MRSA by PCR Next Gen NOT DETECTED NOT DETECTED Final    Comment: (NOTE) The GeneXpert MRSA Assay (FDA approved for NASAL specimens only), is one component of a comprehensive MRSA colonization surveillance program. It is not intended to diagnose MRSA infection nor to guide or monitor treatment for MRSA infections. Test performance is not FDA approved in patients less than 4 years old. Performed at [facility], [address] [place], [place] [address]      Total time spend on discharging this patient, including the last patient exam, discussing the hospital stay, instructions for ongoing care as it relates to all  pertinent caregivers, as well as preparing the medical discharge records, prescriptions, and/or referrals as applicable, is 35 minutes.    Ellouise Haber, MD  [facility] 12/14/2023, 1:13 PM

## 2023-12-18 ENCOUNTER — Inpatient Hospital Stay: Admitting: Nurse Practitioner

## 2023-12-23 ENCOUNTER — Inpatient Hospital Stay: Admitting: Nurse Practitioner

## 2024-01-06 ENCOUNTER — Other Ambulatory Visit: Payer: Self-pay | Admitting: Family Medicine

## 2024-01-08 NOTE — Telephone Encounter (Signed)
 Requested medications are due for refill today.  yes  Requested medications are on the active medications list.  yes  Last refill. 07/06/2023 #90 0 rf  Future visit scheduled.   no  Notes to clinic.  Labs are expired.    Requested Prescriptions  Pending Prescriptions Disp Refills   rosuvastatin  (CRESTOR ) 10 MG tablet [Pharmacy Med Name: ROSUVASTATIN  CALCIUM  10 MG TAB] 90 tablet 0    Sig: TAKE 1 TABLET BY MOUTH ONCE DAILY     Cardiovascular:  Antilipid - Statins 2 Failed - 01/08/2024 10:10 AM      Failed - Valid encounter within last 12 months    Recent Outpatient Visits   None            Failed - Lipid Panel in normal range within the last 12 months    Cholesterol, Total  Date Value Ref Range Status  11/08/2021 107 100 - 199 mg/dL Final   Cholesterol  Date Value Ref Range Status  12/13/2021 105 0 - 200 mg/dL Final   Cholesterol Piccolo, Waived  Date Value Ref Range Status  02/13/2016 123 <200 mg/dL Final    Comment:                            Desirable                <200                         Borderline High      200- 239                         High                     >239    LDL Chol Calc (NIH)  Date Value Ref Range Status  11/08/2021 41 0 - 99 mg/dL Final   LDL Cholesterol  Date Value Ref Range Status  12/13/2021 41 0 - 99 mg/dL Final    Comment:           Total Cholesterol/HDL:CHD Risk Coronary Heart Disease Risk Table                     Men   Women  1/2 Average Risk   3.4   3.3  Average Risk       5.0   4.4  2 X Average Risk   9.6   7.1  3 X Average Risk  23.4   11.0        Use the calculated Patient Ratio above and the CHD Risk Table to determine the patient's CHD Risk.        ATP III CLASSIFICATION (LDL):  <100     mg/dL   Optimal  899-870  mg/dL   Near or Above                    Optimal  130-159  mg/dL   Borderline  839-810  mg/dL   High  >809     mg/dL   Very High Performed at [facility], [address]  [place], [place] [address]    HDL  Date Value Ref Range Status  12/13/2021 54 >40 mg/dL Final  94/80/7976 51 >60 mg/dL Final   Triglycerides  Date Value Ref Range Status  12/13/2021 51 <150 mg/dL Final  Triglycerides Piccolo,Waived  Date Value Ref Range Status  02/13/2016 114 <150 mg/dL Final    Comment:                            Normal                   <150                         Borderline High     150 - 199                         High                200 - 499                         Very High                >499          Passed - Cr in normal range and within 360 days    Creatinine, Ser  Date Value Ref Range Status  12/10/2023 0.71 0.61 - 1.24 mg/dL Final         Passed - Patient is not pregnant

## 2024-05-09 ENCOUNTER — Telehealth: Payer: Self-pay

## 2024-05-09 NOTE — Telephone Encounter (Signed)
 Facility requesting documentation of cancellation.

## 2024-05-30 ENCOUNTER — Emergency Department

## 2024-05-30 ENCOUNTER — Inpatient Hospital Stay
Admission: EM | Admit: 2024-05-30 | Discharge: 2024-06-23 | DRG: 871 | Disposition: E | Source: Skilled Nursing Facility | Attending: Internal Medicine | Admitting: Internal Medicine

## 2024-05-30 ENCOUNTER — Inpatient Hospital Stay

## 2024-05-30 DIAGNOSIS — J188 Other pneumonia, unspecified organism: Secondary | ICD-10-CM | POA: Diagnosis not present

## 2024-05-30 DIAGNOSIS — G928 Other toxic encephalopathy: Secondary | ICD-10-CM | POA: Diagnosis present

## 2024-05-30 DIAGNOSIS — G9341 Metabolic encephalopathy: Secondary | ICD-10-CM | POA: Diagnosis present

## 2024-05-30 DIAGNOSIS — Z515 Encounter for palliative care: Secondary | ICD-10-CM | POA: Diagnosis not present

## 2024-05-30 DIAGNOSIS — E870 Hyperosmolality and hypernatremia: Secondary | ICD-10-CM | POA: Diagnosis present

## 2024-05-30 DIAGNOSIS — J690 Pneumonitis due to inhalation of food and vomit: Secondary | ICD-10-CM | POA: Diagnosis not present

## 2024-05-30 DIAGNOSIS — L299 Pruritus, unspecified: Secondary | ICD-10-CM | POA: Diagnosis not present

## 2024-05-30 DIAGNOSIS — E43 Unspecified severe protein-calorie malnutrition: Secondary | ICD-10-CM | POA: Diagnosis present

## 2024-05-30 DIAGNOSIS — R000 Tachycardia, unspecified: Secondary | ICD-10-CM | POA: Diagnosis not present

## 2024-05-30 DIAGNOSIS — E860 Dehydration: Secondary | ICD-10-CM | POA: Diagnosis present

## 2024-05-30 DIAGNOSIS — D696 Thrombocytopenia, unspecified: Secondary | ICD-10-CM | POA: Insufficient documentation

## 2024-05-30 DIAGNOSIS — R7989 Other specified abnormal findings of blood chemistry: Secondary | ICD-10-CM | POA: Diagnosis not present

## 2024-05-30 DIAGNOSIS — E871 Hypo-osmolality and hyponatremia: Secondary | ICD-10-CM | POA: Diagnosis present

## 2024-05-30 DIAGNOSIS — Z7952 Long term (current) use of systemic steroids: Secondary | ICD-10-CM | POA: Diagnosis not present

## 2024-05-30 DIAGNOSIS — L120 Bullous pemphigoid: Secondary | ICD-10-CM | POA: Diagnosis present

## 2024-05-30 DIAGNOSIS — E872 Acidosis, unspecified: Secondary | ICD-10-CM | POA: Diagnosis present

## 2024-05-30 DIAGNOSIS — J9601 Acute respiratory failure with hypoxia: Secondary | ICD-10-CM

## 2024-05-30 DIAGNOSIS — Z681 Body mass index (BMI) 19 or less, adult: Secondary | ICD-10-CM | POA: Diagnosis not present

## 2024-05-30 DIAGNOSIS — I959 Hypotension, unspecified: Secondary | ICD-10-CM | POA: Diagnosis not present

## 2024-05-30 DIAGNOSIS — I69351 Hemiplegia and hemiparesis following cerebral infarction affecting right dominant side: Secondary | ICD-10-CM | POA: Diagnosis not present

## 2024-05-30 DIAGNOSIS — D509 Iron deficiency anemia, unspecified: Secondary | ICD-10-CM | POA: Diagnosis present

## 2024-05-30 DIAGNOSIS — J15212 Pneumonia due to Methicillin resistant Staphylococcus aureus: Secondary | ICD-10-CM | POA: Diagnosis present

## 2024-05-30 DIAGNOSIS — E8729 Other acidosis: Secondary | ICD-10-CM | POA: Diagnosis not present

## 2024-05-30 DIAGNOSIS — Z79899 Other long term (current) drug therapy: Secondary | ICD-10-CM | POA: Diagnosis not present

## 2024-05-30 DIAGNOSIS — B9789 Other viral agents as the cause of diseases classified elsewhere: Secondary | ICD-10-CM | POA: Diagnosis present

## 2024-05-30 DIAGNOSIS — D508 Other iron deficiency anemias: Secondary | ICD-10-CM | POA: Diagnosis not present

## 2024-05-30 DIAGNOSIS — G9340 Encephalopathy, unspecified: Secondary | ICD-10-CM | POA: Diagnosis not present

## 2024-05-30 DIAGNOSIS — J9691 Respiratory failure, unspecified with hypoxia: Secondary | ICD-10-CM | POA: Diagnosis present

## 2024-05-30 DIAGNOSIS — I10 Essential (primary) hypertension: Secondary | ICD-10-CM | POA: Diagnosis present

## 2024-05-30 DIAGNOSIS — J129 Viral pneumonia, unspecified: Secondary | ICD-10-CM | POA: Diagnosis present

## 2024-05-30 DIAGNOSIS — A419 Sepsis, unspecified organism: Principal | ICD-10-CM

## 2024-05-30 DIAGNOSIS — J189 Pneumonia, unspecified organism: Secondary | ICD-10-CM | POA: Diagnosis not present

## 2024-05-30 DIAGNOSIS — J9690 Respiratory failure, unspecified, unspecified whether with hypoxia or hypercapnia: Secondary | ICD-10-CM | POA: Diagnosis not present

## 2024-05-30 DIAGNOSIS — R6521 Severe sepsis with septic shock: Secondary | ICD-10-CM | POA: Diagnosis present

## 2024-05-30 DIAGNOSIS — Z66 Do not resuscitate: Secondary | ICD-10-CM | POA: Diagnosis present

## 2024-05-30 DIAGNOSIS — R6520 Severe sepsis without septic shock: Secondary | ICD-10-CM | POA: Diagnosis not present

## 2024-05-30 DIAGNOSIS — N179 Acute kidney failure, unspecified: Secondary | ICD-10-CM | POA: Diagnosis present

## 2024-05-30 DIAGNOSIS — R569 Unspecified convulsions: Secondary | ICD-10-CM | POA: Diagnosis not present

## 2024-05-30 DIAGNOSIS — N189 Chronic kidney disease, unspecified: Secondary | ICD-10-CM | POA: Diagnosis not present

## 2024-05-30 DIAGNOSIS — B9781 Human metapneumovirus as the cause of diseases classified elsewhere: Secondary | ICD-10-CM | POA: Diagnosis present

## 2024-05-30 DIAGNOSIS — R7881 Bacteremia: Secondary | ICD-10-CM | POA: Diagnosis not present

## 2024-05-30 DIAGNOSIS — E785 Hyperlipidemia, unspecified: Secondary | ICD-10-CM | POA: Diagnosis present

## 2024-05-30 DIAGNOSIS — Z1152 Encounter for screening for COVID-19: Secondary | ICD-10-CM | POA: Diagnosis not present

## 2024-05-30 DIAGNOSIS — R4182 Altered mental status, unspecified: Secondary | ICD-10-CM | POA: Diagnosis not present

## 2024-05-30 DIAGNOSIS — Z7189 Other specified counseling: Secondary | ICD-10-CM | POA: Diagnosis not present

## 2024-05-30 LAB — RESPIRATORY PANEL BY PCR

## 2024-05-30 LAB — COMPREHENSIVE METABOLIC PANEL WITH GFR
ALT: 470 U/L — ABNORMAL HIGH (ref 0–44)
ALT: 344 U/L — ABNORMAL HIGH (ref 0–44)
AST: 173 U/L — ABNORMAL HIGH (ref 15–41)
AST: 167 U/L — ABNORMAL HIGH (ref 15–41)
Albumin: 2.9 g/dL — ABNORMAL LOW (ref 3.5–5.0)
Albumin: 2.5 g/dL — ABNORMAL LOW (ref 3.5–5.0)
Alkaline Phosphatase: 95 U/L (ref 38–126)
Alkaline Phosphatase: 68 U/L (ref 38–126)
Anion gap: 19 — ABNORMAL HIGH (ref 5–15)
Anion gap: 17 — ABNORMAL HIGH (ref 5–15)
BUN: 67 mg/dL — ABNORMAL HIGH (ref 8–23)
BUN: 62 mg/dL — ABNORMAL HIGH (ref 8–23)
CO2: 13 mmol/L — ABNORMAL LOW (ref 22–32)
CO2: 15 mmol/L — ABNORMAL LOW (ref 22–32)
Calcium: 9.3 mg/dL (ref 8.9–10.3)
Calcium: 7.9 mg/dL — ABNORMAL LOW (ref 8.9–10.3)
Chloride: 117 mmol/L — ABNORMAL HIGH (ref 98–111)
Chloride: 120 mmol/L — ABNORMAL HIGH (ref 98–111)
Creatinine, Ser: 2.78 mg/dL — ABNORMAL HIGH (ref 0.61–1.24)
Creatinine, Ser: 2.43 mg/dL — ABNORMAL HIGH (ref 0.61–1.24)
GFR, Estimated: 20 mL/min — ABNORMAL LOW (ref >60)
GFR, Estimated: 24 mL/min — ABNORMAL LOW (ref >60)
Glucose, Bld: 111 mg/dL — ABNORMAL HIGH (ref 70–99)
Glucose, Bld: 85 mg/dL (ref 70–99)
Potassium: 4.2 mmol/L (ref 3.5–5.1)
Potassium: 3.6 mmol/L (ref 3.5–5.1)
Sodium: 149 mmol/L — ABNORMAL HIGH (ref 135–145)
Sodium: 151 mmol/L — ABNORMAL HIGH (ref 135–145)
Total Bilirubin: 0.8 mg/dL (ref 0.0–1.2)
Total Bilirubin: 0.5 mg/dL (ref 0.0–1.2)
Total Protein: 5.8 g/dL — ABNORMAL LOW (ref 6.5–8.1)
Total Protein: 4.5 g/dL — ABNORMAL LOW (ref 6.5–8.1)

## 2024-05-30 LAB — CBC WITH DIFFERENTIAL/PLATELET
Abs Immature Granulocytes: 0.24 K/uL — ABNORMAL HIGH (ref 0.00–0.07)
Basophils Absolute: 0 K/uL (ref 0.0–0.1)
Basophils Relative: 0 %
Eosinophils Absolute: 0 K/uL (ref 0.0–0.5)
Eosinophils Relative: 0 %
HCT: 34 % — ABNORMAL LOW (ref 39.0–52.0)
Hemoglobin: 10.4 g/dL — ABNORMAL LOW (ref 13.0–17.0)
Immature Granulocytes: 2 %
Lymphocytes Relative: 5 %
Lymphs Abs: 0.5 K/uL — ABNORMAL LOW (ref 0.7–4.0)
MCH: 33.8 pg (ref 26.0–34.0)
MCHC: 30.6 g/dL (ref 30.0–36.0)
MCV: 110.4 fL — ABNORMAL HIGH (ref 80.0–100.0)
Monocytes Absolute: 0.8 K/uL (ref 0.1–1.0)
Monocytes Relative: 8 %
Neutro Abs: 9.6 K/uL — ABNORMAL HIGH (ref 1.7–7.7)
Neutrophils Relative %: 85 %
Platelets: 188 K/uL (ref 150–400)
RBC: 3.08 MIL/uL — ABNORMAL LOW (ref 4.22–5.81)
RDW: 15 % (ref 11.5–15.5)
Smear Review: NORMAL
WBC: 11.2 K/uL — ABNORMAL HIGH (ref 4.0–10.5)
nRBC: 0 % (ref 0.0–0.2)

## 2024-05-30 LAB — GLUCOSE, CAPILLARY
Glucose-Capillary: 111 mg/dL — ABNORMAL HIGH (ref 70–99)
Glucose-Capillary: 79 mg/dL (ref 70–99)
Glucose-Capillary: 87 mg/dL (ref 70–99)

## 2024-05-30 LAB — RESP PANEL BY RT-PCR (RSV, FLU A&B, COVID)  RVPGX2
Influenza A by PCR: NEGATIVE
Influenza B by PCR: NEGATIVE
Resp Syncytial Virus by PCR: NEGATIVE
SARS Coronavirus 2 by RT PCR: NEGATIVE

## 2024-05-30 LAB — URINALYSIS, W/ REFLEX TO CULTURE (INFECTION SUSPECTED)
Bilirubin Urine: NEGATIVE
Glucose, UA: NEGATIVE mg/dL
Hgb urine dipstick: NEGATIVE
Ketones, ur: NEGATIVE mg/dL
Leukocytes,Ua: NEGATIVE
Nitrite: NEGATIVE
Protein, ur: NEGATIVE mg/dL
RBC / HPF: 0 RBC/hpf (ref 0–5)
Specific Gravity, Urine: 1.014 (ref 1.005–1.030)
Squamous Epithelial / HPF: 0 /HPF (ref 0–5)
pH: 5 (ref 5.0–8.0)

## 2024-05-30 LAB — BLOOD GAS, ARTERIAL
Acid-base deficit: 7.6 mmol/L — ABNORMAL HIGH (ref 0.0–2.0)
Bicarbonate: 13 mmol/L — ABNORMAL LOW (ref 20.0–28.0)
O2 Content: 5 L/min
O2 Saturation: 99.4 %
Patient temperature: 37
pCO2 arterial: <18 mmHg — CL (ref 32–48)
pH, Arterial: 7.49 — ABNORMAL HIGH (ref 7.35–7.45)
pO2, Arterial: 229 mmHg — ABNORMAL HIGH (ref 83–108)

## 2024-05-30 LAB — PROTIME-INR
INR: 1.5 — ABNORMAL HIGH (ref 0.8–1.2)
Prothrombin Time: 19 s — ABNORMAL HIGH (ref 11.4–15.2)

## 2024-05-30 LAB — LACTIC ACID, PLASMA
Lactic Acid, Venous: 4.4 mmol/L (ref 0.5–1.9)
Lactic Acid, Venous: 2.1 mmol/L (ref 0.5–1.9)

## 2024-05-30 LAB — STREP PNEUMONIAE URINARY ANTIGEN: Strep Pneumo Urinary Antigen: NEGATIVE

## 2024-05-30 LAB — CBG MONITORING, ED: Glucose-Capillary: 84 mg/dL (ref 70–99)

## 2024-05-30 LAB — MRSA NEXT GEN BY PCR, NASAL: MRSA by PCR Next Gen: DETECTED — AB

## 2024-05-30 MED ORDER — DEXTROSE IN LACTATED RINGERS 5 % IV SOLN: INTRAVENOUS | Status: AC

## 2024-05-30 MED ORDER — CHLORHEXIDINE GLUCONATE CLOTH 2 % EX PADS
6.0000 | MEDICATED_PAD | Freq: Every day | CUTANEOUS | Status: DC
  Administered 2024-05-30: 6 via TOPICAL

## 2024-05-30 MED ORDER — SODIUM CHLORIDE 0.9 % IV SOLN
2.0000 g | Freq: Once | INTRAVENOUS | Status: AC
  Administered 2024-05-30: 2 g via INTRAVENOUS
  Filled 2024-05-30: qty 20

## 2024-05-30 MED ORDER — SODIUM CHLORIDE 0.9 % IV BOLUS
500.0000 mL | Freq: Once | INTRAVENOUS | Status: AC
  Administered 2024-05-30: 500 mL via INTRAVENOUS

## 2024-05-30 MED ORDER — SODIUM CHLORIDE 0.9 % IV BOLUS (SEPSIS): 250.0000 mL | Freq: Once | INTRAVENOUS | Status: DC

## 2024-05-30 MED ORDER — ACETAMINOPHEN 10 MG/ML IV SOLN
1000.0000 mg | Freq: Four times a day (QID) | INTRAVENOUS | Status: AC
  Administered 2024-05-30 – 2024-05-31 (×3): 1000 mg via INTRAVENOUS
  Filled 2024-05-30 (×5): qty 100

## 2024-05-30 MED ORDER — LACTATED RINGERS IV BOLUS (SEPSIS)
1000.0000 mL | Freq: Once | INTRAVENOUS | Status: AC
  Administered 2024-05-30: 1000 mL via INTRAVENOUS

## 2024-05-30 MED ORDER — SODIUM CHLORIDE 0.9 % IV BOLUS (SEPSIS): 1000.0000 mL | Freq: Once | INTRAVENOUS | Status: DC

## 2024-05-30 MED ORDER — HEPARIN SODIUM (PORCINE) 5000 UNIT/ML IJ SOLN
5000.0000 [IU] | Freq: Three times a day (TID) | INTRAMUSCULAR | Status: DC
  Administered 2024-05-30 – 2024-06-03 (×13): 5000 [IU] via SUBCUTANEOUS
  Filled 2024-05-30 (×13): qty 1

## 2024-05-30 MED ORDER — LINEZOLID 600 MG/300ML IV SOLN
600.0000 mg | Freq: Two times a day (BID) | INTRAVENOUS | Status: DC
  Administered 2024-05-30 – 2024-06-03 (×8): 600 mg via INTRAVENOUS
  Filled 2024-05-30 (×9): qty 300

## 2024-05-30 MED ORDER — SODIUM CHLORIDE 0.9 % IV SOLN
500.0000 mg | Freq: Once | INTRAVENOUS | Status: AC
  Administered 2024-05-30: 500 mg via INTRAVENOUS
  Filled 2024-05-30: qty 5

## 2024-05-30 MED ORDER — INSULIN ASPART 100 UNIT/ML IJ SOLN
0.0000 [IU] | INTRAMUSCULAR | Status: DC
  Administered 2024-05-31 – 2024-06-02 (×6): 1 [IU] via SUBCUTANEOUS
  Filled 2024-05-30 (×6): qty 1

## 2024-05-30 MED ORDER — LACTATED RINGERS IV BOLUS: 1000.0000 mL | Freq: Once | INTRAVENOUS | Status: DC

## 2024-05-30 MED ORDER — LACTATED RINGERS IV BOLUS (SEPSIS)
250.0000 mL | Freq: Once | INTRAVENOUS | Status: AC
  Administered 2024-05-30: 250 mL via INTRAVENOUS

## 2024-05-30 MED ORDER — MUPIROCIN 2 % EX OINT
1.0000 | TOPICAL_OINTMENT | Freq: Two times a day (BID) | CUTANEOUS | Status: DC
  Administered 2024-05-30 – 2024-06-03 (×8): 1 via NASAL
  Filled 2024-05-30 (×2): qty 22

## 2024-05-30 MED ORDER — SODIUM CHLORIDE 0.9 % IV SOLN
500.0000 mg | INTRAVENOUS | Status: DC
  Administered 2024-05-31 – 2024-06-02 (×3): 500 mg via INTRAVENOUS
  Filled 2024-05-30 (×5): qty 5

## 2024-05-30 MED ORDER — CHLORHEXIDINE GLUCONATE CLOTH 2 % EX PADS
6.0000 | MEDICATED_PAD | Freq: Every day | CUTANEOUS | Status: DC
  Administered 2024-05-30 – 2024-06-02 (×3): 6 via TOPICAL
  Filled 2024-05-30: qty 6

## 2024-05-30 MED ORDER — SODIUM CHLORIDE 0.9 % IV SOLN
2.0000 g | INTRAVENOUS | Status: DC
  Administered 2024-05-31 – 2024-06-02 (×3): 2 g via INTRAVENOUS
  Filled 2024-05-30 (×4): qty 20

## 2024-05-30 MED ORDER — CLOBETASOL PROPIONATE 0.05 % EX OINT
1.0000 | TOPICAL_OINTMENT | Freq: Two times a day (BID) | CUTANEOUS | Status: AC
  Administered 2024-05-30 – 2024-06-05 (×13): 1 via TOPICAL
  Filled 2024-05-30 (×2): qty 15

## 2024-05-30 MED ORDER — HYDROCORTISONE SOD SUC (PF) 100 MG IJ SOLR
50.0000 mg | Freq: Four times a day (QID) | INTRAMUSCULAR | Status: DC
  Administered 2024-05-30 – 2024-06-03 (×17): 50 mg via INTRAVENOUS
  Filled 2024-05-30: qty 1
  Filled 2024-05-30: qty 2
  Filled 2024-05-30: qty 1
  Filled 2024-05-30 (×2): qty 2
  Filled 2024-05-30 (×2): qty 1
  Filled 2024-05-30 (×2): qty 2
  Filled 2024-05-30 (×2): qty 1
  Filled 2024-05-30: qty 2
  Filled 2024-05-30: qty 1
  Filled 2024-05-30 (×2): qty 2
  Filled 2024-05-30: qty 1
  Filled 2024-05-30 (×2): qty 2
  Filled 2024-05-30 (×3): qty 1
  Filled 2024-05-30: qty 2

## 2024-05-30 NOTE — Progress Notes (Signed)
 1545 Patient received from ED. 89 year old DNR/DNI for AMS/ Respiratory failure from [facility]. Patient has fallen several times at [facility] and is now wheelchair bound. Patient 's health has declined over the last few weeks and months since April. Patient was admitted with multiple bruises and abraisons on arms and legs.  Feet bilaterally are cool and mottled. Right heels is red in color but blanches. Patient is very thin. Niece with patient. Wife is 71 years old and has dementia. Pictures of pre-existing skin issues in chart. Patient was cleaned of stool on admission. 1800 Patient asleep. Left FA iv re dressed.

## 2024-05-30 NOTE — ED Triage Notes (Addendum)
 Pt to Ed via [employer] from [facility]. Facility reports increased SOB over the weekend and worse today. Pt arrives on NRB and is tachypneac with labored with purse lip breathing. EMS was unable to obtain oxygen sat. Pt placed on 5L [place] on arrival and sats 81%. RT already at bedside

## 2024-05-30 NOTE — ED Notes (Signed)
 Lab called and informed some chart labels used due to label printer not working and acuity of pt.

## 2024-05-30 NOTE — H&P (Signed)
 NAME:  Nicholas Gross, MRN:  [id number], DOB:  12-21-1927, LOS: 0 ADMISSION DATE:  05/30/2024, CONSULTATION DATE:  05/30/2024 REFERRING MD: Lamar Price, MD, CHIEF COMPLAINT:  Respiratory Failure   History of Present Illness:   Patient is a 89 year old male presenting to the hospital from his nursing facility with increased work of breathing and shortness of breath.  Patient is disoriented and unable to provide any meaningful history.  Collateral history was obtained through chart review, discussion with ED physician, and conversation with family members (sister and niece).  Family reports that the patient had symptoms of shortness of breath and oxygen requirements around a week ago that improved a few days ago only to worsen over the past couple of days.  They report that he was noted to have increased work of breathing and increased oxygen requirements at his nursing facility prompting transfer to the emergency department.  Family reported the patient has not had similar symptoms recently and he is not oxygen dependent.  They report that he is mostly bedbound and sometimes is in a wheelchair.  He requires help with almost all of his activities of daily living.  He is able to hold a conversation but is not as verbal as he was in the past.  On presentation to the ED, he was noted to have increased work of breathing requiring oxygen support via nasal cannula.  His blood work shows an acute kidney injury with creatinine of 2.78 and a BUN of 67.  He has elevated liver function tests, hypernatremia (149), and lactic acidosis of 4.4.  Chest x-ray showed right lower lobe pneumonia.  COVID, influenza, and RSV PCR was negative.  Blood cultures were drawn and the patient was ordered IV antibiotics with ceftriaxone  and azithromycin  as well as IV fluids.  Patient is DNR/DNI based on his wishes and a signed MOLST form. ICU team was consulted for consideration of admission to the ICU.  Pertinent  Medical  History  Bullous pemphigoid chronically on prednisone  20 mg daily Hypertension on lisinopril  Hyperlipidemia Hip fracture s/p IM nail fixation 11/2023  Significant Hospital Events: Including procedures, antibiotic start and stop dates in addition to other pertinent events   05/30/2024: Admitted with respiratory failure  Objective    Blood pressure (!) 96/57, pulse (!) 103, temperature 98.9 F (37.2 C), temperature source Oral, resp. rate (!) 36, SpO2 94%.       No intake or output data in the 24 hours ending 05/30/24 1257 There were no vitals filed for this visit.  Examination: Physical Exam Constitutional:      General: He is in acute distress.     Appearance: He is ill-appearing.  Cardiovascular:     Rate and Rhythm: Regular rhythm. Tachycardia present.     Heart sounds: Normal heart sounds.  Pulmonary:     Effort: Respiratory distress present.     Breath sounds: No wheezing or rhonchi.  Abdominal:     Palpations: Abdomen is soft.  Neurological:     Mental Status: He is disoriented.     Assessment and Plan   #Acute Hypoxic Respiratory Failure #Community Acquired Pneumonia #Severe Sepsis  #Septic Shock #Acute Kidney Injury #Bullous Pemphigoid on chronic steroids #Toxic Metabolic Encephalopathy  Neuro - Encephalopathy in the setting of severe sepsis secondary to pneumonia. Patient has poor mental status at baseline, worsened by sepsis and kidney injury with uremia. Avoiding psychotropic medications as able. CV - hypotensive on presentation due to septic shock with lactic acidosis and signs of  end organ damage (hypotension, AKI, encephalopathy) in the setting of pneumonia and known underlying malnutrition. Receiving IV fluids for resuscitation, with potential for escalation of care with IV vasopressors. Will continue to trend lactic acid after volume resuscitation. Pulm - Hypoxic respiratory failure secondary to RLL community acquired pneumonia. He might have had a  superimposed bacterial infection over prior viral illness. Will check full viral panel, continue oxygen supplementation, start IV antibiotics, and initiate IV hydrocortisone  for adjunct treatment with severe CAP. GI - NPO in the setting of poor mental status Renal - AKI in the setting of dehydration, severe sepsis, and possible direct injury from Lisinopril . Patient DNR/DNI, and would not want dialysis based on wishes. Will continue with volume resuscitation with LR and closely follow kidney function. Endo - ICU glycemic protocol. Starting hydrocortisone  for adjunct treatment with pneumonia as well as for stress dosing given sepsis and chronic prednisone  use. Hem/Onc - Heparin  for DVT prophylaxis ID - CAP with RLL infiltrate on CXR. Will obtain strep and legionella urinary antigens, attempt to obtain respiratory cultures, start broad spectrum antibiotics (CTX/Azithro) and obtain a Chest CT to rule out an empyema. Other - patient DNR/DNI based on wishes and signed MOLST form. He has increased work of breathing, respiratory fialure, and AKI. Explained to family that he is at high risk of decompensation and mortality. We agreed that we would re-evaluate the patient in the next 12 to 24 hours, and consider transition to comfort measures only if he doesn't respond to prescribed treatment plan.  Labs   CBC: Recent Labs  Lab 05/30/24 1054  WBC 11.2*  NEUTROABS 9.6*  HGB 10.4*  HCT 34.0*  MCV 110.4*  PLT 188    Basic Metabolic Panel: Recent Labs  Lab 05/30/24 1054  NA 149*  K 4.2  CL 117*  CO2 13*  GLUCOSE 111*  BUN 67*  CREATININE 2.78*  CALCIUM  9.3   GFR: CrCl cannot be calculated (Unknown ideal weight.). Recent Labs  Lab 05/30/24 1054  WBC 11.2*  LATICACIDVEN 4.4*    Liver Function Tests: Recent Labs  Lab 05/30/24 1054  AST 173*  ALT 470*  ALKPHOS 95  BILITOT 0.8  PROT 5.8*  ALBUMIN 2.9*   No results for input(s): LIPASE, AMYLASE in the last 168 hours. No results  for input(s): AMMONIA in the last 168 hours.  ABG    Component Value Date/Time   PHART 7.49 (H) 05/30/2024 1054   PCO2ART <18 (LL) 05/30/2024 1054   PO2ART 229 (H) 05/30/2024 1054   HCO3 13.0 (L) 05/30/2024 1054   ACIDBASEDEF 7.6 (H) 05/30/2024 1054   O2SAT 99.4 05/30/2024 1054     Coagulation Profile: No results for input(s): INR, PROTIME in the last 168 hours.  Cardiac Enzymes: No results for input(s): CKTOTAL, CKMB, CKMBINDEX, TROPONINI in the last 168 hours.  HbA1C: HB A1C (BAYER DCA - WAIVED)  Date/Time Value Ref Range Status  02/13/2016 08:34 AM 5.4 <7.0 % Final    Comment:                                          Diabetic Adult            <7.0  Healthy Adult        4.3 - 5.7                                                           (DCCT/NGSP) American Diabetes Association's Summary of Glycemic Recommendations for Adults with Diabetes: Hemoglobin A1c <7.0%. More stringent glycemic goals (A1c <6.0%) may further reduce complications at the cost of increased risk of hypoglycemia.    Hgb A1c MFr Bld  Date/Time Value Ref Range Status  12/13/2021 04:28 AM 5.1 4.8 - 5.6 % Final    Comment:    (NOTE) Pre diabetes:          5.7%-6.4%  Diabetes:              >6.4%  Glycemic control for   <7.0% adults with diabetes     CBG: No results for input(s): GLUCAP in the last 168 hours.  Review of Systems:   N/A  Past Medical History:  He,  has a past medical history of CAD (coronary artery disease), History of GI bleed (2008), History of tobacco use, Hyperlipidemia, Hypertension, and Overweight.   Surgical History:   Past Surgical History:  Procedure Laterality Date   CAROTID ENDARTERECTOMY Left 2010   CATARACT EXTRACTION     cypher stent  09/12/02   s/p cypher stent mid-LAD   HIP ARTHROPLASTY Left 10/29/2023   Procedure: HEMIARTHROPLASTY (BIPOLAR) HIP, POSTERIOR APPROACH FOR FRACTURE;  Surgeon: Edie Norleen PARAS, MD;   Location: [facility] ORS;  Service: Orthopedics;  Laterality: Left;   INTRAMEDULLARY (IM) NAIL INTERTROCHANTERIC Right 12/07/2023   Procedure: FIXATION, FRACTURE, INTERTROCHANTERIC, WITH INTRAMEDULLARY ROD;  Surgeon: Lorelle Hussar, MD;  Location: [facility] ORS;  Service: Orthopedics;  Laterality: Right;   TENDON REPAIR  1991   finger and arm     Social History:   reports that he quit smoking about 55 years ago. His smoking use included cigarettes. He started smoking about 72 years ago. He has a 17 pack-year smoking history. He has never used smokeless tobacco. He reports that he does not drink alcohol  and does not use drugs.   Family History:  His family history includes AAA (abdominal aortic aneurysm) in his brother; Cancer in his sister and sister; Heart disease in his father.   Allergies Allergies  Allergen Reactions   Other     Patient states that he is allergic to something that they place in the IV before they work on you   Simvastatin  Other (See Comments)    Myalgia      Home Medications  Prior to Admission medications   Medication Sig Start Date End Date Taking? Authorizing Provider  acetaminophen  (TYLENOL ) 325 MG tablet Take 2 tablets (650 mg total) by mouth every 4 (four) hours as needed for moderate pain (pain score 4-6). 10/05/23 10/04/24 Yes Agbata, Tochukwu, MD  aspirin  EC 81 MG tablet Take 81 mg by mouth daily. Swallow whole.   Yes [provider]  calcium  carbonate (OS-CAL) 600 MG tablet Take 600 mg by mouth 2 (two) times daily.   Yes [provider]  carboxymethylcellulose (REFRESH TEARS) 0.5 % SOLN Place 1 drop into both eyes 3 (three) times daily as needed (dry eyes). 10/07/23  Yes [provider]  cetirizine  (ZYRTEC  ALLERGY) 10 MG tablet Take 1 tablet (10 mg total) by  mouth daily. Home med. 12/14/23  Yes Awanda City, MD  clobetasol  ointment (TEMOVATE ) 0.05 % 1 Application 2 (two) times daily. 05/28/23  Yes [provider]  cyanocobalamin   (VITAMIN B12) 250 MCG tablet Take 250 mcg by mouth daily.   Yes [provider]  diphenhydrAMINE  (BENADRYL ) 25 mg capsule Take 1 capsule (25 mg total) by mouth at bedtime as needed for itching. Patient taking differently: Take 25 mg by mouth every 6 (six) hours as needed for itching. 12/14/23  Yes Awanda City, MD  doxycycline  (VIBRA -TABS) 100 MG tablet Take 100 mg by mouth 2 (two) times daily.   Yes [provider]  furosemide  (LASIX ) 20 MG tablet Take 40 mg by mouth daily.   Yes [provider]  guaiFENesin (MUCINEX) 600 MG 12 hr tablet Take 600 mg by mouth 2 (two) times daily.   Yes [provider]  HYDROcodone -acetaminophen  (NORCO/VICODIN) 5-325 MG tablet Take 1 tablet by mouth every 6 (six) hours as needed for moderate pain (pain score 4-6) or severe pain (pain score 7-10). Patient taking differently: Take 1 tablet by mouth daily. May take one additional tablet in the afternoon for moderate to severe pain 12/10/23  Yes Charlene Debby BROCKS, PA-C  lisinopril  (ZESTRIL ) 5 MG tablet Take 1 tablet (5 mg total) by mouth daily. Patient taking differently: Take 3 tablets by mouth daily. 07/06/23  Yes Johnson, Megan P, DO  Menthol -Zinc  Oxide (CALMOSEPTINE) 0.44-20.6 % OINT Apply 1 Application topically in the morning, at noon, and at bedtime.   Yes [provider]  Multiple Vitamins-Minerals (PRESERVISION AREDS PO) Take 1 tablet by mouth 2 (two) times daily.   Yes [provider]  niacinamide  500 MG tablet Take 1 tablet (500 mg total) by mouth 2 (two) times daily with a meal. 12/14/23  Yes Awanda City, MD  potassium chloride  (KLOR-CON ) 10 MEQ tablet Take 20 mEq by mouth daily.   Yes [provider]  predniSONE  (DELTASONE ) 20 MG tablet Take 20 mg by mouth daily with breakfast.   Yes [provider]  rosuvastatin  (CRESTOR ) 10 MG tablet TAKE 1 TABLET BY MOUTH ONCE DAILY 01/08/24  Yes Johnson, Megan P, DO  tamsulosin  (FLOMAX ) 0.4 MG CAPS capsule Take  0.4 mg by mouth daily.   Yes [provider]  enoxaparin  (LOVENOX ) 40 MG/0.4ML injection Inject 0.4 mLs (40 mg total) into the skin daily for 14 days. 12/14/23 12/28/23  Awanda City, MD  feeding supplement (ENSURE PLUS HIGH PROTEIN) LIQD Take 237 mLs by mouth 2 (two) times daily between meals. 12/14/23   Awanda City, MD     The patient is critically ill due to acute hypoxic respiratory failure, pneumonia, AKI.  Critical care was necessary to treat or prevent imminent or life-threatening deterioration. I personally performed high risk medication and infusion titration and management, non-invasive positive pressure ventilation management and titration, blood gas interpretation, fluid resuscitation (bolus), and end of life planning conversations. Critical care time was spent by me on the following activities: development of a treatment plan with the patient and/or surrogate as well as nursing, discussions with consultants, evaluation of the patient's response to treatment, examination of the patient, obtaining a history from the patient or surrogate, ordering and performing treatments and interventions, ordering and review of laboratory studies, ordering and review of radiographic studies, review of telemetry data including pulse oximetry, re-evaluation of patient's condition and participation in multidisciplinary rounds.   I personally spent 52 minutes providing critical care not including any separately billable procedures.  Belva November, MD [facility] 05/30/2024 1:30 PM

## 2024-05-30 NOTE — Consult Note (Signed)
 CODE SEPSIS - PHARMACY COMMUNICATION  **Broad Spectrum Antibiotics should be administered within 1 hour of Sepsis diagnosis**  Time Code Sepsis Called/Page Received: 1209  Antibiotics Ordered: Azithro x1 and rocephin  x1   Time of 1st antibiotic administration: 1236  Additional action taken by pharmacy: none  If necessary, Name of Provider/Nurse Contacted: n/a    Annabella LOISE Banks ,PharmD Clinical Pharmacist  05/30/2024  12:29 PM

## 2024-05-30 NOTE — Sepsis Progress Note (Signed)
 Sepsis protocol monitored by eLink

## 2024-05-30 NOTE — ED Provider Notes (Signed)
 [facility] Provider Note    Event Date/Time   First MD Initiated Contact with Patient 05/30/24 1032     (approximate)   History   Respiratory Distress   HPI  Nicholas Gross is a 89 y.o. male with a history of CAD who presents with shortness of breath.  EMS started the patient on nonrebreather.  Little history available     Physical Exam   Triage Vital Signs: ED Triage Vitals  Encounter Vitals Group     BP 05/30/24 1040 (!) 76/63     Girls Systolic BP Percentile --      Girls Diastolic BP Percentile --      Boys Systolic BP Percentile --      Boys Diastolic BP Percentile --      Pulse Rate 05/30/24 1041 (!) 114     Resp 05/30/24 1040 (!) 43     Temp 05/30/24 1040 98.9 F (37.2 C)     Temp Source 05/30/24 1040 Oral     SpO2 05/30/24 1040 (!) 81 %     Weight --      Height --      Head Circumference --      Peak Flow --      Pain Score --      Pain Loc --      Pain Education --      Exclude from Growth Chart --     Most recent vital signs: Vitals:   05/30/24 1130 05/30/24 1200  BP: (!) 108/50 (!) 96/57  Pulse:  (!) 103  Resp:  (!) 36  Temp:    SpO2:  94%     General: Awake,  CV:  Good peripheral perfusion.  Resp:  Tachypnea, no wheezing, bibasilar rales Abd:  No distention.  Other:  No lower extremity edema   ED Results / Procedures / Treatments   Labs (all labs ordered are listed, but only abnormal results are displayed) Labs Reviewed  LACTIC ACID, PLASMA - Abnormal; Notable for the following components:      Result Value   Lactic Acid, Venous 4.4 (*)    All other components within normal limits  COMPREHENSIVE METABOLIC PANEL WITH GFR - Abnormal; Notable for the following components:   Sodium 149 (*)    Chloride 117 (*)    CO2 13 (*)    Glucose, Bld 111 (*)    BUN 67 (*)    Creatinine, Ser 2.78 (*)    Total Protein 5.8 (*)    Albumin 2.9 (*)    AST 173 (*)    ALT 470 (*)    GFR, Estimated 20 (*)    Anion  gap 19 (*)    All other components within normal limits  CBC WITH DIFFERENTIAL/PLATELET - Abnormal; Notable for the following components:   WBC 11.2 (*)    RBC 3.08 (*)    Hemoglobin 10.4 (*)    HCT 34.0 (*)    MCV 110.4 (*)    Neutro Abs 9.6 (*)    Lymphs Abs 0.5 (*)    Abs Immature Granulocytes 0.24 (*)    All other components within normal limits  BLOOD GAS, ARTERIAL - Abnormal; Notable for the following components:   pH, Arterial 7.49 (*)    pCO2 arterial <18 (*)    pO2, Arterial 229 (*)    Bicarbonate 13.0 (*)    Acid-base deficit 7.6 (*)    All other components within normal limits  PROTIME-INR -  Abnormal; Notable for the following components:   Prothrombin Time 19.0 (*)    INR 1.5 (*)    All other components within normal limits  RESP PANEL BY RT-PCR (RSV, FLU A&B, COVID)  RVPGX2  CULTURE, BLOOD (ROUTINE X 2)  CULTURE, BLOOD (ROUTINE X 2)  MRSA NEXT GEN BY PCR, NASAL  RESPIRATORY PANEL BY PCR  CULTURE, RESPIRATORY W GRAM STAIN  LACTIC ACID, PLASMA  URINALYSIS, W/ REFLEX TO CULTURE (INFECTION SUSPECTED)  CBC  CREATININE, SERUM  LEGIONELLA PNEUMOPHILA SEROGP 1 UR AG  STREP PNEUMONIAE URINARY ANTIGEN  HEMOGLOBIN A1C     EKG  ED ECG REPORT I, Lamar Price, the attending physician, personally viewed and interpreted this ECG.  Date: 05/30/2024  Rhythm: Likely normal rhythm, QRS Axis: normal Intervals: normal ST/T Wave abnormalities: normal Narrative Interpretation: Tremors interfering with interpretation    RADIOLOGY Chest x-ray reviewed interpreted by me concerning for right-sided pneumonia, pending radiology review   PROCEDURES:  Critical Care performed: yes  CRITICAL CARE Performed by: Lamar Price   Total critical care time: 30 minutes  Critical care time was exclusive of separately billable procedures and treating other patients.  Critical care was necessary to treat or prevent imminent or life-threatening deterioration.  Critical care  was time spent personally by me on the following activities: development of treatment plan with patient and/or surrogate as well as nursing, discussions with consultants, evaluation of patient's response to treatment, examination of patient, obtaining history from patient or surrogate, ordering and performing treatments and interventions, ordering and review of laboratory studies, ordering and review of radiographic studies, pulse oximetry and re-evaluation of patient's condition.   Procedures   MEDICATIONS ORDERED IN ED: Medications  cefTRIAXone  (ROCEPHIN ) 2 g in sodium chloride  0.9 % 100 mL IVPB (2 g Intravenous New Bag/Given 05/30/24 1236)  azithromycin  (ZITHROMAX ) 500 mg in sodium chloride  0.9 % 250 mL IVPB (has no administration in time range)  Chlorhexidine  Gluconate Cloth 2 % PADS 6 each (has no administration in time range)  heparin  injection 5,000 Units (has no administration in time range)  insulin  aspart (novoLOG ) injection 0-6 Units (has no administration in time range)  cefTRIAXone  (ROCEPHIN ) 2 g in sodium chloride  0.9 % 100 mL IVPB (has no administration in time range)  azithromycin  (ZITHROMAX ) 500 mg in sodium chloride  0.9 % 250 mL IVPB (has no administration in time range)  clobetasol  ointment (TEMOVATE ) 0.05 % 1 Application (has no administration in time range)  hydrocortisone  sodium succinate  (SOLU-CORTEF ) 100 MG injection 50 mg (has no administration in time range)  lactated ringers  bolus 1,000 mL (has no administration in time range)  lactated ringers  bolus 1,000 mL (has no administration in time range)    And  lactated ringers  bolus 1,000 mL (has no administration in time range)    And  lactated ringers  bolus 250 mL (has no administration in time range)  sodium chloride  0.9 % bolus 500 mL (500 mLs Intravenous New Bag/Given 05/30/24 1053)     IMPRESSION / MDM / ASSESSMENT AND PLAN / ED COURSE  I reviewed the triage vital signs and the nursing notes. Patient's  presentation is most consistent with acute presentation with potential threat to life or bodily function.  Patient presents with shortness of breath, he is hypoxic and hypotensive and tachycardic and tachypneic  Differential includes pneumonia, sepsis, pulmonary edema  IV fluids infusing, x-ray, labs pending, patient is on a cardiac monitor  He does have a DNR   ----------------------------------------- 12:11 PM on 05/30/2024 ----------------------------------------- Chest  x-ray appears consistent with pneumonia, notified of lactic of 4.4, will start normal saline 30 mL/kg  Broad-spectrum IV antibiotics ordered, code sepsis activated  Will consult the hospitalist team for admission.  Blood pressure has improved, patient remains tachycardic and is requiring nasal cannula oxygen  Hospitalist recommends ICU admission,  spoke with Dr. Isadora who will admit the patient   Sepsis - Repeat Assessment  Performed at:    12:35 pm  Vitals     Blood pressure (!) 96/57, pulse (!) 103, temperature 98.9 F (37.2 C), temperature source Oral, resp. rate (!) 36, SpO2 94%.  Heart:     Tachycardic  Lungs:    Rales  Capillary Refill:   <2 sec  Peripheral Pulse:   Radial pulse palpable  Skin:     Normal Color        FINAL CLINICAL IMPRESSION(S) / ED DIAGNOSES   Final diagnoses:  Septic shock (HCC)     Rx / DC Orders   ED Discharge Orders     None        Note:  This document was prepared using Dragon voice recognition software and may include unintentional dictation errors.   Arlander Charleston, MD 05/30/24 [phone]

## 2024-05-31 ENCOUNTER — Encounter: Payer: Self-pay | Admitting: Student in an Organized Health Care Education/Training Program

## 2024-05-31 ENCOUNTER — Inpatient Hospital Stay

## 2024-05-31 DIAGNOSIS — D509 Iron deficiency anemia, unspecified: Secondary | ICD-10-CM | POA: Insufficient documentation

## 2024-05-31 DIAGNOSIS — D508 Other iron deficiency anemias: Secondary | ICD-10-CM

## 2024-05-31 DIAGNOSIS — L299 Pruritus, unspecified: Secondary | ICD-10-CM | POA: Insufficient documentation

## 2024-05-31 DIAGNOSIS — E8729 Other acidosis: Secondary | ICD-10-CM

## 2024-05-31 DIAGNOSIS — D696 Thrombocytopenia, unspecified: Secondary | ICD-10-CM | POA: Insufficient documentation

## 2024-05-31 DIAGNOSIS — R7989 Other specified abnormal findings of blood chemistry: Secondary | ICD-10-CM

## 2024-05-31 DIAGNOSIS — N179 Acute kidney failure, unspecified: Secondary | ICD-10-CM

## 2024-05-31 DIAGNOSIS — J188 Other pneumonia, unspecified organism: Secondary | ICD-10-CM

## 2024-05-31 DIAGNOSIS — A419 Sepsis, unspecified organism: Secondary | ICD-10-CM

## 2024-05-31 DIAGNOSIS — J9601 Acute respiratory failure with hypoxia: Secondary | ICD-10-CM

## 2024-05-31 LAB — HEMOGLOBIN A1C
Hgb A1c MFr Bld: 5.3 % (ref 4.8–5.6)
Mean Plasma Glucose: 105.41 mg/dL

## 2024-05-31 LAB — GLUCOSE, CAPILLARY
Glucose-Capillary: 110 mg/dL — ABNORMAL HIGH (ref 70–99)
Glucose-Capillary: 120 mg/dL — ABNORMAL HIGH (ref 70–99)
Glucose-Capillary: 154 mg/dL — ABNORMAL HIGH (ref 70–99)
Glucose-Capillary: 121 mg/dL — ABNORMAL HIGH (ref 70–99)
Glucose-Capillary: 131 mg/dL — ABNORMAL HIGH (ref 70–99)
Glucose-Capillary: 154 mg/dL — ABNORMAL HIGH (ref 70–99)

## 2024-05-31 LAB — BASIC METABOLIC PANEL WITH GFR
Anion gap: 16 — ABNORMAL HIGH (ref 5–15)
BUN: 61 mg/dL — ABNORMAL HIGH (ref 8–23)
CO2: 15 mmol/L — ABNORMAL LOW (ref 22–32)
Calcium: 7.9 mg/dL — ABNORMAL LOW (ref 8.9–10.3)
Chloride: 119 mmol/L — ABNORMAL HIGH (ref 98–111)
Creatinine, Ser: 2.32 mg/dL — ABNORMAL HIGH (ref 0.61–1.24)
GFR, Estimated: 25 mL/min — ABNORMAL LOW (ref >60)
Glucose, Bld: 122 mg/dL — ABNORMAL HIGH (ref 70–99)
Potassium: 3.6 mmol/L (ref 3.5–5.1)
Sodium: 150 mmol/L — ABNORMAL HIGH (ref 135–145)

## 2024-05-31 LAB — PHOSPHORUS: Phosphorus: 4.6 mg/dL (ref 2.5–4.6)

## 2024-05-31 LAB — CBC
HCT: 24.3 % — ABNORMAL LOW (ref 39.0–52.0)
Hemoglobin: 7.8 g/dL — ABNORMAL LOW (ref 13.0–17.0)
MCH: 33.9 pg (ref 26.0–34.0)
MCHC: 32.1 g/dL (ref 30.0–36.0)
MCV: 105.7 fL — ABNORMAL HIGH (ref 80.0–100.0)
Platelets: 125 K/uL — ABNORMAL LOW (ref 150–400)
RBC: 2.3 MIL/uL — ABNORMAL LOW (ref 4.22–5.81)
RDW: 15.2 % (ref 11.5–15.5)
WBC: 7.1 K/uL (ref 4.0–10.5)
nRBC: 0 % (ref 0.0–0.2)

## 2024-05-31 LAB — LACTIC ACID, PLASMA: Lactic Acid, Venous: 1.5 mmol/L (ref 0.5–1.9)

## 2024-05-31 LAB — MAGNESIUM: Magnesium: 2.2 mg/dL (ref 1.7–2.4)

## 2024-05-31 MED ORDER — DIPHENHYDRAMINE-ZINC ACETATE 2-0.1 % EX CREA
TOPICAL_CREAM | Freq: Two times a day (BID) | CUTANEOUS | Status: DC
  Administered 2024-05-31 – 2024-06-02 (×2): 1 via TOPICAL
  Filled 2024-05-31 (×2): qty 28

## 2024-05-31 NOTE — Assessment & Plan Note (Signed)
 Creatinine 2.32 today, creatinine 2.78 on presentation.  On gentle fluids.

## 2024-05-31 NOTE — Assessment & Plan Note (Addendum)
 Had a pulse ox of 81% on 5 L.  Taper oxygen as needed.  Metapneumovirus and rhinovirus on respiratory panel plus pneumonia.

## 2024-05-31 NOTE — Assessment & Plan Note (Signed)
 Hemoglobin down to 7.8 with IV fluid hydration.  May end up needing a blood transfusion during the hospital course.

## 2024-05-31 NOTE — Plan of Care (Signed)
 Unable to educate patient, at times will node head, follow simple commands, but unable to have a conversation. CT performed-pending results. Patient is on 2 liters of oxygen. Ice chips provided with nursing assistance. Pure-wick intact. Family update.

## 2024-05-31 NOTE — Assessment & Plan Note (Signed)
 Continue to monitor

## 2024-05-31 NOTE — Hospital Course (Signed)
 Hospital course / significant events:   89 year old male presenting to the hospital from his nursing facility with AMS, SOB.   HPI: Family reports that the patient had symptoms of shortness of breath and oxygen requirements around a week ago that improved a few days ago only to worsen over the past couple of days.  They report that he was noted to have increased work of breathing and increased oxygen requirements at his nursing facility prompting transfer to the emergency department.  Family reported the patient has not had similar symptoms recently and he is not oxygen dependent.  They report that he is mostly bedbound and sometimes is in a wheelchair.  He requires help with almost all of his activities of daily living.  He is able to hold a conversation but is not as verbal as he was in the past.   12/08: On presentation to the ED, acute hypoxic resp faiol, AKI Cr 2.78 and a BUN of 67. Elevated LFT, hypernatremia (149), lactic acidosis of 4.4.  CXR RLL PNA. COVID, influenza, and RSV PCR was negative.  Blood cultures were drawn. Start IV antibiotics with ceftriaxone  and azithromycin . IV fluids. Patient is DNR/DNI based on his wishes and a signed MOLST form. ICU team was consulted for consideration of admission to the ICU in case needing pressors. 12/09: Hospitalist team assumes care. Patient able to follow some simple commands like opening his mouth and wiggling his toes.  Mental status still not back to baseline.  Unable to pass swallow eval.  Will get palliative care consultation.  Viral respiratory panel positive for metapneumovirus and rhinovirus.   Continue empiric antibiotics for pneumonia. Patient with elevated creatinine 2.3 and sodium 150 12/10: Cr 2.53, Na 153, still not waking up to eat / not following commands. (+)MRSA BCx, ID recs continue abx escalate linezolid  + ceftriaxone  + azithro + daptomycin .  12/11: more alert this afternoon, following commands, still not eating but SLP to revisit  tomorrow. D/w family and palliative team today, continue current care, likely do not want to put him thorugh TEE, open to PICC and abx if his appetite is improving as long as he isn't suffering but niece notes she suspects 'his time is close' - will continue to revisit goals pending clinical progression  12/12: less alert today not following commands, appears more agitated. EEG global slowing. Still not eating. Family has made decision this afternoon for comfort measures.  12/13: hospice team to assess for inpatient  12/14: active dying process. Pt is not stable for transport, continue EOL care in the hospital.       Consultants:  ICU on admission Palliative Care  Infectious disease   Procedures/Surgeries: none      ASSESSMENT & PLAN:   Comfort measures status --> Discontinue aggressive treatment(s) aimed at cure --> Palliative / comfort focused care --> meds as below for symptomatic care, frequent assessments for symptoms, maintain comfort/dignity, he is NOT stable at this time for hospice placement outside the hospital, continue EOL care here   Current Facility-Administered Medications    diphenhydrAMINE  (BENADRYL ) injection 12.5 mg   hydromorphone  (DILAUDID ) injection 0.5-2 mg   camphor-menthol  (SARNA) lotion   clobetasol  ointment (TEMOVATE ) 0.05 % 1 Application   glycopyrrolate  (ROBINUL ) tablet 1 mg **OR** glycopyrrolate  (ROBINUL ) injection 0.2 mg **OR** glycopyrrolate  (ROBINUL ) injection 0.2 mg   haloperidol  (HALDOL ) tablet 0.5 mg **OR** haloperidol  (HALDOL ) 2 MG/ML solution 0.5 mg **OR** haloperidol  lactate (HALDOL ) injection 0.5 mg   loperamide  (IMODIUM ) capsule 2 mg   LORazepam  (  ATIVAN ) tablet 1 mg **OR** LORazepam  (ATIVAN ) 2 MG/ML concentrated solution 1 mg **OR** LORazepam  (ATIVAN ) injection 1 mg    Hospital problems: Severe sepsis, Present on admission  End organ dysfunction: acute respiratory failure, acute kidney injury, altered mental status, hypotension,  tachycardia and tachypnea.   Multifocal pneumonia Viral + bacterial pneumonia Concern for aspiration pneumonia / pneumonitis  MRSA bacteremia Acute respiratory failure with hypoxia   AKI (acute kidney injury) Likely secondary to infection and acute kidney injury / question RTA Acute metabolic encephalopathy Not responsive to treatment of underlying cause(s) Question underlying mild cognitive impairment / vascular dementia, imaging is not diagnostic but is supportive of this ddx  CT notes diffuse cerebral parenchymal volume loss and moderate chronic ischemic disease - question vascular dementia with acute worsening now d/t metabolic insults  Advanced care planning Right arm contracted  Bullous pemphigoid Hypernatremia High anion gap metabolic acidosis on admission AG now normal but persistent Cl high and CO2 low  Elevated liver function tests on admission Thrombocytopenia Iron  deficiency anemia Borderline underweight based on BMI 19

## 2024-05-31 NOTE — Plan of Care (Signed)
  Problem: Fluid Volume: Goal: Ability to maintain a balanced intake and output will improve Outcome: Progressing   Problem: Clinical Measurements: Goal: Ability to maintain clinical measurements within normal limits will improve Outcome: Progressing Goal: Diagnostic test results will improve Outcome: Progressing Goal: Respiratory complications will improve Outcome: Progressing

## 2024-05-31 NOTE — Assessment & Plan Note (Signed)
 Likely secondary to infection and acute kidney injury.

## 2024-05-31 NOTE — Assessment & Plan Note (Addendum)
 Unable to pass swallow eval today.  Right arm contracted will get CT scan of the head.

## 2024-05-31 NOTE — Evaluation (Signed)
 Clinical/Bedside Swallow Evaluation Patient Details  Name: ABDIMALIK MAYORQUIN MRN: [id number] Date of Birth: 05-Nov-1927  Today's Date: 05/31/2024 Time: SLP Start Time (ACUTE ONLY): 1150 SLP Stop Time (ACUTE ONLY): 1205 SLP Time Calculation (min) (ACUTE ONLY): 15 min  Past Medical History:  Past Medical History:  Diagnosis Date   CAD (coronary artery disease)    History of GI bleed 2008   History of tobacco use    17 pack years, quit around 1970   Hyperlipidemia    Hypertension    Overweight    Past Surgical History:  Past Surgical History:  Procedure Laterality Date   CAROTID ENDARTERECTOMY Left 2010   CATARACT EXTRACTION     cypher stent  09/12/02   s/p cypher stent mid-LAD   HIP ARTHROPLASTY Left 10/29/2023   Procedure: HEMIARTHROPLASTY (BIPOLAR) HIP, POSTERIOR APPROACH FOR FRACTURE;  Surgeon: Edie Norleen PARAS, MD;  Location: [facility] ORS;  Service: Orthopedics;  Laterality: Left;   INTRAMEDULLARY (IM) NAIL INTERTROCHANTERIC Right 12/07/2023   Procedure: FIXATION, FRACTURE, INTERTROCHANTERIC, WITH INTRAMEDULLARY ROD;  Surgeon: Lorelle Hussar, MD;  Location: [facility] ORS;  Service: Orthopedics;  Laterality: Right;   TENDON REPAIR  1991   finger and arm   HPI:  89 year old male presenting to the hospital (05/30/2024) from his nursing facility with increased work of breathing and shortness of breath. Viral respiratory panel positive for metapneumovirus and rhinovirus.  Patient with elevated creatinine and sodium. Chest CT (05/30/2024) revealed There is multifocal bilateral airspace disease, most pronounced within the right upper, right lower, and left lower lobes, consistent with bilateral bronchopneumonia. Trace right pleural effusion. Pt is known to ST services for treatment of dysphagia from previous admissions. Family at bedside report more recent diet at [facility] was dysphagia 1 with thin liquids. They report unintentional weight loss and decreased PO consumption over the last month  with increased coughing during consumption of thin liquids at facility.    Assessment / Plan / Recommendation  Clinical Impression  During this evaluation, pt was awake with eyes open but unable to follow any directions and didn't look at this writer (he mainly starred straight ahead), pt with no attempts to vocalize or verbally respond to questions, didn't acknowledge family's present. He was receptive to ice chips/spoon at his lips but had difficulty opening his lips to release the spoon. Pt with slightly prolonged oral phase and intermittent increase in RR during 4 trials of ice chips. Out of an abundance of caution trials were stopped and education completed with pt's family regarding PO readiness, high aspiration risk and ST POC. given family's report of pt decline prior to admission, would consider referral to Palliative Care. ST to follow for PO readiness. SLP Visit Diagnosis: Dysphagia, unspecified (R13.10)    Aspiration Risk  Severe aspiration risk;Risk for inadequate nutrition/hydration    Diet Recommendation    NPO       Other Recommendations Oral Care Recommendations: Oral care QID;Oral care prior to ice chip/H20     Swallow Evaluation Recommendations Recommendations: NPO;Ice chips PRN after oral care Medication Administration: Via alternative means Supervision: Full assist for feeding Swallowing strategies  : Minimize environmental distractions;Slow rate;Small bites/sips Postural changes: Position pt fully upright for meals;Stay upright 30-60 min after meals Oral care recommendations: Oral care QID (4x/day);Oral care before ice chips/water   Assistance Recommended at Discharge    Functional Status Assessment Patient has had a recent decline in their functional status and/or demonstrates limited ability to make significant improvements in function in a reasonable and  predictable amount of time  Frequency and Duration min 2x/week  2 weeks       Prognosis Prognosis for  improved oropharyngeal function: Guarded Barriers to Reach Goals: Cognitive deficits;Time post onset;Severity of deficits      Swallow Study   General Date of Onset: 05/30/24 HPI: 89 year old male presenting to the hospital (05/30/2024) from his nursing facility with increased work of breathing and shortness of breath. Viral respiratory panel positive for metapneumovirus and rhinovirus.  Patient with elevated creatinine and sodium. Chest CT (05/30/2024) revealed There is multifocal bilateral airspace disease, most pronounced within the right upper, right lower, and left lower lobes, consistent with bilateral bronchopneumonia. Trace right pleural effusion. Pt is known to ST services for treatment of dysphagia from previous admissions. Family at bedside report more recent diet at [facility] was dysphagia 1 with thin liquids. They report unintentional weight loss and decreased PO consumption over the last month with increased coughing during consumption of thin liquids at facility. Previous Swallow Assessment: multiple Diet Prior to this Study: NPO Temperature Spikes Noted: No Respiratory Status: Room air History of Recent Intubation: No Behavior/Cognition: Doesn't follow directions Oral Cavity Assessment: Dry Oral Care Completed by SLP: Recent completion by staff Oral Cavity - Dentition: Edentulous Self-Feeding Abilities: Total assist Patient Positioning: Upright in bed Baseline Vocal Quality: Not observed Volitional Cough: Cognitively unable to elicit Volitional Swallow: Unable to elicit    Oral/Motor/Sensory Function Overall Oral Motor/Sensory Function:  (informally, no focal deficits observed)   Ice Chips Ice chips: Impaired Presentation: Spoon Oral Phase Impairments: Reduced lingual movement/coordination Oral Phase Functional Implications: Prolonged oral transit   Thin Liquid Thin Liquid: Not tested    Nectar Thick Nectar Thick Liquid: Not tested   Honey Thick Honey Thick  Liquid: Not tested   Puree Puree: Not tested   Solid     Solid: Not tested     Lucky Trotta B. Rubbie, M.S., CCC-SLP, Tree Surgeon Certified Brain Injury Specialist [facility]  [facility] Office [phone] Ascom [phone] Fax [phone]

## 2024-05-31 NOTE — Assessment & Plan Note (Signed)
Benadryl cream

## 2024-05-31 NOTE — Assessment & Plan Note (Signed)
 Unclear if viral or bacterial.  Has 2 viruses positive on viral respiratory panel but also very sick will treat with antibiotics.

## 2024-05-31 NOTE — Assessment & Plan Note (Signed)
-   Secondary to severe sepsis

## 2024-05-31 NOTE — Assessment & Plan Note (Signed)
Secondary to sepsis with systolic in the 90s Hold antihypertensives and resume when appropriate  

## 2024-05-31 NOTE — Assessment & Plan Note (Signed)
On chronic steroids. 

## 2024-05-31 NOTE — Assessment & Plan Note (Addendum)
 Present on admission with multifocal pneumonia, acute respiratory failure, acute kidney injury, hypotension, tachycardia and tachypnea.  Patient on aggressive antibiotics with Rocephin , Zithromax  and Zyvox .  Given stress dose steroid.

## 2024-05-31 NOTE — Progress Notes (Signed)
 Progress Note   Patient: Nicholas Gross FMW:[id number] DOB: October 02, 1927 DOA: 05/30/2024     1 DOS: the patient was seen and examined on 05/31/2024   Brief hospital course: 89 year old male presenting to the hospital from his nursing facility with increased work of breathing and shortness of breath.   Patient is disoriented and unable to provide any meaningful history.  Collateral history was obtained through chart review, discussion with ED physician, and conversation with family members (sister and niece).   Family reports that the patient had symptoms of shortness of breath and oxygen requirements around a week ago that improved a few days ago only to worsen over the past couple of days.  They report that he was noted to have increased work of breathing and increased oxygen requirements at his nursing facility prompting transfer to the emergency department.  Family reported the patient has not had similar symptoms recently and he is not oxygen dependent.  They report that he is mostly bedbound and sometimes is in a wheelchair.  He requires help with almost all of his activities of daily living.  He is able to hold a conversation but is not as verbal as he was in the past.   On presentation to the ED, he was noted to have increased work of breathing requiring oxygen support via nasal cannula.  His blood work shows an acute kidney injury with creatinine of 2.78 and a BUN of 67.  He has elevated liver function tests, hypernatremia (149), and lactic acidosis of 4.4.  Chest x-ray showed right lower lobe pneumonia.  COVID, influenza, and RSV PCR was negative.  Blood cultures were drawn and the patient was ordered IV antibiotics with ceftriaxone  and azithromycin  as well as IV fluids.   Patient is DNR/DNI based on his wishes and a signed MOLST form. ICU team was consulted for consideration of admission to the ICU.  12/9.  Patient able to follow some simple commands like opening his mouth and wiggling his  toes.  Mental status still not back to baseline.  Unable to pass swallow eval.  Will get palliative care consultation.  On empiric antibiotics for pneumonia.  Viral respiratory panel positive for metapneumovirus and rhinovirus.  Patient with elevated creatinine and sodium.  Assessment and Plan: * Severe sepsis (HCC) Present on admission with multifocal pneumonia, acute respiratory failure, acute kidney injury, hypotension, tachycardia and tachypnea.  Patient on aggressive antibiotics with Rocephin , Zithromax  and Zyvox .  Given stress dose steroid.  Multifocal pneumonia Unclear if viral or bacterial.  Has 2 viruses positive on viral respiratory panel but also very sick will treat with antibiotics.  Acute respiratory failure with hypoxia (HCC) Had a pulse ox of 81% on 5 L.  Taper oxygen as needed.  Metapneumovirus and rhinovirus on respiratory panel plus pneumonia.  AKI (acute kidney injury) Creatinine 2.32 today, creatinine 2.78 on presentation.  On gentle fluids.  Acute metabolic encephalopathy Unable to pass swallow eval today.  Right arm contracted will get CT scan of the head.  Bullous pemphigoid (HCC) On chronic steroids  Hypernatremia IV fluid hydration  High anion gap metabolic acidosis Likely secondary to infection and acute kidney injury.  Elevated liver function tests Continue to monitor  Thrombocytopenia Secondary to severe sepsis  Iron  deficiency anemia Hemoglobin down to 7.8 with IV fluid hydration.  May end up needing a blood transfusion during the hospital course.  Itching Benadryl  cream        Subjective: Patient unable to give any history.  Able  to follow some simple commands.  Admitted with severe sepsis and multifocal pneumonia and acute respiratory failure and acute kidney injury.  Physical Exam: Vitals:   05/31/24 0900 05/31/24 1000 05/31/24 1040 05/31/24 1100  BP: (!) 105/59 (!) 93/52 92/61 124/72  Pulse: 67 70 69   Resp:  (!) 23 (!) 22 (!) 28   Temp:    (!) 97.4 F (36.3 C)  TempSrc:      SpO2: 98% 94% 94% 95%  Weight:       Physical Exam HENT:     Head: Normocephalic.  Eyes:     General: Lids are normal.  Cardiovascular:     Rate and Rhythm: Normal rate and regular rhythm.     Heart sounds: Normal heart sounds, S1 normal and S2 normal.  Pulmonary:     Breath sounds: Examination of the right-lower field reveals decreased breath sounds. Examination of the left-lower field reveals decreased breath sounds. Decreased breath sounds present. No wheezing, rhonchi or rales.  Abdominal:     Palpations: Abdomen is soft.     Tenderness: There is no abdominal tenderness.  Musculoskeletal:     Right lower leg: No swelling.     Left lower leg: No swelling.  Skin:    General: Skin is warm.     Findings: No rash.  Neurological:     Mental Status: He is lethargic.     Data Reviewed: Viral respiratory panel positive for metapneumovirus and rhinovirus MRSA PCR positive Sodium 115, creatinine 2.32, CO2 15, anion gap 16, phosphorus and magnesium  normal range White blood cell count 7.1, hemoglobin 7.8, platelet count 125  Family Communication: Spoke with sister at the bedside and niece on the phone  Disposition: Status is: Inpatient Remains inpatient appropriate because: Patient is very sick high mortality with multiorgan failure and severe sepsis.  Will get palliative care consultation.  If he does not improve may be a candidate for hospice.  Planned Discharge Destination: To be determined    Time spent: 28 minutes  Author: Charlie Patterson, MD 05/31/2024 12:59 PM  For on call review [email or web address].

## 2024-06-01 ENCOUNTER — Inpatient Hospital Stay (HOSPITAL_COMMUNITY)
Admit: 2024-06-01 | Discharge: 2024-06-01 | Disposition: A | Discharge status: Home / Self Care | Attending: Infectious Diseases | Admitting: Infectious Diseases

## 2024-06-01 DIAGNOSIS — N189 Chronic kidney disease, unspecified: Secondary | ICD-10-CM | POA: Diagnosis not present

## 2024-06-01 DIAGNOSIS — L120 Bullous pemphigoid: Secondary | ICD-10-CM

## 2024-06-01 DIAGNOSIS — J9601 Acute respiratory failure with hypoxia: Secondary | ICD-10-CM

## 2024-06-01 DIAGNOSIS — J15212 Pneumonia due to Methicillin resistant Staphylococcus aureus: Secondary | ICD-10-CM | POA: Diagnosis not present

## 2024-06-01 DIAGNOSIS — R7881 Bacteremia: Secondary | ICD-10-CM | POA: Diagnosis not present

## 2024-06-01 DIAGNOSIS — R7989 Other specified abnormal findings of blood chemistry: Secondary | ICD-10-CM

## 2024-06-01 DIAGNOSIS — N179 Acute kidney failure, unspecified: Secondary | ICD-10-CM

## 2024-06-01 DIAGNOSIS — E43 Unspecified severe protein-calorie malnutrition: Secondary | ICD-10-CM | POA: Diagnosis not present

## 2024-06-01 DIAGNOSIS — A419 Sepsis, unspecified organism: Secondary | ICD-10-CM

## 2024-06-01 DIAGNOSIS — E870 Hyperosmolality and hypernatremia: Secondary | ICD-10-CM

## 2024-06-01 DIAGNOSIS — G9341 Metabolic encephalopathy: Secondary | ICD-10-CM

## 2024-06-01 DIAGNOSIS — R6520 Severe sepsis without septic shock: Secondary | ICD-10-CM

## 2024-06-01 DIAGNOSIS — J188 Other pneumonia, unspecified organism: Secondary | ICD-10-CM | POA: Diagnosis not present

## 2024-06-01 DIAGNOSIS — B9781 Human metapneumovirus as the cause of diseases classified elsewhere: Secondary | ICD-10-CM | POA: Diagnosis not present

## 2024-06-01 DIAGNOSIS — Z7189 Other specified counseling: Secondary | ICD-10-CM | POA: Diagnosis not present

## 2024-06-01 LAB — BLOOD CULTURE ID PANEL (REFLEXED) - BCID2

## 2024-06-01 LAB — BASIC METABOLIC PANEL WITH GFR
Anion gap: 13 (ref 5–15)
BUN: 64 mg/dL — ABNORMAL HIGH (ref 8–23)
CO2: 15 mmol/L — ABNORMAL LOW (ref 22–32)
Calcium: 8.4 mg/dL — ABNORMAL LOW (ref 8.9–10.3)
Chloride: 125 mmol/L — ABNORMAL HIGH (ref 98–111)
Creatinine, Ser: 2.53 mg/dL — ABNORMAL HIGH (ref 0.61–1.24)
GFR, Estimated: 23 mL/min — ABNORMAL LOW (ref >60)
Glucose, Bld: 129 mg/dL — ABNORMAL HIGH (ref 70–99)
Potassium: 3.8 mmol/L (ref 3.5–5.1)
Sodium: 153 mmol/L — ABNORMAL HIGH (ref 135–145)

## 2024-06-01 LAB — GLUCOSE, CAPILLARY
Glucose-Capillary: 103 mg/dL — ABNORMAL HIGH (ref 70–99)
Glucose-Capillary: 111 mg/dL — ABNORMAL HIGH (ref 70–99)
Glucose-Capillary: 117 mg/dL — ABNORMAL HIGH (ref 70–99)
Glucose-Capillary: 117 mg/dL — ABNORMAL HIGH (ref 70–99)
Glucose-Capillary: 122 mg/dL — ABNORMAL HIGH (ref 70–99)
Glucose-Capillary: 130 mg/dL — ABNORMAL HIGH (ref 70–99)
Glucose-Capillary: 156 mg/dL — ABNORMAL HIGH (ref 70–99)

## 2024-06-01 LAB — CBC
HCT: 27.6 % — ABNORMAL LOW (ref 39.0–52.0)
Hemoglobin: 8.5 g/dL — ABNORMAL LOW (ref 13.0–17.0)
MCH: 32.8 pg (ref 26.0–34.0)
MCHC: 30.8 g/dL (ref 30.0–36.0)
MCV: 106.6 fL — ABNORMAL HIGH (ref 80.0–100.0)
Platelets: 145 K/uL — ABNORMAL LOW (ref 150–400)
RBC: 2.59 MIL/uL — ABNORMAL LOW (ref 4.22–5.81)
RDW: 15 % (ref 11.5–15.5)
WBC: 8.4 K/uL (ref 4.0–10.5)
nRBC: 0 % (ref 0.0–0.2)

## 2024-06-01 LAB — TYPE AND SCREEN
ABO/RH(D): A NEG
Antibody Screen: NEGATIVE

## 2024-06-01 MED ORDER — DAPTOMYCIN-SODIUM CHLORIDE 500-0.9 MG/50ML-% IV SOLN
8.0000 mg/kg | INTRAVENOUS | Status: DC
  Administered 2024-06-01: 500 mg via INTRAVENOUS
  Filled 2024-06-01 (×2): qty 50

## 2024-06-01 MED ORDER — DEXTROSE IN LACTATED RINGERS 5 % IV SOLN: INTRAVENOUS | Status: DC

## 2024-06-01 NOTE — Consult Note (Signed)
 Consultation Note Date: 06/01/2024 at 0900  Patient Name: Nicholas Gross  DOB: 25-Nov-1927  MRN: [id number]  Age / Sex: 89 y.o., male  PCP: Vicci [name], DO Referring Physician: Marsa Edelman, DO  HPI/Patient Profile: 89 y.o. male  with past medical history of  CAD, HTN, CVA (right side residual), dyslipidemia, former smoker, mesenteric mass, admitted on 05/30/2024 from [facility] facility with increased work of breathing and shortness of breath.  Family endorses patient was having symptoms of shortness of breath requiring increased oxygen approximately 1 week prior to patient.  However, a few days leading to admission breathing worsened.  On presentation to the ED, patient was noted to have increased work of breathing requiring oxygen support via nasal cannula.  Blood work revealed AKI with creatinine of 2.7 and hemoglobin of 6 7.  Patient's liver functions test were elevated-ALT 344 and AST-167.  Chest x-ray revealed right lower lobe pneumonia.  COVID, influenza and RSV PCR were negative.  Blood cultures were drawn and patient was given IV antibiotics (ceftriaxone  and azithromycin ) and IVF.  Patient is currently being treated for multifocal pneumonia, acute respiratory failure, AKI, hypotension, tachycardia, hyponatremia (sodium of 149), and acute metabolic encephalopathy.  SLP was consulted and patient unable to pass swallow eval.  N.p.o. with monitored ice chips is current recommendation.  ENT was consulted to the blood patient and family with goals of care discussions.  Of note, patient is familiar to me as I cared for patient during his May 2025 hospitalization.  Clinical Assessment and Goals of Care: Extensive chart review completed prior to meeting patient including labs, vital signs, imaging, progress notes, orders, and available advanced directive documents from current and  previous encounters. I then met with patient, his sister Almarie, and his niece/next of kin decision maker Tammy at bedside to discuss diagnosis prognosis, GOC, EOL wishes, disposition and options.  I introduced Palliative Medicine as specialized medical care for people living with serious illness. It focuses on providing relief from the symptoms and stress of a serious illness. The goal is to improve quality of life for both the patient and the family.  We discussed a brief life review of the patient.  He is a widow and never had children.  He works in the [employer] of his adult life.  He has 1 of 6 children.  He and his sister are the last remaining siblings.  He has resided in the long-term care portion of [facility] for approximately the last year.  As far as functional and nutritional status family endorses a significant decline over the last week.  They endorse patient was requiring assistance with eating/feeding and was unable to be as mobile prior to admission.  We discussed patient's current illness.  Lengthy discussion regarding AKI, elevated liver enzymes, hypernatremia, multifocal pneumonia.reviewed that these acute issues are superimposed on chronic, ongoing comorbidities-CVA, debility, advanced age.  Reviewed how acute and chronic issues contributing to patient's current functional, nutritional, and cognitive status.  Reviewed SLP recommendations of n.p.o. diet  until patient is more awake/alert to appropriately evaluate safely.Reviewed that SLP recommendations are currently NPO since patient is now awake/alerty enough to safely/appropriately test PO.   I attempted to elicit values and goals of care important to the patient.  Patient's sister listed in the steamy ER competent that patient would never want his life to be prolonged artificially.  Specifically, they remain in agreement with DNR with limited interventions.  They also shared patient would never be accepting of  artificial hydration/nutrition-PEG tube feedings.  Reviewed that patient is currently having hydration that with IVF.  However, if he continues to not be able to participate in swallow evaluations and remains unable to take his p.o., his nutritional, significant issues contribute to end-of-life approaching earlier than expected.  They share understanding and appreciation for the medical update.  Discussed that antibiotics are being given to address pneumonia.  Additionally, close monitoring of his electrolytes and kidney and liver function will continue.  Watchful waiting and treating the treatable reviewed.  Discussed tumor mortality, remaining hopeful, but also realistic.  Discussed best case/worst-case scenarios.  Reviewed that should patient not show signs of improvement, comfort measures can be considered.  Comfort focused care discussed in detail.  Family was appreciative of knowing all options.  They are agreement to continue to watch and wait.  PMT will continue to follow and support.  Plan remains to see how patient improves or declines over the next 24-48 hours.  No change to plan of care at this time.  After visiting with the patient, reviewed TOC's note wherein they confirmed patient can return to [facility] at discharge.  PMT will continue to follow and support.  Primary Decision Maker NEXT OF KIN  Physical Exam Vitals reviewed.  Constitutional:      General: He is not in acute distress.    Appearance: He is normal weight.  HENT:     Head: Normocephalic.     Mouth/Throat:     Mouth: Mucous membranes are moist.  Pulmonary:     Effort: Pulmonary effort is normal.  Abdominal:     Palpations: Abdomen is soft.  Musculoskeletal:     Comments: Generalized weakness  Neurological:     Comments: Non verbal     Palliative Assessment/Data: 30%     Thank you for this consult. Palliative medicine will continue to follow and assist holistically.   75 minute visit  includes: Detailed review of medical records (labs, imaging, vital signs), medically appropriate exam (mental status, respiratory, cardiac, skin), discussed with treatment team, counseling and educating patient, family and staff, documenting clinical information, medication management and coordination of care.  Signed by: Lamarr Gunner, DNP, FNP-BC Palliative Medicine   Please contact Palliative Medicine Team providers via [facility] for questions and concerns.

## 2024-06-01 NOTE — Consult Note (Signed)
 Infectious Disease     Reason for Consult:MRSA bacteremia and PNA    Referring Physician: Alexander, Natalie, DO  Date of Admission:  05/30/2024   Principal Problem:   Severe sepsis (HCC) Active Problems:   Acute metabolic encephalopathy   Hypernatremia   Bullous pemphigoid (HCC)   Acute respiratory failure with hypoxia (HCC)   AKI (acute kidney injury)   Multifocal pneumonia   High anion gap metabolic acidosis   Elevated liver function tests   Itching   Iron  deficiency anemia   Thrombocytopenia   HPI: Nicholas Gross is a 89 y.o. male with a history of multiple medical problems from facility December 8 with complaints of increased work of breathing and cough.  On admit he had acute kidney injury with creatinine up to 2.78 lactic acid 4.4 hypernatremia chest x-ray with right lower lobe pneumonia.  Respiratory panel was positive for metapneumovirus and rhinovirus.  Nasal MRSA PCR was positive he was started on azithromycin  and ceftriaxone  and linezolid .  Blood cultures were done and returned positive for MRSA.  CT scan of the chest showed multifocal bilateral bronchopneumonia.  Initially was febrile to 102.4 but is defervesced. Remains in the unit.  Minimally responsive. Past Medical History:  Diagnosis Date   CAD (coronary artery disease)    History of GI bleed 2008   History of tobacco use    17 pack years, quit around 1970   Hyperlipidemia    Hypertension    Overweight    Past Surgical History:  Procedure Laterality Date   CAROTID ENDARTERECTOMY Left 2010   CATARACT EXTRACTION     cypher stent  09/12/02   s/p cypher stent mid-LAD   HIP ARTHROPLASTY Left 10/29/2023   Procedure: HEMIARTHROPLASTY (BIPOLAR) HIP, POSTERIOR APPROACH FOR FRACTURE;  Surgeon: Edie Norleen PARAS, MD;  Location: [facility] ORS;  Service: Orthopedics;  Laterality: Left;   INTRAMEDULLARY (IM) NAIL INTERTROCHANTERIC Right 12/07/2023   Procedure: FIXATION, FRACTURE, INTERTROCHANTERIC, WITH INTRAMEDULLARY ROD;   Surgeon: Lorelle Hussar, MD;  Location: [facility] OR[facility];  Service: Orthopedics;  Laterality: Right;   TENDON REPAIR  1991   finger and arm   Social History   Tobacco Use   Smoking status: Former    Current packs/day: 0.00    Average packs/day: 1 pack/day for 17.0 years (17.0 ttl pk-yrs)    Types: Cigarettes    Start date: 06/24/1951    Quit date: 06/23/1968    Years since quitting: 55.9   Smokeless tobacco: Never  Vaping Use   Vaping status: Never Used  Substance Use Topics   Alcohol  use: No   Drug use: No   Family History  Problem Relation Age of Onset   Heart disease Father        possibly   Cancer Sister        breast   AAA (abdominal aortic aneurysm) Brother    Cancer Sister        lung    Allergies:  Allergies  Allergen Reactions   Other     Patient states that he is allergic to something that they place in the IV before they work on you   Simvastatin  Other (See Comments)    Myalgia     Current antibiotics: Antibiotics Given (last 72 hours)     Date/Time Action Medication Dose Rate   05/30/24 1236 New Bag/Given   cefTRIAXone  (ROCEPHIN ) 2 g in sodium chloride  0.9 % 100 mL IVPB 2 g 200 mL/hr   05/30/24 1429 New Bag/Given   azithromycin  (  ZITHROMAX ) 500 mg in sodium chloride  0.9 % 250 mL IVPB 500 mg 250 mL/hr   05/30/24 2201 New Bag/Given   linezolid  (ZYVOX ) IVPB 600 mg 600 mg 300 mL/hr   05/31/24 0951 New Bag/Given   linezolid  (ZYVOX ) IVPB 600 mg 600 mg 300 mL/hr   05/31/24 1134 New Bag/Given   cefTRIAXone  (ROCEPHIN ) 2 g in sodium chloride  0.9 % 100 mL IVPB 2 g 200 mL/hr   05/31/24 1319 New Bag/Given   azithromycin  (ZITHROMAX ) 500 mg in sodium chloride  0.9 % 250 mL IVPB 500 mg 250 mL/hr   05/31/24 2108 New Bag/Given   linezolid  (ZYVOX ) IVPB 600 mg 600 mg 300 mL/hr   06/01/24 9081 New Bag/Given   linezolid  (ZYVOX ) IVPB 600 mg 600 mg 300 mL/hr   06/01/24 1218 New Bag/Given   cefTRIAXone  (ROCEPHIN ) 2 g in sodium chloride  0.9 % 100 mL IVPB 2 g 200 mL/hr   06/01/24  1450 New Bag/Given   azithromycin  (ZITHROMAX ) 500 mg in sodium chloride  0.9 % 250 mL IVPB 500 mg 250 mL/hr       MEDICATIONS:  Chlorhexidine  Gluconate Cloth  6 each Topical Daily   clobetasol  ointment  1 Application Topical BID   diphenhydrAMINE -zinc  acetate   Topical BID   heparin   5,000 Units Subcutaneous Q8H   hydrocortisone  sod succinate (SOLU-CORTEF ) inj  50 mg Intravenous Q6H   insulin  aspart  0-6 Units Subcutaneous Q4H   mupirocin  ointment  1 Application Nasal BID    Review of Systems - 11 systems reviewed and negative per HPI   OBJECTIVE: Temp:  [97.1 F (36.2 C)-97.5 F (36.4 C)] 97.5 F (36.4 C) (12/10 1200) Pulse Rate:  [47-100] 89 (12/10 1400) Resp:  [0-30] 20 (12/10 1400) BP: (93-128)/(55-79) 118/70 (12/10 1400) SpO2:  [94 %-99 %] 95 % (12/10 1400) Weight:  [57.5 kg] 57.5 kg (12/10 0500) Physical Exam  Constitutional: frail, opens eyes but does not answer questions  HENT: anicterc Mouth/Throat: Oropharynx is clear and dry. .  Cardiovascular: Reg, 2/6 sm Pulmonary/Chest: poor air movement Abdominal: Soft. Bowel sounds are normal.  Lymphadenopathy: He has no cervical adenopathy.  Neurological: opens eyes but does not answer questions  Skin: Skin is warm and dry. No rash noted. No erythema.      LABS: Results for orders placed or performed during the hospital encounter of 05/30/24 (from the past 48 hours)  Glucose, capillary     Status: None   Collection Time: 05/30/24  4:34 PM  Result Value Ref Range   Glucose-Capillary 87 70 - 99 mg/dL    Comment: Glucose reference range applies only to samples taken after fasting for at least 8 hours.  Comprehensive metabolic panel     Status: Abnormal   Collection Time: 05/30/24  8:11 PM  Result Value Ref Range   Sodium 151 (H) 135 - 145 mmol/L   Potassium 3.6 3.5 - 5.1 mmol/L   Chloride 120 (H) 98 - 111 mmol/L   CO2 15 (L) 22 - 32 mmol/L   Glucose, Bld 85 70 - 99 mg/dL    Comment: Glucose reference range applies  only to samples taken after fasting for at least 8 hours.   BUN 62 (H) 8 - 23 mg/dL   Creatinine, Ser 7.56 (H) 0.61 - 1.24 mg/dL   Calcium  7.9 (L) 8.9 - 10.3 mg/dL   Total Protein 4.5 (L) 6.5 - 8.1 g/dL   Albumin 2.5 (L) 3.5 - 5.0 g/dL   AST 832 (H) 15 - 41 U/L  ALT 344 (H) 0 - 44 U/L   Alkaline Phosphatase 68 38 - 126 U/L   Total Bilirubin 0.5 0.0 - 1.2 mg/dL   GFR, Estimated 24 (L) >60 mL/min    Comment: (NOTE) Calculated using the CKD-EPI Creatinine Equation (2021)    Anion gap 17 (H) 5 - 15    Comment: Performed at [facility], [address] [place], [place] [address]  Glucose, capillary     Status: None   Collection Time: 05/30/24  8:49 PM  Result Value Ref Range   Glucose-Capillary 79 70 - 99 mg/dL    Comment: Glucose reference range applies only to samples taken after fasting for at least 8 hours.  Glucose, capillary     Status: Abnormal   Collection Time: 05/30/24 11:40 PM  Result Value Ref Range   Glucose-Capillary 111 (H) 70 - 99 mg/dL    Comment: Glucose reference range applies only to samples taken after fasting for at least 8 hours.  Basic metabolic panel with GFR     Status: Abnormal   Collection Time: 05/31/24  3:13 AM  Result Value Ref Range   Sodium 150 (H) 135 - 145 mmol/L   Potassium 3.6 3.5 - 5.1 mmol/L   Chloride 119 (H) 98 - 111 mmol/L   CO2 15 (L) 22 - 32 mmol/L   Glucose, Bld 122 (H) 70 - 99 mg/dL    Comment: Glucose reference range applies only to samples taken after fasting for at least 8 hours.   BUN 61 (H) 8 - 23 mg/dL   Creatinine, Ser 7.67 (H) 0.61 - 1.24 mg/dL   Calcium  7.9 (L) 8.9 - 10.3 mg/dL   GFR, Estimated 25 (L) >60 mL/min    Comment: (NOTE) Calculated using the CKD-EPI Creatinine Equation (2021)    Anion gap 16 (H) 5 - 15    Comment: Performed at [facility], [address] [place], [place] [address]  Magnesium      Status: None   Collection Time: 05/31/24  3:13 AM  Result Value Ref Range   Magnesium  2.2 1.7  - 2.4 mg/dL    Comment: Performed at [facility], [address] [place], [place] [address]  Phosphorus     Status: None   Collection Time: 05/31/24  3:13 AM  Result Value Ref Range   Phosphorus 4.6 2.5 - 4.6 mg/dL    Comment: Performed at [facility], [address] [place], [place] [address]  CBC     Status: Abnormal   Collection Time: 05/31/24  3:13 AM  Result Value Ref Range   WBC 7.1 4.0 - 10.5 K/uL   RBC 2.30 (L) 4.22 - 5.81 MIL/uL   Hemoglobin 7.8 (L) 13.0 - 17.0 g/dL    Comment: REPEATED TO VERIFY   HCT 24.3 (L) 39.0 - 52.0 %   MCV 105.7 (H) 80.0 - 100.0 fL   MCH 33.9 26.0 - 34.0 pg   MCHC 32.1 30.0 - 36.0 g/dL   RDW 84.7 88.4 - 84.4 %   Platelets 125 (L) 150 - 400 K/uL   nRBC 0.0 0.0 - 0.2 %    Comment: Performed at [facility], [address] [place], [place] [address]  Lactic acid, plasma     Status: None   Collection Time: 05/31/24  3:13 AM  Result Value Ref Range   Lactic Acid, Venous 1.5 0.5 - 1.9 mmol/L    Comment: Performed at [facility], [address] [place], [place]  [address]  Glucose, capillary     Status: Abnormal   Collection Time: 05/31/24  3:31 AM  Result Value Ref Range   Glucose-Capillary 110 (H) 70 - 99 mg/dL    Comment: Glucose reference range applies only to samples taken after fasting for at least 8 hours.  Glucose, capillary     Status: Abnormal   Collection Time: 05/31/24  7:45 AM  Result Value Ref Range   Glucose-Capillary 120 (H) 70 - 99 mg/dL    Comment: Glucose reference range applies only to samples taken after fasting for at least 8 hours.  Glucose, capillary     Status: Abnormal   Collection Time: 05/31/24 11:26 AM  Result Value Ref Range   Glucose-Capillary 154 (H) 70 - 99 mg/dL    Comment: Glucose reference range applies only to samples taken after fasting for at least 8 hours.  Glucose, capillary     Status: Abnormal   Collection Time: 05/31/24  3:34 PM  Result Value Ref Range    Glucose-Capillary 121 (H) 70 - 99 mg/dL    Comment: Glucose reference range applies only to samples taken after fasting for at least 8 hours.  Glucose, capillary     Status: Abnormal   Collection Time: 05/31/24  7:22 PM  Result Value Ref Range   Glucose-Capillary 131 (H) 70 - 99 mg/dL    Comment: Glucose reference range applies only to samples taken after fasting for at least 8 hours.  Glucose, capillary     Status: Abnormal   Collection Time: 05/31/24 11:21 PM  Result Value Ref Range   Glucose-Capillary 154 (H) 70 - 99 mg/dL    Comment: Glucose reference range applies only to samples taken after fasting for at least 8 hours.  Glucose, capillary     Status: Abnormal   Collection Time: 06/01/24  3:22 AM  Result Value Ref Range   Glucose-Capillary 103 (H) 70 - 99 mg/dL    Comment: Glucose reference range applies only to samples taken after fasting for at least 8 hours.  Glucose, capillary     Status: Abnormal   Collection Time: 06/01/24  4:05 AM  Result Value Ref Range   Glucose-Capillary 111 (H) 70 - 99 mg/dL    Comment: Glucose reference range applies only to samples taken after fasting for at least 8 hours.  Basic metabolic panel     Status: Abnormal   Collection Time: 06/01/24  4:11 AM  Result Value Ref Range   Sodium 153 (H) 135 - 145 mmol/L   Potassium 3.8 3.5 - 5.1 mmol/L    Comment: HEMOLYSIS AT THIS LEVEL MAY AFFECT RESULT   Chloride 125 (H) 98 - 111 mmol/L   CO2 15 (L) 22 - 32 mmol/L   Glucose, Bld 129 (H) 70 - 99 mg/dL    Comment: Glucose reference range applies only to samples taken after fasting for at least 8 hours.   BUN 64 (H) 8 - 23 mg/dL   Creatinine, Ser 7.46 (H) 0.61 - 1.24 mg/dL   Calcium  8.4 (L) 8.9 - 10.3 mg/dL   GFR, Estimated 23 (L) >60 mL/min    Comment: (NOTE) Calculated using the CKD-EPI Creatinine Equation (2021)    Anion gap 13 5 - 15    Comment: Performed at [facility], [address] [place], [place] [address]  Type and screen  [facility]     Status: None   Collection Time: 06/01/24  6:25 AM  Result Value Ref Range  ABO/RH(D) A NEG    Antibody Screen NEG    Sample Expiration      06/04/2024,2359 Performed at [facility], [address] [place], [place] [address]   CBC     Status: Abnormal   Collection Time: 06/01/24  6:25 AM  Result Value Ref Range   WBC 8.4 4.0 - 10.5 K/uL   RBC 2.59 (L) 4.22 - 5.81 MIL/uL   Hemoglobin 8.5 (L) 13.0 - 17.0 g/dL   HCT 72.3 (L) 60.9 - 47.9 %   MCV 106.6 (H) 80.0 - 100.0 fL   MCH 32.8 26.0 - 34.0 pg   MCHC 30.8 30.0 - 36.0 g/dL   RDW 84.9 88.4 - 84.4 %   Platelets 145 (L) 150 - 400 K/uL   nRBC 0.0 0.0 - 0.2 %    Comment: Performed at [facility], [address] [place], [place] [address]  Glucose, capillary     Status: Abnormal   Collection Time: 06/01/24  8:01 AM  Result Value Ref Range   Glucose-Capillary 117 (H) 70 - 99 mg/dL    Comment: Glucose reference range applies only to samples taken after fasting for at least 8 hours.  Glucose, capillary     Status: Abnormal   Collection Time: 06/01/24 11:41 AM  Result Value Ref Range   Glucose-Capillary 130 (H) 70 - 99 mg/dL    Comment: Glucose reference range applies only to samples taken after fasting for at least 8 hours.   No components found for: ESR, C REACTIVE PROTEIN MICRO: Recent Results (from the past 720 hours)  Blood Culture (routine x 2)     Status: None (Preliminary result)   Collection Time: 05/30/24 10:54 AM   Specimen: BLOOD  Result Value Ref Range Status   Specimen Description BLOOD BLOOD LEFT ARM  Final   Special Requests   Final    BOTTLES DRAWN AEROBIC AND ANAEROBIC Blood Culture results may not be optimal due to an inadequate volume of blood received in culture bottles   Culture   Final    NO GROWTH 2 DAYS Performed at [facility], [address], [place], [place] [address]    Report Status PENDING  Incomplete  Blood Culture (routine  x 2)     Status: None (Preliminary result)   Collection Time: 05/30/24 10:54 AM   Specimen: BLOOD  Result Value Ref Range Status   Specimen Description BLOOD BLOOD LEFT HAND  Final   Special Requests   Final    BOTTLES DRAWN AEROBIC AND ANAEROBIC Blood Culture results may not be optimal due to an inadequate volume of blood received in culture bottles   Culture  Setup Time   Final    Organism ID to follow GRAM POSITIVE COCCI ANAEROBIC BOTTLE ONLY CRITICAL RESULT CALLED TO, READ BACK BY AND VERIFIED WITH: WILL ANDERSON 06/01/24 1449 KLW Performed at [facility], [address] [place], [place] [address]    Culture GRAM POSITIVE COCCI  Final   Report Status PENDING  Incomplete  Resp panel by RT-PCR (RSV, Flu A&B, Covid) Anterior Nasal Swab     Status: None   Collection Time: 05/30/24 10:54 AM   Specimen: Anterior Nasal Swab  Result Value Ref Range Status   SARS Coronavirus 2 by RT PCR NEGATIVE NEGATIVE Final    Comment: (NOTE) SARS-CoV-2 target nucleic acids are NOT DETECTED.  The SARS-CoV-2 RNA is generally detectable in upper respiratory specimens during the acute phase of infection. The lowest concentration of  SARS-CoV-2 viral copies this assay can detect is 138 copies/mL. A negative result does not preclude SARS-Cov-2 infection and should not be used as the sole basis for treatment or other patient management decisions. A negative result may occur with  improper specimen collection/handling, submission of specimen other than nasopharyngeal swab, presence of viral mutation(s) within the areas targeted by this assay, and inadequate number of viral copies(<138 copies/mL). A negative result must be combined with clinical observations, patient history, and epidemiological information. The expected result is Negative.  Fact Sheet for Patients:  bloggercourse.com  Fact Sheet for Healthcare Providers:   seriousbroker.it  This test is no t yet approved or cleared by the [place]  FDA and  has been authorized for detection and/or diagnosis of SARS-CoV-2 by FDA under an Emergency Use Authorization (EUA). This EUA will remain  in effect (meaning this test can be used) for the duration of the COVID-19 declaration under Section 564(b)(1) of the Act, 21 U.S.C.section 360bbb-3(b)(1), unless the authorization is terminated  or revoked sooner.       Influenza A by PCR NEGATIVE NEGATIVE Final   Influenza B by PCR NEGATIVE NEGATIVE Final    Comment: (NOTE) The Xpert Xpress SARS-CoV-2/FLU/RSV plus assay is intended as an aid in the diagnosis of influenza from Nasopharyngeal swab specimens and should not be used as a sole basis for treatment. Nasal washings and aspirates are unacceptable for Xpert Xpress SARS-CoV-2/FLU/RSV testing.  Fact Sheet for Patients: bloggercourse.com  Fact Sheet for Healthcare Providers: seriousbroker.it  This test is not yet approved or cleared by the [place]  FDA and has been authorized for detection and/or diagnosis of SARS-CoV-2 by FDA under an Emergency Use Authorization (EUA). This EUA will remain in effect (meaning this test can be used) for the duration of the COVID-19 declaration under Section 564(b)(1) of the Act, 21 U.S.C. section 360bbb-3(b)(1), unless the authorization is terminated or revoked.     Resp Syncytial Virus by PCR NEGATIVE NEGATIVE Final    Comment: (NOTE) Fact Sheet for Patients: bloggercourse.com  Fact Sheet for Healthcare Providers: seriousbroker.it  This test is not yet approved or cleared by the [place]  FDA and has been authorized for detection and/or diagnosis of SARS-CoV-2 by FDA under an Emergency Use Authorization (EUA). This EUA will remain in effect (meaning this test can be used) for  the duration of the COVID-19 declaration under Section 564(b)(1) of the Act, 21 U.S.C. section 360bbb-3(b)(1), unless the authorization is terminated or revoked.  Performed at [facility], [address] [place], [place] [address]   Blood Culture ID Panel (Reflexed)     Status: Abnormal   Collection Time: 05/30/24 10:54 AM  Result Value Ref Range Status   Enterococcus faecalis NOT DETECTED NOT DETECTED Final   Enterococcus Faecium NOT DETECTED NOT DETECTED Final   Listeria monocytogenes NOT DETECTED NOT DETECTED Final   Staphylococcus species DETECTED (A) NOT DETECTED Final    Comment: CRITICAL RESULT CALLED TO, READ BACK BY AND VERIFIED WITH: WILL ANDERSON 06/01/24 1449 KLW    Staphylococcus aureus (BCID) DETECTED (A) NOT DETECTED Final    Comment: Methicillin (oxacillin)-resistant Staphylococcus aureus (MRSA). MRSA is predictably resistant to beta-lactam antibiotics (except ceftaroline). Preferred therapy is vancomycin  unless clinically contraindicated. Patient requires contact precautions if  hospitalized. CRITICAL RESULT CALLED TO, READ BACK BY AND VERIFIED WITH: WILL ANDERSON 06/01/24 1449 KLW    Staphylococcus epidermidis NOT DETECTED NOT DETECTED Final   Staphylococcus lugdunensis NOT DETECTED NOT DETECTED Final   Streptococcus species NOT  DETECTED NOT DETECTED Final   Streptococcus agalactiae NOT DETECTED NOT DETECTED Final   Streptococcus pneumoniae NOT DETECTED NOT DETECTED Final   Streptococcus pyogenes NOT DETECTED NOT DETECTED Final   A.calcoaceticus-baumannii NOT DETECTED NOT DETECTED Final   Bacteroides fragilis NOT DETECTED NOT DETECTED Final   Enterobacterales NOT DETECTED NOT DETECTED Final   Enterobacter cloacae complex NOT DETECTED NOT DETECTED Final   Escherichia coli NOT DETECTED NOT DETECTED Final   Klebsiella aerogenes NOT DETECTED NOT DETECTED Final   Klebsiella oxytoca NOT DETECTED NOT DETECTED Final   Klebsiella pneumoniae NOT DETECTED NOT  DETECTED Final   Proteus species NOT DETECTED NOT DETECTED Final   Salmonella species NOT DETECTED NOT DETECTED Final   Serratia marcescens NOT DETECTED NOT DETECTED Final   Haemophilus influenzae NOT DETECTED NOT DETECTED Final   Neisseria meningitidis NOT DETECTED NOT DETECTED Final   Pseudomonas aeruginosa NOT DETECTED NOT DETECTED Final   Stenotrophomonas maltophilia NOT DETECTED NOT DETECTED Final   Candida albicans NOT DETECTED NOT DETECTED Final   Candida auris NOT DETECTED NOT DETECTED Final   Candida glabrata NOT DETECTED NOT DETECTED Final   Candida krusei NOT DETECTED NOT DETECTED Final   Candida parapsilosis NOT DETECTED NOT DETECTED Final   Candida tropicalis NOT DETECTED NOT DETECTED Final   Cryptococcus neoformans/gattii NOT DETECTED NOT DETECTED Final   Meth resistant mecA/C and MREJ DETECTED (A) NOT DETECTED Final    Comment: CRITICAL RESULT CALLED TO, READ BACK BY AND VERIFIED WITH: WILL ANDERSON 06/01/24 1449 KLW Performed at [facility], [address] [place], [place] [address]   MRSA Next Gen by PCR, Nasal     Status: Abnormal   Collection Time: 05/30/24 12:49 PM   Specimen: Nasal Mucosa; Nasal Swab  Result Value Ref Range Status   MRSA by PCR Next Gen DETECTED (A) NOT DETECTED Final    Comment: RESULT CALLED TO, READ BACK BY AND VERIFIED WITH: MYRA FLOWERS 1813 05/30/24 MU (NOTE) The GeneXpert MRSA Assay (FDA approved for NASAL specimens only), is one component of a comprehensive MRSA colonization surveillance program. It is not intended to diagnose MRSA infection nor to guide or monitor treatment for MRSA infections. Test performance is not FDA approved in patients less than 4 years old. Performed at [facility], [address] [place], [place] [address]   Respiratory (~20 pathogens) panel by PCR     Status: Abnormal   Collection Time: 05/30/24  1:55 PM   Specimen: Nasopharyngeal Swab; Respiratory  Result Value Ref Range Status    Adenovirus NOT DETECTED NOT DETECTED Final   Coronavirus 229E NOT DETECTED NOT DETECTED Final    Comment: (NOTE) The Coronavirus on the Respiratory Panel, DOES NOT test for the novel  Coronavirus (2019 nCoV)    Coronavirus HKU1 NOT DETECTED NOT DETECTED Final   Coronavirus NL63 NOT DETECTED NOT DETECTED Final   Coronavirus OC43 NOT DETECTED NOT DETECTED Final   Metapneumovirus DETECTED (A) NOT DETECTED Final   Rhinovirus / Enterovirus DETECTED (A) NOT DETECTED Final   Influenza A NOT DETECTED NOT DETECTED Final   Influenza B NOT DETECTED NOT DETECTED Final   Parainfluenza Virus 1 NOT DETECTED NOT DETECTED Final   Parainfluenza Virus 2 NOT DETECTED NOT DETECTED Final   Parainfluenza Virus 3 NOT DETECTED NOT DETECTED Final   Parainfluenza Virus 4 NOT DETECTED NOT DETECTED Final   Respiratory Syncytial Virus NOT DETECTED NOT DETECTED Final   Bordetella pertussis NOT DETECTED NOT DETECTED Final   Bordetella Parapertussis NOT  DETECTED NOT DETECTED Final   Chlamydophila pneumoniae NOT DETECTED NOT DETECTED Final   Mycoplasma pneumoniae NOT DETECTED NOT DETECTED Final    Comment: Performed at [facility], [address] [place], [place] [address]    IMAGING: CT HEAD WO CONTRAST ( ) Result Date: 05/31/2024 EXAM: CT HEAD WITHOUT 05/31/2024 11:05:29 AM TECHNIQUE: CT of the head was performed without the administration of intravenous contrast. Automated exposure control, iterative reconstruction, and/or weight based adjustment of the mA/kV was utilized to reduce the radiation dose to as low as reasonably achievable. COMPARISON: 12/06/2023 CLINICAL HISTORY: Mental status change, unknown cause FINDINGS: BRAIN AND VENTRICLES: No acute intracranial hemorrhage. No mass effect or midline shift. No extra-axial fluid collection. No evidence of acute infarct. No hydrocephalus. Proportional prominence of ventricles and sulci, consistent with diffuse cerebral parenchymal volume loss. Periventricular  and subcortical white matter hypoattenuation, consistent with moderate chronic ischemic microvascular disease. Calcified atherosclerotic plaque within cavernous/supraclinoid internal carotid arteries. ORBITS: No acute abnormality. Bilateral lens replacements. SINUSES AND MASTOIDS: Mild scattered mucosal thickening in the ethmoid sinuses. Trace bilateral mastoid effusions. SOFT TISSUES AND SKULL: No acute skull fracture. No acute soft tissue abnormality. IMPRESSION: 1. No acute intracranial abnormality. 2. Diffuse cerebral parenchymal volume loss. 3. Moderate chronic ischemic microvascular disease. Electronically signed by: Donnice Mania MD 05/31/2024 10:53 PM EST RP Workstation: [id number]   CT CHEST WO CONTRAST Result Date: 05/30/2024 CLINICAL DATA:  Respiratory failure, pneumonia EXAM: CT CHEST WITHOUT CONTRAST TECHNIQUE: Multidetector CT imaging of the chest was performed following the standard protocol without IV contrast. RADIATION DOSE REDUCTION: This exam was performed according to the departmental dose-optimization program which includes automated exposure control, adjustment of the mA and/or kV according to patient size and/or use of iterative reconstruction technique. COMPARISON:  05/30/2024, 09/28/2023 FINDINGS: Cardiovascular: Unenhanced imaging of the heart is unremarkable without pericardial effusion. There is continued dense calcification of the mitral and aortic valves. 4.6 cm ascending thoracic aortic aneurysm is noted, unchanged by my measurements since previous study. There is diffuse atherosclerosis of the aorta and coronary vasculature. Assessment of the vascular lumen cannot be performed without intravenous contrast. Mediastinum/Nodes: Borderline enlarged mediastinal and hilar lymph nodes are likely reactive. Thyroid , trachea, and esophagus are unremarkable. Lungs/Pleura: There is multifocal bilateral airspace disease, most pronounced within the right upper, right lower, and left lower lobes,  consistent with bilateral bronchopneumonia. Trace right pleural effusion. No pneumothorax. Upper Abdomen: No acute abnormality. Musculoskeletal: No acute or destructive bony abnormalities. Multiple prior healed bilateral rib fractures. Chronic compression deformities at T5, T9, T10, and L1. Reconstructed images demonstrate no additional findings. IMPRESSION: 1. Multifocal bilateral bronchopneumonia as above. 2. Trace right parapneumonic effusion. 3. Reactive mediastinal and bilateral hilar lymph nodes. 4. 4.6 cm ascending thoracic aortic aneurysm. Recommend semi-annual imaging followup by CTA or MRA and referral to cardiothoracic surgery if not already obtained. This recommendation follows 2010 ACCF/AHA/AATS/ACR/ASA/SCA/SCAI/SIR/STS/SVM Guidelines for the Diagnosis and Management of Patients With Thoracic Aortic Disease. Circulation. 2010; 121: Z733-z630. Aortic aneurysm NOS (ICD10-I71.9) 5. Aortic Atherosclerosis (ICD10-I70.0). Coronary artery atherosclerosis. Electronically Signed   By: Ozell Daring M.D.   On: 05/30/2024 16:58   DG Chest Port 1 View Result Date: 05/30/2024 EXAM: 1 VIEW(S) XRAY OF THE CHEST 05/30/2024 11:04:00 AM COMPARISON: 12/06/2023 CLINICAL HISTORY: Questionable sepsis - evaluate for abnormality FINDINGS: LIMITATIONS/ARTIFACTS: Rightward rotation of the frontal projection reducing diagnostic sensitivity/specificity. Assessment complicated by the rightward rotation. LUNGS AND PLEURA: New substantial opacity at the right lung base without obscuration of the right heart border, presumably  this is a right lower lobe airspace opacity, although a diaphragmatic hernia could appear similarly. This could be further assessed with chest CT if clinically warranted. No pleural effusion. No pneumothorax. HEART AND MEDIASTINUM: Aortic atherosclerosis. No acute abnormality of the cardiac and mediastinal silhouettes. BONES AND SOFT TISSUES: No acute osseous abnormality. IMPRESSION: 1. Right lung base  opacity, favored right lower lobe airspace process over new diaphragmatic hernia; evaluation limited by rightward rotation, and chest CT may be considered if warranted. 2. Aortic atherosclerosis. Electronically signed by: Ryan Salvage MD 05/30/2024 12:03 PM EST RP Workstation: [id number]    Assessment:   Nicholas Gross is a 89 y.o. male admitted with cough shortness of breath found to have metapneumovirus and rhinovirus infection as well as likely MRSA bronchopneumonia and bacteremia. Source of the bacteremia is most likely the pneumonia.  Recommendations Repeat blood cultures - ordered  Check transthoracic echocardiogram - ordered  Will continue linezolid  for MRSA pneumonia as well as a ceftriaxone  and azithromycin . Will give a dose of daptomycin  which at his renal function will get every 48 hours for the bacteremia.  Thank you very much for allowing me to participate in the care of this patient. Please call with questions.   Alm SQUIBB. Epifanio, MD

## 2024-06-01 NOTE — Progress Notes (Signed)
 Speech Language Pathology Treatment: Dysphagia  Patient Details Name: Nicholas Gross MRN: [id number] DOB: 07-29-1927 Today's Date: 06/01/2024 Time: 8995-8987 SLP Time Calculation (min) (ACUTE ONLY): 8 min  Assessment / Plan / Recommendation Clinical Impression  Pt seen for ongoing PO readiness. [name] sister was at bedside. Pt laying with eyes half closed. His sister reported that he slept with his eyes open. Despite maximal multimodal stimulation, pt didn't arouse to this clinical research associate or his sister. His RR fluctuated between 25 and 30. At this time, pt continues to be inappropriate for PO trials. ST will follow x 1 additional session.     HPI HPI: 89 year old male presenting to the hospital (05/30/2024) from his nursing facility with increased work of breathing and shortness of breath. Viral respiratory panel positive for metapneumovirus and rhinovirus.  Patient with elevated creatinine and sodium. Chest CT (05/30/2024) revealed There is multifocal bilateral airspace disease, most pronounced within the right upper, right lower, and left lower lobes, consistent with bilateral bronchopneumonia. Trace right pleural effusion. Pt is known to ST services for treatment of dysphagia from previous admissions. Family at bedside report more recent diet at [facility] was dysphagia 1 with thin liquids. They report unintentional weight loss and decreased PO consumption over the last month with increased coughing during consumption of thin liquids at facility.  05/31/2024 Head CT 1. No acute intracranial abnormality.  2. Diffuse cerebral parenchymal volume loss.  3. Moderate chronic ischemic microvascular disease.      SLP Plan  Continue with current plan of care        Swallow Evaluation Recommendations   Recommendations: Ice chips PRN after oral care Medication Administration: Via alternative means Supervision: Full assist for feeding;Full supervision/cueing for swallowing strategies Postural changes:  Position pt fully upright for meals;Stay upright 30-60 min after meals Oral care recommendations: Oral care QID (4x/day);Oral care before ice chips/water     Recommendations                     Oral care QID;Oral care prior to ice chip/H20     Dysphagia, unspecified (R13.10)     Continue with current plan of care    .Jsaon Yoo B. Rubbie, M.S., CCC-SLP, Tree Surgeon Certified Brain Injury Specialist [facility]  [facility] Rehabilitation Services Office [phone] Ascom [phone] Fax [phone]

## 2024-06-01 NOTE — Care Management Important Message (Signed)
 Important Message  Patient Details  Name: Nicholas Gross MRN: [id number] Date of Birth: [birth year]   Important Message Given:  Yes - Medicare IM     Nicholas Gross 06/01/2024, 4:30 PM

## 2024-06-01 NOTE — Progress Notes (Signed)
 PROGRESS NOTE   Nicholas Gross   FMW:[id number] DOB: March 20, 1928  DOA: 05/30/2024 Date of Service: 06/01/24 which is hospital day 2  PCP: Vicci [name], [facility] course / significant events:   89 year old male presenting to the hospital from his nursing facility with AMS, SOB.   HPI: Family reports that the patient had symptoms of shortness of breath and oxygen requirements around a week ago that improved a few days ago only to worsen over the past couple of days.  They report that he was noted to have increased work of breathing and increased oxygen requirements at his nursing facility prompting transfer to the emergency department.  Family reported the patient has not had similar symptoms recently and he is not oxygen dependent.  They report that he is mostly bedbound and sometimes is in a wheelchair.  He requires help with almost all of his activities of daily living.  He is able to hold a conversation but is not as verbal as he was in the past.   12/08: On presentation to the ED, acute hypoxic resp faiol, AKI Cr 2.78 and a BUN of 67. Elevated LFT, hypernatremia (149), lactic acidosis of 4.4.  CXR RLL PNA. COVID, influenza, and RSV PCR was negative.  Blood cultures were drawn. Start IV antibiotics with ceftriaxone  and azithromycin . IV fluids. Patient is DNR/DNI based on his wishes and a signed MOLST form. ICU team was consulted for consideration of admission to the ICU in case needing pressors. 12/09: Hospitalist team assumes care. Patient able to follow some simple commands like opening his mouth and wiggling his toes.  Mental status still not back to baseline.  Unable to pass swallow eval.  Will get palliative care consultation.  Viral respiratory panel positive for metapneumovirus and rhinovirus.   Continue empiric antibiotics for pneumonia. Patient with elevated creatinine 2.3 and sodium 150 12/10: Cr 2.53, Na 153, still not waking up to eat / not following commands. (+)MRSA  BCx, ID recs continue abx escalate linezolid  + ceftriaxone  + azithro + daptomycin .       Consultants:  ICU on admission Palliative Care  Infectious disease   Procedures/Surgeries: none      ASSESSMENT & PLAN:   Severe sepsis Present on admission  multifocal pneumonia - Unclear if viral or bacterial.  Has 2 viruses positive on viral respiratory panel but also very sick End organ dysfunction: acute respiratory failure, acute kidney injury, altered mental status, hypotension, tachycardia and tachypnea.   aggressive antibiotics - ID recs continue abx escalate linezolid  + ceftriaxone  + azithro + daptomycin .  Given stress dose steroid.   Acute respiratory failure with hypoxia   D/t pneumonia as above  Had a pulse ox of 81% on 5 L on admisison.   Taper oxygen as needed.    AKI (acute kidney injury) Creatinine 2.53 today, creatinine 2.78 on presentation.   Continue IV fluids.   Acute metabolic encephalopathy Unable to pass swallow eval yesterday or today.  CT notes diffuse cerebral parenchymal volume loss and moderate chronic ischemic disease  Delirium and fall precautions  See palliative note - if not improving then consider for comfort measures  Right arm contracted CT head yesterday non-acute Monitor   Bullous pemphigoid On chronic steroids    Hypernatremia IV fluid hydration Monitor BMP   High anion gap metabolic acidosis on admission AG now normal but Cl high and CO2 low  Likely secondary to infection and acute kidney injury Monitor BMP Treat underlying cause(s).  Elevated liver function tests on admission Question d/t shock liver, levels are trending down w/ tx as above  Monitor LFT   Thrombocytopenia Secondary to severe sepsis Monitor CBC   Iron  deficiency anemia No apparent ABLA Likely some diffusion component given high volume IV fluids received Monitor CBC May end up needing a blood transfusion during the hospital course.   Itching Benadryl   cream         Borderline underweight based on BMI: Body mass index is 19.27 kg/m.SABRA Significantly low or high BMI is associated with higher medical risk.  Underweight - under 18  overweight - 25 to 29 obese - 30 or more Class 1 obesity: BMI of 30.0 to 34 Class 2 obesity: BMI of 35.0 to 39 Class 3 obesity: BMI of 40.0 to 49 Super Morbid Obesity: BMI 50-59 Super-super Morbid Obesity: BMI 60+ Healthy nutrition and physical activity advised as adjunct to other disease management and risk reduction treatments    DVT prophylaxis: heparin  IV fluids: no continuous IV fluids  Nutrition: D5LR maintenance fluids  Central lines / other devices: none  Code Status: DNR/DNI ACP documentation reviewed: has DNR order on file in [facility]  TOC needs: TBD Medical barriers to dispo: sepsis / bacteremia. Expected medical readiness for discharge pending clinical course.              Subjective / Brief ROS:  Patient not contributory  Family Communication: called family - niece Tammy 06/01/24 6:18 PM all questions answered continue current plan see how abx do over next 1-2 days and go from there     Objective Findings:  Vitals:   06/01/24 1500 06/01/24 1530 06/01/24 1600 06/01/24 1700  BP: 103/67  114/62   Pulse: 88  94 94  Resp: (!) 22  (!) 23 (!) 22  Temp:      TempSrc:      SpO2: 97%  94% 93%  Weight:      Height:  5' 8 (1.727 m)      Intake/Output Summary (Last 24 hours) at 06/01/2024 1818 Last data filed at 06/01/2024 1600 Gross per 24 hour  Intake 1116.37 ml  Output 400 ml  Net 716.37 ml   Filed Weights   05/30/24 1630 05/31/24 0500 06/01/24 0500  Weight: 58.2 kg 59.5 kg 57.5 kg    Examination:  Physical Exam Constitutional:      General: He is not in acute distress.    Comments: Alert / eyes open but not tracking or following commands, not verbalizing  Eyes:     Conjunctiva/sclera: Conjunctivae normal.  Cardiovascular:     Rate and Rhythm: Normal rate  and regular rhythm.  Pulmonary:     Effort: Pulmonary effort is normal.     Breath sounds: Normal breath sounds.  Abdominal:     General: Bowel sounds are normal.     Palpations: Abdomen is soft.  Musculoskeletal:     Right lower leg: No edema.     Left lower leg: No edema.  Skin:    General: Skin is warm and dry.  Neurological:     Mental Status: He is alert.     Comments: Unable to assess - he is not following commands, not verbalizing though appears alert/awake          Scheduled Medications:   Chlorhexidine  Gluconate Cloth  6 each Topical Daily   clobetasol  ointment  1 Application Topical BID   diphenhydrAMINE -zinc  acetate   Topical BID   heparin   5,000 Units Subcutaneous  Q8H   hydrocortisone  sod succinate (SOLU-CORTEF ) inj  50 mg Intravenous Q6H   insulin  aspart  0-6 Units Subcutaneous Q4H   mupirocin  ointment  1 Application Nasal BID    Continuous Infusions:  azithromycin  Stopped (06/01/24 1551)   cefTRIAXone  (ROCEPHIN )  IV Stopped (06/01/24 1248)   DAPTOmycin  500 mg (06/01/24 1759)   dextrose  5% lactated ringers      linezolid  (ZYVOX ) IV Stopped (06/01/24 1018)    PRN Medications:    Antimicrobials from admission:  Anti-infectives (From admission, onward)    Start     Dose/Rate Route Frequency Ordered Stop   06/01/24 1800  DAPTOmycin  (CUBICIN ) IVPB 500 mg/66mL premix        8 mg/kg  57.5 kg 100 mL/hr over 30 Minutes Intravenous Every 48 hours 06/01/24 1539     05/31/24 1300  azithromycin  (ZITHROMAX ) 500 mg in sodium chloride  0.9 % 250 mL IVPB        500 mg 250 mL/hr over 60 Minutes Intravenous Every 24 hours 05/30/24 1250     05/31/24 1200  cefTRIAXone  (ROCEPHIN ) 2 g in sodium chloride  0.9 % 100 mL IVPB        2 g 200 mL/hr over 30 Minutes Intravenous Every 24 hours 05/30/24 1250     05/30/24 2200  linezolid  (ZYVOX ) IVPB 600 mg        600 mg 300 mL/hr over 60 Minutes Intravenous Every 12 hours 05/30/24 1906     05/30/24 1215  cefTRIAXone  (ROCEPHIN ) 2  g in sodium chloride  0.9 % 100 mL IVPB        2 g 200 mL/hr over 30 Minutes Intravenous Once 05/30/24 1209 05/30/24 1350   05/30/24 1215  azithromycin  (ZITHROMAX ) 500 mg in sodium chloride  0.9 % 250 mL IVPB        500 mg 250 mL/hr over 60 Minutes Intravenous  Once 05/30/24 1209 05/30/24 1534           Data Reviewed:  I have personally reviewed the following...  CBC: Recent Labs  Lab 05/30/24 1054 05/31/24 0313 06/01/24 0625  WBC 11.2* 7.1 8.4  NEUTROABS 9.6*  --   --   HGB 10.4* 7.8* 8.5*  HCT 34.0* 24.3* 27.6*  MCV 110.4* 105.7* 106.6*  PLT 188 125* 145*   Basic Metabolic Panel: Recent Labs  Lab 05/30/24 1054 05/30/24 2011 05/31/24 0313 06/01/24 0411  NA 149* 151* 150* 153*  K 4.2 3.6 3.6 3.8  CL 117* 120* 119* 125*  CO2 13* 15* 15* 15*  GLUCOSE 111* 85 122* 129*  BUN 67* 62* 61* 64*  CREATININE 2.78* 2.43* 2.32* 2.53*  CALCIUM  9.3 7.9* 7.9* 8.4*  MG  --   --  2.2  --   PHOS  --   --  4.6  --    GFR: Estimated Creatinine Clearance: 14.5 mL/min (A) (by C-G formula based on SCr of 2.53 mg/dL (H)). Liver Function Tests: Recent Labs  Lab 05/30/24 1054 05/30/24 2011  AST 173* 167*  ALT 470* 344*  ALKPHOS 95 68  BILITOT 0.8 0.5  PROT 5.8* 4.5*  ALBUMIN 2.9* 2.5*   No results for input(s): LIPASE, AMYLASE in the last 168 hours. No results for input(s): AMMONIA in the last 168 hours. Coagulation Profile: Recent Labs  Lab 05/30/24 1235  INR 1.5*   Cardiac Enzymes: No results for input(s): CKTOTAL, CKMB, CKMBINDEX, TROPONINI in the last 168 hours. BNP (last 3 results) No results for input(s): PROBNP in the last 8760 hours. HbA1C: Recent Labs  05/30/24 1053  HGBA1C 5.3   CBG: Recent Labs  Lab 06/01/24 0322 06/01/24 0405 06/01/24 0801 06/01/24 1141 06/01/24 1622  GLUCAP 103* 111* 117* 130* 117*   Lipid Profile: No results for input(s): CHOL, HDL, LDLCALC, TRIG, CHOLHDL, LDLDIRECT in the last 72  hours. Thyroid  Function Tests: No results for input(s): TSH, T4TOTAL, FREET4, T3FREE, THYROIDAB in the last 72 hours. Anemia Panel: No results for input(s): VITAMINB12, FOLATE, FERRITIN, TIBC, IRON , RETICCTPCT in the last 72 hours. Most Recent Urinalysis On File:     Component Value Date/Time   COLORURINE YELLOW (A) 05/30/2024 1341   APPEARANCEUR HAZY (A) 05/30/2024 1341   APPEARANCEUR Clear 02/02/2023 1632   LABSPEC 1.014 05/30/2024 1341   PHURINE 5.0 05/30/2024 1341   GLUCOSEU NEGATIVE 05/30/2024 1341   HGBUR NEGATIVE 05/30/2024 1341   BILIRUBINUR NEGATIVE 05/30/2024 1341   BILIRUBINUR Negative 02/02/2023 1632   KETONESUR NEGATIVE 05/30/2024 1341   PROTEINUR NEGATIVE 05/30/2024 1341   NITRITE NEGATIVE 05/30/2024 1341   LEUKOCYTESUR NEGATIVE 05/30/2024 1341   Sepsis Labs: @LABRCNTIP (procalcitonin:4,lacticidven:4) Microbiology: Recent Results (from the past 240 hours)  Blood Culture (routine x 2)     Status: None (Preliminary result)   Collection Time: 05/30/24 10:54 AM   Specimen: BLOOD  Result Value Ref Range Status   Specimen Description BLOOD BLOOD LEFT ARM  Final   Special Requests   Final    BOTTLES DRAWN AEROBIC AND ANAEROBIC Blood Culture results may not be optimal due to an inadequate volume of blood received in culture bottles   Culture   Final    NO GROWTH 2 DAYS Performed at [facility], [address] [place], [place] [address]    Report Status PENDING  Incomplete  Blood Culture (routine x 2)     Status: None (Preliminary result)   Collection Time: 05/30/24 10:54 AM   Specimen: BLOOD  Result Value Ref Range Status   Specimen Description BLOOD BLOOD LEFT HAND  Final   Special Requests   Final    BOTTLES DRAWN AEROBIC AND ANAEROBIC Blood Culture results may not be optimal due to an inadequate volume of blood received in culture bottles   Culture  Setup Time   Final    Organism ID to follow GRAM POSITIVE COCCI ANAEROBIC  BOTTLE ONLY CRITICAL RESULT CALLED TO, READ BACK BY AND VERIFIED WITH: WILL ANDERSON 06/01/24 1449 KLW Performed at [facility], [address] [place], [place] [address]    Culture GRAM POSITIVE COCCI  Final   Report Status PENDING  Incomplete  Resp panel by RT-PCR (RSV, Flu A&B, Covid) Anterior Nasal Swab     Status: None   Collection Time: 05/30/24 10:54 AM   Specimen: Anterior Nasal Swab  Result Value Ref Range Status   SARS Coronavirus 2 by RT PCR NEGATIVE NEGATIVE Final    Comment: (NOTE) SARS-CoV-2 target nucleic acids are NOT DETECTED.  The SARS-CoV-2 RNA is generally detectable in upper respiratory specimens during the acute phase of infection. The lowest concentration of SARS-CoV-2 viral copies this assay can detect is 138 copies/mL. A negative result does not preclude SARS-Cov-2 infection and should not be used as the sole basis for treatment or other patient management decisions. A negative result may occur with  improper specimen collection/handling, submission of specimen other than nasopharyngeal swab, presence of viral mutation(s) within the areas targeted by this assay, and inadequate number of viral copies(<138 copies/mL). A negative result must be combined with clinical observations, patient history, and epidemiological information. The  expected result is Negative.  Fact Sheet for Patients:  bloggercourse.com  Fact Sheet for Healthcare Providers:  seriousbroker.it  This test is no t yet approved or cleared by the [place]  FDA and  has been authorized for detection and/or diagnosis of SARS-CoV-2 by FDA under an Emergency Use Authorization (EUA). This EUA will remain  in effect (meaning this test can be used) for the duration of the COVID-19 declaration under Section 564(b)(1) of the Act, 21 U.S.C.section 360bbb-3(b)(1), unless the authorization is terminated  or revoked sooner.        Influenza A by PCR NEGATIVE NEGATIVE Final   Influenza B by PCR NEGATIVE NEGATIVE Final    Comment: (NOTE) The Xpert Xpress SARS-CoV-2/FLU/RSV plus assay is intended as an aid in the diagnosis of influenza from Nasopharyngeal swab specimens and should not be used as a sole basis for treatment. Nasal washings and aspirates are unacceptable for Xpert Xpress SARS-CoV-2/FLU/RSV testing.  Fact Sheet for Patients: bloggercourse.com  Fact Sheet for Healthcare Providers: seriousbroker.it  This test is not yet approved or cleared by the [place]  FDA and has been authorized for detection and/or diagnosis of SARS-CoV-2 by FDA under an Emergency Use Authorization (EUA). This EUA will remain in effect (meaning this test can be used) for the duration of the COVID-19 declaration under Section 564(b)(1) of the Act, 21 U.S.C. section 360bbb-3(b)(1), unless the authorization is terminated or revoked.     Resp Syncytial Virus by PCR NEGATIVE NEGATIVE Final    Comment: (NOTE) Fact Sheet for Patients: bloggercourse.com  Fact Sheet for Healthcare Providers: seriousbroker.it  This test is not yet approved or cleared by the [place]  FDA and has been authorized for detection and/or diagnosis of SARS-CoV-2 by FDA under an Emergency Use Authorization (EUA). This EUA will remain in effect (meaning this test can be used) for the duration of the COVID-19 declaration under Section 564(b)(1) of the Act, 21 U.S.C. section 360bbb-3(b)(1), unless the authorization is terminated or revoked.  Performed at [facility], [address] [place], [place] [address]   Blood Culture ID Panel (Reflexed)     Status: Abnormal   Collection Time: 05/30/24 10:54 AM  Result Value Ref Range Status   Enterococcus faecalis NOT DETECTED NOT DETECTED Final   Enterococcus Faecium NOT DETECTED NOT DETECTED  Final   Listeria monocytogenes NOT DETECTED NOT DETECTED Final   Staphylococcus species DETECTED (A) NOT DETECTED Final    Comment: CRITICAL RESULT CALLED TO, READ BACK BY AND VERIFIED WITH: WILL ANDERSON 06/01/24 1449 KLW    Staphylococcus aureus (BCID) DETECTED (A) NOT DETECTED Final    Comment: Methicillin (oxacillin)-resistant Staphylococcus aureus (MRSA). MRSA is predictably resistant to beta-lactam antibiotics (except ceftaroline). Preferred therapy is vancomycin  unless clinically contraindicated. Patient requires contact precautions if  hospitalized. CRITICAL RESULT CALLED TO, READ BACK BY AND VERIFIED WITH: WILL ANDERSON 06/01/24 1449 KLW    Staphylococcus epidermidis NOT DETECTED NOT DETECTED Final   Staphylococcus lugdunensis NOT DETECTED NOT DETECTED Final   Streptococcus species NOT DETECTED NOT DETECTED Final   Streptococcus agalactiae NOT DETECTED NOT DETECTED Final   Streptococcus pneumoniae NOT DETECTED NOT DETECTED Final   Streptococcus pyogenes NOT DETECTED NOT DETECTED Final   A.calcoaceticus-baumannii NOT DETECTED NOT DETECTED Final   Bacteroides fragilis NOT DETECTED NOT DETECTED Final   Enterobacterales NOT DETECTED NOT DETECTED Final   Enterobacter cloacae complex NOT DETECTED NOT DETECTED Final   Escherichia coli NOT DETECTED NOT DETECTED Final   Klebsiella aerogenes NOT DETECTED NOT DETECTED Final  Klebsiella oxytoca NOT DETECTED NOT DETECTED Final   Klebsiella pneumoniae NOT DETECTED NOT DETECTED Final   Proteus species NOT DETECTED NOT DETECTED Final   Salmonella species NOT DETECTED NOT DETECTED Final   Serratia marcescens NOT DETECTED NOT DETECTED Final   Haemophilus influenzae NOT DETECTED NOT DETECTED Final   Neisseria meningitidis NOT DETECTED NOT DETECTED Final   Pseudomonas aeruginosa NOT DETECTED NOT DETECTED Final   Stenotrophomonas maltophilia NOT DETECTED NOT DETECTED Final   Candida albicans NOT DETECTED NOT DETECTED Final   Candida auris NOT  DETECTED NOT DETECTED Final   Candida glabrata NOT DETECTED NOT DETECTED Final   Candida krusei NOT DETECTED NOT DETECTED Final   Candida parapsilosis NOT DETECTED NOT DETECTED Final   Candida tropicalis NOT DETECTED NOT DETECTED Final   Cryptococcus neoformans/gattii NOT DETECTED NOT DETECTED Final   Meth resistant mecA/C and MREJ DETECTED (A) NOT DETECTED Final    Comment: CRITICAL RESULT CALLED TO, READ BACK BY AND VERIFIED WITH: WILL ANDERSON 06/01/24 1449 KLW Performed at [facility], [address] [place], [place] [address]   MRSA Next Gen by PCR, Nasal     Status: Abnormal   Collection Time: 05/30/24 12:49 PM   Specimen: Nasal Mucosa; Nasal Swab  Result Value Ref Range Status   MRSA by PCR Next Gen DETECTED (A) NOT DETECTED Final    Comment: RESULT CALLED TO, READ BACK BY AND VERIFIED WITH: MYRA FLOWERS 1813 05/30/24 MU (NOTE) The GeneXpert MRSA Assay (FDA approved for NASAL specimens only), is one component of a comprehensive MRSA colonization surveillance program. It is not intended to diagnose MRSA infection nor to guide or monitor treatment for MRSA infections. Test performance is not FDA approved in patients less than 87 years old. Performed at [facility], [address] [place], [place] [address]   Respiratory (~20 pathogens) panel by PCR     Status: Abnormal   Collection Time: 05/30/24  1:55 PM   Specimen: Nasopharyngeal Swab; Respiratory  Result Value Ref Range Status   Adenovirus NOT DETECTED NOT DETECTED Final   Coronavirus 229E NOT DETECTED NOT DETECTED Final    Comment: (NOTE) The Coronavirus on the Respiratory Panel, DOES NOT test for the novel  Coronavirus (2019 nCoV)    Coronavirus HKU1 NOT DETECTED NOT DETECTED Final   Coronavirus NL63 NOT DETECTED NOT DETECTED Final   Coronavirus OC43 NOT DETECTED NOT DETECTED Final   Metapneumovirus DETECTED (A) NOT DETECTED Final   Rhinovirus / Enterovirus DETECTED (A) NOT DETECTED Final    Influenza A NOT DETECTED NOT DETECTED Final   Influenza B NOT DETECTED NOT DETECTED Final   Parainfluenza Virus 1 NOT DETECTED NOT DETECTED Final   Parainfluenza Virus 2 NOT DETECTED NOT DETECTED Final   Parainfluenza Virus 3 NOT DETECTED NOT DETECTED Final   Parainfluenza Virus 4 NOT DETECTED NOT DETECTED Final   Respiratory Syncytial Virus NOT DETECTED NOT DETECTED Final   Bordetella pertussis NOT DETECTED NOT DETECTED Final   Bordetella Parapertussis NOT DETECTED NOT DETECTED Final   Chlamydophila pneumoniae NOT DETECTED NOT DETECTED Final   Mycoplasma pneumoniae NOT DETECTED NOT DETECTED Final    Comment: Performed at [facility], [address] [place], [place] [address]      Radiology Studies last 3 days: CT HEAD WO CONTRAST ( ) Result Date: 05/31/2024 EXAM: CT HEAD WITHOUT 05/31/2024 11:05:29 AM TECHNIQUE: CT of the head was performed without the administration of intravenous contrast. Automated exposure control, iterative reconstruction, and/or weight based adjustment  of the mA/kV was utilized to reduce the radiation dose to as low as reasonably achievable. COMPARISON: 12/06/2023 CLINICAL HISTORY: Mental status change, unknown cause FINDINGS: BRAIN AND VENTRICLES: No acute intracranial hemorrhage. No mass effect or midline shift. No extra-axial fluid collection. No evidence of acute infarct. No hydrocephalus. Proportional prominence of ventricles and sulci, consistent with diffuse cerebral parenchymal volume loss. Periventricular and subcortical white matter hypoattenuation, consistent with moderate chronic ischemic microvascular disease. Calcified atherosclerotic plaque within cavernous/supraclinoid internal carotid arteries. ORBITS: No acute abnormality. Bilateral lens replacements. SINUSES AND MASTOIDS: Mild scattered mucosal thickening in the ethmoid sinuses. Trace bilateral mastoid effusions. SOFT TISSUES AND SKULL: No acute skull fracture. No acute soft tissue abnormality.  IMPRESSION: 1. No acute intracranial abnormality. 2. Diffuse cerebral parenchymal volume loss. 3. Moderate chronic ischemic microvascular disease. Electronically signed by: Donnice Mania MD 05/31/2024 10:53 PM EST RP Workstation: [id number]   CT CHEST WO CONTRAST Result Date: 05/30/2024 CLINICAL DATA:  Respiratory failure, pneumonia EXAM: CT CHEST WITHOUT CONTRAST TECHNIQUE: Multidetector CT imaging of the chest was performed following the standard protocol without IV contrast. RADIATION DOSE REDUCTION: This exam was performed according to the departmental dose-optimization program which includes automated exposure control, adjustment of the mA and/or kV according to patient size and/or use of iterative reconstruction technique. COMPARISON:  05/30/2024, 09/28/2023 FINDINGS: Cardiovascular: Unenhanced imaging of the heart is unremarkable without pericardial effusion. There is continued dense calcification of the mitral and aortic valves. 4.6 cm ascending thoracic aortic aneurysm is noted, unchanged by my measurements since previous study. There is diffuse atherosclerosis of the aorta and coronary vasculature. Assessment of the vascular lumen cannot be performed without intravenous contrast. Mediastinum/Nodes: Borderline enlarged mediastinal and hilar lymph nodes are likely reactive. Thyroid , trachea, and esophagus are unremarkable. Lungs/Pleura: There is multifocal bilateral airspace disease, most pronounced within the right upper, right lower, and left lower lobes, consistent with bilateral bronchopneumonia. Trace right pleural effusion. No pneumothorax. Upper Abdomen: No acute abnormality. Musculoskeletal: No acute or destructive bony abnormalities. Multiple prior healed bilateral rib fractures. Chronic compression deformities at T5, T9, T10, and L1. Reconstructed images demonstrate no additional findings. IMPRESSION: 1. Multifocal bilateral bronchopneumonia as above. 2. Trace right parapneumonic effusion. 3.  Reactive mediastinal and bilateral hilar lymph nodes. 4. 4.6 cm ascending thoracic aortic aneurysm. Recommend semi-annual imaging followup by CTA or MRA and referral to cardiothoracic surgery if not already obtained. This recommendation follows 2010 ACCF/AHA/AATS/ACR/ASA/SCA/SCAI/SIR/STS/SVM Guidelines for the Diagnosis and Management of Patients With Thoracic Aortic Disease. Circulation. 2010; 121: Z733-z630. Aortic aneurysm NOS (ICD10-I71.9) 5. Aortic Atherosclerosis (ICD10-I70.0). Coronary artery atherosclerosis. Electronically Signed   By: Ozell Daring M.D.   On: 05/30/2024 16:58   DG Chest Port 1 View Result Date: 05/30/2024 EXAM: 1 VIEW(S) XRAY OF THE CHEST 05/30/2024 11:04:00 AM COMPARISON: 12/06/2023 CLINICAL HISTORY: Questionable sepsis - evaluate for abnormality FINDINGS: LIMITATIONS/ARTIFACTS: Rightward rotation of the frontal projection reducing diagnostic sensitivity/specificity. Assessment complicated by the rightward rotation. LUNGS AND PLEURA: New substantial opacity at the right lung base without obscuration of the right heart border, presumably this is a right lower lobe airspace opacity, although a diaphragmatic hernia could appear similarly. This could be further assessed with chest CT if clinically warranted. No pleural effusion. No pneumothorax. HEART AND MEDIASTINUM: Aortic atherosclerosis. No acute abnormality of the cardiac and mediastinal silhouettes. BONES AND SOFT TISSUES: No acute osseous abnormality. IMPRESSION: 1. Right lung base opacity, favored right lower lobe airspace process over new diaphragmatic hernia; evaluation limited by rightward rotation, and chest CT may be considered  if warranted. 2. Aortic atherosclerosis. Electronically signed by: Ryan Salvage MD 05/30/2024 12:03 PM EST RP Workstation: [id number]          Laneta Blunt, DO [facility] 06/01/2024, 6:18 PM    Dictation software may have been used to generate the above note. Typos may  occur and escape review in typed/dictated notes. Please contact Dr Blunt directly for clarity if needed.  Staff may message me via secure chat in Epic  but this may not receive an immediate response,  please page me for urgent matters!  If 7PM-7AM, please contact night coverage [email or web address]

## 2024-06-01 NOTE — Plan of Care (Signed)
   Problem: Coping: Goal: Ability to adjust to condition or change in health will improve Outcome: Progressing   Problem: Fluid Volume: Goal: Ability to maintain a balanced intake and output will improve Outcome: Progressing   Problem: Skin Integrity: Goal: Risk for impaired skin integrity will decrease Outcome: Progressing

## 2024-06-01 NOTE — TOC Initial Note (Signed)
 Transition of Care [facility]) - Initial/Assessment Note    Patient Details  Name: Nicholas Gross MRN: [id number] Date of Birth: 05-Sep-1927  [facility] CM/SW Contact:    Corrie JINNY Ruts, LCSW Phone Number: 06/01/2024, 11:41 AM  Clinical Narrative:                 Chart reviewed. The patient is not fully oriented and unable to consult. I was able to speak with the patient sister at bedside today. The patient sister reports that the patient has a PCP. The patient sister confirms that the patient is LTC of [facility]. The patient sister reports that LC reports that she would have to come get the patient belongings.   SW called Therisa from [facility] and confirmed that the patient could return at D/C and the patient have to have a bed. SW will reach out to the sister and inform her about patient bed at [facility].        Patient Goals and CMS Choice            Expected Discharge Plan and Services                                              Prior Living Arrangements/Services                       Activities of Daily Living   ADL Screening (condition at time of admission) Independently performs ADLs?: No Does the patient have a NEW difficulty with bathing/dressing/toileting/self-feeding that is expected to last >3 days?: No Does the patient have a NEW difficulty with getting in/out of bed, walking, or climbing stairs that is expected to last >3 days?: No Does the patient have a NEW difficulty with communication that is expected to last >3 days?: No Is the patient deaf or have difficulty hearing?: Yes Does the patient have difficulty seeing, even when wearing glasses/contacts?: Yes Does the patient have difficulty concentrating, remembering, or making decisions?: Yes  Permission Sought/Granted                  Emotional Assessment              Admission diagnosis:  Respiratory failure with hypoxia (HCC) [J96.91] Septic shock (HCC) [A41.9,  R65.21] Patient Active Problem List   Diagnosis Date Noted   Severe sepsis (HCC) 05/31/2024   Acute respiratory failure with hypoxia (HCC) 05/31/2024   AKI (acute kidney injury) 05/31/2024   Multifocal pneumonia 05/31/2024   High anion gap metabolic acidosis 05/31/2024   Elevated liver function tests 05/31/2024   Itching 05/31/2024   Iron  deficiency anemia 05/31/2024   Thrombocytopenia 05/31/2024   Right femoral fracture (HCC) 12/06/2023   Leukocytosis 12/06/2023   Malnutrition of moderate degree 10/30/2023   Closed displaced fracture of left femoral neck (HCC) 10/29/2023   Dysarthria as late effect of cerebrovascular accident (CVA) 10/29/2023   Urinary tract infection 10/29/2023   Fracture of pubic ramus with delayed healing, unspecified laterality 10/05/2023   Bullous pemphigoid (HCC) 10/05/2023   Functional quadriplegia (HCC) 10/05/2023   Hypernatremia 09/30/2023   Pressure injury of skin 09/30/2023   Fall 09/29/2023   Urinary retention 09/29/2023   Hypokalemia 09/29/2023   Hematoma of extraperitoneal space 09/29/2023   Acute metabolic encephalopathy 09/28/2023   Weight loss 06/05/2023   Bullous disorder 06/01/2023  Mesenteric mass 06/01/2023   Dysarthria 04/17/2023   Tremor 04/17/2023   Aphasia 04/10/2023   Dysphagia 04/10/2023   History of falling 03/24/2023   Long term (current) use of aspirin  03/24/2023   Occlusion and stenosis of unspecified carotid artery 03/24/2023   Pain and swelling of lower leg 09/15/2022   Right acetabular fracture (HCC) 06/17/2022   Aortic stenosis, mild 04/25/2022   Cellulitis of both lower extremities 04/24/2022   Aneurysm of ascending aorta without rupture 12/23/2021   Compression fracture of L1 lumbar vertebra (HCC) 12/23/2021   Pulmonary nodule 12/23/2021   Right inguinal hernia 12/23/2021   Osteoarthritis of right hip 12/23/2021   Aortic atherosclerosis 12/23/2021   Multiple falls 12/12/2021   Left rib fracture 12/12/2021    Senile purpura 11/08/2021   PAD (peripheral artery disease) 07/15/2020   Lymphedema 05/06/2019   Dyslipidemia 02/13/2016   Macular degeneration 08/15/2015   Carotid atherosclerosis 08/15/2015   Goals of care, counseling/discussion 02/12/2015   Allergic rhinitis due to pollen 02/12/2015   Hypertension    CAD (coronary artery disease)    H/O cardiac catheterization 06/12/2014   History of left-sided carotid endarterectomy 06/12/2014   PCP:  Vicci [name], DO Pharmacy:   [facility] [place] - [address] [place] [address] Phone: [phone] Fax: [phone]     Social Drivers of Health (SDOH) Social History: SDOH Screenings   Food Insecurity: No Food Insecurity (05/30/2024)  Housing: Unknown (05/30/2024)  Transportation Needs: No Transportation Needs (05/30/2024)  Utilities: Not At Risk (05/30/2024)  Alcohol  Screen: Low Risk  (05/13/2021)  Depression (PHQ2-9): Low Risk  (07/06/2023)  Recent Concern: Depression (PHQ2-9) - Medium Risk (06/01/2023)  Financial Resource Strain: Low Risk  (12/30/2023)   Received from [facility]  Physical Activity: Unknown (05/13/2021)  Social Connections: Patient Unable To Answer (05/30/2024)  Stress: No Stress Concern Present (05/13/2021)  Tobacco Use: Medium Risk (02/03/2024)   Received from [facility]   SDOH Interventions:     Readmission Risk Interventions    06/01/2024   11:41 AM  Readmission Risk Prevention Plan  Transportation Screening Complete  Medication Review (RN Care Manager) Complete  HRI or Home Care Consult Complete  SW Recovery Care/Counseling Consult Complete  Palliative Care Screening Not Applicable  Skilled Nursing Facility Complete

## 2024-06-01 NOTE — NC FL2 (Signed)
 [place]  MEDICAID FL2 LEVEL OF CARE FORM     IDENTIFICATION  Patient Name: Nicholas Gross Birthdate: [birth year] Sex: male Admission Date (Current Location): 05/30/2024  [place] and [place] Number:  Chiropodist and Address:  [facility], [address], [place], [place] [address]      Provider Number: [id number]  Attending Physician Name and Address:  Marsa Edelman, DO  Relative Name and Phone Number:       Current Level of Care: Hospital Recommended Level of Care: Skilled Nursing Facility Prior Approval Number:    Date Approved/Denied:   PASRR Number: [id number] [name] Must has the first 3 of his SS# as [id number].  Discharge Plan: SNF    Current Diagnoses: Patient Active Problem List   Diagnosis Date Noted   Severe sepsis (HCC) 05/31/2024   Acute respiratory failure with hypoxia (HCC) 05/31/2024   AKI (acute kidney injury) 05/31/2024   Multifocal pneumonia 05/31/2024   High anion gap metabolic acidosis 05/31/2024   Elevated liver function tests 05/31/2024   Itching 05/31/2024   Iron  deficiency anemia 05/31/2024   Thrombocytopenia 05/31/2024   Right femoral fracture (HCC) 12/06/2023   Leukocytosis 12/06/2023   Malnutrition of moderate degree 10/30/2023   Closed displaced fracture of left femoral neck (HCC) 10/29/2023   Dysarthria as late effect of cerebrovascular accident (CVA) 10/29/2023   Urinary tract infection 10/29/2023   Fracture of pubic ramus with delayed healing, unspecified laterality 10/05/2023   Bullous pemphigoid (HCC) 10/05/2023   Functional quadriplegia (HCC) 10/05/2023   Hypernatremia 09/30/2023   Pressure injury of skin 09/30/2023   Fall 09/29/2023   Urinary retention 09/29/2023   Hypokalemia 09/29/2023   Hematoma of extraperitoneal space 09/29/2023   Acute metabolic encephalopathy 09/28/2023   Weight loss 06/05/2023   Bullous disorder 06/01/2023   Mesenteric mass 06/01/2023   Dysarthria 04/17/2023    Tremor 04/17/2023   Aphasia 04/10/2023   Dysphagia 04/10/2023   History of falling 03/24/2023   Long term (current) use of aspirin  03/24/2023   Occlusion and stenosis of unspecified carotid artery 03/24/2023   Pain and swelling of lower leg 09/15/2022   Right acetabular fracture (HCC) 06/17/2022   Aortic stenosis, mild 04/25/2022   Cellulitis of both lower extremities 04/24/2022   Aneurysm of ascending aorta without rupture 12/23/2021   Compression fracture of L1 lumbar vertebra (HCC) 12/23/2021   Pulmonary nodule 12/23/2021   Right inguinal hernia 12/23/2021   Osteoarthritis of right hip 12/23/2021   Aortic atherosclerosis 12/23/2021   Multiple falls 12/12/2021   Left rib fracture 12/12/2021   Senile purpura 11/08/2021   PAD (peripheral artery disease) 07/15/2020   Lymphedema 05/06/2019   Dyslipidemia 02/13/2016   Macular degeneration 08/15/2015   Carotid atherosclerosis 08/15/2015   Goals of care, counseling/discussion 02/12/2015   Allergic rhinitis due to pollen 02/12/2015   Hypertension    CAD (coronary artery disease)    H/O cardiac catheterization 06/12/2014   History of left-sided carotid endarterectomy 06/12/2014    Orientation RESPIRATION BLADDER Height & Weight      (Disoriented x 4)  Normal Incontinent, External catheter Weight: 126 lb 12.2 oz (57.5 kg) Height:     BEHAVIORAL SYMPTOMS/MOOD NEUROLOGICAL BOWEL NUTRITION STATUS   (None)  (None) Incontinent Diet (Follow for discharge recommendations. Currently NPO.)  AMBULATORY STATUS COMMUNICATION OF NEEDS Skin     Non-Verbally Skin abrasions, Bruising, Other (Comment) (Erythema/redness, rash.)  Personal Care Assistance Level of Assistance              Functional Limitations Info  Sight, Hearing, Speech Sight Info: Adequate Hearing Info: Adequate Speech Info: Impaired ([place])    SPECIAL CARE FACTORS FREQUENCY                       Contractures Contractures Info:  Present    Additional Factors Info  Code Status, Allergies, Isolation Precautions Code Status Info: DNR Allergies Info: Simvastatin , Other: Patient states that he is allergic to something that they place in the IV before they work on you.     Isolation Precautions Info: Droplet     Current Medications (06/01/2024):  This is the current hospital active medication list Current Facility-Administered Medications  Medication Dose Route Frequency Provider Last Rate Last Admin   azithromycin  (ZITHROMAX ) 500 mg in sodium chloride  0.9 % 250 mL IVPB  500 mg Intravenous Q24H Isadora Hose, MD 250 mL/hr at 06/01/24 1450 500 mg at 06/01/24 1450   cefTRIAXone  (ROCEPHIN ) 2 g in sodium chloride  0.9 % 100 mL IVPB  2 g Intravenous Q24H Isadora Hose, MD   Paused at 06/01/24 1248   Chlorhexidine  Gluconate Cloth 2 % PADS 6 each  6 each Topical Daily Isadora Hose, MD   6 each at 06/01/24 0944   clobetasol  ointment (TEMOVATE ) 0.05 % 1 Application  1 Application Topical BID Isadora Hose, MD   1 Application at 06/01/24 0920   diphenhydrAMINE -zinc  acetate (BENADRYL ) 2-0.1 % cream   Topical BID Josette Ade, MD   Given at 06/01/24 0919   heparin  injection 5,000 Units  5,000 Units Subcutaneous Q8H Dgayli, Hose, MD   5,000 Units at 06/01/24 1450   hydrocortisone  sodium succinate  (SOLU-CORTEF ) 100 MG injection 50 mg  50 mg Intravenous Q6H Dgayli, Hose, MD   50 mg at 06/01/24 1218   insulin  aspart (novoLOG ) injection 0-6 Units  0-6 Units Subcutaneous Q4H Isadora Hose, MD   1 Units at 05/31/24 2334   linezolid  (ZYVOX ) IVPB 600 mg  600 mg Intravenous Q12H Isadora Hose, MD   Stopped at 06/01/24 1018   mupirocin  ointment (BACTROBAN ) 2 % 1 Application  1 Application Nasal BID Isadora Hose, MD   1 Application at 06/01/24 0920     Discharge Medications: Please see discharge summary for a list of discharge medications.  Relevant Imaging Results:  Relevant Lab Results:   Additional Information SS#:  [id number]  Lauraine [name], LCSW

## 2024-06-01 NOTE — Progress Notes (Signed)
 Initial Nutrition Assessment  DOCUMENTATION CODES:   Severe malnutrition in context of social or environmental circumstances  INTERVENTION:   RD will add supplements with diet advancement   Pt is at high refeed risk  Daily weights   NUTRITION DIAGNOSIS:   Severe Malnutrition related to social / environmental circumstances as evidenced by severe fat depletion, severe muscle depletion, percent weight loss.  GOAL:   Patient will meet greater than or equal to 90% of their needs  MONITOR:   Diet advancement, Labs, Weight trends, I & O's, Skin  REASON FOR ASSESSMENT:   Malnutrition Screening Tool    ASSESSMENT:   89 y/o male with bullous pemphigoid, IDA, HTN, CAD, HLD, PAD, mesenteric mass, CVA, AAA and recent hip fracture (June 2025) who is admitted with PNA, sepsis, AKI and AMS.  Met with pt and pt's family member in room today. Family remember reports with good appetite and oral intake at baseline but reports pt with decreased appetite and oral intake since being in SNF. Family member reports pt with significant weight loss pta. Per chart, pt is down 29lbs(19%) since breaking his hip in June; this is severe weight loss. RD will add supplements with diet advancement. Pt is at high refeed risk. Palliative care following for GOC.   Medications reviewed and include: heparin , solu-cortef , insulin , azithromycin , ceftriaxone , linezolid   Labs reviewed: Na 153(H), K 3.8 wnl, BUN 64(H), creat 2.53(H) P 4.6 wnl, Mg 2.2 wnl- 12/9 Hgb 8.5(L), Hct 27.6(L) Cbgs- 130, 117, 111, 103 x 24 hrs  AIC 5.3- 12/8  UOP-   NUTRITION - FOCUSED PHYSICAL EXAM:  Flowsheet Row Most Recent Value  Upper Arm Region Severe depletion  Thoracic and Lumbar Region Severe depletion  Buccal Region Moderate depletion  Temple Region Moderate depletion  Clavicle Bone Region Severe depletion  Clavicle and Acromion Bone Region Severe depletion  Scapular Bone Region Moderate depletion  Dorsal Hand  Severe depletion  Patellar Region Severe depletion  Anterior Thigh Region Severe depletion  Posterior Calf Region Severe depletion  Edema (RD Assessment) None  Hair Reviewed  Eyes Reviewed  Mouth Reviewed  Skin Reviewed  Nails Reviewed   Diet Order:   Diet Order             Diet NPO time specified Except for: Ice Chips  Diet effective now                  EDUCATION NEEDS:   No education needs have been identified at this time  Skin:  Skin Assessment: Reviewed RN Assessment (ecchymosis)  Last BM:  12/10- type 5  Height:   Ht Readings from Last 1 Encounters:  06/01/24 5' 8 (1.727 m)    Weight:   Wt Readings from Last 1 Encounters:  06/01/24 57.5 kg    Ideal Body Weight:  70 kg  BMI:  Body mass index is 19.27 kg/m.  Estimated Nutritional Needs:   Kcal:  1700-1900kcal/day  Protein:  85-95g/day  Fluid:  1.5-1.7L/day  Augustin Shams MS, RD, LDN If unable to be reached, please send secure chat to RD inpatient available from 8:00a-4:00p daily

## 2024-06-02 ENCOUNTER — Inpatient Hospital Stay

## 2024-06-02 DIAGNOSIS — Z515 Encounter for palliative care: Secondary | ICD-10-CM | POA: Diagnosis not present

## 2024-06-02 DIAGNOSIS — N179 Acute kidney failure, unspecified: Secondary | ICD-10-CM | POA: Diagnosis not present

## 2024-06-02 DIAGNOSIS — J9601 Acute respiratory failure with hypoxia: Secondary | ICD-10-CM | POA: Diagnosis not present

## 2024-06-02 DIAGNOSIS — E43 Unspecified severe protein-calorie malnutrition: Secondary | ICD-10-CM | POA: Insufficient documentation

## 2024-06-02 DIAGNOSIS — Z7189 Other specified counseling: Secondary | ICD-10-CM

## 2024-06-02 LAB — CBC
HCT: 25.6 % — ABNORMAL LOW (ref 39.0–52.0)
Hemoglobin: 8 g/dL — ABNORMAL LOW (ref 13.0–17.0)
MCH: 33.3 pg (ref 26.0–34.0)
MCHC: 31.3 g/dL (ref 30.0–36.0)
MCV: 106.7 fL — ABNORMAL HIGH (ref 80.0–100.0)
Platelets: 139 K/uL — ABNORMAL LOW (ref 150–400)
RBC: 2.4 MIL/uL — ABNORMAL LOW (ref 4.22–5.81)
RDW: 15.3 % (ref 11.5–15.5)
WBC: 7.7 K/uL (ref 4.0–10.5)
nRBC: 0 % (ref 0.0–0.2)

## 2024-06-02 LAB — GLUCOSE, CAPILLARY
Glucose-Capillary: 128 mg/dL — ABNORMAL HIGH (ref 70–99)
Glucose-Capillary: 135 mg/dL — ABNORMAL HIGH (ref 70–99)
Glucose-Capillary: 143 mg/dL — ABNORMAL HIGH (ref 70–99)
Glucose-Capillary: 143 mg/dL — ABNORMAL HIGH (ref 70–99)
Glucose-Capillary: 154 mg/dL — ABNORMAL HIGH (ref 70–99)
Glucose-Capillary: 170 mg/dL — ABNORMAL HIGH (ref 70–99)
Glucose-Capillary: 173 mg/dL — ABNORMAL HIGH (ref 70–99)

## 2024-06-02 LAB — COMPREHENSIVE METABOLIC PANEL WITH GFR
ALT: 181 U/L — ABNORMAL HIGH (ref 0–44)
AST: 44 U/L — ABNORMAL HIGH (ref 15–41)
Albumin: 2.2 g/dL — ABNORMAL LOW (ref 3.5–5.0)
Alkaline Phosphatase: 99 U/L (ref 38–126)
Anion gap: 16 — ABNORMAL HIGH (ref 5–15)
BUN: 69 mg/dL — ABNORMAL HIGH (ref 8–23)
CO2: 16 mmol/L — ABNORMAL LOW (ref 22–32)
Calcium: 8.2 mg/dL — ABNORMAL LOW (ref 8.9–10.3)
Chloride: 126 mmol/L — ABNORMAL HIGH (ref 98–111)
Creatinine, Ser: 2.56 mg/dL — ABNORMAL HIGH (ref 0.61–1.24)
GFR, Estimated: 23 mL/min — ABNORMAL LOW (ref >60)
Glucose, Bld: 140 mg/dL — ABNORMAL HIGH (ref 70–99)
Potassium: 3.2 mmol/L — ABNORMAL LOW (ref 3.5–5.1)
Sodium: 157 mmol/L — ABNORMAL HIGH (ref 135–145)
Total Bilirubin: 0.3 mg/dL (ref 0.0–1.2)
Total Protein: 4.7 g/dL — ABNORMAL LOW (ref 6.5–8.1)

## 2024-06-02 LAB — ECHOCARDIOGRAM COMPLETE
AR max vel: 1.41 cm2
AV Area VTI: 1.54 cm2
AV Area mean vel: 1.54 cm2
AV Mean grad: 14 mmHg
AV Peak grad: 25.2 mmHg
Ao pk vel: 2.51 m/s
Area-P 1/2: 3.3 cm2
Height: 68 in
S' Lateral: 2.3 cm
Weight: 2028.23 [oz_av]

## 2024-06-02 LAB — CK: Total CK: 38 U/L — ABNORMAL LOW (ref 49–397)

## 2024-06-02 LAB — LEGIONELLA PNEUMOPHILA SEROGP 1 UR AG: L. pneumophila Serogp 1 Ur Ag: NEGATIVE

## 2024-06-02 MED ORDER — KCL-LACTATED RINGERS-D5W 20 MEQ/L IV SOLN
INTRAVENOUS | Status: DC
  Filled 2024-06-02 (×3): qty 1000

## 2024-06-02 NOTE — Consult Note (Signed)
 Reason for Consult:Code Stroke Requesting Physician: Marsa  CC: Unreactive pupils, AMS  I have been asked by Dr. Marsa to see this patient in consultation for possible acute infarct.  HPI: Nicholas Gross is an 89 y.o. male with a history of stroke (residual right sided weakness) CAD, GIB, HLD, HTN who was admitted on 12/8 with sepsis/PNA.  Altered at that time and has remained altered.  Today transferred out of the ICU and was noted today to be less responsive.  Pupils small and unreactive.  Code stroke called at that time. At baseline patient nonambulatory and with significant barriers to speech.  LKW: Unknown since patient has not been at baseline since admission tNK candidate: No; unclear LKW.  Due to unclear LKW that is felt to predate admission, code stroke was cancelled Thrombectomy Candidate: No; High modified rankin score mRankin: 4  Time Paged: [id number] Arrival of MD: 1358  Past Medical History:  Diagnosis Date   CAD (coronary artery disease)    History of GI bleed 2008   History of tobacco use    17 pack years, quit around 1970   Hyperlipidemia    Hypertension    Overweight     Past Surgical History:  Procedure Laterality Date   CAROTID ENDARTERECTOMY Left 2010   CATARACT EXTRACTION     cypher stent  09/12/02   s/p cypher stent mid-LAD   HIP ARTHROPLASTY Left 10/29/2023   Procedure: HEMIARTHROPLASTY (BIPOLAR) HIP, POSTERIOR APPROACH FOR FRACTURE;  Surgeon: Edie Norleen PARAS, MD;  Location: [facility] ORS;  Service: Orthopedics;  Laterality: Left;   INTRAMEDULLARY (IM) NAIL INTERTROCHANTERIC Right 12/07/2023   Procedure: FIXATION, FRACTURE, INTERTROCHANTERIC, WITH INTRAMEDULLARY ROD;  Surgeon: Lorelle Hussar, MD;  Location: [facility] ORS;  Service: Orthopedics;  Laterality: Right;   TENDON REPAIR  1991   finger and arm    Family History  Problem Relation Age of Onset   Heart disease Father        possibly   Cancer Sister        breast   AAA (abdominal aortic aneurysm)  Brother    Cancer Sister        lung    Social History:  reports that he quit smoking about 55 years ago. His smoking use included cigarettes. He started smoking about 72 years ago. He has a 17 pack-year smoking history. He has never used smokeless tobacco. He reports that he does not drink alcohol  and does not use drugs.  Allergies[1]  Medications: I have reviewed the patient's current medications. Scheduled:  Chlorhexidine  Gluconate Cloth  6 each Topical Daily   clobetasol  ointment  1 Application Topical BID   diphenhydrAMINE -zinc  acetate   Topical BID   heparin   5,000 Units Subcutaneous Q8H   hydrocortisone  sod succinate (SOLU-CORTEF ) inj  50 mg Intravenous Q6H   insulin  aspart  0-6 Units Subcutaneous Q4H   mupirocin  ointment  1 Application Nasal BID    ROS: Unable to provide due to limited speech  Physical Examination: Blood pressure (!) 174/82, pulse 66, temperature 98.1 F (36.7 C), resp. rate 20, height 5' 8 (1.727 m), weight 58.9 kg, SpO2 (!) 80%.  NIHSS components Score: Comment  1a Level of Conscious 0[]  1[x]  2[]  3[]      1b LOC Questions 0[]  1[]  2[x]       1c LOC Commands 0[x]  1[]  2[]       2 Best Gaze 0[x]  1[]  2[]       3 Visual 0[x]  1[]  2[]  3[]      4 Facial  Palsy 0[]  1[]  2[x]  3[]      5a Motor Arm - left 0[]  1[x]  2[]  3[]  4[]  UN[]    5b Motor Arm - Right 0[]  1[]  2[x]  3[]  4[]  UN[]    6a Motor Leg - Left 0[]  1[x]  2[]  3[]  4[]  UN[]    6b Motor Leg - Right 0[]  1[x]  2[]  3[]  4[]  UN[]    7 Limb Ataxia 0[x]  1[]  2[]  3[]  UN[]     8 Sensory 0[x]  1[]  2[]  UN[]      9 Best Language 0[]  1[]  2[x]  3[]      10 Dysarthria 0[]  1[]  2[x]  UN[]      11 Extinct. and Inattention 0[x]  1[]  2[]       TOTAL: 14      Additional Exam findings:  Increased tone and contracture in the RUE.  Minimal to no speech but did nod head appropriately to questions being asked.    Laboratory Studies:   Basic Metabolic Panel: Recent Labs  Lab 05/30/24 1054 05/30/24 2011 05/31/24 0313 06/01/24 0411  06/02/24 0356  NA 149* 151* 150* 153* 157*  K 4.2 3.6 3.6 3.8 3.2*  CL 117* 120* 119* 125* 126*  CO2 13* 15* 15* 15* 16*  GLUCOSE 111* 85 122* 129* 140*  BUN 67* 62* 61* 64* 69*  CREATININE 2.78* 2.43* 2.32* 2.53* 2.56*  CALCIUM  9.3 7.9* 7.9* 8.4* 8.2*  MG  --   --  2.2  --   --   PHOS  --   --  4.6  --   --     Liver Function Tests: Recent Labs  Lab 05/30/24 1054 05/30/24 2011 06/02/24 0356  AST 173* 167* 44*  ALT 470* 344* 181*  ALKPHOS 95 68 99  BILITOT 0.8 0.5 0.3  PROT 5.8* 4.5* 4.7*  ALBUMIN 2.9* 2.5* 2.2*   No results for input(s): LIPASE, AMYLASE in the last 168 hours. No results for input(s): AMMONIA in the last 168 hours.  CBC: Recent Labs  Lab 05/30/24 1054 05/31/24 0313 06/01/24 0625 06/02/24 0356  WBC 11.2* 7.1 8.4 7.7  NEUTROABS 9.6*  --   --   --   HGB 10.4* 7.8* 8.5* 8.0*  HCT 34.0* 24.3* 27.6* 25.6*  MCV 110.4* 105.7* 106.6* 106.7*  PLT 188 125* 145* 139*    Cardiac Enzymes: Recent Labs  Lab 06/02/24 0356  CKTOTAL 38*    BNP: Invalid input(s): POCBNP  CBG: Recent Labs  Lab 06/01/24 2344 06/02/24 0328 06/02/24 0739 06/02/24 1153 06/02/24 1354  GLUCAP 156* 135* 128* 173* 170*    Microbiology: Results for orders placed or performed during the hospital encounter of 05/30/24  Blood Culture (routine x 2)     Status: None (Preliminary result)   Collection Time: 05/30/24 10:54 AM   Specimen: BLOOD  Result Value Ref Range Status   Specimen Description BLOOD BLOOD LEFT ARM  Final   Special Requests   Final    BOTTLES DRAWN AEROBIC AND ANAEROBIC Blood Culture results may not be optimal due to an inadequate volume of blood received in culture bottles   Culture   Final    NO GROWTH 3 DAYS Performed at [facility], [address] [place], [place] [address]    Report Status PENDING  Incomplete  Blood Culture (routine x 2)     Status: None (Preliminary result)   Collection Time: 05/30/24 10:54 AM   Specimen: BLOOD  LEFT HAND  Result Value Ref Range Status   Specimen Description   Final    BLOOD LEFT HAND Performed at [facility], [address] [place], [place] [address]    Special Requests   Final    BOTTLES DRAWN AEROBIC AND ANAEROBIC Blood Culture results may not be optimal due to an inadequate volume of blood received in culture bottles Performed at [facility], [address] [place], [place] [address]    Culture  Setup Time   Final    GRAM POSITIVE COCCI ANAEROBIC BOTTLE ONLY CRITICAL RESULT CALLED TO, READ BACK BY AND VERIFIED WITH: WILL ANDERSON 06/01/24 1449 KLW    Culture   Final    GRAM POSITIVE COCCI IDENTIFICATION AND SUSCEPTIBILITIES TO FOLLOW Performed at [facility], [address] [place], [place] [address]    Report Status PENDING  Incomplete  Resp panel by RT-PCR (RSV, Flu A&B, Covid) Anterior Nasal Swab     Status: None   Collection Time: 05/30/24 10:54 AM   Specimen: Anterior Nasal Swab  Result Value Ref Range Status   SARS Coronavirus 2 by RT PCR NEGATIVE NEGATIVE Final    Comment: (NOTE) SARS-CoV-2 target nucleic acids are NOT DETECTED.  The SARS-CoV-2 RNA is generally detectable in upper respiratory specimens during the acute phase of infection. The lowest concentration of SARS-CoV-2 viral copies this assay can detect is 138 copies/mL. A negative result does not preclude SARS-Cov-2 infection and should not be used as the sole basis for treatment or other patient management decisions. A negative result may occur with  improper specimen collection/handling, submission of specimen other than nasopharyngeal swab, presence of viral mutation(s) within the areas targeted by this assay, and inadequate number of viral copies(<138 copies/mL). A negative result must be combined with clinical observations, patient history, and epidemiological information. The expected result is Negative.  Fact Sheet for Patients:   bloggercourse.com  Fact Sheet for Healthcare Providers:  seriousbroker.it  This test is no t yet approved or cleared by the [place]  FDA and  has been authorized for detection and/or diagnosis of SARS-CoV-2 by FDA under an Emergency Use Authorization (EUA). This EUA will remain  in effect (meaning this test can be used) for the duration of the COVID-19 declaration under Section 564(b)(1) of the Act, 21 U.S.C.section 360bbb-3(b)(1), unless the authorization is terminated  or revoked sooner.       Influenza A by PCR NEGATIVE NEGATIVE Final   Influenza B by PCR NEGATIVE NEGATIVE Final    Comment: (NOTE) The Xpert Xpress SARS-CoV-2/FLU/RSV plus assay is intended as an aid in the diagnosis of influenza from Nasopharyngeal swab specimens and should not be used as a sole basis for treatment. Nasal washings and aspirates are unacceptable for Xpert Xpress SARS-CoV-2/FLU/RSV testing.  Fact Sheet for Patients: bloggercourse.com  Fact Sheet for Healthcare Providers: seriousbroker.it  This test is not yet approved or cleared by the [place]  FDA and has been authorized for detection and/or diagnosis of SARS-CoV-2 by FDA under an Emergency Use Authorization (EUA). This EUA will remain in effect (meaning this test can be used) for the duration of the COVID-19 declaration under Section 564(b)(1) of the Act, 21 U.S.C. section 360bbb-3(b)(1), unless the authorization is terminated or revoked.     Resp Syncytial Virus by PCR NEGATIVE NEGATIVE Final    Comment: (NOTE) Fact Sheet for Patients: bloggercourse.com  Fact Sheet for Healthcare Providers: seriousbroker.it  This test is not yet approved or cleared by the [place]  FDA and has been authorized for detection and/or diagnosis of SARS-CoV-2 by FDA under an Emergency Use  Authorization (EUA). This EUA  will remain in effect (meaning this test can be used) for the duration of the COVID-19 declaration under Section 564(b)(1) of the Act, 21 U.S.C. section 360bbb-3(b)(1), unless the authorization is terminated or revoked.  Performed at [facility], [address] [place], [place] [address]   Blood Culture ID Panel (Reflexed)     Status: Abnormal   Collection Time: 05/30/24 10:54 AM  Result Value Ref Range Status   Enterococcus faecalis NOT DETECTED NOT DETECTED Final   Enterococcus Faecium NOT DETECTED NOT DETECTED Final   Listeria monocytogenes NOT DETECTED NOT DETECTED Final   Staphylococcus species DETECTED (A) NOT DETECTED Final    Comment: CRITICAL RESULT CALLED TO, READ BACK BY AND VERIFIED WITH: WILL ANDERSON 06/01/24 1449 KLW    Staphylococcus aureus (BCID) DETECTED (A) NOT DETECTED Final    Comment: Methicillin (oxacillin)-resistant Staphylococcus aureus (MRSA). MRSA is predictably resistant to beta-lactam antibiotics (except ceftaroline). Preferred therapy is vancomycin  unless clinically contraindicated. Patient requires contact precautions if  hospitalized. CRITICAL RESULT CALLED TO, READ BACK BY AND VERIFIED WITH: WILL ANDERSON 06/01/24 1449 KLW    Staphylococcus epidermidis NOT DETECTED NOT DETECTED Final   Staphylococcus lugdunensis NOT DETECTED NOT DETECTED Final   Streptococcus species NOT DETECTED NOT DETECTED Final   Streptococcus agalactiae NOT DETECTED NOT DETECTED Final   Streptococcus pneumoniae NOT DETECTED NOT DETECTED Final   Streptococcus pyogenes NOT DETECTED NOT DETECTED Final   A.calcoaceticus-baumannii NOT DETECTED NOT DETECTED Final   Bacteroides fragilis NOT DETECTED NOT DETECTED Final   Enterobacterales NOT DETECTED NOT DETECTED Final   Enterobacter cloacae complex NOT DETECTED NOT DETECTED Final   Escherichia coli NOT DETECTED NOT DETECTED Final   Klebsiella aerogenes NOT DETECTED NOT DETECTED Final    Klebsiella oxytoca NOT DETECTED NOT DETECTED Final   Klebsiella pneumoniae NOT DETECTED NOT DETECTED Final   Proteus species NOT DETECTED NOT DETECTED Final   Salmonella species NOT DETECTED NOT DETECTED Final   Serratia marcescens NOT DETECTED NOT DETECTED Final   Haemophilus influenzae NOT DETECTED NOT DETECTED Final   Neisseria meningitidis NOT DETECTED NOT DETECTED Final   Pseudomonas aeruginosa NOT DETECTED NOT DETECTED Final   Stenotrophomonas maltophilia NOT DETECTED NOT DETECTED Final   Candida albicans NOT DETECTED NOT DETECTED Final   Candida auris NOT DETECTED NOT DETECTED Final   Candida glabrata NOT DETECTED NOT DETECTED Final   Candida krusei NOT DETECTED NOT DETECTED Final   Candida parapsilosis NOT DETECTED NOT DETECTED Final   Candida tropicalis NOT DETECTED NOT DETECTED Final   Cryptococcus neoformans/gattii NOT DETECTED NOT DETECTED Final   Meth resistant mecA/C and MREJ DETECTED (A) NOT DETECTED Final    Comment: CRITICAL RESULT CALLED TO, READ BACK BY AND VERIFIED WITH: WILL ANDERSON 06/01/24 1449 KLW Performed at [facility], [address] [place], [place] [address]   MRSA Next Gen by PCR, Nasal     Status: Abnormal   Collection Time: 05/30/24 12:49 PM   Specimen: Nasal Mucosa; Nasal Swab  Result Value Ref Range Status   MRSA by PCR Next Gen DETECTED (A) NOT DETECTED Final    Comment: RESULT CALLED TO, READ BACK BY AND VERIFIED WITH: MYRA FLOWERS 1813 05/30/24 MU (NOTE) The GeneXpert MRSA Assay (FDA approved for NASAL specimens only), is one component of a comprehensive MRSA colonization surveillance program. It is not intended to diagnose MRSA infection nor to guide or monitor treatment for MRSA infections. Test performance is not FDA approved in patients less than 53 years old. Performed at [facility], [address] [place], [place] [address]   Respiratory (~20 pathogens) panel by PCR     Status: Abnormal   Collection Time:  05/30/24  1:55 PM   Specimen: Nasopharyngeal Swab; Respiratory  Result Value Ref Range Status   Adenovirus NOT DETECTED NOT DETECTED Final   Coronavirus 229E NOT DETECTED NOT DETECTED Final    Comment: (NOTE) The Coronavirus on the Respiratory Panel, DOES NOT test for the novel  Coronavirus (2019 nCoV)    Coronavirus HKU1 NOT DETECTED NOT DETECTED Final   Coronavirus NL63 NOT DETECTED NOT DETECTED Final   Coronavirus OC43 NOT DETECTED NOT DETECTED Final   Metapneumovirus DETECTED (A) NOT DETECTED Final   Rhinovirus / Enterovirus DETECTED (A) NOT DETECTED Final   Influenza A NOT DETECTED NOT DETECTED Final   Influenza B NOT DETECTED NOT DETECTED Final   Parainfluenza Virus 1 NOT DETECTED NOT DETECTED Final   Parainfluenza Virus 2 NOT DETECTED NOT DETECTED Final   Parainfluenza Virus 3 NOT DETECTED NOT DETECTED Final   Parainfluenza Virus 4 NOT DETECTED NOT DETECTED Final   Respiratory Syncytial Virus NOT DETECTED NOT DETECTED Final   Bordetella pertussis NOT DETECTED NOT DETECTED Final   Bordetella Parapertussis NOT DETECTED NOT DETECTED Final   Chlamydophila pneumoniae NOT DETECTED NOT DETECTED Final   Mycoplasma pneumoniae NOT DETECTED NOT DETECTED Final    Comment: Performed at [facility], [address], [place], [place] [address]  Culture, blood (single) w Reflex to ID Panel     Status: None (Preliminary result)   Collection Time: 06/01/24  5:20 PM   Specimen: BLOOD  Result Value Ref Range Status   Specimen Description BLOOD BLOOD RIGHT HAND  Final   Special Requests   Final    BOTTLES DRAWN AEROBIC AND ANAEROBIC Blood Culture results may not be optimal due to an inadequate volume of blood received in culture bottles   Culture   Final    NO GROWTH < 24 HOURS Performed at [facility], [address] [place], [place] [address]    Report Status PENDING  Incomplete    Coagulation Studies: No results for input(s): LABPROT, INR in the last 72  hours.  Urinalysis:  Recent Labs  Lab 05/30/24 1341  COLORURINE YELLOW*  LABSPEC 1.014  PHURINE 5.0  GLUCOSEU NEGATIVE  HGBUR NEGATIVE  BILIRUBINUR NEGATIVE  KETONESUR NEGATIVE  PROTEINUR NEGATIVE  NITRITE NEGATIVE  LEUKOCYTESUR NEGATIVE    Lipid Panel:     Component Value Date/Time   CHOL 105 12/13/2021 0428   CHOL 107 11/08/2021 1009   CHOL 123 02/13/2016 0834   TRIG 51 12/13/2021 0428   TRIG 114 02/13/2016 0834   HDL 54 12/13/2021 0428   HDL 51 11/08/2021 1009   CHOLHDL 1.9 12/13/2021 0428   VLDL 10 12/13/2021 0428   VLDL 23 02/13/2016 0834   LDLCALC 41 12/13/2021 0428   LDLCALC 41 11/08/2021 1009    HgbA1C:  Lab Results  Component Value Date   HGBA1C 5.3 05/30/2024    Urine Drug Screen:      Component Value Date/Time   LABOPIA NONE DETECTED 12/12/2021 1114   COCAINSCRNUR NONE DETECTED 12/12/2021 1114   LABBENZ NONE DETECTED 12/12/2021 1114   AMPHETMU NONE DETECTED 12/12/2021 1114   THCU NONE DETECTED 12/12/2021 1114   LABBARB NONE DETECTED 12/12/2021 1114    Alcohol  Level: No results for input(s): ETH in the last 168 hours.  Other results: EKG: atrial fibrillation, rate 114 bpm.  Imaging: ECHOCARDIOGRAM  COMPLETE Result Date: 06/02/2024    ECHOCARDIOGRAM REPORT   Patient Name:   ODEAN FESTER Date of Exam: 06/01/2024 Medical Rec #:  [id number]         Height:       68.0 in Accession #:    [id number]        Weight:       126.8 lb Date of Birth:  [birth year]         BSA:          1.684 m Patient Age:    89 years          BP:           112/71 mmHg Patient Gender: M                 HR:           91 bpm. Exam Location:  [facility] Procedure: 2D Echo, Cardiac Doppler and Color Doppler (Both Spectral and Color            Flow Doppler were utilized during procedure). Indications:     R78.81 Bacteremia.  History:         Patient has prior history of Echocardiogram examinations, most                  recent 12/12/2021. CAD; Risk Factors:Hypertension and                   Dyslipidemia.  Sonographer:     Carl Coma RDCS Referring Phys:  [id number] DAVID P FITZGERALD Diagnosing Phys: Evalene Lunger MD IMPRESSIONS  1. No valve vegetation noted  2. Left ventricular ejection fraction, by estimation, is 55 to 60%. The left ventricle has normal function. The left ventricle has no regional wall motion abnormalities. There is mild left ventricular hypertrophy. Left ventricular diastolic parameters are consistent with Grade I diastolic dysfunction (impaired relaxation).  3. Right ventricular systolic function is normal. The right ventricular size is normal. There is normal pulmonary artery systolic pressure. The estimated right ventricular systolic pressure is 33.5 mmHg.  4. The mitral valve is normal in structure. Mild mitral valve regurgitation. No evidence of mitral stenosis. Moderate mitral annular calcification.  5. The aortic valve is calcified. Aortic valve regurgitation is not visualized. Mild to moderate aortic valve stenosis. Aortic valve area, by VTI measures 1.54 cm. Aortic valve mean gradient measures 14.0 mmHg. Aortic valve Vmax measures 2.51 m/s.  6. There is moderate dilatation of the ascending aorta, measuring 46 mm.  7. The inferior vena cava is normal in size with greater than 50% respiratory variability, suggesting right atrial pressure of 3 mmHg.  8. Frequent PVCs FINDINGS  Left Ventricle: Left ventricular ejection fraction, by estimation, is 55 to 60%. The left ventricle has normal function. The left ventricle has no regional wall motion abnormalities. Strain was performed and the global longitudinal strain is indeterminate. The left ventricular internal cavity size was normal in size. There is mild left ventricular hypertrophy. Left ventricular diastolic parameters are consistent with Grade I diastolic dysfunction (impaired relaxation). Right Ventricle: The right ventricular size is normal. No increase in right ventricular wall thickness. Right ventricular  systolic function is normal. There is normal pulmonary artery systolic pressure. The tricuspid regurgitant velocity is 2.67 m/s, and  with an assumed right atrial pressure of 5 mmHg, the estimated right ventricular systolic pressure is 33.5 mmHg. Left Atrium: Left atrial size was normal in size. Right Atrium: Right atrial size was normal in size.  Pericardium: There is no evidence of pericardial effusion. Mitral Valve: The mitral valve is normal in structure. There is mild calcification of the mitral valve leaflet(s). Moderate mitral annular calcification. Mild mitral valve regurgitation. No evidence of mitral valve stenosis. Tricuspid Valve: The tricuspid valve is normal in structure. Tricuspid valve regurgitation is mild . No evidence of tricuspid stenosis. The aortic valve is calcified. Aortic valve regurgitation is not visualized. Mild to moderate aortic stenosis is present. Pulmonic Valve: The pulmonic valve was normal in structure. Pulmonic valve regurgitation is not visualized. No evidence of pulmonic stenosis. Aorta: The aortic root is normal in size and structure. There is moderate dilatation of the ascending aorta, measuring 46 mm. Venous: The inferior vena cava is normal in size with greater than 50% respiratory variability, suggesting right atrial pressure of 3 mmHg. IAS/Shunts: No atrial level shunt detected by color flow Doppler. Additional Comments: 3D was performed not requiring image post processing on an independent workstation and was indeterminate.  LEFT VENTRICLE PLAX 2D LVIDd:         4.50 cm   Diastology LVIDs:         2.30 cm   LV e' medial:    4.90 cm/s LV PW:         0.80 cm   LV E/e' medial:  16.0 LV IVS:        0.90 cm   LV e' lateral:   5.87 cm/s LVOT diam:     2.30 cm   LV E/e' lateral: 13.3 LV SV:         66 LV SV Index:   39 LVOT Area:     4.15 cm  RIGHT VENTRICLE RV Basal diam:  3.30 cm RV S prime:     12.97 cm/s TAPSE (M-mode): 2.4 cm LEFT ATRIUM           Index        RIGHT ATRIUM           Index LA diam:      4.30 cm 2.55 cm/m   RA Area:     9.52 cm LA Vol (A2C): 36.3 ml 21.56 ml/m  RA Volume:   17.40 ml 10.33 ml/m LA Vol (A4C): 65.5 ml 38.90 ml/m  AORTIC VALVE AV Area (Vmax):    1.41 cm AV Area (Vmean):   1.54 cm AV Area (VTI):     1.54 cm AV Vmax:           250.75 cm/s AV Vmean:          174.250 cm/s AV VTI:            0.432 m AV Peak Grad:      25.2 mmHg AV Mean Grad:      14.0 mmHg LVOT Vmax:         85.23 cm/s LVOT Vmean:        64.500 cm/s LVOT VTI:          0.160 m LVOT/AV VTI ratio: 0.37  AORTA Ao Root diam: 4.00 cm Ao Asc diam:  4.60 cm MITRAL VALVE                TRICUSPID VALVE MV Area (PHT): 3.30 cm     TR Peak grad:   28.5 mmHg MV Decel Time: 230 msec     TR Vmax:        267.00 cm/s MV E velocity: 78.25 cm/s MV A velocity: 135.50 cm/s  SHUNTS MV E/A ratio:  0.58  Systemic VTI:  0.16 m                             Systemic Diam: 2.30 cm Evalene Lunger MD Electronically signed by Evalene Lunger MD Signature Date/Time: 06/02/2024/1:00:41 PM    Final      Assessment/Plan: 89 y.o. male with a history of stroke (residual right sided weakness) CAD, GIB, HLD, HTN who was admitted on 12/8 with sepsis/PNA.  Altered at that time and has remained altered.  Today transferred out of the ICU and was noted today to be less responsive.  Pupils small and unreactive.  Code stroke called at that time.  LKW time unable to be determined and likely that patient is still encephalopathic from his multiple metabolic abnormalities and infection therefore code stroke cancelled.   From review of ICU notes, patient actually better at this time on my examination.  Head CT performed on 12/9 for patient remaining altered.  Head CT reviewed and shows no acute changes.  Patient remains quite ill though and has a history of stroke.  Both decrease the seizure threshold and will investigate this further.    Recommendations: Head CT without contrast EEG  Case discussed with Dr.  Marsa Sonny Hock, MD Neurology  06/02/2024, 2:37 PM          [1]  Allergies Allergen Reactions   Other     Patient states that he is allergic to something that they place in the IV before they work on you   Simvastatin  Other (See Comments)    Myalgia

## 2024-06-02 NOTE — Plan of Care (Signed)
°  Problem: Skin Integrity: Goal: Risk for impaired skin integrity will decrease Outcome: Progressing   Problem: Tissue Perfusion: Goal: Adequacy of tissue perfusion will improve Outcome: Progressing   Problem: Clinical Measurements: Goal: Will remain free from infection Outcome: Progressing Goal: Diagnostic test results will improve Outcome: Progressing   Problem: Nutritional: Goal: Maintenance of adequate nutrition will improve Outcome: Not Progressing Goal: Progress toward achieving an optimal weight will improve Outcome: Not Progressing

## 2024-06-02 NOTE — Progress Notes (Signed)
 1135 Report called to Elona RN on 2C. 1200 Transferred via bed to 207.

## 2024-06-02 NOTE — Progress Notes (Signed)
 Speech Language Pathology Treatment: Dysphagia  Patient Details Name: Nicholas Gross MRN: [id number] DOB: 1928/04/17 Today's Date: 06/02/2024 Time: 0813-0821 SLP Time Calculation (min) (ACUTE ONLY): 8 min  Assessment / Plan / Recommendation Clinical Impression  Pt seen for ongoing PO readiness. Pt laying with eyes open and fixed stare. Despite heavy verbal and physical (sternal rub) stimulation pt didn't change his gaze or respond. As a result, pt continues to be inappropriate for PO consumption and is not able to participate in skilled ST services. Secure chat sent to pt's treatment team with update. At this time, our services will sign off. Please re-consult should pt improve.    HPI HPI: 89 year old male presenting to the hospital (05/30/2024) from his nursing facility with increased work of breathing and shortness of breath. Viral respiratory panel positive for metapneumovirus and rhinovirus.  Patient with elevated creatinine and sodium. Chest CT (05/30/2024) revealed There is multifocal bilateral airspace disease, most pronounced within the right upper, right lower, and left lower lobes, consistent with bilateral bronchopneumonia. Trace right pleural effusion. Pt is known to ST services for treatment of dysphagia from previous admissions. Family at bedside report more recent diet at [facility] was dysphagia 1 with thin liquids. They report unintentional weight loss and decreased PO consumption over the last month with increased coughing during consumption of thin liquids at facility.  05/31/2024 Head CT 1. No acute intracranial abnormality.  2. Diffuse cerebral parenchymal volume loss.  3. Moderate chronic ischemic microvascular disease.      SLP Plan  Discharge SLP treatment due to (comment) (no progress made)        Swallow Evaluation Recommendations   Recommendations: NPO Medication Administration: Via alternative means     Recommendations                     Oral  care QID     Dysphagia, unspecified (R13.10)     Discharge SLP treatment due to (comment) (no progress made)   Arriel Victor B. Rubbie, M.S., CCC-SLP, Tree Surgeon Certified Brain Injury Specialist [facility]  [facility] Rehabilitation Services Office [phone] Ascom [phone] Fax [phone]

## 2024-06-02 NOTE — Progress Notes (Incomplete Revision)
 0800  1135 Report called to Elona RN on 2C. 1200 Transferred via bed to 207.

## 2024-06-02 NOTE — Progress Notes (Signed)
 PROGRESS NOTE   YVAN DORITY   FMW:[id number] DOB: September 04, 1927  DOA: 05/30/2024 Date of Service: 06/02/2024 which is hospital day 3  PCP: Vicci [name], [facility] course / significant events:   89 year old male presenting to the hospital from his nursing facility with AMS, SOB.   HPI: Family reports that the patient had symptoms of shortness of breath and oxygen requirements around a week ago that improved a few days ago only to worsen over the past couple of days.  They report that he was noted to have increased work of breathing and increased oxygen requirements at his nursing facility prompting transfer to the emergency department.  Family reported the patient has not had similar symptoms recently and he is not oxygen dependent.  They report that he is mostly bedbound and sometimes is in a wheelchair.  He requires help with almost all of his activities of daily living.  He is able to hold a conversation but is not as verbal as he was in the past.   12/08: On presentation to the ED, acute hypoxic resp faiol, AKI Cr 2.78 and a BUN of 67. Elevated LFT, hypernatremia (149), lactic acidosis of 4.4.  CXR RLL PNA. COVID, influenza, and RSV PCR was negative.  Blood cultures were drawn. Start IV antibiotics with ceftriaxone  and azithromycin . IV fluids. Patient is DNR/DNI based on his wishes and a signed MOLST form. ICU team was consulted for consideration of admission to the ICU in case needing pressors. 12/09: Hospitalist team assumes care. Patient able to follow some simple commands like opening his mouth and wiggling his toes.  Mental status still not back to baseline.  Unable to pass swallow eval.  Will get palliative care consultation.  Viral respiratory panel positive for metapneumovirus and rhinovirus.   Continue empiric antibiotics for pneumonia. Patient with elevated creatinine 2.3 and sodium 150 12/10: Cr 2.53, Na 153, still not waking up to eat / not following commands. (+)MRSA  BCx, ID recs continue abx escalate linezolid  + ceftriaxone  + azithro + daptomycin .  12/11: more alert this afternoon, following commands, still not eating but SLP to revisit tomorrow. D/w family and palliative team today, continue current care, likely do not want to put him thorugh TEE, open to PICC and abx if his appetite is improving as long as he isn't suffering but niece notes she suspects 'his time is close' - will continue to revisit goals pending clinical progression       Consultants:  ICU on admission Palliative Care  Infectious disease   Procedures/Surgeries: none      ASSESSMENT & PLAN:   Severe sepsis Present on admission  multifocal pneumonia - Unclear if viral or bacterial.  Has 2 viruses positive on viral respiratory panel but also very sick End organ dysfunction: acute respiratory failure, acute kidney injury, altered mental status, hypotension, tachycardia and tachypnea.   aggressive antibiotics - ID recs continue abx escalate linezolid  + ceftriaxone  + azithro + daptomycin .  Given stress dose steroid.   Acute respiratory failure with hypoxia   D/t pneumonia as above  Had a pulse ox of 81% on 5 L on admisison.   Taper oxygen as needed.    AKI (acute kidney injury) Creatinine 2.53 today, creatinine 2.78 on presentation.   Continue IV fluids.   Acute metabolic encephalopathy Unable to pass swallow eval yesterday or today.  CT notes diffuse cerebral parenchymal volume loss and moderate chronic ischemic disease  Delirium and fall precautions  if  not improving then consider for comfort measures more alert this afternoon, following commands, still not eating but SLP to revisit tomorrow. D/w family and palliative team today, continue current care, likely do not want to put him thorugh TEE, open to PICC and abx if his appetite is improving as long as he isn't suffering but niece notes she suspects 'his time is close' - will continue to revisit goals pending clinical  progression   Right arm contracted CT head yesterday non-acute Monitor   Bullous pemphigoid On chronic steroids    Hypernatremia IV fluid hydration Monitor BMP   High anion gap metabolic acidosis on admission AG now normal but Cl high and CO2 low  Likely secondary to infection and acute kidney injury Monitor BMP Treat underlying cause(s).   Elevated liver function tests on admission Question d/t shock liver, levels are trending down w/ tx as above  Monitor LFT   Thrombocytopenia Secondary to severe sepsis Monitor CBC   Iron  deficiency anemia No apparent ABLA Likely some diffusion component given high volume IV fluids received Monitor CBC May end up needing a blood transfusion during the hospital course.   Itching Benadryl  cream         Borderline underweight based on BMI: Body mass index is 19.27 kg/m.SABRA Significantly low or high BMI is associated with higher medical risk.  Underweight - under 18  overweight - 25 to 29 obese - 30 or more Class 1 obesity: BMI of 30.0 to 34 Class 2 obesity: BMI of 35.0 to 39 Class 3 obesity: BMI of 40.0 to 49 Super Morbid Obesity: BMI 50-59 Super-super Morbid Obesity: BMI 60+ Healthy nutrition and physical activity advised as adjunct to other disease management and risk reduction treatments    DVT prophylaxis: heparin  IV fluids: no continuous IV fluids  Nutrition: D5LR maintenance fluids  Central lines / other devices: none  Code Status: DNR/DNI ACP documentation reviewed: has DNR order on file in [facility]  TOC needs: TBD Medical barriers to dispo: sepsis / bacteremia. Expected medical readiness for discharge pending clinical course.              Subjective / Brief ROS:  Patient not contributory other than some yes/no answers   Family Communication: bedside discussion w/ niece Tammy and her husband     Objective Findings:  Vitals:   06/02/24 1325 06/02/24 1427 06/02/24 1430 06/02/24 1701  BP: (!)  156/94 (!) 174/82  (!) 115/91  Pulse: (!) 138 66 63 68  Resp: (!) 25 20  20   Temp:    (!) 97.4 F (36.3 C)  TempSrc:      SpO2: 92% (!) 80% 100% 97%  Weight:      Height:        Intake/Output Summary (Last 24 hours) at 06/02/2024 1850 Last data filed at 06/02/2024 1214 Gross per 24 hour  Intake 1139.71 ml  Output 650 ml  Net 489.71 ml   Filed Weights   05/31/24 0500 06/01/24 0500 06/02/24 0500  Weight: 59.5 kg 57.5 kg 58.9 kg    Examination:  Physical Exam Constitutional:      General: He is not in acute distress.    Comments: Alert / eyes open but not tracking or following commands, not verbalizing  Eyes:     Conjunctiva/sclera: Conjunctivae normal.  Cardiovascular:     Rate and Rhythm: Normal rate and regular rhythm.  Pulmonary:     Effort: Pulmonary effort is normal.     Breath sounds: Normal breath sounds.  Abdominal:     General: Bowel sounds are normal.     Palpations: Abdomen is soft.  Musculoskeletal:     Right lower leg: No edema.     Left lower leg: No edema.  Skin:    General: Skin is warm and dry.  Neurological:     Mental Status: He is alert.     Comments: Following commands, answers some ys/no questions but cannot give detail   Psychiatric:        Behavior: Behavior normal.          Scheduled Medications:   Chlorhexidine  Gluconate Cloth  6 each Topical Daily   clobetasol  ointment  1 Application Topical BID   diphenhydrAMINE -zinc  acetate   Topical BID   heparin   5,000 Units Subcutaneous Q8H   hydrocortisone  sod succinate (SOLU-CORTEF ) inj  50 mg Intravenous Q6H   insulin  aspart  0-6 Units Subcutaneous Q4H   mupirocin  ointment  1 Application Nasal BID    Continuous Infusions:  azithromycin  500 mg (06/02/24 1606)   cefTRIAXone  (ROCEPHIN )  IV 2 g (06/02/24 1214)   DAPTOmycin  Stopped (06/01/24 1829)   dextrose  5% lactated ringers  with KCl 20 mEq/L 75 mL/hr at 06/02/24 1605   linezolid  (ZYVOX ) IV 600 mg (06/02/24 0951)    PRN  Medications:    Antimicrobials from admission:  Anti-infectives (From admission, onward)    Start     Dose/Rate Route Frequency Ordered Stop   06/01/24 1800  DAPTOmycin  (CUBICIN ) IVPB 500 mg/48mL premix        8 mg/kg  57.5 kg 100 mL/hr over 30 Minutes Intravenous Every 48 hours 06/01/24 1539     05/31/24 1300  azithromycin  (ZITHROMAX ) 500 mg in sodium chloride  0.9 % 250 mL IVPB        500 mg 250 mL/hr over 60 Minutes Intravenous Every 24 hours 05/30/24 1250     05/31/24 1200  cefTRIAXone  (ROCEPHIN ) 2 g in sodium chloride  0.9 % 100 mL IVPB        2 g 200 mL/hr over 30 Minutes Intravenous Every 24 hours 05/30/24 1250     05/30/24 2200  linezolid  (ZYVOX ) IVPB 600 mg        600 mg 300 mL/hr over 60 Minutes Intravenous Every 12 hours 05/30/24 1906     05/30/24 1215  cefTRIAXone  (ROCEPHIN ) 2 g in sodium chloride  0.9 % 100 mL IVPB        2 g 200 mL/hr over 30 Minutes Intravenous Once 05/30/24 1209 05/30/24 1350   05/30/24 1215  azithromycin  (ZITHROMAX ) 500 mg in sodium chloride  0.9 % 250 mL IVPB        500 mg 250 mL/hr over 60 Minutes Intravenous  Once 05/30/24 1209 05/30/24 1534           Data Reviewed:  I have personally reviewed the following...  CBC: Recent Labs  Lab 05/30/24 1054 05/31/24 0313 06/01/24 0625 06/02/24 0356  WBC 11.2* 7.1 8.4 7.7  NEUTROABS 9.6*  --   --   --   HGB 10.4* 7.8* 8.5* 8.0*  HCT 34.0* 24.3* 27.6* 25.6*  MCV 110.4* 105.7* 106.6* 106.7*  PLT 188 125* 145* 139*   Basic Metabolic Panel: Recent Labs  Lab 05/30/24 1054 05/30/24 2011 05/31/24 0313 06/01/24 0411 06/02/24 0356  NA 149* 151* 150* 153* 157*  K 4.2 3.6 3.6 3.8 3.2*  CL 117* 120* 119* 125* 126*  CO2 13* 15* 15* 15* 16*  GLUCOSE 111* 85 122* 129* 140*  BUN 67* 62* 61*  64* 69*  CREATININE 2.78* 2.43* 2.32* 2.53* 2.56*  CALCIUM  9.3 7.9* 7.9* 8.4* 8.2*  MG  --   --  2.2  --   --   PHOS  --   --  4.6  --   --    GFR: Estimated Creatinine Clearance: 14.7 mL/min (A) (by C-G  formula based on SCr of 2.56 mg/dL (H)). Liver Function Tests: Recent Labs  Lab 05/30/24 1054 05/30/24 2011 06/02/24 0356  AST 173* 167* 44*  ALT 470* 344* 181*  ALKPHOS 95 68 99  BILITOT 0.8 0.5 0.3  PROT 5.8* 4.5* 4.7*  ALBUMIN 2.9* 2.5* 2.2*   No results for input(s): LIPASE, AMYLASE in the last 168 hours. No results for input(s): AMMONIA in the last 168 hours. Coagulation Profile: Recent Labs  Lab 05/30/24 1235  INR 1.5*   Cardiac Enzymes: Recent Labs  Lab 06/02/24 0356  CKTOTAL 38*   BNP (last 3 results) No results for input(s): PROBNP in the last 8760 hours. HbA1C: No results for input(s): HGBA1C in the last 72 hours.  CBG: Recent Labs  Lab 06/02/24 0328 06/02/24 0739 06/02/24 1153 06/02/24 1354 06/02/24 1659  GLUCAP 135* 128* 173* 170* 154*   Lipid Profile: No results for input(s): CHOL, HDL, LDLCALC, TRIG, CHOLHDL, LDLDIRECT in the last 72 hours. Thyroid  Function Tests: No results for input(s): TSH, T4TOTAL, FREET4, T3FREE, THYROIDAB in the last 72 hours. Anemia Panel: No results for input(s): VITAMINB12, FOLATE, FERRITIN, TIBC, IRON , RETICCTPCT in the last 72 hours. Most Recent Urinalysis On File:     Component Value Date/Time   COLORURINE YELLOW (A) 05/30/2024 1341   APPEARANCEUR HAZY (A) 05/30/2024 1341   APPEARANCEUR Clear 02/02/2023 1632   LABSPEC 1.014 05/30/2024 1341   PHURINE 5.0 05/30/2024 1341   GLUCOSEU NEGATIVE 05/30/2024 1341   HGBUR NEGATIVE 05/30/2024 1341   BILIRUBINUR NEGATIVE 05/30/2024 1341   BILIRUBINUR Negative 02/02/2023 1632   KETONESUR NEGATIVE 05/30/2024 1341   PROTEINUR NEGATIVE 05/30/2024 1341   NITRITE NEGATIVE 05/30/2024 1341   LEUKOCYTESUR NEGATIVE 05/30/2024 1341   Sepsis Labs: @LABRCNTIP (procalcitonin:4,lacticidven:4) Microbiology: Recent Results (from the past 240 hours)  Blood Culture (routine x 2)     Status: None (Preliminary result)   Collection Time:  05/30/24 10:54 AM   Specimen: BLOOD  Result Value Ref Range Status   Specimen Description BLOOD BLOOD LEFT ARM  Final   Special Requests   Final    BOTTLES DRAWN AEROBIC AND ANAEROBIC Blood Culture results may not be optimal due to an inadequate volume of blood received in culture bottles   Culture   Final    NO GROWTH 3 DAYS Performed at [facility], [address] [place], [place] [address]    Report Status PENDING  Incomplete  Blood Culture (routine x 2)     Status: Abnormal (Preliminary result)   Collection Time: 05/30/24 10:54 AM   Specimen: BLOOD LEFT HAND  Result Value Ref Range Status   Specimen Description   Final    BLOOD LEFT HAND Performed at [facility], [address], [place], [place] [address]    Special Requests   Final    BOTTLES DRAWN AEROBIC AND ANAEROBIC Blood Culture results may not be optimal due to an inadequate volume of blood received in culture bottles Performed at [facility], [address], [place], [place] [address]    Culture  Setup Time   Final    GRAM POSITIVE COCCI ANAEROBIC BOTTLE ONLY CRITICAL RESULT CALLED TO,  READ BACK BY AND VERIFIED WITH: WILL ANDERSON 06/01/24 1449 KLW    Culture (A)  Final    STAPHYLOCOCCUS AUREUS SUSCEPTIBILITIES TO FOLLOW Performed at [facility], [address], [place], [place] [address]    Report Status PENDING  Incomplete  Resp panel by RT-PCR (RSV, Flu A&B, Covid) Anterior Nasal Swab     Status: None   Collection Time: 05/30/24 10:54 AM   Specimen: Anterior Nasal Swab  Result Value Ref Range Status   SARS Coronavirus 2 by RT PCR NEGATIVE NEGATIVE Final    Comment: (NOTE) SARS-CoV-2 target nucleic acids are NOT DETECTED.  The SARS-CoV-2 RNA is generally detectable in upper respiratory specimens during the acute phase of infection. The lowest concentration of SARS-CoV-2 viral copies this assay can detect is 138 copies/mL. A negative result does not preclude  SARS-Cov-2 infection and should not be used as the sole basis for treatment or other patient management decisions. A negative result may occur with  improper specimen collection/handling, submission of specimen other than nasopharyngeal swab, presence of viral mutation(s) within the areas targeted by this assay, and inadequate number of viral copies(<138 copies/mL). A negative result must be combined with clinical observations, patient history, and epidemiological information. The expected result is Negative.  Fact Sheet for Patients:  bloggercourse.com  Fact Sheet for Healthcare Providers:  seriousbroker.it  This test is no t yet approved or cleared by the [place]  FDA and  has been authorized for detection and/or diagnosis of SARS-CoV-2 by FDA under an Emergency Use Authorization (EUA). This EUA will remain  in effect (meaning this test can be used) for the duration of the COVID-19 declaration under Section 564(b)(1) of the Act, 21 U.S.C.section 360bbb-3(b)(1), unless the authorization is terminated  or revoked sooner.       Influenza A by PCR NEGATIVE NEGATIVE Final   Influenza B by PCR NEGATIVE NEGATIVE Final    Comment: (NOTE) The Xpert Xpress SARS-CoV-2/FLU/RSV plus assay is intended as an aid in the diagnosis of influenza from Nasopharyngeal swab specimens and should not be used as a sole basis for treatment. Nasal washings and aspirates are unacceptable for Xpert Xpress SARS-CoV-2/FLU/RSV testing.  Fact Sheet for Patients: bloggercourse.com  Fact Sheet for Healthcare Providers: seriousbroker.it  This test is not yet approved or cleared by the [place]  FDA and has been authorized for detection and/or diagnosis of SARS-CoV-2 by FDA under an Emergency Use Authorization (EUA). This EUA will remain in effect (meaning this test can be used) for the duration of  the COVID-19 declaration under Section 564(b)(1) of the Act, 21 U.S.C. section 360bbb-3(b)(1), unless the authorization is terminated or revoked.     Resp Syncytial Virus by PCR NEGATIVE NEGATIVE Final    Comment: (NOTE) Fact Sheet for Patients: bloggercourse.com  Fact Sheet for Healthcare Providers: seriousbroker.it  This test is not yet approved or cleared by the [place]  FDA and has been authorized for detection and/or diagnosis of SARS-CoV-2 by FDA under an Emergency Use Authorization (EUA). This EUA will remain in effect (meaning this test can be used) for the duration of the COVID-19 declaration under Section 564(b)(1) of the Act, 21 U.S.C. section 360bbb-3(b)(1), unless the authorization is terminated or revoked.  Performed at [facility], [address] [place], [place] [address]   Blood Culture ID Panel (Reflexed)     Status: Abnormal   Collection Time: 05/30/24 10:54 AM  Result Value Ref Range Status   Enterococcus faecalis NOT DETECTED NOT DETECTED Final  Enterococcus Faecium NOT DETECTED NOT DETECTED Final   Listeria monocytogenes NOT DETECTED NOT DETECTED Final   Staphylococcus species DETECTED (A) NOT DETECTED Final    Comment: CRITICAL RESULT CALLED TO, READ BACK BY AND VERIFIED WITH: WILL ANDERSON 06/01/24 1449 KLW    Staphylococcus aureus (BCID) DETECTED (A) NOT DETECTED Final    Comment: Methicillin (oxacillin)-resistant Staphylococcus aureus (MRSA). MRSA is predictably resistant to beta-lactam antibiotics (except ceftaroline). Preferred therapy is vancomycin  unless clinically contraindicated. Patient requires contact precautions if  hospitalized. CRITICAL RESULT CALLED TO, READ BACK BY AND VERIFIED WITH: WILL ANDERSON 06/01/24 1449 KLW    Staphylococcus epidermidis NOT DETECTED NOT DETECTED Final   Staphylococcus lugdunensis NOT DETECTED NOT DETECTED Final   Streptococcus species NOT  DETECTED NOT DETECTED Final   Streptococcus agalactiae NOT DETECTED NOT DETECTED Final   Streptococcus pneumoniae NOT DETECTED NOT DETECTED Final   Streptococcus pyogenes NOT DETECTED NOT DETECTED Final   A.calcoaceticus-baumannii NOT DETECTED NOT DETECTED Final   Bacteroides fragilis NOT DETECTED NOT DETECTED Final   Enterobacterales NOT DETECTED NOT DETECTED Final   Enterobacter cloacae complex NOT DETECTED NOT DETECTED Final   Escherichia coli NOT DETECTED NOT DETECTED Final   Klebsiella aerogenes NOT DETECTED NOT DETECTED Final   Klebsiella oxytoca NOT DETECTED NOT DETECTED Final   Klebsiella pneumoniae NOT DETECTED NOT DETECTED Final   Proteus species NOT DETECTED NOT DETECTED Final   Salmonella species NOT DETECTED NOT DETECTED Final   Serratia marcescens NOT DETECTED NOT DETECTED Final   Haemophilus influenzae NOT DETECTED NOT DETECTED Final   Neisseria meningitidis NOT DETECTED NOT DETECTED Final   Pseudomonas aeruginosa NOT DETECTED NOT DETECTED Final   Stenotrophomonas maltophilia NOT DETECTED NOT DETECTED Final   Candida albicans NOT DETECTED NOT DETECTED Final   Candida auris NOT DETECTED NOT DETECTED Final   Candida glabrata NOT DETECTED NOT DETECTED Final   Candida krusei NOT DETECTED NOT DETECTED Final   Candida parapsilosis NOT DETECTED NOT DETECTED Final   Candida tropicalis NOT DETECTED NOT DETECTED Final   Cryptococcus neoformans/gattii NOT DETECTED NOT DETECTED Final   Meth resistant mecA/C and MREJ DETECTED (A) NOT DETECTED Final    Comment: CRITICAL RESULT CALLED TO, READ BACK BY AND VERIFIED WITH: WILL ANDERSON 06/01/24 1449 KLW Performed at [facility], [address] [place], [place] [address]   MRSA Next Gen by PCR, Nasal     Status: Abnormal   Collection Time: 05/30/24 12:49 PM   Specimen: Nasal Mucosa; Nasal Swab  Result Value Ref Range Status   MRSA by PCR Next Gen DETECTED (A) NOT DETECTED Final    Comment: RESULT CALLED TO, READ BACK BY  AND VERIFIED WITH: MYRA FLOWERS 1813 05/30/24 MU (NOTE) The GeneXpert MRSA Assay (FDA approved for NASAL specimens only), is one component of a comprehensive MRSA colonization surveillance program. It is not intended to diagnose MRSA infection nor to guide or monitor treatment for MRSA infections. Test performance is not FDA approved in patients less than 21 years old. Performed at [facility], [address] [place], [place] [address]   Respiratory (~20 pathogens) panel by PCR     Status: Abnormal   Collection Time: 05/30/24  1:55 PM   Specimen: Nasopharyngeal Swab; Respiratory  Result Value Ref Range Status   Adenovirus NOT DETECTED NOT DETECTED Final   Coronavirus 229E NOT DETECTED NOT DETECTED Final    Comment: (NOTE) The Coronavirus on the Respiratory Panel, DOES NOT test for the novel  Coronavirus (2019 nCoV)  Coronavirus HKU1 NOT DETECTED NOT DETECTED Final   Coronavirus NL63 NOT DETECTED NOT DETECTED Final   Coronavirus OC43 NOT DETECTED NOT DETECTED Final   Metapneumovirus DETECTED (A) NOT DETECTED Final   Rhinovirus / Enterovirus DETECTED (A) NOT DETECTED Final   Influenza A NOT DETECTED NOT DETECTED Final   Influenza B NOT DETECTED NOT DETECTED Final   Parainfluenza Virus 1 NOT DETECTED NOT DETECTED Final   Parainfluenza Virus 2 NOT DETECTED NOT DETECTED Final   Parainfluenza Virus 3 NOT DETECTED NOT DETECTED Final   Parainfluenza Virus 4 NOT DETECTED NOT DETECTED Final   Respiratory Syncytial Virus NOT DETECTED NOT DETECTED Final   Bordetella pertussis NOT DETECTED NOT DETECTED Final   Bordetella Parapertussis NOT DETECTED NOT DETECTED Final   Chlamydophila pneumoniae NOT DETECTED NOT DETECTED Final   Mycoplasma pneumoniae NOT DETECTED NOT DETECTED Final    Comment: Performed at [facility], [address] [place], [place] [address]  Culture, blood (single) w Reflex to ID Panel     Status: None (Preliminary result)   Collection Time: 06/01/24   5:20 PM   Specimen: BLOOD  Result Value Ref Range Status   Specimen Description BLOOD BLOOD RIGHT HAND  Final   Special Requests   Final    BOTTLES DRAWN AEROBIC AND ANAEROBIC Blood Culture results may not be optimal due to an inadequate volume of blood received in culture bottles   Culture   Final    NO GROWTH < 24 HOURS Performed at [facility], [address] [place], [place] [address]    Report Status PENDING  Incomplete      Radiology Studies last 3 days: CT HEAD WO CONTRAST ( ) Result Date: 06/02/2024 EXAM: CT HEAD WITHOUT 06/02/2024 02:53:51 PM TECHNIQUE: CT of the head was performed without the administration of intravenous contrast. Automated exposure control, iterative reconstruction, and/or weight based adjustment of the mA/kV was utilized to reduce the radiation dose to as low as reasonably achievable. COMPARISON: Head CT 05/31/2024 and MRI 09/29/2023. CLINICAL HISTORY: Mental status change, persistent or worsening. FINDINGS: BRAIN AND VENTRICLES: There is no evidence of an acute infarct, intracranial hemorrhage, mass, midline shift, hydrocephalus, or extra-axial fluid collection. There is mild to moderate cerebral atrophy. Asymmetric volume loss is again noted in the mesial left temporal lobe. Patchy cerebral white matter hypodensities are unchanged and nonspecific but compatible with mild chronic small vessel ischemic disease. Calcified atherosclerosis at the skull base. ORBITS: Bilateral cataract extraction. SINUSES AND MASTOIDS: Mild mucosal thickening in the ethmoid sinuses. Minimal bilateral mastoid fluid. SOFT TISSUES AND SKULL: No acute skull fracture. No acute soft tissue abnormality. IMPRESSION: 1. No acute intracranial abnormality. 2. Mild chronic small vessel ischemic disease. Electronically signed by: Dasie Hamburg MD 06/02/2024 04:18 PM EST RP Workstation: [id number]   ECHOCARDIOGRAM COMPLETE Result Date: 06/02/2024    ECHOCARDIOGRAM REPORT   Patient Name:    KERRICK MILER Date of Exam: 06/01/2024 Medical Rec #:  [id number]         Height:       68.0 in Accession #:    [id number]        Weight:       126.8 lb Date of Birth:  01-02-1928         BSA:          1.684 m Patient Age:    89 years          BP:           112/71  mmHg Patient Gender: M                 HR:           91 bpm. Exam Location:  [facility] Procedure: 2D Echo, Cardiac Doppler and Color Doppler (Both Spectral and Color            Flow Doppler were utilized during procedure). Indications:     R78.81 Bacteremia.  History:         Patient has prior history of Echocardiogram examinations, most                  recent 12/12/2021. CAD; Risk Factors:Hypertension and                  Dyslipidemia.  Sonographer:     Carl Coma RDCS Referring Phys:  [id number] DAVID P FITZGERALD Diagnosing Phys: Evalene Lunger MD IMPRESSIONS  1. No valve vegetation noted  2. Left ventricular ejection fraction, by estimation, is 55 to 60%. The left ventricle has normal function. The left ventricle has no regional wall motion abnormalities. There is mild left ventricular hypertrophy. Left ventricular diastolic parameters are consistent with Grade I diastolic dysfunction (impaired relaxation).  3. Right ventricular systolic function is normal. The right ventricular size is normal. There is normal pulmonary artery systolic pressure. The estimated right ventricular systolic pressure is 33.5 mmHg.  4. The mitral valve is normal in structure. Mild mitral valve regurgitation. No evidence of mitral stenosis. Moderate mitral annular calcification.  5. The aortic valve is calcified. Aortic valve regurgitation is not visualized. Mild to moderate aortic valve stenosis. Aortic valve area, by VTI measures 1.54 cm. Aortic valve mean gradient measures 14.0 mmHg. Aortic valve Vmax measures 2.51 m/s.  6. There is moderate dilatation of the ascending aorta, measuring 46 mm.  7. The inferior vena cava is normal in size with greater than 50%  respiratory variability, suggesting right atrial pressure of 3 mmHg.  8. Frequent PVCs FINDINGS  Left Ventricle: Left ventricular ejection fraction, by estimation, is 55 to 60%. The left ventricle has normal function. The left ventricle has no regional wall motion abnormalities. Strain was performed and the global longitudinal strain is indeterminate. The left ventricular internal cavity size was normal in size. There is mild left ventricular hypertrophy. Left ventricular diastolic parameters are consistent with Grade I diastolic dysfunction (impaired relaxation). Right Ventricle: The right ventricular size is normal. No increase in right ventricular wall thickness. Right ventricular systolic function is normal. There is normal pulmonary artery systolic pressure. The tricuspid regurgitant velocity is 2.67 m/s, and  with an assumed right atrial pressure of 5 mmHg, the estimated right ventricular systolic pressure is 33.5 mmHg. Left Atrium: Left atrial size was normal in size. Right Atrium: Right atrial size was normal in size. Pericardium: There is no evidence of pericardial effusion. Mitral Valve: The mitral valve is normal in structure. There is mild calcification of the mitral valve leaflet(s). Moderate mitral annular calcification. Mild mitral valve regurgitation. No evidence of mitral valve stenosis. Tricuspid Valve: The tricuspid valve is normal in structure. Tricuspid valve regurgitation is mild . No evidence of tricuspid stenosis. The aortic valve is calcified. Aortic valve regurgitation is not visualized. Mild to moderate aortic stenosis is present. Pulmonic Valve: The pulmonic valve was normal in structure. Pulmonic valve regurgitation is not visualized. No evidence of pulmonic stenosis. Aorta: The aortic root is normal in size and structure. There is moderate dilatation of the ascending aorta, measuring 46 mm. Venous:  The inferior vena cava is normal in size with greater than 50% respiratory variability,  suggesting right atrial pressure of 3 mmHg. IAS/Shunts: No atrial level shunt detected by color flow Doppler. Additional Comments: 3D was performed not requiring image post processing on an independent workstation and was indeterminate.  LEFT VENTRICLE PLAX 2D LVIDd:         4.50 cm   Diastology LVIDs:         2.30 cm   LV e' medial:    4.90 cm/s LV PW:         0.80 cm   LV E/e' medial:  16.0 LV IVS:        0.90 cm   LV e' lateral:   5.87 cm/s LVOT diam:     2.30 cm   LV E/e' lateral: 13.3 LV SV:         66 LV SV Index:   39 LVOT Area:     4.15 cm  RIGHT VENTRICLE RV Basal diam:  3.30 cm RV S prime:     12.97 cm/s TAPSE (M-mode): 2.4 cm LEFT ATRIUM           Index        RIGHT ATRIUM          Index LA diam:      4.30 cm 2.55 cm/m   RA Area:     9.52 cm LA Vol (A2C): 36.3 ml 21.56 ml/m  RA Volume:   17.40 ml 10.33 ml/m LA Vol (A4C): 65.5 ml 38.90 ml/m  AORTIC VALVE AV Area (Vmax):    1.41 cm AV Area (Vmean):   1.54 cm AV Area (VTI):     1.54 cm AV Vmax:           250.75 cm/s AV Vmean:          174.250 cm/s AV VTI:            0.432 m AV Peak Grad:      25.2 mmHg AV Mean Grad:      14.0 mmHg LVOT Vmax:         85.23 cm/s LVOT Vmean:        64.500 cm/s LVOT VTI:          0.160 m LVOT/AV VTI ratio: 0.37  AORTA Ao Root diam: 4.00 cm Ao Asc diam:  4.60 cm MITRAL VALVE                TRICUSPID VALVE MV Area (PHT): 3.30 cm     TR Peak grad:   28.5 mmHg MV Decel Time: 230 msec     TR Vmax:        267.00 cm/s MV E velocity: 78.25 cm/s MV A velocity: 135.50 cm/s  SHUNTS MV E/A ratio:  0.58         Systemic VTI:  0.16 m                             Systemic Diam: 2.30 cm Evalene Lunger MD Electronically signed by Evalene Lunger MD Signature Date/Time: 06/02/2024/1:00:41 PM    Final    CT HEAD WO CONTRAST ( ) Result Date: 05/31/2024 EXAM: CT HEAD WITHOUT 05/31/2024 11:05:29 AM TECHNIQUE: CT of the head was performed without the administration of intravenous contrast. Automated exposure control, iterative  reconstruction, and/or weight based adjustment of the mA/kV was utilized to reduce the radiation dose to as low as reasonably achievable. COMPARISON: 12/06/2023  CLINICAL HISTORY: Mental status change, unknown cause FINDINGS: BRAIN AND VENTRICLES: No acute intracranial hemorrhage. No mass effect or midline shift. No extra-axial fluid collection. No evidence of acute infarct. No hydrocephalus. Proportional prominence of ventricles and sulci, consistent with diffuse cerebral parenchymal volume loss. Periventricular and subcortical white matter hypoattenuation, consistent with moderate chronic ischemic microvascular disease. Calcified atherosclerotic plaque within cavernous/supraclinoid internal carotid arteries. ORBITS: No acute abnormality. Bilateral lens replacements. SINUSES AND MASTOIDS: Mild scattered mucosal thickening in the ethmoid sinuses. Trace bilateral mastoid effusions. SOFT TISSUES AND SKULL: No acute skull fracture. No acute soft tissue abnormality. IMPRESSION: 1. No acute intracranial abnormality. 2. Diffuse cerebral parenchymal volume loss. 3. Moderate chronic ischemic microvascular disease. Electronically signed by: Donnice Mania MD 05/31/2024 10:53 PM EST RP Workstation: [id number]   CT CHEST WO CONTRAST Result Date: 05/30/2024 CLINICAL DATA:  Respiratory failure, pneumonia EXAM: CT CHEST WITHOUT CONTRAST TECHNIQUE: Multidetector CT imaging of the chest was performed following the standard protocol without IV contrast. RADIATION DOSE REDUCTION: This exam was performed according to the departmental dose-optimization program which includes automated exposure control, adjustment of the mA and/or kV according to patient size and/or use of iterative reconstruction technique. COMPARISON:  05/30/2024, 09/28/2023 FINDINGS: Cardiovascular: Unenhanced imaging of the heart is unremarkable without pericardial effusion. There is continued dense calcification of the mitral and aortic valves. 4.6 cm ascending  thoracic aortic aneurysm is noted, unchanged by my measurements since previous study. There is diffuse atherosclerosis of the aorta and coronary vasculature. Assessment of the vascular lumen cannot be performed without intravenous contrast. Mediastinum/Nodes: Borderline enlarged mediastinal and hilar lymph nodes are likely reactive. Thyroid , trachea, and esophagus are unremarkable. Lungs/Pleura: There is multifocal bilateral airspace disease, most pronounced within the right upper, right lower, and left lower lobes, consistent with bilateral bronchopneumonia. Trace right pleural effusion. No pneumothorax. Upper Abdomen: No acute abnormality. Musculoskeletal: No acute or destructive bony abnormalities. Multiple prior healed bilateral rib fractures. Chronic compression deformities at T5, T9, T10, and L1. Reconstructed images demonstrate no additional findings. IMPRESSION: 1. Multifocal bilateral bronchopneumonia as above. 2. Trace right parapneumonic effusion. 3. Reactive mediastinal and bilateral hilar lymph nodes. 4. 4.6 cm ascending thoracic aortic aneurysm. Recommend semi-annual imaging followup by CTA or MRA and referral to cardiothoracic surgery if not already obtained. This recommendation follows 2010 ACCF/AHA/AATS/ACR/ASA/SCA/SCAI/SIR/STS/SVM Guidelines for the Diagnosis and Management of Patients With Thoracic Aortic Disease. Circulation. 2010; 121: Z733-z630. Aortic aneurysm NOS (ICD10-I71.9) 5. Aortic Atherosclerosis (ICD10-I70.0). Coronary artery atherosclerosis. Electronically Signed   By: Ozell Daring M.D.   On: 05/30/2024 16:58   DG Chest Port 1 View Result Date: 05/30/2024 EXAM: 1 VIEW(S) XRAY OF THE CHEST 05/30/2024 11:04:00 AM COMPARISON: 12/06/2023 CLINICAL HISTORY: Questionable sepsis - evaluate for abnormality FINDINGS: LIMITATIONS/ARTIFACTS: Rightward rotation of the frontal projection reducing diagnostic sensitivity/specificity. Assessment complicated by the rightward rotation. LUNGS AND  PLEURA: New substantial opacity at the right lung base without obscuration of the right heart border, presumably this is a right lower lobe airspace opacity, although a diaphragmatic hernia could appear similarly. This could be further assessed with chest CT if clinically warranted. No pleural effusion. No pneumothorax. HEART AND MEDIASTINUM: Aortic atherosclerosis. No acute abnormality of the cardiac and mediastinal silhouettes. BONES AND SOFT TISSUES: No acute osseous abnormality. IMPRESSION: 1. Right lung base opacity, favored right lower lobe airspace process over new diaphragmatic hernia; evaluation limited by rightward rotation, and chest CT may be considered if warranted. 2. Aortic atherosclerosis. Electronically signed by: Ryan Salvage MD 05/30/2024 12:03 PM EST RP Workstation: [id number]  Mairead Schwarzkopf, DO [facility] 06/02/2024, 6:50 PM    Dictation software may have been used to generate the above note. Typos may occur and escape review in typed/dictated notes. Please contact Dr Marsa directly for clarity if needed.  Staff may message me via secure chat in Epic  but this may not receive an immediate response,  please page me for urgent matters!  If 7PM-7AM, please contact night coverage [email or web address]

## 2024-06-02 NOTE — Progress Notes (Signed)
 INFECTIOUS DISEASE PROGRESS NOTE Date of Admission:  05/30/2024     ID: Nicholas Gross is a 89 y.o. male with  MRSA pna and bacteremia Principal Problem:   Severe sepsis (HCC) Active Problems:   Acute metabolic encephalopathy   Hypernatremia   Bullous pemphigoid (HCC)   Acute respiratory failure with hypoxia (HCC)   AKI (acute kidney injury)   Multifocal pneumonia   High anion gap metabolic acidosis   Elevated liver function tests   Itching   Iron  deficiency anemia   Thrombocytopenia   Protein-calorie malnutrition, severe   Subjective: Out of unit. No fevers  ROS  Eleven systems are reviewed and negative except per hpi  Medications:  Antibiotics Given (last 72 hours)     Date/Time Action Medication Dose Rate   05/30/24 2201 New Bag/Given   linezolid  (ZYVOX ) IVPB 600 mg 600 mg 300 mL/hr   05/31/24 0951 New Bag/Given   linezolid  (ZYVOX ) IVPB 600 mg 600 mg 300 mL/hr   05/31/24 1134 New Bag/Given   cefTRIAXone  (ROCEPHIN ) 2 g in sodium chloride  0.9 % 100 mL IVPB 2 g 200 mL/hr   05/31/24 1319 New Bag/Given   azithromycin  (ZITHROMAX ) 500 mg in sodium chloride  0.9 % 250 mL IVPB 500 mg 250 mL/hr   05/31/24 2108 New Bag/Given   linezolid  (ZYVOX ) IVPB 600 mg 600 mg 300 mL/hr   06/01/24 9081 New Bag/Given   linezolid  (ZYVOX ) IVPB 600 mg 600 mg 300 mL/hr   06/01/24 1218 New Bag/Given   cefTRIAXone  (ROCEPHIN ) 2 g in sodium chloride  0.9 % 100 mL IVPB 2 g 200 mL/hr   06/01/24 1450 New Bag/Given   azithromycin  (ZITHROMAX ) 500 mg in sodium chloride  0.9 % 250 mL IVPB 500 mg 250 mL/hr   06/01/24 1759 New Bag/Given   DAPTOmycin  (CUBICIN ) IVPB 500 mg/61mL premix 500 mg 100 mL/hr   06/01/24 2119 New Bag/Given   linezolid  (ZYVOX ) IVPB 600 mg 600 mg 300 mL/hr   06/02/24 9048 New Bag/Given   linezolid  (ZYVOX ) IVPB 600 mg 600 mg 300 mL/hr   06/02/24 1214 New Bag/Given   cefTRIAXone  (ROCEPHIN ) 2 g in sodium chloride  0.9 % 100 mL IVPB 2 g 200 mL/hr       Chlorhexidine  Gluconate Cloth   6 each Topical Daily   clobetasol  ointment  1 Application Topical BID   diphenhydrAMINE -zinc  acetate   Topical BID   heparin   5,000 Units Subcutaneous Q8H   hydrocortisone  sod succinate (SOLU-CORTEF ) inj  50 mg Intravenous Q6H   insulin  aspart  0-6 Units Subcutaneous Q4H   mupirocin  ointment  1 Application Nasal BID    Objective: Vital signs in last 24 hours: Temp:  [97.1 F (36.2 C)-98.1 F (36.7 C)] 98.1 F (36.7 C) (12/11 1220) Pulse Rate:  [58-138] 63 (12/11 1430) Resp:  [11-26] 20 (12/11 1427) BP: (103-174)/(55-94) 174/82 (12/11 1427) SpO2:  [80 %-100 %] 100 % (12/11 1430) Weight:  [58.9 kg] 58.9 kg (12/11 0500) {physical [id number]}  Lab Results Recent Labs    06/01/24 0411 06/01/24 0625 06/02/24 0356  WBC  --  8.4 7.7  HGB  --  8.5* 8.0*  HCT  --  27.6* 25.6*  NA 153*  --  157*  K 3.8  --  3.2*  CL 125*  --  126*  CO2 15*  --  16*  BUN 64*  --  69*  CREATININE 2.53*  --  2.56*    Microbiology: @micro @ Studies/Results: ECHOCARDIOGRAM COMPLETE Result Date: 06/02/2024    ECHOCARDIOGRAM REPORT  Patient Name:   Nicholas Gross Date of Exam: 06/01/2024 Medical Rec #:  [id number]         Height:       68.0 in Accession #:    [id number]        Weight:       126.8 lb Date of Birth:  [birth year]         BSA:          1.684 m Patient Age:    89 years          BP:           112/71 mmHg Patient Gender: M                 HR:           91 bpm. Exam Location:  [facility] Procedure: 2D Echo, Cardiac Doppler and Color Doppler (Both Spectral and Color            Flow Doppler were utilized during procedure). Indications:     R78.81 Bacteremia.  History:         Patient has prior history of Echocardiogram examinations, most                  recent 12/12/2021. CAD; Risk Factors:Hypertension and                  Dyslipidemia.  Sonographer:     Carl Coma RDCS Referring Phys:  [id number] Kemara Quigley P Elma Shands Diagnosing Phys: Evalene Lunger MD IMPRESSIONS  1. No valve vegetation noted  2.  Left ventricular ejection fraction, by estimation, is 55 to 60%. The left ventricle has normal function. The left ventricle has no regional wall motion abnormalities. There is mild left ventricular hypertrophy. Left ventricular diastolic parameters are consistent with Grade I diastolic dysfunction (impaired relaxation).  3. Right ventricular systolic function is normal. The right ventricular size is normal. There is normal pulmonary artery systolic pressure. The estimated right ventricular systolic pressure is 33.5 mmHg.  4. The mitral valve is normal in structure. Mild mitral valve regurgitation. No evidence of mitral stenosis. Moderate mitral annular calcification.  5. The aortic valve is calcified. Aortic valve regurgitation is not visualized. Mild to moderate aortic valve stenosis. Aortic valve area, by VTI measures 1.54 cm. Aortic valve mean gradient measures 14.0 mmHg. Aortic valve Vmax measures 2.51 m/s.  6. There is moderate dilatation of the ascending aorta, measuring 46 mm.  7. The inferior vena cava is normal in size with greater than 50% respiratory variability, suggesting right atrial pressure of 3 mmHg.  8. Frequent PVCs FINDINGS  Left Ventricle: Left ventricular ejection fraction, by estimation, is 55 to 60%. The left ventricle has normal function. The left ventricle has no regional wall motion abnormalities. Strain was performed and the global longitudinal strain is indeterminate. The left ventricular internal cavity size was normal in size. There is mild left ventricular hypertrophy. Left ventricular diastolic parameters are consistent with Grade I diastolic dysfunction (impaired relaxation). Right Ventricle: The right ventricular size is normal. No increase in right ventricular wall thickness. Right ventricular systolic function is normal. There is normal pulmonary artery systolic pressure. The tricuspid regurgitant velocity is 2.67 m/s, and  with an assumed right atrial pressure of 5 mmHg, the  estimated right ventricular systolic pressure is 33.5 mmHg. Left Atrium: Left atrial size was normal in size. Right Atrium: Right atrial size was normal in size. Pericardium: There is no evidence of pericardial effusion. Mitral Valve: The  mitral valve is normal in structure. There is mild calcification of the mitral valve leaflet(s). Moderate mitral annular calcification. Mild mitral valve regurgitation. No evidence of mitral valve stenosis. Tricuspid Valve: The tricuspid valve is normal in structure. Tricuspid valve regurgitation is mild . No evidence of tricuspid stenosis. The aortic valve is calcified. Aortic valve regurgitation is not visualized. Mild to moderate aortic stenosis is present. Pulmonic Valve: The pulmonic valve was normal in structure. Pulmonic valve regurgitation is not visualized. No evidence of pulmonic stenosis. Aorta: The aortic root is normal in size and structure. There is moderate dilatation of the ascending aorta, measuring 46 mm. Venous: The inferior vena cava is normal in size with greater than 50% respiratory variability, suggesting right atrial pressure of 3 mmHg. IAS/Shunts: No atrial level shunt detected by color flow Doppler. Additional Comments: 3D was performed not requiring image post processing on an independent workstation and was indeterminate.  LEFT VENTRICLE PLAX 2D LVIDd:         4.50 cm   Diastology LVIDs:         2.30 cm   LV e' medial:    4.90 cm/s LV PW:         0.80 cm   LV E/e' medial:  16.0 LV IVS:        0.90 cm   LV e' lateral:   5.87 cm/s LVOT diam:     2.30 cm   LV E/e' lateral: 13.3 LV SV:         66 LV SV Index:   39 LVOT Area:     4.15 cm  RIGHT VENTRICLE RV Basal diam:  3.30 cm RV S prime:     12.97 cm/s TAPSE (M-mode): 2.4 cm LEFT ATRIUM           Index        RIGHT ATRIUM          Index LA diam:      4.30 cm 2.55 cm/m   RA Area:     9.52 cm LA Vol (A2C): 36.3 ml 21.56 ml/m  RA Volume:   17.40 ml 10.33 ml/m LA Vol (A4C): 65.5 ml 38.90 ml/m  AORTIC  VALVE AV Area (Vmax):    1.41 cm AV Area (Vmean):   1.54 cm AV Area (VTI):     1.54 cm AV Vmax:           250.75 cm/s AV Vmean:          174.250 cm/s AV VTI:            0.432 m AV Peak Grad:      25.2 mmHg AV Mean Grad:      14.0 mmHg LVOT Vmax:         85.23 cm/s LVOT Vmean:        64.500 cm/s LVOT VTI:          0.160 m LVOT/AV VTI ratio: 0.37  AORTA Ao Root diam: 4.00 cm Ao Asc diam:  4.60 cm MITRAL VALVE                TRICUSPID VALVE MV Area (PHT): 3.30 cm     TR Peak grad:   28.5 mmHg MV Decel Time: 230 msec     TR Vmax:        267.00 cm/s MV E velocity: 78.25 cm/s MV A velocity: 135.50 cm/s  SHUNTS MV E/A ratio:  0.58         Systemic VTI:  0.16  m                             Systemic Diam: 2.30 cm Evalene Lunger MD Electronically signed by Evalene Lunger MD Signature Date/Time: 06/02/2024/1:00:41 PM    Final     Assessment/Plan: DEAARON FULGHUM is a 89 y.o. male admitted with cough shortness of breath found to have metapneumovirus and rhinovirus infection as well as likely MRSA bronchopneumonia and bacteremia. Source of the bacteremia is most likely the pneumonia.  12/11- out of unit. No fevers,.  FU BCX 12/10 ngtd  Echo No veg, mild MR with mod calcifications, calcified AV valve, mild mod AS Recommendations  Will continue linezolid  for MRSA pneumonia as well as a ceftriaxone  and azithromycin . Will give a dose of daptomycin  which at his renal function will get every 48 hours for the bacteremia. Thank you very much for the consult. Will follow with you.  Alm SHAUNNA Needle   06/02/2024, 2:46 PM

## 2024-06-02 NOTE — Progress Notes (Signed)
 Palliative Care Progress Note, Assessment & Plan   Patient Name: Nicholas Gross       Date: 06/02/2024 DOB: [birth year]  Age: 89 y.o. MRN#: [id number] Attending Physician: Marsa Edelman, DO Primary Care Physician: Vicci [name], DO Admit Date: 05/30/2024  Subjective: Patient is lying in bed, in no apparent distress.  He has a blank stare but is able to acknowledge my presence with nodding and vocalizing yes/no.  Dr. Marsa, patient's niece Nicholas Gross, Nicholas Gross's husband Nicholas Gross are at bedside during my visit.  HPI: 89 y.o. male  with past medical history of  CAD, HTN, CVA (right side residual), dyslipidemia, former smoker, mesenteric mass, admitted on 05/30/2024 from [facility] with increased work of breathing and shortness of breath.   Family endorses patient was having symptoms of shortness of breath requiring increased oxygen approximately 1 week prior to patient.  However, a few days leading to admission breathing worsened.   On presentation to the ED, patient was noted to have increased work of breathing requiring oxygen support via nasal cannula.  Blood work revealed AKI with creatinine of 2.7 and hemoglobin of 6 7.  Patient's liver functions test were elevated-ALT 344 and AST-167.  Chest x-ray revealed right lower lobe pneumonia.  COVID, influenza and RSV PCR were negative.  Blood cultures were drawn and patient was given IV antibiotics (ceftriaxone  and azithromycin ) and IVF.   Patient is currently being treated for multifocal pneumonia, acute respiratory failure, AKI, hypotension, tachycardia, hyponatremia (sodium of 149), and acute metabolic encephalopathy.   SLP was consulted and patient unable to pass swallow eval.  N.p.o. with monitored ice chips is current recommendation.    ENT was consulted to the blood patient and family with goals of care discussions.   Of note, patient is familiar to me as I cared for patient during his May 2025 hospitalization.  Summary of counseling/coordination of care: Extensive chart review completed prior to meeting patient including labs, vital signs, imaging, progress notes, orders, and available advanced directive documents from current and previous encounters.   After reviewing the patient's chart and assessing the patient at bedside, I spoke with patient and family at bedside in regards to symptom management and goals of care.   Course of hospitalization and patient's current medical status discussed in detail.  Reviewed patient's frailty and deconditioning prior to this admission.  Discussed use of antibiotics to address pneumonia as well as ID recommendations of TEE and cardiac ultrasound.  Discussed results of these test if showing any growth would require further surgery/procedures.  Family shares patient would not want to do any further procedures or anything that might cause him harm.  Alternatively, discussed use of IV antibiotics for extended amount of time via PICC line.  Discussed PICC line is not a painful or invasive procedure but rather a safer way to administer IV medications.   Discussed bicycles important to patient and family.  Nicholas Gross continues to weigh the pros and cons of continuing with current plan of care or shifting to comfort focused care.  She shares concern that she does not want to do anything to harm him but also does not want to give up on him.  Human mortality,  limitations of the human body, quality versus quantity of life discussed in detail.  Therapeutic silence, active listening, and emotional support provided.  Plan remains to continue watchful waiting and treating the treatable.  During our assessment, patient was more interactive than he has been earlier today as well as yesterday.  He is able to  say yes/no and shake his head appropriately when asked simple questions.  Discussed with SLP that patient is more alert.  They plan to follow-up with patient tomorrow afternoon to continue assessing his swallow ability.  Recommended continuing ice chips when awake and alert by nursing staff.  No change to plan of care at this time.  PMT will continue to follow and support.  Physical Exam Vitals reviewed.  Constitutional:      General: He is not in acute distress.    Appearance: He is normal weight.  HENT:     Mouth/Throat:     Mouth: Mucous membranes are moist.  Eyes:     Pupils: Pupils are equal, round, and reactive to light.  Pulmonary:     Effort: Pulmonary effort is normal.  Abdominal:     Palpations: Abdomen is soft.  Musculoskeletal:     Comments: Generalized weakness  Skin:    General: Skin is warm and dry.     Coloration: Skin is pale.  Neurological:     Mental Status: He is alert.  Psychiatric:        Behavior: Behavior normal.             35 minute visit includes: Detailed review of medical records (labs, imaging, vital signs), medically appropriate exam (mental status, respiratory, cardiac, skin), discussed with treatment team, counseling and educating patient, family and staff, documenting clinical information, medication management and coordination of care.  Lamarr L. Arvid, DNP, FNP-BC Palliative Medicine Team

## 2024-06-02 NOTE — Code Documentation (Signed)
 Stroke Response Nurse Documentation Code Documentation  Nicholas Gross is a 89 y.o. male admitted to [facility] on 12/8 for respiratory, sepsis with past medical hx of CAD, HTN, HLD, previous stroke with residual right deficits. Code stroke was activated by [facility] staff.   Stroke response met patient, primary RN, and neurologist Dr. Germaine at the bedside after patient activation. Code stroke cancelled by neurologist at time of arrival to bedside. Report obtained by provider and staff at bedside. Patient on 2C unit where he was recently transferred to from ICU.  LKW is unclear, likely several days. Staff went to obtain vital signs on patient and noticed their eyes were glossed over. Noted to have pinpoint pupils and worsening mental status at that time. Code stroke called.   Care/Plan: Code stroke cancelled at 1400. Non-code stroke CT head order placed. No additional stroke imaging or assessments, per MD.    Burnard KANDICE Bras  Stroke Response RN

## 2024-06-02 NOTE — Progress Notes (Signed)
 PHARMACY - PHYSICIAN COMMUNICATION CRITICAL VALUE ALERT - BLOOD CULTURE IDENTIFICATION (BCID)  LATE ENTRY  LEGEND Nicholas Gross is an 89 y.o. male who presented to [facility] on 05/30/2024 with a chief complaint of AMS  Assessment:  blood cultures from 12/8 with GPC in 1 of 4 bottles, BCID detects MRSA  Name of physician (or Provider) Contacted: Drs Marsa and Epifanio  Current antibiotics: linezolid , ceftriaxone , azithromycin    Changes to prescribed antibiotics recommended:  Auto - ID consult.  Since concern for pneumonia - added daptomycin .    Results for orders placed or performed during the hospital encounter of 05/30/24  Blood Culture ID Panel (Reflexed) (Collected: 05/30/2024 10:54 AM)  Result Value Ref Range   Enterococcus faecalis NOT DETECTED NOT DETECTED   Enterococcus Faecium NOT DETECTED NOT DETECTED   Listeria monocytogenes NOT DETECTED NOT DETECTED   Staphylococcus species DETECTED (A) NOT DETECTED   Staphylococcus aureus (BCID) DETECTED (A) NOT DETECTED   Staphylococcus epidermidis NOT DETECTED NOT DETECTED   Staphylococcus lugdunensis NOT DETECTED NOT DETECTED   Streptococcus species NOT DETECTED NOT DETECTED   Streptococcus agalactiae NOT DETECTED NOT DETECTED   Streptococcus pneumoniae NOT DETECTED NOT DETECTED   Streptococcus pyogenes NOT DETECTED NOT DETECTED   A.calcoaceticus-baumannii NOT DETECTED NOT DETECTED   Bacteroides fragilis NOT DETECTED NOT DETECTED   Enterobacterales NOT DETECTED NOT DETECTED   Enterobacter cloacae complex NOT DETECTED NOT DETECTED   Escherichia coli NOT DETECTED NOT DETECTED   Klebsiella aerogenes NOT DETECTED NOT DETECTED   Klebsiella oxytoca NOT DETECTED NOT DETECTED   Klebsiella pneumoniae NOT DETECTED NOT DETECTED   Proteus species NOT DETECTED NOT DETECTED   Salmonella species NOT DETECTED NOT DETECTED   Serratia marcescens NOT DETECTED NOT DETECTED   Haemophilus influenzae NOT DETECTED NOT DETECTED   Neisseria  meningitidis NOT DETECTED NOT DETECTED   Pseudomonas aeruginosa NOT DETECTED NOT DETECTED   Stenotrophomonas maltophilia NOT DETECTED NOT DETECTED   Candida albicans NOT DETECTED NOT DETECTED   Candida auris NOT DETECTED NOT DETECTED   Candida glabrata NOT DETECTED NOT DETECTED   Candida krusei NOT DETECTED NOT DETECTED   Candida parapsilosis NOT DETECTED NOT DETECTED   Candida tropicalis NOT DETECTED NOT DETECTED   Cryptococcus neoformans/gattii NOT DETECTED NOT DETECTED   Meth resistant mecA/C and MREJ DETECTED (A) NOT DETECTED    Nicholas Gross, PharmD, BCPS, BCIDP Work Cell: [phone] 06/02/2024 8:18 AM

## 2024-06-02 NOTE — Progress Notes (Signed)
°  Chaplain On-Call responded to Code Stroke notification at 1400 hours.  The patient was being stabilized by the Medical Team.  Chaplain assured Staff of availability as needed.  Chaplain Bebe Ardean EMERSON Hershal., [facility]

## 2024-06-03 ENCOUNTER — Other Ambulatory Visit: Payer: Self-pay

## 2024-06-03 ENCOUNTER — Encounter: Payer: Self-pay | Admitting: Student in an Organized Health Care Education/Training Program

## 2024-06-03 ENCOUNTER — Inpatient Hospital Stay

## 2024-06-03 DIAGNOSIS — N189 Chronic kidney disease, unspecified: Secondary | ICD-10-CM | POA: Diagnosis not present

## 2024-06-03 DIAGNOSIS — B9781 Human metapneumovirus as the cause of diseases classified elsewhere: Secondary | ICD-10-CM | POA: Diagnosis not present

## 2024-06-03 DIAGNOSIS — E871 Hypo-osmolality and hyponatremia: Secondary | ICD-10-CM

## 2024-06-03 DIAGNOSIS — R569 Unspecified convulsions: Secondary | ICD-10-CM

## 2024-06-03 DIAGNOSIS — J9690 Respiratory failure, unspecified, unspecified whether with hypoxia or hypercapnia: Secondary | ICD-10-CM

## 2024-06-03 DIAGNOSIS — I959 Hypotension, unspecified: Secondary | ICD-10-CM

## 2024-06-03 DIAGNOSIS — R4182 Altered mental status, unspecified: Secondary | ICD-10-CM

## 2024-06-03 DIAGNOSIS — R7881 Bacteremia: Secondary | ICD-10-CM | POA: Diagnosis not present

## 2024-06-03 DIAGNOSIS — R000 Tachycardia, unspecified: Secondary | ICD-10-CM

## 2024-06-03 DIAGNOSIS — J15212 Pneumonia due to Methicillin resistant Staphylococcus aureus: Secondary | ICD-10-CM | POA: Diagnosis not present

## 2024-06-03 LAB — GLUCOSE, CAPILLARY
Glucose-Capillary: 123 mg/dL — ABNORMAL HIGH (ref 70–99)
Glucose-Capillary: 110 mg/dL — ABNORMAL HIGH (ref 70–99)
Glucose-Capillary: 135 mg/dL — ABNORMAL HIGH (ref 70–99)
Glucose-Capillary: 113 mg/dL — ABNORMAL HIGH (ref 70–99)

## 2024-06-03 LAB — BASIC METABOLIC PANEL WITH GFR
Anion gap: 12 (ref 5–15)
BUN: 63 mg/dL — ABNORMAL HIGH (ref 8–23)
CO2: 18 mmol/L — ABNORMAL LOW (ref 22–32)
Calcium: 8.8 mg/dL — ABNORMAL LOW (ref 8.9–10.3)
Chloride: 129 mmol/L — ABNORMAL HIGH (ref 98–111)
Creatinine, Ser: 2.34 mg/dL — ABNORMAL HIGH (ref 0.61–1.24)
GFR, Estimated: 25 mL/min — ABNORMAL LOW (ref >60)
Glucose, Bld: 132 mg/dL — ABNORMAL HIGH (ref 70–99)
Potassium: 3.7 mmol/L (ref 3.5–5.1)
Sodium: 159 mmol/L — ABNORMAL HIGH (ref 135–145)

## 2024-06-03 LAB — COMPREHENSIVE METABOLIC PANEL WITH GFR
ALT: 161 U/L — ABNORMAL HIGH (ref 0–44)
AST: 30 U/L (ref 15–41)
Albumin: 2.8 g/dL — ABNORMAL LOW (ref 3.5–5.0)
Alkaline Phosphatase: 109 U/L (ref 38–126)
Anion gap: 12 (ref 5–15)
BUN: 67 mg/dL — ABNORMAL HIGH (ref 8–23)
CO2: 19 mmol/L — ABNORMAL LOW (ref 22–32)
Calcium: 9.1 mg/dL (ref 8.9–10.3)
Chloride: 129 mmol/L — ABNORMAL HIGH (ref 98–111)
Creatinine, Ser: 2.52 mg/dL — ABNORMAL HIGH (ref 0.61–1.24)
GFR, Estimated: 23 mL/min — ABNORMAL LOW (ref >60)
Glucose, Bld: 141 mg/dL — ABNORMAL HIGH (ref 70–99)
Potassium: 3.4 mmol/L — ABNORMAL LOW (ref 3.5–5.1)
Sodium: 161 mmol/L (ref 135–145)
Total Bilirubin: 0.3 mg/dL (ref 0.0–1.2)
Total Protein: 5.4 g/dL — ABNORMAL LOW (ref 6.5–8.1)

## 2024-06-03 LAB — CBC
HCT: 28.5 % — ABNORMAL LOW (ref 39.0–52.0)
Hemoglobin: 9.1 g/dL — ABNORMAL LOW (ref 13.0–17.0)
MCH: 33.6 pg (ref 26.0–34.0)
MCHC: 31.9 g/dL (ref 30.0–36.0)
MCV: 105.2 fL — ABNORMAL HIGH (ref 80.0–100.0)
Platelets: 136 K/uL — ABNORMAL LOW (ref 150–400)
RBC: 2.71 MIL/uL — ABNORMAL LOW (ref 4.22–5.81)
RDW: 15.2 % (ref 11.5–15.5)
WBC: 13.3 K/uL — ABNORMAL HIGH (ref 4.0–10.5)
nRBC: 0 % (ref 0.0–0.2)

## 2024-06-03 LAB — LACTIC ACID, PLASMA: Lactic Acid, Venous: 2.6 mmol/L (ref 0.5–1.9)

## 2024-06-03 MED ORDER — DEXTROSE 5 % IV SOLN: INTRAVENOUS | Status: DC

## 2024-06-03 MED ORDER — HALOPERIDOL LACTATE 2 MG/ML PO CONC: 0.5000 mg | ORAL | Status: DC | PRN

## 2024-06-03 MED ORDER — GLYCOPYRROLATE 1 MG PO TABS: 1.0000 mg | ORAL_TABLET | ORAL | Status: DC | PRN

## 2024-06-03 MED ORDER — POTASSIUM CHLORIDE 10 MEQ/100ML IV SOLN
10.0000 meq | INTRAVENOUS | Status: DC
  Administered 2024-06-03 (×3): 10 meq via INTRAVENOUS
  Filled 2024-06-03 (×3): qty 100

## 2024-06-03 MED ORDER — LORAZEPAM 1 MG PO TABS: 1.0000 mg | ORAL_TABLET | ORAL | Status: DC | PRN

## 2024-06-03 MED ORDER — MORPHINE SULFATE (PF) 2 MG/ML IV SOLN
1.0000 mg | INTRAVENOUS | Status: DC | PRN
  Administered 2024-06-03 – 2024-06-04 (×6): 2 mg via INTRAVENOUS
  Filled 2024-06-03 (×3): qty 1
  Filled 2024-06-03: qty 2
  Filled 2024-06-03 (×3): qty 1

## 2024-06-03 MED ORDER — HALOPERIDOL LACTATE 5 MG/ML IJ SOLN
0.5000 mg | INTRAMUSCULAR | Status: DC | PRN
  Administered 2024-06-04 – 2024-06-06 (×2): 0.5 mg via INTRAVENOUS
  Filled 2024-06-03 (×2): qty 1

## 2024-06-03 MED ORDER — GLYCOPYRROLATE 0.2 MG/ML IJ SOLN: 0.2000 mg | INTRAMUSCULAR | Status: DC | PRN

## 2024-06-03 MED ORDER — LORAZEPAM 2 MG/ML PO CONC: 1.0000 mg | ORAL | Status: DC | PRN

## 2024-06-03 MED ORDER — POTASSIUM CHLORIDE 20 MEQ PO PACK: 40.0000 meq | PACK | Freq: Once | ORAL | Status: DC

## 2024-06-03 MED ORDER — DIPHENHYDRAMINE HCL 50 MG/ML IJ SOLN: 12.5000 mg | INTRAMUSCULAR | Status: DC | PRN

## 2024-06-03 MED ORDER — LOPERAMIDE HCL 2 MG PO CAPS: 2.0000 mg | ORAL_CAPSULE | ORAL | Status: DC | PRN

## 2024-06-03 MED ORDER — GLYCOPYRROLATE 0.2 MG/ML IJ SOLN
0.2000 mg | INTRAMUSCULAR | Status: DC | PRN
  Administered 2024-06-04 – 2024-06-06 (×6): 0.2 mg via INTRAVENOUS
  Filled 2024-06-03 (×7): qty 1

## 2024-06-03 MED ORDER — SODIUM CHLORIDE 0.45 % IV SOLN: INTRAVENOUS | Status: DC

## 2024-06-03 MED ORDER — CAMPHOR-MENTHOL 0.5-0.5 % EX LOTN
TOPICAL_LOTION | CUTANEOUS | Status: DC | PRN
  Filled 2024-06-03: qty 222

## 2024-06-03 MED ORDER — LORAZEPAM 2 MG/ML IJ SOLN
1.0000 mg | INTRAMUSCULAR | Status: DC | PRN
  Administered 2024-06-04 – 2024-06-05 (×2): 1 mg via INTRAVENOUS
  Filled 2024-06-03 (×2): qty 1

## 2024-06-03 MED ORDER — HALOPERIDOL 0.5 MG PO TABS: 0.5000 mg | ORAL_TABLET | ORAL | Status: DC | PRN

## 2024-06-03 NOTE — Progress Notes (Signed)
 Brief progress note - full note to follow  Discussion this afternoon w/ [name], pt's niece / HCPOA. Decision at this time for comfort measures was confirmed, all questions answered. Orders are in place. TOC consult to reach out to hospice for evaluation for hospice inpatient

## 2024-06-03 NOTE — Progress Notes (Addendum)
 Palliative Care Progress Note, Assessment & Plan   Patient Name: Nicholas Gross       Date: 06/03/2024 DOB: Apr 17, 1928  Age: 89 y.o. MRN#: [id number] Attending Physician: Alexander, Natalie, DO Primary Care Physician: Vicci [name], DO Admit Date: 05/30/2024  Subjective: Patient is lying in bed with his eyes open.  He acknowledges my presence with a nod.  He makes no vocalizations during my visit.  His sister is at bedside during my visit.  HPI: 89 y.o. male  with past medical history of  CAD, HTN, CVA (right side residual), dyslipidemia, former smoker, mesenteric mass, admitted on 05/30/2024 from [facility] with increased work of breathing and shortness of breath.   Family endorses patient was having symptoms of shortness of breath requiring increased oxygen approximately 1 week prior to patient.  However, a few days leading to admission breathing worsened.   On presentation to the ED, patient was noted to have increased work of breathing requiring oxygen support via nasal cannula.  Blood work revealed AKI with creatinine of 2.7 and hemoglobin of 6 7.  Patient's liver functions test were elevated-ALT 344 and AST-167.  Chest x-ray revealed right lower lobe pneumonia.  COVID, influenza and RSV PCR were negative.  Blood cultures were drawn and patient was given IV antibiotics (ceftriaxone  and azithromycin ) and IVF.   Patient is currently being treated for multifocal pneumonia, acute respiratory failure, AKI, hypotension, tachycardia, hyponatremia (sodium of 149), and acute metabolic encephalopathy.   SLP was consulted and patient unable to pass swallow eval.  N.p.o. with monitored ice chips is current recommendation.   ENT was consulted to the blood patient and family with goals of  care discussions.   Of note, patient is familiar to me as I cared for patient during his May 2025 hospitalization.  Summary of counseling/coordination of care: Extensive chart review completed prior to meeting patient including labs, vital signs, imaging, progress notes, orders, and available advanced directive documents from current and previous encounters.   After reviewing the patient's chart and assessing the patient at bedside, I spoke with patient in regards to symptom management and goals of care.   The patient is unable to make any vocalizations, he can appropriately answer questions by nodding his head yes/no.  Symptom assessment completed.  He endorses he is itchy on his arms and chest.  Reviewed MAR.  Added Sarna as needed in addition to 2 ointments already scheduled.  Discussed with patient in hopes that he can relieve some of his itching symptoms.  I discussed next steps with patient and his sister at bedside.  We reviewed we will continue to hope for improvements.  Patient appears to have more cognitive faculties and is able to engage more appropriately in discussions today-mild improvement from yesterday.  I discussed plan of care with patient.  I highlighted that we will continue to treat his infections with antibiotics, gentle hydration, and watching for cognitive improvement.  He nodded his head yes in agreement.  He was also able to confirm to me that he had no questions or concerns at this time.  After visiting with the patient, I attempted to speak with patient's niece Tammy over the phone.  No answer.  HIPAA compliant voicemail left with PMT contact info given.  As per my discussion with Tammy yesterday, plan remains to continue watchful waiting.  Tammy was considering placing a PICC line for long-term antibiotic use.  PMT remains available and will continue discussions with family.   Addendum: 1445-received message on palliative medicine team phone that patient's niece Tammy  states she would like to shift to full comfort measures.  She stated to palliative medicine team that no return phone call or follow-up is needed for today.    I counseled with attending that family has made decision to shift to full comfort measures.  Attending aware and plans to adjust orders appropriately as she has spoken with niece Tammy earlier today.  I am off service until Monday but will ask a colleague to follow-up with patient and family over the weekend.  Physical Exam Vitals reviewed.  Constitutional:      Appearance: He is normal weight.  HENT:     Head: Normocephalic.     Mouth/Throat:     Mouth: Mucous membranes are moist.  Pulmonary:     Effort: Pulmonary effort is normal.  Abdominal:     Palpations: Abdomen is soft.  Musculoskeletal:     Comments: Generalized weakness  Skin:    General: Skin is warm and dry.  Neurological:     Mental Status: He is alert.  Psychiatric:        Mood and Affect: Mood normal.        Behavior: Behavior normal.             35 minute visit includes: Detailed review of medical records (labs, imaging, vital signs), medically appropriate exam (mental status, respiratory, cardiac, skin), discussed with treatment team, counseling and educating patient, family and staff, documenting clinical information, medication management and coordination of care.  Lamarr L. Arvid, DNP, FNP-BC Palliative Medicine Team

## 2024-06-03 NOTE — Progress Notes (Signed)
 SLP Cancellation Note  Patient Details Name: Nicholas Gross MRN: [id number] DOB: 02/18/1928   Cancelled treatment:       Reason Eval/Treat Not Completed: Patient at procedure or test/unavailable  Pt out of room at EEG. 10 minutes of non-billable time spent providing education to pt's niece who was in pt's room. At this time, decision made to discharge ST orders and Dr Marsa in the room discussing further options for pt care with his niece.   All questions answered to satisfaction.  Lucienne Sawyers B. Rubbie, M.S., CCC-SLP, CBIS Speech-Language Pathologist Certified Brain Injury Specialist [facility] [phone] Ascom [phone] Fax [phone]   Musa Rewerts Rubbie 06/03/2024, 1:30 PM

## 2024-06-03 NOTE — Progress Notes (Signed)
 PROGRESS NOTE    Nicholas Gross   FMW:[id number] DOB: 1927/10/31  DOA: 05/30/2024 Date of Service: 06/03/2024 which is hospital day 4  PCP: Vicci [name], [facility] course / significant events:   89 year old male presenting to the hospital from his nursing facility with AMS, SOB.   HPI: Family reports that the patient had symptoms of shortness of breath and oxygen requirements around a week ago that improved a few days ago only to worsen over the past couple of days.  They report that he was noted to have increased work of breathing and increased oxygen requirements at his nursing facility prompting transfer to the emergency department.  Family reported the patient has not had similar symptoms recently and he is not oxygen dependent.  They report that he is mostly bedbound and sometimes is in a wheelchair.  He requires help with almost all of his activities of daily living.  He is able to hold a conversation but is not as verbal as he was in the past.   12/08: On presentation to the ED, acute hypoxic resp faiol, AKI Cr 2.78 and a BUN of 67. Elevated LFT, hypernatremia (149), lactic acidosis of 4.4.  CXR RLL PNA. COVID, influenza, and RSV PCR was negative.  Blood cultures were drawn. Start IV antibiotics with ceftriaxone  and azithromycin . IV fluids. Patient is DNR/DNI based on his wishes and a signed MOLST form. ICU team was consulted for consideration of admission to the ICU in case needing pressors. 12/09: Hospitalist team assumes care. Patient able to follow some simple commands like opening his mouth and wiggling his toes.  Mental status still not back to baseline.  Unable to pass swallow eval.  Will get palliative care consultation.  Viral respiratory panel positive for metapneumovirus and rhinovirus.   Continue empiric antibiotics for pneumonia. Patient with elevated creatinine 2.3 and sodium 150 12/10: Cr 2.53, Na 153, still not waking up to eat / not following commands. (+)MRSA  BCx, ID recs continue abx escalate linezolid  + ceftriaxone  + azithro + daptomycin .  12/11: more alert this afternoon, following commands, still not eating but SLP to revisit tomorrow. D/w family and palliative team today, continue current care, likely do not want to put him thorugh TEE, open to PICC and abx if his appetite is improving as long as he isn't suffering but niece notes she suspects 'his time is close' - will continue to revisit goals pending clinical progression  12/12: less alert today not following commands, appears more agitated. EEG global slowing. Still not eating. Family has made decision this afternoon for comfort measures.       Consultants:  ICU on admission Palliative Care  Infectious disease   Procedures/Surgeries: none      ASSESSMENT & PLAN:   Comfort measures status Discontinue aggressive treatment(s) aimed at cure Palliative / comfort focused care --> meds as below for symptomatic care, frequent assessments for symptoms, maintain comfort/dignity, he is stable at this time for hospice placement if this is available  Current Facility-Administered Medications    diphenhydrAMINE  (BENADRYL ) injection 12.5 mg   morphine  (PF) 2 MG/ML injection 1-4 mg   camphor-menthol  (SARNA) lotion   clobetasol  ointment (TEMOVATE ) 0.05 % 1 Application   glycopyrrolate (ROBINUL) tablet 1 mg **OR** glycopyrrolate (ROBINUL) injection 0.2 mg **OR** glycopyrrolate (ROBINUL) injection 0.2 mg   haloperidol (HALDOL) tablet 0.5 mg **OR** haloperidol (HALDOL) 2 MG/ML solution 0.5 mg **OR** haloperidol lactate (HALDOL) injection 0.5 mg   loperamide (IMODIUM) capsule 2  mg   LORazepam (ATIVAN) tablet 1 mg **OR** LORazepam (ATIVAN) 2 MG/ML concentrated solution 1 mg **OR** LORazepam (ATIVAN) injection 1 mg    Hospital problems: Severe sepsis, Present on admission  End organ dysfunction: acute respiratory failure, acute kidney injury, altered mental status, hypotension, tachycardia and  tachypnea.   Multifocal pneumonia Viral + bacterial pneumonia Concern for aspiration pneumonia / pneumonitis  MRSA bacteremia Acute respiratory failure with hypoxia   AKI (acute kidney injury) Likely secondary to infection and acute kidney injury / question RTA Acute metabolic encephalopathy Not responsive to treatment of underlying cause(s) Question underlying mild cognitive impairment / vascular dementia, imaging is not diagnostic but is supportive of this ddx  CT notes diffuse cerebral parenchymal volume loss and moderate chronic ischemic disease - question vascular dementia with acute worsening now d/t metabolic insults  Advanced care planning Right arm contracted  Bullous pemphigoid Hypernatremia High anion gap metabolic acidosis on admission AG now normal but persistent Cl high and CO2 low  Elevated liver function tests on admission Thrombocytopenia Iron  deficiency anemia        Borderline underweight based on BMI: Body mass index is 19.44 kg/m.SABRA Significantly low or high BMI is associated with higher medical risk.  Underweight - under 18  overweight - 25 to 29 obese - 30 or more Class 1 obesity: BMI of 30.0 to 34 Class 2 obesity: BMI of 35.0 to 39 Class 3 obesity: BMI of 40.0 to 49 Super Morbid Obesity: BMI 50-59 Super-super Morbid Obesity: BMI 60+ Healthy nutrition and physical activity advised as adjunct to other disease management and risk reduction treatments               Subjective / Brief ROS:  Patient not contributory Niece has some concerns that he seems uncomfortable today  Family Communication: discussion at bedside w/ niece Tammy and confirmed on phone w/ her later today that she has opted for comfort measures as we had discussed earlier at bedside and she had relayed to the palliative care team     Objective Findings:  Vitals:   06/02/24 2017 06/03/24 0500 06/03/24 0518 06/03/24 0759  BP: (!) 154/75  (!) 176/90 (!) 162/71  Pulse: 67   61 (!) 57  Resp: 20  16   Temp: 97.7 F (36.5 C)  98 F (36.7 C)   TempSrc: Oral     SpO2: 98%  100% 99%  Weight:  58 kg    Height:        Intake/Output Summary (Last 24 hours) at 06/03/2024 1658 Last data filed at 06/03/2024 0518 Gross per 24 hour  Intake --  Output 600 ml  Net -600 ml   Filed Weights   06/01/24 0500 06/02/24 0500 06/03/24 0500  Weight: 57.5 kg 58.9 kg 58 kg    Examination:  Physical Exam       Scheduled Medications:   clobetasol  ointment  1 Application Topical BID    Continuous Infusions:   PRN Medications:  camphor-menthol , diphenhydrAMINE , glycopyrrolate **OR** glycopyrrolate **OR** glycopyrrolate, haloperidol **OR** haloperidol **OR** haloperidol lactate, loperamide, LORazepam **OR** LORazepam **OR** LORazepam, morphine  injection  Antimicrobials from admission:  Anti-infectives (From admission, onward)    Start     Dose/Rate Route Frequency Ordered Stop   06/01/24 1800  DAPTOmycin  (CUBICIN ) IVPB 500 mg/29mL premix  Status:  Discontinued        8 mg/kg  57.5 kg 100 mL/hr over 30 Minutes Intravenous Every 48 hours 06/01/24 1539 06/03/24 1612   05/31/24 1300  azithromycin  (  ZITHROMAX ) 500 mg in sodium chloride  0.9 % 250 mL IVPB  Status:  Discontinued        500 mg 250 mL/hr over 60 Minutes Intravenous Every 24 hours 05/30/24 1250 06/03/24 1446   05/31/24 1200  cefTRIAXone  (ROCEPHIN ) 2 g in sodium chloride  0.9 % 100 mL IVPB  Status:  Discontinued        2 g 200 mL/hr over 30 Minutes Intravenous Every 24 hours 05/30/24 1250 06/03/24 1612   05/30/24 2200  linezolid  (ZYVOX ) IVPB 600 mg  Status:  Discontinued        600 mg 300 mL/hr over 60 Minutes Intravenous Every 12 hours 05/30/24 1906 06/03/24 1612   05/30/24 1215  cefTRIAXone  (ROCEPHIN ) 2 g in sodium chloride  0.9 % 100 mL IVPB        2 g 200 mL/hr over 30 Minutes Intravenous Once 05/30/24 1209 05/30/24 1350   05/30/24 1215  azithromycin  (ZITHROMAX ) 500 mg in sodium chloride  0.9 % 250 mL  IVPB        500 mg 250 mL/hr over 60 Minutes Intravenous  Once 05/30/24 1209 05/30/24 1534           Data Reviewed:  I have personally reviewed the following...  CBC: Recent Labs  Lab 05/30/24 1054 05/31/24 0313 06/01/24 0625 06/02/24 0356 06/03/24 0508  WBC 11.2* 7.1 8.4 7.7 13.3*  NEUTROABS 9.6*  --   --   --   --   HGB 10.4* 7.8* 8.5* 8.0* 9.1*  HCT 34.0* 24.3* 27.6* 25.6* 28.5*  MCV 110.4* 105.7* 106.6* 106.7* 105.2*  PLT 188 125* 145* 139* 136*   Basic Metabolic Panel: Recent Labs  Lab 05/31/24 0313 06/01/24 0411 06/02/24 0356 06/03/24 0508 06/03/24 1558  NA 150* 153* 157* 161* 159*  K 3.6 3.8 3.2* 3.4* 3.7  CL 119* 125* 126* 129* 129*  CO2 15* 15* 16* 19* 18*  GLUCOSE 122* 129* 140* 141* 132*  BUN 61* 64* 69* 67* 63*  CREATININE 2.32* 2.53* 2.56* 2.52* 2.34*  CALCIUM  7.9* 8.4* 8.2* 9.1 8.8*  MG 2.2  --   --   --   --   PHOS 4.6  --   --   --   --    GFR: Estimated Creatinine Clearance: 15.8 mL/min (A) (by C-G formula based on SCr of 2.34 mg/dL (H)). Liver Function Tests: Recent Labs  Lab 05/30/24 1054 05/30/24 2011 06/02/24 0356 06/03/24 0508  AST 173* 167* 44* 30  ALT 470* 344* 181* 161*  ALKPHOS 95 68 99 109  BILITOT 0.8 0.5 0.3 0.3  PROT 5.8* 4.5* 4.7* 5.4*  ALBUMIN 2.9* 2.5* 2.2* 2.8*   No results for input(s): LIPASE, AMYLASE in the last 168 hours. No results for input(s): AMMONIA in the last 168 hours. Coagulation Profile: Recent Labs  Lab 05/30/24 1235  INR 1.5*   Cardiac Enzymes: Recent Labs  Lab 06/02/24 0356  CKTOTAL 38*   BNP (last 3 results) No results for input(s): PROBNP in the last 8760 hours. HbA1C: No results for input(s): HGBA1C in the last 72 hours. CBG: Recent Labs  Lab 06/02/24 2355 06/03/24 0515 06/03/24 0801 06/03/24 1153 06/03/24 1651  GLUCAP 143* 123* 110* 135* 113*   Lipid Profile: No results for input(s): CHOL, HDL, LDLCALC, TRIG, CHOLHDL, LDLDIRECT in the last 72  hours. Thyroid  Function Tests: No results for input(s): TSH, T4TOTAL, FREET4, T3FREE, THYROIDAB in the last 72 hours. Anemia Panel: No results for input(s): VITAMINB12, FOLATE, FERRITIN, TIBC, IRON , RETICCTPCT in the last  72 hours. Most Recent Urinalysis On File:     Component Value Date/Time   COLORURINE YELLOW (A) 05/30/2024 1341   APPEARANCEUR HAZY (A) 05/30/2024 1341   APPEARANCEUR Clear 02/02/2023 1632   LABSPEC 1.014 05/30/2024 1341   PHURINE 5.0 05/30/2024 1341   GLUCOSEU NEGATIVE 05/30/2024 1341   HGBUR NEGATIVE 05/30/2024 1341   BILIRUBINUR NEGATIVE 05/30/2024 1341   BILIRUBINUR Negative 02/02/2023 1632   KETONESUR NEGATIVE 05/30/2024 1341   PROTEINUR NEGATIVE 05/30/2024 1341   NITRITE NEGATIVE 05/30/2024 1341   LEUKOCYTESUR NEGATIVE 05/30/2024 1341   Sepsis Labs: @LABRCNTIP (procalcitonin:4,lacticidven:4) Microbiology: Recent Results (from the past 240 hours)  Blood Culture (routine x 2)     Status: None (Preliminary result)   Collection Time: 05/30/24 10:54 AM   Specimen: BLOOD  Result Value Ref Range Status   Specimen Description BLOOD BLOOD LEFT ARM  Final   Special Requests   Final    BOTTLES DRAWN AEROBIC AND ANAEROBIC Blood Culture results may not be optimal due to an inadequate volume of blood received in culture bottles   Culture   Final    NO GROWTH 4 DAYS Performed at [facility], [address], [place], [place] [address]    Report Status PENDING  Incomplete  Blood Culture (routine x 2)     Status: Abnormal (Preliminary result)   Collection Time: 05/30/24 10:54 AM   Specimen: BLOOD LEFT HAND  Result Value Ref Range Status   Specimen Description   Final    BLOOD LEFT HAND Performed at [facility], [address] [place], [place] [address]    Special Requests   Final    BOTTLES DRAWN AEROBIC AND ANAEROBIC Blood Culture results may not be optimal due to an inadequate volume of blood received in culture  bottles Performed at [facility], [address] [place], [place] [address]    Culture  Setup Time   Final    GRAM POSITIVE COCCI ANAEROBIC BOTTLE ONLY CRITICAL RESULT CALLED TO, READ BACK BY AND VERIFIED WITH: WILL ANDERSON 06/01/24 1449 KLW    Culture (A)  Final    METHICILLIN RESISTANT STAPHYLOCOCCUS AUREUS Sent to [facility] for further susceptibility testing. Performed at [facility], [address], [place], [place] [address]    Report Status PENDING  Incomplete   Organism ID, Bacteria METHICILLIN RESISTANT STAPHYLOCOCCUS AUREUS  Final      Susceptibility   Methicillin resistant staphylococcus aureus - MIC*    CIPROFLOXACIN  >=8 RESISTANT Resistant     ERYTHROMYCIN >=8 RESISTANT Resistant     GENTAMICIN <=0.5 SENSITIVE Sensitive     OXACILLIN >=4 RESISTANT Resistant     TETRACYCLINE >=16 RESISTANT Resistant     VANCOMYCIN  1 SENSITIVE Sensitive     TRIMETH /SULFA  <=10 SENSITIVE Sensitive     CLINDAMYCIN >=8 RESISTANT Resistant     RIFAMPIN <=0.5 SENSITIVE Sensitive     Inducible Clindamycin NEGATIVE Sensitive     LINEZOLID  2 SENSITIVE Sensitive     * METHICILLIN RESISTANT STAPHYLOCOCCUS AUREUS  Resp panel by RT-PCR (RSV, Flu A&B, Covid) Anterior Nasal Swab     Status: None   Collection Time: 05/30/24 10:54 AM   Specimen: Anterior Nasal Swab  Result Value Ref Range Status   SARS Coronavirus 2 by RT PCR NEGATIVE NEGATIVE Final    Comment: (NOTE) SARS-CoV-2 target nucleic acids are NOT DETECTED.  The SARS-CoV-2 RNA is generally detectable in upper respiratory specimens during the acute phase of infection. The lowest concentration of SARS-CoV-2 viral  copies this assay can detect is 138 copies/mL. A negative result does not preclude SARS-Cov-2 infection and should not be used as the sole basis for treatment or other patient management decisions. A negative result may occur with  improper specimen collection/handling, submission of specimen other than  nasopharyngeal swab, presence of viral mutation(s) within the areas targeted by this assay, and inadequate number of viral copies(<138 copies/mL). A negative result must be combined with clinical observations, patient history, and epidemiological information. The expected result is Negative.  Fact Sheet for Patients:  bloggercourse.com  Fact Sheet for Healthcare Providers:  seriousbroker.it  This test is no t yet approved or cleared by the [place]  FDA and  has been authorized for detection and/or diagnosis of SARS-CoV-2 by FDA under an Emergency Use Authorization (EUA). This EUA will remain  in effect (meaning this test can be used) for the duration of the COVID-19 declaration under Section 564(b)(1) of the Act, 21 U.S.C.section 360bbb-3(b)(1), unless the authorization is terminated  or revoked sooner.       Influenza A by PCR NEGATIVE NEGATIVE Final   Influenza B by PCR NEGATIVE NEGATIVE Final    Comment: (NOTE) The Xpert Xpress SARS-CoV-2/FLU/RSV plus assay is intended as an aid in the diagnosis of influenza from Nasopharyngeal swab specimens and should not be used as a sole basis for treatment. Nasal washings and aspirates are unacceptable for Xpert Xpress SARS-CoV-2/FLU/RSV testing.  Fact Sheet for Patients: bloggercourse.com  Fact Sheet for Healthcare Providers: seriousbroker.it  This test is not yet approved or cleared by the [place]  FDA and has been authorized for detection and/or diagnosis of SARS-CoV-2 by FDA under an Emergency Use Authorization (EUA). This EUA will remain in effect (meaning this test can be used) for the duration of the COVID-19 declaration under Section 564(b)(1) of the Act, 21 U.S.C. section 360bbb-3(b)(1), unless the authorization is terminated or revoked.     Resp Syncytial Virus by PCR NEGATIVE NEGATIVE Final    Comment:  (NOTE) Fact Sheet for Patients: bloggercourse.com  Fact Sheet for Healthcare Providers: seriousbroker.it  This test is not yet approved or cleared by the [place]  FDA and has been authorized for detection and/or diagnosis of SARS-CoV-2 by FDA under an Emergency Use Authorization (EUA). This EUA will remain in effect (meaning this test can be used) for the duration of the COVID-19 declaration under Section 564(b)(1) of the Act, 21 U.S.C. section 360bbb-3(b)(1), unless the authorization is terminated or revoked.  Performed at [facility], [address] [place], [place] [address]   Blood Culture ID Panel (Reflexed)     Status: Abnormal   Collection Time: 05/30/24 10:54 AM  Result Value Ref Range Status   Enterococcus faecalis NOT DETECTED NOT DETECTED Final   Enterococcus Faecium NOT DETECTED NOT DETECTED Final   Listeria monocytogenes NOT DETECTED NOT DETECTED Final   Staphylococcus species DETECTED (A) NOT DETECTED Final    Comment: CRITICAL RESULT CALLED TO, READ BACK BY AND VERIFIED WITH: WILL ANDERSON 06/01/24 1449 KLW    Staphylococcus aureus (BCID) DETECTED (A) NOT DETECTED Final    Comment: Methicillin (oxacillin)-resistant Staphylococcus aureus (MRSA). MRSA is predictably resistant to beta-lactam antibiotics (except ceftaroline). Preferred therapy is vancomycin  unless clinically contraindicated. Patient requires contact precautions if  hospitalized. CRITICAL RESULT CALLED TO, READ BACK BY AND VERIFIED WITH: WILL ANDERSON 06/01/24 1449 KLW    Staphylococcus epidermidis NOT DETECTED NOT DETECTED Final   Staphylococcus lugdunensis NOT DETECTED NOT DETECTED Final   Streptococcus species NOT DETECTED NOT  DETECTED Final   Streptococcus agalactiae NOT DETECTED NOT DETECTED Final   Streptococcus pneumoniae NOT DETECTED NOT DETECTED Final   Streptococcus pyogenes NOT DETECTED NOT DETECTED Final    A.calcoaceticus-baumannii NOT DETECTED NOT DETECTED Final   Bacteroides fragilis NOT DETECTED NOT DETECTED Final   Enterobacterales NOT DETECTED NOT DETECTED Final   Enterobacter cloacae complex NOT DETECTED NOT DETECTED Final   Escherichia coli NOT DETECTED NOT DETECTED Final   Klebsiella aerogenes NOT DETECTED NOT DETECTED Final   Klebsiella oxytoca NOT DETECTED NOT DETECTED Final   Klebsiella pneumoniae NOT DETECTED NOT DETECTED Final   Proteus species NOT DETECTED NOT DETECTED Final   Salmonella species NOT DETECTED NOT DETECTED Final   Serratia marcescens NOT DETECTED NOT DETECTED Final   Haemophilus influenzae NOT DETECTED NOT DETECTED Final   Neisseria meningitidis NOT DETECTED NOT DETECTED Final   Pseudomonas aeruginosa NOT DETECTED NOT DETECTED Final   Stenotrophomonas maltophilia NOT DETECTED NOT DETECTED Final   Candida albicans NOT DETECTED NOT DETECTED Final   Candida auris NOT DETECTED NOT DETECTED Final   Candida glabrata NOT DETECTED NOT DETECTED Final   Candida krusei NOT DETECTED NOT DETECTED Final   Candida parapsilosis NOT DETECTED NOT DETECTED Final   Candida tropicalis NOT DETECTED NOT DETECTED Final   Cryptococcus neoformans/gattii NOT DETECTED NOT DETECTED Final   Meth resistant mecA/C and MREJ DETECTED (A) NOT DETECTED Final    Comment: CRITICAL RESULT CALLED TO, READ BACK BY AND VERIFIED WITH: WILL ANDERSON 06/01/24 1449 KLW Performed at [facility], [address] [place], [place] [address]   MRSA Next Gen by PCR, Nasal     Status: Abnormal   Collection Time: 05/30/24 12:49 PM   Specimen: Nasal Mucosa; Nasal Swab  Result Value Ref Range Status   MRSA by PCR Next Gen DETECTED (A) NOT DETECTED Final    Comment: RESULT CALLED TO, READ BACK BY AND VERIFIED WITH: MYRA FLOWERS 1813 05/30/24 MU (NOTE) The GeneXpert MRSA Assay (FDA approved for NASAL specimens only), is one component of a comprehensive MRSA colonization surveillance program. It is  not intended to diagnose MRSA infection nor to guide or monitor treatment for MRSA infections. Test performance is not FDA approved in patients less than 65 years old. Performed at [facility], [address] [place], [place] [address]   Respiratory (~20 pathogens) panel by PCR     Status: Abnormal   Collection Time: 05/30/24  1:55 PM   Specimen: Nasopharyngeal Swab; Respiratory  Result Value Ref Range Status   Adenovirus NOT DETECTED NOT DETECTED Final   Coronavirus 229E NOT DETECTED NOT DETECTED Final    Comment: (NOTE) The Coronavirus on the Respiratory Panel, DOES NOT test for the novel  Coronavirus (2019 nCoV)    Coronavirus HKU1 NOT DETECTED NOT DETECTED Final   Coronavirus NL63 NOT DETECTED NOT DETECTED Final   Coronavirus OC43 NOT DETECTED NOT DETECTED Final   Metapneumovirus DETECTED (A) NOT DETECTED Final   Rhinovirus / Enterovirus DETECTED (A) NOT DETECTED Final   Influenza A NOT DETECTED NOT DETECTED Final   Influenza B NOT DETECTED NOT DETECTED Final   Parainfluenza Virus 1 NOT DETECTED NOT DETECTED Final   Parainfluenza Virus 2 NOT DETECTED NOT DETECTED Final   Parainfluenza Virus 3 NOT DETECTED NOT DETECTED Final   Parainfluenza Virus 4 NOT DETECTED NOT DETECTED Final   Respiratory Syncytial Virus NOT DETECTED NOT DETECTED Final   Bordetella pertussis NOT DETECTED NOT DETECTED Final   Bordetella Parapertussis NOT DETECTED NOT  DETECTED Final   Chlamydophila pneumoniae NOT DETECTED NOT DETECTED Final   Mycoplasma pneumoniae NOT DETECTED NOT DETECTED Final    Comment: Performed at [facility], [address] [place], [place] [address]  Culture, blood (single) w Reflex to ID Panel     Status: None (Preliminary result)   Collection Time: 06/01/24  5:20 PM   Specimen: BLOOD  Result Value Ref Range Status   Specimen Description BLOOD BLOOD RIGHT HAND  Final   Special Requests   Final    BOTTLES DRAWN AEROBIC AND ANAEROBIC Blood Culture results may  not be optimal due to an inadequate volume of blood received in culture bottles   Culture   Final    NO GROWTH 2 DAYS Performed at [facility], [address] [place], [place] [address]    Report Status PENDING  Incomplete      Radiology Studies last 3 days: EEG adult Result Date: 06/03/2024 Shelton Arlin KIDD, MD     06/03/2024  2:17 PM Patient Name: MICKIE KOZIKOWSKI MRN: [id number] Epilepsy Attending: Arlin KIDD Shelton Referring Physician/Provider: Germaine Raring, MD Date: 06/03/2024 Duration: 40.29 mins Patient history: 89 year old male with altered mental status.  EEG to look for seizure. Level of alertness: Awake AEDs during EEG study:  None Technical aspects: This EEG study was done with scalp electrodes positioned according to the 10-20 International system of electrode placement. Electrical activity was reviewed with band pass filter of 1-70Hz , sensitivity of 7 uV/mm, display speed of 16mm/sec with a 60Hz  notched filter applied as appropriate. EEG data were recorded continuously and digitally stored.  Video monitoring was available and reviewed as appropriate. Description: EEG showed continuous generalized 5 to 7 Hz theta slowing admixed with intermittent 2 to 3 Hz delta slowing.  Hyperventilation and photic stimulation were not performed.   Of note, study was technically difficult due to significant myogenic artifact. ABNORMALITY - Continuous slow, generalized IMPRESSION: This technically difficult study suggestive of diffuse cerebral dysfunction (encephalopathy).  No seizures or epileptiform discharges were seen throughout the recording. Priyanka KIDD Shelton   CT HEAD WO CONTRAST ( ) Result Date: 06/02/2024 EXAM: CT HEAD WITHOUT 06/02/2024 02:53:51 PM TECHNIQUE: CT of the head was performed without the administration of intravenous contrast. Automated exposure control, iterative reconstruction, and/or weight based adjustment of the mA/kV was utilized to reduce the radiation dose  to as low as reasonably achievable. COMPARISON: Head CT 05/31/2024 and MRI 09/29/2023. CLINICAL HISTORY: Mental status change, persistent or worsening. FINDINGS: BRAIN AND VENTRICLES: There is no evidence of an acute infarct, intracranial hemorrhage, mass, midline shift, hydrocephalus, or extra-axial fluid collection. There is mild to moderate cerebral atrophy. Asymmetric volume loss is again noted in the mesial left temporal lobe. Patchy cerebral white matter hypodensities are unchanged and nonspecific but compatible with mild chronic small vessel ischemic disease. Calcified atherosclerosis at the skull base. ORBITS: Bilateral cataract extraction. SINUSES AND MASTOIDS: Mild mucosal thickening in the ethmoid sinuses. Minimal bilateral mastoid fluid. SOFT TISSUES AND SKULL: No acute skull fracture. No acute soft tissue abnormality. IMPRESSION: 1. No acute intracranial abnormality. 2. Mild chronic small vessel ischemic disease. Electronically signed by: Dasie Hamburg MD 06/02/2024 04:18 PM EST RP Workstation: [id number]   ECHOCARDIOGRAM COMPLETE Result Date: 06/02/2024    ECHOCARDIOGRAM REPORT   Patient Name:   MAKYI LEDO Date of Exam: 06/01/2024 Medical Rec #:  [id number]         Height:       68.0 in Accession #:  [id number]        Weight:       126.8 lb Date of Birth:  1928/05/05         BSA:          1.684 m Patient Age:    89 years          BP:           112/71 mmHg Patient Gender: M                 HR:           91 bpm. Exam Location:  [facility] Procedure: 2D Echo, Cardiac Doppler and Color Doppler (Both Spectral and Color            Flow Doppler were utilized during procedure). Indications:     R78.81 Bacteremia.  History:         Patient has prior history of Echocardiogram examinations, most                  recent 12/12/2021. CAD; Risk Factors:Hypertension and                  Dyslipidemia.  Sonographer:     Carl Coma RDCS Referring Phys:  [id number] DAVID P FITZGERALD Diagnosing Phys: Evalene Lunger MD IMPRESSIONS  1. No valve vegetation noted  2. Left ventricular ejection fraction, by estimation, is 55 to 60%. The left ventricle has normal function. The left ventricle has no regional wall motion abnormalities. There is mild left ventricular hypertrophy. Left ventricular diastolic parameters are consistent with Grade I diastolic dysfunction (impaired relaxation).  3. Right ventricular systolic function is normal. The right ventricular size is normal. There is normal pulmonary artery systolic pressure. The estimated right ventricular systolic pressure is 33.5 mmHg.  4. The mitral valve is normal in structure. Mild mitral valve regurgitation. No evidence of mitral stenosis. Moderate mitral annular calcification.  5. The aortic valve is calcified. Aortic valve regurgitation is not visualized. Mild to moderate aortic valve stenosis. Aortic valve area, by VTI measures 1.54 cm. Aortic valve mean gradient measures 14.0 mmHg. Aortic valve Vmax measures 2.51 m/s.  6. There is moderate dilatation of the ascending aorta, measuring 46 mm.  7. The inferior vena cava is normal in size with greater than 50% respiratory variability, suggesting right atrial pressure of 3 mmHg.  8. Frequent PVCs FINDINGS  Left Ventricle: Left ventricular ejection fraction, by estimation, is 55 to 60%. The left ventricle has normal function. The left ventricle has no regional wall motion abnormalities. Strain was performed and the global longitudinal strain is indeterminate. The left ventricular internal cavity size was normal in size. There is mild left ventricular hypertrophy. Left ventricular diastolic parameters are consistent with Grade I diastolic dysfunction (impaired relaxation). Right Ventricle: The right ventricular size is normal. No increase in right ventricular wall thickness. Right ventricular systolic function is normal. There is normal pulmonary artery systolic pressure. The tricuspid regurgitant velocity is 2.67 m/s, and   with an assumed right atrial pressure of 5 mmHg, the estimated right ventricular systolic pressure is 33.5 mmHg. Left Atrium: Left atrial size was normal in size. Right Atrium: Right atrial size was normal in size. Pericardium: There is no evidence of pericardial effusion. Mitral Valve: The mitral valve is normal in structure. There is mild calcification of the mitral valve leaflet(s). Moderate mitral annular calcification. Mild mitral valve regurgitation. No evidence of mitral valve stenosis. Tricuspid Valve: The tricuspid valve is normal in structure.  Tricuspid valve regurgitation is mild . No evidence of tricuspid stenosis. The aortic valve is calcified. Aortic valve regurgitation is not visualized. Mild to moderate aortic stenosis is present. Pulmonic Valve: The pulmonic valve was normal in structure. Pulmonic valve regurgitation is not visualized. No evidence of pulmonic stenosis. Aorta: The aortic root is normal in size and structure. There is moderate dilatation of the ascending aorta, measuring 46 mm. Venous: The inferior vena cava is normal in size with greater than 50% respiratory variability, suggesting right atrial pressure of 3 mmHg. IAS/Shunts: No atrial level shunt detected by color flow Doppler. Additional Comments: 3D was performed not requiring image post processing on an independent workstation and was indeterminate.  LEFT VENTRICLE PLAX 2D LVIDd:         4.50 cm   Diastology LVIDs:         2.30 cm   LV e' medial:    4.90 cm/s LV PW:         0.80 cm   LV E/e' medial:  16.0 LV IVS:        0.90 cm   LV e' lateral:   5.87 cm/s LVOT diam:     2.30 cm   LV E/e' lateral: 13.3 LV SV:         66 LV SV Index:   39 LVOT Area:     4.15 cm  RIGHT VENTRICLE RV Basal diam:  3.30 cm RV S prime:     12.97 cm/s TAPSE (M-mode): 2.4 cm LEFT ATRIUM           Index        RIGHT ATRIUM          Index LA diam:      4.30 cm 2.55 cm/m   RA Area:     9.52 cm LA Vol (A2C): 36.3 ml 21.56 ml/m  RA Volume:   17.40 ml 10.33  ml/m LA Vol (A4C): 65.5 ml 38.90 ml/m  AORTIC VALVE AV Area (Vmax):    1.41 cm AV Area (Vmean):   1.54 cm AV Area (VTI):     1.54 cm AV Vmax:           250.75 cm/s AV Vmean:          174.250 cm/s AV VTI:            0.432 m AV Peak Grad:      25.2 mmHg AV Mean Grad:      14.0 mmHg LVOT Vmax:         85.23 cm/s LVOT Vmean:        64.500 cm/s LVOT VTI:          0.160 m LVOT/AV VTI ratio: 0.37  AORTA Ao Root diam: 4.00 cm Ao Asc diam:  4.60 cm MITRAL VALVE                TRICUSPID VALVE MV Area (PHT): 3.30 cm     TR Peak grad:   28.5 mmHg MV Decel Time: 230 msec     TR Vmax:        267.00 cm/s MV E velocity: 78.25 cm/s MV A velocity: 135.50 cm/s  SHUNTS MV E/A ratio:  0.58         Systemic VTI:  0.16 m                             Systemic Diam: 2.30 cm Evalene Lunger MD Electronically signed  by Evalene Lunger MD Signature Date/Time: 06/02/2024/1:00:41 PM    Final    CT HEAD WO CONTRAST ( ) Result Date: 05/31/2024 EXAM: CT HEAD WITHOUT 05/31/2024 11:05:29 AM TECHNIQUE: CT of the head was performed without the administration of intravenous contrast. Automated exposure control, iterative reconstruction, and/or weight based adjustment of the mA/kV was utilized to reduce the radiation dose to as low as reasonably achievable. COMPARISON: 12/06/2023 CLINICAL HISTORY: Mental status change, unknown cause FINDINGS: BRAIN AND VENTRICLES: No acute intracranial hemorrhage. No mass effect or midline shift. No extra-axial fluid collection. No evidence of acute infarct. No hydrocephalus. Proportional prominence of ventricles and sulci, consistent with diffuse cerebral parenchymal volume loss. Periventricular and subcortical white matter hypoattenuation, consistent with moderate chronic ischemic microvascular disease. Calcified atherosclerotic plaque within cavernous/supraclinoid internal carotid arteries. ORBITS: No acute abnormality. Bilateral lens replacements. SINUSES AND MASTOIDS: Mild scattered mucosal thickening in the  ethmoid sinuses. Trace bilateral mastoid effusions. SOFT TISSUES AND SKULL: No acute skull fracture. No acute soft tissue abnormality. IMPRESSION: 1. No acute intracranial abnormality. 2. Diffuse cerebral parenchymal volume loss. 3. Moderate chronic ischemic microvascular disease. Electronically signed by: Donnice Mania MD 05/31/2024 10:53 PM EST RP Workstation: [id number]       Time spent: 50 min     Laneta Blunt, DO [facility] 06/03/2024, 4:58 PM    Dictation software may have been used to generate the above note. Typos may occur and escape review in typed/dictated notes. Please contact Dr Blunt directly for clarity if needed.  Staff may message me via secure chat in Epic  but this may not receive an immediate response,  please page me for urgent matters!  If 7PM-7AM, please contact night coverage [email or web address]

## 2024-06-03 NOTE — Progress Notes (Signed)
 Eeg done

## 2024-06-03 NOTE — Progress Notes (Signed)
 Subjective: Patient stable but not eating.  No new neurological complaints.  Has wanted his O2 off as well.  Has expressed to niece that he is tired.    Objective: Current vital signs: BP (!) 162/71 (BP Location: Left Arm)   Pulse (!) 57   Temp 98 F (36.7 C)   Resp 16   Ht 5' 8 (1.727 m)   Wt 58 kg   SpO2 99%   BMI 19.44 kg/m  Vital signs in last 24 hours: Temp:  [97.4 F (36.3 C)-98 F (36.7 C)] 98 F (36.7 C) (12/12 0518) Pulse Rate:  [57-68] 57 (12/12 0759) Resp:  [16-20] 16 (12/12 0518) BP: (115-176)/(71-91) 162/71 (12/12 0759) SpO2:  [80 %-100 %] 99 % (12/12 0759) Weight:  [58 kg] 58 kg (12/12 0500)  Intake/Output from previous day: 12/11 0701 - 12/12 0700 In: -  Out: 800 [Urine:800] Intake/Output this shift: No intake/output data recorded. Nutritional status:  Diet Order             Diet NPO time specified Except for: Ice Chips  Diet effective now                   Neurologic Exam: Mental Status: Lethargic.  Opens eyes with stimulation and follows simple commands.   Cranial Nerves: Right facial droop Motor: Increased tone with right hemiparesis Sensory: Responds to light noxious stimuli bilaterally   Lab Results: Basic Metabolic Panel: Recent Labs  Lab 05/30/24 2011 05/31/24 0313 06/01/24 0411 06/02/24 0356 06/03/24 0508  NA 151* 150* 153* 157* 161*  K 3.6 3.6 3.8 3.2* 3.4*  CL 120* 119* 125* 126* 129*  CO2 15* 15* 15* 16* 19*  GLUCOSE 85 122* 129* 140* 141*  BUN 62* 61* 64* 69* 67*  CREATININE 2.43* 2.32* 2.53* 2.56* 2.52*  CALCIUM  7.9* 7.9* 8.4* 8.2* 9.1  MG  --  2.2  --   --   --   PHOS  --  4.6  --   --   --     Liver Function Tests: Recent Labs  Lab 05/30/24 1054 05/30/24 2011 06/02/24 0356 06/03/24 0508  AST 173* 167* 44* 30  ALT 470* 344* 181* 161*  ALKPHOS 95 68 99 109  BILITOT 0.8 0.5 0.3 0.3  PROT 5.8* 4.5* 4.7* 5.4*  ALBUMIN 2.9* 2.5* 2.2* 2.8*   No results for input(s): LIPASE, AMYLASE in the last 168  hours. No results for input(s): AMMONIA in the last 168 hours.  CBC: Recent Labs  Lab 05/30/24 1054 05/31/24 0313 06/01/24 0625 06/02/24 0356 06/03/24 0508  WBC 11.2* 7.1 8.4 7.7 13.3*  NEUTROABS 9.6*  --   --   --   --   HGB 10.4* 7.8* 8.5* 8.0* 9.1*  HCT 34.0* 24.3* 27.6* 25.6* 28.5*  MCV 110.4* 105.7* 106.6* 106.7* 105.2*  PLT 188 125* 145* 139* 136*    Cardiac Enzymes: Recent Labs  Lab 06/02/24 0356  CKTOTAL 38*    Lipid Panel: No results for input(s): CHOL, TRIG, HDL, CHOLHDL, VLDL, LDLCALC in the last 168 hours.  CBG: Recent Labs  Lab 06/02/24 2017 06/02/24 2355 06/03/24 0515 06/03/24 0801 06/03/24 1153  GLUCAP 143* 143* 123* 110* 135*    Microbiology: Results for orders placed or performed during the hospital encounter of 05/30/24  Blood Culture (routine x 2)     Status: None (Preliminary result)   Collection Time: 05/30/24 10:54 AM   Specimen: BLOOD  Result Value Ref Range Status   Specimen Description BLOOD  BLOOD LEFT ARM  Final   Special Requests   Final    BOTTLES DRAWN AEROBIC AND ANAEROBIC Blood Culture results may not be optimal due to an inadequate volume of blood received in culture bottles   Culture   Final    NO GROWTH 4 DAYS Performed at [facility], [address] [place], [place] [address]    Report Status PENDING  Incomplete  Blood Culture (routine x 2)     Status: Abnormal (Preliminary result)   Collection Time: 05/30/24 10:54 AM   Specimen: BLOOD LEFT HAND  Result Value Ref Range Status   Specimen Description   Final    BLOOD LEFT HAND Performed at [facility], [address] [place], [place] [address]    Special Requests   Final    BOTTLES DRAWN AEROBIC AND ANAEROBIC Blood Culture results may not be optimal due to an inadequate volume of blood received in culture bottles Performed at [facility], [address] [place], [place] [address]    Culture  Setup Time   Final    GRAM  POSITIVE COCCI ANAEROBIC BOTTLE ONLY CRITICAL RESULT CALLED TO, READ BACK BY AND VERIFIED WITH: WILL ANDERSON 06/01/24 1449 KLW    Culture (A)  Final    METHICILLIN RESISTANT STAPHYLOCOCCUS AUREUS Sent to [facility] for further susceptibility testing. Performed at [facility], [address], [place], [place] [address]    Report Status PENDING  Incomplete   Organism ID, Bacteria METHICILLIN RESISTANT STAPHYLOCOCCUS AUREUS  Final      Susceptibility   Methicillin resistant staphylococcus aureus - MIC*    CIPROFLOXACIN  >=8 RESISTANT Resistant     ERYTHROMYCIN >=8 RESISTANT Resistant     GENTAMICIN <=0.5 SENSITIVE Sensitive     OXACILLIN >=4 RESISTANT Resistant     TETRACYCLINE >=16 RESISTANT Resistant     VANCOMYCIN  1 SENSITIVE Sensitive     TRIMETH /SULFA  <=10 SENSITIVE Sensitive     CLINDAMYCIN >=8 RESISTANT Resistant     RIFAMPIN <=0.5 SENSITIVE Sensitive     Inducible Clindamycin NEGATIVE Sensitive     LINEZOLID  2 SENSITIVE Sensitive     * METHICILLIN RESISTANT STAPHYLOCOCCUS AUREUS  Resp panel by RT-PCR (RSV, Flu A&B, Covid) Anterior Nasal Swab     Status: None   Collection Time: 05/30/24 10:54 AM   Specimen: Anterior Nasal Swab  Result Value Ref Range Status   SARS Coronavirus 2 by RT PCR NEGATIVE NEGATIVE Final    Comment: (NOTE) SARS-CoV-2 target nucleic acids are NOT DETECTED.  The SARS-CoV-2 RNA is generally detectable in upper respiratory specimens during the acute phase of infection. The lowest concentration of SARS-CoV-2 viral copies this assay can detect is 138 copies/mL. A negative result does not preclude SARS-Cov-2 infection and should not be used as the sole basis for treatment or other patient management decisions. A negative result may occur with  improper specimen collection/handling, submission of specimen other than nasopharyngeal swab, presence of viral mutation(s) within the areas targeted by this assay, and inadequate number of viral copies(<138  copies/mL). A negative result must be combined with clinical observations, patient history, and epidemiological information. The expected result is Negative.  Fact Sheet for Patients:  bloggercourse.com  Fact Sheet for Healthcare Providers:  seriousbroker.it  This test is no t yet approved or cleared by the [place]  FDA and  has been authorized for detection and/or diagnosis of SARS-CoV-2 by FDA under an Emergency Use Authorization (EUA). This EUA will remain  in effect (  meaning this test can be used) for the duration of the COVID-19 declaration under Section 564(b)(1) of the Act, 21 U.S.C.section 360bbb-3(b)(1), unless the authorization is terminated  or revoked sooner.       Influenza A by PCR NEGATIVE NEGATIVE Final   Influenza B by PCR NEGATIVE NEGATIVE Final    Comment: (NOTE) The Xpert Xpress SARS-CoV-2/FLU/RSV plus assay is intended as an aid in the diagnosis of influenza from Nasopharyngeal swab specimens and should not be used as a sole basis for treatment. Nasal washings and aspirates are unacceptable for Xpert Xpress SARS-CoV-2/FLU/RSV testing.  Fact Sheet for Patients: bloggercourse.com  Fact Sheet for Healthcare Providers: seriousbroker.it  This test is not yet approved or cleared by the [place]  FDA and has been authorized for detection and/or diagnosis of SARS-CoV-2 by FDA under an Emergency Use Authorization (EUA). This EUA will remain in effect (meaning this test can be used) for the duration of the COVID-19 declaration under Section 564(b)(1) of the Act, 21 U.S.C. section 360bbb-3(b)(1), unless the authorization is terminated or revoked.     Resp Syncytial Virus by PCR NEGATIVE NEGATIVE Final    Comment: (NOTE) Fact Sheet for Patients: bloggercourse.com  Fact Sheet for Healthcare  Providers: seriousbroker.it  This test is not yet approved or cleared by the [place]  FDA and has been authorized for detection and/or diagnosis of SARS-CoV-2 by FDA under an Emergency Use Authorization (EUA). This EUA will remain in effect (meaning this test can be used) for the duration of the COVID-19 declaration under Section 564(b)(1) of the Act, 21 U.S.C. section 360bbb-3(b)(1), unless the authorization is terminated or revoked.  Performed at [facility], [address] [place], [place] [address]   Blood Culture ID Panel (Reflexed)     Status: Abnormal   Collection Time: 05/30/24 10:54 AM  Result Value Ref Range Status   Enterococcus faecalis NOT DETECTED NOT DETECTED Final   Enterococcus Faecium NOT DETECTED NOT DETECTED Final   Listeria monocytogenes NOT DETECTED NOT DETECTED Final   Staphylococcus species DETECTED (A) NOT DETECTED Final    Comment: CRITICAL RESULT CALLED TO, READ BACK BY AND VERIFIED WITH: WILL ANDERSON 06/01/24 1449 KLW    Staphylococcus aureus (BCID) DETECTED (A) NOT DETECTED Final    Comment: Methicillin (oxacillin)-resistant Staphylococcus aureus (MRSA). MRSA is predictably resistant to beta-lactam antibiotics (except ceftaroline). Preferred therapy is vancomycin  unless clinically contraindicated. Patient requires contact precautions if  hospitalized. CRITICAL RESULT CALLED TO, READ BACK BY AND VERIFIED WITH: WILL ANDERSON 06/01/24 1449 KLW    Staphylococcus epidermidis NOT DETECTED NOT DETECTED Final   Staphylococcus lugdunensis NOT DETECTED NOT DETECTED Final   Streptococcus species NOT DETECTED NOT DETECTED Final   Streptococcus agalactiae NOT DETECTED NOT DETECTED Final   Streptococcus pneumoniae NOT DETECTED NOT DETECTED Final   Streptococcus pyogenes NOT DETECTED NOT DETECTED Final   A.calcoaceticus-baumannii NOT DETECTED NOT DETECTED Final   Bacteroides fragilis NOT DETECTED NOT DETECTED Final    Enterobacterales NOT DETECTED NOT DETECTED Final   Enterobacter cloacae complex NOT DETECTED NOT DETECTED Final   Escherichia coli NOT DETECTED NOT DETECTED Final   Klebsiella aerogenes NOT DETECTED NOT DETECTED Final   Klebsiella oxytoca NOT DETECTED NOT DETECTED Final   Klebsiella pneumoniae NOT DETECTED NOT DETECTED Final   Proteus species NOT DETECTED NOT DETECTED Final   Salmonella species NOT DETECTED NOT DETECTED Final   Serratia marcescens NOT DETECTED NOT DETECTED Final   Haemophilus influenzae NOT DETECTED NOT DETECTED Final   Neisseria meningitidis NOT DETECTED  NOT DETECTED Final   Pseudomonas aeruginosa NOT DETECTED NOT DETECTED Final   Stenotrophomonas maltophilia NOT DETECTED NOT DETECTED Final   Candida albicans NOT DETECTED NOT DETECTED Final   Candida auris NOT DETECTED NOT DETECTED Final   Candida glabrata NOT DETECTED NOT DETECTED Final   Candida krusei NOT DETECTED NOT DETECTED Final   Candida parapsilosis NOT DETECTED NOT DETECTED Final   Candida tropicalis NOT DETECTED NOT DETECTED Final   Cryptococcus neoformans/gattii NOT DETECTED NOT DETECTED Final   Meth resistant mecA/C and MREJ DETECTED (A) NOT DETECTED Final    Comment: CRITICAL RESULT CALLED TO, READ BACK BY AND VERIFIED WITH: WILL ANDERSON 06/01/24 1449 KLW Performed at [facility], [address] [place], [place] [address]   MRSA Next Gen by PCR, Nasal     Status: Abnormal   Collection Time: 05/30/24 12:49 PM   Specimen: Nasal Mucosa; Nasal Swab  Result Value Ref Range Status   MRSA by PCR Next Gen DETECTED (A) NOT DETECTED Final    Comment: RESULT CALLED TO, READ BACK BY AND VERIFIED WITH: MYRA FLOWERS [id number] 05/30/24 MU (NOTE) The GeneXpert MRSA Assay (FDA approved for NASAL specimens only), is one component of a comprehensive MRSA colonization surveillance program. It is not intended to diagnose MRSA infection nor to guide or monitor treatment for MRSA infections. Test performance is  not FDA approved in patients less than 27 years old. Performed at [facility], [address] [place], [place] [address]   Respiratory (~20 pathogens) panel by PCR     Status: Abnormal   Collection Time: 05/30/24  1:55 PM   Specimen: Nasopharyngeal Swab; Respiratory  Result Value Ref Range Status   Adenovirus NOT DETECTED NOT DETECTED Final   Coronavirus 229E NOT DETECTED NOT DETECTED Final    Comment: (NOTE) The Coronavirus on the Respiratory Panel, DOES NOT test for the novel  Coronavirus (2019 nCoV)    Coronavirus HKU1 NOT DETECTED NOT DETECTED Final   Coronavirus NL63 NOT DETECTED NOT DETECTED Final   Coronavirus OC43 NOT DETECTED NOT DETECTED Final   Metapneumovirus DETECTED (A) NOT DETECTED Final   Rhinovirus / Enterovirus DETECTED (A) NOT DETECTED Final   Influenza A NOT DETECTED NOT DETECTED Final   Influenza B NOT DETECTED NOT DETECTED Final   Parainfluenza Virus 1 NOT DETECTED NOT DETECTED Final   Parainfluenza Virus 2 NOT DETECTED NOT DETECTED Final   Parainfluenza Virus 3 NOT DETECTED NOT DETECTED Final   Parainfluenza Virus 4 NOT DETECTED NOT DETECTED Final   Respiratory Syncytial Virus NOT DETECTED NOT DETECTED Final   Bordetella pertussis NOT DETECTED NOT DETECTED Final   Bordetella Parapertussis NOT DETECTED NOT DETECTED Final   Chlamydophila pneumoniae NOT DETECTED NOT DETECTED Final   Mycoplasma pneumoniae NOT DETECTED NOT DETECTED Final    Comment: Performed at [facility], [address] [place], [place] [address]  Culture, blood (single) w Reflex to ID Panel     Status: None (Preliminary result)   Collection Time: 06/01/24  5:20 PM   Specimen: BLOOD  Result Value Ref Range Status   Specimen Description BLOOD BLOOD RIGHT HAND  Final   Special Requests   Final    BOTTLES DRAWN AEROBIC AND ANAEROBIC Blood Culture results may not be optimal due to an inadequate volume of blood received in culture bottles   Culture   Final    NO GROWTH 2  DAYS Performed at [facility], [address], [place], [place] [address]  Report Status PENDING  Incomplete    Coagulation Studies: No results for input(s): LABPROT, INR in the last 72 hours.  Imaging: CT HEAD WO CONTRAST ( ) Result Date: 06/02/2024 EXAM: CT HEAD WITHOUT 06/02/2024 02:53:51 PM TECHNIQUE: CT of the head was performed without the administration of intravenous contrast. Automated exposure control, iterative reconstruction, and/or weight based adjustment of the mA/kV was utilized to reduce the radiation dose to as low as reasonably achievable. COMPARISON: Head CT 05/31/2024 and MRI 09/29/2023. CLINICAL HISTORY: Mental status change, persistent or worsening. FINDINGS: BRAIN AND VENTRICLES: There is no evidence of an acute infarct, intracranial hemorrhage, mass, midline shift, hydrocephalus, or extra-axial fluid collection. There is mild to moderate cerebral atrophy. Asymmetric volume loss is again noted in the mesial left temporal lobe. Patchy cerebral white matter hypodensities are unchanged and nonspecific but compatible with mild chronic small vessel ischemic disease. Calcified atherosclerosis at the skull base. ORBITS: Bilateral cataract extraction. SINUSES AND MASTOIDS: Mild mucosal thickening in the ethmoid sinuses. Minimal bilateral mastoid fluid. SOFT TISSUES AND SKULL: No acute skull fracture. No acute soft tissue abnormality. IMPRESSION: 1. No acute intracranial abnormality. 2. Mild chronic small vessel ischemic disease. Electronically signed by: Dasie Hamburg MD 06/02/2024 04:18 PM EST RP Workstation: [id number]   ECHOCARDIOGRAM COMPLETE Result Date: 06/02/2024    ECHOCARDIOGRAM REPORT   Patient Name:   Nicholas Gross Date of Exam: 06/01/2024 Medical Rec #:  [id number]         Height:       68.0 in Accession #:    [id number]        Weight:       126.8 lb Date of Birth:  [birth year]         BSA:          1.684 m Patient Age:    89 years          BP:            112/71 mmHg Patient Gender: M                 HR:           91 bpm. Exam Location:  [facility] Procedure: 2D Echo, Cardiac Doppler and Color Doppler (Both Spectral and Color            Flow Doppler were utilized during procedure). Indications:     R78.81 Bacteremia.  History:         Patient has prior history of Echocardiogram examinations, most                  recent 12/12/2021. CAD; Risk Factors:Hypertension and                  Dyslipidemia.  Sonographer:     Carl Coma RDCS Referring Phys:  [id number] DAVID P FITZGERALD Diagnosing Phys: Evalene Lunger MD IMPRESSIONS  1. No valve vegetation noted  2. Left ventricular ejection fraction, by estimation, is 55 to 60%. The left ventricle has normal function. The left ventricle has no regional wall motion abnormalities. There is mild left ventricular hypertrophy. Left ventricular diastolic parameters are consistent with Grade I diastolic dysfunction (impaired relaxation).  3. Right ventricular systolic function is normal. The right ventricular size is normal. There is normal pulmonary artery systolic pressure. The estimated right ventricular systolic pressure is 33.5 mmHg.  4. The mitral valve is normal in structure. Mild mitral valve regurgitation. No evidence of mitral stenosis. Moderate mitral annular calcification.  5. The aortic valve is  calcified. Aortic valve regurgitation is not visualized. Mild to moderate aortic valve stenosis. Aortic valve area, by VTI measures 1.54 cm. Aortic valve mean gradient measures 14.0 mmHg. Aortic valve Vmax measures 2.51 m/s.  6. There is moderate dilatation of the ascending aorta, measuring 46 mm.  7. The inferior vena cava is normal in size with greater than 50% respiratory variability, suggesting right atrial pressure of 3 mmHg.  8. Frequent PVCs FINDINGS  Left Ventricle: Left ventricular ejection fraction, by estimation, is 55 to 60%. The left ventricle has normal function. The left ventricle has no regional wall motion  abnormalities. Strain was performed and the global longitudinal strain is indeterminate. The left ventricular internal cavity size was normal in size. There is mild left ventricular hypertrophy. Left ventricular diastolic parameters are consistent with Grade I diastolic dysfunction (impaired relaxation). Right Ventricle: The right ventricular size is normal. No increase in right ventricular wall thickness. Right ventricular systolic function is normal. There is normal pulmonary artery systolic pressure. The tricuspid regurgitant velocity is 2.67 m/s, and  with an assumed right atrial pressure of 5 mmHg, the estimated right ventricular systolic pressure is 33.5 mmHg. Left Atrium: Left atrial size was normal in size. Right Atrium: Right atrial size was normal in size. Pericardium: There is no evidence of pericardial effusion. Mitral Valve: The mitral valve is normal in structure. There is mild calcification of the mitral valve leaflet(s). Moderate mitral annular calcification. Mild mitral valve regurgitation. No evidence of mitral valve stenosis. Tricuspid Valve: The tricuspid valve is normal in structure. Tricuspid valve regurgitation is mild . No evidence of tricuspid stenosis. The aortic valve is calcified. Aortic valve regurgitation is not visualized. Mild to moderate aortic stenosis is present. Pulmonic Valve: The pulmonic valve was normal in structure. Pulmonic valve regurgitation is not visualized. No evidence of pulmonic stenosis. Aorta: The aortic root is normal in size and structure. There is moderate dilatation of the ascending aorta, measuring 46 mm. Venous: The inferior vena cava is normal in size with greater than 50% respiratory variability, suggesting right atrial pressure of 3 mmHg. IAS/Shunts: No atrial level shunt detected by color flow Doppler. Additional Comments: 3D was performed not requiring image post processing on an independent workstation and was indeterminate.  LEFT VENTRICLE PLAX 2D  LVIDd:         4.50 cm   Diastology LVIDs:         2.30 cm   LV e' medial:    4.90 cm/s LV PW:         0.80 cm   LV E/e' medial:  16.0 LV IVS:        0.90 cm   LV e' lateral:   5.87 cm/s LVOT diam:     2.30 cm   LV E/e' lateral: 13.3 LV SV:         66 LV SV Index:   39 LVOT Area:     4.15 cm  RIGHT VENTRICLE RV Basal diam:  3.30 cm RV S prime:     12.97 cm/s TAPSE (M-mode): 2.4 cm LEFT ATRIUM           Index        RIGHT ATRIUM          Index LA diam:      4.30 cm 2.55 cm/m   RA Area:     9.52 cm LA Vol (A2C): 36.3 ml 21.56 ml/m  RA Volume:   17.40 ml 10.33 ml/m LA Vol (A4C): 65.5 ml 38.90  ml/m  AORTIC VALVE AV Area (Vmax):    1.41 cm AV Area (Vmean):   1.54 cm AV Area (VTI):     1.54 cm AV Vmax:           250.75 cm/s AV Vmean:          174.250 cm/s AV VTI:            0.432 m AV Peak Grad:      25.2 mmHg AV Mean Grad:      14.0 mmHg LVOT Vmax:         85.23 cm/s LVOT Vmean:        64.500 cm/s LVOT VTI:          0.160 m LVOT/AV VTI ratio: 0.37  AORTA Ao Root diam: 4.00 cm Ao Asc diam:  4.60 cm MITRAL VALVE                TRICUSPID VALVE MV Area (PHT): 3.30 cm     TR Peak grad:   28.5 mmHg MV Decel Time: 230 msec     TR Vmax:        267.00 cm/s MV E velocity: 78.25 cm/s MV A velocity: 135.50 cm/s  SHUNTS MV E/A ratio:  0.58         Systemic VTI:  0.16 m                             Systemic Diam: 2.30 cm Evalene Lunger MD Electronically signed by Evalene Lunger MD Signature Date/Time: 06/02/2024/1:00:41 PM    Final     Medications: I have reviewed the patient's current medications. Scheduled:  clobetasol  ointment  1 Application Topical BID   diphenhydrAMINE -zinc  acetate   Topical BID   heparin   5,000 Units Subcutaneous Q8H   hydrocortisone  sod succinate (SOLU-CORTEF ) inj  50 mg Intravenous Q6H   insulin  aspart  0-6 Units Subcutaneous Q4H   mupirocin  ointment  1 Application Nasal BID    Assessment/Plan: 89 y.o. male with a history of stroke (residual right sided weakness) CAD, GIB, HLD, HTN who  was admitted on 12/8 with sepsis/PNA.  Altered at that time and has remained altered.  Code stroke called on yesterday.  Head CT at that time personally reviewed and was unchanged from previous CT.  Patient not eating and prior to being sick expressed his wish to not be fed artificially.  Had extended conversation with niece today about decisions going forward and the timing of comfort care.    EEG  pending  Recommendations: Agree with current management while family makes decisions concerning comfort care.     LOS: 4 days   Sonny Hock, MD Neurology  06/03/2024  1:51 PM

## 2024-06-03 NOTE — Procedures (Signed)
 Patient Name: Nicholas Gross  MRN: [id number]  Epilepsy Attending: Arlin MALVA Krebs  Referring Physician/Provider: Germaine Raring, MD  Date: 06/03/2024 Duration: 40.29 mins  Patient history: 89 year old male with altered mental status.  EEG to look for seizure.  Level of alertness: Awake  AEDs during EEG study:  None  Technical aspects: This EEG study was done with scalp electrodes positioned according to the 10-20 International system of electrode placement. Electrical activity was reviewed with band pass filter of 1-70Hz , sensitivity of 7 uV/mm, display speed of 59mm/sec with a 60Hz  notched filter applied as appropriate. EEG data were recorded continuously and digitally stored.  Video monitoring was available and reviewed as appropriate.  Description: EEG showed continuous generalized 5 to 7 Hz theta slowing admixed with intermittent 2 to 3 Hz delta slowing.  Hyperventilation and photic stimulation were not performed.     Of note, study was technically difficult due to significant myogenic artifact.  ABNORMALITY - Continuous slow, generalized  IMPRESSION: This technically difficult study suggestive of diffuse cerebral dysfunction (encephalopathy).  No seizures or epileptiform discharges were seen throughout the recording.  Ralene Gasparyan O Torrence Hammack

## 2024-06-04 DIAGNOSIS — Z515 Encounter for palliative care: Secondary | ICD-10-CM

## 2024-06-04 DIAGNOSIS — G9341 Metabolic encephalopathy: Secondary | ICD-10-CM | POA: Diagnosis not present

## 2024-06-04 LAB — CULTURE, BLOOD (ROUTINE X 2): Culture: NO GROWTH

## 2024-06-04 MED ORDER — HYDROMORPHONE HCL 1 MG/ML IJ SOLN
0.5000 mg | INTRAMUSCULAR | Status: DC | PRN
  Administered 2024-06-04 – 2024-06-06 (×8): 1 mg via INTRAVENOUS
  Administered 2024-06-06: 19:00:00 2 mg via INTRAVENOUS
  Administered 2024-06-06: 12:00:00 0.5 mg via INTRAVENOUS
  Administered 2024-06-06 (×3): 1 mg via INTRAVENOUS
  Administered 2024-06-06: 17:00:00 2 mg via INTRAVENOUS
  Administered 2024-06-06: 21:00:00 1 mg via INTRAVENOUS
  Filled 2024-06-04 (×4): qty 1
  Filled 2024-06-04: qty 2
  Filled 2024-06-04: qty 1
  Filled 2024-06-04: qty 2
  Filled 2024-06-04 (×8): qty 1

## 2024-06-04 NOTE — Progress Notes (Signed)
 Palliative Care Progress Note, Assessment & Plan   Patient Name: Nicholas Gross       Date: 06/04/2024 DOB: [birth year]  Age: 89 y.o. MRN#: [id number] Attending Physician: Marsa Edelman, DO Primary Care Physician: Vicci [name], DO Admit Date: 05/30/2024  Subjective: Unable to assess  HPI: 89 y.o. male  with past medical history of  CAD, HTN, CVA (right side residual), dyslipidemia, former smoker, mesenteric mass, admitted on 05/30/2024 from [facility] with increased work of breathing and shortness of breath.   Family endorses patient was having symptoms of shortness of breath requiring increased oxygen approximately 1 week prior to patient.  However, a few days leading to admission breathing worsened.   On presentation to the ED, patient was noted to have increased work of breathing requiring oxygen support via nasal cannula.  Blood work revealed AKI with creatinine of 2.7 and hemoglobin of 6 7.  Patient's liver functions test were elevated-ALT 344 and AST-167.  Chest x-ray revealed right lower lobe pneumonia.  COVID, influenza and RSV PCR were negative.  Blood cultures were drawn and patient was given IV antibiotics (ceftriaxone  and azithromycin ) and IVF.   Patient is currently being treated for multifocal pneumonia, acute respiratory failure, AKI, hypotension, tachycardia, hyponatremia (sodium of 149), and acute metabolic encephalopathy.   SLP was consulted and patient unable to pass swallow eval.  N.p.o. with monitored ice chips is current recommendation.   Palliative team was consulted to assist with goals of care discussions.    Summary of counseling/coordination of care: Extensive chart review completed prior to meeting patient including labs, vital signs, imaging,  progress notes, orders, and available advanced directive documents from current and previous encounters.   After reviewing the patient's chart and assessing the patient at bedside, I spoke with patient's wife and niece in regards to symptom management and goals of care.   Ill-appearing, elderly gentleman resting in bed with family at bedside. He is not aware of my presence and does not respond to stimuli. He is tachypneic and showing signs of agitation with increase in tremors and occasional myoclonus. Pt has intermittent coughing and throat clearing. He is in no distress.   [name], niece, shares concern with increasing tremors and muscle jerks as well as increase in breathing that her uncle is suffering. Acknowledged her concern and advised with change in medication, symptom should be more controlled. She shares concern that at this stage, he may not be able to tolerate transfer to [facility] hospice home.   Discussed plan of care with Dr. Marsa and primary RN. Medication adjustment to discontinue morphine  and start dilaudid  for dyspnea and pain. Alex, RN, will give Robinul  for secretion management and haldol  for agitation.   Therapeutic silence and active listening provided for family to share their thoughts and emotions regarding current medical situation.  Emotional support provided.  Physical Exam Vitals reviewed.  Constitutional:      General: He is not in acute distress.    Appearance: He is ill-appearing.  HENT:     Head: Normocephalic and atraumatic.     Mouth/Throat:     Mouth: Mucous membranes are dry.  Pulmonary:     Effort: No respiratory distress.  Comments: Tachypnea  Skin:    General: Skin is warm and dry.    Recommendations/Plan: DNR-comfort Continue comfort care Please medicate for dyspnea, agitation and increased secretions Palliative medicine following for symptom management  [facility] liaison to assess for IPU appropriateness   Total Time 65 minutes   Discussed plan  of care with Dr. Marsa Rue, Primary RN, [place], TOC and Saddie HERO, [facility] liaison.  Time spent includes: Detailed review of medical records (labs, imaging, vital signs), medically appropriate exam (mental status, respiratory, cardiac, skin), discussed with treatment team, counseling and educating patient, family and staff, documenting clinical information, medication management and coordination of care.     Devere Sacks, ELNITA- [facility] Palliative Medicine Team  06/04/2024 8:59 AM  Office [phone]  Pager [phone]

## 2024-06-04 NOTE — Progress Notes (Signed)
 PROGRESS NOTE    Nicholas Gross   FMW:[id number] DOB: 1928/03/14  DOA: 05/30/2024 Date of Service: 06/04/2024 which is hospital day 5  PCP: Vicci [name], [facility] course / significant events:   89 year old male presenting to the hospital from his nursing facility with AMS, SOB.   HPI: Family reports that the patient had symptoms of shortness of breath and oxygen requirements around a week ago that improved a few days ago only to worsen over the past couple of days.  They report that he was noted to have increased work of breathing and increased oxygen requirements at his nursing facility prompting transfer to the emergency department.  Family reported the patient has not had similar symptoms recently and he is not oxygen dependent.  They report that he is mostly bedbound and sometimes is in a wheelchair.  He requires help with almost all of his activities of daily living.  He is able to hold a conversation but is not as verbal as he was in the past.   12/08: On presentation to the ED, acute hypoxic resp faiol, AKI Cr 2.78 and a BUN of 67. Elevated LFT, hypernatremia (149), lactic acidosis of 4.4.  CXR RLL PNA. COVID, influenza, and RSV PCR was negative.  Blood cultures were drawn. Start IV antibiotics with ceftriaxone  and azithromycin . IV fluids. Patient is DNR/DNI based on his wishes and a signed MOLST form. ICU team was consulted for consideration of admission to the ICU in case needing pressors. 12/09: Hospitalist team assumes care. Patient able to follow some simple commands like opening his mouth and wiggling his toes.  Mental status still not back to baseline.  Unable to pass swallow eval.  Will get palliative care consultation.  Viral respiratory panel positive for metapneumovirus and rhinovirus.   Continue empiric antibiotics for pneumonia. Patient with elevated creatinine 2.3 and sodium 150 12/10: Cr 2.53, Na 153, still not waking up to eat / not following commands. (+)MRSA  BCx, ID recs continue abx escalate linezolid  + ceftriaxone  + azithro + daptomycin .  12/11: more alert this afternoon, following commands, still not eating but SLP to revisit tomorrow. D/w family and palliative team today, continue current care, likely do not want to put him thorugh TEE, open to PICC and abx if his appetite is improving as long as he isn't suffering but niece notes she suspects 'his time is close' - will continue to revisit goals pending clinical progression  12/12: less alert today not following commands, appears more agitated. EEG global slowing. Still not eating. Family has made decision this afternoon for comfort measures.  12/13: hospice team to assess for inpatient       Consultants:  ICU on admission Palliative Care  Infectious disease   Procedures/Surgeries: none      ASSESSMENT & PLAN:   Comfort measures status --> Discontinue aggressive treatment(s) aimed at cure --> Palliative / comfort focused care --> meds as below for symptomatic care, frequent assessments for symptoms, maintain comfort/dignity, he is stable at this time for hospice placement if this is available  Current Facility-Administered Medications    diphenhydrAMINE  (BENADRYL ) injection 12.5 mg   hydromorphone  (DILAUDID ) injection 0.5-2 mg   camphor-menthol  (SARNA) lotion   clobetasol  ointment (TEMOVATE ) 0.05 % 1 Application   glycopyrrolate  (ROBINUL ) tablet 1 mg **OR** glycopyrrolate  (ROBINUL ) injection 0.2 mg **OR** glycopyrrolate  (ROBINUL ) injection 0.2 mg   haloperidol  (HALDOL ) tablet 0.5 mg **OR** haloperidol  (HALDOL ) 2 MG/ML solution 0.5 mg **OR** haloperidol  lactate (HALDOL ) injection  0.5 mg   loperamide  (IMODIUM ) capsule 2 mg   LORazepam  (ATIVAN ) tablet 1 mg **OR** LORazepam  (ATIVAN ) 2 MG/ML concentrated solution 1 mg **OR** LORazepam  (ATIVAN ) injection 1 mg    Hospital problems: Severe sepsis, Present on admission  End organ dysfunction: acute respiratory failure, acute kidney  injury, altered mental status, hypotension, tachycardia and tachypnea.   Multifocal pneumonia Viral + bacterial pneumonia Concern for aspiration pneumonia / pneumonitis  MRSA bacteremia Acute respiratory failure with hypoxia   AKI (acute kidney injury) Likely secondary to infection and acute kidney injury / question RTA Acute metabolic encephalopathy Not responsive to treatment of underlying cause(s) Question underlying mild cognitive impairment / vascular dementia, imaging is not diagnostic but is supportive of this ddx  CT notes diffuse cerebral parenchymal volume loss and moderate chronic ischemic disease - question vascular dementia with acute worsening now d/t metabolic insults  Advanced care planning Right arm contracted  Bullous pemphigoid Hypernatremia High anion gap metabolic acidosis on admission AG now normal but persistent Cl high and CO2 low  Elevated liver function tests on admission Thrombocytopenia Iron  deficiency anemia Borderline underweight based on BMI 19               Subjective / Brief ROS:  Patient not contributory Niece has some concerns that he seems uncomfortable today  Family Communication: discussion at bedside w/ niece Tammy and confirmed on phone w/ her later today that she has opted for comfort measures as we had discussed earlier at bedside and she had relayed to the palliative care team     Objective Findings:  Vitals:   06/03/24 0518 06/03/24 0759 06/03/24 2055 06/04/24 0900  BP: (!) 176/90 (!) 162/71 (!) 165/91 134/87  Pulse: 61 (!) 57 97 94  Resp: 16  (!) 28 (!) 25  Temp: 98 F (36.7 C)  (!) 97.3 F (36.3 C) (!) 96.3 F (35.7 C)  TempSrc:    Axillary  SpO2: 100% 99% 98% 96%  Weight:      Height:        Intake/Output Summary (Last 24 hours) at 06/04/2024 1517 Last data filed at 06/04/2024 1419 Gross per 24 hour  Intake 0 ml  Output 0 ml  Net 0 ml   Filed Weights   06/01/24 0500 06/02/24 0500 06/03/24 0500  Weight:  57.5 kg 58.9 kg 58 kg    Examination:  Physical Exam Constitutional:      General: He is not in acute distress.    Appearance: He is ill-appearing.  Cardiovascular:     Rate and Rhythm: Normal rate and regular rhythm.  Pulmonary:     Effort: No respiratory distress.     Breath sounds: Rales present.  Abdominal:     Palpations: Abdomen is soft.  Musculoskeletal:     Right lower leg: No edema.     Left lower leg: No edema.  Skin:    General: Skin is dry.  Neurological:     Mental Status: He is alert.     Comments: Awake / alert but not responding to questions and not following commands           Claudean Leavelle, DO [facility] 06/04/2024, 3:17 PM    Dictation software may have been used to generate the above note. Typos may occur and escape review in typed/dictated notes. Please contact Dr Marsa directly for clarity if needed.  Staff may message me via secure chat in Epic  but this may not receive an immediate response,  please  page me for urgent matters!  If 7PM-7AM, please contact night coverage [email or web address]

## 2024-06-04 NOTE — Progress Notes (Signed)
 Subjective: Patient seems somewhat agitated this morning.  Per notes family has made decision for comfort care.    Objective: Current vital signs: BP 134/87 (BP Location: Left Arm)   Pulse 94   Temp (!) 96.3 F (35.7 C) (Axillary)   Resp (!) 25 Comment: notified RN  Ht 5' 8 (1.727 m)   Wt 58 kg   SpO2 96%   BMI 19.44 kg/m  Vital signs in last 24 hours: Temp:  [96.3 F (35.7 C)-97.3 F (36.3 C)] 96.3 F (35.7 C) (12/13 0900) Pulse Rate:  [94-97] 94 (12/13 0900) Resp:  [25-28] 25 (12/13 0900) BP: (134-165)/(87-91) 134/87 (12/13 0900) SpO2:  [96 %-98 %] 96 % (12/13 0900)  Intake/Output from previous day: No intake/output data recorded. Intake/Output this shift: No intake/output data recorded. Nutritional status:  Diet Order             DIET DYS 3 Room service appropriate? Yes; Fluid consistency: Thin  Diet effective now                   Neurologic Exam: Mental Status: Lethargic.  Opens eyes with stimulation.  Slightly agitated.  Does not follow commands.   Cranial Nerves: Right facial droop Motor: Increased tone with right hemiparesis Sensory: Responds to light noxious stimuli bilaterally   Lab Results: Basic Metabolic Panel: Recent Labs  Lab 05/31/24 0313 06/01/24 0411 06/02/24 0356 06/03/24 0508 06/03/24 1558  NA 150* 153* 157* 161* 159*  K 3.6 3.8 3.2* 3.4* 3.7  CL 119* 125* 126* 129* 129*  CO2 15* 15* 16* 19* 18*  GLUCOSE 122* 129* 140* 141* 132*  BUN 61* 64* 69* 67* 63*  CREATININE 2.32* 2.53* 2.56* 2.52* 2.34*  CALCIUM  7.9* 8.4* 8.2* 9.1 8.8*  MG 2.2  --   --   --   --   PHOS 4.6  --   --   --   --     Liver Function Tests: Recent Labs  Lab 05/30/24 1054 05/30/24 2011 06/02/24 0356 06/03/24 0508  AST 173* 167* 44* 30  ALT 470* 344* 181* 161*  ALKPHOS 95 68 99 109  BILITOT 0.8 0.5 0.3 0.3  PROT 5.8* 4.5* 4.7* 5.4*  ALBUMIN 2.9* 2.5* 2.2* 2.8*   No results for input(s): LIPASE, AMYLASE in the last 168 hours. No results for  input(s): AMMONIA in the last 168 hours.  CBC: Recent Labs  Lab 05/30/24 1054 05/31/24 0313 06/01/24 0625 06/02/24 0356 06/03/24 0508  WBC 11.2* 7.1 8.4 7.7 13.3*  NEUTROABS 9.6*  --   --   --   --   HGB 10.4* 7.8* 8.5* 8.0* 9.1*  HCT 34.0* 24.3* 27.6* 25.6* 28.5*  MCV 110.4* 105.7* 106.6* 106.7* 105.2*  PLT 188 125* 145* 139* 136*    Cardiac Enzymes: Recent Labs  Lab 06/02/24 0356  CKTOTAL 38*    Lipid Panel: No results for input(s): CHOL, TRIG, HDL, CHOLHDL, VLDL, LDLCALC in the last 168 hours.  CBG: Recent Labs  Lab 06/02/24 2355 06/03/24 0515 06/03/24 0801 06/03/24 1153 06/03/24 1651  GLUCAP 143* 123* 110* 135* 113*    Microbiology: Results for orders placed or performed during the hospital encounter of 05/30/24  Blood Culture (routine x 2)     Status: None   Collection Time: 05/30/24 10:54 AM   Specimen: BLOOD  Result Value Ref Range Status   Specimen Description BLOOD BLOOD LEFT ARM  Final   Special Requests   Final    BOTTLES DRAWN AEROBIC AND  ANAEROBIC Blood Culture results may not be optimal due to an inadequate volume of blood received in culture bottles   Culture   Final    NO GROWTH 5 DAYS Performed at [facility], [address] [place], [place] [address]    Report Status 06/04/2024 FINAL  Final  Blood Culture (routine x 2)     Status: Abnormal (Preliminary result)   Collection Time: 05/30/24 10:54 AM   Specimen: BLOOD LEFT HAND  Result Value Ref Range Status   Specimen Description   Final    BLOOD LEFT HAND Performed at [facility], [address] [place], [place] [address]    Special Requests   Final    BOTTLES DRAWN AEROBIC AND ANAEROBIC Blood Culture results may not be optimal due to an inadequate volume of blood received in culture bottles Performed at [facility], [address] [place], [place] [address]    Culture  Setup Time   Final    GRAM POSITIVE COCCI ANAEROBIC BOTTLE  ONLY CRITICAL RESULT CALLED TO, READ BACK BY AND VERIFIED WITH: WILL ANDERSON 06/01/24 1449 KLW    Culture (A)  Final    METHICILLIN RESISTANT STAPHYLOCOCCUS AUREUS Sent to [facility] for further susceptibility testing. Performed at [facility], [address] [place], [place] [address]    Report Status PENDING  Incomplete   Organism ID, Bacteria METHICILLIN RESISTANT STAPHYLOCOCCUS AUREUS  Final      Susceptibility   Methicillin resistant staphylococcus aureus - MIC*    CIPROFLOXACIN  >=8 RESISTANT Resistant     ERYTHROMYCIN >=8 RESISTANT Resistant     GENTAMICIN <=0.5 SENSITIVE Sensitive     OXACILLIN >=4 RESISTANT Resistant     TETRACYCLINE >=16 RESISTANT Resistant     VANCOMYCIN  1 SENSITIVE Sensitive     TRIMETH /SULFA  <=10 SENSITIVE Sensitive     CLINDAMYCIN >=8 RESISTANT Resistant     RIFAMPIN <=0.5 SENSITIVE Sensitive     Inducible Clindamycin NEGATIVE Sensitive     LINEZOLID  2 SENSITIVE Sensitive     * METHICILLIN RESISTANT STAPHYLOCOCCUS AUREUS  Resp panel by RT-PCR (RSV, Flu A&B, Covid) Anterior Nasal Swab     Status: None   Collection Time: 05/30/24 10:54 AM   Specimen: Anterior Nasal Swab  Result Value Ref Range Status   SARS Coronavirus 2 by RT PCR NEGATIVE NEGATIVE Final    Comment: (NOTE) SARS-CoV-2 target nucleic acids are NOT DETECTED.  The SARS-CoV-2 RNA is generally detectable in upper respiratory specimens during the acute phase of infection. The lowest concentration of SARS-CoV-2 viral copies this assay can detect is 138 copies/mL. A negative result does not preclude SARS-Cov-2 infection and should not be used as the sole basis for treatment or other patient management decisions. A negative result may occur with  improper specimen collection/handling, submission of specimen other than nasopharyngeal swab, presence of viral mutation(s) within the areas targeted by this assay, and inadequate number of viral copies(<138 copies/mL). A negative result must  be combined with clinical observations, patient history, and epidemiological information. The expected result is Negative.  Fact Sheet for Patients:  bloggercourse.com  Fact Sheet for Healthcare Providers:  seriousbroker.it  This test is no t yet approved or cleared by the [place]  FDA and  has been authorized for detection and/or diagnosis of SARS-CoV-2 by FDA under an Emergency Use Authorization (EUA). This EUA will remain  in effect (meaning this test can be used) for the duration of the COVID-19 declaration under Section 564(b)(1) of the  Act, 21 U.S.C.section 360bbb-3(b)(1), unless the authorization is terminated  or revoked sooner.       Influenza A by PCR NEGATIVE NEGATIVE Final   Influenza B by PCR NEGATIVE NEGATIVE Final    Comment: (NOTE) The Xpert Xpress SARS-CoV-2/FLU/RSV plus assay is intended as an aid in the diagnosis of influenza from Nasopharyngeal swab specimens and should not be used as a sole basis for treatment. Nasal washings and aspirates are unacceptable for Xpert Xpress SARS-CoV-2/FLU/RSV testing.  Fact Sheet for Patients: bloggercourse.com  Fact Sheet for Healthcare Providers: seriousbroker.it  This test is not yet approved or cleared by the [place]  FDA and has been authorized for detection and/or diagnosis of SARS-CoV-2 by FDA under an Emergency Use Authorization (EUA). This EUA will remain in effect (meaning this test can be used) for the duration of the COVID-19 declaration under Section 564(b)(1) of the Act, 21 U.S.C. section 360bbb-3(b)(1), unless the authorization is terminated or revoked.     Resp Syncytial Virus by PCR NEGATIVE NEGATIVE Final    Comment: (NOTE) Fact Sheet for Patients: bloggercourse.com  Fact Sheet for Healthcare Providers: seriousbroker.it  This test is not  yet approved or cleared by the [place]  FDA and has been authorized for detection and/or diagnosis of SARS-CoV-2 by FDA under an Emergency Use Authorization (EUA). This EUA will remain in effect (meaning this test can be used) for the duration of the COVID-19 declaration under Section 564(b)(1) of the Act, 21 U.S.C. section 360bbb-3(b)(1), unless the authorization is terminated or revoked.  Performed at [facility], [address] [place], [place] [address]   Blood Culture ID Panel (Reflexed)     Status: Abnormal   Collection Time: 05/30/24 10:54 AM  Result Value Ref Range Status   Enterococcus faecalis NOT DETECTED NOT DETECTED Final   Enterococcus Faecium NOT DETECTED NOT DETECTED Final   Listeria monocytogenes NOT DETECTED NOT DETECTED Final   Staphylococcus species DETECTED (A) NOT DETECTED Final    Comment: CRITICAL RESULT CALLED TO, READ BACK BY AND VERIFIED WITH: WILL ANDERSON 06/01/24 1449 KLW    Staphylococcus aureus (BCID) DETECTED (A) NOT DETECTED Final    Comment: Methicillin (oxacillin)-resistant Staphylococcus aureus (MRSA). MRSA is predictably resistant to beta-lactam antibiotics (except ceftaroline). Preferred therapy is vancomycin  unless clinically contraindicated. Patient requires contact precautions if  hospitalized. CRITICAL RESULT CALLED TO, READ BACK BY AND VERIFIED WITH: WILL ANDERSON 06/01/24 1449 KLW    Staphylococcus epidermidis NOT DETECTED NOT DETECTED Final   Staphylococcus lugdunensis NOT DETECTED NOT DETECTED Final   Streptococcus species NOT DETECTED NOT DETECTED Final   Streptococcus agalactiae NOT DETECTED NOT DETECTED Final   Streptococcus pneumoniae NOT DETECTED NOT DETECTED Final   Streptococcus pyogenes NOT DETECTED NOT DETECTED Final   A.calcoaceticus-baumannii NOT DETECTED NOT DETECTED Final   Bacteroides fragilis NOT DETECTED NOT DETECTED Final   Enterobacterales NOT DETECTED NOT DETECTED Final   Enterobacter cloacae  complex NOT DETECTED NOT DETECTED Final   Escherichia coli NOT DETECTED NOT DETECTED Final   Klebsiella aerogenes NOT DETECTED NOT DETECTED Final   Klebsiella oxytoca NOT DETECTED NOT DETECTED Final   Klebsiella pneumoniae NOT DETECTED NOT DETECTED Final   Proteus species NOT DETECTED NOT DETECTED Final   Salmonella species NOT DETECTED NOT DETECTED Final   Serratia marcescens NOT DETECTED NOT DETECTED Final   Haemophilus influenzae NOT DETECTED NOT DETECTED Final   Neisseria meningitidis NOT DETECTED NOT DETECTED Final   Pseudomonas aeruginosa NOT DETECTED NOT DETECTED Final   Stenotrophomonas maltophilia NOT DETECTED  NOT DETECTED Final   Candida albicans NOT DETECTED NOT DETECTED Final   Candida auris NOT DETECTED NOT DETECTED Final   Candida glabrata NOT DETECTED NOT DETECTED Final   Candida krusei NOT DETECTED NOT DETECTED Final   Candida parapsilosis NOT DETECTED NOT DETECTED Final   Candida tropicalis NOT DETECTED NOT DETECTED Final   Cryptococcus neoformans/gattii NOT DETECTED NOT DETECTED Final   Meth resistant mecA/C and MREJ DETECTED (A) NOT DETECTED Final    Comment: CRITICAL RESULT CALLED TO, READ BACK BY AND VERIFIED WITH: WILL ANDERSON 06/01/24 1449 KLW Performed at [facility], [address] [place], [place] [address]   MIC (1 Drug)-     Status: Abnormal (Preliminary result)   Collection Time: 05/30/24 10:54 AM  Result Value Ref Range Status   Min Inhibitory Conc (1 Drug) Preliminary report (A)  Final    Comment: (NOTE) Performed At: [facility] [address] [place], [place] [id number] Jennette Shorter MD Ey:[id number]    Source LAB [phone] MRSA BLOOD DAPTOMYCIN   Final    Comment: Performed at [facility], [address], [place], [place] [address]  MIC Result     Status: Abnormal   Collection Time: 05/30/24 10:54 AM  Result Value Ref Range Status   Result 1 (MIC) Comment (A)  Final    Comment: (NOTE) Methicillin - resistant  Staphylococcus aureus Identification performed by account, not confirmed by this laboratory. DAPTOMYCIN  Performed At: [facility] [address] [place], [place] [id number] Jennette Shorter MD Ey:[id number]   MRSA Next Gen by PCR, Nasal     Status: Abnormal   Collection Time: 05/30/24 12:49 PM   Specimen: Nasal Mucosa; Nasal Swab  Result Value Ref Range Status   MRSA by PCR Next Gen DETECTED (A) NOT DETECTED Final    Comment: RESULT CALLED TO, READ BACK BY AND VERIFIED WITH: MYRA FLOWERS 1813 05/30/24 MU (NOTE) The GeneXpert MRSA Assay (FDA approved for NASAL specimens only), is one component of a comprehensive MRSA colonization surveillance program. It is not intended to diagnose MRSA infection nor to guide or monitor treatment for MRSA infections. Test performance is not FDA approved in patients less than 49 years old. Performed at [facility], [address] [place], [place] [address]   Respiratory (~20 pathogens) panel by PCR     Status: Abnormal   Collection Time: 05/30/24  1:55 PM   Specimen: Nasopharyngeal Swab; Respiratory  Result Value Ref Range Status   Adenovirus NOT DETECTED NOT DETECTED Final   Coronavirus 229E NOT DETECTED NOT DETECTED Final    Comment: (NOTE) The Coronavirus on the Respiratory Panel, DOES NOT test for the novel  Coronavirus (2019 nCoV)    Coronavirus HKU1 NOT DETECTED NOT DETECTED Final   Coronavirus NL63 NOT DETECTED NOT DETECTED Final   Coronavirus OC43 NOT DETECTED NOT DETECTED Final   Metapneumovirus DETECTED (A) NOT DETECTED Final   Rhinovirus / Enterovirus DETECTED (A) NOT DETECTED Final   Influenza A NOT DETECTED NOT DETECTED Final   Influenza B NOT DETECTED NOT DETECTED Final   Parainfluenza Virus 1 NOT DETECTED NOT DETECTED Final   Parainfluenza Virus 2 NOT DETECTED NOT DETECTED Final   Parainfluenza Virus 3 NOT DETECTED NOT DETECTED Final   Parainfluenza Virus 4 NOT DETECTED NOT DETECTED Final   Respiratory  Syncytial Virus NOT DETECTED NOT DETECTED Final   Bordetella pertussis NOT DETECTED NOT DETECTED Final   Bordetella Parapertussis NOT DETECTED NOT DETECTED Final   Chlamydophila pneumoniae NOT DETECTED  NOT DETECTED Final   Mycoplasma pneumoniae NOT DETECTED NOT DETECTED Final    Comment: Performed at [facility], [address] [place], [place] [address]  Culture, blood (single) w Reflex to ID Panel     Status: None (Preliminary result)   Collection Time: 06/01/24  5:20 PM   Specimen: BLOOD  Result Value Ref Range Status   Specimen Description BLOOD BLOOD RIGHT HAND  Final   Special Requests   Final    BOTTLES DRAWN AEROBIC AND ANAEROBIC Blood Culture results may not be optimal due to an inadequate volume of blood received in culture bottles   Culture   Final    NO GROWTH 3 DAYS Performed at [facility], [address] [place], [place] [address]    Report Status PENDING  Incomplete    Coagulation Studies: No results for input(s): LABPROT, INR in the last 72 hours.  Imaging: EEG adult Result Date: 06/03/2024 Shelton Arlin KIDD, MD     06/03/2024  2:17 PM Patient Name: Nicholas Gross MRN: [id number] Epilepsy Attending: Arlin KIDD Shelton Referring Physician/Provider: Germaine Raring, MD Date: 06/03/2024 Duration: 40.29 mins Patient history: 89 year old male with altered mental status.  EEG to look for seizure. Level of alertness: Awake AEDs during EEG study:  None Technical aspects: This EEG study was done with scalp electrodes positioned according to the 10-20 International system of electrode placement. Electrical activity was reviewed with band pass filter of 1-70Hz , sensitivity of 7 uV/mm, display speed of 81mm/sec with a 60Hz  notched filter applied as appropriate. EEG data were recorded continuously and digitally stored.  Video monitoring was available and reviewed as appropriate. Description: EEG showed continuous generalized 5 to 7 Hz theta slowing admixed with  intermittent 2 to 3 Hz delta slowing.  Hyperventilation and photic stimulation were not performed.   Of note, study was technically difficult due to significant myogenic artifact. ABNORMALITY - Continuous slow, generalized IMPRESSION: This technically difficult study suggestive of diffuse cerebral dysfunction (encephalopathy).  No seizures or epileptiform discharges were seen throughout the recording. Priyanka KIDD Shelton   CT HEAD WO CONTRAST ( ) Result Date: 06/02/2024 EXAM: CT HEAD WITHOUT 06/02/2024 02:53:51 PM TECHNIQUE: CT of the head was performed without the administration of intravenous contrast. Automated exposure control, iterative reconstruction, and/or weight based adjustment of the mA/kV was utilized to reduce the radiation dose to as low as reasonably achievable. COMPARISON: Head CT 05/31/2024 and MRI 09/29/2023. CLINICAL HISTORY: Mental status change, persistent or worsening. FINDINGS: BRAIN AND VENTRICLES: There is no evidence of an acute infarct, intracranial hemorrhage, mass, midline shift, hydrocephalus, or extra-axial fluid collection. There is mild to moderate cerebral atrophy. Asymmetric volume loss is again noted in the mesial left temporal lobe. Patchy cerebral white matter hypodensities are unchanged and nonspecific but compatible with mild chronic small vessel ischemic disease. Calcified atherosclerosis at the skull base. ORBITS: Bilateral cataract extraction. SINUSES AND MASTOIDS: Mild mucosal thickening in the ethmoid sinuses. Minimal bilateral mastoid fluid. SOFT TISSUES AND SKULL: No acute skull fracture. No acute soft tissue abnormality. IMPRESSION: 1. No acute intracranial abnormality. 2. Mild chronic small vessel ischemic disease. Electronically signed by: Dasie Hamburg MD 06/02/2024 04:18 PM EST RP Workstation: [id number]    Medications: I have reviewed the patient's current medications. Scheduled:  clobetasol  ointment  1 Application Topical BID    Assessment/Plan: 89 y.o.  male with a history of stroke (residual right sided weakness) CAD, GIB, HLD, HTN who was admitted on 12/8 with sepsis/PNA.  Altered at that  time and has remained altered.  Patient not eating and prior to being sick expressed his wish to not be fed artificially.  Family has made decision and has decided on comfort care.    EEG only significant for slowing.   Recommendations: Will follow prn as needed.     LOS: 5 days   Sonny Hock, MD Neurology  06/04/2024  10:48 AM

## 2024-06-04 NOTE — Plan of Care (Signed)
  Problem: Education: Goal: Knowledge of the prescribed therapeutic regimen will improve Outcome: Progressing   Problem: Coping: Goal: Ability to identify and develop effective coping behavior will improve Outcome: Progressing   Problem: Clinical Measurements: Goal: Quality of life will improve Outcome: Progressing   Problem: Respiratory: Goal: Verbalizations of increased ease of respirations will increase Outcome: Progressing

## 2024-06-04 NOTE — TOC Progression Note (Signed)
 Transition of Care [facility]) - Progression Note    Patient Details  Name: Nicholas Gross MRN: [id number] Date of Birth: 09-04-1927  [facility] CM/SW Contact  Victory Jackquline RAMAN, RN Phone Number: 06/04/2024, 12:07 PM  Clinical Narrative:    RNCM received a message from the MD asking for hospice to evaluate this patient. Saddie with [facility] made aware and said that she will evaluate the patient for IPU today. RNCM will continue to follow for any discharge needs.                      Expected Discharge Plan and Services                                               Social Drivers of Health (SDOH) Interventions SDOH Screenings   Food Insecurity: No Food Insecurity (05/30/2024)  Housing: Low Risk (06/03/2024)  Transportation Needs: No Transportation Needs (05/30/2024)  Utilities: Not At Risk (05/30/2024)  Depression (PHQ2-9): Low Risk (07/06/2023)  Recent Concern: Depression (PHQ2-9) - Medium Risk (06/01/2023)  Financial Resource Strain: Low Risk  (12/30/2023)   Received from [facility]  Social Connections: Patient Unable To Answer (05/30/2024)  Tobacco Use: Medium Risk (06/03/2024)    Readmission Risk Interventions    06/01/2024   11:41 AM  Readmission Risk Prevention Plan  Transportation Screening Complete  Medication Review (RN Care Manager) Complete  HRI or Home Care Consult Complete  SW Recovery Care/Counseling Consult Complete  Palliative Care Screening Not Applicable  Skilled Nursing Facility Complete

## 2024-06-05 DIAGNOSIS — R6520 Severe sepsis without septic shock: Secondary | ICD-10-CM | POA: Diagnosis not present

## 2024-06-05 DIAGNOSIS — A419 Sepsis, unspecified organism: Secondary | ICD-10-CM | POA: Diagnosis not present

## 2024-06-05 LAB — CULTURE, BLOOD (ROUTINE X 2): Culture  Setup Time: NO GROWTH

## 2024-06-05 NOTE — Progress Notes (Signed)
 Palliative Care Progress Note, Assessment & Plan   Patient Name: Nicholas Gross       Date: 06/05/2024 DOB: 1928-03-06  Age: 89 y.o. MRN#: [id number] Attending Physician: Marsa Edelman, DO Primary Care Physician: Vicci [name], DO Admit Date: 05/30/2024  Subjective: Unable to assess  HPI: Per previous HPI- 89 y.o. male  with past medical history of  CAD, HTN, CVA (right side residual), dyslipidemia, former smoker, mesenteric mass, admitted on 05/30/2024 from [facility] with increased work of breathing and shortness of breath.   Family endorses patient was having symptoms of shortness of breath requiring increased oxygen approximately 1 week prior to patient.  However, a few days after admission his breathing worsened.   On presentation to the ED, patient was noted to have increased work of breathing requiring oxygen support via nasal cannula.  Blood work revealed AKI with creatinine of 2.7 and hemoglobin of 6 7.  Patient's liver functions test were elevated-ALT 344 and AST-167.  Chest x-ray revealed right lower lobe pneumonia.  COVID, influenza and RSV PCR were negative.  Blood cultures were drawn and patient was given IV antibiotics (ceftriaxone  and azithromycin ) and IVF.   Patient is currently being treated for multifocal pneumonia, acute respiratory failure, AKI, hypotension, tachycardia, hyponatremia (sodium of 149), and acute metabolic encephalopathy.   SLP was consulted and patient unable to pass swallow eval.  N.p.o. with monitored ice chips is current recommendation.   Palliative team was consulted to assist with goals of care discussions.  Summary of counseling/coordination of care: Extensive chart review completed prior to meeting patient including labs, vital  signs, imaging, progress notes, orders, and available advanced directive documents from current and previous encounters.   After reviewing the patient's chart and assessing the patient at bedside, I spoke with patient's sister at bedside in regards to symptom management and goals of care.   Ill-appearing, elderly gentleman lying in bed with sister at bedside. He does not respond to verbal or tactile stimuli. He is tachypneic but calm. He is in no distress.   Sister at bedside shares that she has noticed that her brother stops breathing for a few seconds then starts back. Educated that breathing pattern is sometime seen at end of life. She verbalized understanding. Her main concern is for her brother not to suffer.   Discussed with RN via Epic chat to give IV dilaudid  for tachypnea and IV robinul  for secretion management.   Therapeutic silence and active listening provided for sister to share her thoughts and emotions regarding current medical situation.  Emotional support provided.  Spoke with Saddie BATTLE [facility] liaison) even though pt is IPU appropriate, family is concerned that he would not survive during transport. Care team agrees as well and will transfer patient to palliative care unit 1C in the hospital for EOL care.   Physical Exam Vitals reviewed.  Constitutional:      General: He is not in acute distress.    Appearance: He is ill-appearing.  HENT:     Head: Normocephalic and atraumatic.     Mouth/Throat:     Mouth: Mucous membranes are dry.  Pulmonary:     Effort: No respiratory distress.  Comments: Tachypnea  O2 for comfort via [place] Musculoskeletal:     Right lower leg: No edema.     Left lower leg: No edema.  Skin:    General: Skin is warm and dry.     Recommendations/Plan: DNR-comfort Continue comfort care Please medicate for dyspnea, agitation and increased secretions Palliative medicine following for symptom management  Pt to transfer to palliative care unit on 1C for  EOL care Anticipate hospital death    Total Time 65 minutes   Discussed plan of care with Saddie [facility] liaison), Dr. Marsa, Dasie [facility]) and Joane, Primary RN  Time spent includes: Detailed review of medical records (labs, imaging, vital signs), medically appropriate exam (mental status, respiratory, cardiac, skin), discussed with treatment team, counseling and educating patient, family and staff, documenting clinical information, medication management and coordination of care.     Devere Sacks, ELNITA- [facility] Palliative Medicine Team  06/05/2024 8:20 AM  Office [phone]  Pager [phone]

## 2024-06-05 NOTE — Progress Notes (Signed)
 Approximately 1600--Pt arrived to room 102 from [facility]. Assessment completed by this RN. Pt resting in bed with eyes closed, even respirations. Appears comfortable. No family at bedside at this time.

## 2024-06-05 NOTE — Plan of Care (Signed)

## 2024-06-05 NOTE — TOC Progression Note (Signed)
 Transition of Care [facility]) - Progression Note    Patient Details  Name: Nicholas Gross MRN: [id number] Date of Birth: 1928/05/12  Transition of Care [facility]) CM/SW Contact  Victory Jackquline RAMAN, RN Phone Number: 06/05/2024, 11:09 AM  Clinical Narrative:  RNCM received a message via secure chat from NP informing me that the patient is going to be transferring to 1C for end of life care. RNCM will continue to follow for discharge planning needs.                      Expected Discharge Plan and Services                                               Social Drivers of Health (SDOH) Interventions SDOH Screenings   Food Insecurity: No Food Insecurity (05/30/2024)  Housing: Low Risk (06/03/2024)  Transportation Needs: No Transportation Needs (05/30/2024)  Utilities: Not At Risk (05/30/2024)  Depression (PHQ2-9): Low Risk (07/06/2023)  Recent Concern: Depression (PHQ2-9) - Medium Risk (06/01/2023)  Financial Resource Strain: Low Risk  (12/30/2023)   Received from [facility]  Social Connections: Patient Unable To Answer (05/30/2024)  Tobacco Use: Medium Risk (06/03/2024)    Readmission Risk Interventions    06/01/2024   11:41 AM  Readmission Risk Prevention Plan  Transportation Screening Complete  Medication Review (RN Care Manager) Complete  HRI or Home Care Consult Complete  SW Recovery Care/Counseling Consult Complete  Palliative Care Screening Not Applicable  Skilled Nursing Facility Complete

## 2024-06-05 NOTE — Progress Notes (Signed)
 PROGRESS NOTE    Nicholas Gross   FMW:[id number] DOB: 10-19-1927  DOA: 05/30/2024 Date of Service: 06/05/2024 which is hospital day 6  PCP: Vicci [name], [facility] course / significant events:   89 year old male presenting to the hospital from his nursing facility with AMS, SOB.   HPI: Family reports that the patient had symptoms of shortness of breath and oxygen requirements around a week ago that improved a few days ago only to worsen over the past couple of days.  They report that he was noted to have increased work of breathing and increased oxygen requirements at his nursing facility prompting transfer to the emergency department.  Family reported the patient has not had similar symptoms recently and he is not oxygen dependent.  They report that he is mostly bedbound and sometimes is in a wheelchair.  He requires help with almost all of his activities of daily living.  He is able to hold a conversation but is not as verbal as he was in the past.   12/08: On presentation to the ED, acute hypoxic resp faiol, AKI Cr 2.78 and a BUN of 67. Elevated LFT, hypernatremia (149), lactic acidosis of 4.4.  CXR RLL PNA. COVID, influenza, and RSV PCR was negative.  Blood cultures were drawn. Start IV antibiotics with ceftriaxone  and azithromycin . IV fluids. Patient is DNR/DNI based on his wishes and a signed MOLST form. ICU team was consulted for consideration of admission to the ICU in case needing pressors. 12/09: Hospitalist team assumes care. Patient able to follow some simple commands like opening his mouth and wiggling his toes.  Mental status still not back to baseline.  Unable to pass swallow eval.  Will get palliative care consultation.  Viral respiratory panel positive for metapneumovirus and rhinovirus.   Continue empiric antibiotics for pneumonia. Patient with elevated creatinine 2.3 and sodium 150 12/10: Cr 2.53, Na 153, still not waking up to eat / not following commands. (+)MRSA  BCx, ID recs continue abx escalate linezolid  + ceftriaxone  + azithro + daptomycin .  12/11: more alert this afternoon, following commands, still not eating but SLP to revisit tomorrow. D/w family and palliative team today, continue current care, likely do not want to put him thorugh TEE, open to PICC and abx if his appetite is improving as long as he isn't suffering but niece notes she suspects 'his time is close' - will continue to revisit goals pending clinical progression  12/12: less alert today not following commands, appears more agitated. EEG global slowing. Still not eating. Family has made decision this afternoon for comfort measures.  12/13: hospice team to assess for inpatient  12/14: active dying process. Pt is not stable for transport, continue EOL care in the hospital.       Consultants:  ICU on admission Palliative Care  Infectious disease   Procedures/Surgeries: none      ASSESSMENT & PLAN:   Comfort measures status --> Discontinue aggressive treatment(s) aimed at cure --> Palliative / comfort focused care --> meds as below for symptomatic care, frequent assessments for symptoms, maintain comfort/dignity, he is NOT stable at this time for hospice placement outside the hospital, continue EOL care here   Current Facility-Administered Medications    diphenhydrAMINE  (BENADRYL ) injection 12.5 mg   hydromorphone  (DILAUDID ) injection 0.5-2 mg   camphor-menthol  (SARNA) lotion   clobetasol  ointment (TEMOVATE ) 0.05 % 1 Application   glycopyrrolate  (ROBINUL ) tablet 1 mg **OR** glycopyrrolate  (ROBINUL ) injection 0.2 mg **OR** glycopyrrolate  (ROBINUL ) injection  0.2 mg   haloperidol  (HALDOL ) tablet 0.5 mg **OR** haloperidol  (HALDOL ) 2 MG/ML solution 0.5 mg **OR** haloperidol  lactate (HALDOL ) injection 0.5 mg   loperamide  (IMODIUM ) capsule 2 mg   LORazepam  (ATIVAN ) tablet 1 mg **OR** LORazepam  (ATIVAN ) 2 MG/ML concentrated solution 1 mg **OR** LORazepam  (ATIVAN ) injection 1  mg    Hospital problems: Severe sepsis, Present on admission  End organ dysfunction: acute respiratory failure, acute kidney injury, altered mental status, hypotension, tachycardia and tachypnea.   Multifocal pneumonia Viral + bacterial pneumonia Concern for aspiration pneumonia / pneumonitis  MRSA bacteremia Acute respiratory failure with hypoxia   AKI (acute kidney injury) Likely secondary to infection and acute kidney injury / question RTA Acute metabolic encephalopathy Not responsive to treatment of underlying cause(s) Question underlying mild cognitive impairment / vascular dementia, imaging is not diagnostic but is supportive of this ddx  CT notes diffuse cerebral parenchymal volume loss and moderate chronic ischemic disease - question vascular dementia with acute worsening now d/t metabolic insults  Advanced care planning Right arm contracted  Bullous pemphigoid Hypernatremia High anion gap metabolic acidosis on admission AG now normal but persistent Cl high and CO2 low  Elevated liver function tests on admission Thrombocytopenia Iron  deficiency anemia Borderline underweight based on BMI 19               Subjective / Brief ROS:  Patient not contributory   Family Communication: discussion at bedside w/ sister     Objective Findings:  Vitals:   06/04/24 2026 06/05/24 0433 06/05/24 0819 06/05/24 0936  BP: 112/69 (!) 107/44 (!) 92/57   Pulse: 99 67 (!) 105   Resp: 20 15 14 20   Temp: 99.3 F (37.4 C) 98.9 F (37.2 C) (!) 100.6 F (38.1 C)   TempSrc:  Oral Axillary   SpO2: (!) 72% (!) 82% 90%   Weight:      Height:        Intake/Output Summary (Last 24 hours) at 06/05/2024 1411 Last data filed at 06/05/2024 1009 Gross per 24 hour  Intake 0 ml  Output --  Net 0 ml   Filed Weights   06/01/24 0500 06/02/24 0500 06/03/24 0500  Weight: 57.5 kg 58.9 kg 58 kg    Examination:  Physical Exam Constitutional:      General: He is not in acute  distress.    Appearance: He is ill-appearing.     Comments: Somnolent, no distress  Cardiovascular:     Rate and Rhythm: Normal rate. Rhythm irregular.  Pulmonary:     Effort: No respiratory distress (irregular breathing, slow RR but not agonal right now).     Breath sounds: Rhonchi and rales present.  Abdominal:     Palpations: Abdomen is soft.  Musculoskeletal:     Right lower leg: No edema.     Left lower leg: No edema.  Skin:    General: Skin is dry.  Neurological:     Comments: Awake / alert but not responding to questions and not following commands           Vitoria Conyer, DO [facility] 06/05/2024, 2:11 PM    Dictation software may have been used to generate the above note. Typos may occur and escape review in typed/dictated notes. Please contact Dr Marsa directly for clarity if needed.  Staff may message me via secure chat in Epic  but this may not receive an immediate response,  please page me for urgent matters!  If 7PM-7AM, please contact night coverage  [email or web address]

## 2024-06-05 NOTE — Plan of Care (Signed)

## 2024-06-06 DIAGNOSIS — A419 Sepsis, unspecified organism: Secondary | ICD-10-CM | POA: Diagnosis not present

## 2024-06-06 DIAGNOSIS — R6520 Severe sepsis without septic shock: Secondary | ICD-10-CM | POA: Diagnosis not present

## 2024-06-06 LAB — CULTURE, BLOOD (SINGLE): Culture: NO GROWTH

## 2024-06-06 NOTE — Plan of Care (Signed)
°  Problem: Education: Goal: Ability to describe self-care measures that may prevent or decrease complications (Diabetes Survival Skills Education) will improve Outcome: Progressing Goal: Individualized Educational Video(s) Outcome: Progressing   Problem: Coping: Goal: Ability to adjust to condition or change in health will improve Outcome: Progressing   Problem: Fluid Volume: Goal: Ability to maintain a balanced intake and output will improve Outcome: Progressing   Problem: Health Behavior/Discharge Planning: Goal: Ability to identify and utilize available resources and services will improve Outcome: Progressing Goal: Ability to manage health-related needs will improve Outcome: Progressing   Problem: Metabolic: Goal: Ability to maintain appropriate glucose levels will improve Outcome: Progressing   Problem: Nutritional: Goal: Maintenance of adequate nutrition will improve Outcome: Progressing   Problem: Nutritional: Goal: Progress toward achieving an optimal weight will improve Outcome: Progressing   Problem: Skin Integrity: Goal: Risk for impaired skin integrity will decrease Outcome: Progressing   Problem: Tissue Perfusion: Goal: Adequacy of tissue perfusion will improve Outcome: Progressing   Problem: Education: Goal: Knowledge of General Education information will improve Description: Including pain rating scale, medication(s)/side effects and non-pharmacologic comfort measures Outcome: Progressing   Problem: Health Behavior/Discharge Planning: Goal: Ability to manage health-related needs will improve Outcome: Progressing   Problem: Clinical Measurements: Goal: Ability to maintain clinical measurements within normal limits will improve Outcome: Progressing Goal: Will remain free from infection Outcome: Progressing Goal: Diagnostic test results will improve Outcome: Progressing Goal: Respiratory complications will improve Outcome: Progressing Goal:  Cardiovascular complication will be avoided Outcome: Progressing   Problem: Activity: Goal: Risk for activity intolerance will decrease Outcome: Progressing   Problem: Nutrition: Goal: Adequate nutrition will be maintained Outcome: Progressing   Problem: Coping: Goal: Level of anxiety will decrease Outcome: Progressing   Problem: Elimination: Goal: Will not experience complications related to bowel motility Outcome: Progressing Goal: Will not experience complications related to urinary retention Outcome: Progressing   Problem: Pain Managment: Goal: General experience of comfort will improve and/or be controlled Outcome: Progressing   Problem: Safety: Goal: Ability to remain free from injury will improve Outcome: Progressing   Problem: Skin Integrity: Goal: Risk for impaired skin integrity will decrease Outcome: Progressing   Problem: Education: Goal: Knowledge of the prescribed therapeutic regimen will improve Outcome: Progressing   Problem: Clinical Measurements: Goal: Quality of life will improve Outcome: Progressing   Problem: Respiratory: Goal: Verbalizations of increased ease of respirations will increase Outcome: Progressing   Problem: Role Relationship: Goal: Family's ability to cope with current situation will improve Outcome: Progressing Goal: Ability to verbalize concerns, feelings, and thoughts to partner or family member will improve Outcome: Progressing   Problem: Pain Management: Goal: Satisfaction with pain management regimen will improve Outcome: Progressing

## 2024-06-06 NOTE — Progress Notes (Signed)
 PROGRESS NOTE    Nicholas Gross   FMW:[id number] DOB: [birth year]  DOA: 05/30/2024 Date of Service: June 10, 2024 which is hospital day 7  PCP: Vicci [name], [facility] course / significant events:   89 year old male presenting to the hospital from his nursing facility with AMS, SOB.   HPI: Family reports that the patient had symptoms of shortness of breath and oxygen requirements around a week ago that improved a few days ago only to worsen over the past couple of days.  They report that he was noted to have increased work of breathing and increased oxygen requirements at his nursing facility prompting transfer to the emergency department.  Family reported the patient has not had similar symptoms recently and he is not oxygen dependent.  They report that he is mostly bedbound and sometimes is in a wheelchair.  He requires help with almost all of his activities of daily living.  He is able to hold a conversation but is not as verbal as he was in the past.   12/08: On presentation to the ED, acute hypoxic resp faiol, AKI Cr 2.78 and a BUN of 67. Elevated LFT, hypernatremia (149), lactic acidosis of 4.4.  CXR RLL PNA. COVID, influenza, and RSV PCR was negative.  Blood cultures were drawn. Start IV antibiotics with ceftriaxone  and azithromycin . IV fluids. Patient is DNR/DNI based on his wishes and a signed MOLST form. ICU team was consulted for consideration of admission to the ICU in case needing pressors. 12/09: Hospitalist team assumes care. Patient able to follow some simple commands like opening his mouth and wiggling his toes.  Mental status still not back to baseline.  Unable to pass swallow eval.  Will get palliative care consultation.  Viral respiratory panel positive for metapneumovirus and rhinovirus.   Continue empiric antibiotics for pneumonia. Patient with elevated creatinine 2.3 and sodium 150 12/10: Cr 2.53, Na 153, still not waking up to eat / not following commands. (+)MRSA  BCx, ID recs continue abx escalate linezolid  + ceftriaxone  + azithro + daptomycin .  12/11: more alert this afternoon, following commands, still not eating but SLP to revisit tomorrow. D/w family and palliative team today, continue current care, likely do not want to put him thorugh TEE, open to PICC and abx if his appetite is improving as long as he isn't suffering but niece notes she suspects 'his time is close' - will continue to revisit goals pending clinical progression  12/12: less alert today not following commands, appears more agitated. EEG global slowing. Still not eating. Family has made decision this afternoon for comfort measures.  12/13: hospice team to assess for inpatient  12/14-12/15: active dying process. Pt is not stable for transport, continue EOL care in the hospital.       Consultants:  ICU on admission Palliative Care  Infectious disease   Procedures/Surgeries: none      ASSESSMENT & PLAN:   Comfort measures status --> Discontinue aggressive treatment(s) aimed at cure --> Palliative / comfort focused care --> meds as below for symptomatic care, frequent assessments for symptoms, maintain comfort/dignity, he is NOT stable at this time for hospice placement outside the hospital, continue EOL care here   Current medications:  camphor-menthol , diphenhydrAMINE , glycopyrrolate  **OR** glycopyrrolate  **OR** glycopyrrolate , haloperidol  **OR** haloperidol  **OR** haloperidol  lactate, HYDROmorphone  (DILAUDID ) injection, loperamide , LORazepam  **OR** LORazepam  **OR** LORazepam   clobetasol  ointment  1 Application Topical BID      Hospital problems: Severe sepsis, Present on admission  End organ dysfunction:  acute respiratory failure, acute kidney injury, altered mental status, hypotension, tachycardia and tachypnea.   Multifocal pneumonia Viral + bacterial pneumonia Concern for aspiration pneumonia / pneumonitis  MRSA bacteremia Acute respiratory failure with hypoxia    AKI (acute kidney injury) Likely secondary to infection and acute kidney injury / question RTA Acute metabolic encephalopathy Not responsive to treatment of underlying cause(s) Question underlying mild cognitive impairment / vascular dementia, imaging is not diagnostic but is supportive of this ddx  CT notes diffuse cerebral parenchymal volume loss and moderate chronic ischemic disease - question vascular dementia with acute worsening now d/t metabolic insults  Advanced care planning Right arm contracted  Bullous pemphigoid Hypernatremia High anion gap metabolic acidosis on admission AG now normal but persistent Cl high and CO2 low  Elevated liver function tests on admission Thrombocytopenia Iron  deficiency anemia Borderline underweight based on BMI 19               Subjective / Brief ROS:  Patient not contributory   Family Communication: discussion at bedside w/ niece      Objective Findings:  Vitals:   06/05/24 1523 06/05/24 2213 06-16-24 0727 2024-06-16 1246  BP:  98/65 (!) 78/55   Pulse:  (!) 118 (!) 109   Resp:  (!) 22 15 (!) 28  Temp: 99.7 F (37.6 C) 98.9 F (37.2 C) (!) 97.5 F (36.4 C)   TempSrc: Axillary Axillary Oral   SpO2:   94%   Weight:      Height:        Intake/Output Summary (Last 24 hours) at June 16, 2024 1421 Last data filed at 06/05/2024 1630 Gross per 24 hour  Intake 0 ml  Output 0 ml  Net 0 ml   Filed Weights   06/01/24 0500 06/02/24 0500 06/03/24 0500  Weight: 57.5 kg 58.9 kg 58 kg    Examination:  Physical Exam Constitutional:      General: He is not in acute distress.    Appearance: He is ill-appearing.     Comments: Somnolent, no distress  Cardiovascular:     Rate and Rhythm: Regular rhythm. Tachycardia present.  Pulmonary:     Effort: No respiratory distress (irregular breathing, slow RR but not agonal right now).     Breath sounds: Rhonchi and rales present.  Abdominal:     Palpations: Abdomen is soft.   Musculoskeletal:     Right lower leg: No edema.     Left lower leg: No edema.  Skin:    General: Skin is dry.  Neurological:     Comments: Awake / alert but not responding to questions and not following commands           Shirly Bartosiewicz, DO [facility] 06/16/2024, 2:21 PM    Dictation software may have been used to generate the above note. Typos may occur and escape review in typed/dictated notes. Please contact Dr Marsa directly for clarity if needed.  Staff may message me via secure chat in Epic  but this may not receive an immediate response,  please page me for urgent matters!  If 7PM-7AM, please contact night coverage [email or web address]

## 2024-06-06 NOTE — Plan of Care (Signed)
  Problem: Education: Goal: Ability to describe self-care measures that may prevent or decrease complications (Diabetes Survival Skills Education) will improve Outcome: Progressing   Problem: Coping: Goal: Ability to adjust to condition or change in health will improve Outcome: Progressing   Problem: Fluid Volume: Goal: Ability to maintain a balanced intake and output will improve Outcome: Progressing   Problem: Skin Integrity: Goal: Risk for impaired skin integrity will decrease Outcome: Progressing   Problem: Tissue Perfusion: Goal: Adequacy of tissue perfusion will improve Outcome: Progressing   Problem: Clinical Measurements: Goal: Ability to maintain clinical measurements within normal limits will improve Outcome: Progressing Goal: Will remain free from infection Outcome: Progressing Goal: Diagnostic test results will improve Outcome: Progressing Goal: Respiratory complications will improve Outcome: Progressing Goal: Cardiovascular complication will be avoided Outcome: Progressing

## 2024-06-06 NOTE — Progress Notes (Signed)
 Palliative Care Progress Note, Assessment & Plan   Patient Name: Nicholas Gross       Date: 06-13-2024 DOB: Jun 25, 1927  Age: 89 y.o. MRN#: [id number] Attending Physician: Marsa Edelman, DO Primary Care Physician: Vicci [name], DO Admit Date: 05/30/2024  Subjective: Unable to assess  HPI: Per previous HPI- 89 y.o. male  with past medical history of  CAD, HTN, CVA (right side residual), dyslipidemia, former smoker, mesenteric mass, admitted on 05/30/2024 from [facility] with increased work of breathing and shortness of breath.   Family endorses patient was having symptoms of shortness of breath requiring increased oxygen approximately 1 week prior to patient.  However, a few days after admission his breathing worsened.   On presentation to the ED, patient was noted to have increased work of breathing requiring oxygen support via nasal cannula.  Blood work revealed AKI with creatinine of 2.7 and hemoglobin of 6 7.  Patient's liver functions test were elevated-ALT 344 and AST-167.  Chest x-ray revealed right lower lobe pneumonia.  COVID, influenza and RSV PCR were negative.  Blood cultures were drawn and patient was given IV antibiotics (ceftriaxone  and azithromycin ) and IVF.   Patient is currently being treated for multifocal pneumonia, acute respiratory failure, AKI, hypotension, tachycardia, hyponatremia (sodium of 149), and acute metabolic encephalopathy.   SLP was consulted and patient unable to pass swallow eval.  N.p.o. with monitored ice chips is current recommendation.   Palliative team was consulted to assist with goals of care discussions.  Summary of counseling/coordination of care: Extensive chart review completed prior to meeting patient including labs, vital  signs, imaging, progress notes, orders, and available advanced directive documents from current and previous encounters.   After reviewing the patient's chart and assessing the patient at bedside, I spoke with patient's niece in regards to symptom management and goals of care.   Ill-appearing, elderly male lying in bed with [name], niece, at bedside. He does not acknowledge my presence. Fixed, upward gaze noted today. Tachypnea noted. He is in no distress. RN at bedside medicating for tachypnea and secretions.   [name] shares that she wants patient to remain in the hospital at this time. She feels that being handled and moved around, especially with the extreme cold would be too much. She is fearful he would not survive transport and does not want him to pass during transport.   [name] reports her uncle appears to be holding his breath at time. Discussed at the end of life, this breathing pattern is expected. She verbalizes understanding.   Therapeutic silence and active listening provided for niece to share her thoughts and emotions regarding current medical situation.  Emotional support provided.  Physical Exam Constitutional:      General: He is not in acute distress.    Appearance: He is ill-appearing.  HENT:     Head: Normocephalic and atraumatic.  Pulmonary:     Effort: No respiratory distress.     Comments: Tachypnea  Musculoskeletal:     Right lower leg: No edema.     Left lower leg: No edema.  Skin:    General: Skin is warm and dry.   Recommendations/Plan: DNR-comfort Continue comfort care Please  medicate for dyspnea, agitation and increased secretions Palliative medicine following for symptom management  Anticipate hospital death              Total Time 50 minutes   Time spent includes: Detailed review of medical records (labs, imaging, vital signs), medically appropriate exam (mental status, respiratory, cardiac, skin), discussed with treatment team, counseling and  educating patient, family and staff, documenting clinical information, medication management and coordination of care.  Discussed plan of care with primary RN.    Devere Sacks, ELNITA- [facility] Palliative Medicine Team  06-Jul-2024 11:22 AM  Office [phone]  Pager [phone]

## 2024-06-06 NOTE — Progress Notes (Signed)
 [facility] Liaison Note  Hospital Liaison team will follow peripherally at this time.    Please call with any hospice related questions or concerns.  Thank you for the opportunity to participate in this patient's care  [facility] Liasion [phone]

## 2024-06-07 DIAGNOSIS — A419 Sepsis, unspecified organism: Secondary | ICD-10-CM | POA: Diagnosis not present

## 2024-06-07 DIAGNOSIS — R6520 Severe sepsis without septic shock: Secondary | ICD-10-CM | POA: Diagnosis not present

## 2024-06-07 LAB — MINIMUM INHIBITORY CONC. (1 DRUG)

## 2024-06-07 LAB — MIC RESULT

## 2024-06-08 ENCOUNTER — Encounter: Payer: Self-pay | Admitting: Osteopathic Medicine

## 2024-06-23 NOTE — Death Summary Note (Signed)
 DEATH SUMMARY   Patient Details  Name: Nicholas Gross MRN: [id number] DOB: 1927-08-10 ERE:Gnywdnw, Nicholas SQUIBB, DO Admission/Discharge Information   Admit Date:  Jun 19, 2024  Date of Death: Date of Death: 06-26-24  Time of Death: Time of Death: 07-02-2233  Length of Stay: 8   Principle Cause of death: Septic shock due to bacteremia, with underlying multifocal bacterial and viral pneumonia  Hospital Diagnoses: Principal Problem:   Severe sepsis (HCC) Active Problems:   Multifocal pneumonia   Acute respiratory failure with hypoxia (HCC)   AKI (acute kidney injury)   Acute metabolic encephalopathy   Bullous pemphigoid (HCC)   Hypernatremia   High anion gap metabolic acidosis   Elevated liver function tests   Itching   Iron  deficiency anemia   Thrombocytopenia   Protein-calorie malnutrition, severe   Seizure (HCC)   Palliative care by specialist   Comfort measures only status   Hospital course / significant events:   89 year old male presenting to the hospital from his nursing facility with AMS, SOB.   HPI: Family reports that the patient had symptoms of shortness of breath and oxygen requirements around a week ago that improved a few days ago only to worsen over the past couple of days.  They report that he was noted to have increased work of breathing and increased oxygen requirements at his nursing facility prompting transfer to the emergency department.  Family reported the patient has not had similar symptoms recently and he is not oxygen dependent.  They report that he is mostly bedbound and sometimes is in a wheelchair.  He requires help with almost all of his activities of daily living.  He is able to hold a conversation but is not as verbal as he was in the past.   06-19-24: On presentation to the ED, acute hypoxic resp faiol, AKI Cr 2.78 and a BUN of 67. Elevated LFT, hypernatremia (149), lactic acidosis of 4.4.  CXR RLL PNA. COVID, influenza, and RSV PCR was negative.  Blood  cultures were drawn. Start IV antibiotics with ceftriaxone  and azithromycin . IV fluids. Patient is DNR/DNI based on his wishes and a signed MOLST form. ICU team was consulted for consideration of admission to the ICU in case needing pressors. 12/09: Hospitalist team assumes care. Patient able to follow some simple commands like opening his mouth and wiggling his toes.  Mental status still not back to baseline.  Unable to pass swallow eval.  Will get palliative care consultation.  Viral respiratory panel positive for metapneumovirus and rhinovirus.   Continue empiric antibiotics for pneumonia. Patient with elevated creatinine 2.3 and sodium 150 12/10: Cr 2.53, Na 153, still not waking up to eat / not following commands. (+)MRSA BCx, ID recs continue abx escalate linezolid  + ceftriaxone  + azithro + daptomycin .  12/11: more alert this afternoon, following commands, still not eating but SLP to revisit tomorrow. D/w family and palliative team today, continue current care, likely do not want to put him thorugh TEE, open to PICC and abx if his appetite is improving as long as he isn't suffering but niece notes she suspects 'his time is close' - will continue to revisit goals pending clinical progression  12/12: less alert today not following commands, appears more agitated. EEG global slowing. Still not eating. Family has made decision this afternoon for comfort measures.  12/13: hospice team to assess for inpatient  12/14-12/15: active dying process. Pt is not stable for transport, continue EOL care in the hospital.  Consultants:  ICU on admission Palliative Care  Infectious disease   Procedures/Surgeries: none      ASSESSMENT & PLAN:   Comfort measures status --> Discontinue aggressive treatment(s) aimed at cure --> Palliative / comfort focused care --> meds as below for symptomatic care, frequent assessments for symptoms, maintain comfort/dignity, he is NOT stable at this time for  hospice placement outside the hospital, continue EOL care here   Current medications:  camphor-menthol , diphenhydrAMINE , glycopyrrolate  **OR** glycopyrrolate  **OR** glycopyrrolate , haloperidol  **OR** haloperidol  **OR** haloperidol  lactate, HYDROmorphone  (DILAUDID ) injection, loperamide , LORazepam  **OR** LORazepam  **OR** LORazepam   clobetasol  ointment  1 Application Topical BID      Hospital problems: Severe sepsis, Present on admission  End organ dysfunction: acute respiratory failure, acute kidney injury, altered mental status, hypotension, tachycardia and tachypnea.   Multifocal pneumonia Viral + bacterial pneumonia Concern for aspiration pneumonia / pneumonitis  MRSA bacteremia Acute respiratory failure with hypoxia   AKI (acute kidney injury) Likely secondary to infection and acute kidney injury / question RTA Acute metabolic encephalopathy Not responsive to treatment of underlying cause(s) Question underlying mild cognitive impairment / vascular dementia, imaging is not diagnostic but is supportive of this ddx  CT notes diffuse cerebral parenchymal volume loss and moderate chronic ischemic disease - question vascular dementia with acute worsening now d/t metabolic insults  Advanced care planning Right arm contracted  Bullous pemphigoid Hypernatremia High anion gap metabolic acidosis on admission AG now normal but persistent Cl high and CO2 low  Elevated liver function tests on admission Thrombocytopenia Iron  deficiency anemia Borderline underweight based on BMI 19           The results of significant diagnostics from this hospitalization (including imaging, microbiology, ancillary and laboratory) are listed below for reference.   Significant Diagnostic Studies: EEG adult Result Date: 06/03/2024 Shelton Arlin KIDD, MD     06/03/2024  2:17 PM Patient Name: Nicholas Gross MRN: [id number] Epilepsy Attending: Arlin KIDD Shelton Referring Physician/Provider: Germaine Raring, MD Date: 06/03/2024 Duration: 40.29 mins Patient history: 89 year old male with altered mental status.  EEG to look for seizure. Level of alertness: Awake AEDs during EEG study:  None Technical aspects: This EEG study was done with scalp electrodes positioned according to the 10-20 International system of electrode placement. Electrical activity was reviewed with band pass filter of 1-70Hz , sensitivity of 7 uV/mm, display speed of 40mm/sec with a 60Hz  notched filter applied as appropriate. EEG data were recorded continuously and digitally stored.  Video monitoring was available and reviewed as appropriate. Description: EEG showed continuous generalized 5 to 7 Hz theta slowing admixed with intermittent 2 to 3 Hz delta slowing.  Hyperventilation and photic stimulation were not performed.   Of note, study was technically difficult due to significant myogenic artifact. ABNORMALITY - Continuous slow, generalized IMPRESSION: This technically difficult study suggestive of diffuse cerebral dysfunction (encephalopathy).  No seizures or epileptiform discharges were seen throughout the recording. Priyanka KIDD Shelton   CT HEAD WO CONTRAST ( ) Result Date: 06/02/2024 EXAM: CT HEAD WITHOUT 06/02/2024 02:53:51 PM TECHNIQUE: CT of the head was performed without the administration of intravenous contrast. Automated exposure control, iterative reconstruction, and/or weight based adjustment of the mA/kV was utilized to reduce the radiation dose to as low as reasonably achievable. COMPARISON: Head CT 05/31/2024 and MRI 09/29/2023. CLINICAL HISTORY: Mental status change, persistent or worsening. FINDINGS: BRAIN AND VENTRICLES: There is no evidence of an acute infarct, intracranial hemorrhage, mass, midline shift, hydrocephalus, or extra-axial fluid collection. There is mild to moderate  cerebral atrophy. Asymmetric volume loss is again noted in the mesial left temporal lobe. Patchy cerebral white matter hypodensities are  unchanged and nonspecific but compatible with mild chronic small vessel ischemic disease. Calcified atherosclerosis at the skull base. ORBITS: Bilateral cataract extraction. SINUSES AND MASTOIDS: Mild mucosal thickening in the ethmoid sinuses. Minimal bilateral mastoid fluid. SOFT TISSUES AND SKULL: No acute skull fracture. No acute soft tissue abnormality. IMPRESSION: 1. No acute intracranial abnormality. 2. Mild chronic small vessel ischemic disease. Electronically signed by: Dasie Hamburg MD 06/02/2024 04:18 PM EST RP Workstation: [id number]   ECHOCARDIOGRAM COMPLETE Result Date: 06/02/2024    ECHOCARDIOGRAM REPORT   Patient Name:   Nicholas Gross Date of Exam: 06/01/2024 Medical Rec #:  [id number]         Height:       68.0 in Accession #:    [id number]        Weight:       126.8 lb Date of Birth:  1927/08/13         BSA:          1.684 m Patient Age:    89 years          BP:           112/71 mmHg Patient Gender: M                 HR:           91 bpm. Exam Location:  [facility] Procedure: 2D Echo, Cardiac Doppler and Color Doppler (Both Spectral and Color            Flow Doppler were utilized during procedure). Indications:     R78.81 Bacteremia.  History:         Patient has prior history of Echocardiogram examinations, most                  recent 12/12/2021. CAD; Risk Factors:Hypertension and                  Dyslipidemia.  Sonographer:     Carl Coma RDCS Referring Phys:  [id number] DAVID P FITZGERALD Diagnosing Phys: Evalene Lunger MD IMPRESSIONS  1. No valve vegetation noted  2. Left ventricular ejection fraction, by estimation, is 55 to 60%. The left ventricle has normal function. The left ventricle has no regional wall motion abnormalities. There is mild left ventricular hypertrophy. Left ventricular diastolic parameters are consistent with Grade I diastolic dysfunction (impaired relaxation).  3. Right ventricular systolic function is normal. The right ventricular size is normal. There is normal  pulmonary artery systolic pressure. The estimated right ventricular systolic pressure is 33.5 mmHg.  4. The mitral valve is normal in structure. Mild mitral valve regurgitation. No evidence of mitral stenosis. Moderate mitral annular calcification.  5. The aortic valve is calcified. Aortic valve regurgitation is not visualized. Mild to moderate aortic valve stenosis. Aortic valve area, by VTI measures 1.54 cm. Aortic valve mean gradient measures 14.0 mmHg. Aortic valve Vmax measures 2.51 m/s.  6. There is moderate dilatation of the ascending aorta, measuring 46 mm.  7. The inferior vena cava is normal in size with greater than 50% respiratory variability, suggesting right atrial pressure of 3 mmHg.  8. Frequent PVCs FINDINGS  Left Ventricle: Left ventricular ejection fraction, by estimation, is 55 to 60%. The left ventricle has normal function. The left ventricle has no regional wall motion abnormalities. Strain was performed and the global longitudinal strain is indeterminate. The left ventricular internal  cavity size was normal in size. There is mild left ventricular hypertrophy. Left ventricular diastolic parameters are consistent with Grade I diastolic dysfunction (impaired relaxation). Right Ventricle: The right ventricular size is normal. No increase in right ventricular wall thickness. Right ventricular systolic function is normal. There is normal pulmonary artery systolic pressure. The tricuspid regurgitant velocity is 2.67 m/s, and  with an assumed right atrial pressure of 5 mmHg, the estimated right ventricular systolic pressure is 33.5 mmHg. Left Atrium: Left atrial size was normal in size. Right Atrium: Right atrial size was normal in size. Pericardium: There is no evidence of pericardial effusion. Mitral Valve: The mitral valve is normal in structure. There is mild calcification of the mitral valve leaflet(s). Moderate mitral annular calcification. Mild mitral valve regurgitation. No evidence of mitral  valve stenosis. Tricuspid Valve: The tricuspid valve is normal in structure. Tricuspid valve regurgitation is mild . No evidence of tricuspid stenosis. The aortic valve is calcified. Aortic valve regurgitation is not visualized. Mild to moderate aortic stenosis is present. Pulmonic Valve: The pulmonic valve was normal in structure. Pulmonic valve regurgitation is not visualized. No evidence of pulmonic stenosis. Aorta: The aortic root is normal in size and structure. There is moderate dilatation of the ascending aorta, measuring 46 mm. Venous: The inferior vena cava is normal in size with greater than 50% respiratory variability, suggesting right atrial pressure of 3 mmHg. IAS/Shunts: No atrial level shunt detected by color flow Doppler. Additional Comments: 3D was performed not requiring image post processing on an independent workstation and was indeterminate.  LEFT VENTRICLE PLAX 2D LVIDd:         4.50 cm   Diastology LVIDs:         2.30 cm   LV e' medial:    4.90 cm/s LV PW:         0.80 cm   LV E/e' medial:  16.0 LV IVS:        0.90 cm   LV e' lateral:   5.87 cm/s LVOT diam:     2.30 cm   LV E/e' lateral: 13.3 LV SV:         66 LV SV Index:   39 LVOT Area:     4.15 cm  RIGHT VENTRICLE RV Basal diam:  3.30 cm RV S prime:     12.97 cm/s TAPSE (M-mode): 2.4 cm LEFT ATRIUM           Index        RIGHT ATRIUM          Index LA diam:      4.30 cm 2.55 cm/m   RA Area:     9.52 cm LA Vol (A2C): 36.3 ml 21.56 ml/m  RA Volume:   17.40 ml 10.33 ml/m LA Vol (A4C): 65.5 ml 38.90 ml/m  AORTIC VALVE AV Area (Vmax):    1.41 cm AV Area (Vmean):   1.54 cm AV Area (VTI):     1.54 cm AV Vmax:           250.75 cm/s AV Vmean:          174.250 cm/s AV VTI:            0.432 m AV Peak Grad:      25.2 mmHg AV Mean Grad:      14.0 mmHg LVOT Vmax:         85.23 cm/s LVOT Vmean:        64.500 cm/s LVOT VTI:  0.160 m LVOT/AV VTI ratio: 0.37  AORTA Ao Root diam: 4.00 cm Ao Asc diam:  4.60 cm MITRAL VALVE                 TRICUSPID VALVE MV Area (PHT): 3.30 cm     TR Peak grad:   28.5 mmHg MV Decel Time: 230 msec     TR Vmax:        267.00 cm/s MV E velocity: 78.25 cm/s MV A velocity: 135.50 cm/s  SHUNTS MV E/A ratio:  0.58         Systemic VTI:  0.16 m                             Systemic Diam: 2.30 cm Evalene Lunger MD Electronically signed by Evalene Lunger MD Signature Date/Time: 06/02/2024/1:00:41 PM    Final    CT HEAD WO CONTRAST ( ) Result Date: 05/31/2024 EXAM: CT HEAD WITHOUT 05/31/2024 11:05:29 AM TECHNIQUE: CT of the head was performed without the administration of intravenous contrast. Automated exposure control, iterative reconstruction, and/or weight based adjustment of the mA/kV was utilized to reduce the radiation dose to as low as reasonably achievable. COMPARISON: 12/06/2023 CLINICAL HISTORY: Mental status change, unknown cause FINDINGS: BRAIN AND VENTRICLES: No acute intracranial hemorrhage. No mass effect or midline shift. No extra-axial fluid collection. No evidence of acute infarct. No hydrocephalus. Proportional prominence of ventricles and sulci, consistent with diffuse cerebral parenchymal volume loss. Periventricular and subcortical white matter hypoattenuation, consistent with moderate chronic ischemic microvascular disease. Calcified atherosclerotic plaque within cavernous/supraclinoid internal carotid arteries. ORBITS: No acute abnormality. Bilateral lens replacements. SINUSES AND MASTOIDS: Mild scattered mucosal thickening in the ethmoid sinuses. Trace bilateral mastoid effusions. SOFT TISSUES AND SKULL: No acute skull fracture. No acute soft tissue abnormality. IMPRESSION: 1. No acute intracranial abnormality. 2. Diffuse cerebral parenchymal volume loss. 3. Moderate chronic ischemic microvascular disease. Electronically signed by: Donnice Mania MD 05/31/2024 10:53 PM EST RP Workstation: [id number]   CT CHEST WO CONTRAST Result Date: 05/30/2024 CLINICAL DATA:  Respiratory failure, pneumonia  EXAM: CT CHEST WITHOUT CONTRAST TECHNIQUE: Multidetector CT imaging of the chest was performed following the standard protocol without IV contrast. RADIATION DOSE REDUCTION: This exam was performed according to the departmental dose-optimization program which includes automated exposure control, adjustment of the mA and/or kV according to patient size and/or use of iterative reconstruction technique. COMPARISON:  05/30/2024, 09/28/2023 FINDINGS: Cardiovascular: Unenhanced imaging of the heart is unremarkable without pericardial effusion. There is continued dense calcification of the mitral and aortic valves. 4.6 cm ascending thoracic aortic aneurysm is noted, unchanged by my measurements since previous study. There is diffuse atherosclerosis of the aorta and coronary vasculature. Assessment of the vascular lumen cannot be performed without intravenous contrast. Mediastinum/Nodes: Borderline enlarged mediastinal and hilar lymph nodes are likely reactive. Thyroid , trachea, and esophagus are unremarkable. Lungs/Pleura: There is multifocal bilateral airspace disease, most pronounced within the right upper, right lower, and left lower lobes, consistent with bilateral bronchopneumonia. Trace right pleural effusion. No pneumothorax. Upper Abdomen: No acute abnormality. Musculoskeletal: No acute or destructive bony abnormalities. Multiple prior healed bilateral rib fractures. Chronic compression deformities at T5, T9, T10, and L1. Reconstructed images demonstrate no additional findings. IMPRESSION: 1. Multifocal bilateral bronchopneumonia as above. 2. Trace right parapneumonic effusion. 3. Reactive mediastinal and bilateral hilar lymph nodes. 4. 4.6 cm ascending thoracic aortic aneurysm. Recommend semi-annual imaging followup by CTA or MRA and referral to cardiothoracic surgery if not already  obtained. This recommendation follows 2010 ACCF/AHA/AATS/ACR/ASA/SCA/SCAI/SIR/STS/SVM Guidelines for the Diagnosis and Management of  Patients With Thoracic Aortic Disease. Circulation. 2010; 121: Z733-z630. Aortic aneurysm NOS (ICD10-I71.9) 5. Aortic Atherosclerosis (ICD10-I70.0). Coronary artery atherosclerosis. Electronically Signed   By: Ozell Daring M.D.   On: 05/30/2024 16:58   DG Chest Port 1 View Result Date: 05/30/2024 EXAM: 1 VIEW(S) XRAY OF THE CHEST 05/30/2024 11:04:00 AM COMPARISON: 12/06/2023 CLINICAL HISTORY: Questionable sepsis - evaluate for abnormality FINDINGS: LIMITATIONS/ARTIFACTS: Rightward rotation of the frontal projection reducing diagnostic sensitivity/specificity. Assessment complicated by the rightward rotation. LUNGS AND PLEURA: New substantial opacity at the right lung base without obscuration of the right heart border, presumably this is a right lower lobe airspace opacity, although a diaphragmatic hernia could appear similarly. This could be further assessed with chest CT if clinically warranted. No pleural effusion. No pneumothorax. HEART AND MEDIASTINUM: Aortic atherosclerosis. No acute abnormality of the cardiac and mediastinal silhouettes. BONES AND SOFT TISSUES: No acute osseous abnormality. IMPRESSION: 1. Right lung base opacity, favored right lower lobe airspace process over new diaphragmatic hernia; evaluation limited by rightward rotation, and chest CT may be considered if warranted. 2. Aortic atherosclerosis. Electronically signed by: Ryan Salvage MD 05/30/2024 12:03 PM EST RP Workstation: [id number]    Microbiology: Recent Results (from the past 240 hours)  Blood Culture (routine x 2)     Status: None   Collection Time: 05/30/24 10:54 AM   Specimen: BLOOD  Result Value Ref Range Status   Specimen Description BLOOD BLOOD LEFT ARM  Final   Special Requests   Final    BOTTLES DRAWN AEROBIC AND ANAEROBIC Blood Culture results may not be optimal due to an inadequate volume of blood received in culture bottles   Culture   Final    NO GROWTH 5 DAYS Performed at [facility], [address] [place], [place] [address]    Report Status 06/04/2024 FINAL  Final  Blood Culture (routine x 2)     Status: Abnormal   Collection Time: 05/30/24 10:54 AM   Specimen: BLOOD LEFT HAND  Result Value Ref Range Status   Specimen Description   Final    BLOOD LEFT HAND Performed at [facility], [address], [place], [place] [address]    Special Requests   Final    BOTTLES DRAWN AEROBIC AND ANAEROBIC Blood Culture results may not be optimal due to an inadequate volume of blood received in culture bottles Performed at [facility], [address] [place], [place] [address]    Culture  Setup Time   Final    GRAM POSITIVE COCCI ANAEROBIC BOTTLE ONLY CRITICAL RESULT CALLED TO, READ BACK BY AND VERIFIED WITH: WILL ANDERSON 06/01/24 1449 KLW    Culture (A)  Final    METHICILLIN RESISTANT STAPHYLOCOCCUS AUREUS SEE SEPARATE REPORT Performed at [facility], [address], [place], [place] [address]    Report Status 06/05/2024 FINAL  Final   Organism ID, Bacteria METHICILLIN RESISTANT STAPHYLOCOCCUS AUREUS  Final      Susceptibility   Methicillin resistant staphylococcus aureus - MIC*    CIPROFLOXACIN  >=8 RESISTANT Resistant     ERYTHROMYCIN >=8 RESISTANT Resistant     GENTAMICIN <=0.5 SENSITIVE Sensitive     OXACILLIN >=4 RESISTANT Resistant     TETRACYCLINE >=16 RESISTANT Resistant     VANCOMYCIN  1 SENSITIVE Sensitive     TRIMETH /SULFA  <=10 SENSITIVE Sensitive     CLINDAMYCIN >=8 RESISTANT Resistant     RIFAMPIN <=0.5  SENSITIVE Sensitive     Inducible Clindamycin NEGATIVE Sensitive     LINEZOLID  2 SENSITIVE Sensitive     * METHICILLIN RESISTANT STAPHYLOCOCCUS AUREUS  Resp panel by RT-PCR (RSV, Flu A&B, Covid) Anterior Nasal Swab     Status: None   Collection Time: 05/30/24 10:54 AM   Specimen: Anterior Nasal Swab  Result Value Ref Range Status   SARS Coronavirus 2 by RT PCR NEGATIVE NEGATIVE Final    Comment: (NOTE) SARS-CoV-2 target nucleic  acids are NOT DETECTED.  The SARS-CoV-2 RNA is generally detectable in upper respiratory specimens during the acute phase of infection. The lowest concentration of SARS-CoV-2 viral copies this assay can detect is 138 copies/mL. A negative result does not preclude SARS-Cov-2 infection and should not be used as the sole basis for treatment or other patient management decisions. A negative result may occur with  improper specimen collection/handling, submission of specimen other than nasopharyngeal swab, presence of viral mutation(s) within the areas targeted by this assay, and inadequate number of viral copies(<138 copies/mL). A negative result must be combined with clinical observations, patient history, and epidemiological information. The expected result is Negative.  Fact Sheet for Patients:  bloggercourse.com  Fact Sheet for Healthcare Providers:  seriousbroker.it  This test is no t yet approved or cleared by the [place]  FDA and  has been authorized for detection and/or diagnosis of SARS-CoV-2 by FDA under an Emergency Use Authorization (EUA). This EUA will remain  in effect (meaning this test can be used) for the duration of the COVID-19 declaration under Section 564(b)(1) of the Act, 21 U.S.C.section 360bbb-3(b)(1), unless the authorization is terminated  or revoked sooner.       Influenza A by PCR NEGATIVE NEGATIVE Final   Influenza B by PCR NEGATIVE NEGATIVE Final    Comment: (NOTE) The Xpert Xpress SARS-CoV-2/FLU/RSV plus assay is intended as an aid in the diagnosis of influenza from Nasopharyngeal swab specimens and should not be used as a sole basis for treatment. Nasal washings and aspirates are unacceptable for Xpert Xpress SARS-CoV-2/FLU/RSV testing.  Fact Sheet for Patients: bloggercourse.com  Fact Sheet for Healthcare Providers: seriousbroker.it  This  test is not yet approved or cleared by the [place]  FDA and has been authorized for detection and/or diagnosis of SARS-CoV-2 by FDA under an Emergency Use Authorization (EUA). This EUA will remain in effect (meaning this test can be used) for the duration of the COVID-19 declaration under Section 564(b)(1) of the Act, 21 U.S.C. section 360bbb-3(b)(1), unless the authorization is terminated or revoked.     Resp Syncytial Virus by PCR NEGATIVE NEGATIVE Final    Comment: (NOTE) Fact Sheet for Patients: bloggercourse.com  Fact Sheet for Healthcare Providers: seriousbroker.it  This test is not yet approved or cleared by the [place]  FDA and has been authorized for detection and/or diagnosis of SARS-CoV-2 by FDA under an Emergency Use Authorization (EUA). This EUA will remain in effect (meaning this test can be used) for the duration of the COVID-19 declaration under Section 564(b)(1) of the Act, 21 U.S.C. section 360bbb-3(b)(1), unless the authorization is terminated or revoked.  Performed at [facility], [address] [place], [place] [address]   Blood Culture ID Panel (Reflexed)     Status: Abnormal   Collection Time: 05/30/24 10:54 AM  Result Value Ref Range Status   Enterococcus faecalis NOT DETECTED NOT DETECTED Final   Enterococcus Faecium NOT DETECTED NOT DETECTED Final   Listeria monocytogenes NOT DETECTED NOT DETECTED Final  Staphylococcus species DETECTED (A) NOT DETECTED Final    Comment: CRITICAL RESULT CALLED TO, READ BACK BY AND VERIFIED WITH: WILL ANDERSON 06/01/24 1449 KLW    Staphylococcus aureus (BCID) DETECTED (A) NOT DETECTED Final    Comment: Methicillin (oxacillin)-resistant Staphylococcus aureus (MRSA). MRSA is predictably resistant to beta-lactam antibiotics (except ceftaroline). Preferred therapy is vancomycin  unless clinically contraindicated. Patient requires contact precautions if   hospitalized. CRITICAL RESULT CALLED TO, READ BACK BY AND VERIFIED WITH: WILL ANDERSON 06/01/24 1449 KLW    Staphylococcus epidermidis NOT DETECTED NOT DETECTED Final   Staphylococcus lugdunensis NOT DETECTED NOT DETECTED Final   Streptococcus species NOT DETECTED NOT DETECTED Final   Streptococcus agalactiae NOT DETECTED NOT DETECTED Final   Streptococcus pneumoniae NOT DETECTED NOT DETECTED Final   Streptococcus pyogenes NOT DETECTED NOT DETECTED Final   A.calcoaceticus-baumannii NOT DETECTED NOT DETECTED Final   Bacteroides fragilis NOT DETECTED NOT DETECTED Final   Enterobacterales NOT DETECTED NOT DETECTED Final   Enterobacter cloacae complex NOT DETECTED NOT DETECTED Final   Escherichia coli NOT DETECTED NOT DETECTED Final   Klebsiella aerogenes NOT DETECTED NOT DETECTED Final   Klebsiella oxytoca NOT DETECTED NOT DETECTED Final   Klebsiella pneumoniae NOT DETECTED NOT DETECTED Final   Proteus species NOT DETECTED NOT DETECTED Final   Salmonella species NOT DETECTED NOT DETECTED Final   Serratia marcescens NOT DETECTED NOT DETECTED Final   Haemophilus influenzae NOT DETECTED NOT DETECTED Final   Neisseria meningitidis NOT DETECTED NOT DETECTED Final   Pseudomonas aeruginosa NOT DETECTED NOT DETECTED Final   Stenotrophomonas maltophilia NOT DETECTED NOT DETECTED Final   Candida albicans NOT DETECTED NOT DETECTED Final   Candida auris NOT DETECTED NOT DETECTED Final   Candida glabrata NOT DETECTED NOT DETECTED Final   Candida krusei NOT DETECTED NOT DETECTED Final   Candida parapsilosis NOT DETECTED NOT DETECTED Final   Candida tropicalis NOT DETECTED NOT DETECTED Final   Cryptococcus neoformans/gattii NOT DETECTED NOT DETECTED Final   Meth resistant mecA/C and MREJ DETECTED (A) NOT DETECTED Final    Comment: CRITICAL RESULT CALLED TO, READ BACK BY AND VERIFIED WITH: WILL ANDERSON 06/01/24 1449 KLW Performed at [facility], [address] [place], [place]  [address]   MIC (1 Drug)-     Status: Abnormal   Collection Time: 05/30/24 10:54 AM  Result Value Ref Range Status   Min Inhibitory Conc (1 Drug) Final report (A)  Corrected    Comment: (NOTE) Performed At: [facility] [address] [place], [place] [id number] Jennette Shorter MD Ey:[phone] CORRECTED ON 12/16 AT 1636: PREVIOUSLY REPORTED AS Preliminary report    Source LAB [address] MRSA BLOOD DAPTOMYCIN   Final    Comment: Performed at [facility], [address], [place], [place] [address]  MIC Result     Status: Abnormal   Collection Time: 05/30/24 10:54 AM  Result Value Ref Range Status   Result 1 (MIC) Comment (A)  Final    Comment: (NOTE) Methicillin - resistant Staphylococcus aureus Identification performed by account, not confirmed by this laboratory. Testing performed by broth microdilution. DAPTOMYCIN   0.5 ug/mL  SUSCEPTIBLE Performed At: [facility] [address] [place], [place] [id number] Jennette Shorter MD Ey:[phone]   MRSA Next Gen by PCR, Nasal     Status: Abnormal   Collection Time: 05/30/24 12:49 PM   Specimen: Nasal Mucosa; Nasal Swab  Result Value Ref Range Status   MRSA by PCR Next Gen DETECTED (A) NOT DETECTED Final  Comment: RESULT CALLED TO, READ BACK BY AND VERIFIED WITH: MYRA FLOWERS 1813 05/30/24 MU (NOTE) The GeneXpert MRSA Assay (FDA approved for NASAL specimens only), is one component of a comprehensive MRSA colonization surveillance program. It is not intended to diagnose MRSA infection nor to guide or monitor treatment for MRSA infections. Test performance is not FDA approved in patients less than 39 years old. Performed at [facility], [address] [place], [place] [address]   Respiratory (~20 pathogens) panel by PCR     Status: Abnormal   Collection Time: 05/30/24  1:55 PM   Specimen: Nasopharyngeal Swab; Respiratory  Result Value Ref Range Status   Adenovirus NOT DETECTED NOT DETECTED Final    Coronavirus 229E NOT DETECTED NOT DETECTED Final    Comment: (NOTE) The Coronavirus on the Respiratory Panel, DOES NOT test for the novel  Coronavirus (2019 nCoV)    Coronavirus HKU1 NOT DETECTED NOT DETECTED Final   Coronavirus NL63 NOT DETECTED NOT DETECTED Final   Coronavirus OC43 NOT DETECTED NOT DETECTED Final   Metapneumovirus DETECTED (A) NOT DETECTED Final   Rhinovirus / Enterovirus DETECTED (A) NOT DETECTED Final   Influenza A NOT DETECTED NOT DETECTED Final   Influenza B NOT DETECTED NOT DETECTED Final   Parainfluenza Virus 1 NOT DETECTED NOT DETECTED Final   Parainfluenza Virus 2 NOT DETECTED NOT DETECTED Final   Parainfluenza Virus 3 NOT DETECTED NOT DETECTED Final   Parainfluenza Virus 4 NOT DETECTED NOT DETECTED Final   Respiratory Syncytial Virus NOT DETECTED NOT DETECTED Final   Bordetella pertussis NOT DETECTED NOT DETECTED Final   Bordetella Parapertussis NOT DETECTED NOT DETECTED Final   Chlamydophila pneumoniae NOT DETECTED NOT DETECTED Final   Mycoplasma pneumoniae NOT DETECTED NOT DETECTED Final    Comment: Performed at [facility], [address] [place], [place] [address]  Culture, blood (single) w Reflex to ID Panel     Status: None   Collection Time: 06/01/24  5:20 PM   Specimen: BLOOD  Result Value Ref Range Status   Specimen Description BLOOD BLOOD RIGHT HAND  Final   Special Requests   Final    BOTTLES DRAWN AEROBIC AND ANAEROBIC Blood Culture results may not be optimal due to an inadequate volume of blood received in culture bottles   Culture   Final    NO GROWTH 5 DAYS Performed at [facility], [address], [place], [place] [address]    Report Status 06/13/2024 FINAL  Final    Time spent: 30 minutes  Signed: Laneta Blunt, DO 06/09/2024

## 2024-06-23 DEATH — deceased
